# Patient Record
Sex: Female | Born: 1965 | Race: Black or African American | Hispanic: No | Marital: Married | Smoking: Former smoker
Health system: Southern US, Community
[De-identification: ages and names within clinical notes are randomized; demographics above are authoritative.]

## PROBLEM LIST (undated history)

## (undated) DIAGNOSIS — Z9221 Personal history of antineoplastic chemotherapy: Secondary | ICD-10-CM

## (undated) DIAGNOSIS — E119 Type 2 diabetes mellitus without complications: Secondary | ICD-10-CM

## (undated) DIAGNOSIS — Z923 Personal history of irradiation: Secondary | ICD-10-CM

## (undated) DIAGNOSIS — I10 Essential (primary) hypertension: Secondary | ICD-10-CM

## (undated) DIAGNOSIS — C801 Malignant (primary) neoplasm, unspecified: Secondary | ICD-10-CM

## (undated) DIAGNOSIS — I499 Cardiac arrhythmia, unspecified: Secondary | ICD-10-CM

## (undated) DIAGNOSIS — M1990 Unspecified osteoarthritis, unspecified site: Secondary | ICD-10-CM

## (undated) DIAGNOSIS — E669 Obesity, unspecified: Secondary | ICD-10-CM

## (undated) DIAGNOSIS — K5720 Diverticulitis of large intestine with perforation and abscess without bleeding: Secondary | ICD-10-CM

## (undated) HISTORY — DX: Essential (primary) hypertension: I10

## (undated) HISTORY — DX: Malignant (primary) neoplasm, unspecified: C80.1

## (undated) HISTORY — PX: BREAST CYST ASPIRATION: SHX578

## (undated) HISTORY — DX: Diverticulitis of large intestine with perforation and abscess without bleeding: K57.20

## (undated) HISTORY — DX: Obesity, unspecified: E66.9

## (undated) HISTORY — PX: ANKLE SURGERY: SHX546

---

## 2005-01-05 ENCOUNTER — Ambulatory Visit: Payer: Self-pay | Admitting: Obstetrics and Gynecology

## 2005-10-13 ENCOUNTER — Ambulatory Visit: Payer: Self-pay | Admitting: Obstetrics and Gynecology

## 2005-12-20 ENCOUNTER — Ambulatory Visit: Payer: Self-pay | Admitting: Obstetrics and Gynecology

## 2008-11-10 ENCOUNTER — Ambulatory Visit: Payer: Self-pay | Admitting: Internal Medicine

## 2010-12-13 ENCOUNTER — Ambulatory Visit: Payer: Self-pay | Admitting: Internal Medicine

## 2012-05-01 ENCOUNTER — Other Ambulatory Visit: Payer: Self-pay | Admitting: Radiology

## 2012-05-02 ENCOUNTER — Ambulatory Visit: Payer: Self-pay | Admitting: Internal Medicine

## 2012-11-07 ENCOUNTER — Emergency Department: Payer: Self-pay | Admitting: Emergency Medicine

## 2012-11-07 ENCOUNTER — Ambulatory Visit: Payer: Self-pay | Admitting: Family Medicine

## 2013-08-11 ENCOUNTER — Emergency Department: Payer: Self-pay | Admitting: Emergency Medicine

## 2014-11-20 LAB — HM PAP SMEAR: HM Pap smear: NEGATIVE

## 2015-11-10 ENCOUNTER — Encounter: Payer: Self-pay | Admitting: Family Medicine

## 2015-11-10 MED ORDER — TRIAMTERENE-HCTZ 37.5-25 MG PO TABS: 1.0000 | ORAL_TABLET | Freq: Every day | ORAL | Status: DC

## 2015-12-24 ENCOUNTER — Telehealth: Payer: Self-pay

## 2015-12-24 MED ORDER — TRIAMTERENE-HCTZ 37.5-25 MG PO TABS: 1.0000 | ORAL_TABLET | Freq: Every day | ORAL | Status: DC

## 2015-12-24 MED ORDER — AMLODIPINE BESYLATE 5 MG PO TABS: 5.0000 mg | ORAL_TABLET | Freq: Every day | ORAL | Status: DC

## 2015-12-24 NOTE — Telephone Encounter (Signed)
Patient called requesting refills on Amlodipine 5 mg qd and Triamterene-HCTZ 37.5-25 qd.  Patient has not been seen at [facility] since 02/26/2015.  I explained to patient that she would need appt to get med refills but she wanted me to send message first.  She uses [place]

## 2015-12-24 NOTE — Telephone Encounter (Signed)
Pt will need an appt for refills but I have given 30 day supply. Thanks! AK

## 2015-12-24 NOTE — Telephone Encounter (Signed)
Left message for patient that med were refilled for 1 month and she will need to make appt. A.Darneshia Demary

## 2016-01-05 ENCOUNTER — Ambulatory Visit (INDEPENDENT_AMBULATORY_CARE_PROVIDER_SITE_OTHER): Payer: Self-pay | Admitting: Family Medicine

## 2016-01-05 VITALS — BP 137/67 | HR 90 | Temp 98.7°F | Resp 16 | Ht 67.0 in | Wt 266.2 lb

## 2016-01-05 DIAGNOSIS — E785 Hyperlipidemia, unspecified: Secondary | ICD-10-CM

## 2016-01-05 DIAGNOSIS — I10 Essential (primary) hypertension: Secondary | ICD-10-CM

## 2016-01-05 DIAGNOSIS — J301 Allergic rhinitis due to pollen: Secondary | ICD-10-CM

## 2016-01-05 MED ORDER — FLUTICASONE PROPIONATE 50 MCG/ACT NA SUSP: 2.0000 | Freq: Every day | NASAL | Status: DC

## 2016-01-05 MED ORDER — AMLODIPINE BESYLATE 5 MG PO TABS: 5.0000 mg | ORAL_TABLET | Freq: Every day | ORAL | Status: DC

## 2016-01-05 MED ORDER — TRIAMTERENE-HCTZ 37.5-25 MG PO TABS: 1.0000 | ORAL_TABLET | Freq: Every day | ORAL | Status: DC

## 2016-01-05 NOTE — Patient Instructions (Signed)
Your goal blood pressure is 140/90 Work on low salt/sodium diet - goal <1.5gm (1,500mg) per day. Eat a diet high in fruits/vegetables and whole grains.  Look into mediterranean and DASH diet. Goal activity is 150min/wk of moderate intensity exercise.  This can be split into 30 minute chunks.  If you are not at this level, you can start with smaller 10-15 min increments and slowly build up activity. Look at www.heart.org for more resources  Please seek immediate medical attention at ER or Urgent Care if you develop: Chest pain, pressure or tightness. Shortness of breath accompanied by nausea or diaphoresis Visual changes Numbness or tingling on one side of the body Facial droop Altered mental status Or any concerning symptoms.   

## 2016-01-05 NOTE — Assessment & Plan Note (Signed)
Controlled. Renewed medications. Check CMET. Dash diet reviewed. Encouraged daily exercise. Recheck 6 mos.

## 2016-01-05 NOTE — Progress Notes (Signed)
Subjective:    Patient ID: Chelsea Hudson, female    DOB: 19-Mar-1966, 50 y.o.   MRN: [id number]  HPI: Chelsea Hudson is a 50 y.o. female presenting on 01/05/2016 for Hypertension   HPI  Pt presents for hypertension recheck. Needs refills on her medications. Doing well at home. No HA, SOB, or visual changes. Doing well with medications. Does not check BP at home.  Pt also reporting sinus drainage and increasing loss of taste over the past year. A family member had surgery on her sinuses to correct this- pt is wondering if she is a candidate. Pt does not take flonase on a regular basis.    No past medical history on file.  No current outpatient prescriptions on file prior to visit.   No current facility-administered medications on file prior to visit.    Review of Systems  Constitutional: Negative for fever and chills.  HENT: Negative.   Respiratory: Negative for cough, chest tightness and wheezing.   Cardiovascular: Negative for chest pain and leg swelling.  Gastrointestinal: Negative for nausea, vomiting, abdominal pain, diarrhea and constipation.  Endocrine: Negative.  Negative for cold intolerance, heat intolerance, polydipsia, polyphagia and polyuria.  Genitourinary: Negative for dysuria and difficulty urinating.  Musculoskeletal: Negative.   Neurological: Negative for dizziness, light-headedness and numbness.  Psychiatric/Behavioral: Negative.    Per HPI unless specifically indicated above     Objective:    BP 137/67 mmHg  Pulse 90  Temp(Src) 98.7 F (37.1 C) (Oral)  Resp 16  Ht 5\' 7"  (1.702 m)  Wt 266 lb 3.2 oz (120.748 kg)  BMI 41.68 kg/m2  SpO2 100%  Wt Readings from Last 3 Encounters:  01/05/16 266 lb 3.2 oz (120.748 kg)    Physical Exam  Constitutional: She is oriented to person, place, and time. She appears well-developed and well-nourished.  HENT:  Head: Normocephalic and atraumatic.  Neck: Neck supple.  Cardiovascular: Normal rate, regular rhythm  and normal heart sounds.  Exam reveals no gallop and no friction rub.   No murmur heard. Pulmonary/Chest: Effort normal and breath sounds normal. She has no wheezes. She exhibits no tenderness.  Abdominal: Soft. Normal appearance and bowel sounds are normal. She exhibits no distension and no mass. There is no tenderness. There is no rebound and no guarding.  Musculoskeletal: Normal range of motion. She exhibits no edema or tenderness.  Lymphadenopathy:    She has no cervical adenopathy.  Neurological: She is alert and oriented to person, place, and time.  Skin: Skin is warm and dry.   Results for orders placed or performed in visit on 01/05/16  HM PAP SMEAR  Result Value Ref Range   HM Pap smear negative       Assessment & Plan:   Problem List Items Addressed This Visit      Cardiovascular and Mediastinum   Hypertension - Primary    Controlled. Renewed medications. Check CMET. Dash diet reviewed. Encouraged daily exercise. Recheck 6 mos.       Relevant Medications   triamterene-hydrochlorothiazide (MAXZIDE-25) 37.5-25 MG tablet   amLODipine (NORVASC) 5 MG tablet   Other Relevant Orders   Comprehensive Metabolic Panel (CMET)    Other Visit Diagnoses    Mild hyperlipidemia        Relevant Medications    triamterene-hydrochlorothiazide (MAXZIDE-25) 37.5-25 MG tablet    amLODipine (NORVASC) 5 MG tablet    Other Relevant Orders    Lipid Profile    Allergic rhinitis due to pollen  Trial of flonase for sinus symptoms. Consider referral to ENT if symptoms not improving.     Relevant Medications    fluticasone (FLONASE) 50 MCG/ACT nasal spray       Meds ordered this encounter  Medications  . triamterene-hydrochlorothiazide (MAXZIDE-25) 37.5-25 MG tablet    Sig: Take 1 tablet by mouth daily.    Dispense:  90 tablet    Refill:  3    Patient must schedule office visit no refills. Last office visit 02/2015.    Order Specific Question:  Supervising Provider    Answer:   Arlis Porta [id number]  . amLODipine (NORVASC) 5 MG tablet    Sig: Take 1 tablet (5 mg total) by mouth daily.    Dispense:  90 tablet    Refill:  3    Order Specific Question:  Supervising Provider    Answer:  Arlis Porta [phone]  . fluticasone (FLONASE) 50 MCG/ACT nasal spray    Sig: Place 2 sprays into both nostrils daily.    Dispense:  16 g    Refill:  11    Order Specific Question:  Supervising Provider    Answer:  Arlis Porta [phone]      Follow up plan: Return in about 6 months (around 07/04/2016) for HTN.

## 2016-02-10 ENCOUNTER — Telehealth: Payer: Self-pay

## 2016-02-10 NOTE — Telephone Encounter (Signed)
Called to request weight loss info gave [employer] info. [facility]

## 2016-04-21 ENCOUNTER — Ambulatory Visit (INDEPENDENT_AMBULATORY_CARE_PROVIDER_SITE_OTHER): Payer: Self-pay | Admitting: Obstetrics and Gynecology

## 2016-04-21 ENCOUNTER — Encounter: Payer: Self-pay | Admitting: Obstetrics and Gynecology

## 2016-04-21 VITALS — BP 130/77 | HR 91 | Ht 66.0 in | Wt 265.0 lb

## 2016-04-21 DIAGNOSIS — E669 Obesity, unspecified: Secondary | ICD-10-CM

## 2016-04-21 MED ORDER — PHENTERMINE HCL 37.5 MG PO TABS: 37.5000 mg | ORAL_TABLET | Freq: Every day | ORAL | Status: DC

## 2016-04-21 MED ORDER — CYANOCOBALAMIN 1000 MCG/ML IJ SOLN: 1000.0000 ug | Freq: Once | INTRAMUSCULAR | Status: DC

## 2016-04-21 NOTE — Progress Notes (Signed)
Subjective:  Chelsea Hudson is a 50 y.o. G0P0 at Unknown being seen today for weight loss management- initial visit.  Patient reports General ROS: negative and reports previous weight loss attempts: exercising at the gym and lost 50# and has kept it off. Works as a dump Training and development officer.   The following portions of the patient's history were reviewed and updated as appropriate: allergies, current medications, past family history, past medical history, past social history, past surgical history and problem list.   Objective:   Filed Vitals:   04/21/16 1510  BP: 130/77  Pulse: 91  Height: 5\' 6"  (1.676 m)  Weight: 265 lb (120.203 kg)    General:  Alert, oriented and cooperative. Patient is in no acute distress.  :   :   :   :   :   :   PE: Well groomed female in no current distress,   Mental Status: Normal mood and affect. Normal behavior. Normal judgment and thought content.   Current BMI: Body mass index is 42.79 kg/(m^2).   Assessment and Plan:  Obesity  1. Obesity   Plan: low carb, High protein diet RX for adipex 37.5 mg daily and B12 1068mcg.ml monthly, to start now with first injection given at today's visit. Reviewed side-effects common to both medications and expected outcomes. Increase daily water intake to at least 8 bottle a day, every day.  Goal is to reduse weight by 10% by end of three months, and will re-evaluate then.  RTC in 4 weeks for Nurse visit to check weight & BP, and get next B12 injections.    Please refer to After Visit Summary for other counseling recommendations.    Brelyn Woehl N Morris, CNM   Payal Stanforth [employer], CNM      Consider the Low Glycemic Index Diet and 6 smaller meals daily .  This boosts your metabolism and regulates your sugars:   Use the protein bar by [employer]kins because they have lots of fiber in them  Find the low carb flatbreads, tortillas and pita breads for sandwiches:  Joseph's makes a pita bread and a flat bread  , available at [facility] and [employer]; [place] makes a low carb flatbread available at [employer] and HT that is 9 net carbs and 100 cal [place] makes a low carb whole wheat tortilla available at [employer] most grocery stores with 6 net carbs and 210 cal  [place] yogurt can still have a lot of carbs .  Dannon Light N fit has 80 cal and 8 carbs

## 2016-05-19 ENCOUNTER — Ambulatory Visit (INDEPENDENT_AMBULATORY_CARE_PROVIDER_SITE_OTHER): Payer: Self-pay | Admitting: Obstetrics and Gynecology

## 2016-05-19 VITALS — BP 119/84 | HR 92 | Wt 254.6 lb

## 2016-05-19 DIAGNOSIS — E669 Obesity, unspecified: Secondary | ICD-10-CM

## 2016-05-19 MED ORDER — CYANOCOBALAMIN 1000 MCG/ML IJ SOLN
1000.0000 ug | Freq: Once | INTRAMUSCULAR | Status: AC
  Administered 2016-05-19: 1000 ug via INTRAMUSCULAR

## 2016-05-19 NOTE — Progress Notes (Signed)
Pt is here for wt, bp check, b-12 inj Pt has done awesome, she is very pleased with her weight loss, denies any s/e  04/21/16 wt- 254.6

## 2016-06-16 ENCOUNTER — Ambulatory Visit (INDEPENDENT_AMBULATORY_CARE_PROVIDER_SITE_OTHER): Payer: Self-pay | Admitting: Obstetrics and Gynecology

## 2016-06-16 VITALS — BP 116/88 | HR 108 | Ht 66.0 in | Wt 247.6 lb

## 2016-06-16 DIAGNOSIS — R5383 Other fatigue: Secondary | ICD-10-CM

## 2016-06-16 DIAGNOSIS — E669 Obesity, unspecified: Secondary | ICD-10-CM

## 2016-06-16 MED ORDER — CYANOCOBALAMIN 1000 MCG/ML IJ SOLN
1000.0000 ug | Freq: Once | INTRAMUSCULAR | Status: AC
  Administered 2016-06-16: 1000 ug via INTRAMUSCULAR

## 2016-06-19 NOTE — Progress Notes (Signed)
Filed Weights   06/16/16 1640  Weight: 247 lb 9.6 oz (112.3 kg)   Pt presents for weight management with Adipex, pt is doing well and only notes dry mouth as a side effect. Pt day with a weight loss of 8lbs. Encouraged pt to keep up the good work, make healthy food choices, and add exercise. Pt to RTC in 1 month.

## 2016-07-21 ENCOUNTER — Ambulatory Visit (INDEPENDENT_AMBULATORY_CARE_PROVIDER_SITE_OTHER): Payer: Self-pay | Admitting: Obstetrics and Gynecology

## 2016-07-21 VITALS — BP 131/83 | HR 93 | Ht 66.0 in | Wt 243.0 lb

## 2016-07-21 DIAGNOSIS — E669 Obesity, unspecified: Secondary | ICD-10-CM

## 2016-07-21 DIAGNOSIS — R5383 Other fatigue: Secondary | ICD-10-CM

## 2016-07-21 MED ORDER — CYANOCOBALAMIN 1000 MCG/ML IJ SOLN
1000.0000 ug | Freq: Once | INTRAMUSCULAR | Status: AC
  Administered 2016-07-21: 1000 ug via INTRAMUSCULAR

## 2016-07-21 NOTE — Patient Instructions (Signed)

## 2016-07-21 NOTE — Progress Notes (Signed)
Filed Weights   07/21/16 1647  Weight: 243 lb (110.2 kg)   Pt presents for weight management, pt today with 4LB weight loss. Pt denies complaints or side effects of medication. Pt states that she is hoping to begin exercising soon. Encouraged pt to do so, begin with brisk walk and increase. Pt to follow up in 4wks for next appointment.

## 2016-08-18 ENCOUNTER — Ambulatory Visit (INDEPENDENT_AMBULATORY_CARE_PROVIDER_SITE_OTHER): Payer: Self-pay | Admitting: Obstetrics and Gynecology

## 2016-08-18 VITALS — BP 119/79 | HR 98 | Ht 66.0 in | Wt 244.0 lb

## 2016-08-18 DIAGNOSIS — R5383 Other fatigue: Secondary | ICD-10-CM

## 2016-08-18 DIAGNOSIS — E669 Obesity, unspecified: Secondary | ICD-10-CM

## 2016-08-18 MED ORDER — CYANOCOBALAMIN 1000 MCG/ML IJ SOLN
1000.0000 ug | Freq: Once | INTRAMUSCULAR | Status: AC
  Administered 2016-08-18: 1000 ug via INTRAMUSCULAR

## 2016-08-18 MED ORDER — PHENTERMINE HCL 37.5 MG PO TABS: 37.5000 mg | ORAL_TABLET | Freq: Every day | ORAL | 2 refills | Status: DC

## 2016-08-18 NOTE — Progress Notes (Signed)
Patient ID: Chelsea Hudson, female   DOB: 10-24-1966, 50 y.o.   MRN: [id number]  Pt here to be evaluated for restarting phentermine. Has been off of medication x1 month and gained 1 lb. Was previously doing well losing weight on phentermine. MNS agreed to restart phentermine, rx given with 2 refills.

## 2016-09-15 ENCOUNTER — Ambulatory Visit: Payer: Self-pay

## 2016-09-18 ENCOUNTER — Ambulatory Visit (INDEPENDENT_AMBULATORY_CARE_PROVIDER_SITE_OTHER): Payer: Self-pay | Admitting: Obstetrics and Gynecology

## 2016-09-18 VITALS — BP 124/83 | HR 93 | Ht 66.0 in | Wt 239.8 lb

## 2016-09-18 DIAGNOSIS — E6609 Other obesity due to excess calories: Secondary | ICD-10-CM

## 2016-09-18 MED ORDER — CYANOCOBALAMIN 1000 MCG/ML IJ SOLN
1000.0000 ug | Freq: Once | INTRAMUSCULAR | Status: AC
  Administered 2016-09-18: 1000 ug via INTRAMUSCULAR

## 2016-09-18 NOTE — Progress Notes (Signed)
Pt presents for wt, bp, and b12. She is down 5#. NO s/e. F/u with MNS in 4 weeks.

## 2016-10-19 ENCOUNTER — Ambulatory Visit (INDEPENDENT_AMBULATORY_CARE_PROVIDER_SITE_OTHER): Payer: Self-pay | Admitting: Obstetrics and Gynecology

## 2016-10-19 VITALS — BP 113/87 | HR 98 | Ht 66.0 in | Wt 237.4 lb

## 2016-10-19 DIAGNOSIS — Z6838 Body mass index (BMI) 38.0-38.9, adult: Secondary | ICD-10-CM

## 2016-10-19 DIAGNOSIS — E6609 Other obesity due to excess calories: Secondary | ICD-10-CM

## 2016-10-19 MED ORDER — CYANOCOBALAMIN 1000 MCG/ML IJ SOLN
1000.0000 ug | Freq: Once | INTRAMUSCULAR | Status: AC
  Administered 2016-10-19: 1000 ug via INTRAMUSCULAR

## 2016-10-19 NOTE — Progress Notes (Signed)
Patient ID: Chelsea Hudson, female   DOB: 03-05-1966, 50 y.o.   MRN: [id number]  Pt presents for weight, B/P, B-12 injection. No side effects of medication-Phentermine, or B-12.  Weight loss of _2_ lbs. Encouraged eating healthy and exercise.

## 2016-11-20 DIAGNOSIS — C50919 Malignant neoplasm of unspecified site of unspecified female breast: Secondary | ICD-10-CM

## 2016-11-20 HISTORY — DX: Malignant neoplasm of unspecified site of unspecified female breast: C50.919

## 2016-11-23 ENCOUNTER — Encounter: Payer: Self-pay | Admitting: Obstetrics and Gynecology

## 2017-01-31 ENCOUNTER — Ambulatory Visit: Payer: Self-pay | Attending: Oncology

## 2017-01-31 VITALS — BP 121/80 | HR 91 | Temp 97.7°F

## 2017-01-31 DIAGNOSIS — N63 Unspecified lump in unspecified breast: Secondary | ICD-10-CM

## 2017-01-31 DIAGNOSIS — Z0000 Encounter for general adult medical examination without abnormal findings: Secondary | ICD-10-CM

## 2017-01-31 NOTE — Progress Notes (Addendum)
Subjective:     Patient ID: Chelsea Hudson, female   DOB: 1966-05-07, 51 y.o.   MRN: [id number]  HPI   Review of Systems     Objective:   Physical Exam  Pulmonary/Chest: Right breast exhibits inverted nipple and mass. Right breast exhibits no nipple discharge, no skin change and no tenderness. Left breast exhibits no inverted nipple, no mass, no nipple discharge, no skin change and no tenderness. Breasts are symmetrical.    Right nipple inversion, per patient normal  Genitourinary: No labial fusion. There is no rash, tenderness, lesion or injury on the right labia. There is no rash, tenderness, lesion or injury on the left labia. Uterus is not deviated, not enlarged, not fixed and not tender. Cervix exhibits friability. Cervix exhibits no motion tenderness and no discharge. Right adnexum displays no mass, no tenderness and no fullness. Left adnexum displays no mass, no tenderness and no fullness. No erythema, tenderness or bleeding in the vagina. No foreign body in the vagina. No signs of injury around the vagina. No vaginal discharge found.       Assessment:     51 year old patient presents for [facility] clinic visit.  Patient screened, and meets BCCCP eligibility.  Patient does not have insurance, Medicare or Medicaid.  Handout given on Affordable Care Act.Instructed patient on breast self-exam using teach back method.  Patient reports she has a right breast abnormality that she has noticed for past 3 days.  CBE reveals a linear firm 3.5 cm mass right upper breast with indentation.  Denies tenderness.  Pelvic exam normal.  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    Plan:     Jeanella Anton to schedule patient for bilateral diagnostic mammogram, and ultrasound .  Orders are in.  Specimen collected for pap.

## 2017-01-31 NOTE — Addendum Note (Signed)
Addended byAl Pimple F on: 01/31/2017 05:00 PM   Modules accepted: Orders

## 2017-02-02 LAB — PAP LB AND HPV HIGH-RISK
HPV, high-risk: POSITIVE — AB
PAP SMEAR COMMENT: 0

## 2017-02-09 ENCOUNTER — Ambulatory Visit
Admission: RE | Admit: 2017-02-09 | Discharge: 2017-02-09 | Disposition: A | Discharge status: Home / Self Care | Payer: Self-pay | Source: Ambulatory Visit | Attending: Oncology | Admitting: Oncology

## 2017-02-09 DIAGNOSIS — N63 Unspecified lump in unspecified breast: Secondary | ICD-10-CM

## 2017-02-12 ENCOUNTER — Other Ambulatory Visit: Payer: Self-pay | Admitting: *Deleted

## 2017-02-12 DIAGNOSIS — N63 Unspecified lump in unspecified breast: Secondary | ICD-10-CM

## 2017-02-13 ENCOUNTER — Other Ambulatory Visit: Payer: Self-pay | Admitting: *Deleted

## 2017-02-13 DIAGNOSIS — N63 Unspecified lump in unspecified breast: Secondary | ICD-10-CM

## 2017-02-28 ENCOUNTER — Ambulatory Visit
Admission: RE | Admit: 2017-02-28 | Discharge: 2017-02-28 | Disposition: A | Discharge status: Home / Self Care | Payer: Medicaid Other | Source: Ambulatory Visit | Attending: Oncology | Admitting: Oncology

## 2017-02-28 DIAGNOSIS — N63 Unspecified lump in unspecified breast: Secondary | ICD-10-CM

## 2017-02-28 DIAGNOSIS — C801 Malignant (primary) neoplasm, unspecified: Secondary | ICD-10-CM

## 2017-02-28 HISTORY — DX: Malignant (primary) neoplasm, unspecified: C80.1

## 2017-02-28 HISTORY — PX: BREAST BIOPSY: SHX20

## 2017-03-02 NOTE — Progress Notes (Signed)
Notified patient of postive breast biopsy results.  Scheduled her to see Dr. Bary Castilla 03/06/17 at 4:45.  To be scheduled for Oncology Consult.  Patient is coming in on 03/07/17 at 4:30 to fill out [facility] paperwork.  Phoned patient with appointment. Notified of negative /positive pap results , and need for repeat pap in one year.

## 2017-03-05 DIAGNOSIS — Z7189 Other specified counseling: Secondary | ICD-10-CM | POA: Insufficient documentation

## 2017-03-05 DIAGNOSIS — Z170 Estrogen receptor positive status [ER+]: Secondary | ICD-10-CM

## 2017-03-05 DIAGNOSIS — Z853 Personal history of malignant neoplasm of breast: Secondary | ICD-10-CM | POA: Insufficient documentation

## 2017-03-05 DIAGNOSIS — C50411 Malignant neoplasm of upper-outer quadrant of right female breast: Secondary | ICD-10-CM | POA: Insufficient documentation

## 2017-03-05 NOTE — Progress Notes (Signed)
  Oncology Nurse Navigator Documentation  Navigator Location: [facility] Onc (03/05/17 1600) Referral date to RadOnc/MedOnc: 03/06/17 (03/05/17 1600) )Navigator Encounter Type: Telephone (03/05/17 1600) Telephone: Lahoma Crocker Call;Appt Confirmation/Clarification;Education;Financial Assistance (03/05/17 1600)                     Treatment Phase: Pre-Tx/Tx Discussion (03/05/17 1600) Barriers/Navigation Needs: Coordination of Care (03/05/17 1600) Education: Accessing Care/ Finding Providers (03/05/17 1600) Interventions: Coordination of Care (03/05/17 1600)   Coordination of Care: Appts (03/05/17 1600)                  Time Spent with Patient: 60 (03/05/17 1600)   Phoned patient with Oncology Consult appointment options.  She is coming in on 03/06/17 at 0830 to see Dr. Grayland Ormond.  Breast Cancer Treatment HAndbook/folder with hospital services  given to [facility] RN to give to patient.  [place] paperwork prepared and given to [facility] for patient to sign.

## 2017-03-05 NOTE — Progress Notes (Signed)
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  INTERVAL HISTORY: Patient is a 51 year old female who noted an enlarging mass in her right breast. Subsequent mammogram, ultrasound, biopsy revealed the above stated breast cancer. She is anxious, but otherwise feels well. She has no neurologic complaints. She denies any recent fevers or illnesses. She has a good appetite and denies weight loss. She has no chest pain or shortness of breath. She denies any nausea, vomiting, constipation, or diarrhea. She has no urinary complaints. Patient otherwise feels well and offers no further specific complaints.  REVIEW OF SYSTEMS:   Review of Systems  Constitutional: Negative.  Negative for fever, malaise/fatigue and weight loss.  Respiratory: Negative.  Negative for cough.   Cardiovascular: Negative.  Negative for chest pain and leg swelling.  Gastrointestinal: Negative.  Negative for abdominal pain.  Genitourinary: Negative.   Musculoskeletal: Negative.   Skin: Negative.  Negative for rash.  Neurological: Negative.  Negative for weakness.  Psychiatric/Behavioral: The patient is nervous/anxious.     As per HPI. Otherwise, a complete review of systems is negative.  PAST MEDICAL HISTORY: Past Medical History:  Diagnosis Date  . Arthritis    SHOULDER  . Cancer ([place]) 02/28/2017   INVASIVE MAMMARY CARCINOMA WITH MUCINOUS FEATURES.  . Hypertension   . Irregular heart beat    PT STATES IT "SKIPS A BEAT"   . Obesity     PAST SURGICAL HISTORY: Past Surgical History:  Procedure  Laterality Date  . ANKLE SURGERY    . BREAST BIOPSY Right 02/28/2017   INVASIVE MAMMARY CARCINOMA WITH MUCINOUS FEATURES.  Marland Kitchen BREAST CYST ASPIRATION Right    NEG    FAMILY HISTORY: Family History  Problem Relation Age of Onset  . Diabetes Father   . Stroke Father   . Breast cancer Mother 76  . Brain cancer Maternal Aunt 60  . Colon cancer Neg Hx     ADVANCED DIRECTIVES (Y/N):  N  HEALTH MAINTENANCE: Social History  Substance Use Topics  . Smoking status: Current Every Day Smoker    Packs/day: 0.50    Years: 25.00    Types: Cigarettes  . Smokeless tobacco: Never Used  . Alcohol use Yes     Comment: occas     Colonoscopy:  PAP:  Bone density:  Lipid panel:  Allergies  Allergen Reactions  . Other Hives and Itching    [name]     Current Outpatient Prescriptions  Medication Sig Dispense Refill  . amLODipine (NORVASC) 5 MG tablet Take 1 tablet (5 mg total) by mouth daily. (Patient taking differently: Take 5 mg by mouth every morning. ) 90 tablet 3  . triamterene-hydrochlorothiazide (MAXZIDE-25) 37.5-25 MG tablet Take 1 tablet by mouth daily. 90 tablet 3  . acetaminophen (TYLENOL) 500 MG tablet Take 1,000 mg by mouth every 6 (six) hours as needed for mild pain.     No current facility-administered medications for this visit.     OBJECTIVE: Vitals:   03/06/17 0834  BP: Marland Kitchen)  147/95  Pulse: 80  Resp: 18  Temp: 98.6 F (37 C)     Body mass index is 40.43 kg/m.    ECOG FS:0 - Asymptomatic  General: Well-developed, well-nourished, no acute distress. Eyes: Pink conjunctiva, anicteric sclera. HEENT: Normocephalic, moist mucous membranes, clear oropharnyx. Breasts: Patient requested exam be deferred today. Lungs: Clear to auscultation bilaterally. Heart: Regular rate and rhythm. No rubs, murmurs, or gallops. Abdomen: Soft, nontender, nondistended. No organomegaly  noted, normoactive bowel sounds. Musculoskeletal: No edema, cyanosis, or clubbing. Neuro: Alert, answering all questions appropriately. Cranial nerves grossly intact. Skin: No rashes or petechiae noted. Psych: Normal affect. Lymphatics: No cervical, calvicular, axillary or inguinal LAD.   LAB RESULTS:  No results found for: NA, K, CL, CO2, GLUCOSE, BUN, CREATININE, CALCIUM, PROT, ALBUMIN, AST, ALT, ALKPHOS, BILITOT, GFRNONAA, GFRAA  No results found for: WBC, NEUTROABS, HGB, HCT, MCV, PLT   STUDIES: Nm Pet Image Initial (pi) Skull Base To Thigh  Result Date: 03/09/2017 CLINICAL DATA:  Initial treatment strategy for right breast cancer. EXAM: NUCLEAR MEDICINE PET SKULL BASE TO THIGH TECHNIQUE: 12.4 mCi F-18 FDG was injected intravenously. Full-ring PET imaging was performed from the skull base to thigh after the radiotracer. CT data was obtained and used for attenuation correction and anatomic localization. FASTING BLOOD GLUCOSE:  Value: 118 mg/dl COMPARISON:  Multiple exams, including CT abdomen from 05/02/2012 FINDINGS: NECK No hypermetabolic lymph nodes in the neck. Mild chronic left maxillary sinusitis. Low-level fairly symmetric activity in the palate and tonsils is highly likely to be physiologic. CHEST Hypermetabolic right axillary adenopathy. Index right axillary node 2.3 cm in short axis on image 67/3, maximum SUV 24.0. A separate right axillary/subpectoral lymph node adjacent to the axillary neurovascular structures measures 1.2 cm in short axis on image 51/ 3 and has a maximum SUV of 6.0, compatible with malignant involvement. There are other small subpectoral lymph nodes on the right which only have low-grade activity but merit surveillance. In the upper portion of the right breast there some faint bandlike opacity adjacent to a small clip, maximum SUV in this vicinity 3.7, likely a postoperative region. No obvious worrisome lung nodules on the CT data. Upper normal size paratracheal and  AP window lymph nodes do not demonstrate metabolic activity above the blood pool. ABDOMEN/PELVIS No abnormal hypermetabolic activity within the liver, pancreas, adrenal glands, or spleen. No hypermetabolic lymph nodes in the abdomen or pelvis. A 9 cm segment of the sigmoid colon demonstrates mildly accentuated metabolic activity, particularly proximally where the maximum SUV is 8.9. There is evidence of scattered diverticula and possibly some subtle inflammation along its proximal region, for example images 200-207 of series 3. This may reflect mild diverticulitis but sigmoid colon tumor is not specifically excluded given this constellation of findings. Small retroperitoneal and pelvic lymph nodes are not pathologically enlarged or hypermetabolic. SKELETON No focal hypermetabolic activity to suggest skeletal metastasis. IMPRESSION: 1. Hypermetabolic right axillary/subpectoral adenopathy. Low-grade activity in the right upper breast likely at the postoperative site. Appearance compatible with metastatic spread to right axillary/subpectoral lymph nodes. 2. There is some sigmoid colon diverticulosis, with faint inflammatory findings adjacent to the sigmoid colon proximally, and with accentuated activity in the involved segment of the sigmoid colon. This is probably inflammatory activity. Strictly speaking, malignancy in the sigmoid colon could have a similar appearance. Following any therapy for potential mild diverticulitis, consider colonoscopy if not recently performed in order to further assess the sigmoid colon. 3.  Aortic Atherosclerosis (ICD10-I70.0). 4. Mild chronic left maxillary  sinusitis. Electronically Signed   By: Van Clines M.D.   On: 03/09/2017 10:03   Mm Clip Placement Right  Result Date: 02/28/2017 CLINICAL DATA:  Status post ultrasound-guided biopsy of a right breast mass at 11 o'clock and of a right axillary lymph nodes EXAM: DIAGNOSTIC RIGHT MAMMOGRAM POST ULTRASOUND BIOPSY COMPARISON:   Previous exam(s). FINDINGS: Mammographic images were obtained following ultrasound guided biopsy of a suspicious right breast mass at 11 o'clock and of a right axillary lymph node. Post biopsy mammogram demonstrates the wing shaped biopsy marker to be within the right breast mass at 11 o'clock and the [facility] shape 4 butterfly marker to be in the expected location of the enlarged right axillary lymph node. IMPRESSION: Appropriate marker position as above. Final Assessment: Post Procedure Mammograms for Marker Placement Electronically Signed   By: Pamelia Hoit M.D.   On: 02/28/2017 09:22   [place] Rt Breast Bx W Loc Dev 1st Lesion Img Bx Spec US Guide  Result Date: 02/28/2017 CLINICAL DATA:  51 year old female with suspicious right breast mass at 11 o'clock, 8 cm from the nipple EXAM: ULTRASOUND GUIDED RIGHT BREAST CORE NEEDLE BIOPSY COMPARISON:  Previous exam(s). FINDINGS: I met with the patient and we discussed the procedure of ultrasound-guided biopsy, including benefits and alternatives. We discussed the high likelihood of a successful procedure. We discussed the risks of the procedure, including infection, bleeding, tissue injury, clip migration, and inadequate sampling. Informed written consent was given. The usual time-out protocol was performed immediately prior to the procedure. Lesion quadrant: Upper outer quadrant Using sterile technique and 1% Lidocaine as local anesthetic, under direct ultrasound visualization, a 12 gauge spring-loaded device was used to perform biopsy of a suspicious right breast mass at 11 o'clock, 8 cm from the nipple using a lateral to medial approach. At the conclusion of the procedure a wing shaped tissue marker clip was deployed into the biopsy cavity. Follow up 2 view mammogram was performed and dictated separately. IMPRESSION: Ultrasound guided biopsy of a suspicious right breast mass at 11 o'clock, 8 cm from the nipple. No apparent complications. Electronically Signed   By: Pamelia Hoit M.D.   On: 02/28/2017 09:19   [place] Rt Breast Bx W Loc Dev Ea Add Lesion Img Bx Spec US Guide  Result Date: 02/28/2017 CLINICAL DATA:  51 year old female with suspicious right axillary lymph node EXAM: ULTRASOUND GUIDED CORE NEEDLE BIOPSY OF A RIGHT AXILLARY NODE COMPARISON:  Previous exam(s). FINDINGS: I met with the patient and we discussed the procedure of ultrasound-guided biopsy, including benefits and alternatives. We discussed the high likelihood of a successful procedure. We discussed the risks of the procedure, including infection, bleeding, tissue injury, clip migration, and inadequate sampling. Informed written consent was given. The usual time-out protocol was performed immediately prior to the procedure. Using sterile technique and 1% Lidocaine as local anesthetic, under direct ultrasound visualization, a 14 gauge spring-loaded device was used to perform biopsy of a suspicious right axillary lymph node using a lateral to medial approach. At the conclusion of the procedure a HydroMARK shape 4 butterfly tissue marker clip was deployed into the biopsy cavity. Follow up 2 view mammogram was performed and dictated separately. IMPRESSION: Ultrasound guided biopsy of a suspicious right axillary lymph node. No apparent complications. Electronically Signed   By: Pamelia Hoit M.D.   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  PLAN:  1. 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 Her regimen will be determined later once her HER-2 results are finalized. PET scan completed after patient's appointment revealed no evidence of metastatic disease. Patient will require MUGA scan prior to initiating  treatment. After the completion of her neoadjuvant chemotherapy, she will require definitive surgery and possibly adjuvant XRT. Finally, given the ER/PR status of her tumor she will benefit from either tamoxifen or an aromatase inhibitor for total 5 years. Return to clinic on Mar 21, 2017 to initiate cycle 1 of neoadjuvant chemotherapy. 2. Genetic testing: Given patient's age of under 89, will order genetic testing at patient's next clinic visit.  Patient expressed understanding and was in agreement with this plan. She also understands that She can call clinic at any time with any questions, concerns, or complaints.   Cancer Staging Malignant neoplasm of upper-outer quadrant of right breast in female, estrogen receptor positive ([place]) Staging form: Breast, AJCC 8th Edition - Clinical stage from 03/05/2017: Stage IIA (cT2, cN2a, cM0, G2, ER: Positive, PR: Positive, HER2: Equivocal) - Signed by Lloyd Huger, MD on 03/05/2017   Lloyd Huger, MD   03/11/2017 10:50 PM

## 2017-03-05 NOTE — Progress Notes (Signed)
  Oncology Nurse Navigator Documentation  Navigator Location: [facility] (03/02/17 1625)   )Navigator Encounter Type: Introductory phone call (03/02/17 1625)   Abnormal Finding Date: 02/09/17 (03/02/17 1625) Confirmed Diagnosis Date: 02/28/17 (03/02/17 1625)               Patient Visit Type: Initial (03/02/17 1625) Treatment Phase: Pre-Tx/Tx Discussion (03/02/17 1625) Barriers/Navigation Needs: Education;Coordination of Care (03/02/17 1625) Education: Accessing Care/ Finding Providers;Understanding Cancer/ Treatment Options;Coping with Diagnosis/ Prognosis;Newly Diagnosed Cancer Education (03/02/17 1625) Interventions: Coordination of Care (03/02/17 1625)   Coordination of Care: Appts (03/02/17 1625)        Acuity: Level 2 (03/02/17 1625)   Acuity Level 2: Initial guidance, education and coordination as needed;Educational needs;Assistance expediting appointments (03/02/17 1625)  Patient screened through [facility].  Notified of positive pathology results.  Explained need for surgical, and oncology consultations.  Appointment schedule with Dr. Bary Castilla on 03/06/17 at 4:45.  Waiting to hear from scheduler on oncology consult.  Reviewed BCCM coverage with patient.  She is to come in to fill out paperwork  Wednesday afternoon 03/07/17.

## 2017-03-06 ENCOUNTER — Inpatient Hospital Stay: Payer: Medicaid Other | Attending: Oncology | Admitting: Oncology

## 2017-03-06 ENCOUNTER — Ambulatory Visit (INDEPENDENT_AMBULATORY_CARE_PROVIDER_SITE_OTHER): Payer: Medicaid Other | Admitting: General Surgery

## 2017-03-06 ENCOUNTER — Encounter: Payer: Self-pay | Admitting: General Surgery

## 2017-03-06 VITALS — BP 144/86 | HR 86 | Resp 14 | Ht 66.0 in | Wt 250.0 lb

## 2017-03-06 DIAGNOSIS — F17210 Nicotine dependence, cigarettes, uncomplicated: Secondary | ICD-10-CM | POA: Diagnosis not present

## 2017-03-06 DIAGNOSIS — Z170 Estrogen receptor positive status [ER+]: Secondary | ICD-10-CM | POA: Diagnosis not present

## 2017-03-06 DIAGNOSIS — K5730 Diverticulosis of large intestine without perforation or abscess without bleeding: Secondary | ICD-10-CM

## 2017-03-06 DIAGNOSIS — J320 Chronic maxillary sinusitis: Secondary | ICD-10-CM | POA: Insufficient documentation

## 2017-03-06 DIAGNOSIS — C50411 Malignant neoplasm of upper-outer quadrant of right female breast: Secondary | ICD-10-CM | POA: Insufficient documentation

## 2017-03-06 DIAGNOSIS — C773 Secondary and unspecified malignant neoplasm of axilla and upper limb lymph nodes: Secondary | ICD-10-CM | POA: Insufficient documentation

## 2017-03-06 DIAGNOSIS — M1990 Unspecified osteoarthritis, unspecified site: Secondary | ICD-10-CM | POA: Insufficient documentation

## 2017-03-06 DIAGNOSIS — I499 Cardiac arrhythmia, unspecified: Secondary | ICD-10-CM | POA: Insufficient documentation

## 2017-03-06 DIAGNOSIS — Z006 Encounter for examination for normal comparison and control in clinical research program: Secondary | ICD-10-CM | POA: Insufficient documentation

## 2017-03-06 DIAGNOSIS — Z79899 Other long term (current) drug therapy: Secondary | ICD-10-CM | POA: Diagnosis not present

## 2017-03-06 DIAGNOSIS — Z7189 Other specified counseling: Secondary | ICD-10-CM

## 2017-03-06 DIAGNOSIS — I10 Essential (primary) hypertension: Secondary | ICD-10-CM | POA: Diagnosis not present

## 2017-03-06 DIAGNOSIS — I700 Atherosclerosis of aorta: Secondary | ICD-10-CM | POA: Diagnosis not present

## 2017-03-06 NOTE — Patient Instructions (Addendum)

## 2017-03-06 NOTE — Progress Notes (Signed)
New eval for breast cancer. Complains of mild gas pains this morning.

## 2017-03-06 NOTE — Progress Notes (Signed)
Patient ID: Chelsea Hudson, female   DOB: [birth year], 51 y.o.   MRN: [id number]  Chief Complaint  Patient presents with  . Breast Problem    HPI Chelsea Hudson is a 51 y.o. female.  who presents for a breast evaluation. The most recent mammogram and biopsy was done on 02-28-17 . She states she first noticed a knot in the right breast about the middle of March. The patient reports that she is had lumpy breasts for many years, and only recently should she become aware of a notable change in the right breast. She states Chelsea Pimple RN with [facility] called her with the results on Friday showing cancer. She saw Dr Chelsea Hudson this morning. She states she will need chemotherapy that it is a stage 2 cancer. She states the area itches. Patient does not perform regular self breast checks and does not get regular mammograms done.   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 She is her with her aunt, Chelsea Hudson and sister Chelsea Hudson.  HPI  Past Medical History:  Diagnosis Date  . Cancer ([place]) 02/28/2017   INVASIVE MAMMARY CARCINOMA WITH MUCINOUS FEATURES.  . Hypertension   . Obesity     Past Surgical History:  Procedure Laterality Date  . ANKLE SURGERY    . BREAST BIOPSY Right 02/28/2017   INVASIVE MAMMARY CARCINOMA WITH MUCINOUS FEATURES.  Marland Kitchen BREAST CYST ASPIRATION Right    NEG    Family History  Problem Relation Age of Onset  . Diabetes Father   . Stroke Father   . Breast cancer Mother 31  . Brain cancer Maternal Aunt 60  . Colon cancer Neg Hx     Social History Social History  Substance Use Topics  . Smoking status: Current Every Day Smoker    Packs/day: 0.50    Years: 25.00    Types: Cigarettes  . Smokeless tobacco: Never Used  . Alcohol use Yes     Comment: occas    No Known Allergies  Current Outpatient Prescriptions  Medication Sig Dispense Refill  . amLODipine (NORVASC) 5 MG tablet Take 1 tablet (5 mg total) by mouth daily. 90 tablet 3  .  triamterene-hydrochlorothiazide (MAXZIDE-25) 37.5-25 MG tablet Take 1 tablet by mouth daily. 90 tablet 3   No current facility-administered medications for this visit.     Review of Systems Review of Systems  Constitutional: Negative.   Respiratory: Negative.   Cardiovascular: Negative.     Blood pressure (!) 144/86, pulse 86, resp. rate 14, height 5' 6"  (1.676 m), weight 250 lb (113.4 kg), last menstrual period 01/31/2014.  Physical Exam Physical Exam  Constitutional: She is oriented to person, place, and time. She appears well-developed and well-nourished.  HENT:  Mouth/Throat: Oropharynx is clear and moist.  Eyes: Conjunctivae are normal. No scleral icterus.  Neck: Neck supple.  Cardiovascular: Normal rate and regular rhythm.   Murmur heard.  Systolic murmur is present with a grade of 2/6  2+ bilateral lower leg edema  Pulmonary/Chest: Effort normal and breath sounds normal. Right breast exhibits no inverted nipple, no mass, no nipple discharge, no skin change and no tenderness. Left breast exhibits no inverted nipple, no mass, no nipple discharge, no skin change and no tenderness.    bilateral nipple crease, chronic per patient. Mass 10 to 12 o'clock right breast with tethering of the skin.  Lymphadenopathy:    She has no cervical adenopathy.    She has no axillary adenopathy.  Neurological: She is  alert and oriented to person, place, and time.  Skin: Skin is warm and dry.  Psychiatric: Her behavior is normal.    Data Reviewed Pathology from 02/28/2017 reviewed. ER positive, PR positive. Phone report from the [facility] HER-2 positive.  Axillary nodes positive.  Mammography and ultrasound March 23 through April 11 reviewed. 19 day delay between abnormal imaging studies and biopsy.  Assessment    Advanced right breast cancer with potential benefit for neoadjuvant chemotherapy.  Indeterminate candidate for breast conservation based on extent of pretreatment  disease and skin tethering superiorly.    Plan    Indications for neoadjuvant chemotherapy were reviewed. No survival benefit his yet known, but it may, possibly, make the patient candidate for breast conservation.  Approximate 45 minutes was spent reviewing treatment options and indications for the proposed procedures.     The risks associated with central venous access including arterial, pulmonary and venous injury were reviewed. The possible need for additional treatment if pulmonary injury occurs (chest tube placement) was discussed.  The patient is scheduled for surgery at [facility] on 03/15/17. She will pre admit by phone. The patient is aware of date and instructions.  Documented by Lesly Rubenstein LPN I have completed the exam and reviewed the above documentation for accuracy and completeness.  I agree with the above.  Haematologist has been used and any errors in dictation or transcription are unintentional.  Hervey Ard, M.D., F.A.C.S.   Robert Bellow 03/07/2017, 6:38 AM

## 2017-03-09 ENCOUNTER — Encounter
Admission: RE | Admit: 2017-03-09 | Discharge: 2017-03-09 | Disposition: A | Discharge status: Home / Self Care | Payer: Medicaid Other | Source: Ambulatory Visit | Attending: General Surgery | Admitting: General Surgery

## 2017-03-09 ENCOUNTER — Ambulatory Visit
Admission: RE | Admit: 2017-03-09 | Discharge: 2017-03-09 | Disposition: A | Discharge status: Home / Self Care | Payer: Medicaid Other | Source: Ambulatory Visit | Attending: Oncology | Admitting: Oncology

## 2017-03-09 DIAGNOSIS — C50411 Malignant neoplasm of upper-outer quadrant of right female breast: Secondary | ICD-10-CM | POA: Diagnosis present

## 2017-03-09 DIAGNOSIS — I700 Atherosclerosis of aorta: Secondary | ICD-10-CM | POA: Insufficient documentation

## 2017-03-09 DIAGNOSIS — K529 Noninfective gastroenteritis and colitis, unspecified: Secondary | ICD-10-CM | POA: Insufficient documentation

## 2017-03-09 DIAGNOSIS — K5730 Diverticulosis of large intestine without perforation or abscess without bleeding: Secondary | ICD-10-CM | POA: Insufficient documentation

## 2017-03-09 DIAGNOSIS — J320 Chronic maxillary sinusitis: Secondary | ICD-10-CM | POA: Diagnosis not present

## 2017-03-09 DIAGNOSIS — Z7189 Other specified counseling: Secondary | ICD-10-CM

## 2017-03-09 DIAGNOSIS — R590 Localized enlarged lymph nodes: Secondary | ICD-10-CM | POA: Diagnosis present

## 2017-03-09 HISTORY — DX: Unspecified osteoarthritis, unspecified site: M19.90

## 2017-03-09 HISTORY — DX: Cardiac arrhythmia, unspecified: I49.9

## 2017-03-09 LAB — GLUCOSE, CAPILLARY: Glucose-Capillary: 118 mg/dL — ABNORMAL HIGH (ref 65–99)

## 2017-03-09 MED ORDER — FLUDEOXYGLUCOSE F - 18 (FDG) INJECTION
12.5000 | Freq: Once | INTRAVENOUS | Status: AC | PRN
  Administered 2017-03-09: 12.4 via INTRAVENOUS

## 2017-03-09 NOTE — Patient Instructions (Signed)
  Your procedure is scheduled on: 03-15-17 (Thursday) Report to Same Day Surgery 2nd floor medical mall [facility] Entrance-take elevator on left to 2nd floor.Check in with surgery information desk.) To find out your arrival time please call [phone] between 1PM - 3PM on 03-14-17 (Wednesday)  Remember: Instructions that are not followed completely may result in serious medical risk, up to and including death, or upon the discretion of your surgeon and anesthesiologist your surgery may need to be rescheduled.    _x___ 1. Do not eat food or drink liquids after midnight. No gum chewing or hard candies.     __x__ 2. No Alcohol for 24 hours before or after surgery.   __x__3. No Smoking for 24 prior to surgery.   ____  4. Bring all medications with you on the day of surgery if instructed.    __x__ 5. Notify your doctor if there is any change in your medical condition     (cold, fever, infections).     Do not wear jewelry, make-up, hairpins, clips or nail polish.  Do not wear lotions, powders, or perfumes. You may wear deodorant.  Do not shave 48 hours prior to surgery. Men may shave face and neck.  Do not bring valuables to the hospital.    [facility] is not responsible for any belongings or valuables.               Contacts, dentures or bridgework may not be worn into surgery.  Leave your suitcase in the car. After surgery it may be brought to your room.  For patients admitted to the hospital, discharge time is determined by your treatment team.   Patients discharged the day of surgery will not be allowed to drive home.  You will need someone to drive you home and stay with you the night of your procedure.    Please read over the following fact sheets that you were given:     _x___ Take anti-hypertensive (unless it includes a diuretic), cardiac, seizure, asthma,     anti-reflux and psychiatric medicines. These include:  1. AMLODIPINE  (NORVASC)  2.  3.  4.  5.  6.  ____Fleets enema or Magnesium Citrate as directed.   _x___ Use CHG Soap or sage wipes as directed on instruction sheet   ____ Use inhalers on the day of surgery and bring to hospital day of surgery  ____ Stop Metformin and Janumet 2 days prior to surgery.    ____ Take 1/2 of usual insulin dose the night before surgery and none on the morning surgery.   ____ Follow recommendations from Cardiologist, Pulmonologist or PCP regarding stopping Aspirin, Coumadin, Pllavix ,Eliquis, Effient, or Pradaxa, and Pletal.  X____Stop Anti-inflammatories such as Advil, Aleve, Ibuprofen, Motrin, Naproxen, Naprosyn, Goodies powders or aspirin products NOW-OK to take Tylenol    ____ Stop supplements until after surgery.     ____ Bring C-Pap to the hospital.

## 2017-03-12 ENCOUNTER — Ambulatory Visit
Admission: RE | Admit: 2017-03-12 | Discharge: 2017-03-12 | Disposition: A | Discharge status: Home / Self Care | Payer: Medicaid Other | Source: Ambulatory Visit | Attending: Oncology | Admitting: Oncology

## 2017-03-12 ENCOUNTER — Encounter
Admission: RE | Admit: 2017-03-12 | Discharge: 2017-03-12 | Disposition: A | Discharge status: Home / Self Care | Payer: Medicaid Other | Source: Ambulatory Visit | Attending: General Surgery | Admitting: General Surgery

## 2017-03-12 DIAGNOSIS — F17210 Nicotine dependence, cigarettes, uncomplicated: Secondary | ICD-10-CM | POA: Diagnosis not present

## 2017-03-12 DIAGNOSIS — Z833 Family history of diabetes mellitus: Secondary | ICD-10-CM | POA: Insufficient documentation

## 2017-03-12 DIAGNOSIS — Z01810 Encounter for preprocedural cardiovascular examination: Secondary | ICD-10-CM | POA: Diagnosis present

## 2017-03-12 DIAGNOSIS — Z6841 Body Mass Index (BMI) 40.0 and over, adult: Secondary | ICD-10-CM | POA: Insufficient documentation

## 2017-03-12 DIAGNOSIS — C50411 Malignant neoplasm of upper-outer quadrant of right female breast: Secondary | ICD-10-CM | POA: Insufficient documentation

## 2017-03-12 DIAGNOSIS — Z170 Estrogen receptor positive status [ER+]: Secondary | ICD-10-CM | POA: Insufficient documentation

## 2017-03-12 DIAGNOSIS — Z79899 Other long term (current) drug therapy: Secondary | ICD-10-CM | POA: Diagnosis not present

## 2017-03-12 DIAGNOSIS — Z808 Family history of malignant neoplasm of other organs or systems: Secondary | ICD-10-CM | POA: Diagnosis not present

## 2017-03-12 DIAGNOSIS — Z823 Family history of stroke: Secondary | ICD-10-CM | POA: Diagnosis not present

## 2017-03-12 DIAGNOSIS — Z7189 Other specified counseling: Secondary | ICD-10-CM | POA: Diagnosis present

## 2017-03-12 DIAGNOSIS — Z01812 Encounter for preprocedural laboratory examination: Secondary | ICD-10-CM | POA: Insufficient documentation

## 2017-03-12 DIAGNOSIS — Z803 Family history of malignant neoplasm of breast: Secondary | ICD-10-CM | POA: Diagnosis not present

## 2017-03-12 DIAGNOSIS — E669 Obesity, unspecified: Secondary | ICD-10-CM | POA: Insufficient documentation

## 2017-03-12 DIAGNOSIS — I10 Essential (primary) hypertension: Secondary | ICD-10-CM | POA: Insufficient documentation

## 2017-03-12 LAB — BASIC METABOLIC PANEL
Anion gap: 6 (ref 5–15)
BUN: 18 mg/dL (ref 6–20)
CO2: 29 mmol/L (ref 22–32)
CREATININE: 0.85 mg/dL (ref 0.44–1.00)
Calcium: 9.4 mg/dL (ref 8.9–10.3)
Chloride: 106 mmol/L (ref 101–111)
GFR calc non Af Amer: >60 mL/min (ref >60)
Glucose, Bld: 77 mg/dL (ref 65–99)
Potassium: 3.8 mmol/L (ref 3.5–5.1)
Sodium: 141 mmol/L (ref 135–145)

## 2017-03-12 LAB — SURGICAL PATHOLOGY

## 2017-03-12 MED ORDER — TECHNETIUM TC 99M-LABELED RED BLOOD CELLS IV KIT
21.3800 | PACK | Freq: Once | INTRAVENOUS | Status: AC | PRN
  Administered 2017-03-12: 21.38 via INTRAVENOUS

## 2017-03-12 NOTE — Patient Instructions (Signed)
Doxorubicin injection What is this medicine? DOXORUBICIN (dox oh ROO bi sin) is a chemotherapy drug. It is used to treat many kinds of cancer like leukemia, lymphoma, neuroblastoma, sarcoma, and Wilms' tumor. It is also used to treat bladder cancer, breast cancer, lung cancer, ovarian cancer, stomach cancer, and thyroid cancer. This medicine may be used for other purposes; ask your health care provider or pharmacist if you have questions. COMMON BRAND NAME(S): Adriamycin, Adriamycin PFS, Adriamycin RDF, Rubex What should I tell my health care provider before I take this medicine? They need to know if you have any of these conditions: -heart disease -history of low blood counts caused by a medicine -liver disease -recent or ongoing radiation therapy -an unusual or allergic reaction to doxorubicin, other chemotherapy agents, other medicines, foods, dyes, or preservatives -pregnant or trying to get pregnant -breast-feeding How should I use this medicine? This drug is given as an infusion into a vein. It is administered in a hospital or clinic by a specially trained health care professional. If you have pain, swelling, burning or any unusual feeling around the site of your injection, tell your health care professional right away. Talk to your pediatrician regarding the use of this medicine in children. Special care may be needed. Overdosage: If you think you have taken too much of this medicine contact a poison control center or emergency room at once. NOTE: This medicine is only for you. Do not share this medicine with others. What if I miss a dose? It is important not to miss your dose. Call your doctor or health care professional if you are unable to keep an appointment. What may interact with this medicine? This medicine may interact with the following medications: -6-mercaptopurine -paclitaxel -phenytoin -St. John's Wort -trastuzumab -verapamil This list may not describe all possible  interactions. Give your health care provider a list of all the medicines, herbs, non-prescription drugs, or dietary supplements you use. Also tell them if you smoke, drink alcohol, or use illegal drugs. Some items may interact with your medicine. What should I watch for while using this medicine? This drug may make you feel generally unwell. This is not uncommon, as chemotherapy can affect healthy cells as well as cancer cells. Report any side effects. Continue your course of treatment even though you feel ill unless your doctor tells you to stop. There is a maximum amount of this medicine you should receive throughout your life. The amount depends on the medical condition being treated and your overall health. Your doctor will watch how much of this medicine you receive in your lifetime. Tell your doctor if you have taken this medicine before. You may need blood work done while you are taking this medicine. Your urine may turn red for a few days after your dose. This is not blood. If your urine is dark or brown, call your doctor. In some cases, you may be given additional medicines to help with side effects. Follow all directions for their use. Call your doctor or health care professional for advice if you get a fever, chills or sore throat, or other symptoms of a cold or flu. Do not treat yourself. This drug decreases your body's ability to fight infections. Try to avoid being around people who are sick. This medicine may increase your risk to bruise or bleed. Call your doctor or health care professional if you notice any unusual bleeding. Talk to your doctor about your risk of cancer. You may be more at risk for certain   types of cancers if you take this medicine. Do not become pregnant while taking this medicine or for 6 months after stopping it. Women should inform their doctor if they wish to become pregnant or think they might be pregnant. Men should not father a child while taking this medicine and  for 6 months after stopping it. There is a potential for serious side effects to an unborn child. Talk to your health care professional or pharmacist for more information. Do not breast-feed an infant while taking this medicine. This medicine has caused ovarian failure in some women and reduced sperm counts in some men This medicine may interfere with the ability to have a child. Talk with your doctor or health care professional if you are concerned about your fertility. What side effects may I notice from receiving this medicine? Side effects that you should report to your doctor or health care professional as soon as possible: -allergic reactions like skin rash, itching or hives, swelling of the face, lips, or tongue -breathing problems -chest pain -fast or irregular heartbeat -low blood counts - this medicine may decrease the number of white blood cells, red blood cells and platelets. You may be at increased risk for infections and bleeding. -pain, redness, or irritation at site where injected -signs of infection - fever or chills, cough, sore throat, pain or difficulty passing urine -signs of decreased platelets or bleeding - bruising, pinpoint red spots on the skin, black, tarry stools, blood in the urine -swelling of the ankles, feet, hands -tiredness -weakness Side effects that usually do not require medical attention (report to your doctor or health care professional if they continue or are bothersome): -diarrhea -hair loss -mouth sores -nail discoloration or damage -nausea -red colored urine -vomiting This list may not describe all possible side effects. Call your doctor for medical advice about side effects. You may report side effects to FDA at 1-800-FDA-1088. Where should I keep my medicine? This drug is given in a hospital or clinic and will not be stored at home. NOTE: This sheet is a summary. It may not cover all possible information. If you have questions about this  medicine, talk to your doctor, pharmacist, or health care provider.  2018 Elsevier/Gold Standard (2016-01-03 11:28:51) Cyclophosphamide injection What is this medicine? CYCLOPHOSPHAMIDE (sye kloe FOSS fa mide) is a chemotherapy drug. It slows the growth of cancer cells. This medicine is used to treat many types of cancer like lymphoma, myeloma, leukemia, breast cancer, and ovarian cancer, to name a few. This medicine may be used for other purposes; ask your health care provider or pharmacist if you have questions. COMMON BRAND NAME(S): Cytoxan, Neosar What should I tell my health care provider before I take this medicine? They need to know if you have any of these conditions: -blood disorders -history of other chemotherapy -infection -kidney disease -liver disease -recent or ongoing radiation therapy -tumors in the bone marrow -an unusual or allergic reaction to cyclophosphamide, other chemotherapy, other medicines, foods, dyes, or preservatives -pregnant or trying to get pregnant -breast-feeding How should I use this medicine? This drug is usually given as an injection into a vein or muscle or by infusion into a vein. It is administered in a hospital or clinic by a specially trained health care professional. Talk to your pediatrician regarding the use of this medicine in children. Special care may be needed. Overdosage: If you think you have taken too much of this medicine contact a poison control center or emergency room at   once. NOTE: This medicine is only for you. Do not share this medicine with others. What if I miss a dose? It is important not to miss your dose. Call your doctor or health care professional if you are unable to keep an appointment. What may interact with this medicine? This medicine may interact with the following medications: -amiodarone -amphotericin B -azathioprine -certain antiviral medicines for HIV or AIDS such as protease inhibitors (e.g., indinavir,  ritonavir) and zidovudine -certain blood pressure medications such as benazepril, captopril, enalapril, fosinopril, lisinopril, moexipril, monopril, perindopril, quinapril, ramipril, trandolapril -certain cancer medications such as anthracyclines (e.g., daunorubicin, doxorubicin), busulfan, cytarabine, paclitaxel, pentostatin, tamoxifen, trastuzumab -certain diuretics such as chlorothiazide, chlorthalidone, hydrochlorothiazide, indapamide, metolazone -certain medicines that treat or prevent blood clots like warfarin -certain muscle relaxants such as succinylcholine -cyclosporine -etanercept -indomethacin -medicines to increase blood counts like filgrastim, pegfilgrastim, sargramostim -medicines used as general anesthesia -metronidazole -natalizumab This list may not describe all possible interactions. Give your health care provider a list of all the medicines, herbs, non-prescription drugs, or dietary supplements you use. Also tell them if you smoke, drink alcohol, or use illegal drugs. Some items may interact with your medicine. What should I watch for while using this medicine? Visit your doctor for checks on your progress. This drug may make you feel generally unwell. This is not uncommon, as chemotherapy can affect healthy cells as well as cancer cells. Report any side effects. Continue your course of treatment even though you feel ill unless your doctor tells you to stop. Drink water or other fluids as directed. Urinate often, even at night. In some cases, you may be given additional medicines to help with side effects. Follow all directions for their use. Call your doctor or health care professional for advice if you get a fever, chills or sore throat, or other symptoms of a cold or flu. Do not treat yourself. This drug decreases your body's ability to fight infections. Try to avoid being around people who are sick. This medicine may increase your risk to bruise or bleed. Call your doctor or  health care professional if you notice any unusual bleeding. Be careful brushing and flossing your teeth or using a toothpick because you may get an infection or bleed more easily. If you have any dental work done, tell your dentist you are receiving this medicine. You may get drowsy or dizzy. Do not drive, use machinery, or do anything that needs mental alertness until you know how this medicine affects you. Do not become pregnant while taking this medicine or for 1 year after stopping it. Women should inform their doctor if they wish to become pregnant or think they might be pregnant. Men should not father a child while taking this medicine and for 4 months after stopping it. There is a potential for serious side effects to an unborn child. Talk to your health care professional or pharmacist for more information. Do not breast-feed an infant while taking this medicine. This medicine may interfere with the ability to have a child. This medicine has caused ovarian failure in some women. This medicine has caused reduced sperm counts in some men. You should talk with your doctor or health care professional if you are concerned about your fertility. If you are going to have surgery, tell your doctor or health care professional that you have taken this medicine. What side effects may I notice from receiving this medicine? Side effects that you should report to your doctor or health care professional as   soon as possible: -allergic reactions like skin rash, itching or hives, swelling of the face, lips, or tongue -low blood counts - this medicine may decrease the number of white blood cells, red blood cells and platelets. You may be at increased risk for infections and bleeding. -signs of infection - fever or chills, cough, sore throat, pain or difficulty passing urine -signs of decreased platelets or bleeding - bruising, pinpoint red spots on the skin, black, tarry stools, blood in the urine -signs of  decreased red blood cells - unusually weak or tired, fainting spells, lightheadedness -breathing problems -dark urine -dizziness -palpitations -swelling of the ankles, feet, hands -trouble passing urine or change in the amount of urine -weight gain -yellowing of the eyes or skin Side effects that usually do not require medical attention (report to your doctor or health care professional if they continue or are bothersome): -changes in nail or skin color -hair loss -missed menstrual periods -mouth sores -nausea, vomiting This list may not describe all possible side effects. Call your doctor for medical advice about side effects. You may report side effects to FDA at 1-800-FDA-1088. Where should I keep my medicine? This drug is given in a hospital or clinic and will not be stored at home. NOTE: This sheet is a summary. It may not cover all possible information. If you have questions about this medicine, talk to your doctor, pharmacist, or health care provider.  2018 Elsevier/Gold Standard (2012-09-20 16:22:58) Paclitaxel injection What is this medicine? PACLITAXEL (PAK li TAX el) is a chemotherapy drug. It targets fast dividing cells, like cancer cells, and causes these cells to die. This medicine is used to treat ovarian cancer, breast cancer, and other cancers. This medicine may be used for other purposes; ask your health care provider or pharmacist if you have questions. COMMON BRAND NAME(S): Onxol, Taxol What should I tell my health care provider before I take this medicine? They need to know if you have any of these conditions: -blood disorders -irregular heartbeat -infection (especially a virus infection such as chickenpox, cold sores, or herpes) -liver disease -previous or ongoing radiation therapy -an unusual or allergic reaction to paclitaxel, alcohol, polyoxyethylated castor oil, other chemotherapy agents, other medicines, foods, dyes, or preservatives -pregnant or trying to  get pregnant -breast-feeding How should I use this medicine? This drug is given as an infusion into a vein. It is administered in a hospital or clinic by a specially trained health care professional. Talk to your pediatrician regarding the use of this medicine in children. Special care may be needed. Overdosage: If you think you have taken too much of this medicine contact a poison control center or emergency room at once. NOTE: This medicine is only for you. Do not share this medicine with others. What if I miss a dose? It is important not to miss your dose. Call your doctor or health care professional if you are unable to keep an appointment. What may interact with this medicine? Do not take this medicine with any of the following medications: -disulfiram -metronidazole This medicine may also interact with the following medications: -cyclosporine -diazepam -ketoconazole -medicines to increase blood counts like filgrastim, pegfilgrastim, sargramostim -other chemotherapy drugs like cisplatin, doxorubicin, epirubicin, etoposide, teniposide, vincristine -quinidine -testosterone -vaccines -verapamil Talk to your doctor or health care professional before taking any of these medicines: -acetaminophen -aspirin -ibuprofen -ketoprofen -naproxen This list may not describe all possible interactions. Give your health care provider a list of all the medicines, herbs, non-prescription drugs, or   dietary supplements you use. Also tell them if you smoke, drink alcohol, or use illegal drugs. Some items may interact with your medicine. What should I watch for while using this medicine? Your condition will be monitored carefully while you are receiving this medicine. You will need important blood work done while you are taking this medicine. This medicine can cause serious allergic reactions. To reduce your risk you will need to take other medicine(s) before treatment with this medicine. If you  experience allergic reactions like skin rash, itching or hives, swelling of the face, lips, or tongue, tell your doctor or health care professional right away. In some cases, you may be given additional medicines to help with side effects. Follow all directions for their use. This drug may make you feel generally unwell. This is not uncommon, as chemotherapy can affect healthy cells as well as cancer cells. Report any side effects. Continue your course of treatment even though you feel ill unless your doctor tells you to stop. Call your doctor or health care professional for advice if you get a fever, chills or sore throat, or other symptoms of a cold or flu. Do not treat yourself. This drug decreases your body's ability to fight infections. Try to avoid being around people who are sick. This medicine may increase your risk to bruise or bleed. Call your doctor or health care professional if you notice any unusual bleeding. Be careful brushing and flossing your teeth or using a toothpick because you may get an infection or bleed more easily. If you have any dental work done, tell your dentist you are receiving this medicine. Avoid taking products that contain aspirin, acetaminophen, ibuprofen, naproxen, or ketoprofen unless instructed by your doctor. These medicines may hide a fever. Do not become pregnant while taking this medicine. Women should inform their doctor if they wish to become pregnant or think they might be pregnant. There is a potential for serious side effects to an unborn child. Talk to your health care professional or pharmacist for more information. Do not breast-feed an infant while taking this medicine. Men are advised not to father a child while receiving this medicine. This product may contain alcohol. Ask your pharmacist or healthcare provider if this medicine contains alcohol. Be sure to tell all healthcare providers you are taking this medicine. Certain medicines, like metronidazole  and disulfiram, can cause an unpleasant reaction when taken with alcohol. The reaction includes flushing, headache, nausea, vomiting, sweating, and increased thirst. The reaction can last from 30 minutes to several hours. What side effects may I notice from receiving this medicine? Side effects that you should report to your doctor or health care professional as soon as possible: -allergic reactions like skin rash, itching or hives, swelling of the face, lips, or tongue -low blood counts - This drug may decrease the number of white blood cells, red blood cells and platelets. You may be at increased risk for infections and bleeding. -signs of infection - fever or chills, cough, sore throat, pain or difficulty passing urine -signs of decreased platelets or bleeding - bruising, pinpoint red spots on the skin, black, tarry stools, nosebleeds -signs of decreased red blood cells - unusually weak or tired, fainting spells, lightheadedness -breathing problems -chest pain -high or low blood pressure -mouth sores -nausea and vomiting -pain, swelling, redness or irritation at the injection site -pain, tingling, numbness in the hands or feet -slow or irregular heartbeat -swelling of the ankle, feet, hands Side effects that usually do not   require medical attention (report to your doctor or health care professional if they continue or are bothersome): -bone pain -complete hair loss including hair on your head, underarms, pubic hair, eyebrows, and eyelashes -changes in the color of fingernails -diarrhea -loosening of the fingernails -loss of appetite -muscle or joint pain -red flush to skin -sweating This list may not describe all possible side effects. Call your doctor for medical advice about side effects. You may report side effects to FDA at 1-800-FDA-1088. Where should I keep my medicine? This drug is given in a hospital or clinic and will not be stored at home. NOTE: This sheet is a summary. It  may not cover all possible information. If you have questions about this medicine, talk to your doctor, pharmacist, or health care provider.  2018 Elsevier/Gold Standard (2015-09-07 19:58:00)  

## 2017-03-13 ENCOUNTER — Inpatient Hospital Stay: Payer: Medicaid Other

## 2017-03-13 ENCOUNTER — Encounter: Payer: Self-pay | Admitting: *Deleted

## 2017-03-13 ENCOUNTER — Encounter: Payer: Self-pay | Admitting: Oncology

## 2017-03-14 ENCOUNTER — Other Ambulatory Visit: Payer: Self-pay | Admitting: Oncology

## 2017-03-14 ENCOUNTER — Encounter: Payer: Self-pay | Admitting: Oncology

## 2017-03-14 ENCOUNTER — Other Ambulatory Visit: Payer: Self-pay | Admitting: *Deleted

## 2017-03-14 DIAGNOSIS — Z006 Encounter for examination for normal comparison and control in clinical research program: Secondary | ICD-10-CM

## 2017-03-14 DIAGNOSIS — C50411 Malignant neoplasm of upper-outer quadrant of right female breast: Secondary | ICD-10-CM

## 2017-03-14 DIAGNOSIS — Z170 Estrogen receptor positive status [ER+]: Principal | ICD-10-CM

## 2017-03-14 MED ORDER — FAMOTIDINE 20 MG PO TABS
20.0000 mg | ORAL_TABLET | Freq: Once | ORAL | Status: AC
  Administered 2017-03-15: 20 mg via ORAL

## 2017-03-14 MED ORDER — CEFAZOLIN SODIUM-DEXTROSE 2-4 GM/100ML-% IV SOLN
2.0000 g | INTRAVENOUS | Status: AC
  Administered 2017-03-15: 2 g via INTRAVENOUS

## 2017-03-15 ENCOUNTER — Encounter
Admission: RE | Disposition: A | Discharge status: Home / Self Care | Payer: Self-pay | Source: Ambulatory Visit | Attending: General Surgery

## 2017-03-15 ENCOUNTER — Ambulatory Visit: Payer: Medicaid Other

## 2017-03-15 ENCOUNTER — Ambulatory Visit: Payer: Medicaid Other | Admitting: Anesthesiology

## 2017-03-15 ENCOUNTER — Ambulatory Visit
Admission: RE | Admit: 2017-03-15 | Discharge: 2017-03-15 | Disposition: A | Discharge status: Home / Self Care | Payer: Medicaid Other | Source: Ambulatory Visit | Attending: General Surgery | Admitting: General Surgery

## 2017-03-15 ENCOUNTER — Encounter: Payer: Self-pay | Admitting: *Deleted

## 2017-03-15 DIAGNOSIS — C50411 Malignant neoplasm of upper-outer quadrant of right female breast: Secondary | ICD-10-CM | POA: Diagnosis not present

## 2017-03-15 DIAGNOSIS — Z6841 Body Mass Index (BMI) 40.0 and over, adult: Secondary | ICD-10-CM | POA: Diagnosis not present

## 2017-03-15 DIAGNOSIS — F17210 Nicotine dependence, cigarettes, uncomplicated: Secondary | ICD-10-CM | POA: Insufficient documentation

## 2017-03-15 DIAGNOSIS — Z170 Estrogen receptor positive status [ER+]: Secondary | ICD-10-CM

## 2017-03-15 DIAGNOSIS — Z79899 Other long term (current) drug therapy: Secondary | ICD-10-CM | POA: Diagnosis not present

## 2017-03-15 DIAGNOSIS — E669 Obesity, unspecified: Secondary | ICD-10-CM | POA: Insufficient documentation

## 2017-03-15 DIAGNOSIS — I10 Essential (primary) hypertension: Secondary | ICD-10-CM | POA: Insufficient documentation

## 2017-03-15 DIAGNOSIS — Z95828 Presence of other vascular implants and grafts: Secondary | ICD-10-CM

## 2017-03-15 HISTORY — PX: PORTACATH PLACEMENT: SHX2246

## 2017-03-15 LAB — POCT PREGNANCY, URINE: PREG TEST UR: NEGATIVE

## 2017-03-15 SURGERY — INSERTION, TUNNELED CENTRAL VENOUS DEVICE, WITH PORT
Anesthesia: General | Laterality: Left | Wound class: Clean

## 2017-03-15 MED ORDER — LACTATED RINGERS IV SOLN
INTRAVENOUS | Status: DC
  Administered 2017-03-15: 07:00:00 via INTRAVENOUS

## 2017-03-15 MED ORDER — SODIUM CHLORIDE 0.9 % IJ SOLN
INTRAMUSCULAR | Status: DC | PRN
  Administered 2017-03-15: 15 mL

## 2017-03-15 MED ORDER — FENTANYL CITRATE (PF) 100 MCG/2ML IJ SOLN: 25.0000 ug | INTRAMUSCULAR | Status: DC | PRN

## 2017-03-15 MED ORDER — PROPOFOL 500 MG/50ML IV EMUL
INTRAVENOUS | Status: AC
  Filled 2017-03-15: qty 50

## 2017-03-15 MED ORDER — CEFAZOLIN SODIUM-DEXTROSE 2-4 GM/100ML-% IV SOLN
INTRAVENOUS | Status: AC
  Filled 2017-03-15: qty 100

## 2017-03-15 MED ORDER — GLYCOPYRROLATE 0.2 MG/ML IJ SOLN
INTRAMUSCULAR | Status: AC
  Filled 2017-03-15: qty 1

## 2017-03-15 MED ORDER — MEPERIDINE HCL 50 MG/ML IJ SOLN: 6.2500 mg | INTRAMUSCULAR | Status: DC | PRN

## 2017-03-15 MED ORDER — LIDOCAINE HCL (PF) 1 % IJ SOLN
INTRAMUSCULAR | Status: DC | PRN
  Administered 2017-03-15: 10 mL

## 2017-03-15 MED ORDER — SODIUM CHLORIDE 0.9 % IJ SOLN
INTRAMUSCULAR | Status: AC
  Filled 2017-03-15: qty 50

## 2017-03-15 MED ORDER — FAMOTIDINE 20 MG PO TABS
ORAL_TABLET | ORAL | Status: AC
  Filled 2017-03-15: qty 1

## 2017-03-15 MED ORDER — FENTANYL CITRATE (PF) 100 MCG/2ML IJ SOLN
INTRAMUSCULAR | Status: DC | PRN
  Administered 2017-03-15: 25 ug via INTRAVENOUS
  Administered 2017-03-15: 50 ug via INTRAVENOUS
  Administered 2017-03-15: 25 ug via INTRAVENOUS

## 2017-03-15 MED ORDER — PROPOFOL 500 MG/50ML IV EMUL
INTRAVENOUS | Status: DC | PRN
  Administered 2017-03-15: 100 ug/kg/min via INTRAVENOUS

## 2017-03-15 MED ORDER — ACETAMINOPHEN 10 MG/ML IV SOLN
INTRAVENOUS | Status: AC
  Filled 2017-03-15: qty 100

## 2017-03-15 MED ORDER — MIDAZOLAM HCL 2 MG/2ML IJ SOLN
INTRAMUSCULAR | Status: DC | PRN
  Administered 2017-03-15: 2 mg via INTRAVENOUS

## 2017-03-15 MED ORDER — ACETAMINOPHEN 10 MG/ML IV SOLN
INTRAVENOUS | Status: DC | PRN
  Administered 2017-03-15: 1000 mg via INTRAVENOUS

## 2017-03-15 MED ORDER — MIDAZOLAM HCL 2 MG/2ML IJ SOLN
INTRAMUSCULAR | Status: AC
  Filled 2017-03-15: qty 2

## 2017-03-15 MED ORDER — EPHEDRINE SULFATE 50 MG/ML IJ SOLN
INTRAMUSCULAR | Status: AC
  Filled 2017-03-15: qty 1

## 2017-03-15 MED ORDER — PHENYLEPHRINE HCL 10 MG/ML IJ SOLN
INTRAMUSCULAR | Status: AC
  Filled 2017-03-15: qty 1

## 2017-03-15 MED ORDER — LIDOCAINE HCL (PF) 2 % IJ SOLN
INTRAMUSCULAR | Status: AC
  Filled 2017-03-15: qty 2

## 2017-03-15 MED ORDER — PROMETHAZINE HCL 25 MG/ML IJ SOLN: 6.2500 mg | INTRAMUSCULAR | Status: DC | PRN

## 2017-03-15 MED ORDER — HYDROCODONE-ACETAMINOPHEN 5-325 MG PO TABS: 1.0000 | ORAL_TABLET | ORAL | 0 refills | Status: DC | PRN

## 2017-03-15 MED ORDER — LIDOCAINE HCL (PF) 1 % IJ SOLN
INTRAMUSCULAR | Status: AC
  Filled 2017-03-15: qty 30

## 2017-03-15 MED ORDER — FENTANYL CITRATE (PF) 100 MCG/2ML IJ SOLN
INTRAMUSCULAR | Status: AC
  Filled 2017-03-15: qty 2

## 2017-03-15 SURGICAL SUPPLY — 31 items
BLADE SURG 15 STRL SS SAFETY (BLADE) ×3 IMPLANT
CHLORAPREP W/TINT 26ML (MISCELLANEOUS) ×3 IMPLANT
CLOSURE WOUND 1/2 X4 (GAUZE/BANDAGES/DRESSINGS) ×1
COVER LIGHT HANDLE STERIS (MISCELLANEOUS) ×6 IMPLANT
COVER MAYO STAND STRL (DRAPES) ×3 IMPLANT
COVER PROBE FLX POLY STRL (MISCELLANEOUS) ×6 IMPLANT
DECANTER SPIKE VIAL GLASS SM (MISCELLANEOUS) ×3 IMPLANT
DRAPE C-ARM XRAY 36X54 (DRAPES) IMPLANT
DRAPE LAPAROTOMY TRNSV 106X77 (MISCELLANEOUS) ×3 IMPLANT
DRSG TEGADERM 2-3/8X2-3/4 SM (GAUZE/BANDAGES/DRESSINGS) ×3 IMPLANT
DRSG TEGADERM 4X4.75 (GAUZE/BANDAGES/DRESSINGS) ×3 IMPLANT
DRSG TELFA 4X3 1S NADH ST (GAUZE/BANDAGES/DRESSINGS) ×3 IMPLANT
ELECT REM PT RETURN 9FT ADLT (ELECTROSURGICAL) ×3
ELECTRODE REM PT RTRN 9FT ADLT (ELECTROSURGICAL) ×1 IMPLANT
GLOVE BIO SURGEON STRL SZ7.5 (GLOVE) ×6 IMPLANT
GLOVE INDICATOR 8.0 STRL GRN (GLOVE) ×6 IMPLANT
GOWN STRL REUS W/ TWL LRG LVL3 (GOWN DISPOSABLE) ×2 IMPLANT
GOWN STRL REUS W/TWL LRG LVL3 (GOWN DISPOSABLE) ×4
KIT PORT POWER 8FR ISP CVUE (Catheter) ×3 IMPLANT
KIT RM TURNOVER STRD PROC AR (KITS) ×3 IMPLANT
LABEL OR SOLS (LABEL) ×3 IMPLANT
NEEDLE HYPO 25X1 1.5 SAFETY (NEEDLE) ×3 IMPLANT
NS IRRIG 500ML POUR BTL (IV SOLUTION) ×3 IMPLANT
PACK PACE INSERTION (MISCELLANEOUS) ×3 IMPLANT
STRIP CLOSURE SKIN 1/2X4 (GAUZE/BANDAGES/DRESSINGS) ×2 IMPLANT
SUT PROLENE 3 0 SH DA (SUTURE) ×3 IMPLANT
SUT VIC AB 3-0 SH 27 (SUTURE) ×2
SUT VIC AB 3-0 SH 27X BRD (SUTURE) ×1 IMPLANT
SUT VIC AB 4-0 FS2 27 (SUTURE) ×3 IMPLANT
SWABSTK COMLB BENZOIN TINCTURE (MISCELLANEOUS) ×3 IMPLANT
SYR 10ML SLIP (SYRINGE) ×3 IMPLANT

## 2017-03-15 NOTE — Op Note (Signed)
Preoperative diagnosis: Advanced breast cancer.  Postoperative diagnosis: Same.  Operative procedure: Left subclavian PowerPort placement with ultrasound and fluoroscopic guidance.  Operating surgeon: Ollen Bowl, M.D.  Anesthesia: Monitored anesthesia care, 10 mL 1% plain Xylocaine.  Estimate blood loss: Minimal.  Clinical note: This 51 year old woman has advanced cancer and is a candidate for neoadjuvant chemotherapy. Central venous access was requested by her treating oncologist.  Operative note: The patient was placed in Trendelenburg position and the neck and chest was prepped with ChloraPrep and draped. Ultrasound was used to identify the subclavian vein. Local anesthetic was infiltrated and the vein was cannulated. The guidewire was passed initially was found to be in the internal jugular vein. This was repositioned under fluoroscopic guidance. The dilator was placed followed by the catheter. The catheter tip was positioned at the junction of the SVC and right atrium. The catheter was tunneled to a pocket on the left anterior chest. The patient has a generous layer of adipose tissue in the port was positioned to allow to be pressed against the underlying costochondral cartilage. It was anchored in place with 3-0 Prolene sutures 2. The catheter easily irrigated and aspirated at the end of the procedure. The adipose layer was closed with a running 3-0 Vicryl suture. The skin was closed with a running 4-0 Vicryl subcuticular suture. Benzoin, Steri-Strips, Telfa and Tegaderm dressing was applied.  The patient was taken to the recovery room in stable condition.  A erect portal chest x-ray obtained post procedure was reviewed and showed the catheter tip as described above and no evidence of pneumothorax.

## 2017-03-15 NOTE — H&P (Signed)
No change in clinical condition or exam.  

## 2017-03-15 NOTE — Anesthesia Preprocedure Evaluation (Signed)
Anesthesia Evaluation  Patient identified by MRN, date of birth, ID band Patient awake    Reviewed: Allergy & Precautions, NPO status , Patient's Chart, lab work & pertinent test results  History of Anesthesia Complications Negative for: history of anesthetic complications  Airway Mallampati: III  TM Distance: >3 FB Neck ROM: Full    Dental no notable dental hx.    Pulmonary neg sleep apnea, neg COPD, Current Smoker,    breath sounds clear to auscultation- rhonchi (-) wheezing      Cardiovascular hypertension, Pt. on medications (-) CAD and (-) Past MI  Rhythm:Regular Rate:Normal - Systolic murmurs and - Diastolic murmurs    Neuro/Psych negative neurological ROS  negative psych ROS   GI/Hepatic negative GI ROS, Neg liver ROS,   Endo/Other  negative endocrine ROSneg diabetes  Renal/GU negative Renal ROS     Musculoskeletal  (+) Arthritis ,   Abdominal (+) + obese,   Peds  Hematology negative hematology ROS (+)   Anesthesia Other Findings Past Medical History: No date: Arthritis     Comment: SHOULDER 02/28/2017: Cancer ([place])     Comment: INVASIVE MAMMARY CARCINOMA WITH MUCINOUS               FEATURES. No date: Hypertension No date: Irregular heart beat     Comment: PT STATES IT "SKIPS A BEAT"  No date: Obesity   Reproductive/Obstetrics                             Anesthesia Physical Anesthesia Plan  ASA: III  Anesthesia Plan: General   Post-op Pain Management:    Induction: Intravenous  Airway Management Planned: Natural Airway  Additional Equipment:   Intra-op Plan:   Post-operative Plan:   Informed Consent: I have reviewed the patients History and Physical, chart, labs and discussed the procedure including the risks, benefits and alternatives for the proposed anesthesia with the patient or authorized representative who has indicated his/her understanding and  acceptance.   Dental advisory given  Plan Discussed with: CRNA and Anesthesiologist  Anesthesia Plan Comments:         Anesthesia Quick Evaluation

## 2017-03-15 NOTE — Anesthesia Post-op Follow-up Note (Cosign Needed)
Anesthesia QCDR form completed.        

## 2017-03-15 NOTE — Discharge Instructions (Addendum)
AMBULATORY SURGERY  °DISCHARGE INSTRUCTIONS ° ° °1) The drugs that you were given will stay in your system until tomorrow so for the next 24 hours you should not: ° °A) Drive an automobile °B) Make any legal decisions °C) Drink any alcoholic beverage ° ° °2) You may resume regular meals tomorrow.  Today it is better to start with liquids and gradually work up to solid foods. ° °You may eat anything you prefer, but it is better to start with liquids, then soup and crackers, and gradually work up to solid foods. ° ° °3) Please notify your doctor immediately if you have any unusual bleeding, trouble breathing, redness and pain at the surgery site, drainage, fever, or pain not relieved by medication. ° ° °4) Additional Instructions: ° °

## 2017-03-15 NOTE — Anesthesia Procedure Notes (Signed)
Date/Time: 03/15/2017 7:24 AM Performed by: Doreen Salvage Pre-anesthesia Checklist: Patient identified, Emergency Drugs available, Suction available and Patient being monitored Patient Re-evaluated:Patient Re-evaluated prior to inductionOxygen Delivery Method: Simple face mask Intubation Type: IV induction Dental Injury: Teeth and Oropharynx as per pre-operative assessment

## 2017-03-15 NOTE — Anesthesia Postprocedure Evaluation (Signed)
Anesthesia Post Note  Patient: Chelsea Hudson  Procedure(s) Performed: Procedure(s) (LRB): INSERTION PORT-A-CATH (Left)  Patient location during evaluation: PACU Anesthesia Type: General Level of consciousness: awake and alert and oriented Pain management: pain level controlled Vital Signs Assessment: post-procedure vital signs reviewed and stable Respiratory status: spontaneous breathing, nonlabored ventilation and respiratory function stable Cardiovascular status: blood pressure returned to baseline and stable Postop Assessment: no signs of nausea or vomiting Anesthetic complications: no     Last Vitals:  Vitals:   03/15/17 0853 03/15/17 0913  BP: (!) 142/87 127/81  Pulse: 76   Resp: 18   Temp: 36.6 C     Last Pain:  Vitals:   03/15/17 0853  TempSrc: Temporal                 Jayna Mulnix

## 2017-03-15 NOTE — Transfer of Care (Signed)
Immediate Anesthesia Transfer of Care Note  Patient: Chelsea Hudson  Procedure(s) Performed: Procedure(s): INSERTION PORT-A-CATH (Left)  Patient Location: PACU  Anesthesia Type:General  Level of Consciousness: sedated  Airway & Oxygen Therapy: Patient Spontanous Breathing and Patient connected to face mask oxygen  Post-op Assessment: Report given to RN and Post -op Vital signs reviewed and stable  Post vital signs: Reviewed and stable  Last Vitals:  Vitals:   03/15/17 0611 03/15/17 0811  BP: (!) 162/105 112/74  Pulse: 89 92  Resp: 16 20  Temp: 36.7 C 29.5 C    Complications: No apparent anesthesia complications

## 2017-03-16 ENCOUNTER — Inpatient Hospital Stay: Payer: Medicaid Other

## 2017-03-16 DIAGNOSIS — Z170 Estrogen receptor positive status [ER+]: Principal | ICD-10-CM

## 2017-03-16 DIAGNOSIS — C50411 Malignant neoplasm of upper-outer quadrant of right female breast: Secondary | ICD-10-CM

## 2017-03-18 ENCOUNTER — Other Ambulatory Visit: Payer: Self-pay | Admitting: Oncology

## 2017-03-18 DIAGNOSIS — Z170 Estrogen receptor positive status [ER+]: Principal | ICD-10-CM

## 2017-03-18 DIAGNOSIS — C50411 Malignant neoplasm of upper-outer quadrant of right female breast: Secondary | ICD-10-CM

## 2017-03-18 MED ORDER — PROCHLORPERAZINE MALEATE 10 MG PO TABS: 10.0000 mg | ORAL_TABLET | Freq: Four times a day (QID) | ORAL | 3 refills | Status: DC | PRN

## 2017-03-18 MED ORDER — ONDANSETRON HCL 8 MG PO TABS: 8.0000 mg | ORAL_TABLET | Freq: Two times a day (BID) | ORAL | 3 refills | Status: DC | PRN

## 2017-03-18 MED ORDER — LIDOCAINE-PRILOCAINE 2.5-2.5 % EX CREA: TOPICAL_CREAM | CUTANEOUS | 3 refills | Status: DC

## 2017-03-18 NOTE — Progress Notes (Signed)
START ON PATHWAY REGIMEN - Breast   Dose-Dense AC q14 days:   A cycle is every 14 days:     Doxorubicin      Cyclophosphamide      Pegfilgrastim   **Always confirm dose/schedule in your pharmacy ordering system**    Paclitaxel 80 mg/m2 Weekly:   Administer weekly:     Paclitaxel   **Always confirm dose/schedule in your pharmacy ordering system**    Patient Characteristics: Preoperative or Nonsurgical Candidate (Clinical Staging), Neoadjuvant Therapy followed by Surgery, Invasive Disease, Chemotherapy, HER2 Negative/Unknown/Equivocal, ER Positive Therapeutic Status: Preoperative or Nonsurgical Candidate (Clinical Staging) AJCC M Category: cM0 AJCC Grade: G2 Breast Surgical Plan: Neoadjuvant Therapy followed by Surgery ER Status: Positive (+) AJCC 8 Stage Grouping: IIA HER2 Status: Negative (-) AJCC T Category: cT2 AJCC N Category: cN2a PR Status: Positive (+)  Intent of Therapy: Curative Intent, Not Discussed with Patient

## 2017-03-19 ENCOUNTER — Other Ambulatory Visit: Payer: Self-pay | Admitting: Oncology

## 2017-03-19 DIAGNOSIS — C50411 Malignant neoplasm of upper-outer quadrant of right female breast: Secondary | ICD-10-CM

## 2017-03-19 DIAGNOSIS — Z170 Estrogen receptor positive status [ER+]: Principal | ICD-10-CM

## 2017-03-19 NOTE — Progress Notes (Signed)
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  INTERVAL HISTORY: Patient returns to clinic today for further evaluation and consideration of cycle 1 of 4 of Adriamycin and Cytoxan. She is anxious, but otherwise feels well. She has no neurologic complaints. She denies any recent fevers or illnesses. She has a good appetite and denies weight loss. She has no chest pain or shortness of breath. She denies any nausea, vomiting, constipation, or diarrhea. She has no urinary complaints. Patient offers no further specific complaints.  REVIEW OF SYSTEMS:   Review of Systems  Constitutional: Negative.  Negative for fever, malaise/fatigue and weight loss.  Respiratory: Negative.  Negative for cough.   Cardiovascular: Negative.  Negative for chest pain and leg swelling.  Gastrointestinal: Negative.  Negative for abdominal pain.  Genitourinary: Negative.   Musculoskeletal: Negative.   Skin: Negative.  Negative for rash.  Neurological: Negative.  Negative for weakness.  Psychiatric/Behavioral: The patient is nervous/anxious.     As per HPI. Otherwise, a complete review of systems is negative.  PAST MEDICAL HISTORY: Past Medical History:  Diagnosis Date  . Arthritis    SHOULDER  . Cancer ([place]) 02/28/2017   INVASIVE MAMMARY CARCINOMA WITH MUCINOUS FEATURES.  . Hypertension   . Irregular heart beat    PT STATES IT "SKIPS A BEAT"   . Obesity     PAST SURGICAL HISTORY: Past Surgical History:  Procedure Laterality Date  . ANKLE SURGERY    . BREAST BIOPSY Right  02/28/2017   INVASIVE MAMMARY CARCINOMA WITH MUCINOUS FEATURES.  Marland Kitchen BREAST CYST ASPIRATION Right    NEG  . PORTACATH PLACEMENT Left 03/15/2017   Procedure: INSERTION PORT-A-CATH;  Surgeon: Robert Bellow, MD;  Location: [facility] ORS;  Service: General;  Laterality: Left;    FAMILY HISTORY: Family History  Problem Relation Age of Onset  . Diabetes Father   . Stroke Father   . Breast cancer Mother 17  . Brain cancer Maternal Aunt 60  . Colon cancer Neg Hx     ADVANCED DIRECTIVES (Y/N):  N  HEALTH MAINTENANCE: Social History  Substance Use Topics  . Smoking status: Current Every Day Smoker    Packs/day: 0.50    Years: 25.00    Types: Cigarettes  . Smokeless tobacco: Never Used  . Alcohol use Yes     Comment: occas     Colonoscopy:  PAP:  Bone density:  Lipid panel:  Allergies  Allergen Reactions  . Other Hives and Itching    [name]     Current Outpatient Prescriptions  Medication Sig Dispense Refill  . acetaminophen (TYLENOL) 500 MG tablet Take 1,000 mg by mouth every 6 (six) hours as needed for mild pain.    Marland Kitchen amLODipine (NORVASC) 5 MG tablet Take 1 tablet (5 mg total) by mouth daily. (Patient taking differently: Take 5 mg by mouth every morning. ) 90 tablet 3  . lidocaine-prilocaine (EMLA) cream Apply to affected area once 30 g 3  . ondansetron (ZOFRAN) 8 MG tablet  Take 1 tablet (8 mg total) by mouth 2 (two) times daily as needed. 60 tablet 3  . prochlorperazine (COMPAZINE) 10 MG tablet Take 1 tablet (10 mg total) by mouth every 6 (six) hours as needed (Nausea or vomiting). 60 tablet 3  . triamterene-hydrochlorothiazide (MAXZIDE-25) 37.5-25 MG tablet Take 1 tablet by mouth daily. 90 tablet 3   No current facility-administered medications for this visit.     OBJECTIVE: Vitals:   03/20/17 1128  BP: 123/85  Pulse: 92  Temp: 97.9 F (36.6 C)     Body mass index  is 37.73 kg/m.    ECOG FS:0 - Asymptomatic  General: Well-developed, well-nourished, no acute distress. Eyes: Pink conjunctiva, anicteric sclera. Breasts: Patient requested exam be deferred today. Lungs: Clear to auscultation bilaterally. Heart: Regular rate and rhythm. No rubs, murmurs, or gallops. Abdomen: Soft, nontender, nondistended. No organomegaly noted, normoactive bowel sounds. Musculoskeletal: No edema, cyanosis, or clubbing. Neuro: Alert, answering all questions appropriately. Cranial nerves grossly intact. Skin: No rashes or petechiae noted. Psych: Normal affect.   LAB RESULTS:  Lab Results  Component Value Date   NA 137 03/20/2017   K 3.9 03/20/2017   CL 105 03/20/2017   CO2 26 03/20/2017   GLUCOSE 119 (H) 03/20/2017   BUN 24 (H) 03/20/2017   CREATININE 0.84 03/20/2017   CALCIUM 9.4 03/20/2017   PROT 8.0 03/20/2017   ALBUMIN 3.7 03/20/2017   AST 22 03/20/2017   ALT 25 03/20/2017   ALKPHOS 105 03/20/2017   BILITOT 0.5 03/20/2017   GFRNONAA >60 03/20/2017   GFRAA >60 03/20/2017    Lab Results  Component Value Date   WBC 8.2 03/20/2017   NEUTROABS 5.3 03/20/2017   HGB 12.7 03/20/2017   HCT 38.3 03/20/2017   MCV 74.3 (L) 03/20/2017   PLT 323 03/20/2017     STUDIES: Nm Cardiac Muga Rest  Result Date: 03/12/2017 CLINICAL DATA:  RIGHT breast cancer, high risk chemotherapy, baseline EXAM: NUCLEAR MEDICINE CARDIAC BLOOD POOL IMAGING (MUGA) TECHNIQUE: Cardiac multi-gated acquisition was performed at rest following intravenous injection of Tc-73mlabeled red blood cells. RADIOPHARMACEUTICALS:  21.38 mCi Tc-928mertechnetate in-vitro labeled autologous red blood cells IV COMPARISON:  None. FINDINGS: Calculated LEFT ventricular ejection fraction is 59%, which is within the normal range. Patient was rhythmic during imaging. Cine analysis of the LEFT ventricle demonstrates normal LEFT ventricular wall motion. IMPRESSION: Normal LEFT ventricular ejection fraction of  59%. Normal LV wall motion. Electronically Signed   By: MaLavonia Dana.D.   On: 03/12/2017 16:16   Nm Pet Image Initial (pi) Skull Base To Thigh  Result Date: 03/09/2017 CLINICAL DATA:  Initial treatment strategy for right breast cancer. EXAM: NUCLEAR MEDICINE PET SKULL BASE TO THIGH TECHNIQUE: 12.4 mCi F-18 FDG was injected intravenously. Full-ring PET imaging was performed from the skull base to thigh after the radiotracer. CT data was obtained and used for attenuation correction and anatomic localization. FASTING BLOOD GLUCOSE:  Value: 118 mg/dl COMPARISON:  Multiple exams, including CT abdomen from 05/02/2012 FINDINGS: NECK No hypermetabolic lymph nodes in the neck. Mild chronic left maxillary sinusitis. Low-level fairly symmetric activity in the palate and tonsils is highly likely to be physiologic. CHEST Hypermetabolic right axillary adenopathy. Index right axillary node 2.3 cm in short axis on image 67/3, maximum SUV 24.0. A separate right axillary/subpectoral lymph node adjacent to the axillary neurovascular structures measures 1.2 cm in short axis on image 51/ 3 and has a maximum SUV of 6.0, compatible with malignant involvement. There are  other small subpectoral lymph nodes on the right which only have low-grade activity but merit surveillance. In the upper portion of the right breast there some faint bandlike opacity adjacent to a small clip, maximum SUV in this vicinity 3.7, likely a postoperative region. No obvious worrisome lung nodules on the CT data. Upper normal size paratracheal and AP window lymph nodes do not demonstrate metabolic activity above the blood pool. ABDOMEN/PELVIS No abnormal hypermetabolic activity within the liver, pancreas, adrenal glands, or spleen. No hypermetabolic lymph nodes in the abdomen or pelvis. A 9 cm segment of the sigmoid colon demonstrates mildly accentuated metabolic activity, particularly proximally where the maximum SUV is 8.9. There is evidence of scattered  diverticula and possibly some subtle inflammation along its proximal region, for example images 200-207 of series 3. This may reflect mild diverticulitis but sigmoid colon tumor is not specifically excluded given this constellation of findings. Small retroperitoneal and pelvic lymph nodes are not pathologically enlarged or hypermetabolic. SKELETON No focal hypermetabolic activity to suggest skeletal metastasis. IMPRESSION: 1. Hypermetabolic right axillary/subpectoral adenopathy. Low-grade activity in the right upper breast likely at the postoperative site. Appearance compatible with metastatic spread to right axillary/subpectoral lymph nodes. 2. There is some sigmoid colon diverticulosis, with faint inflammatory findings adjacent to the sigmoid colon proximally, and with accentuated activity in the involved segment of the sigmoid colon. This is probably inflammatory activity. Strictly speaking, malignancy in the sigmoid colon could have a similar appearance. Following any therapy for potential mild diverticulitis, consider colonoscopy if not recently performed in order to further assess the sigmoid colon. 3.  Aortic Atherosclerosis (ICD10-I70.0). 4. Mild chronic left maxillary sinusitis. Electronically Signed   By: Van Clines M.D.   On: 03/09/2017 10:03   Dg Chest Port 1 View  Result Date: 03/15/2017 CLINICAL DATA:  Port-A-Cath insertion.  Right breast carcinoma EXAM: PORTABLE CHEST 1 VIEW COMPARISON:  PET-CT March 09, 2017 FINDINGS: Port-A-Cath tip is in the superior vena cava approximately 3 cm proximal to the cavoatrial junction. No pneumothorax. No edema or consolidation. Heart size and pulmonary vascularity are normal. No adenopathy. No bone lesions. IMPRESSION: Port-A-Cath as described without pneumothorax. No edema or consolidation. No evident adenopathy. Electronically Signed   By: Lowella Grip III M.D.   On: 03/15/2017 08:23   Dg C-arm 1-60 Min-no Report  Result Date:  03/15/2017 Fluoroscopy was utilized by the requesting physician.  No radiographic interpretation.   Mm Clip Placement Right  Result Date: 02/28/2017 CLINICAL DATA:  Status post ultrasound-guided biopsy of a right breast mass at 11 o'clock and of a right axillary lymph nodes EXAM: DIAGNOSTIC RIGHT MAMMOGRAM POST ULTRASOUND BIOPSY COMPARISON:  Previous exam(s). FINDINGS: Mammographic images were obtained following ultrasound guided biopsy of a suspicious right breast mass at 11 o'clock and of a right axillary lymph node. Post biopsy mammogram demonstrates the wing shaped biopsy marker to be within the right breast mass at 11 o'clock and the [facility] shape 4 butterfly marker to be in the expected location of the enlarged right axillary lymph node. IMPRESSION: Appropriate marker position as above. Final Assessment: Post Procedure Mammograms for Marker Placement Electronically Signed   By: Pamelia Hoit M.D.   On: 02/28/2017 09:22   [place] Rt Breast Bx W Loc Dev 1st Lesion Img Bx Spec US Guide  Addendum Date: 03/12/2017   ADDENDUM REPORT: 03/02/2017 13:32 ADDENDUM: Pathology of the right breast biopsy revealed A. RIGHT BREAST, 11:00, 8 CMFN; BIOPSY: INVASIVE MAMMARY CARCINOMA WITH MUCINOUS FEATURES. Size of invasive carcinoma: 1.4  cm in this sample. Histologic grade of invasive carcinoma: Grade 2. Glandular/tubular differentiation score: 3. Nuclear pleomorphism score: 3. Mitotic rate score: 1. ER/PR/HER2: Immunohistochemistry will be performed on block A1, with reflex to [place] for HER2 2+. The results will be reported in an addendum. Note: The diagnosis was called to Eino Farber of [facility] on 03/01/17 at 153 PM. This was found to be concordant by Dr. Brigitte Pulse. Recommendations:  Surgical and oncology referrals. Results and recommendations were relayed to Al Pimple, RN, nurse navigator and BCCCP coordinator for [facility] by Jetta Lout, [facility]). Ms. West Carbo  stated this is one of her patients within the [facility] program. She will contact the patient with results and arrange referrals. She will notify the radiologist at [facility] if the patient has any problems following the biopsy. An appointment has been made for the patient to see Dr. Bary Castilla on 03/06/17 at 4:45 PM. Addendum by Jetta Lout, [place] on 03/02/17. Electronically Signed   By: Pamelia Hoit M.D.   On: 03/02/2017 13:32   Result Date: 03/12/2017 CLINICAL DATA:  51 year old female with suspicious right breast mass at 11 o'clock, 8 cm from the nipple EXAM: ULTRASOUND GUIDED RIGHT BREAST CORE NEEDLE BIOPSY COMPARISON:  Previous exam(s). FINDINGS: I met with the patient and we discussed the procedure of ultrasound-guided biopsy, including benefits and alternatives. We discussed the high likelihood of a successful procedure. We discussed the risks of the procedure, including infection, bleeding, tissue injury, clip migration, and inadequate sampling. Informed written consent was given. The usual time-out protocol was performed immediately prior to the procedure. Lesion quadrant: Upper outer quadrant Using sterile technique and 1% Lidocaine as local anesthetic, under direct ultrasound visualization, a 12 gauge spring-loaded device was used to perform biopsy of a suspicious right breast mass at 11 o'clock, 8 cm from the nipple using a lateral to medial approach. At the conclusion of the procedure a wing shaped tissue marker clip was deployed into the biopsy cavity. Follow up 2 view mammogram was performed and dictated separately. IMPRESSION: Ultrasound guided biopsy of a suspicious right breast mass at 11 o'clock, 8 cm from the nipple. No apparent complications. Electronically Signed: By: Pamelia Hoit M.D. On: 02/28/2017 09:19   [place] Rt Breast Bx W Loc Dev Ea Add Lesion Img Bx Spec US Guide  Addendum Date: 03/12/2017   ADDENDUM REPORT: 03/02/2017 13:31 ADDENDUM: Pathology of the right axillary lymph node  revealed B. LYMPH NODE, RIGHT AXILLARY; BIOPSY: METASTATIC MAMMARY CARCINOMA. Note: The diagnosis was called to Eino Farber of [facility] on 03/01/17 at 153 PM. This was found to be concordant by Dr. Brigitte Pulse. Recommendations:  Surgical and oncology referrals. Results and recommendations were relayed to Al Pimple, RN, nurse navigator and BCCCP coordinator for [facility] by Jetta Lout, [facility]iology). She stated this is one of her patients through the [facility] program. She will contact the patient with results and will make the referrals. She will contact the radiologist at the [facility] if the patient has any problems at the biopsy sites. An appointment has been made with Dr. Bary Castilla for 03/06/17 at 4:45 PM. Addendum by Jetta Lout, [place] on 03/02/17. Electronically Signed   By: Pamelia Hoit M.D.   On: 03/02/2017 13:31   Result Date: 03/12/2017 CLINICAL DATA:  51 year old female with suspicious right axillary lymph node EXAM: ULTRASOUND GUIDED CORE NEEDLE BIOPSY OF A RIGHT AXILLARY NODE COMPARISON:  Previous exam(s). FINDINGS:  I met with the patient and we discussed the procedure of ultrasound-guided biopsy, including benefits and alternatives. We discussed the high likelihood of a successful procedure. We discussed the risks of the procedure, including infection, bleeding, tissue injury, clip migration, and inadequate sampling. Informed written consent was given. The usual time-out protocol was performed immediately prior to the procedure. Using sterile technique and 1% Lidocaine as local anesthetic, under direct ultrasound visualization, a 14 gauge spring-loaded device was used to perform biopsy of a suspicious right axillary lymph node using a lateral to medial approach. At the conclusion of the procedure a HydroMARK shape 4 butterfly tissue marker clip was deployed into the biopsy cavity. Follow up 2 view mammogram was performed and dictated  separately. IMPRESSION: Ultrasound guided biopsy of a suspicious right axillary lymph node. No apparent complications. Electronically Signed: By: Pamelia Hoit M.D. 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  PLAN:   1. Clinical stage IIA ER/PR positive, HER-2 2+ invasive carcinoma of the upper outer quadrant of the right breast: Given the stage of the patient's tumor and biopsy-proven lymph nodes, it is recommended that she proceed with neoadjuvant chemotherapy with dose dense Adriamycin Cytoxan followed by weekly Taxol 12. Pretreatment MUGA revealed an EF of 59%. PET scan results reviewed independently and reported as above with no obvious metastatic disease, but FDG positive area and her sigmoid colon. After the completion of her neoadjuvant chemotherapy, she will require definitive surgery and possibly adjuvant XRT. Finally, given the ER/PR status of her tumor she will benefit from either tamoxifen or an aromatase inhibitor for total 5 years. Proceed with cycle 1 of 4 of Adriamycin and Cytoxan tomorrow. Return to clinic in 1 week for further evaluation and laboratory work and then in 2 weeks for consideration of cycle 2. 2. Genetic testing: Given patient's age of under 64, will order genetic testing at patient's next clinic visit. 3. Sigmoid colon: PET positivity noted, likely physiologic or possibly diverticulosis. Once patient completes her treatment for breast cancer Will refer for colonoscopy for further evaluation.  Patient expressed understanding and was in agreement with this plan. She also understands that She can call clinic at any time with any questions, concerns, or complaints.   Cancer Staging Malignant neoplasm of upper-outer quadrant of right breast in female, estrogen receptor positive ([place]) Staging form: Breast, AJCC 8th Edition - Clinical stage from 03/05/2017: Stage IIA (cT2,  cN2a, cM0, G2, ER: Positive, PR: Positive, HER2: Negative) - Signed by Lloyd Huger, MD on 03/19/2017   Lloyd Huger, MD   03/21/2017 2:37 PM

## 2017-03-20 ENCOUNTER — Inpatient Hospital Stay (HOSPITAL_BASED_OUTPATIENT_CLINIC_OR_DEPARTMENT_OTHER): Payer: Medicaid Other | Admitting: Oncology

## 2017-03-20 ENCOUNTER — Inpatient Hospital Stay: Payer: Medicaid Other | Attending: Oncology

## 2017-03-20 ENCOUNTER — Ambulatory Visit (HOSPITAL_COMMUNITY)
Admission: RE | Admit: 2017-03-20 | Discharge: 2017-03-20 | Disposition: A | Discharge status: Home / Self Care | Payer: Medicaid Other | Source: Ambulatory Visit | Attending: Oncology | Admitting: Oncology

## 2017-03-20 ENCOUNTER — Encounter: Payer: Self-pay | Admitting: Oncology

## 2017-03-20 ENCOUNTER — Other Ambulatory Visit: Payer: Self-pay

## 2017-03-20 VITALS — BP 123/85 | HR 92 | Temp 97.9°F | Ht 67.0 in | Wt 240.9 lb

## 2017-03-20 DIAGNOSIS — Z170 Estrogen receptor positive status [ER+]: Secondary | ICD-10-CM | POA: Insufficient documentation

## 2017-03-20 DIAGNOSIS — K5730 Diverticulosis of large intestine without perforation or abscess without bleeding: Secondary | ICD-10-CM

## 2017-03-20 DIAGNOSIS — Z803 Family history of malignant neoplasm of breast: Secondary | ICD-10-CM | POA: Diagnosis not present

## 2017-03-20 DIAGNOSIS — R933 Abnormal findings on diagnostic imaging of other parts of digestive tract: Secondary | ICD-10-CM | POA: Diagnosis not present

## 2017-03-20 DIAGNOSIS — C50411 Malignant neoplasm of upper-outer quadrant of right female breast: Secondary | ICD-10-CM

## 2017-03-20 DIAGNOSIS — J320 Chronic maxillary sinusitis: Secondary | ICD-10-CM | POA: Diagnosis not present

## 2017-03-20 DIAGNOSIS — F419 Anxiety disorder, unspecified: Secondary | ICD-10-CM | POA: Diagnosis not present

## 2017-03-20 DIAGNOSIS — R05 Cough: Secondary | ICD-10-CM | POA: Insufficient documentation

## 2017-03-20 DIAGNOSIS — M129 Arthropathy, unspecified: Secondary | ICD-10-CM | POA: Insufficient documentation

## 2017-03-20 DIAGNOSIS — Z800 Family history of malignant neoplasm of digestive organs: Secondary | ICD-10-CM | POA: Insufficient documentation

## 2017-03-20 DIAGNOSIS — Z006 Encounter for examination for normal comparison and control in clinical research program: Secondary | ICD-10-CM

## 2017-03-20 DIAGNOSIS — Z5111 Encounter for antineoplastic chemotherapy: Secondary | ICD-10-CM | POA: Insufficient documentation

## 2017-03-20 DIAGNOSIS — E669 Obesity, unspecified: Secondary | ICD-10-CM | POA: Diagnosis not present

## 2017-03-20 DIAGNOSIS — R590 Localized enlarged lymph nodes: Secondary | ICD-10-CM | POA: Diagnosis not present

## 2017-03-20 DIAGNOSIS — Z7689 Persons encountering health services in other specified circumstances: Secondary | ICD-10-CM

## 2017-03-20 DIAGNOSIS — I499 Cardiac arrhythmia, unspecified: Secondary | ICD-10-CM | POA: Insufficient documentation

## 2017-03-20 DIAGNOSIS — I700 Atherosclerosis of aorta: Secondary | ICD-10-CM | POA: Insufficient documentation

## 2017-03-20 DIAGNOSIS — F17210 Nicotine dependence, cigarettes, uncomplicated: Secondary | ICD-10-CM

## 2017-03-20 DIAGNOSIS — I10 Essential (primary) hypertension: Secondary | ICD-10-CM

## 2017-03-20 LAB — COMPREHENSIVE METABOLIC PANEL
ALK PHOS: 105 U/L (ref 38–126)
ALT: 25 U/L (ref 14–54)
AST: 22 U/L (ref 15–41)
Albumin: 3.7 g/dL (ref 3.5–5.0)
Anion gap: 6 (ref 5–15)
BILIRUBIN TOTAL: 0.5 mg/dL (ref 0.3–1.2)
BUN: 24 mg/dL — ABNORMAL HIGH (ref 6–20)
CALCIUM: 9.4 mg/dL (ref 8.9–10.3)
CO2: 26 mmol/L (ref 22–32)
Chloride: 105 mmol/L (ref 101–111)
Creatinine, Ser: 0.84 mg/dL (ref 0.44–1.00)
Glucose, Bld: 134 mg/dL — ABNORMAL HIGH (ref 65–99)
Potassium: 3.9 mmol/L (ref 3.5–5.1)
Sodium: 137 mmol/L (ref 135–145)
TOTAL PROTEIN: 8 g/dL (ref 6.5–8.1)

## 2017-03-20 LAB — CBC WITH DIFFERENTIAL/PLATELET
Basophils Absolute: 0 10*3/uL (ref 0–0.1)
Basophils Relative: 1 %
Eosinophils Absolute: 0.2 10*3/uL (ref 0–0.7)
Eosinophils Relative: 3 %
HCT: 38.3 % (ref 35.0–47.0)
HEMOGLOBIN: 12.7 g/dL (ref 12.0–16.0)
LYMPHS PCT: 26 %
Lymphs Abs: 2.1 10*3/uL (ref 1.0–3.6)
MCH: 24.5 pg — AB (ref 26.0–34.0)
MCHC: 33 g/dL (ref 32.0–36.0)
MCV: 74.3 fL — AB (ref 80.0–100.0)
MONOS PCT: 6 %
Monocytes Absolute: 0.5 10*3/uL (ref 0.2–0.9)
NEUTROS PCT: 64 %
Neutro Abs: 5.3 10*3/uL (ref 1.4–6.5)
Platelets: 323 10*3/uL (ref 150–440)
RBC: 5.16 MIL/uL (ref 3.80–5.20)
RDW: 18.1 % — ABNORMAL HIGH (ref 11.5–14.5)
WBC: 8.2 10*3/uL (ref 3.6–11.0)

## 2017-03-20 LAB — GLUCOSE, RANDOM: GLUCOSE: 119 mg/dL — AB (ref 65–99)

## 2017-03-20 MED ORDER — ONDANSETRON HCL 8 MG PO TABS: 8.0000 mg | ORAL_TABLET | Freq: Two times a day (BID) | ORAL | 3 refills | Status: DC | PRN

## 2017-03-20 MED ORDER — LIDOCAINE-PRILOCAINE 2.5-2.5 % EX CREA: TOPICAL_CREAM | CUTANEOUS | 3 refills | Status: DC

## 2017-03-20 MED ORDER — PROCHLORPERAZINE MALEATE 10 MG PO TABS: 10.0000 mg | ORAL_TABLET | Freq: Four times a day (QID) | ORAL | 3 refills | Status: DC | PRN

## 2017-03-20 NOTE — Progress Notes (Signed)
  Oncology Nurse Navigator Documentation  Navigator Location: [facility] (03/20/17 1100)   )Navigator Encounter Type: Clinic/MDC (03/20/17 1100) Telephone: Medication Assistance;Financial Assistance (03/20/17 1100)                       Barriers/Navigation Needs: Financial;Coordination of Care (03/20/17 1100)   Interventions: Medication Assistance (03/20/17 1100)   Coordination of Care: Other (03/20/17 1100)                  Time Spent with Patient: 30 (03/20/17 1100)  Met patient in clinic.  Waiting to hear from [facility].  Phoned Orvan July at [facility] to determine Medicaid status.  Will assist patient with Emla Cream,and Zofran through [employer].  Yolande Jolly working with patient to see if she qualifies for Prevent study.  She will receive her first chemotherapy this week.

## 2017-03-21 ENCOUNTER — Inpatient Hospital Stay: Payer: Medicaid Other

## 2017-03-21 ENCOUNTER — Other Ambulatory Visit: Payer: Self-pay

## 2017-03-21 ENCOUNTER — Ambulatory Visit: Payer: Self-pay | Admitting: Oncology

## 2017-03-21 ENCOUNTER — Encounter: Payer: Self-pay | Admitting: *Deleted

## 2017-03-21 LAB — THYROID PANEL WITH TSH
Free Thyroxine Index: 3.8 (ref 1.2–4.9)
T3 UPTAKE RATIO: 29 % (ref 24–39)
T4, Total: 13.1 ug/dL — ABNORMAL HIGH (ref 4.5–12.0)
TSH: <0.006 u[IU]/mL — ABNORMAL LOW (ref 0.450–4.500)

## 2017-03-22 ENCOUNTER — Inpatient Hospital Stay: Payer: Medicaid Other

## 2017-03-22 DIAGNOSIS — C50411 Malignant neoplasm of upper-outer quadrant of right female breast: Secondary | ICD-10-CM

## 2017-03-22 DIAGNOSIS — Z5111 Encounter for antineoplastic chemotherapy: Secondary | ICD-10-CM | POA: Diagnosis not present

## 2017-03-22 DIAGNOSIS — Z170 Estrogen receptor positive status [ER+]: Principal | ICD-10-CM

## 2017-03-22 MED ORDER — HEPARIN SOD (PORK) LOCK FLUSH 100 UNIT/ML IV SOLN
500.0000 [IU] | Freq: Once | INTRAVENOUS | Status: AC | PRN
  Administered 2017-03-22: 500 [IU]
  Filled 2017-03-22 (×2): qty 5

## 2017-03-22 MED ORDER — PEGFILGRASTIM 6 MG/0.6ML ~~LOC~~ PSKT
6.0000 mg | PREFILLED_SYRINGE | Freq: Once | SUBCUTANEOUS | Status: AC
  Administered 2017-03-22: 6 mg via SUBCUTANEOUS
  Filled 2017-03-22: qty 0.6

## 2017-03-22 MED ORDER — SODIUM CHLORIDE 0.9 % IV SOLN
Freq: Once | INTRAVENOUS | Status: AC
  Administered 2017-03-22: 11:00:00 via INTRAVENOUS
  Filled 2017-03-22: qty 1000

## 2017-03-22 MED ORDER — PALONOSETRON HCL INJECTION 0.25 MG/5ML
0.2500 mg | Freq: Once | INTRAVENOUS | Status: AC
  Administered 2017-03-22: 0.25 mg via INTRAVENOUS
  Filled 2017-03-22: qty 5

## 2017-03-22 MED ORDER — SODIUM CHLORIDE 0.9 % IV SOLN
Freq: Once | INTRAVENOUS | Status: AC
  Administered 2017-03-22: 11:00:00 via INTRAVENOUS
  Filled 2017-03-22: qty 5

## 2017-03-22 MED ORDER — SODIUM CHLORIDE 0.9 % IV SOLN
600.0000 mg/m2 | Freq: Once | INTRAVENOUS | Status: AC
  Administered 2017-03-22: 1380 mg via INTRAVENOUS
  Filled 2017-03-22: qty 19

## 2017-03-22 MED ORDER — DOXORUBICIN HCL CHEMO IV INJECTION 2 MG/ML
60.0000 mg/m2 | Freq: Once | INTRAVENOUS | Status: AC
  Administered 2017-03-22: 138 mg via INTRAVENOUS
  Filled 2017-03-22: qty 69

## 2017-03-28 ENCOUNTER — Inpatient Hospital Stay: Payer: Medicaid Other

## 2017-03-28 ENCOUNTER — Inpatient Hospital Stay: Payer: Medicaid Other | Admitting: Oncology

## 2017-03-28 NOTE — Progress Notes (Deleted)
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  INTERVAL HISTORY: Patient returns to clinic today for further evaluation and consideration of cycle 1 of 4 of Adriamycin and Cytoxan. She is anxious, but otherwise feels well. She has no neurologic complaints. She denies any recent fevers or illnesses. She has a good appetite and denies weight loss. She has no chest pain or shortness of breath. She denies any nausea, vomiting, constipation, or diarrhea. She has no urinary complaints. Patient offers no further specific complaints.  REVIEW OF SYSTEMS:   Review of Systems  Constitutional: Negative.  Negative for fever, malaise/fatigue and weight loss.  Respiratory: Negative.  Negative for cough.   Cardiovascular: Negative.  Negative for chest pain and leg swelling.  Gastrointestinal: Negative.  Negative for abdominal pain.  Genitourinary: Negative.   Musculoskeletal: Negative.   Skin: Negative.  Negative for rash.  Neurological: Negative.  Negative for weakness.  Psychiatric/Behavioral: The patient is nervous/anxious.     As per HPI. Otherwise, a complete review of systems is negative.  PAST MEDICAL HISTORY: Past Medical History:  Diagnosis Date  . Arthritis    SHOULDER  . Cancer ([place]) 02/28/2017   INVASIVE MAMMARY CARCINOMA WITH MUCINOUS FEATURES.  . Hypertension   . Irregular heart beat    PT STATES IT "SKIPS A BEAT"   . Obesity     PAST SURGICAL HISTORY: Past Surgical History:  Procedure Laterality Date  . ANKLE SURGERY    . BREAST BIOPSY Right  02/28/2017   INVASIVE MAMMARY CARCINOMA WITH MUCINOUS FEATURES.  Marland Kitchen BREAST CYST ASPIRATION Right    NEG  . PORTACATH PLACEMENT Left 03/15/2017   Procedure: INSERTION PORT-A-CATH;  Surgeon: Robert Bellow, MD;  Location: [facility] ORS;  Service: General;  Laterality: Left;    FAMILY HISTORY: Family History  Problem Relation Age of Onset  . Diabetes Father   . Stroke Father   . Breast cancer Mother 49  . Brain cancer Maternal Aunt 60  . Colon cancer Neg Hx     ADVANCED DIRECTIVES (Y/N):  N  HEALTH MAINTENANCE: Social History  Substance Use Topics  . Smoking status: Current Every Day Smoker    Packs/day: 0.50    Years: 25.00    Types: Cigarettes  . Smokeless tobacco: Never Used  . Alcohol use Yes     Comment: occas     Colonoscopy:  PAP:  Bone density:  Lipid panel:  Allergies  Allergen Reactions  . Other Hives and Itching    [name]     Current Outpatient Prescriptions  Medication Sig Dispense Refill  . acetaminophen (TYLENOL) 500 MG tablet Take 1,000 mg by mouth every 6 (six) hours as needed for mild pain.    Marland Kitchen amLODipine (NORVASC) 5 MG tablet Take 1 tablet (5 mg total) by mouth daily. (Patient taking differently: Take 5 mg by mouth every morning. ) 90 tablet 3  . lidocaine-prilocaine (EMLA) cream Apply to affected area once 30 g 3  . ondansetron (ZOFRAN) 8 MG tablet  Take 1 tablet (8 mg total) by mouth 2 (two) times daily as needed. 60 tablet 3  . prochlorperazine (COMPAZINE) 10 MG tablet Take 1 tablet (10 mg total) by mouth every 6 (six) hours as needed (Nausea or vomiting). 60 tablet 3  . triamterene-hydrochlorothiazide (MAXZIDE-25) 37.5-25 MG tablet Take 1 tablet by mouth daily. 90 tablet 3   No current facility-administered medications for this visit.     OBJECTIVE: There were no vitals filed for this visit.   There is no height or weight on file to calculate  BMI.    ECOG FS:0 - Asymptomatic  General: Well-developed, well-nourished, no acute distress. Eyes: Pink conjunctiva, anicteric sclera. Breasts: Patient requested exam be deferred today. Lungs: Clear to auscultation bilaterally. Heart: Regular rate and rhythm. No rubs, murmurs, or gallops. Abdomen: Soft, nontender, nondistended. No organomegaly noted, normoactive bowel sounds. Musculoskeletal: No edema, cyanosis, or clubbing. Neuro: Alert, answering all questions appropriately. Cranial nerves grossly intact. Skin: No rashes or petechiae noted. Psych: Normal affect.   LAB RESULTS:  Lab Results  Component Value Date   NA 137 03/20/2017   K 3.9 03/20/2017   CL 105 03/20/2017   CO2 26 03/20/2017   GLUCOSE 119 (H) 03/20/2017   BUN 24 (H) 03/20/2017   CREATININE 0.84 03/20/2017   CALCIUM 9.4 03/20/2017   PROT 8.0 03/20/2017   ALBUMIN 3.7 03/20/2017   AST 22 03/20/2017   ALT 25 03/20/2017   ALKPHOS 105 03/20/2017   BILITOT 0.5 03/20/2017   GFRNONAA >60 03/20/2017   GFRAA >60 03/20/2017    Lab Results  Component Value Date   WBC 8.2 03/20/2017   NEUTROABS 5.3 03/20/2017   HGB 12.7 03/20/2017   HCT 38.3 03/20/2017   MCV 74.3 (L) 03/20/2017   PLT 323 03/20/2017     STUDIES: Nm Cardiac Muga Rest  Result Date: 03/12/2017 CLINICAL DATA:  RIGHT breast cancer, high risk chemotherapy, baseline EXAM: NUCLEAR MEDICINE CARDIAC BLOOD POOL IMAGING (MUGA) TECHNIQUE: Cardiac multi-gated acquisition was performed at rest following intravenous injection of Tc-39mlabeled red blood cells. RADIOPHARMACEUTICALS:  21.38 mCi Tc-91mertechnetate in-vitro labeled autologous red blood cells IV COMPARISON:  None. FINDINGS: Calculated LEFT ventricular ejection fraction is 59%, which is within the normal range. Patient was rhythmic during imaging. Cine analysis of the LEFT ventricle demonstrates normal LEFT ventricular wall motion. IMPRESSION: Normal LEFT ventricular ejection fraction of 59%. Normal  LV wall motion. Electronically Signed   By: MaLavonia Dana.D.   On: 03/12/2017 16:16   Nm Pet Image Initial (pi) Skull Base To Thigh  Result Date: 03/09/2017 CLINICAL DATA:  Initial treatment strategy for right breast cancer. EXAM: NUCLEAR MEDICINE PET SKULL BASE TO THIGH TECHNIQUE: 12.4 mCi F-18 FDG was injected intravenously. Full-ring PET imaging was performed from the skull base to thigh after the radiotracer. CT data was obtained and used for attenuation correction and anatomic localization. FASTING BLOOD GLUCOSE:  Value: 118 mg/dl COMPARISON:  Multiple exams, including CT abdomen from 05/02/2012 FINDINGS: NECK No hypermetabolic lymph nodes in the neck. Mild chronic left maxillary sinusitis. Low-level fairly symmetric activity in the palate and tonsils is highly likely to be physiologic. CHEST Hypermetabolic right axillary adenopathy. Index right axillary node 2.3 cm in short axis on image 67/3, maximum SUV 24.0. A separate right axillary/subpectoral lymph node adjacent to the axillary neurovascular structures measures 1.2 cm in short axis on image 51/ 3 and has a maximum SUV of 6.0, compatible with malignant involvement. There are other small subpectoral lymph nodes on  the right which only have low-grade activity but merit surveillance. In the upper portion of the right breast there some faint bandlike opacity adjacent to a small clip, maximum SUV in this vicinity 3.7, likely a postoperative region. No obvious worrisome lung nodules on the CT data. Upper normal size paratracheal and AP window lymph nodes do not demonstrate metabolic activity above the blood pool. ABDOMEN/PELVIS No abnormal hypermetabolic activity within the liver, pancreas, adrenal glands, or spleen. No hypermetabolic lymph nodes in the abdomen or pelvis. A 9 cm segment of the sigmoid colon demonstrates mildly accentuated metabolic activity, particularly proximally where the maximum SUV is 8.9. There is evidence of scattered diverticula and  possibly some subtle inflammation along its proximal region, for example images 200-207 of series 3. This may reflect mild diverticulitis but sigmoid colon tumor is not specifically excluded given this constellation of findings. Small retroperitoneal and pelvic lymph nodes are not pathologically enlarged or hypermetabolic. SKELETON No focal hypermetabolic activity to suggest skeletal metastasis. IMPRESSION: 1. Hypermetabolic right axillary/subpectoral adenopathy. Low-grade activity in the right upper breast likely at the postoperative site. Appearance compatible with metastatic spread to right axillary/subpectoral lymph nodes. 2. There is some sigmoid colon diverticulosis, with faint inflammatory findings adjacent to the sigmoid colon proximally, and with accentuated activity in the involved segment of the sigmoid colon. This is probably inflammatory activity. Strictly speaking, malignancy in the sigmoid colon could have a similar appearance. Following any therapy for potential mild diverticulitis, consider colonoscopy if not recently performed in order to further assess the sigmoid colon. 3.  Aortic Atherosclerosis (ICD10-I70.0). 4. Mild chronic left maxillary sinusitis. Electronically Signed   By: Van Clines M.D.   On: 03/09/2017 10:03   Dg Chest Port 1 View  Result Date: 03/15/2017 CLINICAL DATA:  Port-A-Cath insertion.  Right breast carcinoma EXAM: PORTABLE CHEST 1 VIEW COMPARISON:  PET-CT March 09, 2017 FINDINGS: Port-A-Cath tip is in the superior vena cava approximately 3 cm proximal to the cavoatrial junction. No pneumothorax. No edema or consolidation. Heart size and pulmonary vascularity are normal. No adenopathy. No bone lesions. IMPRESSION: Port-A-Cath as described without pneumothorax. No edema or consolidation. No evident adenopathy. Electronically Signed   By: Lowella Grip III M.D.   On: 03/15/2017 08:23   Dg C-arm 1-60 Min-no Report  Result Date: 03/15/2017 Fluoroscopy was  utilized by the requesting physician.  No radiographic interpretation.   Mm Clip Placement Right  Result Date: 02/28/2017 CLINICAL DATA:  Status post ultrasound-guided biopsy of a right breast mass at 11 o'clock and of a right axillary lymph nodes EXAM: DIAGNOSTIC RIGHT MAMMOGRAM POST ULTRASOUND BIOPSY COMPARISON:  Previous exam(s). FINDINGS: Mammographic images were obtained following ultrasound guided biopsy of a suspicious right breast mass at 11 o'clock and of a right axillary lymph node. Post biopsy mammogram demonstrates the wing shaped biopsy marker to be within the right breast mass at 11 o'clock and the [facility] shape 4 butterfly marker to be in the expected location of the enlarged right axillary lymph node. IMPRESSION: Appropriate marker position as above. Final Assessment: Post Procedure Mammograms for Marker Placement Electronically Signed   By: Pamelia Hoit M.D.   On: 02/28/2017 09:22   [place] Rt Breast Bx W Loc Dev 1st Lesion Img Bx Spec US Guide  Addendum Date: 03/12/2017   ADDENDUM REPORT: 03/02/2017 13:32 ADDENDUM: Pathology of the right breast biopsy revealed A. RIGHT BREAST, 11:00, 8 CMFN; BIOPSY: INVASIVE MAMMARY CARCINOMA WITH MUCINOUS FEATURES. Size of invasive carcinoma: 1.4 cm in this sample. Histologic grade  of invasive carcinoma: Grade 2. Glandular/tubular differentiation score: 3. Nuclear pleomorphism score: 3. Mitotic rate score: 1. ER/PR/HER2: Immunohistochemistry will be performed on block A1, with reflex to [place] for HER2 2+. The results will be reported in an addendum. Note: The diagnosis was called to Eino Farber of [facility] on 03/01/17 at 153 PM. This was found to be concordant by Dr. Brigitte Pulse. Recommendations:  Surgical and oncology referrals. Results and recommendations were relayed to Al Pimple, RN, nurse navigator and BCCCP coordinator for [facility] by Jetta Lout, [facility]). Ms. West Carbo stated this is one of her  patients within the [facility] program. She will contact the patient with results and arrange referrals. She will notify the radiologist at [facility] if the patient has any problems following the biopsy. An appointment has been made for the patient to see Dr. Bary Castilla on 03/06/17 at 4:45 PM. Addendum by Jetta Lout, [address] on 03/02/17. Electronically Signed   By: Pamelia Hoit M.D.   On: 03/02/2017 13:32   Result Date: 03/12/2017 CLINICAL DATA:  51 year old female with suspicious right breast mass at 11 o'clock, 8 cm from the nipple EXAM: ULTRASOUND GUIDED RIGHT BREAST CORE NEEDLE BIOPSY COMPARISON:  Previous exam(s). FINDINGS: I met with the patient and we discussed the procedure of ultrasound-guided biopsy, including benefits and alternatives. We discussed the high likelihood of a successful procedure. We discussed the risks of the procedure, including infection, bleeding, tissue injury, clip migration, and inadequate sampling. Informed written consent was given. The usual time-out protocol was performed immediately prior to the procedure. Lesion quadrant: Upper outer quadrant Using sterile technique and 1% Lidocaine as local anesthetic, under direct ultrasound visualization, a 12 gauge spring-loaded device was used to perform biopsy of a suspicious right breast mass at 11 o'clock, 8 cm from the nipple using a lateral to medial approach. At the conclusion of the procedure a wing shaped tissue marker clip was deployed into the biopsy cavity. Follow up 2 view mammogram was performed and dictated separately. IMPRESSION: Ultrasound guided biopsy of a suspicious right breast mass at 11 o'clock, 8 cm from the nipple. No apparent complications. Electronically Signed: By: Pamelia Hoit M.D. On: 02/28/2017 09:19   [place] Rt Breast Bx W Loc Dev Ea Add Lesion Img Bx Spec US Guide  Addendum Date: 03/12/2017   ADDENDUM REPORT: 03/02/2017 13:31 ADDENDUM: Pathology of the right axillary lymph node revealed B. LYMPH NODE, RIGHT  AXILLARY; BIOPSY: METASTATIC MAMMARY CARCINOMA. Note: The diagnosis was called to Eino Farber of [facility] on 03/01/17 at 153 PM. This was found to be concordant by Dr. Brigitte Pulse. Recommendations:  Surgical and oncology referrals. Results and recommendations were relayed to Al Pimple, RN, nurse navigator and BCCCP coordinator for [facility] by Jetta Lout, [facility]). She stated this is one of her patients through the [facility] program. She will contact the patient with results and will make the referrals. She will contact the radiologist at the [facility] if the patient has any problems at the biopsy sites. An appointment has been made with Dr. Bary Castilla for 03/06/17 at 4:45 PM. Addendum by Jetta Lout, [place] on 03/02/17. Electronically Signed   By: Pamelia Hoit M.D.   On: 03/02/2017 13:31   Result Date: 03/12/2017 CLINICAL DATA:  50 year old female with suspicious right axillary lymph node EXAM: ULTRASOUND GUIDED CORE NEEDLE BIOPSY OF A RIGHT AXILLARY NODE COMPARISON:  Previous exam(s). FINDINGS: I met with the patient and  we discussed the procedure of ultrasound-guided biopsy, including benefits and alternatives. We discussed the high likelihood of a successful procedure. We discussed the risks of the procedure, including infection, bleeding, tissue injury, clip migration, and inadequate sampling. Informed written consent was given. The usual time-out protocol was performed immediately prior to the procedure. Using sterile technique and 1% Lidocaine as local anesthetic, under direct ultrasound visualization, a 14 gauge spring-loaded device was used to perform biopsy of a suspicious right axillary lymph node using a lateral to medial approach. At the conclusion of the procedure a HydroMARK shape 4 butterfly tissue marker clip was deployed into the biopsy cavity. Follow up 2 view mammogram was performed and dictated separately. IMPRESSION: Ultrasound  guided biopsy of a suspicious right axillary lymph node. No apparent complications. Electronically Signed: By: Pamelia Hoit M.D. 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  PLAN:   1. Clinical stage IIA ER/PR positive, HER-2 2+ invasive carcinoma of the upper outer quadrant of the right breast: Given the stage of the patient's tumor and biopsy-proven lymph nodes, it is recommended that she proceed with neoadjuvant chemotherapy with dose dense Adriamycin Cytoxan followed by weekly Taxol 12. Pretreatment MUGA revealed an EF of 59%. PET scan results reviewed independently and reported as above with no obvious metastatic disease, but FDG positive area and her sigmoid colon. After the completion of her neoadjuvant chemotherapy, she will require definitive surgery and possibly adjuvant XRT. Finally, given the ER/PR status of her tumor she will benefit from either tamoxifen or an aromatase inhibitor for total 5 years. Proceed with cycle 1 of 4 of Adriamycin and Cytoxan tomorrow. Return to clinic in 1 week for further evaluation and laboratory work and then in 2 weeks for consideration of cycle 2. 2. Genetic testing: Given patient's age of under 53, will order genetic testing at patient's next clinic visit. 3. Sigmoid colon: PET positivity noted, likely physiologic or possibly diverticulosis. Once patient completes her treatment for breast cancer Will refer for colonoscopy for further evaluation.  Patient expressed understanding and was in agreement with this plan. She also understands that She can call clinic at any time with any questions, concerns, or complaints.   Cancer Staging Malignant neoplasm of upper-outer quadrant of right breast in female, estrogen receptor positive ([place]) Staging form: Breast, AJCC 8th Edition - Clinical stage from 03/05/2017: Stage IIA (cT2, cN2a, cM0, G2, ER: Positive, PR:  Positive, HER2: Negative) - Signed by Lloyd Huger, MD on 03/19/2017   Lloyd Huger, MD   03/28/2017 8:32 AM

## 2017-03-29 ENCOUNTER — Telehealth: Payer: Self-pay | Admitting: *Deleted

## 2017-03-29 MED ORDER — AMOXICILLIN-POT CLAVULANATE 875-125 MG PO TABS: 1.0000 | ORAL_TABLET | Freq: Two times a day (BID) | ORAL | 0 refills | Status: DC

## 2017-03-29 NOTE — Telephone Encounter (Signed)
Per Dr Grayland Ormond, Augmentin 875 mg BID times 5 days. Patient informed and asked it be sent to [id number] on [address]

## 2017-03-29 NOTE — Telephone Encounter (Signed)
Called to report that she is having blood and pus from her gums. She went to see her dentist who would onmly do a "mouth rinse" , would not prescribe abx, or pain med because she is on chemotherapy and told her to call us. She is having pain from this as well and si requesting we do something for her. Her last chemo was on 5/3/1/. She got AC. Please advise

## 2017-04-03 NOTE — Progress Notes (Signed)
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  INTERVAL HISTORY: Patient returns to clinic today for further evaluation and consideration of cycle 2 of 4 of Adriamycin and Cytoxan. She tolerated her first treatment well without significant side effects. She continues to be anxious, but otherwise feels well. She has no neurologic complaints. She denies any recent fevers or illnesses. She has a good appetite and denies weight loss. She has no chest pain or shortness of breath. She denies any nausea, vomiting, constipation, or diarrhea. She has no urinary complaints. Patient offers no specific complaints.  REVIEW OF SYSTEMS:   Review of Systems  Constitutional: Negative.  Negative for fever, malaise/fatigue and weight loss.  Respiratory: Negative.  Negative for cough.   Cardiovascular: Negative.  Negative for chest pain and leg swelling.  Gastrointestinal: Negative.  Negative for abdominal pain.  Genitourinary: Negative.   Musculoskeletal: Negative.   Skin: Negative.  Negative for rash.  Neurological: Negative.  Negative for weakness.  Psychiatric/Behavioral: The patient is nervous/anxious.     As per HPI. Otherwise, a complete review of systems is negative.  PAST MEDICAL HISTORY: Past Medical History:  Diagnosis Date  . Arthritis    SHOULDER  . Cancer ([place]) 02/28/2017   INVASIVE MAMMARY CARCINOMA WITH MUCINOUS FEATURES.  . Hypertension   . Irregular heart beat    PT STATES IT "SKIPS A BEAT"   . Obesity     PAST SURGICAL HISTORY: Past  Surgical History:  Procedure Laterality Date  . ANKLE SURGERY    . BREAST BIOPSY Right 02/28/2017   INVASIVE MAMMARY CARCINOMA WITH MUCINOUS FEATURES.  Marland Kitchen BREAST CYST ASPIRATION Right    NEG  . PORTACATH PLACEMENT Left 03/15/2017   Procedure: INSERTION PORT-A-CATH;  Surgeon: Robert Bellow, MD;  Location: [facility] ORS;  Service: General;  Laterality: Left;    FAMILY HISTORY: Family History  Problem Relation Age of Onset  . Diabetes Father   . Stroke Father   . Breast cancer Mother 60  . Brain cancer Maternal Aunt 60  . Colon cancer Neg Hx     ADVANCED DIRECTIVES (Y/N):  N  HEALTH MAINTENANCE: Social History  Substance Use Topics  . Smoking status: Current Every Day Smoker    Packs/day: 0.25    Years: 25.00    Types: Cigarettes  . Smokeless tobacco: Never Used  . Alcohol use Yes     Comment: occas     Colonoscopy:  PAP:  Bone density:  Lipid panel:  Allergies  Allergen Reactions  . Other Hives and Itching    [name]     Current Outpatient Prescriptions  Medication Sig Dispense Refill  . lidocaine-prilocaine (EMLA) cream Apply to affected area once 30 g 3  . ondansetron (ZOFRAN) 8 MG tablet Take 1 tablet (8 mg total) by mouth 2 (two) times daily as needed. 60 tablet 3  . prochlorperazine (COMPAZINE) 10 MG tablet Take 1 tablet (10 mg total) by mouth every 6 (six) hours as needed (Nausea or vomiting). [place]  tablet 3  . triamterene-hydrochlorothiazide (MAXZIDE-25) 37.5-25 MG tablet Take 1 tablet by mouth daily. 90 tablet 3   No current facility-administered medications for this visit.     OBJECTIVE: Vitals:   04/04/17 1055  BP: 132/78  Pulse: 76  Resp: 20  Temp: 98.1 F (36.7 C)     Body mass index is 39.47 kg/m.    ECOG FS:0 - Asymptomatic  General: Well-developed, well-nourished, no acute distress. Eyes: Pink conjunctiva, anicteric sclera. Breasts: Patient  requested exam be deferred today. Lungs: Clear to auscultation bilaterally. Heart: Regular rate and rhythm. No rubs, murmurs, or gallops. Abdomen: Soft, nontender, nondistended. No organomegaly noted, normoactive bowel sounds. Musculoskeletal: No edema, cyanosis, or clubbing. Neuro: Alert, answering all questions appropriately. Cranial nerves grossly intact. Skin: No rashes or petechiae noted. Psych: Normal affect.   LAB RESULTS:  Lab Results  Component Value Date   NA 137 04/04/2017   K 3.4 (L) 04/04/2017   CL 103 04/04/2017   CO2 26 04/04/2017   GLUCOSE 131 (H) 04/04/2017   BUN 15 04/04/2017   CREATININE 0.91 04/04/2017   CALCIUM 9.3 04/04/2017   PROT 7.4 04/04/2017   ALBUMIN 3.3 (L) 04/04/2017   AST 18 04/04/2017   ALT 16 04/04/2017   ALKPHOS 86 04/04/2017   BILITOT 0.2 (L) 04/04/2017   GFRNONAA >60 04/04/2017   GFRAA >60 04/04/2017    Lab Results  Component Value Date   WBC 7.5 04/04/2017   NEUTROABS 5.7 04/04/2017   HGB 10.7 (L) 04/04/2017   HCT 31.8 (L) 04/04/2017   MCV 74.0 (L) 04/04/2017   PLT 354 04/04/2017     STUDIES: Nm Cardiac Muga Rest  Result Date: 03/12/2017 CLINICAL DATA:  RIGHT breast cancer, high risk chemotherapy, baseline EXAM: NUCLEAR MEDICINE CARDIAC BLOOD POOL IMAGING (MUGA) TECHNIQUE: Cardiac multi-gated acquisition was performed at rest following intravenous injection of Tc-59mlabeled red blood cells. RADIOPHARMACEUTICALS:  21.38 mCi Tc-99mertechnetate in-vitro labeled autologous red blood cells IV COMPARISON:  None. FINDINGS: Calculated LEFT ventricular ejection fraction is 59%, which is within the normal range. Patient was rhythmic during imaging. Cine analysis of the LEFT ventricle demonstrates normal LEFT ventricular wall motion. IMPRESSION: Normal LEFT ventricular ejection fraction of 59%. Normal LV wall motion. Electronically Signed   By: MaLavonia Dana.D.   On: 03/12/2017 16:16   Nm Pet Image Initial (pi) Skull Base To Thigh  Result  Date: 03/09/2017 CLINICAL DATA:  Initial treatment strategy for right breast cancer. EXAM: NUCLEAR MEDICINE PET SKULL BASE TO THIGH TECHNIQUE: 12.4 mCi F-18 FDG was injected intravenously. Full-ring PET imaging was performed from the skull base to thigh after the radiotracer. CT data was obtained and used for attenuation correction and anatomic localization. FASTING BLOOD GLUCOSE:  Value: 118 mg/dl COMPARISON:  Multiple exams, including CT abdomen from 05/02/2012 FINDINGS: NECK No hypermetabolic lymph nodes in the neck. Mild chronic left maxillary sinusitis. Low-level fairly symmetric activity in the palate and tonsils is highly likely to be physiologic. CHEST Hypermetabolic right axillary adenopathy. Index right axillary node 2.3 cm in short axis on image 67/3, maximum SUV 24.0. A separate right axillary/subpectoral lymph node adjacent to the axillary neurovascular structures measures 1.2 cm in short axis on image 51/ 3 and has a maximum SUV of 6.0, compatible with malignant involvement. There are other small subpectoral lymph nodes on the right which only have low-grade activity but merit surveillance. In the upper portion of the right breast there some faint bandlike opacity adjacent to a small clip, maximum  SUV in this vicinity 3.7, likely a postoperative region. No obvious worrisome lung nodules on the CT data. Upper normal size paratracheal and AP window lymph nodes do not demonstrate metabolic activity above the blood pool. ABDOMEN/PELVIS No abnormal hypermetabolic activity within the liver, pancreas, adrenal glands, or spleen. No hypermetabolic lymph nodes in the abdomen or pelvis. A 9 cm segment of the sigmoid colon demonstrates mildly accentuated metabolic activity, particularly proximally where the maximum SUV is 8.9. There is evidence of scattered diverticula and possibly some subtle inflammation along its proximal region, for example images 200-207 of series 3. This may reflect mild diverticulitis but  sigmoid colon tumor is not specifically excluded given this constellation of findings. Small retroperitoneal and pelvic lymph nodes are not pathologically enlarged or hypermetabolic. SKELETON No focal hypermetabolic activity to suggest skeletal metastasis. IMPRESSION: 1. Hypermetabolic right axillary/subpectoral adenopathy. Low-grade activity in the right upper breast likely at the postoperative site. Appearance compatible with metastatic spread to right axillary/subpectoral lymph nodes. 2. There is some sigmoid colon diverticulosis, with faint inflammatory findings adjacent to the sigmoid colon proximally, and with accentuated activity in the involved segment of the sigmoid colon. This is probably inflammatory activity. Strictly speaking, malignancy in the sigmoid colon could have a similar appearance. Following any therapy for potential mild diverticulitis, consider colonoscopy if not recently performed in order to further assess the sigmoid colon. 3.  Aortic Atherosclerosis (ICD10-I70.0). 4. Mild chronic left maxillary sinusitis. Electronically Signed   By: Van Clines M.D.   On: 03/09/2017 10:03   Dg Chest Port 1 View  Result Date: 03/15/2017 CLINICAL DATA:  Port-A-Cath insertion.  Right breast carcinoma EXAM: PORTABLE CHEST 1 VIEW COMPARISON:  PET-CT March 09, 2017 FINDINGS: Port-A-Cath tip is in the superior vena cava approximately 3 cm proximal to the cavoatrial junction. No pneumothorax. No edema or consolidation. Heart size and pulmonary vascularity are normal. No adenopathy. No bone lesions. IMPRESSION: Port-A-Cath as described without pneumothorax. No edema or consolidation. No evident adenopathy. Electronically Signed   By: Lowella Grip III M.D.   On: 03/15/2017 08:23   Dg C-arm 1-60 Min-no Report  Result Date: 03/15/2017 Fluoroscopy was utilized by the requesting physician.  No radiographic interpretation.    ASSESSMENT: Clinical stage IIA ER/PR positive, HER-2 negative invasive  carcinoma of the upper outer quadrant of the right breast.  PLAN:   1. 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 Pretreatment MUGA revealed an EF of 59%. PET scan results reviewed independently and reported as above with no obvious metastatic disease, but FDG positive area and her sigmoid colon. After the completion of her neoadjuvant chemotherapy, she will require definitive surgery and possibly adjuvant XRT. Finally, given the ER/PR status of her tumor she will benefit from either tamoxifen or an aromatase inhibitor for total 5 years. Proceed with cycle 2 of 4 of Adriamycin and Cytoxan today. Return to clinic in 2 weeks for consideration of cycle 2. 2. Genetic testing: Given patient's age of under 63, will order genetic testing at patient's next clinic visit. 3. Sigmoid colon: PET positivity noted, likely physiologic or possibly diverticulosis. Once patient completes her treatment for breast cancer, will refer for colonoscopy for further evaluation.  Patient expressed understanding and was in agreement with this plan. She also understands  that She can call clinic at any time with any questions, concerns, or complaints.   Cancer Staging Malignant neoplasm of upper-outer quadrant of right breast in female, estrogen receptor positive ([place]) Staging form: Breast, AJCC 8th Edition - Clinical stage from 03/05/2017: Stage IIA (cT2, cN2a, cM0, G2, ER: Positive, PR: Positive, HER2: Negative) - Signed by Lloyd Huger, MD on 03/19/2017   Lloyd Huger, MD   04/07/2017 6:58 AM

## 2017-04-04 ENCOUNTER — Ambulatory Visit (INDEPENDENT_AMBULATORY_CARE_PROVIDER_SITE_OTHER): Payer: Medicaid Other | Admitting: Nurse Practitioner

## 2017-04-04 ENCOUNTER — Inpatient Hospital Stay (HOSPITAL_BASED_OUTPATIENT_CLINIC_OR_DEPARTMENT_OTHER): Payer: Medicaid Other | Admitting: Oncology

## 2017-04-04 ENCOUNTER — Other Ambulatory Visit: Payer: Self-pay | Admitting: Nurse Practitioner

## 2017-04-04 ENCOUNTER — Inpatient Hospital Stay: Payer: Medicaid Other

## 2017-04-04 ENCOUNTER — Encounter: Payer: Self-pay | Admitting: Nurse Practitioner

## 2017-04-04 ENCOUNTER — Telehealth: Payer: Self-pay | Admitting: *Deleted

## 2017-04-04 ENCOUNTER — Other Ambulatory Visit: Payer: Self-pay | Admitting: *Deleted

## 2017-04-04 VITALS — BP 132/78 | HR 76 | Temp 98.1°F | Resp 20 | Wt 239.0 lb

## 2017-04-04 VITALS — BP 116/73 | HR 78 | Temp 98.5°F | Ht 65.25 in | Wt 238.6 lb

## 2017-04-04 DIAGNOSIS — C50411 Malignant neoplasm of upper-outer quadrant of right female breast: Secondary | ICD-10-CM

## 2017-04-04 DIAGNOSIS — Z170 Estrogen receptor positive status [ER+]: Principal | ICD-10-CM

## 2017-04-04 DIAGNOSIS — R5383 Other fatigue: Secondary | ICD-10-CM | POA: Diagnosis not present

## 2017-04-04 DIAGNOSIS — R590 Localized enlarged lymph nodes: Secondary | ICD-10-CM

## 2017-04-04 DIAGNOSIS — Z803 Family history of malignant neoplasm of breast: Secondary | ICD-10-CM

## 2017-04-04 DIAGNOSIS — Z5111 Encounter for antineoplastic chemotherapy: Secondary | ICD-10-CM | POA: Diagnosis not present

## 2017-04-04 DIAGNOSIS — M129 Arthropathy, unspecified: Secondary | ICD-10-CM

## 2017-04-04 DIAGNOSIS — I10 Essential (primary) hypertension: Secondary | ICD-10-CM

## 2017-04-04 DIAGNOSIS — Z800 Family history of malignant neoplasm of digestive organs: Secondary | ICD-10-CM

## 2017-04-04 DIAGNOSIS — Z7689 Persons encountering health services in other specified circumstances: Secondary | ICD-10-CM | POA: Diagnosis not present

## 2017-04-04 DIAGNOSIS — F419 Anxiety disorder, unspecified: Secondary | ICD-10-CM

## 2017-04-04 DIAGNOSIS — I700 Atherosclerosis of aorta: Secondary | ICD-10-CM

## 2017-04-04 DIAGNOSIS — R634 Abnormal weight loss: Secondary | ICD-10-CM | POA: Diagnosis not present

## 2017-04-04 DIAGNOSIS — J320 Chronic maxillary sinusitis: Secondary | ICD-10-CM

## 2017-04-04 DIAGNOSIS — E669 Obesity, unspecified: Secondary | ICD-10-CM

## 2017-04-04 DIAGNOSIS — F17210 Nicotine dependence, cigarettes, uncomplicated: Secondary | ICD-10-CM

## 2017-04-04 DIAGNOSIS — K5730 Diverticulosis of large intestine without perforation or abscess without bleeding: Secondary | ICD-10-CM

## 2017-04-04 DIAGNOSIS — I499 Cardiac arrhythmia, unspecified: Secondary | ICD-10-CM

## 2017-04-04 LAB — LIPID PANEL
Cholesterol: 138 mg/dL (ref 0–200)
HDL: 17 mg/dL — ABNORMAL LOW (ref >40)
LDL Cholesterol: 98 mg/dL (ref 0–99)
Total CHOL/HDL Ratio: 8.1 RATIO
Triglycerides: 117 mg/dL (ref <150)
VLDL: 23 mg/dL (ref 0–40)

## 2017-04-04 LAB — CBC WITH DIFFERENTIAL/PLATELET
BASOS ABS: 0 10*3/uL (ref 0–0.1)
BASOS PCT: 0 %
EOS ABS: 0.1 10*3/uL (ref 0–0.7)
EOS PCT: 1 %
HCT: 31.8 % — ABNORMAL LOW (ref 35.0–47.0)
Hemoglobin: 10.7 g/dL — ABNORMAL LOW (ref 12.0–16.0)
LYMPHS PCT: 16 %
Lymphs Abs: 1.2 10*3/uL (ref 1.0–3.6)
MCH: 24.8 pg — ABNORMAL LOW (ref 26.0–34.0)
MCHC: 33.5 g/dL (ref 32.0–36.0)
MCV: 74 fL — AB (ref 80.0–100.0)
MONO ABS: 0.5 10*3/uL (ref 0.2–0.9)
Monocytes Relative: 7 %
Neutro Abs: 5.7 10*3/uL (ref 1.4–6.5)
Neutrophils Relative %: 76 %
PLATELETS: 354 10*3/uL (ref 150–440)
RBC: 4.3 MIL/uL (ref 3.80–5.20)
RDW: 17.4 % — AB (ref 11.5–14.5)
WBC: 7.5 10*3/uL (ref 3.6–11.0)

## 2017-04-04 LAB — COMPREHENSIVE METABOLIC PANEL
ALT: 16 U/L (ref 14–54)
AST: 18 U/L (ref 15–41)
Albumin: 3.3 g/dL — ABNORMAL LOW (ref 3.5–5.0)
Alkaline Phosphatase: 86 U/L (ref 38–126)
Anion gap: 8 (ref 5–15)
BUN: 15 mg/dL (ref 6–20)
CHLORIDE: 103 mmol/L (ref 101–111)
CO2: 26 mmol/L (ref 22–32)
Calcium: 9.3 mg/dL (ref 8.9–10.3)
Creatinine, Ser: 0.91 mg/dL (ref 0.44–1.00)
GFR calc Af Amer: >60 mL/min (ref >60)
Glucose, Bld: 131 mg/dL — ABNORMAL HIGH (ref 65–99)
POTASSIUM: 3.4 mmol/L — AB (ref 3.5–5.1)
Sodium: 137 mmol/L (ref 135–145)
Total Bilirubin: 0.2 mg/dL — ABNORMAL LOW (ref 0.3–1.2)
Total Protein: 7.4 g/dL (ref 6.5–8.1)

## 2017-04-04 MED ORDER — DOXORUBICIN HCL CHEMO IV INJECTION 2 MG/ML
60.0000 mg/m2 | Freq: Once | INTRAVENOUS | Status: AC
  Administered 2017-04-04: 138 mg via INTRAVENOUS
  Filled 2017-04-04: qty 69

## 2017-04-04 MED ORDER — SODIUM CHLORIDE 0.9% FLUSH
10.0000 mL | Freq: Once | INTRAVENOUS | Status: AC
  Administered 2017-04-04: 10 mL via INTRAVENOUS
  Filled 2017-04-04: qty 10

## 2017-04-04 MED ORDER — PEGFILGRASTIM 6 MG/0.6ML ~~LOC~~ PSKT
6.0000 mg | PREFILLED_SYRINGE | Freq: Once | SUBCUTANEOUS | Status: AC
  Administered 2017-04-04: 6 mg via SUBCUTANEOUS
  Filled 2017-04-04: qty 0.6

## 2017-04-04 MED ORDER — SODIUM CHLORIDE 0.9 % IV SOLN
Freq: Once | INTRAVENOUS | Status: AC
  Administered 2017-04-04: 11:00:00 via INTRAVENOUS
  Filled 2017-04-04: qty 1000

## 2017-04-04 MED ORDER — PALONOSETRON HCL INJECTION 0.25 MG/5ML
0.2500 mg | Freq: Once | INTRAVENOUS | Status: AC
  Administered 2017-04-04: 0.25 mg via INTRAVENOUS
  Filled 2017-04-04: qty 5

## 2017-04-04 MED ORDER — TRIAMTERENE-HCTZ 37.5-25 MG PO TABS: 1.0000 | ORAL_TABLET | Freq: Every day | ORAL | 3 refills | Status: DC

## 2017-04-04 MED ORDER — SODIUM CHLORIDE 0.9 % IV SOLN
600.0000 mg/m2 | Freq: Once | INTRAVENOUS | Status: AC
  Administered 2017-04-04: 1380 mg via INTRAVENOUS
  Filled 2017-04-04: qty 50

## 2017-04-04 MED ORDER — HEPARIN SOD (PORK) LOCK FLUSH 100 UNIT/ML IV SOLN
500.0000 [IU] | Freq: Once | INTRAVENOUS | Status: AC
  Administered 2017-04-04: 500 [IU] via INTRAVENOUS
  Filled 2017-04-04: qty 5

## 2017-04-04 MED ORDER — SODIUM CHLORIDE 0.9 % IV SOLN
Freq: Once | INTRAVENOUS | Status: AC
  Administered 2017-04-04: 12:00:00 via INTRAVENOUS
  Filled 2017-04-04: qty 5

## 2017-04-04 NOTE — Progress Notes (Signed)
Patient here today for ongoing follow up and treatment consideration regarding breast cancer. Patient denies any concerns today.

## 2017-04-04 NOTE — Telephone Encounter (Signed)
Asking that we draw a Lipid panel on her when she comes in today and has labs for [place]

## 2017-04-04 NOTE — Progress Notes (Signed)
I have reviewed this encounter including the documentation in this note and/or discussed this patient with the provider, Cassell Smiles, AGPCNP-BC. I am certifying that I agree with the content of this note as supervising physician.  Nobie Putnam, [facility] 04/04/2017, 4:44 PM

## 2017-04-04 NOTE — Progress Notes (Signed)
Subjective:    Patient ID: Chelsea Hudson, female    DOB: [birth year], 51 y.o.   MRN: [id number]  Chelsea Hudson is a 51 y.o. female presenting on 04/04/2017 for Follow-up (hypertension med )   HPI Hypertension Pt states she has been feeling as good as possible.  She has not been taking all of her medications.  She ran out of amlodipine 5 mg once daily about 2 months ago.  She has only been taking triamterene-hydrochlorothiazide 37.5-25 mg at night.  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  Does get up "all night" to urinate and wishes she could take it during the day, but she can't.  Pt denies headache, lightheadedness, dizziness, changes in vision, chest tightness/pressure, palpitations, leg swelling, sudden loss of speech or loss of consiousness.  Talking to pastor - He recommends that it would be good to not eat greasy foods, so pt has been cutting back on fat.   She already eats a reduced salt diet and doesn't cook with salt.  Current Chemotherapy Treatment for Breast Ca - Fatigue and weight loss Doing well with Ca treatment so far and is in good spirits.  She has significant fatigue - r/t chemo.  For the first week after chemo, she is nauseated and drained/tired.  Still able to work, but falls asleep as soon as she gets home and rests.  Sometimes forgets her BP pills on those nights and sleeps all night.   She has lost 22 lbs since 03/15/2017 (3 weeks).  Pt doesn't notice that she has lost weight, but her friend that is accompanying her today noticed Sunday that her dress was too big.  She states she is able to eat despite her nausea.  Social History  Substance Use Topics  . Smoking status: Current Every Day Smoker    Packs/day: 0.25    Years: 25.00    Types: Cigarettes  . Smokeless tobacco: Never Used  . Alcohol use Yes     Comment: occas   Depression screen [facility] 2/9 04/04/2017 01/05/2016  Decreased Interest 0 0  Down, Depressed, Hopeless 0 0    PHQ - 2 Score 0 0  Altered sleeping 0 -  Tired, decreased energy 3 -  Change in appetite 0 -  Feeling bad or failure about yourself  0 -  Trouble concentrating 0 -  Moving slowly or fidgety/restless 0 -  Suicidal thoughts 0 -  PHQ-9 Score 3 -     Review of Systems Per HPI unless specifically indicated above     Objective:    BP 116/73   Pulse 78   Temp 98.5 F (36.9 C) (Oral)   Ht 5' 5.25" (1.657 m)   Wt 238 lb 9.6 oz (108.2 kg)   LMP 01/31/2014 (Approximate) Comment: LMP was 3 years ago.  BMI 39.40 kg/m   Wt Readings from Last 3 Encounters:  04/04/17 238 lb 9.6 oz (108.2 kg)  03/20/17 240 lb 14.4 oz (109.3 kg)  03/15/17 250 lb (113.4 kg)    Physical Exam  Constitutional: She is oriented to person, place, and time. She appears well-developed and well-nourished. No distress.  HENT:  Head: Normocephalic and atraumatic.  Mouth/Throat: Oropharynx is clear and moist.  Eyes: Conjunctivae are normal. Pupils are equal, round, and reactive to light. Right eye exhibits no discharge. Left eye exhibits no discharge. No scleral icterus.  Neck: Normal range of motion. Neck supple. No JVD present. No tracheal  deviation present.  Cardiovascular: Normal rate, regular rhythm, normal heart sounds and intact distal pulses.   No murmur heard. Trace sock-line edema BLE  Pulmonary/Chest: Effort normal and breath sounds normal. No respiratory distress.  Lymphadenopathy:    She has no cervical adenopathy.  Neurological: She is alert and oriented to person, place, and time.  Skin: Skin is warm and dry.  Psychiatric: She has a normal mood and affect. Her behavior is normal. Judgment and thought content normal.     Results for orders placed or performed in visit on 03/20/17  CBC with Differential  Result Value Ref Range   WBC 8.2 3.6 - 11.0 K/uL   RBC 5.16 3.80 - 5.20 MIL/uL   Hemoglobin 12.7 12.0 - 16.0 g/dL   HCT 38.3 35.0 - 47.0 %   MCV 74.3 (L) 80.0 - 100.0 fL   MCH 24.5 (L) 26.0  - 34.0 pg   MCHC 33.0 32.0 - 36.0 g/dL   RDW 18.1 (H) 11.5 - 14.5 %   Platelets 323 150 - 440 K/uL   Neutrophils Relative % 64 %   Neutro Abs 5.3 1.4 - 6.5 K/uL   Lymphocytes Relative 26 %   Lymphs Abs 2.1 1.0 - 3.6 K/uL   Monocytes Relative 6 %   Monocytes Absolute 0.5 0.2 - 0.9 K/uL   Eosinophils Relative 3 %   Eosinophils Absolute 0.2 0 - 0.7 K/uL   Basophils Relative 1 %   Basophils Absolute 0.0 0 - 0.1 K/uL  Comprehensive metabolic panel  Result Value Ref Range   Sodium 137 135 - 145 mmol/L   Potassium 3.9 3.5 - 5.1 mmol/L   Chloride 105 101 - 111 mmol/L   CO2 26 22 - 32 mmol/L   Glucose, Bld 134 (H) 65 - 99 mg/dL   BUN 24 (H) 6 - 20 mg/dL   Creatinine, Ser 0.84 0.44 - 1.00 mg/dL   Calcium 9.4 8.9 - 10.3 mg/dL   Total Protein 8.0 6.5 - 8.1 g/dL   Albumin 3.7 3.5 - 5.0 g/dL   AST 22 15 - 41 U/L   ALT 25 14 - 54 U/L   Alkaline Phosphatase 105 38 - 126 U/L   Total Bilirubin 0.5 0.3 - 1.2 mg/dL   GFR calc non Af Amer >60 >60 mL/min   GFR calc Af Amer >60 >60 mL/min   Anion gap 6 5 - 15  Glucose, random  Result Value Ref Range   Glucose, Bld 119 (H) 65 - 99 mg/dL  Thyroid Panel With TSH  Result Value Ref Range   TSH <0.006 (L) 0.450 - 4.500 uIU/mL   T4, Total 13.1 (H) 4.5 - 12.0 ug/dL   T3 Uptake Ratio 29 24 - 39 %   Free Thyroxine Index 3.8 1.2 - 4.9      Assessment & Plan:   Problem List Items Addressed This Visit      Cardiovascular and Mediastinum   Essential hypertension - Primary Stable and controlled.  Pt stopped taking amlodipine 2 months ago, but BP normal today after 20 lb weight loss.  Plan: 1. Continue triamterene-hydrochlorothiazide 37.5-25 mg once daily as early in day as possible to avoid nocturia. 2. Continue low salt diet.  Cutting out fats by recommendation of her pastor.  Consider adding more protein and vegetables r/t weight loss to increase caloric intake. 3. BP goals < 130/90 and >100/60 reviewed with pt.  If continued weight loss, may need  to reduce dose of medication. Pt verbalizes understanding.  4. Follow up 3 months or sooner if BP low. 5. Collect Lipid panel at next cancer center lab draw evaluate comorbid condition.  [place] for non-fasting.      Relevant Medications   triamterene-hydrochlorothiazide (MAXZIDE-25) 37.5-25 MG tablet Lipid panel    Other Visit Diagnoses    Fatigue due to treatment Significant fatigue after chemotherapy.  Improves within 1 week after chemotherapy.  Pt still able to work.  Plan: 1. Encouraged exercise (walking) at least 10 minutes daily to aid in fatigue.  Exercise 30 minutes most days of week if tolerated for heart health. 2. Take triamterene-hydrochlorothiazide 37.5-25 mg tablet as early in day as possible to aid in sleep.   Weight loss observed on examination     20 lb weight loss in 3 weeks.  Pt currently on chemotherapy for breast ca.  Nausea after chemo for first week, but does not prevent eating per patient report.  Plan: 1. Encouraged increasing protein intake to avoid protein malnutrition. 2. Reviewed good sources of protein and pt verbalized understanding.      Meds ordered this encounter  Medications  . triamterene-hydrochlorothiazide (MAXZIDE-25) 37.5-25 MG tablet    Sig: Take 1 tablet by mouth daily.    Dispense:  90 tablet    Refill:  3    Patient must schedule office visit no refills. Last office visit 02/2015.    Order Specific Question:   Supervising Provider    Answer:   Olin Hauser [[id number]]      Follow up plan: Return in about 3 months (around 07/05/2017) for blood pressure.   Cassell Smiles, DNP, AGPCNP-BC Adult Gerontology Primary Care Nurse Practitioner [facility] 04/04/2017, 8:28 AM

## 2017-04-04 NOTE — Patient Instructions (Addendum)
Chelsea Hudson, Thank you for coming in to clinic today.  1. Continue your triamterene-hydrochlorothiazide 37.5-25 mg once per day for your blood pressure.  Try to take this as early in the day as possible so you can sleep better.  - Continue eating a low fat (less greasy food) and low salt diet. - If you lose more weight, your blood pressure may drop more.  Keep your BP higher than 100/60.  Your treatment goal for taking medications is less than 130/90.  Continue taking your medications and let me know if your BP changes either higher or lower than these goals.  2. For your cancer treatment: - Continue with what Dr. Grayland Ormond instructs you to do. - Eat more protein.  This will help make sure your body doesn't use your muscles for energy.  You are losing weight, but we don't want you to lose muscle. - Walk and exercise at least 10 minutes per day. Do what you can.  This will help your overall energy levels.   Please schedule a follow-up appointment with Cassell Smiles, AGNP to Return in about 3 months (around 07/05/2017) for blood pressure.  If you have any other questions or concerns, please feel free to call the clinic or send a message through [place]. You may also schedule an earlier appointment if necessary.  Cassell Smiles, DNP, AGNP-BC Adult Gerontology Nurse Practitioner [facility], [facility]     DASH Eating Plan DASH stands for "Dietary Approaches to Stop Hypertension." The DASH eating plan is a healthy eating plan that has been shown to reduce high blood pressure (hypertension). It may also reduce your risk for type 2 diabetes, heart disease, and stroke. The DASH eating plan may also help with weight loss. What are tips for following this plan? General guidelines   Avoid eating more than 2,300 mg (milligrams) of salt (sodium) a day. If you have hypertension, you may need to reduce your sodium intake to 1,500 mg a day.  Limit alcohol intake to no more than 1 drink a  day for nonpregnant women and 2 drinks a day for men. One drink equals 12 oz of beer, 5 oz of wine, or 1 oz of hard liquor.  Work with your health care provider to maintain a healthy body weight or to lose weight. Ask what an ideal weight is for you.  Get at least 30 minutes of exercise that causes your heart to beat faster (aerobic exercise) most days of the week. Activities may include walking, swimming, or biking.  Work with your health care provider or diet and nutrition specialist (dietitian) to adjust your eating plan to your individual calorie needs. Reading food labels   Check food labels for the amount of sodium per serving. Choose foods with less than 5 percent of the Daily Value of sodium. Generally, foods with less than 300 mg of sodium per serving fit into this eating plan.  To find whole grains, look for the word "whole" as the first word in the ingredient list. Shopping   Buy products labeled as "low-sodium" or "no salt added."  Buy fresh foods. Avoid canned foods and premade or frozen meals. Cooking   Avoid adding salt when cooking. Use salt-free seasonings or herbs instead of table salt or sea salt. Check with your health care provider or pharmacist before using salt substitutes.  Do not fry foods. Cook foods using healthy methods such as baking, boiling, grilling, and broiling instead.  Cook with heart-healthy oils, such as olive,  canola, soybean, or sunflower oil. Meal planning    Eat a balanced diet that includes:  5 or more servings of fruits and vegetables each day. At each meal, try to fill half of your plate with fruits and vegetables.  Up to 6-8 servings of whole grains each day.  Less than 6 oz of lean meat, poultry, or fish each day. A 3-oz serving of meat is about the same size as a deck of cards. One egg equals 1 oz.  2 servings of low-fat dairy each day.  A serving of nuts, seeds, or beans 5 times each week.  Heart-healthy fats. Healthy fats  called Omega-3 fatty acids are found in foods such as flaxseeds and coldwater fish, like sardines, salmon, and mackerel.  Limit how much you eat of the following:  Canned or prepackaged foods.  Food that is high in trans fat, such as fried foods.  Food that is high in saturated fat, such as fatty meat.  Sweets, desserts, sugary drinks, and other foods with added sugar.  Full-fat dairy products.  Do not salt foods before eating.  Try to eat at least 2 vegetarian meals each week.  Eat more home-cooked food and less restaurant, buffet, and fast food.  When eating at a restaurant, ask that your food be prepared with less salt or no salt, if possible. What foods are recommended? The items listed may not be a complete list. Talk with your dietitian about what dietary choices are best for you. Grains  Whole-grain or whole-wheat bread. Whole-grain or whole-wheat pasta. Brown rice. Modena Morrow. Bulgur. Whole-grain and low-sodium cereals. Pita bread. Low-fat, low-sodium crackers. Whole-wheat flour tortillas. Vegetables  Fresh or frozen vegetables (raw, steamed, roasted, or grilled). Low-sodium or reduced-sodium tomato and vegetable juice. Low-sodium or reduced-sodium tomato sauce and tomato paste. Low-sodium or reduced-sodium canned vegetables. Fruits  All fresh, dried, or frozen fruit. Canned fruit in natural juice (without added sugar). Meat and other protein foods  Skinless chicken or [place]. Ground chicken or [place]. Pork with fat trimmed off. Fish and seafood. Egg whites. Dried beans, peas, or lentils. Unsalted nuts, nut butters, and seeds. Unsalted canned beans. Lean cuts of beef with fat trimmed off. Low-sodium, lean deli meat. Dairy  Low-fat (1%) or fat-free (skim) milk. Fat-free, low-fat, or reduced-fat cheeses. Nonfat, low-sodium ricotta or cottage cheese. Low-fat or nonfat yogurt. Low-fat, low-sodium cheese. Fats and oils  Soft margarine without trans fats. Vegetable oil.  Low-fat, reduced-fat, or light mayonnaise and salad dressings (reduced-sodium). Canola, safflower, olive, soybean, and sunflower oils. Avocado. Seasoning and other foods  Herbs. Spices. Seasoning mixes without salt. Unsalted popcorn and pretzels. Fat-free sweets. What foods are not recommended? The items listed may not be a complete list. Talk with your dietitian about what dietary choices are best for you. Grains  Baked goods made with fat, such as croissants, muffins, or some breads. Dry pasta or rice meal packs. Vegetables  Creamed or fried vegetables. Vegetables in a cheese sauce. Regular canned vegetables (not low-sodium or reduced-sodium). Regular canned tomato sauce and paste (not low-sodium or reduced-sodium). Regular tomato and vegetable juice (not low-sodium or reduced-sodium). Angie Fava. Olives. Fruits  Canned fruit in a light or heavy syrup. Fried fruit. Fruit in cream or butter sauce. Meat and other protein foods  Fatty cuts of meat. Ribs. Fried meat. Berniece Salines. Sausage. Bologna and other processed lunch meats. Salami. Fatback. Hotdogs. Bratwurst. Salted nuts and seeds. Canned beans with added salt. Canned or smoked fish. Whole eggs or egg yolks. Chicken  or [place] with skin. Dairy  Whole or 2% milk, cream, and half-and-half. Whole or full-fat cream cheese. Whole-fat or sweetened yogurt. Full-fat cheese. Nondairy creamers. Whipped toppings. Processed cheese and cheese spreads. Fats and oils  Butter. Stick margarine. Lard. Shortening. Ghee. Bacon fat. Tropical oils, such as coconut, palm kernel, or palm oil. Seasoning and other foods  Salted popcorn and pretzels. Onion salt, garlic salt, seasoned salt, table salt, and sea salt. Worcestershire sauce. Tartar sauce. Barbecue sauce. Teriyaki sauce. Soy sauce, including reduced-sodium. Steak sauce. Canned and packaged gravies. Fish sauce. Oyster sauce. Cocktail sauce. Horseradish that you find on the shelf. Ketchup. Mustard. Meat flavorings and  tenderizers. Bouillon cubes. Hot sauce and Tabasco sauce. Premade or packaged marinades. Premade or packaged taco seasonings. Relishes. Regular salad dressings. Where to find more information:  National Heart, Lung, and [place]: https://wilson-eaton.com/  American Heart Association: www.heart.org Summary  The DASH eating plan is a healthy eating plan that has been shown to reduce high blood pressure (hypertension). It may also reduce your risk for type 2 diabetes, heart disease, and stroke.  With the DASH eating plan, you should limit salt (sodium) intake to 2,300 mg a day. If you have hypertension, you may need to reduce your sodium intake to 1,500 mg a day.  When on the DASH eating plan, aim to eat more fresh fruits and vegetables, whole grains, lean proteins, low-fat dairy, and heart-healthy fats.  Work with your health care provider or diet and nutrition specialist (dietitian) to adjust your eating plan to your individual calorie needs. This information is not intended to replace advice given to you by your health care provider. Make sure you discuss any questions you have with your health care provider. Document Released: 10/26/2011 Document Revised: 10/30/2016 Document Reviewed: 10/30/2016 Elsevier Interactive Patient Education  2017 [employer].

## 2017-04-04 NOTE — Telephone Encounter (Signed)
It is in the computer

## 2017-04-04 NOTE — Telephone Encounter (Signed)
Will need an order from another provider.

## 2017-04-17 NOTE — Progress Notes (Signed)
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  INTERVAL HISTORY: Patient returns to clinic today for further evaluation and consideration of cycle 3 of 4 of Adriamycin and Cytoxan. She currently has a cough and increased congestion without fever.  She started Levaquin yesterday. She otherwise feels well. She has no neurologic complaints. She has a good appetite and denies weight loss. She has no chest pain or shortness of breath. She denies any nausea, vomiting, constipation, or diarrhea. She has no urinary complaints. Patient offers no further specific complaints.  REVIEW OF SYSTEMS:   Review of Systems  Constitutional: Negative.  Negative for fever, malaise/fatigue and weight loss.  HENT: Positive for congestion.   Respiratory: Positive for cough. Negative for shortness of breath.   Cardiovascular: Negative.  Negative for chest pain and leg swelling.  Gastrointestinal: Negative.  Negative for abdominal pain.  Genitourinary: Negative.   Musculoskeletal: Negative.   Skin: Negative.  Negative for rash.  Neurological: Negative.  Negative for weakness.  Psychiatric/Behavioral: The patient is nervous/anxious.     As per HPI. Otherwise, a complete review of systems is negative.  PAST MEDICAL HISTORY: Past Medical History:  Diagnosis Date  . Arthritis    SHOULDER  . Cancer ([place]) 02/28/2017   INVASIVE MAMMARY CARCINOMA WITH MUCINOUS FEATURES.  . Hypertension   . Irregular heart beat    PT STATES IT "SKIPS A BEAT"   . Obesity     PAST SURGICAL  HISTORY: Past Surgical History:  Procedure Laterality Date  . ANKLE SURGERY    . BREAST BIOPSY Right 02/28/2017   INVASIVE MAMMARY CARCINOMA WITH MUCINOUS FEATURES.  Marland Kitchen BREAST CYST ASPIRATION Right    NEG  . PORTACATH PLACEMENT Left 03/15/2017   Procedure: INSERTION PORT-A-CATH;  Surgeon: Robert Bellow, MD;  Location: [facility] ORS;  Service: General;  Laterality: Left;    FAMILY HISTORY: Family History  Problem Relation Age of Onset  . Diabetes Father   . Stroke Father   . Breast cancer Mother 37  . Brain cancer Maternal Aunt 60  . Colon cancer Neg Hx     ADVANCED DIRECTIVES (Y/N):  N  HEALTH MAINTENANCE: Social History  Substance Use Topics  . Smoking status: Current Every Day Smoker    Packs/day: 0.25    Years: 25.00    Types: Cigarettes  . Smokeless tobacco: Never Used  . Alcohol use Yes     Comment: occas     Colonoscopy:  PAP:  Bone density:  Lipid panel:  Allergies  Allergen Reactions  . Other Hives and Itching    [name]     Current Outpatient Prescriptions  Medication Sig Dispense Refill  . levofloxacin (LEVAQUIN) 500 MG tablet Take 500 mg by mouth daily.    Marland Kitchen lidocaine-prilocaine (EMLA) cream Apply to affected area once 30 g 3  . ondansetron (ZOFRAN) 8 MG tablet Take 1 tablet (8 mg total) by mouth 2 (two) times daily as needed. 60 tablet 3  . prochlorperazine (COMPAZINE) 10 MG tablet  Take 1 tablet (10 mg total) by mouth every 6 (six) hours as needed (Nausea or vomiting). 60 tablet 3  . triamterene-hydrochlorothiazide (MAXZIDE-25) 37.5-25 MG tablet Take 1 tablet by mouth daily. 90 tablet 3   No current facility-administered medications for this visit.     OBJECTIVE: Vitals:   04/18/17 1107  BP: 119/88  Pulse: 87  Resp: 20  Temp: 98.1 F (36.7 C)     Body mass index is 39.73 kg/m.    ECOG FS:0 - Asymptomatic  General: Well-developed,  well-nourished, no acute distress. Eyes: Pink conjunctiva, anicteric sclera. Breasts: Patient requested exam be deferred today. Lungs: Clear to auscultation bilaterally. Heart: Regular rate and rhythm. No rubs, murmurs, or gallops. Abdomen: Soft, nontender, nondistended. No organomegaly noted, normoactive bowel sounds. Musculoskeletal: No edema, cyanosis, or clubbing. Neuro: Alert, answering all questions appropriately. Cranial nerves grossly intact. Skin: No rashes or petechiae noted. Psych: Normal affect.   LAB RESULTS:  Lab Results  Component Value Date   NA 137 04/18/2017   K 3.2 (L) 04/18/2017   CL 104 04/18/2017   CO2 27 04/18/2017   GLUCOSE 133 (H) 04/18/2017   BUN 11 04/18/2017   CREATININE 0.91 04/18/2017   CALCIUM 8.9 04/18/2017   PROT 6.9 04/18/2017   ALBUMIN 2.9 (L) 04/18/2017   AST 18 04/18/2017   ALT 16 04/18/2017   ALKPHOS 89 04/18/2017   BILITOT 0.3 04/18/2017   GFRNONAA >60 04/18/2017   GFRAA >60 04/18/2017    Lab Results  Component Value Date   WBC 7.3 04/18/2017   NEUTROABS 6.0 04/18/2017   HGB 9.2 (L) 04/18/2017   HCT 27.8 (L) 04/18/2017   MCV 74.3 (L) 04/18/2017   PLT 261 04/18/2017     STUDIES: No results found.  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  PLAN:   1. 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 Pretreatment MUGA revealed an EF of 59%. PET scan results reviewed independently with no obvious metastatic disease, but FDG positive area and her sigmoid colon. After the completion of her neoadjuvant chemotherapy, she will require definitive surgery and possibly adjuvant XRT.  Finally, given the ER/PR status of her tumor she will benefit from either tamoxifen or an aromatase inhibitor for total 5 years. Proceed with cycle 3 of 4 of Adriamycin and Cytoxan today. Return to clinic in 2 weeks for consideration of cycle 4. 2. Genetic testing: Given patient's age of under 54, will order genetic testing. 3. Sigmoid colon: PET positivity noted, likely physiologic or possibly diverticulosis. Once patient completes her treatment for breast cancer, will refer for colonoscopy for further evaluation. 4. Cough/congestion: Continue Levaquin as prescribed.  Patient expressed understanding and was in agreement with this plan. She also understands that She can call clinic at any time with any questions, concerns, or complaints.   Cancer Staging Malignant neoplasm of upper-outer quadrant of right breast in female, estrogen receptor positive ([place]) Staging form: Breast, AJCC 8th Edition - Clinical stage from 03/05/2017: Stage IIA (cT2, cN2a, cM0, G2, ER: Positive, PR: Positive, HER2: Negative) - Signed by Lloyd Huger, MD on 03/19/2017   Lloyd Huger, MD   04/21/2017 9:47 PM

## 2017-04-18 ENCOUNTER — Inpatient Hospital Stay (HOSPITAL_BASED_OUTPATIENT_CLINIC_OR_DEPARTMENT_OTHER): Payer: Medicaid Other | Admitting: Oncology

## 2017-04-18 ENCOUNTER — Inpatient Hospital Stay: Payer: Medicaid Other

## 2017-04-18 VITALS — BP 119/88 | HR 87 | Temp 98.1°F | Resp 20 | Wt 240.6 lb

## 2017-04-18 DIAGNOSIS — C50411 Malignant neoplasm of upper-outer quadrant of right female breast: Secondary | ICD-10-CM

## 2017-04-18 DIAGNOSIS — Z7689 Persons encountering health services in other specified circumstances: Secondary | ICD-10-CM | POA: Diagnosis not present

## 2017-04-18 DIAGNOSIS — Z170 Estrogen receptor positive status [ER+]: Principal | ICD-10-CM

## 2017-04-18 DIAGNOSIS — F419 Anxiety disorder, unspecified: Secondary | ICD-10-CM

## 2017-04-18 DIAGNOSIS — K5730 Diverticulosis of large intestine without perforation or abscess without bleeding: Secondary | ICD-10-CM

## 2017-04-18 DIAGNOSIS — I10 Essential (primary) hypertension: Secondary | ICD-10-CM

## 2017-04-18 DIAGNOSIS — E669 Obesity, unspecified: Secondary | ICD-10-CM

## 2017-04-18 DIAGNOSIS — R933 Abnormal findings on diagnostic imaging of other parts of digestive tract: Secondary | ICD-10-CM

## 2017-04-18 DIAGNOSIS — I700 Atherosclerosis of aorta: Secondary | ICD-10-CM

## 2017-04-18 DIAGNOSIS — R590 Localized enlarged lymph nodes: Secondary | ICD-10-CM

## 2017-04-18 DIAGNOSIS — F17210 Nicotine dependence, cigarettes, uncomplicated: Secondary | ICD-10-CM

## 2017-04-18 DIAGNOSIS — M129 Arthropathy, unspecified: Secondary | ICD-10-CM

## 2017-04-18 DIAGNOSIS — Z803 Family history of malignant neoplasm of breast: Secondary | ICD-10-CM

## 2017-04-18 DIAGNOSIS — J320 Chronic maxillary sinusitis: Secondary | ICD-10-CM

## 2017-04-18 DIAGNOSIS — Z5111 Encounter for antineoplastic chemotherapy: Secondary | ICD-10-CM | POA: Diagnosis not present

## 2017-04-18 DIAGNOSIS — I499 Cardiac arrhythmia, unspecified: Secondary | ICD-10-CM

## 2017-04-18 DIAGNOSIS — R05 Cough: Secondary | ICD-10-CM

## 2017-04-18 DIAGNOSIS — Z800 Family history of malignant neoplasm of digestive organs: Secondary | ICD-10-CM

## 2017-04-18 LAB — CBC WITH DIFFERENTIAL/PLATELET
BASOS ABS: 0 10*3/uL (ref 0–0.1)
BASOS PCT: 0 %
Eosinophils Absolute: 0.1 10*3/uL (ref 0–0.7)
Eosinophils Relative: 1 %
HEMATOCRIT: 27.8 % — AB (ref 35.0–47.0)
HEMOGLOBIN: 9.2 g/dL — AB (ref 12.0–16.0)
LYMPHS PCT: 11 %
Lymphs Abs: 0.8 10*3/uL — ABNORMAL LOW (ref 1.0–3.6)
MCH: 24.6 pg — ABNORMAL LOW (ref 26.0–34.0)
MCHC: 33.2 g/dL (ref 32.0–36.0)
MCV: 74.3 fL — AB (ref 80.0–100.0)
MONOS PCT: 7 %
Monocytes Absolute: 0.5 10*3/uL (ref 0.2–0.9)
NEUTROS ABS: 6 10*3/uL (ref 1.4–6.5)
NEUTROS PCT: 81 %
Platelets: 261 10*3/uL (ref 150–440)
RBC: 3.75 MIL/uL — ABNORMAL LOW (ref 3.80–5.20)
RDW: 17.3 % — ABNORMAL HIGH (ref 11.5–14.5)
WBC: 7.3 10*3/uL (ref 3.6–11.0)

## 2017-04-18 LAB — COMPREHENSIVE METABOLIC PANEL
ALBUMIN: 2.9 g/dL — AB (ref 3.5–5.0)
ALK PHOS: 89 U/L (ref 38–126)
ALT: 16 U/L (ref 14–54)
AST: 18 U/L (ref 15–41)
Anion gap: 6 (ref 5–15)
BILIRUBIN TOTAL: 0.3 mg/dL (ref 0.3–1.2)
BUN: 11 mg/dL (ref 6–20)
CALCIUM: 8.9 mg/dL (ref 8.9–10.3)
CO2: 27 mmol/L (ref 22–32)
CREATININE: 0.91 mg/dL (ref 0.44–1.00)
Chloride: 104 mmol/L (ref 101–111)
GFR calc Af Amer: >60 mL/min (ref >60)
GFR calc non Af Amer: >60 mL/min (ref >60)
GLUCOSE: 133 mg/dL — AB (ref 65–99)
Potassium: 3.2 mmol/L — ABNORMAL LOW (ref 3.5–5.1)
Sodium: 137 mmol/L (ref 135–145)
TOTAL PROTEIN: 6.9 g/dL (ref 6.5–8.1)

## 2017-04-18 MED ORDER — HEPARIN SOD (PORK) LOCK FLUSH 100 UNIT/ML IV SOLN
500.0000 [IU] | Freq: Once | INTRAVENOUS | Status: AC | PRN
  Administered 2017-04-18: 500 [IU]
  Filled 2017-04-18 (×2): qty 5

## 2017-04-18 MED ORDER — PEGFILGRASTIM 6 MG/0.6ML ~~LOC~~ PSKT
6.0000 mg | PREFILLED_SYRINGE | Freq: Once | SUBCUTANEOUS | Status: AC
  Administered 2017-04-18: 6 mg via SUBCUTANEOUS
  Filled 2017-04-18: qty 0.6

## 2017-04-18 MED ORDER — DOXORUBICIN HCL CHEMO IV INJECTION 2 MG/ML
60.0000 mg/m2 | Freq: Once | INTRAVENOUS | Status: AC
  Administered 2017-04-18: 138 mg via INTRAVENOUS
  Filled 2017-04-18: qty 69

## 2017-04-18 MED ORDER — PALONOSETRON HCL INJECTION 0.25 MG/5ML
0.2500 mg | Freq: Once | INTRAVENOUS | Status: AC
  Administered 2017-04-18: 0.25 mg via INTRAVENOUS
  Filled 2017-04-18: qty 5

## 2017-04-18 MED ORDER — SODIUM CHLORIDE 0.9 % IV SOLN
600.0000 mg/m2 | Freq: Once | INTRAVENOUS | Status: AC
  Administered 2017-04-18: 1380 mg via INTRAVENOUS
  Filled 2017-04-18: qty 50

## 2017-04-18 MED ORDER — SODIUM CHLORIDE 0.9 % IV SOLN
Freq: Once | INTRAVENOUS | Status: AC
  Administered 2017-04-18: 12:00:00 via INTRAVENOUS
  Filled 2017-04-18: qty 5

## 2017-04-18 MED ORDER — SODIUM CHLORIDE 0.9 % IV SOLN
Freq: Once | INTRAVENOUS | Status: AC
  Administered 2017-04-18: 12:00:00 via INTRAVENOUS
  Filled 2017-04-18: qty 1000

## 2017-04-18 NOTE — Progress Notes (Signed)
Patient reports cough x 2 weeks, on Levaquin.

## 2017-04-20 IMAGING — US US BREAST*R* LIMITED INC AXILLA
1 series · 13 of 25 positions shown · non-contrast
Comparison: 12/13/2010

CLINICAL DATA: 50-year-old patient presents for evaluation of a
palpable right breast mass with overlying skin retraction. Her
mother was diagnosed with breast cancer at age 55.

EXAM:
2D DIGITAL DIAGNOSTIC BILATERAL MAMMOGRAM WITH CAD AND ADJUNCT TOMO
ULTRASOUND RIGHT BREAST

[Series 1: us breast*right* limited inc axilla · 0.07mm/px · 13 of 29 slices shown]
[im 1/29]
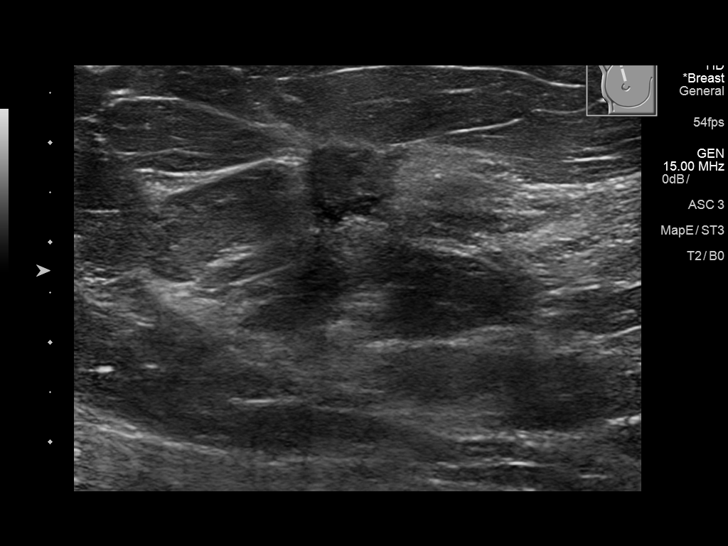
[im 3/29]
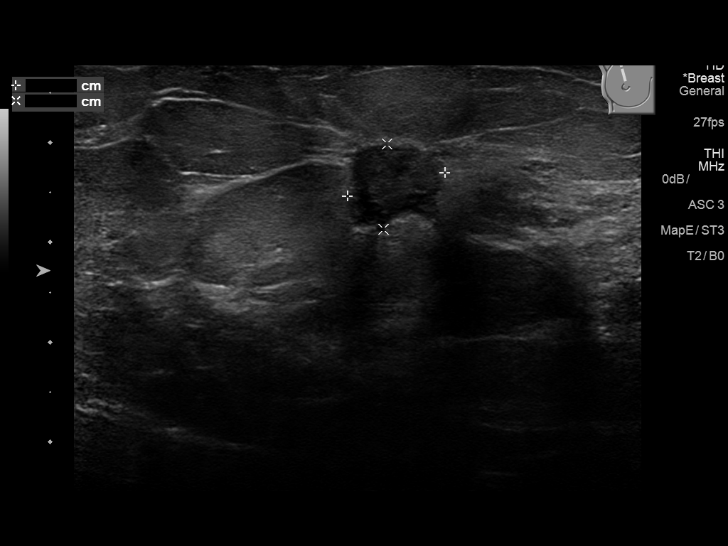
[im 5/29]
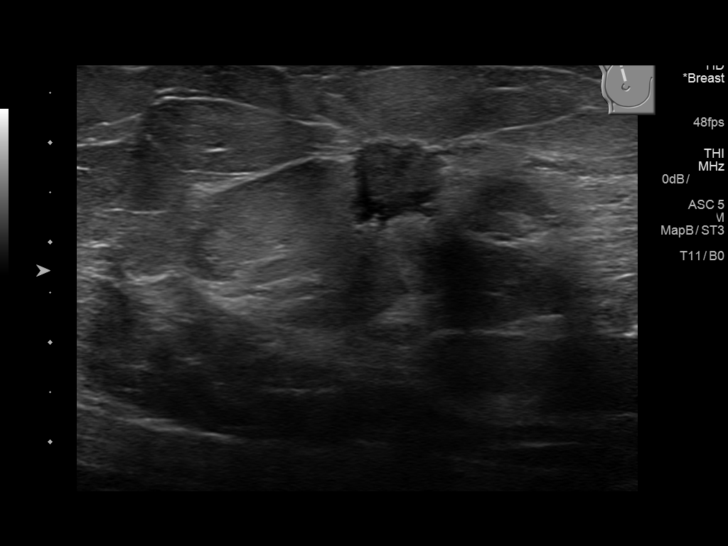
[im 8/29]
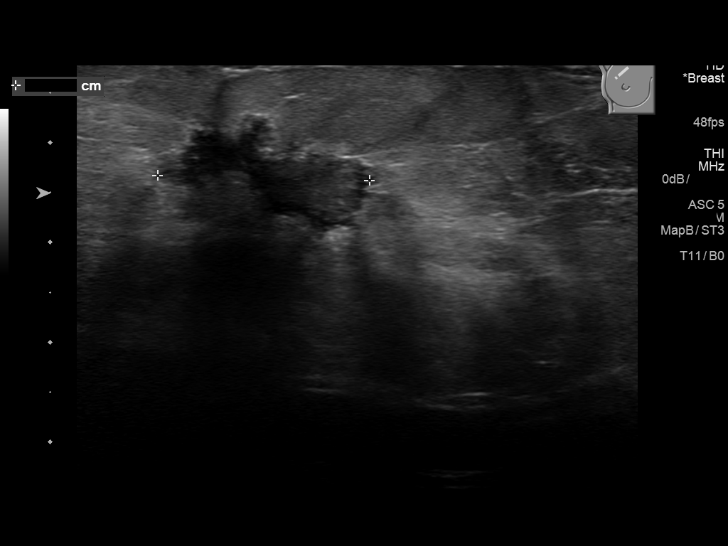
[im 10/29]
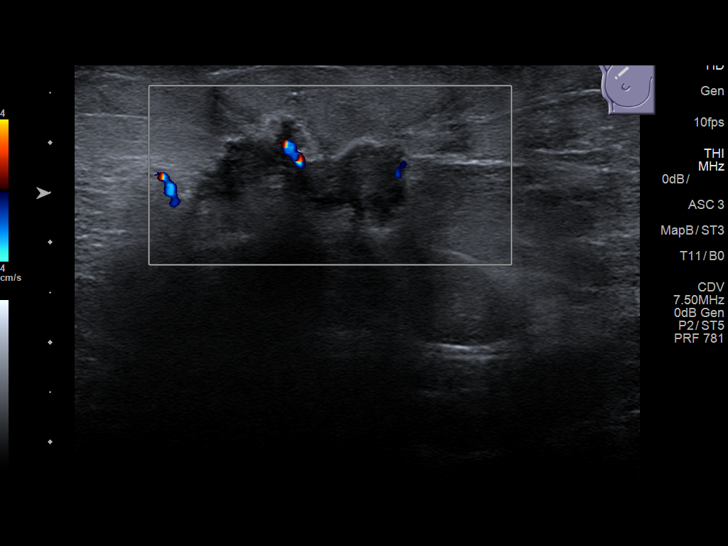
[im 12/29]
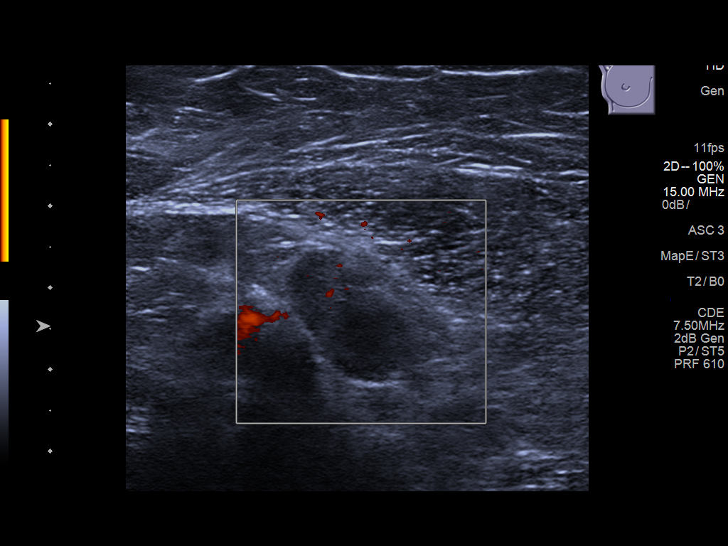
[im 15/29]
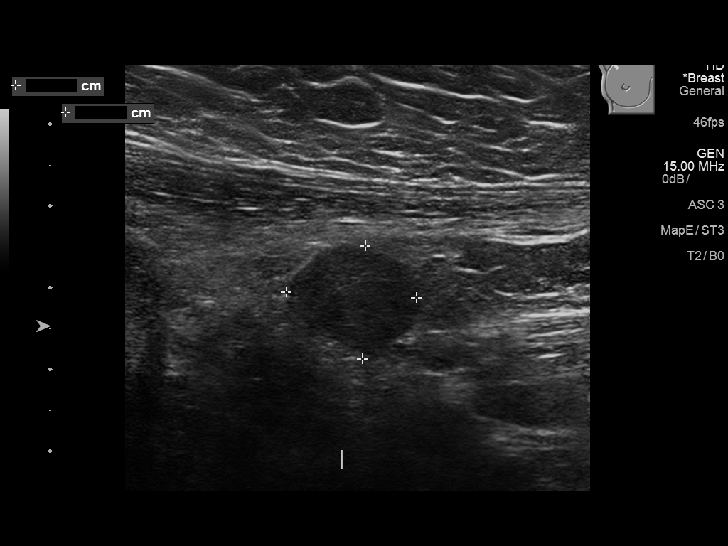
[im 17/29]
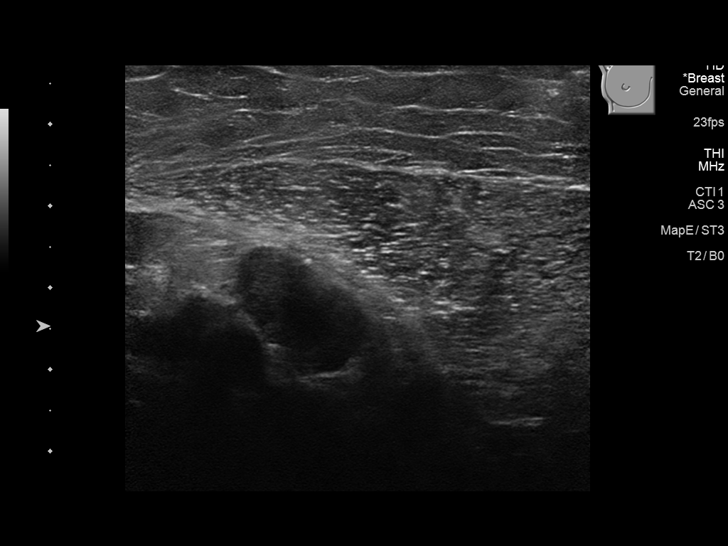
[im 19/29]
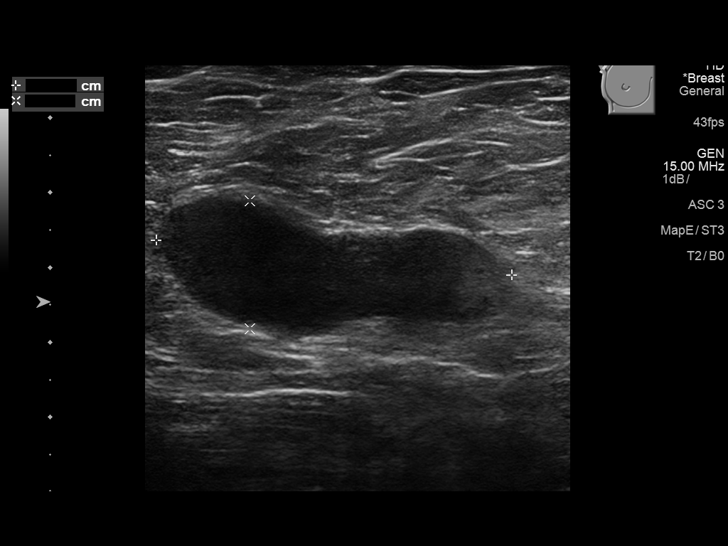
[im 22/29]
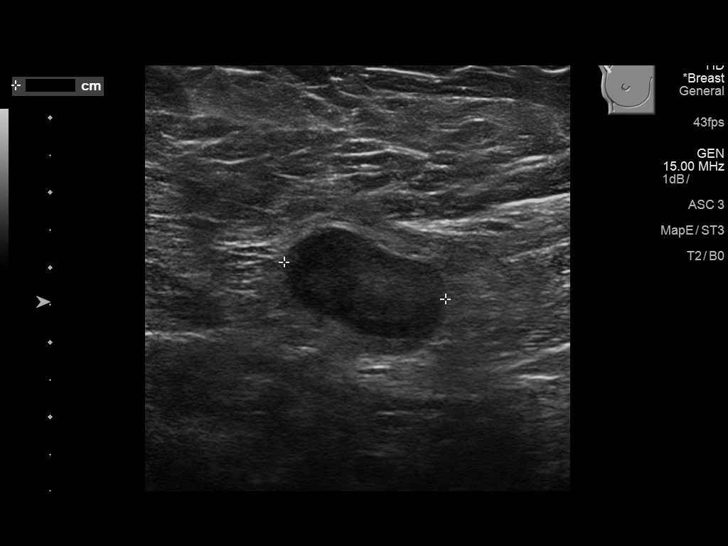
[im 24/29]
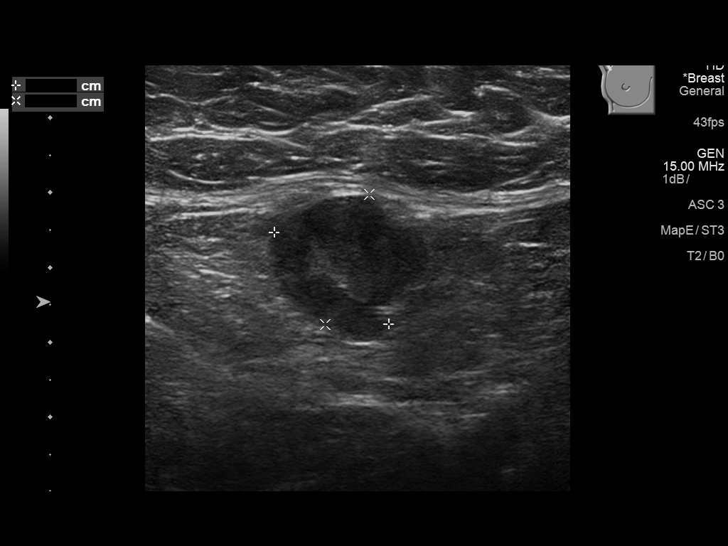
[im 26/29]
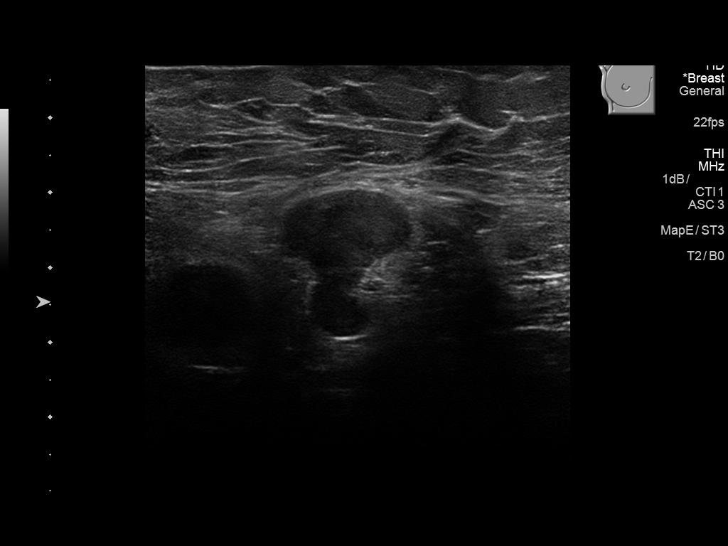
[im 29/29]
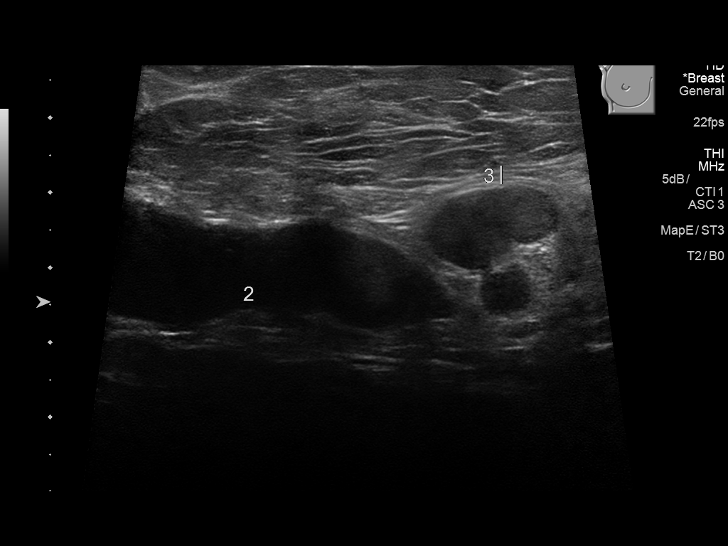

[13 of 25 positions shown; findings below may reference images not displayed]

ACR Breast Density Category b: There are scattered areas of
fibroglandular density.
FINDINGS: Metallic skin marker was placed over the palpable area of concern in
the upper-outer quadrant of the right breast. Underlying the
metallic skin marker is a spiculated mass with a few associated
microcalcifications. Right axillary adenopathy is visible in the MLO
projection of the mammogram.

No mass, distortion, or suspicious microcalcification is identified
in the left breast.

Mammographic images were processed with CAD.

On physical exam, there is a palpable lump in the 11 o'clock
position of the right breast 8 cm from the nipple with slight
overlying skin retraction. No skin thickening is appreciated.

Targeted ultrasound is performed, showing a markedly irregular
hypoechoic mass measuring 2.1 x 1.0 x 0.9 cm, with a feeding vessel.
The mass is at 11 o'clock position 8 cm from the nipple. The
overlying skin thickness is normal.

Ultrasound of the right axilla demonstrates at least 3 bulky,
enlarged and hypoechoic right axillary lymph nodes. Two of the
enlarged lymph nodes are deep to the pectoralis muscles, suggesting
level II lymphadenopathy. One of these lymph nodes measures 1.6 x
1.4 x 1.9 cm and the other measures 2.0 x 1.7 x 1.8 cm. The largest
axillary lymph node is 4.8 x 1.7 x 2.2 cm, and appears to be a level
1 axillary lymph node.
IMPRESSION: Suspicious palpable upper-outer quadrant right breast mass.

Level 1 and level II right axillary bulky adenopathy.

RECOMMENDATION:
Ultrasound-guided biopsy of the right breast mass and 1 of the right
axillary lymph nodes is recommended.

I have discussed the findings and recommendations with the patient.
Results were also provided in writing at the conclusion of the
visit. If applicable, a reminder letter will be sent to the patient
regarding the next appointment.

BI-RADS CATEGORY  5: Highly suggestive of malignancy.

## 2017-04-26 ENCOUNTER — Telehealth: Payer: Self-pay | Admitting: *Deleted

## 2017-04-26 DIAGNOSIS — K1379 Other lesions of oral mucosa: Secondary | ICD-10-CM

## 2017-04-26 MED ORDER — MAGIC MOUTHWASH W/LIDOCAINE: 5.0000 mL | Freq: Four times a day (QID) | ORAL | 3 refills | Status: DC | PRN

## 2017-04-26 NOTE — Telephone Encounter (Signed)
Called to report that she has mouth sores and is having difficulty eating and drinking. Please advise

## 2017-04-26 NOTE — Telephone Encounter (Signed)
MMW per VO Dr Grayland Ormond. Patient informed and Rx faxed to [facility] per patient request

## 2017-04-30 ENCOUNTER — Inpatient Hospital Stay: Payer: Medicaid Other | Attending: Oncology

## 2017-04-30 DIAGNOSIS — I499 Cardiac arrhythmia, unspecified: Secondary | ICD-10-CM | POA: Insufficient documentation

## 2017-04-30 DIAGNOSIS — E669 Obesity, unspecified: Secondary | ICD-10-CM | POA: Insufficient documentation

## 2017-04-30 DIAGNOSIS — C50411 Malignant neoplasm of upper-outer quadrant of right female breast: Secondary | ICD-10-CM | POA: Insufficient documentation

## 2017-04-30 DIAGNOSIS — Z803 Family history of malignant neoplasm of breast: Secondary | ICD-10-CM | POA: Insufficient documentation

## 2017-04-30 DIAGNOSIS — Z7689 Persons encountering health services in other specified circumstances: Secondary | ICD-10-CM | POA: Insufficient documentation

## 2017-04-30 DIAGNOSIS — M129 Arthropathy, unspecified: Secondary | ICD-10-CM | POA: Insufficient documentation

## 2017-04-30 DIAGNOSIS — I10 Essential (primary) hypertension: Secondary | ICD-10-CM | POA: Insufficient documentation

## 2017-04-30 DIAGNOSIS — F17210 Nicotine dependence, cigarettes, uncomplicated: Secondary | ICD-10-CM | POA: Insufficient documentation

## 2017-04-30 DIAGNOSIS — Z5111 Encounter for antineoplastic chemotherapy: Secondary | ICD-10-CM | POA: Insufficient documentation

## 2017-04-30 DIAGNOSIS — Z79899 Other long term (current) drug therapy: Secondary | ICD-10-CM | POA: Insufficient documentation

## 2017-04-30 DIAGNOSIS — Z170 Estrogen receptor positive status [ER+]: Secondary | ICD-10-CM | POA: Insufficient documentation

## 2017-04-30 DIAGNOSIS — D649 Anemia, unspecified: Secondary | ICD-10-CM | POA: Insufficient documentation

## 2017-04-30 NOTE — Progress Notes (Signed)
Nutrition Assessment   Reason for Assessment:   Patient with mouth sores, decreased intake and weight loss  ASSESSMENT:  51 year old female with right breast cancer. Patient currently receiving neoadjuvant chemotherapy.  Past medical history of HTN.  Noted PET positivity noted in sigmoid colon area per MD note likely physiologic or possibly diverticulosis.   Patient comes to clinic with mother.  Patient reports for the past week she has had sores in her mouth and mouth pain.  Reports that she was prescribed magic mouthwash and it has improved.  Reports that she ate eggs and bacon yesterday am, snacked on cashews during the day and ate ribs and broccoli for dinner.  Reports that for the past week has eaten about 50% less of normal intake due to mouth pain.   Reports some constipation, still has bowel movement every day but has to sit forever before anything comes out.  Also reports pain at times in left lower quadrant often accompanied with gas or burping and then it improves. Reported nausea for about a week following chemotherapy treatment, did take nausea medication with some improvement  Nutrition Focused Physical Exam: deferred  Medications: magic mouthwash, zofran, compazine  Labs: K 3.2, Na 133 (5/30)  Anthropometrics:   Height: 65.25 inches Weight: 240 lb 9.6 oz UBW: 240 lb. Noted in April 250 lb BMI: 39  4% weight loss in 1 month    NUTRITION DIAGNOSIS: Inadequate oral intake related to cancer related treatments (mouth sores) as evidenced by decreased intake and 4% weight loss   MALNUTRITION DIAGNOSIS: none at this time   INTERVENTION:   Discussed strategies to improve nutrition with sore mouth.  Provided fact sheet on Sore Mouth.   Discussed oral nutrition supplements as options and provided sample of carnation instant breakfast.  Encouraged patient to discuss left lower quadrant pain with MD. Has appointment later this week.   Discussed strategies to improve  constipation. Encouraged patient to take nausea medications to improve symptoms. Contact information provided to patient and encouraged to contact me with questions or concerns    MONITORING, EVALUATION, GOAL: Patient will consume adequate calories and protein to maintain lean body mass   NEXT VISIT: patient to contact me as needed  Gwendlyn Hanback B. Zenia Resides, [place], [place] Registered Dietitian [phone] (pager)

## 2017-05-01 NOTE — Progress Notes (Signed)
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  INTERVAL HISTORY: Patient returns to clinic today for further evaluation and consideration of cycle 4 of 4 of Adriamycin and Cytoxan. She currently feels well and is asymptomatic. Her cough and congestion have resolved. She has no neurologic complaints. She has a good appetite and denies weight loss. She has no chest pain or shortness of breath. She denies any nausea, vomiting, constipation, or diarrhea. She has no urinary complaints. Patient offers no specific complaints today.  REVIEW OF SYSTEMS:   Review of Systems  Constitutional: Negative.  Negative for fever, malaise/fatigue and weight loss.  HENT: Negative.  Negative for congestion.   Respiratory: Negative.  Negative for cough and shortness of breath.   Cardiovascular: Negative.  Negative for chest pain and leg swelling.  Gastrointestinal: Negative.  Negative for abdominal pain.  Genitourinary: Negative.   Musculoskeletal: Negative.   Skin: Negative.  Negative for rash.  Neurological: Negative.  Negative for weakness.  Psychiatric/Behavioral: Negative.  The patient is not nervous/anxious.     As per HPI. Otherwise, a complete review of systems is negative.  PAST MEDICAL HISTORY: Past Medical History:  Diagnosis Date  . Arthritis    SHOULDER  . Cancer ([facility]) 02/28/2017   INVASIVE MAMMARY CARCINOMA WITH MUCINOUS FEATURES.  . Hypertension   . Irregular heart beat    PT STATES IT "SKIPS A BEAT"   . Obesity     PAST SURGICAL HISTORY: Past  Surgical History:  Procedure Laterality Date  . ANKLE SURGERY    . BREAST BIOPSY Right 02/28/2017   INVASIVE MAMMARY CARCINOMA WITH MUCINOUS FEATURES.  Marland Kitchen BREAST CYST ASPIRATION Right    NEG  . PORTACATH PLACEMENT Left 03/15/2017   Procedure: INSERTION PORT-A-CATH;  Surgeon: Chelsea Bellow, MD;  Location: [facility] ORS;  Service: General;  Laterality: Left;    FAMILY HISTORY: Family History  Problem Relation Age of Onset  . Diabetes Father   . Stroke Father   . Breast cancer Mother 60  . Brain cancer Maternal Aunt 60  . Colon cancer Neg Hx     ADVANCED DIRECTIVES (Y/N):  N  HEALTH MAINTENANCE: Social History  Substance Use Topics  . Smoking status: Current Every Day Smoker    Packs/day: 0.25    Years: 25.00    Types: Cigarettes  . Smokeless tobacco: Never Used  . Alcohol use Yes     Comment: occas     Colonoscopy:  PAP:  Bone density:  Lipid panel:  Allergies  Allergen Reactions  . Other Hives and Itching    [name]     Current Outpatient Prescriptions  Medication Sig Dispense Refill  . lidocaine-prilocaine (EMLA) cream Apply to affected area once 30 g 3  . magic mouthwash w/lidocaine SOLN Take 5 mLs by mouth 4 (four) times daily as needed for mouth pain. 480 mL 3  . ondansetron (ZOFRAN) 8 MG tablet Take 1 tablet (8 mg total) by mouth 2 (two) times daily as needed. 60 tablet  3  . prochlorperazine (COMPAZINE) 10 MG tablet Take 1 tablet (10 mg total) by mouth every 6 (six) hours as needed (Nausea or vomiting). 60 tablet 3  . triamterene-hydrochlorothiazide (MAXZIDE-25) 37.5-25 MG tablet Take 1 tablet by mouth daily. 90 tablet 3  . levofloxacin (LEVAQUIN) 500 MG tablet Take 500 mg by mouth daily.     No current facility-administered medications for this visit.     OBJECTIVE: Vitals:   05/02/17 1056  BP: 135/86  Pulse: (!) 46  Resp: 20  Temp: (!) 96.7 F (35.9  C)     Body mass index is 39.01 kg/m.    ECOG FS:0 - Asymptomatic  General: Well-developed, well-nourished, no acute distress. Eyes: Pink conjunctiva, anicteric sclera. Breasts: Patient requested exam be deferred today. Lungs: Clear to auscultation bilaterally. Heart: Regular rate and rhythm. No rubs, murmurs, or gallops. Abdomen: Soft, nontender, nondistended. No organomegaly noted, normoactive bowel sounds. Musculoskeletal: No edema, cyanosis, or clubbing. Neuro: Alert, answering all questions appropriately. Cranial nerves grossly intact. Skin: No rashes or petechiae noted. Psych: Normal affect. : Thank you well-known will start  LAB RESULTS:  Lab Results  Component Value Date   NA 138 05/02/2017   K 3.1 (L) 05/02/2017   CL 104 05/02/2017   CO2 27 05/02/2017   GLUCOSE 195 (H) 05/02/2017   BUN 13 05/02/2017   CREATININE 0.83 05/02/2017   CALCIUM 8.9 05/02/2017   PROT 7.0 05/02/2017   ALBUMIN 3.4 (L) 05/02/2017   AST 21 05/02/2017   ALT 13 (L) 05/02/2017   ALKPHOS 84 05/02/2017   BILITOT 0.2 (L) 05/02/2017   GFRNONAA >60 05/02/2017   GFRAA >60 05/02/2017    Lab Results  Component Value Date   WBC 5.1 05/02/2017   NEUTROABS 3.9 05/02/2017   HGB 8.6 (L) 05/02/2017   HCT 25.8 (L) 05/02/2017   MCV 74.8 (L) 05/02/2017   PLT 308 05/02/2017     STUDIES: No results found.  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  PLAN:   1. 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 Pretreatment MUGA revealed an EF of 59%. PET scan results reviewed independently with no obvious metastatic disease, but FDG positive area and  her sigmoid colon. After the completion of her neoadjuvant chemotherapy, she will require definitive surgery and possibly adjuvant XRT. Finally, given the ER/PR status of her tumor she will benefit from either tamoxifen or an aromatase inhibitor for total 5 years. Proceed with cycle 4 of 4 of Adriamycin and Cytoxan today. Return to clinic in 2 weeks for consideration of cycle 1 of 12 of weekly Taxol. 2. Genetic testing: Given patient's age of under 26, will order genetic testing. 3. Sigmoid colon: PET positivity noted, likely physiologic or possibly diverticulosis. Once patient completes her treatment for breast cancer, will refer for colonoscopy for further evaluation. 4. Cough/congestion: Resolved.  Patient expressed understanding and was in agreement with this plan. She also understands that She can call clinic at any time with any questions, concerns, or complaints.   Cancer Staging Malignant neoplasm of upper-outer quadrant of right breast in female, estrogen receptor positive ([place]) Staging form: Breast, AJCC 8th Edition - Clinical stage from 03/05/2017: Stage IIA (cT2, cN2a,  cM0, G2, ER: Positive, PR: Positive, HER2: Negative) - Signed by Lloyd Huger, MD on 03/19/2017   Lloyd Huger, MD   05/04/2017 2:40 PM

## 2017-05-02 ENCOUNTER — Inpatient Hospital Stay: Payer: Medicaid Other

## 2017-05-02 ENCOUNTER — Inpatient Hospital Stay (HOSPITAL_BASED_OUTPATIENT_CLINIC_OR_DEPARTMENT_OTHER): Payer: Medicaid Other | Admitting: Oncology

## 2017-05-02 VITALS — BP 135/86 | HR 46 | Temp 96.7°F | Resp 20 | Wt 236.2 lb

## 2017-05-02 DIAGNOSIS — Z803 Family history of malignant neoplasm of breast: Secondary | ICD-10-CM | POA: Diagnosis not present

## 2017-05-02 DIAGNOSIS — M129 Arthropathy, unspecified: Secondary | ICD-10-CM | POA: Diagnosis not present

## 2017-05-02 DIAGNOSIS — I499 Cardiac arrhythmia, unspecified: Secondary | ICD-10-CM | POA: Diagnosis not present

## 2017-05-02 DIAGNOSIS — Z79899 Other long term (current) drug therapy: Secondary | ICD-10-CM | POA: Diagnosis not present

## 2017-05-02 DIAGNOSIS — E669 Obesity, unspecified: Secondary | ICD-10-CM

## 2017-05-02 DIAGNOSIS — C50411 Malignant neoplasm of upper-outer quadrant of right female breast: Secondary | ICD-10-CM

## 2017-05-02 DIAGNOSIS — F17210 Nicotine dependence, cigarettes, uncomplicated: Secondary | ICD-10-CM | POA: Diagnosis not present

## 2017-05-02 DIAGNOSIS — Z170 Estrogen receptor positive status [ER+]: Principal | ICD-10-CM

## 2017-05-02 DIAGNOSIS — I10 Essential (primary) hypertension: Secondary | ICD-10-CM

## 2017-05-02 DIAGNOSIS — Z5111 Encounter for antineoplastic chemotherapy: Secondary | ICD-10-CM | POA: Diagnosis present

## 2017-05-02 DIAGNOSIS — Z7689 Persons encountering health services in other specified circumstances: Secondary | ICD-10-CM | POA: Diagnosis not present

## 2017-05-02 DIAGNOSIS — D649 Anemia, unspecified: Secondary | ICD-10-CM | POA: Diagnosis not present

## 2017-05-02 LAB — CBC WITH DIFFERENTIAL/PLATELET
BASOS ABS: 0 10*3/uL (ref 0–0.1)
BASOS PCT: 0 %
EOS ABS: 0 10*3/uL (ref 0–0.7)
Eosinophils Relative: 0 %
HEMATOCRIT: 25.8 % — AB (ref 35.0–47.0)
HEMOGLOBIN: 8.6 g/dL — AB (ref 12.0–16.0)
Lymphocytes Relative: 12 %
Lymphs Abs: 0.6 10*3/uL — ABNORMAL LOW (ref 1.0–3.6)
MCH: 24.9 pg — ABNORMAL LOW (ref 26.0–34.0)
MCHC: 33.4 g/dL (ref 32.0–36.0)
MCV: 74.8 fL — ABNORMAL LOW (ref 80.0–100.0)
Monocytes Absolute: 0.5 10*3/uL (ref 0.2–0.9)
Monocytes Relative: 10 %
NEUTROS ABS: 3.9 10*3/uL (ref 1.4–6.5)
Neutrophils Relative %: 78 %
Platelets: 308 10*3/uL (ref 150–440)
RBC: 3.45 MIL/uL — ABNORMAL LOW (ref 3.80–5.20)
RDW: 17.8 % — ABNORMAL HIGH (ref 11.5–14.5)
WBC: 5.1 10*3/uL (ref 3.6–11.0)

## 2017-05-02 LAB — COMPREHENSIVE METABOLIC PANEL
ALBUMIN: 3.4 g/dL — AB (ref 3.5–5.0)
ALK PHOS: 84 U/L (ref 38–126)
ALT: 13 U/L — ABNORMAL LOW (ref 14–54)
ANION GAP: 7 (ref 5–15)
AST: 21 U/L (ref 15–41)
BILIRUBIN TOTAL: 0.2 mg/dL — AB (ref 0.3–1.2)
BUN: 13 mg/dL (ref 6–20)
CALCIUM: 8.9 mg/dL (ref 8.9–10.3)
CO2: 27 mmol/L (ref 22–32)
Chloride: 104 mmol/L (ref 101–111)
Creatinine, Ser: 0.83 mg/dL (ref 0.44–1.00)
GFR calc Af Amer: >60 mL/min (ref >60)
GFR calc non Af Amer: >60 mL/min (ref >60)
GLUCOSE: 195 mg/dL — AB (ref 65–99)
POTASSIUM: 3.1 mmol/L — AB (ref 3.5–5.1)
SODIUM: 138 mmol/L (ref 135–145)
TOTAL PROTEIN: 7 g/dL (ref 6.5–8.1)

## 2017-05-02 MED ORDER — HEPARIN SOD (PORK) LOCK FLUSH 100 UNIT/ML IV SOLN
500.0000 [IU] | Freq: Once | INTRAVENOUS | Status: AC | PRN
  Administered 2017-05-02: 500 [IU]
  Filled 2017-05-02: qty 5

## 2017-05-02 MED ORDER — DOXORUBICIN HCL CHEMO IV INJECTION 2 MG/ML
60.0000 mg/m2 | Freq: Once | INTRAVENOUS | Status: AC
  Administered 2017-05-02: 138 mg via INTRAVENOUS
  Filled 2017-05-02: qty 69

## 2017-05-02 MED ORDER — PEGFILGRASTIM 6 MG/0.6ML ~~LOC~~ PSKT
6.0000 mg | PREFILLED_SYRINGE | Freq: Once | SUBCUTANEOUS | Status: AC
  Administered 2017-05-02: 6 mg via SUBCUTANEOUS
  Filled 2017-05-02: qty 0.6

## 2017-05-02 MED ORDER — SODIUM CHLORIDE 0.9 % IV SOLN
Freq: Once | INTRAVENOUS | Status: AC
  Administered 2017-05-02: 12:00:00 via INTRAVENOUS
  Filled 2017-05-02: qty 5

## 2017-05-02 MED ORDER — SODIUM CHLORIDE 0.9% FLUSH
10.0000 mL | INTRAVENOUS | Status: DC | PRN
  Administered 2017-05-02: 10 mL
  Filled 2017-05-02: qty 10

## 2017-05-02 MED ORDER — SODIUM CHLORIDE 0.9 % IV SOLN
600.0000 mg/m2 | Freq: Once | INTRAVENOUS | Status: AC
  Administered 2017-05-02: 1380 mg via INTRAVENOUS
  Filled 2017-05-02: qty 50

## 2017-05-02 MED ORDER — SODIUM CHLORIDE 0.9 % IV SOLN
Freq: Once | INTRAVENOUS | Status: AC
  Administered 2017-05-02: 11:00:00 via INTRAVENOUS
  Filled 2017-05-02: qty 1000

## 2017-05-02 MED ORDER — PALONOSETRON HCL INJECTION 0.25 MG/5ML
0.2500 mg | Freq: Once | INTRAVENOUS | Status: AC
  Administered 2017-05-02: 0.25 mg via INTRAVENOUS
  Filled 2017-05-02: qty 5

## 2017-05-02 NOTE — Progress Notes (Signed)
Patient denies any concerns today.  

## 2017-05-15 NOTE — Progress Notes (Signed)
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  INTERVAL HISTORY: Patient returns to clinic today for further evaluation and consideration of cycle 1 of 12 of weekly Taxol. She currently feels well and is asymptomatic. She has no neurologic complaints. She denies any recent fevers or illnesses. She has a good appetite and denies weight loss. She has no chest pain or shortness of breath. She denies any nausea, vomiting, constipation, or diarrhea. She has no urinary complaints. Patient offers no specific complaints today.  REVIEW OF SYSTEMS:   Review of Systems  Constitutional: Negative.  Negative for fever, malaise/fatigue and weight loss.  HENT: Negative.  Negative for congestion.   Respiratory: Negative.  Negative for cough and shortness of breath.   Cardiovascular: Negative.  Negative for chest pain and leg swelling.  Gastrointestinal: Negative.  Negative for abdominal pain.  Genitourinary: Negative.   Musculoskeletal: Negative.   Skin: Negative.  Negative for rash.  Neurological: Negative.  Negative for weakness.  Psychiatric/Behavioral: Negative.  The patient is not nervous/anxious.     As per HPI. Otherwise, a complete review of systems is negative.  PAST MEDICAL HISTORY: Past Medical History:  Diagnosis Date  . Arthritis    SHOULDER  . Cancer ([facility]) 02/28/2017   INVASIVE MAMMARY CARCINOMA WITH MUCINOUS FEATURES.  . Hypertension   . Irregular heart beat    PT STATES IT "SKIPS A BEAT"   . Obesity     PAST SURGICAL HISTORY: Past Surgical  History:  Procedure Laterality Date  . ANKLE SURGERY    . BREAST BIOPSY Right 02/28/2017   INVASIVE MAMMARY CARCINOMA WITH MUCINOUS FEATURES.  Marland Kitchen BREAST CYST ASPIRATION Right    NEG  . PORTACATH PLACEMENT Left 03/15/2017   Procedure: INSERTION PORT-A-CATH;  Surgeon: Earline Mayotte, MD;  Location: [facility] ORS;  Service: General;  Laterality: Left;    FAMILY HISTORY: Family History  Problem Relation Age of Onset  . Diabetes Father   . Stroke Father   . Breast cancer Mother 20  . Brain cancer Maternal Aunt 60  . Colon cancer Neg Hx     ADVANCED DIRECTIVES (Y/N):  N  HEALTH MAINTENANCE: Social History  Substance Use Topics  . Smoking status: Current Every Day Smoker    Packs/day: 0.25    Years: 25.00    Types: Cigarettes  . Smokeless tobacco: Never Used  . Alcohol use Yes     Comment: occas     Colonoscopy:  PAP:  Bone density:  Lipid panel:  Allergies  Allergen Reactions  . Other Hives and Itching    [name]     Current Outpatient Prescriptions  Medication Sig Dispense Refill  . levofloxacin (LEVAQUIN) 500 MG tablet Take 500 mg by mouth daily.    Marland Kitchen lidocaine-prilocaine (EMLA) cream Apply to affected area once 30 g 3  . magic mouthwash w/lidocaine SOLN Take 5 mLs by mouth 4 (four) times daily as needed for mouth pain. 480 mL 3  . ondansetron (ZOFRAN) 8 MG tablet Take  1 tablet (8 mg total) by mouth 2 (two) times daily as needed. 60 tablet 3  . prochlorperazine (COMPAZINE) 10 MG tablet Take 1 tablet (10 mg total) by mouth every 6 (six) hours as needed (Nausea or vomiting). 60 tablet 3  . triamterene-hydrochlorothiazide (MAXZIDE-25) 37.5-25 MG tablet Take 1 tablet by mouth daily. 90 tablet 3   No current facility-administered medications for this visit.     OBJECTIVE: Vitals:   05/16/17 0945  BP: 125/84  Pulse: 83  Resp: 20  Temp: 97.8 F (36.6 C)     Body  mass index is 40.19 kg/m.    ECOG FS:0 - Asymptomatic  General: Well-developed, well-nourished, no acute distress. Eyes: Pink conjunctiva, anicteric sclera. Breasts: Patient requested exam be deferred today. Lungs: Clear to auscultation bilaterally. Heart: Regular rate and rhythm. No rubs, murmurs, or gallops. Abdomen: Soft, nontender, nondistended. No organomegaly noted, normoactive bowel sounds. Musculoskeletal: No edema, cyanosis, or clubbing. Neuro: Alert, answering all questions appropriately. Cranial nerves grossly intact. Skin: No rashes or petechiae noted. Psych: Normal affect.  LAB RESULTS:  Lab Results  Component Value Date   NA 137 05/16/2017   K 3.2 (L) 05/16/2017   CL 105 05/16/2017   CO2 25 05/16/2017   GLUCOSE 152 (H) 05/16/2017   BUN 11 05/16/2017   CREATININE 0.89 05/16/2017   CALCIUM 8.5 (L) 05/16/2017   PROT 6.1 (L) 05/16/2017   ALBUMIN 2.9 (L) 05/16/2017   AST 15 05/16/2017   ALT 10 (L) 05/16/2017   ALKPHOS 81 05/16/2017   BILITOT 0.2 (L) 05/16/2017   GFRNONAA >60 05/16/2017   GFRAA >60 05/16/2017    Lab Results  Component Value Date   WBC 6.8 05/16/2017   NEUTROABS 5.6 05/16/2017   HGB 6.5 (L) 05/16/2017   HCT 19.6 (L) 05/16/2017   MCV 77.8 (L) 05/16/2017   PLT 261 05/16/2017     STUDIES: No results found.  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  PLAN:   1. 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 Pretreatment MUGA revealed an EF of 59%. PET scan results reviewed independently with no obvious metastatic disease, but FDG positive area and her sigmoid colon. After the completion of  her neoadjuvant chemotherapy, she will require definitive surgery and possibly adjuvant XRT. Finally, given the ER/PR status of her tumor she will benefit from either tamoxifen or an aromatase inhibitor for total 5 years. Patient completed 4 cycles of Adriamycin and Cytoxan. Proceed with cycle 1 of 12 of weekly Taxol. Return to clinic in 1 week for consideration of cycle 2. 2. Genetic testing: Given patient's age of under 86, will order genetic testing. 3. Sigmoid colon: PET positivity noted, likely physiologic or possibly diverticulosis. Once patient completes her treatment for breast cancer, will refer for colonoscopy for further evaluation. 4. Anemia: Monitor closely. Proceed with treatment as above and patient will return to clinic on Friday for 2 units of packed red blood cells.  Patient expressed understanding and was in agreement with this plan. She also understands that She can call clinic at any time with any questions, concerns, or complaints.   Cancer Staging Malignant neoplasm of upper-outer quadrant of right breast in female, estrogen receptor  positive ([place]) Staging form: Breast, AJCC 8th Edition - Clinical stage from 03/05/2017: Stage IIA (cT2, cN2a, cM0, G2, ER: Positive, PR: Positive, HER2: Negative) - Signed by Lloyd Huger, MD on 03/19/2017   Lloyd Huger, MD   05/19/2017 9:22 AM

## 2017-05-16 ENCOUNTER — Inpatient Hospital Stay: Payer: Medicaid Other

## 2017-05-16 ENCOUNTER — Inpatient Hospital Stay (HOSPITAL_BASED_OUTPATIENT_CLINIC_OR_DEPARTMENT_OTHER): Payer: Medicaid Other | Admitting: Oncology

## 2017-05-16 VITALS — BP 114/82 | HR 87 | Resp 18

## 2017-05-16 VITALS — BP 125/84 | HR 83 | Temp 97.8°F | Resp 20 | Wt 243.4 lb

## 2017-05-16 DIAGNOSIS — I10 Essential (primary) hypertension: Secondary | ICD-10-CM | POA: Diagnosis not present

## 2017-05-16 DIAGNOSIS — I499 Cardiac arrhythmia, unspecified: Secondary | ICD-10-CM

## 2017-05-16 DIAGNOSIS — C50411 Malignant neoplasm of upper-outer quadrant of right female breast: Secondary | ICD-10-CM | POA: Diagnosis not present

## 2017-05-16 DIAGNOSIS — Z170 Estrogen receptor positive status [ER+]: Principal | ICD-10-CM

## 2017-05-16 DIAGNOSIS — Z7689 Persons encountering health services in other specified circumstances: Secondary | ICD-10-CM | POA: Diagnosis not present

## 2017-05-16 DIAGNOSIS — Z803 Family history of malignant neoplasm of breast: Secondary | ICD-10-CM

## 2017-05-16 DIAGNOSIS — D649 Anemia, unspecified: Secondary | ICD-10-CM

## 2017-05-16 DIAGNOSIS — F17210 Nicotine dependence, cigarettes, uncomplicated: Secondary | ICD-10-CM

## 2017-05-16 DIAGNOSIS — Z79899 Other long term (current) drug therapy: Secondary | ICD-10-CM

## 2017-05-16 DIAGNOSIS — E669 Obesity, unspecified: Secondary | ICD-10-CM

## 2017-05-16 DIAGNOSIS — Z5111 Encounter for antineoplastic chemotherapy: Secondary | ICD-10-CM | POA: Diagnosis not present

## 2017-05-16 DIAGNOSIS — M129 Arthropathy, unspecified: Secondary | ICD-10-CM

## 2017-05-16 LAB — CBC WITH DIFFERENTIAL/PLATELET
BASOS ABS: 0 10*3/uL (ref 0–0.1)
BASOS PCT: 0 %
EOS ABS: 0 10*3/uL (ref 0–0.7)
EOS PCT: 0 %
HCT: 19.6 % — ABNORMAL LOW (ref 35.0–47.0)
Hemoglobin: 6.5 g/dL — ABNORMAL LOW (ref 12.0–16.0)
LYMPHS PCT: 8 %
Lymphs Abs: 0.5 10*3/uL — ABNORMAL LOW (ref 1.0–3.6)
MCH: 25.9 pg — ABNORMAL LOW (ref 26.0–34.0)
MCHC: 33.3 g/dL (ref 32.0–36.0)
MCV: 77.8 fL — AB (ref 80.0–100.0)
MONO ABS: 0.6 10*3/uL (ref 0.2–0.9)
Monocytes Relative: 9 %
Neutro Abs: 5.6 10*3/uL (ref 1.4–6.5)
Neutrophils Relative %: 83 %
PLATELETS: 261 10*3/uL (ref 150–440)
RBC: 2.52 MIL/uL — AB (ref 3.80–5.20)
RDW: 20 % — AB (ref 11.5–14.5)
WBC: 6.8 10*3/uL (ref 3.6–11.0)

## 2017-05-16 LAB — PREPARE RBC (CROSSMATCH)

## 2017-05-16 LAB — COMPREHENSIVE METABOLIC PANEL
ALBUMIN: 2.9 g/dL — AB (ref 3.5–5.0)
ALT: 10 U/L — AB (ref 14–54)
AST: 15 U/L (ref 15–41)
Alkaline Phosphatase: 81 U/L (ref 38–126)
Anion gap: 7 (ref 5–15)
BUN: 11 mg/dL (ref 6–20)
CHLORIDE: 105 mmol/L (ref 101–111)
CO2: 25 mmol/L (ref 22–32)
CREATININE: 0.89 mg/dL (ref 0.44–1.00)
Calcium: 8.5 mg/dL — ABNORMAL LOW (ref 8.9–10.3)
GFR calc Af Amer: >60 mL/min (ref >60)
GFR calc non Af Amer: >60 mL/min (ref >60)
GLUCOSE: 152 mg/dL — AB (ref 65–99)
POTASSIUM: 3.2 mmol/L — AB (ref 3.5–5.1)
SODIUM: 137 mmol/L (ref 135–145)
Total Bilirubin: 0.2 mg/dL — ABNORMAL LOW (ref 0.3–1.2)
Total Protein: 6.1 g/dL — ABNORMAL LOW (ref 6.5–8.1)

## 2017-05-16 LAB — ABO/RH: ABO/RH(D): O POS

## 2017-05-16 MED ORDER — SODIUM CHLORIDE 0.9 % IV SOLN
Freq: Once | INTRAVENOUS | Status: AC
  Administered 2017-05-16: 11:00:00 via INTRAVENOUS
  Filled 2017-05-16: qty 1000

## 2017-05-16 MED ORDER — DIPHENHYDRAMINE HCL 50 MG/ML IJ SOLN
25.0000 mg | Freq: Once | INTRAMUSCULAR | Status: AC
  Administered 2017-05-16: 25 mg via INTRAVENOUS
  Filled 2017-05-16: qty 1

## 2017-05-16 MED ORDER — SODIUM CHLORIDE 0.9 % IV SOLN
80.0000 mg/m2 | Freq: Once | INTRAVENOUS | Status: AC
  Administered 2017-05-16: 186 mg via INTRAVENOUS
  Filled 2017-05-16: qty 31

## 2017-05-16 MED ORDER — SODIUM CHLORIDE 0.9 % IV SOLN: 10.0000 mg | Freq: Once | INTRAVENOUS | Status: DC

## 2017-05-16 MED ORDER — DEXAMETHASONE SODIUM PHOSPHATE 10 MG/ML IJ SOLN
10.0000 mg | Freq: Once | INTRAMUSCULAR | Status: AC
  Administered 2017-05-16: 10 mg via INTRAVENOUS
  Filled 2017-05-16: qty 1

## 2017-05-16 MED ORDER — SODIUM CHLORIDE 0.9% FLUSH
10.0000 mL | INTRAVENOUS | Status: DC | PRN
  Administered 2017-05-16: 10 mL via INTRAVENOUS
  Filled 2017-05-16: qty 10

## 2017-05-16 MED ORDER — HEPARIN SOD (PORK) LOCK FLUSH 100 UNIT/ML IV SOLN
500.0000 [IU] | Freq: Once | INTRAVENOUS | Status: AC
  Administered 2017-05-16: 500 [IU] via INTRAVENOUS
  Filled 2017-05-16: qty 5

## 2017-05-16 MED ORDER — FAMOTIDINE IN NACL 20-0.9 MG/50ML-% IV SOLN
20.0000 mg | Freq: Once | INTRAVENOUS | Status: AC
  Administered 2017-05-16: 20 mg via INTRAVENOUS
  Filled 2017-05-16: qty 50

## 2017-05-16 NOTE — Progress Notes (Signed)
Hgb: 6.5. MD, Dr. Grayland Ormond, already aware. Per MD order: proceed with scheduled treatment today. Patient returning to clinic on Friday, 6/29, for blood transfusion.

## 2017-05-16 NOTE — Progress Notes (Signed)
Patient denies any concerns today.  

## 2017-05-18 ENCOUNTER — Inpatient Hospital Stay: Payer: Medicaid Other

## 2017-05-18 DIAGNOSIS — C50411 Malignant neoplasm of upper-outer quadrant of right female breast: Secondary | ICD-10-CM

## 2017-05-18 DIAGNOSIS — Z170 Estrogen receptor positive status [ER+]: Principal | ICD-10-CM

## 2017-05-18 DIAGNOSIS — Z5111 Encounter for antineoplastic chemotherapy: Secondary | ICD-10-CM | POA: Diagnosis not present

## 2017-05-18 MED ORDER — HEPARIN SOD (PORK) LOCK FLUSH 100 UNIT/ML IV SOLN
500.0000 [IU] | Freq: Every day | INTRAVENOUS | Status: AC | PRN
  Administered 2017-05-18: 500 [IU]

## 2017-05-18 MED ORDER — SODIUM CHLORIDE 0.9% FLUSH
10.0000 mL | INTRAVENOUS | Status: AC | PRN
  Administered 2017-05-18: 10 mL
  Filled 2017-05-18: qty 10

## 2017-05-18 MED ORDER — ACETAMINOPHEN 325 MG PO TABS
650.0000 mg | ORAL_TABLET | Freq: Once | ORAL | Status: AC
  Administered 2017-05-18: 650 mg via ORAL

## 2017-05-18 MED ORDER — SODIUM CHLORIDE 0.9 % IV SOLN
250.0000 mL | Freq: Once | INTRAVENOUS | Status: AC
  Administered 2017-05-18: 250 mL via INTRAVENOUS
  Filled 2017-05-18: qty 250

## 2017-05-18 MED ORDER — DIPHENHYDRAMINE HCL 50 MG/ML IJ SOLN
25.0000 mg | Freq: Once | INTRAMUSCULAR | Status: AC
  Administered 2017-05-18: 25 mg via INTRAVENOUS

## 2017-05-19 LAB — BPAM RBC
BLOOD PRODUCT EXPIRATION DATE: 201807112359
BLOOD PRODUCT EXPIRATION DATE: 201807112359
ISSUE DATE / TIME: 201806290951
ISSUE DATE / TIME: 201806291145
UNIT TYPE AND RH: 5100
UNIT TYPE AND RH: 5100

## 2017-05-19 LAB — TYPE AND SCREEN
ABO/RH(D): O POS
ANTIBODY SCREEN: NEGATIVE
UNIT DIVISION: 0
UNIT DIVISION: 0

## 2017-05-21 NOTE — Progress Notes (Signed)
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  INTERVAL HISTORY: Patient returns to clinic today for further evaluation and consideration of cycle 2 of 12 of weekly Taxol. She feels significantly improved after receiving 2 units of packed red blood cells last week. She tolerated Taxol well without significant side effects. She is complaining of swollen and painful joints in her hands that has been present for several weeks but she has failed to mention it. She has no neurologic complaints. She denies any recent fevers or illnesses. She has a good appetite and denies weight loss. She has no chest pain or shortness of breath. She denies any nausea, vomiting, constipation, or diarrhea. She has no urinary complaints. Patient offers no further specific complaints today.  REVIEW OF SYSTEMS:   Review of Systems  Constitutional: Negative.  Negative for fever, malaise/fatigue and weight loss.  HENT: Negative.  Negative for congestion.   Respiratory: Negative.  Negative for cough and shortness of breath.   Cardiovascular: Negative.  Negative for chest pain and leg swelling.  Gastrointestinal: Negative.  Negative for abdominal pain.  Genitourinary: Negative.   Musculoskeletal: Positive for joint pain.  Skin: Negative.  Negative for rash.  Neurological: Negative.  Negative for sensory change and weakness.  Psychiatric/Behavioral: Negative.  The patient is not nervous/anxious.     As per HPI. Otherwise, a complete review of systems is negative.  PAST MEDICAL  HISTORY: Past Medical History:  Diagnosis Date  . Arthritis    SHOULDER  . Cancer ([place]) 02/28/2017   INVASIVE MAMMARY CARCINOMA WITH MUCINOUS FEATURES.  . Hypertension   . Irregular heart beat    PT STATES IT "SKIPS A BEAT"   . Obesity     PAST SURGICAL HISTORY: Past Surgical History:  Procedure Laterality Date  . ANKLE SURGERY    . BREAST BIOPSY Right 02/28/2017   INVASIVE MAMMARY CARCINOMA WITH MUCINOUS FEATURES.  Marland Kitchen BREAST CYST ASPIRATION Right    NEG  . PORTACATH PLACEMENT Left 03/15/2017   Procedure: INSERTION PORT-A-CATH;  Surgeon: Robert Bellow, MD;  Location: [facility] ORS;  Service: General;  Laterality: Left;    FAMILY HISTORY: Family History  Problem Relation Age of Onset  . Diabetes Father   . Stroke Father   . Breast cancer Mother 88  . Brain cancer Maternal Aunt 60  . Colon cancer Neg Hx     ADVANCED DIRECTIVES (Y/N):  N  HEALTH MAINTENANCE: Social History  Substance Use Topics  . Smoking status: Current Every Day Smoker    Packs/day: 0.25    Years: 25.00    Types: Cigarettes  . Smokeless tobacco: Never Used  . Alcohol use Yes     Comment: occas     Colonoscopy:  PAP:  Bone density:  Lipid panel:  Allergies  Allergen Reactions  . Other Hives and Itching    [name]     Current Outpatient Prescriptions  Medication Sig Dispense Refill  . levofloxacin (LEVAQUIN) 500 MG tablet Take 500 mg by mouth  daily.    . lidocaine-prilocaine (EMLA) cream Apply to affected area once 30 g 3  . magic mouthwash w/lidocaine SOLN Take 5 mLs by mouth 4 (four) times daily as needed for mouth pain. 480 mL 3  . ondansetron (ZOFRAN) 8 MG tablet Take 1 tablet (8 mg total) by mouth 2 (two) times daily as needed. 60 tablet 3  . prochlorperazine (COMPAZINE) 10 MG tablet Take 1 tablet (10 mg total) by mouth every 6 (six) hours as needed (Nausea or vomiting). 60 tablet 3    . triamterene-hydrochlorothiazide (MAXZIDE-25) 37.5-25 MG tablet Take 1 tablet by mouth daily. 90 tablet 3  . predniSONE (STERAPRED UNI-PAK 21 TAB) 5 MG (21) TBPK tablet Taper as directed 21 tablet 0   No current facility-administered medications for this visit.    Facility-Administered Medications Ordered in Other Visits  Medication Dose Route Frequency Provider Last Rate Last Dose  . 0.9 %  sodium chloride infusion   Intravenous Once Lloyd Huger, MD      . dexamethasone (DECADRON) injection 10 mg  10 mg Intravenous Once Lloyd Huger, MD      . diphenhydrAMINE (BENADRYL) injection 25 mg  25 mg Intravenous Once Lloyd Huger, MD      . famotidine (PEPCID) IVPB 20 mg premix  20 mg Intravenous Once Lloyd Huger, MD      . heparin lock flush 100 unit/mL  500 Units Intracatheter Once PRN Lloyd Huger, MD      . PACLitaxel (TAXOL) 186 mg in sodium chloride 0.9 % 250 mL chemo infusion (</= 71m/m2)  80 mg/m2 (Treatment Plan Recorded) Intravenous Once FLloyd Huger MD        OBJECTIVE: Vitals:   05/24/17 1010  BP: 132/88  Pulse: 92  Resp: 20  Temp: (!) 96.5 F (35.8 C)     Body mass index is 39.75 kg/m.    ECOG FS:0 - Asymptomatic  General: Well-developed, well-nourished, no acute distress. Eyes: Pink conjunctiva, anicteric sclera. Breasts: Patient requested exam be deferred today. Lungs: Clear to auscultation bilaterally. Heart: Regular rate and rhythm. No rubs, murmurs, or gallops. Abdomen: Soft, nontender, nondistended. No organomegaly noted, normoactive bowel sounds. Musculoskeletal: No edema, cyanosis, or clubbing. Neuro: Alert, answering all questions appropriately. Cranial nerves grossly intact. Skin: No rashes or petechiae noted. Psych: Normal affect.  LAB RESULTS:  Lab Results  Component Value Date   NA 136 05/24/2017   K 3.6 05/24/2017   CL 103 05/24/2017   CO2 26 05/24/2017   GLUCOSE 120 (H) 05/24/2017   BUN 11 05/24/2017    CREATININE 0.80 05/24/2017   CALCIUM 9.0 05/24/2017   PROT 6.9 05/24/2017   ALBUMIN 3.3 (L) 05/24/2017   AST 19 05/24/2017   ALT 19 05/24/2017   ALKPHOS 82 05/24/2017   BILITOT 0.4 05/24/2017   GFRNONAA >60 05/24/2017   GFRAA >60 05/24/2017    Lab Results  Component Value Date   WBC 6.7 05/24/2017   NEUTROABS 5.3 05/24/2017   HGB 10.1 (L) 05/24/2017   HCT 29.4 (L) 05/24/2017   MCV 80.8 05/24/2017   PLT 354 05/24/2017     STUDIES: No results found.  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  PLAN:   1. 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 Pretreatment MUGA revealed an EF of 59%. PET scan results reviewed independently with no obvious metastatic disease, but FDG positive area and her sigmoid colon. After the completion of her neoadjuvant chemotherapy, she will require definitive surgery and possibly adjuvant XRT. Finally, given the ER/PR status of her tumor she will benefit from either tamoxifen or an aromatase inhibitor for total 5 years. Patient completed 4 cycles of Adriamycin and Cytoxan. Proceed with cycle 2 of 12 of weekly Taxol. Return to clinic in 1 week for consideration of cycle 3. 2. Genetic testing: Given patient's age of under 71, will order genetic testing. 3. Sigmoid colon: PET positivity noted, likely physiologic or possibly diverticulosis. Once patient completes her treatment for breast cancer, will refer for colonoscopy for further evaluation. 4. Anemia: Significantly improved after receiving 2 units packed red blood cells last week. 5. Joint pain: Patient was given a prescription for Medrol Dosepak.  Monitor.   Patient expressed understanding and was in agreement with this plan. She also understands that She can call clinic at any time with any questions, concerns, or complaints.   Cancer Staging Malignant neoplasm of upper-outer quadrant of right breast in female, estrogen receptor positive ([employer]) Staging form: Breast, AJCC 8th Edition - Clinical stage from 03/05/2017: Stage IIA (cT2, cN2a, cM0, G2, ER: Positive, PR: Positive, HER2: Negative) - Signed by Lloyd Huger, MD on 03/19/2017   Lloyd Huger, MD   05/24/2017 11:24 AM

## 2017-05-24 ENCOUNTER — Inpatient Hospital Stay: Payer: Medicaid Other | Attending: Oncology

## 2017-05-24 ENCOUNTER — Inpatient Hospital Stay (HOSPITAL_BASED_OUTPATIENT_CLINIC_OR_DEPARTMENT_OTHER): Payer: Medicaid Other | Admitting: Oncology

## 2017-05-24 ENCOUNTER — Inpatient Hospital Stay: Payer: Medicaid Other

## 2017-05-24 VITALS — BP 132/88 | HR 92 | Temp 96.5°F | Resp 20 | Wt 240.7 lb

## 2017-05-24 DIAGNOSIS — R933 Abnormal findings on diagnostic imaging of other parts of digestive tract: Secondary | ICD-10-CM | POA: Insufficient documentation

## 2017-05-24 DIAGNOSIS — Z170 Estrogen receptor positive status [ER+]: Principal | ICD-10-CM

## 2017-05-24 DIAGNOSIS — Z79899 Other long term (current) drug therapy: Secondary | ICD-10-CM | POA: Diagnosis not present

## 2017-05-24 DIAGNOSIS — E669 Obesity, unspecified: Secondary | ICD-10-CM | POA: Diagnosis not present

## 2017-05-24 DIAGNOSIS — D649 Anemia, unspecified: Secondary | ICD-10-CM | POA: Insufficient documentation

## 2017-05-24 DIAGNOSIS — Z800 Family history of malignant neoplasm of digestive organs: Secondary | ICD-10-CM | POA: Diagnosis not present

## 2017-05-24 DIAGNOSIS — I10 Essential (primary) hypertension: Secondary | ICD-10-CM | POA: Diagnosis not present

## 2017-05-24 DIAGNOSIS — Z5111 Encounter for antineoplastic chemotherapy: Secondary | ICD-10-CM | POA: Diagnosis present

## 2017-05-24 DIAGNOSIS — C50411 Malignant neoplasm of upper-outer quadrant of right female breast: Secondary | ICD-10-CM | POA: Insufficient documentation

## 2017-05-24 DIAGNOSIS — F17210 Nicotine dependence, cigarettes, uncomplicated: Secondary | ICD-10-CM

## 2017-05-24 DIAGNOSIS — Z803 Family history of malignant neoplasm of breast: Secondary | ICD-10-CM | POA: Insufficient documentation

## 2017-05-24 DIAGNOSIS — G629 Polyneuropathy, unspecified: Secondary | ICD-10-CM | POA: Insufficient documentation

## 2017-05-24 DIAGNOSIS — R109 Unspecified abdominal pain: Secondary | ICD-10-CM | POA: Diagnosis not present

## 2017-05-24 DIAGNOSIS — M2550 Pain in unspecified joint: Secondary | ICD-10-CM

## 2017-05-24 LAB — COMPREHENSIVE METABOLIC PANEL
ALT: 19 U/L (ref 14–54)
AST: 19 U/L (ref 15–41)
Albumin: 3.3 g/dL — ABNORMAL LOW (ref 3.5–5.0)
Alkaline Phosphatase: 82 U/L (ref 38–126)
Anion gap: 7 (ref 5–15)
BUN: 11 mg/dL (ref 6–20)
CHLORIDE: 103 mmol/L (ref 101–111)
CO2: 26 mmol/L (ref 22–32)
CREATININE: 0.8 mg/dL (ref 0.44–1.00)
Calcium: 9 mg/dL (ref 8.9–10.3)
Glucose, Bld: 120 mg/dL — ABNORMAL HIGH (ref 65–99)
POTASSIUM: 3.6 mmol/L (ref 3.5–5.1)
Sodium: 136 mmol/L (ref 135–145)
Total Bilirubin: 0.4 mg/dL (ref 0.3–1.2)
Total Protein: 6.9 g/dL (ref 6.5–8.1)

## 2017-05-24 LAB — CBC WITH DIFFERENTIAL/PLATELET
BASOS ABS: 0 10*3/uL (ref 0–0.1)
BASOS PCT: 1 %
Eosinophils Absolute: 0 10*3/uL (ref 0–0.7)
Eosinophils Relative: 0 %
HEMATOCRIT: 29.4 % — AB (ref 35.0–47.0)
HEMOGLOBIN: 10.1 g/dL — AB (ref 12.0–16.0)
Lymphocytes Relative: 10 %
Lymphs Abs: 0.7 10*3/uL — ABNORMAL LOW (ref 1.0–3.6)
MCH: 27.9 pg (ref 26.0–34.0)
MCHC: 34.5 g/dL (ref 32.0–36.0)
MCV: 80.8 fL (ref 80.0–100.0)
MONOS PCT: 10 %
Monocytes Absolute: 0.6 10*3/uL (ref 0.2–0.9)
NEUTROS ABS: 5.3 10*3/uL (ref 1.4–6.5)
NEUTROS PCT: 79 %
Platelets: 354 10*3/uL (ref 150–440)
RBC: 3.64 MIL/uL — ABNORMAL LOW (ref 3.80–5.20)
RDW: 22.9 % — ABNORMAL HIGH (ref 11.5–14.5)
WBC: 6.7 10*3/uL (ref 3.6–11.0)

## 2017-05-24 MED ORDER — DIPHENHYDRAMINE HCL 50 MG/ML IJ SOLN
25.0000 mg | Freq: Once | INTRAMUSCULAR | Status: AC
  Administered 2017-05-24: 25 mg via INTRAVENOUS

## 2017-05-24 MED ORDER — FAMOTIDINE IN NACL 20-0.9 MG/50ML-% IV SOLN
20.0000 mg | Freq: Once | INTRAVENOUS | Status: AC
  Administered 2017-05-24: 20 mg via INTRAVENOUS

## 2017-05-24 MED ORDER — PREDNISONE 5 MG (21) PO TBPK: ORAL_TABLET | ORAL | 0 refills | Status: DC

## 2017-05-24 MED ORDER — PACLITAXEL CHEMO INJECTION 300 MG/50ML
80.0000 mg/m2 | Freq: Once | INTRAVENOUS | Status: AC
  Administered 2017-05-24: 186 mg via INTRAVENOUS
  Filled 2017-05-24: qty 31

## 2017-05-24 MED ORDER — DEXAMETHASONE SODIUM PHOSPHATE 10 MG/ML IJ SOLN
10.0000 mg | Freq: Once | INTRAMUSCULAR | Status: AC
  Administered 2017-05-24: 10 mg via INTRAVENOUS

## 2017-05-24 MED ORDER — SODIUM CHLORIDE 0.9 % IV SOLN
Freq: Once | INTRAVENOUS | Status: AC
  Administered 2017-05-24: 11:00:00 via INTRAVENOUS
  Filled 2017-05-24: qty 1000

## 2017-05-24 MED ORDER — HEPARIN SOD (PORK) LOCK FLUSH 100 UNIT/ML IV SOLN
500.0000 [IU] | Freq: Once | INTRAVENOUS | Status: AC | PRN
  Administered 2017-05-24: 500 [IU]

## 2017-05-24 NOTE — Progress Notes (Signed)
Patient reports continued abdominal pain in right side, rates pain 7/10 this morning.

## 2017-05-29 NOTE — Progress Notes (Signed)
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  INTERVAL HISTORY: Patient returns to clinic today for further evaluation and consideration of cycle 3 of 12 of weekly Taxol. She does not complain of joint pain today, but admits to mild peripheral neuropathy. She has no other neurologic complaints. She denies any recent fevers or illnesses. She has a good appetite and denies weight loss. She has no chest pain or shortness of breath. She denies any nausea, vomiting, constipation, or diarrhea. She has no urinary complaints. Patient offers no further specific complaints today.  REVIEW OF SYSTEMS:   Review of Systems  Constitutional: Negative.  Negative for fever, malaise/fatigue and weight loss.  HENT: Negative.  Negative for congestion.   Respiratory: Negative.  Negative for cough and shortness of breath.   Cardiovascular: Negative.  Negative for chest pain and leg swelling.  Gastrointestinal: Negative.  Negative for abdominal pain.  Genitourinary: Negative.   Musculoskeletal: Positive for joint pain.  Skin: Negative.  Negative for rash.  Neurological: Positive for sensory change. Negative for weakness.  Psychiatric/Behavioral: Negative.  The patient is not nervous/anxious.     As per HPI. Otherwise, a complete review of systems is negative.  PAST MEDICAL HISTORY: Past Medical History:  Diagnosis Date  . Arthritis    SHOULDER  . Cancer ([place]) 02/28/2017   INVASIVE MAMMARY CARCINOMA WITH MUCINOUS FEATURES.  . Hypertension   . Irregular heart beat    PT STATES IT "SKIPS A BEAT"   . Obesity     PAST SURGICAL HISTORY: Past Surgical History:  Procedure Laterality Date  . ANKLE SURGERY    . BREAST BIOPSY Right 02/28/2017   INVASIVE MAMMARY CARCINOMA WITH MUCINOUS FEATURES.  Marland Kitchen BREAST CYST ASPIRATION Right    NEG  . PORTACATH PLACEMENT Left 03/15/2017   Procedure: INSERTION PORT-A-CATH;  Surgeon: Robert Bellow, MD;  Location: [facility] ORS;  Service: General;  Laterality: Left;    FAMILY HISTORY: Family History  Problem Relation Age of Onset  . Diabetes Father   . Stroke Father   . Breast cancer Mother 14  . Brain cancer Maternal Aunt 60  . Colon cancer Neg Hx     ADVANCED DIRECTIVES (Y/N):  N  HEALTH MAINTENANCE: Social History  Substance Use Topics  . Smoking status: Current Every Day Smoker    Packs/day: 0.25    Years: 25.00    Types: Cigarettes  . Smokeless tobacco: Never Used  . Alcohol use Yes     Comment: occas     Colonoscopy:  PAP:  Bone density:  Lipid panel:  Allergies  Allergen Reactions  . Other Hives and Itching    [name]     Current Outpatient Prescriptions  Medication Sig Dispense Refill  . lidocaine-prilocaine (EMLA) cream Apply to affected area once 30 g 3  . ondansetron (ZOFRAN) 8 MG tablet Take 1 tablet (8 mg total) by mouth 2 (two) times daily as needed. 60 tablet 3  . predniSONE (STERAPRED UNI-PAK 21 TAB) 5 MG (21)  TBPK tablet Taper as directed 21 tablet 0  . prochlorperazine (COMPAZINE) 10 MG tablet Take 1 tablet (10 mg total) by mouth every 6 (six) hours as needed (Nausea or vomiting). 60 tablet 3  . triamterene-hydrochlorothiazide (MAXZIDE-25) 37.5-25 MG tablet Take 1 tablet by mouth daily. 90 tablet 3  . levofloxacin (LEVAQUIN) 500 MG tablet Take 500 mg by mouth daily.    . magic mouthwash w/lidocaine SOLN Take 5 mLs by mouth 4 (four) times daily as needed for mouth pain. (Patient not  taking: Reported on 05/30/2017) 480 mL 3   No current facility-administered medications for this visit.     OBJECTIVE: Vitals:   05/30/17 1009  BP: 115/74  Pulse: (!) 102  Resp: 18  Temp: 99.1 F (37.3 C)     Body mass index is 40.31 kg/m.    ECOG FS:0 - Asymptomatic  General: Well-developed, well-nourished, no acute distress. Eyes: Pink conjunctiva, anicteric sclera. Breasts: Patient requested exam be deferred today. Lungs: Clear to auscultation bilaterally. Heart: Regular rate and rhythm. No rubs, murmurs, or gallops. Abdomen: Soft, nontender, nondistended. No organomegaly noted, normoactive bowel sounds. Musculoskeletal: No edema, cyanosis, or clubbing. Neuro: Alert, answering all questions appropriately. Cranial nerves grossly intact. Skin: No rashes or petechiae noted. Psych: Normal affect.  LAB RESULTS:  Lab Results  Component Value Date   NA 136 05/30/2017   K 3.9 05/30/2017   CL 102 05/30/2017   CO2 27 05/30/2017   GLUCOSE 198 (H) 05/30/2017   BUN 14 05/30/2017   CREATININE 0.72 05/30/2017   CALCIUM 9.1 05/30/2017   PROT 6.5 05/30/2017   ALBUMIN 3.2 (L) 05/30/2017   AST 21 05/30/2017   ALT 19 05/30/2017   ALKPHOS 84 05/30/2017   BILITOT 0.4 05/30/2017   GFRNONAA >60 05/30/2017   GFRAA >60 05/30/2017    Lab Results  Component Value Date   WBC 5.5 05/30/2017   NEUTROABS 4.6 05/30/2017   HGB 9.6 (L) 05/30/2017   HCT 27.9 (L) 05/30/2017   MCV 82.3 05/30/2017   PLT 383 05/30/2017     STUDIES: No results found.  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  PLAN:   1. 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 Pretreatment MUGA revealed an EF of 59%. PET scan results reviewed independently with no obvious metastatic disease, but FDG positive area and her sigmoid colon. After the completion of her neoadjuvant chemotherapy, she will require definitive surgery and possibly adjuvant XRT. Finally, given the ER/PR status of her tumor she will benefit from either tamoxifen or an aromatase inhibitor for total 5 years. Patient completed 4 cycles of Adriamycin and Cytoxan. Proceed with cycle 3 of 12 of weekly Taxol. Will dose reduce Taxol 10% given her peripheral neuropathy. Return to clinic in 1 week for consideration of cycle 4 and then in 2 weeks for further evaluation and consideration of cycle 5. 2. Genetic testing: Results are pending at time of dictation. 3. Sigmoid colon: PET positivity noted, likely physiologic or possibly diverticulosis. Once patient completes her treatment for breast cancer, will refer for colonoscopy for further evaluation. 4. Anemia: Significantly improved after receiving 2 units packed red blood cells previously. 5. Joint pain: Improved. Patient does  not complain of pain today. She was recently given a prescription for Medrol Dosepak. Monitor.  6. Peripheral neuropathy: Dose reduce Taxol as above.  Patient expressed understanding and was in agreement with this plan. She also understands that She can call clinic at any time with any questions, concerns, or complaints.   Cancer Staging Malignant neoplasm of upper-outer quadrant of right breast in female, estrogen receptor positive ([place]) Staging form: Breast, AJCC 8th Edition - Clinical stage from 03/05/2017: Stage IIA (cT2, cN2a, cM0, G2, ER: Positive, PR: Positive, HER2: Negative) - Signed by Lloyd Huger, MD on 03/19/2017   Lloyd Huger, MD   06/01/2017 1:12 PM

## 2017-05-30 ENCOUNTER — Ambulatory Visit: Payer: Medicaid Other

## 2017-05-30 ENCOUNTER — Inpatient Hospital Stay: Payer: Medicaid Other

## 2017-05-30 ENCOUNTER — Inpatient Hospital Stay (HOSPITAL_BASED_OUTPATIENT_CLINIC_OR_DEPARTMENT_OTHER): Payer: Medicaid Other | Admitting: Oncology

## 2017-05-30 VITALS — BP 115/74 | HR 102 | Temp 99.1°F | Resp 18 | Wt 244.1 lb

## 2017-05-30 DIAGNOSIS — M2550 Pain in unspecified joint: Secondary | ICD-10-CM | POA: Diagnosis not present

## 2017-05-30 DIAGNOSIS — G629 Polyneuropathy, unspecified: Secondary | ICD-10-CM | POA: Diagnosis not present

## 2017-05-30 DIAGNOSIS — C50411 Malignant neoplasm of upper-outer quadrant of right female breast: Secondary | ICD-10-CM

## 2017-05-30 DIAGNOSIS — Z170 Estrogen receptor positive status [ER+]: Secondary | ICD-10-CM

## 2017-05-30 DIAGNOSIS — R933 Abnormal findings on diagnostic imaging of other parts of digestive tract: Secondary | ICD-10-CM | POA: Diagnosis not present

## 2017-05-30 DIAGNOSIS — F17210 Nicotine dependence, cigarettes, uncomplicated: Secondary | ICD-10-CM

## 2017-05-30 DIAGNOSIS — Z800 Family history of malignant neoplasm of digestive organs: Secondary | ICD-10-CM

## 2017-05-30 DIAGNOSIS — Z79899 Other long term (current) drug therapy: Secondary | ICD-10-CM | POA: Diagnosis not present

## 2017-05-30 DIAGNOSIS — I10 Essential (primary) hypertension: Secondary | ICD-10-CM

## 2017-05-30 DIAGNOSIS — E669 Obesity, unspecified: Secondary | ICD-10-CM

## 2017-05-30 DIAGNOSIS — Z803 Family history of malignant neoplasm of breast: Secondary | ICD-10-CM | POA: Diagnosis not present

## 2017-05-30 DIAGNOSIS — D649 Anemia, unspecified: Secondary | ICD-10-CM

## 2017-05-30 DIAGNOSIS — Z5111 Encounter for antineoplastic chemotherapy: Secondary | ICD-10-CM | POA: Diagnosis not present

## 2017-05-30 LAB — CBC WITH DIFFERENTIAL/PLATELET
BASOS PCT: 1 %
Basophils Absolute: 0 10*3/uL (ref 0–0.1)
EOS ABS: 0.1 10*3/uL (ref 0–0.7)
EOS PCT: 1 %
HCT: 27.9 % — ABNORMAL LOW (ref 35.0–47.0)
HEMOGLOBIN: 9.6 g/dL — AB (ref 12.0–16.0)
Lymphocytes Relative: 9 %
Lymphs Abs: 0.5 10*3/uL — ABNORMAL LOW (ref 1.0–3.6)
MCH: 28.4 pg (ref 26.0–34.0)
MCHC: 34.5 g/dL (ref 32.0–36.0)
MCV: 82.3 fL (ref 80.0–100.0)
Monocytes Absolute: 0.4 10*3/uL (ref 0.2–0.9)
Monocytes Relative: 7 %
NEUTROS PCT: 82 %
Neutro Abs: 4.6 10*3/uL (ref 1.4–6.5)
PLATELETS: 383 10*3/uL (ref 150–440)
RBC: 3.39 MIL/uL — AB (ref 3.80–5.20)
RDW: 23.2 % — ABNORMAL HIGH (ref 11.5–14.5)
WBC: 5.5 10*3/uL (ref 3.6–11.0)

## 2017-05-30 LAB — COMPREHENSIVE METABOLIC PANEL
ALBUMIN: 3.2 g/dL — AB (ref 3.5–5.0)
ALK PHOS: 84 U/L (ref 38–126)
ALT: 19 U/L (ref 14–54)
ANION GAP: 7 (ref 5–15)
AST: 21 U/L (ref 15–41)
BUN: 14 mg/dL (ref 6–20)
CALCIUM: 9.1 mg/dL (ref 8.9–10.3)
CHLORIDE: 102 mmol/L (ref 101–111)
CO2: 27 mmol/L (ref 22–32)
Creatinine, Ser: 0.72 mg/dL (ref 0.44–1.00)
GFR calc non Af Amer: >60 mL/min (ref >60)
GLUCOSE: 198 mg/dL — AB (ref 65–99)
Potassium: 3.9 mmol/L (ref 3.5–5.1)
SODIUM: 136 mmol/L (ref 135–145)
Total Bilirubin: 0.4 mg/dL (ref 0.3–1.2)
Total Protein: 6.5 g/dL (ref 6.5–8.1)

## 2017-05-30 MED ORDER — FAMOTIDINE IN NACL 20-0.9 MG/50ML-% IV SOLN
20.0000 mg | Freq: Once | INTRAVENOUS | Status: AC
  Administered 2017-05-30: 20 mg via INTRAVENOUS

## 2017-05-30 MED ORDER — DEXAMETHASONE SODIUM PHOSPHATE 10 MG/ML IJ SOLN
10.0000 mg | Freq: Once | INTRAMUSCULAR | Status: AC
  Administered 2017-05-30: 10 mg via INTRAVENOUS

## 2017-05-30 MED ORDER — SODIUM CHLORIDE 0.9% FLUSH
10.0000 mL | INTRAVENOUS | Status: DC | PRN
  Filled 2017-05-30: qty 10

## 2017-05-30 MED ORDER — HEPARIN SOD (PORK) LOCK FLUSH 100 UNIT/ML IV SOLN
500.0000 [IU] | Freq: Once | INTRAVENOUS | Status: AC | PRN
  Administered 2017-05-30: 500 [IU]

## 2017-05-30 MED ORDER — SODIUM CHLORIDE 0.9 % IV SOLN
72.0000 mg/m2 | Freq: Once | INTRAVENOUS | Status: AC
  Administered 2017-05-30: 168 mg via INTRAVENOUS
  Filled 2017-05-30: qty 28

## 2017-05-30 MED ORDER — DIPHENHYDRAMINE HCL 50 MG/ML IJ SOLN
25.0000 mg | Freq: Once | INTRAMUSCULAR | Status: AC
Start: 2017-05-30 — End: 2017-05-30
  Administered 2017-05-30: 25 mg via INTRAVENOUS

## 2017-05-30 MED ORDER — SODIUM CHLORIDE 0.9 % IV SOLN
Freq: Once | INTRAVENOUS | Status: AC
  Administered 2017-05-30: 11:00:00 via INTRAVENOUS
  Filled 2017-05-30: qty 1000

## 2017-05-30 NOTE — Progress Notes (Signed)
Patient is here for follow up. She does mention some numbness and tingling in her hands and feet.

## 2017-06-04 NOTE — Progress Notes (Unsigned)
PSN sent patient information about how to apply for Social Security Disability.  PSN included this worker's contact information in case questions arose.

## 2017-06-06 ENCOUNTER — Inpatient Hospital Stay: Payer: Medicaid Other

## 2017-06-06 ENCOUNTER — Ambulatory Visit (INDEPENDENT_AMBULATORY_CARE_PROVIDER_SITE_OTHER): Payer: Medicaid Other | Admitting: Nurse Practitioner

## 2017-06-06 ENCOUNTER — Encounter: Payer: Self-pay | Admitting: Nurse Practitioner

## 2017-06-06 VITALS — BP 112/73 | HR 87 | Temp 98.5°F | Ht 67.0 in | Wt 241.0 lb

## 2017-06-06 VITALS — BP 123/83 | HR 83 | Temp 97.3°F | Resp 20

## 2017-06-06 DIAGNOSIS — R109 Unspecified abdominal pain: Secondary | ICD-10-CM

## 2017-06-06 DIAGNOSIS — B9689 Other specified bacterial agents as the cause of diseases classified elsewhere: Secondary | ICD-10-CM

## 2017-06-06 DIAGNOSIS — N760 Acute vaginitis: Secondary | ICD-10-CM | POA: Diagnosis not present

## 2017-06-06 DIAGNOSIS — Z5111 Encounter for antineoplastic chemotherapy: Secondary | ICD-10-CM | POA: Diagnosis not present

## 2017-06-06 DIAGNOSIS — C50411 Malignant neoplasm of upper-outer quadrant of right female breast: Secondary | ICD-10-CM

## 2017-06-06 DIAGNOSIS — Z170 Estrogen receptor positive status [ER+]: Principal | ICD-10-CM

## 2017-06-06 LAB — POCT WET PREP (WET MOUNT): Trichomonas Wet Prep HPF POC: ABSENT

## 2017-06-06 LAB — CBC WITH DIFFERENTIAL/PLATELET
BASOS ABS: 0 10*3/uL (ref 0–0.1)
BASOS PCT: 0 %
EOS ABS: 0.1 10*3/uL (ref 0–0.7)
Eosinophils Relative: 2 %
HCT: 28.1 % — ABNORMAL LOW (ref 35.0–47.0)
HEMOGLOBIN: 9.6 g/dL — AB (ref 12.0–16.0)
Lymphocytes Relative: 9 %
Lymphs Abs: 0.5 10*3/uL — ABNORMAL LOW (ref 1.0–3.6)
MCH: 28.4 pg (ref 26.0–34.0)
MCHC: 34.1 g/dL (ref 32.0–36.0)
MCV: 83.2 fL (ref 80.0–100.0)
MONOS PCT: 7 %
Monocytes Absolute: 0.4 10*3/uL (ref 0.2–0.9)
NEUTROS PCT: 82 %
Neutro Abs: 4.4 10*3/uL (ref 1.4–6.5)
Platelets: 367 10*3/uL (ref 150–440)
RBC: 3.38 MIL/uL — ABNORMAL LOW (ref 3.80–5.20)
RDW: 23.3 % — AB (ref 11.5–14.5)
WBC: 5.4 10*3/uL (ref 3.6–11.0)

## 2017-06-06 LAB — COMPREHENSIVE METABOLIC PANEL
ALBUMIN: 3.3 g/dL — AB (ref 3.5–5.0)
ALK PHOS: 76 U/L (ref 38–126)
ALT: 19 U/L (ref 14–54)
ANION GAP: 6 (ref 5–15)
AST: 21 U/L (ref 15–41)
BUN: 12 mg/dL (ref 6–20)
CO2: 26 mmol/L (ref 22–32)
Calcium: 8.9 mg/dL (ref 8.9–10.3)
Chloride: 103 mmol/L (ref 101–111)
Creatinine, Ser: 0.78 mg/dL (ref 0.44–1.00)
GFR calc Af Amer: >60 mL/min (ref >60)
GFR calc non Af Amer: >60 mL/min (ref >60)
GLUCOSE: 147 mg/dL — AB (ref 65–99)
POTASSIUM: 3.3 mmol/L — AB (ref 3.5–5.1)
SODIUM: 135 mmol/L (ref 135–145)
Total Bilirubin: 0.5 mg/dL (ref 0.3–1.2)
Total Protein: 7.1 g/dL (ref 6.5–8.1)

## 2017-06-06 MED ORDER — BACLOFEN 10 MG PO TABS: 10.0000 mg | ORAL_TABLET | Freq: Three times a day (TID) | ORAL | 0 refills | Status: DC

## 2017-06-06 MED ORDER — METRONIDAZOLE 0.75 % VA GEL: 1.0000 | Freq: Every day | VAGINAL | 0 refills | Status: AC

## 2017-06-06 MED ORDER — DEXAMETHASONE SODIUM PHOSPHATE 10 MG/ML IJ SOLN
10.0000 mg | Freq: Once | INTRAMUSCULAR | Status: AC
  Administered 2017-06-06: 10 mg via INTRAVENOUS
  Filled 2017-06-06: qty 1

## 2017-06-06 MED ORDER — HEPARIN SOD (PORK) LOCK FLUSH 100 UNIT/ML IV SOLN
500.0000 [IU] | Freq: Once | INTRAVENOUS | Status: AC | PRN
  Administered 2017-06-06: 500 [IU]
  Filled 2017-06-06: qty 5

## 2017-06-06 MED ORDER — FAMOTIDINE IN NACL 20-0.9 MG/50ML-% IV SOLN
20.0000 mg | Freq: Once | INTRAVENOUS | Status: AC
  Administered 2017-06-06: 20 mg via INTRAVENOUS
  Filled 2017-06-06: qty 50

## 2017-06-06 MED ORDER — SODIUM CHLORIDE 0.9 % IV SOLN
Freq: Once | INTRAVENOUS | Status: AC
  Administered 2017-06-06: 11:00:00 via INTRAVENOUS
  Filled 2017-06-06: qty 1000

## 2017-06-06 MED ORDER — SODIUM CHLORIDE 0.9% FLUSH
10.0000 mL | INTRAVENOUS | Status: DC | PRN
  Administered 2017-06-06: 10 mL
  Filled 2017-06-06: qty 10

## 2017-06-06 MED ORDER — PACLITAXEL CHEMO INJECTION 300 MG/50ML
72.0000 mg/m2 | Freq: Once | INTRAVENOUS | Status: AC
  Administered 2017-06-06: 168 mg via INTRAVENOUS
  Filled 2017-06-06: qty 28

## 2017-06-06 MED ORDER — DIPHENHYDRAMINE HCL 50 MG/ML IJ SOLN
25.0000 mg | Freq: Once | INTRAMUSCULAR | Status: AC
  Administered 2017-06-06: 25 mg via INTRAVENOUS
  Filled 2017-06-06: qty 1

## 2017-06-06 NOTE — Progress Notes (Signed)
I have reviewed this encounter including the documentation in this note and/or discussed this patient with the provider, Cassell Smiles, AGPCNP-BC. I am certifying that I agree with the content of this note as supervising physician.  Nobie Putnam, [facility] 06/06/2017, 1:28 PM

## 2017-06-06 NOTE — Progress Notes (Signed)
Subjective:    Patient ID: Chelsea Hudson, female    DOB: 1965/12/13, 51 y.o.   MRN: [id number]  Chelsea Hudson is a 51 y.o. female presenting on 06/06/2017 for Abdominal Pain (intrmittent sharp pain in the admomen that associated w/ gas symptoms  )   HPI   Abd pain of LLQ LLQ abdomen pain x 2 weeks.  Sat/Sun was up all night in sharp pain. Pt describes her pain as sharp 10/10 pain, followed by bubbles, then pain subsides.  Sometimes doesn't feel these bubbles.  Pt notes that this pain is worse after eating, but denies any other GI tract symptoms including diarrhea, constipation, nausea, vomiting. She admits to having twice daily BM, soft formed stool.  She denies any heavy lifting or other injury.   Pt is postmenopausal female who is not currently sexually active.  She notes no uterine/vaginal bleeding and has not undergone hysterectomy or oophrectomy.   Pertinent history: pt is on tamoxifen for breast cancer treatment and has been experiencing paresthesias associated w/ peripheral neuropathy.  Followed by Dr. Grayland Ormond.  Social History  Substance Use Topics  . Smoking status: Current Every Day Smoker    Packs/day: 0.25    Years: 25.00    Types: Cigarettes  . Smokeless tobacco: Never Used  . Alcohol use Yes     Comment: occas    Review of Systems Per HPI unless specifically indicated above     Objective:    BP 112/73 (BP Location: Left Arm, Patient Position: Sitting, Cuff Size: Large)   Pulse 87   Temp 98.5 F (36.9 C) (Oral)   Ht 5\' 7"  (1.702 m)   Wt 241 lb (109.3 kg)   LMP 01/31/2014 (Approximate) Comment: LMP was 3 years ago.  BMI 37.75 kg/m   Wt Readings from Last 3 Encounters:  06/06/17 241 lb (109.3 kg)  05/30/17 244 lb 1.6 oz (110.7 kg)  05/24/17 240 lb 11.2 oz (109.2 kg)    Physical Exam General - obese, well-appearing, NAD HEENT - Normocephalic, atraumatic, nearly complete hair loss 2/2 chemotherapy. Heart - RRR, no murmurs heard Lungs - Clear  throughout all lobes, no wheezing, crackles, or rhonchi. Normal work of breathing. Abdomen - soft, NTND except in LLQ where pt is tender to light palpation. Isotonic abdominal wall muscle engagement does not reproduce the pain, but pain is greater when palpated during muscle contraction.   No masses, no hepatosplenomegaly, active bowel sounds. GU - Normal external female genitalia without lesions or fusion. Vaginal canal without lesions. Normal appearing cervix without lesions or friability.  Thin white discharge on exam. Bimanual exam without adnexal masses, enlarged uterus, or cervical motion tenderness. Extremeties - non-tender, no edema, cap refill < 2 seconds, peripheral pulses intact +2 bilaterally Skin - warm, dry, no rashes Neuro - awake, alert, oriented x3, intact muscle strength 5/5 bilaterally, intact distal sensation to light touch, normal coordination, normal gait Psych - Normal mood and affect, normal behavior     Results for orders placed or performed in visit on 06/06/17  POCT Wet Prep [facility])  Result Value Ref Range   Source Wet Prep POC vagina    WBC, Wet Prep HPF POC     Bacteria Wet Prep HPF POC Many (A) Few   BACTERIA WET PREP MORPHOLOGY POC     Clue Cells Wet Prep HPF POC Too numerous to count  (A) None   Clue Cells Wet Prep Whiff POC     Yeast Wet Prep HPF  POC None    KOH Wet Prep POC     Trichomonas Wet Prep HPF POC Absent Absent      Assessment & Plan:   Problem List Items Addressed This Visit    None    Visit Diagnoses    Abdominal wall pain    -  Primary Abdominal wall pain likely muscle strain w/ worsening pain w/ muscle engagement/palpation.  Some correlation to meals and sensation of "bubbles" may indicate gas.  Possible neuropathic pain correlated to tamoxifen w/ paresthesias.  Pelvic etiology unlikely after pelvic exam, but may require additional testing w/ pelvic US if no resolution.  Plan: 1. Take baclofen 10 mg up to tid.  Can take 1/2 tablet  if makes pt drowsy. 2. Consider gabapentin if no relief w/ muscle relaxer. 3. Follow up in 1-2 weeks if symptoms persist or worsen.   Relevant Medications   baclofen (LIORESAL) 10 MG tablet   BV (bacterial vaginosis)     Incidental finding on pelvic exam.  Pt not sexually active.  Wet Prep confirms clue cells w/ WBC in vaginal secretion.  Plan: 1. Take Metrogel 1 applicatorful at hs for 5 days. 2. Follow up as needed.   Relevant Medications   metroNIDAZOLE (METROGEL VAGINAL) 0.75 % vaginal gel      Meds ordered this encounter  Medications  . metroNIDAZOLE (METROGEL VAGINAL) 0.75 % vaginal gel    Sig: Place 1 Applicatorful vaginally at bedtime.    Dispense:  50 g    Refill:  0    Order Specific Question:   Supervising Provider    Answer:   Olin Hauser [[id number]]  . baclofen (LIORESAL) 10 MG tablet    Sig: Take 1 tablet (10 mg total) by mouth 3 (three) times daily.    Dispense:  30 each    Refill:  0    Order Specific Question:   Supervising Provider    Answer:   Olin Hauser [[id number]]      Follow up plan: Return if symptoms worsen or fail to improve.   Cassell Smiles, DNP, AGPCNP-BC Adult Gerontology Primary Care Nurse Practitioner [facility] 06/06/2017, 1:03 PM

## 2017-06-06 NOTE — Patient Instructions (Signed)
Chelsea Hudson, Thank you for coming in to clinic today.  1. You likely are having some abdominal muscle wall pain. - Take baclofen 10 mg tablet up to three times daily.  It can make you sleepy, so only take 1/2 tablet during day if needed. - It could also be a nerve pain.  If your baclofen does not help, let me know and we can try a different medication for nerve pain.  Simply call the clinic.   2. You also have bacterial vaginosis today.  - Apply metrogel applicatorful each night for 5 nights.  Please schedule a follow-up appointment with Cassell Smiles, AGNP to Return if symptoms worsen or fail to improve.  If you have any other questions or concerns, please feel free to call the clinic or send a message through [facility]. You may also schedule an earlier appointment if necessary.  Cassell Smiles, DNP, AGNP-BC Adult Gerontology Nurse Practitioner [facility]    Bacterial Vaginosis Bacterial vaginosis is a vaginal infection that occurs when the normal balance of bacteria in the vagina is disrupted. It results from an overgrowth of certain bacteria. This is the most common vaginal infection among women ages 67-44. Because bacterial vaginosis increases your risk for STIs (sexually transmitted infections), getting treated can help reduce your risk for chlamydia, gonorrhea, herpes, and HIV (human immunodeficiency virus). Treatment is also important for preventing complications in pregnant women, because this condition can cause an early (premature) delivery. What are the causes? This condition is caused by an increase in harmful bacteria that are normally present in small amounts in the vagina. However, the reason that the condition develops is not fully understood. What increases the risk? The following factors may make you more likely to develop this condition:  Having a new sexual partner or multiple sexual partners.  Having unprotected sex.  Douching.  Having an  intrauterine device (IUD).  Smoking.  Drug and alcohol abuse.  Taking certain antibiotic medicines.  Being pregnant.  You cannot get bacterial vaginosis from toilet seats, bedding, swimming pools, or contact with objects around you. What are the signs or symptoms? Symptoms of this condition include:  Grey or white vaginal discharge. The discharge can also be watery or foamy.  A fish-like odor with discharge, especially after sexual intercourse or during menstruation.  Itching in and around the vagina.  Burning or pain with urination.  Some women with bacterial vaginosis have no signs or symptoms. How is this diagnosed? This condition is diagnosed based on:  Your medical history.  A physical exam of the vagina.  Testing a sample of vaginal fluid under a microscope to look for a large amount of bad bacteria or abnormal cells. Your health care provider may use a cotton swab or a small wooden spatula to collect the sample.  How is this treated? This condition is treated with antibiotics. These may be given as a pill, a vaginal cream, or a medicine that is put into the vagina (suppository). If the condition comes back after treatment, a second round of antibiotics may be needed. Follow these instructions at home: Medicines  Take over-the-counter and prescription medicines only as told by your health care provider.  Take or use your antibiotic as told by your health care provider. Do not stop taking or using the antibiotic even if you start to feel better. General instructions  If you have a female sexual partner, tell her that you have a vaginal infection. She should see her health care provider  and be treated if she has symptoms. If you have a female sexual partner, he does not need treatment.  During treatment: ? Avoid sexual activity until you finish treatment. ? Do not douche. ? Avoid alcohol as directed by your health care provider. ? Avoid breastfeeding as directed by  your health care provider.  Drink enough water and fluids to keep your urine clear or pale yellow.  Keep the area around your vagina and rectum clean. ? Wash the area daily with warm water. ? Wipe yourself from front to back after using the toilet.  Keep all follow-up visits as told by your health care provider. This is important. How is this prevented?  Do not douche.  Wash the outside of your vagina with warm water only.  Use protection when having sex. This includes latex condoms and dental dams.  Limit how many sexual partners you have. To help prevent bacterial vaginosis, it is best to have sex with just one partner (monogamous).  Make sure you and your sexual partner are tested for STIs.  Wear cotton or cotton-lined underwear.  Avoid wearing tight pants and pantyhose, especially during summer.  Limit the amount of alcohol that you drink.  Do not use any products that contain nicotine or tobacco, such as cigarettes and e-cigarettes. If you need help quitting, ask your health care provider.  Do not use illegal drugs. Where to find more information:  Centers for Disease Control and Prevention: AppraiserFraud.fi  American Sexual Health Association (ASHA): www.ashastd.org  U.S. Department of Health and Financial controller, Office on Women's Health: DustingSprays.pl or SecuritiesCard.it Contact a health care provider if:  Your symptoms do not improve, even after treatment.  You have more discharge or pain when urinating.  You have a fever.  You have pain in your abdomen.  You have pain during sex.  You have vaginal bleeding between periods. Summary  Bacterial vaginosis is a vaginal infection that occurs when the normal balance of bacteria in the vagina is disrupted.  Because bacterial vaginosis increases your risk for STIs (sexually transmitted infections), getting treated can help reduce your risk for chlamydia, gonorrhea,  herpes, and HIV (human immunodeficiency virus). Treatment is also important for preventing complications in pregnant women, because the condition can cause an early (premature) delivery.  This condition is treated with antibiotic medicines. These may be given as a pill, a vaginal cream, or a medicine that is put into the vagina (suppository). This information is not intended to replace advice given to you by your health care provider. Make sure you discuss any questions you have with your health care provider. Document Released: 11/06/2005 Document Revised: 07/22/2016 Document Reviewed: 07/22/2016 Elsevier Interactive Patient Education  2017 [employer].

## 2017-06-12 NOTE — Progress Notes (Signed)
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  INTERVAL HISTORY: Patient returns to clinic today for further evaluation and consideration of cycle 5 of 12 of weekly Taxol. 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 She also admits to left upper and lower abdominal pain that started about 1 month ago. It has recently started to bother her more, especially when having bowel movements. She denies blood in stools. She has no other neurologic complaints. She denies any recent fevers or illnesses. She has a good appetite and denies weight loss. She has no chest pain or shortness of breath. She denies any nausea, vomiting, constipation, or diarrhea. She has no urinary complaints. Patient offers no further specific complaints today.  REVIEW OF SYSTEMS:   Review of Systems  Constitutional: Negative.  Negative for fever, malaise/fatigue and weight loss.  HENT: Negative.  Negative for congestion.   Respiratory: Negative.  Negative for cough and shortness of breath.   Cardiovascular: Negative.  Negative for chest pain and leg swelling.  Gastrointestinal: Positive for abdominal pain.       Left upper and lower quadrants.   Genitourinary: Negative.   Skin: Negative.  Negative for rash.  Neurological: Positive for sensory change. Negative for weakness.  Psychiatric/Behavioral: Negative.  The patient is not nervous/anxious.     As per HPI. Otherwise, a complete review of systems is negative.  PAST MEDICAL  HISTORY: Past Medical History:  Diagnosis Date  . Arthritis    SHOULDER  . Cancer ([place]) 02/28/2017   INVASIVE MAMMARY CARCINOMA WITH MUCINOUS FEATURES.  . Hypertension   . Irregular heart beat    PT STATES IT "SKIPS A BEAT"   . Obesity     PAST SURGICAL HISTORY: Past Surgical History:  Procedure Laterality Date  . ANKLE SURGERY    . BREAST BIOPSY Right 02/28/2017   INVASIVE MAMMARY CARCINOMA WITH MUCINOUS FEATURES.  Marland Kitchen BREAST CYST ASPIRATION Right    NEG  . PORTACATH PLACEMENT Left 03/15/2017   Procedure: INSERTION PORT-A-CATH;  Surgeon: Robert Bellow, MD;  Location: [facility] ORS;  Service: General;  Laterality: Left;    FAMILY HISTORY: Family History  Problem Relation Age of Onset  . Diabetes Father   . Stroke Father   . Breast cancer Mother 74  . Brain cancer Maternal Aunt 60  . Colon cancer Neg Hx     ADVANCED DIRECTIVES (Y/N):  N  HEALTH MAINTENANCE: Social History  Substance Use Topics  . Smoking status: Current Every Day Smoker    Packs/day: 0.25    Years: 25.00    Types: Cigarettes  . Smokeless tobacco: Never Used  . Alcohol use Yes     Comment: occas     Colonoscopy:  PAP:  Bone density:  Lipid panel:  Allergies  Allergen Reactions  . Other Hives and Itching    [name]     Current Outpatient Prescriptions  Medication Sig Dispense Refill  . baclofen (LIORESAL) 10  MG tablet Take 1 tablet (10 mg total) by mouth 3 (three) times daily. 30 each 0  . lidocaine-prilocaine (EMLA) cream Apply to affected area once 30 g 3  . ondansetron (ZOFRAN) 8 MG tablet Take 1 tablet (8 mg total) by mouth 2 (two) times daily as needed. 60 tablet 3  . prochlorperazine (COMPAZINE) 10 MG tablet Take 1 tablet (10 mg total) by mouth every 6 (six) hours as needed (Nausea or vomiting). 60 tablet 3  . triamterene-hydrochlorothiazide (MAXZIDE-25) 37.5-25 MG tablet Take 1 tablet  by mouth daily. 90 tablet 3  . magic mouthwash w/lidocaine SOLN Take 5 mLs by mouth 4 (four) times daily as needed for mouth pain. (Patient not taking: Reported on 06/06/2017) 480 mL 3   No current facility-administered medications for this visit.    Facility-Administered Medications Ordered in Other Visits  Medication Dose Route Frequency Provider Last Rate Last Dose  . heparin lock flush 100 unit/mL  500 Units Intracatheter Once PRN Lloyd Huger, MD      . PACLitaxel (TAXOL) 168 mg in sodium chloride 0.9 % 250 mL chemo infusion (</= 26m/m2)  72 mg/m2 (Treatment Plan Recorded) Intravenous Once FLloyd Huger MD   Stopped at 06/13/17 1227    OBJECTIVE: Vitals:   06/13/17 1007  BP: 135/82  Pulse: 90  Resp: 18  Temp: (!) 97.2 F (36.2 C)     Body mass index is 38.51 kg/m.    ECOG FS:0 - Asymptomatic  General: Well-developed, well-nourished, no acute distress. Eyes: Pink conjunctiva, anicteric sclera. Breasts: Patient requested exam be deferred today. Lungs: Clear to auscultation bilaterally. Heart: Regular rate and rhythm. No rubs, murmurs, or gallops. Abdomen: Soft, tender, non-distended left upper and lower quadrant 6/10 pain with BM. No organomegaly noted, normoactive bowel sounds. Musculoskeletal: No edema, cyanosis, or clubbing. Neuro: Alert, answering all questions appropriately. Cranial nerves grossly intact. Skin: No rashes or petechiae noted. Psych: Normal affect.  LAB RESULTS:  Lab Results  Component Value Date   NA 134 (L) 06/13/2017   K 3.8 06/13/2017   CL 105 06/13/2017   CO2 24 06/13/2017   GLUCOSE 128 (H) 06/13/2017   BUN 14 06/13/2017   CREATININE 0.75 06/13/2017   CALCIUM 9.0 06/13/2017   PROT 6.8 06/13/2017   ALBUMIN 3.3 (L) 06/13/2017   AST 16 06/13/2017   ALT 20 06/13/2017   ALKPHOS 76 06/13/2017   BILITOT 0.4 06/13/2017   GFRNONAA >60 06/13/2017   GFRAA >60 06/13/2017    Lab Results  Component Value Date   WBC 6.1 06/13/2017    NEUTROABS 5.1 06/13/2017   HGB 9.4 (L) 06/13/2017   HCT 27.7 (L) 06/13/2017   MCV 84.7 06/13/2017   PLT 376 06/13/2017     STUDIES: No results found.  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  PLAN:   1. 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 Pretreatment MUGA revealed an EF of 59%. PET scan results reviewed independently with no obvious metastatic disease, but FDG positive area and her sigmoid colon. After the completion of her neoadjuvant chemotherapy, she will require definitive surgery and possibly adjuvant XRT. Finally, given the ER/PR status of her tumor she will benefit from  either tamoxifen or an aromatase inhibitor for total 5 years. Patient completed 4 cycles of Adriamycin and Cytoxan. Proceed with cycle 5 of 12 of weekly Taxol. Will dose reduce Taxol 10% given her peripheral neuropathy. Return to clinic in 1 week for consideration of cycle 6 and then in 2 weeks for further evaluation and consideration of cycle 7. 2. Genetic testing:  3. Left upper and lower abdominal pain: Previous PET scan did not possible diverticulitis. Will CT scan of abdomen/pelvis since she is symptomatic.  4. Anemia: Holding stable. Hemoglobin 9.4 today. 5. Joint pain: Improved. Patient does not complain of pain today. She was recently given a prescription for Medrol Dosepak. Monitor.  6. Peripheral neuropathy: Dose reduce Taxol as above. Fingers only today.   Patient expressed understanding and was in agreement with this plan. She also understands that She can call clinic at any time with any questions, concerns, or complaints.   Cancer  Staging Malignant neoplasm of upper-outer quadrant of right breast in female, estrogen receptor positive ([place]) Staging form: Breast, AJCC 8th Edition - Clinical stage from 03/05/2017: Stage IIA (cT2, cN2a, cM0, G2, ER: Positive, PR: Positive, HER2: Negative) - Signed by Lloyd Huger, MD on 03/19/2017   Chelsea Hawking, Chelsea Hudson   06/13/2017 11:47 AM

## 2017-06-13 ENCOUNTER — Inpatient Hospital Stay: Payer: Medicaid Other

## 2017-06-13 ENCOUNTER — Inpatient Hospital Stay (HOSPITAL_BASED_OUTPATIENT_CLINIC_OR_DEPARTMENT_OTHER): Payer: Medicaid Other | Admitting: Oncology

## 2017-06-13 VITALS — BP 135/82 | HR 90 | Temp 97.2°F | Resp 18 | Wt 245.9 lb

## 2017-06-13 DIAGNOSIS — R933 Abnormal findings on diagnostic imaging of other parts of digestive tract: Secondary | ICD-10-CM

## 2017-06-13 DIAGNOSIS — Z803 Family history of malignant neoplasm of breast: Secondary | ICD-10-CM

## 2017-06-13 DIAGNOSIS — Z5111 Encounter for antineoplastic chemotherapy: Secondary | ICD-10-CM | POA: Diagnosis not present

## 2017-06-13 DIAGNOSIS — E669 Obesity, unspecified: Secondary | ICD-10-CM | POA: Diagnosis not present

## 2017-06-13 DIAGNOSIS — Z800 Family history of malignant neoplasm of digestive organs: Secondary | ICD-10-CM

## 2017-06-13 DIAGNOSIS — C50411 Malignant neoplasm of upper-outer quadrant of right female breast: Secondary | ICD-10-CM

## 2017-06-13 DIAGNOSIS — I10 Essential (primary) hypertension: Secondary | ICD-10-CM | POA: Diagnosis not present

## 2017-06-13 DIAGNOSIS — M2550 Pain in unspecified joint: Secondary | ICD-10-CM

## 2017-06-13 DIAGNOSIS — Z170 Estrogen receptor positive status [ER+]: Principal | ICD-10-CM

## 2017-06-13 DIAGNOSIS — G629 Polyneuropathy, unspecified: Secondary | ICD-10-CM | POA: Diagnosis not present

## 2017-06-13 DIAGNOSIS — F17210 Nicotine dependence, cigarettes, uncomplicated: Secondary | ICD-10-CM

## 2017-06-13 DIAGNOSIS — D649 Anemia, unspecified: Secondary | ICD-10-CM

## 2017-06-13 DIAGNOSIS — R109 Unspecified abdominal pain: Secondary | ICD-10-CM

## 2017-06-13 DIAGNOSIS — Z79899 Other long term (current) drug therapy: Secondary | ICD-10-CM | POA: Diagnosis not present

## 2017-06-13 LAB — CBC WITH DIFFERENTIAL/PLATELET
BASOS PCT: 0 %
Basophils Absolute: 0 10*3/uL (ref 0–0.1)
Eosinophils Absolute: 0.1 10*3/uL (ref 0–0.7)
Eosinophils Relative: 1 %
HEMATOCRIT: 27.7 % — AB (ref 35.0–47.0)
HEMOGLOBIN: 9.4 g/dL — AB (ref 12.0–16.0)
Lymphocytes Relative: 10 %
Lymphs Abs: 0.6 10*3/uL — ABNORMAL LOW (ref 1.0–3.6)
MCH: 28.8 pg (ref 26.0–34.0)
MCHC: 34 g/dL (ref 32.0–36.0)
MCV: 84.7 fL (ref 80.0–100.0)
Monocytes Absolute: 0.4 10*3/uL (ref 0.2–0.9)
Monocytes Relative: 7 %
NEUTROS ABS: 5.1 10*3/uL (ref 1.4–6.5)
NEUTROS PCT: 82 %
Platelets: 376 10*3/uL (ref 150–440)
RBC: 3.27 MIL/uL — AB (ref 3.80–5.20)
RDW: 23.8 % — ABNORMAL HIGH (ref 11.5–14.5)
WBC: 6.1 10*3/uL (ref 3.6–11.0)

## 2017-06-13 LAB — COMPREHENSIVE METABOLIC PANEL
ALBUMIN: 3.3 g/dL — AB (ref 3.5–5.0)
ALK PHOS: 76 U/L (ref 38–126)
ALT: 20 U/L (ref 14–54)
AST: 16 U/L (ref 15–41)
Anion gap: 5 (ref 5–15)
BILIRUBIN TOTAL: 0.4 mg/dL (ref 0.3–1.2)
BUN: 14 mg/dL (ref 6–20)
CALCIUM: 9 mg/dL (ref 8.9–10.3)
CO2: 24 mmol/L (ref 22–32)
Chloride: 105 mmol/L (ref 101–111)
Creatinine, Ser: 0.75 mg/dL (ref 0.44–1.00)
GFR calc Af Amer: >60 mL/min (ref >60)
GFR calc non Af Amer: >60 mL/min (ref >60)
GLUCOSE: 128 mg/dL — AB (ref 65–99)
Potassium: 3.8 mmol/L (ref 3.5–5.1)
Sodium: 134 mmol/L — ABNORMAL LOW (ref 135–145)
TOTAL PROTEIN: 6.8 g/dL (ref 6.5–8.1)

## 2017-06-13 MED ORDER — DEXAMETHASONE SODIUM PHOSPHATE 10 MG/ML IJ SOLN
10.0000 mg | Freq: Once | INTRAMUSCULAR | Status: AC
  Administered 2017-06-13: 10 mg via INTRAVENOUS
  Filled 2017-06-13: qty 1

## 2017-06-13 MED ORDER — HEPARIN SOD (PORK) LOCK FLUSH 100 UNIT/ML IV SOLN
500.0000 [IU] | Freq: Once | INTRAVENOUS | Status: AC | PRN
  Administered 2017-06-13: 500 [IU]
  Filled 2017-06-13: qty 5

## 2017-06-13 MED ORDER — FAMOTIDINE IN NACL 20-0.9 MG/50ML-% IV SOLN
20.0000 mg | Freq: Once | INTRAVENOUS | Status: AC
  Administered 2017-06-13: 20 mg via INTRAVENOUS
  Filled 2017-06-13: qty 50

## 2017-06-13 MED ORDER — DIPHENHYDRAMINE HCL 50 MG/ML IJ SOLN
25.0000 mg | Freq: Once | INTRAMUSCULAR | Status: AC
  Administered 2017-06-13: 25 mg via INTRAVENOUS
  Filled 2017-06-13: qty 1

## 2017-06-13 MED ORDER — SODIUM CHLORIDE 0.9 % IV SOLN
72.0000 mg/m2 | Freq: Once | INTRAVENOUS | Status: AC
  Administered 2017-06-13: 168 mg via INTRAVENOUS
  Filled 2017-06-13: qty 28

## 2017-06-13 MED ORDER — SODIUM CHLORIDE 0.9 % IV SOLN
Freq: Once | INTRAVENOUS | Status: AC
  Administered 2017-06-13: 11:00:00 via INTRAVENOUS
  Filled 2017-06-13: qty 1000

## 2017-06-19 ENCOUNTER — Ambulatory Visit: Admission: RE | Admit: 2017-06-19 | Payer: Medicaid Other | Source: Ambulatory Visit

## 2017-06-19 ENCOUNTER — Ambulatory Visit
Admission: RE | Admit: 2017-06-19 | Discharge: 2017-06-19 | Disposition: A | Discharge status: Home / Self Care | Payer: Medicaid Other | Source: Ambulatory Visit | Attending: Oncology | Admitting: Oncology

## 2017-06-19 DIAGNOSIS — K5730 Diverticulosis of large intestine without perforation or abscess without bleeding: Secondary | ICD-10-CM | POA: Diagnosis not present

## 2017-06-19 DIAGNOSIS — Z170 Estrogen receptor positive status [ER+]: Secondary | ICD-10-CM | POA: Diagnosis present

## 2017-06-19 DIAGNOSIS — C50411 Malignant neoplasm of upper-outer quadrant of right female breast: Secondary | ICD-10-CM | POA: Diagnosis present

## 2017-06-19 MED ORDER — IOPAMIDOL (ISOVUE-370) INJECTION 76%
100.0000 mL | Freq: Once | INTRAVENOUS | Status: AC | PRN
  Administered 2017-06-19: 100 mL via INTRAVENOUS

## 2017-06-20 ENCOUNTER — Telehealth: Payer: Self-pay | Admitting: Nurse Practitioner

## 2017-06-20 ENCOUNTER — Inpatient Hospital Stay: Payer: Medicaid Other | Attending: Oncology

## 2017-06-20 ENCOUNTER — Inpatient Hospital Stay (HOSPITAL_BASED_OUTPATIENT_CLINIC_OR_DEPARTMENT_OTHER): Payer: Medicaid Other | Admitting: Oncology

## 2017-06-20 ENCOUNTER — Inpatient Hospital Stay: Payer: Medicaid Other

## 2017-06-20 VITALS — BP 117/83 | HR 84 | Temp 97.1°F | Resp 20 | Wt 245.7 lb

## 2017-06-20 DIAGNOSIS — M2550 Pain in unspecified joint: Secondary | ICD-10-CM | POA: Insufficient documentation

## 2017-06-20 DIAGNOSIS — F17210 Nicotine dependence, cigarettes, uncomplicated: Secondary | ICD-10-CM

## 2017-06-20 DIAGNOSIS — D649 Anemia, unspecified: Secondary | ICD-10-CM | POA: Diagnosis not present

## 2017-06-20 DIAGNOSIS — E669 Obesity, unspecified: Secondary | ICD-10-CM | POA: Diagnosis not present

## 2017-06-20 DIAGNOSIS — R1012 Left upper quadrant pain: Secondary | ICD-10-CM | POA: Insufficient documentation

## 2017-06-20 DIAGNOSIS — Z170 Estrogen receptor positive status [ER+]: Secondary | ICD-10-CM

## 2017-06-20 DIAGNOSIS — Z809 Family history of malignant neoplasm, unspecified: Secondary | ICD-10-CM | POA: Diagnosis not present

## 2017-06-20 DIAGNOSIS — C50411 Malignant neoplasm of upper-outer quadrant of right female breast: Secondary | ICD-10-CM | POA: Insufficient documentation

## 2017-06-20 DIAGNOSIS — I499 Cardiac arrhythmia, unspecified: Secondary | ICD-10-CM

## 2017-06-20 DIAGNOSIS — I10 Essential (primary) hypertension: Secondary | ICD-10-CM

## 2017-06-20 DIAGNOSIS — Z803 Family history of malignant neoplasm of breast: Secondary | ICD-10-CM | POA: Insufficient documentation

## 2017-06-20 DIAGNOSIS — M5136 Other intervertebral disc degeneration, lumbar region: Secondary | ICD-10-CM | POA: Diagnosis not present

## 2017-06-20 DIAGNOSIS — R1906 Epigastric swelling, mass or lump: Secondary | ICD-10-CM

## 2017-06-20 DIAGNOSIS — Z5111 Encounter for antineoplastic chemotherapy: Secondary | ICD-10-CM | POA: Diagnosis not present

## 2017-06-20 DIAGNOSIS — K5730 Diverticulosis of large intestine without perforation or abscess without bleeding: Secondary | ICD-10-CM

## 2017-06-20 DIAGNOSIS — G629 Polyneuropathy, unspecified: Secondary | ICD-10-CM

## 2017-06-20 DIAGNOSIS — N83201 Unspecified ovarian cyst, right side: Secondary | ICD-10-CM | POA: Insufficient documentation

## 2017-06-20 DIAGNOSIS — K651 Peritoneal abscess: Secondary | ICD-10-CM | POA: Diagnosis not present

## 2017-06-20 LAB — CBC WITH DIFFERENTIAL/PLATELET
Basophils Absolute: 0 10*3/uL (ref 0–0.1)
Basophils Relative: 0 %
EOS ABS: 0.1 10*3/uL (ref 0–0.7)
EOS PCT: 1 %
HCT: 27.9 % — ABNORMAL LOW (ref 35.0–47.0)
HEMOGLOBIN: 9.6 g/dL — AB (ref 12.0–16.0)
LYMPHS ABS: 0.6 10*3/uL — AB (ref 1.0–3.6)
Lymphocytes Relative: 10 %
MCH: 29.6 pg (ref 26.0–34.0)
MCHC: 34.3 g/dL (ref 32.0–36.0)
MCV: 86.2 fL (ref 80.0–100.0)
MONOS PCT: 7 %
Monocytes Absolute: 0.4 10*3/uL (ref 0.2–0.9)
NEUTROS PCT: 82 %
Neutro Abs: 4.8 10*3/uL (ref 1.4–6.5)
Platelets: 405 10*3/uL (ref 150–440)
RBC: 3.24 MIL/uL — ABNORMAL LOW (ref 3.80–5.20)
RDW: 23.5 % — ABNORMAL HIGH (ref 11.5–14.5)
WBC: 5.9 10*3/uL (ref 3.6–11.0)

## 2017-06-20 LAB — COMPREHENSIVE METABOLIC PANEL
ALBUMIN: 3.4 g/dL — AB (ref 3.5–5.0)
ALT: 18 U/L (ref 14–54)
ANION GAP: 6 (ref 5–15)
AST: 18 U/L (ref 15–41)
Alkaline Phosphatase: 74 U/L (ref 38–126)
BUN: 12 mg/dL (ref 6–20)
CHLORIDE: 104 mmol/L (ref 101–111)
CO2: 26 mmol/L (ref 22–32)
Calcium: 9.1 mg/dL (ref 8.9–10.3)
Creatinine, Ser: 0.75 mg/dL (ref 0.44–1.00)
GFR calc Af Amer: >60 mL/min (ref >60)
GFR calc non Af Amer: >60 mL/min (ref >60)
Glucose, Bld: 142 mg/dL — ABNORMAL HIGH (ref 65–99)
POTASSIUM: 3.7 mmol/L (ref 3.5–5.1)
SODIUM: 136 mmol/L (ref 135–145)
TOTAL PROTEIN: 6.9 g/dL (ref 6.5–8.1)
Total Bilirubin: 0.4 mg/dL (ref 0.3–1.2)

## 2017-06-20 MED ORDER — SODIUM CHLORIDE 0.9 % IV SOLN
72.0000 mg/m2 | Freq: Once | INTRAVENOUS | Status: AC
  Administered 2017-06-20: 168 mg via INTRAVENOUS
  Filled 2017-06-20: qty 28

## 2017-06-20 MED ORDER — DEXAMETHASONE SODIUM PHOSPHATE 10 MG/ML IJ SOLN
10.0000 mg | Freq: Once | INTRAMUSCULAR | Status: AC
  Administered 2017-06-20: 10 mg via INTRAVENOUS
  Filled 2017-06-20: qty 1

## 2017-06-20 MED ORDER — DIPHENHYDRAMINE HCL 50 MG/ML IJ SOLN
25.0000 mg | Freq: Once | INTRAMUSCULAR | Status: AC
  Administered 2017-06-20: 25 mg via INTRAVENOUS
  Filled 2017-06-20: qty 1

## 2017-06-20 MED ORDER — FAMOTIDINE IN NACL 20-0.9 MG/50ML-% IV SOLN
20.0000 mg | Freq: Once | INTRAVENOUS | Status: AC
  Administered 2017-06-20: 20 mg via INTRAVENOUS
  Filled 2017-06-20: qty 50

## 2017-06-20 MED ORDER — TRIAMTERENE-HCTZ 37.5-25 MG PO TABS: 1.0000 | ORAL_TABLET | Freq: Every day | ORAL | 1 refills | Status: DC

## 2017-06-20 MED ORDER — SODIUM CHLORIDE 0.9% FLUSH
10.0000 mL | INTRAVENOUS | Status: DC | PRN
  Administered 2017-06-20: 10 mL
  Filled 2017-06-20: qty 10

## 2017-06-20 MED ORDER — HEPARIN SOD (PORK) LOCK FLUSH 100 UNIT/ML IV SOLN
500.0000 [IU] | Freq: Once | INTRAVENOUS | Status: AC | PRN
  Administered 2017-06-20: 500 [IU]
  Filled 2017-06-20 (×2): qty 5

## 2017-06-20 MED ORDER — SODIUM CHLORIDE 0.9 % IV SOLN
Freq: Once | INTRAVENOUS | Status: AC
  Administered 2017-06-20: 11:00:00 via INTRAVENOUS
  Filled 2017-06-20: qty 1000

## 2017-06-20 NOTE — Progress Notes (Signed)
Patient had CT scan, continues to have abdominal pain.

## 2017-06-20 NOTE — Progress Notes (Signed)
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  INTERVAL HISTORY: Patient returns to clinic today for further evaluation and consideration of cycle 6 of 12 of weekly Taxol. She does not complain of joint pain today. Her neuropathy is better this week. She admits to continued left upper and lower abdominal pain that started about 1 month ago. She does not have to take any pain medication for this. She had a CT of the abdomen last week and is here for results. She has no other neurologic complaints. She denies any recent fevers or illnesses. She has a good appetite and denies weight loss. She has no chest pain or shortness of breath. She denies any nausea, vomiting, constipation, or diarrhea. She has no urinary complaints. Patient offers no further specific complaints today.  REVIEW OF SYSTEMS:   Review of Systems  Constitutional: Negative.  Negative for fever, malaise/fatigue and weight loss.  HENT: Negative.  Negative for congestion.   Respiratory: Negative.  Negative for cough and shortness of breath.   Cardiovascular: Negative.  Negative for chest pain and leg swelling.  Gastrointestinal: Positive for abdominal pain.       Left upper and lower quadrants.   Genitourinary: Negative.   Skin: Negative.  Negative for rash.  Neurological: Positive for sensory change. Negative for weakness.  Psychiatric/Behavioral: Negative.  The patient is not nervous/anxious.     As per HPI. Otherwise, a complete review of systems is negative.  PAST MEDICAL HISTORY: Past  Medical History:  Diagnosis Date  . Arthritis    SHOULDER  . Cancer ([place]) 02/28/2017   INVASIVE MAMMARY CARCINOMA WITH MUCINOUS FEATURES.  . Hypertension   . Irregular heart beat    PT STATES IT "SKIPS A BEAT"   . Obesity     PAST SURGICAL HISTORY: Past Surgical History:  Procedure Laterality Date  . ANKLE SURGERY    . BREAST BIOPSY Right 02/28/2017   INVASIVE MAMMARY CARCINOMA WITH MUCINOUS FEATURES.  Marland Kitchen BREAST CYST ASPIRATION Right    NEG  . PORTACATH PLACEMENT Left 03/15/2017   Procedure: INSERTION PORT-A-CATH;  Surgeon: Robert Bellow, MD;  Location: [facility] ORS;  Service: General;  Laterality: Left;    FAMILY HISTORY: Family History  Problem Relation Age of Onset  . Diabetes Father   . Stroke Father   . Breast cancer Mother 64  . Brain cancer Maternal Aunt 60  . Colon cancer Neg Hx     ADVANCED DIRECTIVES (Y/N):  N  HEALTH MAINTENANCE: Social History  Substance Use Topics  . Smoking status: Current Every Day Smoker    Packs/day: 0.25    Years: 25.00    Types: Cigarettes  . Smokeless tobacco: Never Used  . Alcohol use Yes     Comment: occas     Colonoscopy:  PAP:  Bone density:  Lipid panel:  Allergies  Allergen Reactions  . Other Hives and Itching    [name]     Current Outpatient Prescriptions  Medication Sig Dispense Refill  .  baclofen (LIORESAL) 10 MG tablet Take 1 tablet (10 mg total) by mouth 3 (three) times daily. 30 each 0  . lidocaine-prilocaine (EMLA) cream Apply to affected area once 30 g 3  . ondansetron (ZOFRAN) 8 MG tablet Take 1 tablet (8 mg total) by mouth 2 (two) times daily as needed. 60 tablet 3  . prochlorperazine (COMPAZINE) 10 MG tablet Take 1 tablet (10 mg total) by mouth every 6 (six) hours as needed (Nausea or vomiting). 60 tablet 3  . magic mouthwash w/lidocaine SOLN Take 5 mLs by mouth 4 (four) times daily as needed for  mouth pain. (Patient not taking: Reported on 06/06/2017) 480 mL 3  . triamterene-hydrochlorothiazide (MAXZIDE-25) 37.5-25 MG tablet Take 1 tablet by mouth daily. 90 tablet 1   No current facility-administered medications for this visit.     OBJECTIVE: Vitals:   06/20/17 1031  BP: 117/83  Pulse: 84  Resp: 20  Temp: (!) 97.1 F (36.2 C)     Body mass index is 38.48 kg/m.    ECOG FS:0 - Asymptomatic  General: Well-developed, well-nourished, no acute distress. Eyes: Pink conjunctiva, anicteric sclera. Breasts: Patient requested exam be deferred today. Lungs: Clear to auscultation bilaterally. Heart: Regular rate and rhythm. No rubs, murmurs, or gallops. Abdomen: Soft, tender, non-distended left upper and lower quadrant 6/10 pain with BM. No organomegaly noted, normoactive bowel sounds. Musculoskeletal: No edema, cyanosis, or clubbing. Neuro: Alert, answering all questions appropriately. Cranial nerves grossly intact. Skin: No rashes or petechiae noted. Psych: Normal affect.  LAB RESULTS:  Lab Results  Component Value Date   NA 136 06/20/2017   K 3.7 06/20/2017   CL 104 06/20/2017   CO2 26 06/20/2017   GLUCOSE 142 (H) 06/20/2017   BUN 12 06/20/2017   CREATININE 0.75 06/20/2017   CALCIUM 9.1 06/20/2017   PROT 6.9 06/20/2017   ALBUMIN 3.4 (L) 06/20/2017   AST 18 06/20/2017   ALT 18 06/20/2017   ALKPHOS 74 06/20/2017   BILITOT 0.4 06/20/2017   GFRNONAA >60 06/20/2017   GFRAA >60 06/20/2017    Lab Results  Component Value Date   WBC 5.9 06/20/2017   NEUTROABS 4.8 06/20/2017   HGB 9.6 (L) 06/20/2017   HCT 27.9 (L) 06/20/2017   MCV 86.2 06/20/2017   PLT 405 06/20/2017     STUDIES: Ct Abdomen Pelvis W Contrast  Result Date: 06/19/2017 CLINICAL DATA:  Recent diagnosis of right upper outer quadrant breast cancer for which the patient is undergoing neoadjuvant chemotherapy. Patient presents with approximate 1 month history of left upper quadrant and left lower quadrant  abdominal pain. EXAM: CT ABDOMEN AND PELVIS WITH CONTRAST TECHNIQUE: Multidetector CT imaging of the abdomen and pelvis was performed using the standard protocol following bolus administration of intravenous contrast. CONTRAST:  100 mL Isovue 370 IV. COMPARISON:  PET-CT 03/09/2017.  CT abdomen and pelvis 05/02/2012. FINDINGS: Lower chest: Heart size normal.  Visualized lung bases clear. Hepatobiliary: Liver normal in size and appearance. Gallbladder normal in appearance without calcified gallstones. No biliary ductal dilation. Pancreas: Normal in appearance without evidence of mass, ductal dilation, or inflammation. Spleen: Normal in size and appearance. Adrenals/Urinary Tract: Normal appearing adrenal glands. Kidneys normal in size and appearance without focal parenchymal abnormality. No evidence of urinary tract calculi or obstruction. Normal-appearing urinary bladder. Stomach/Bowel: Stomach normal in appearance for the degree of distention. Normal-appearing small bowel. Descending and sigmoid colon diverticulosis with scattered diverticula in the transverse and ascending colon. Circumferential enhancing mass involving the proximal sigmoid colon extending  over an approximate 6 cm length. Posterior to the mass is a thick walled fluid collection measuring approximately 2.4 x 2.4 x 3.1 cm. Mild edema/inflammation is present in the surrounding fat. Normal-appearing appendix in the right upper pelvis. Vascular/Lymphatic: Mild bilateral common iliac artery atherosclerosis. No visible aortic atherosclerosis. Normal-appearing portal venous and systemic venous systems. While normal in size, a lymph node in the left external iliac chain which measures approximately 1.0 x 0.8 cm is larger than the contralateral external iliac nodes; however, the node did not demonstrate hypermetabolic activity on the prior PET-CT. Scattered nodes are present in the fat adjacent to the sigmoid colon. No pathologic lymphadenopathy elsewhere in  the abdomen or pelvis. Reproductive: Normal appearing uterus. Approximate 2.5 cm right ovarian cyst. No significant adnexal masses. Other: No ascites. Musculoskeletal: Degenerative disc disease at L3-4 and L4-5. Facet degenerative changes at L4-5 and L5-S1. Lower thoracic spondylosis. No acute osseous abnormality. No evidence of osseous metastatic disease. IMPRESSION: 1. Circumferential enhancing mass involving the proximal sigmoid colon extending over an approximate 6 cm length. The patient has sigmoid diverticulosis, and there is an approximate 3 cm thick walled abscess adjacent to the colonic mass. However, I believe this is more likely to represent a sigmoid colon malignancy with prior microperforation and development of a walled off abscess (rather than the sequelae of diverticulitis). 2. No evidence of metastatic disease in the abdomen or pelvis. 3. No evidence of osseous metastatic disease. Electronically Signed   By: Evangeline Dakin M.D.   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  PLAN:   1. 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 Pretreatment MUGA revealed an EF of 59%. PET scan results reviewed independently with no obvious metastatic disease, but FDG positive area and her sigmoid colon. After the completion of her neoadjuvant chemotherapy, she will require definitive surgery and possibly adjuvant XRT. Finally, given the ER/PR status of her tumor she will benefit from either tamoxifen or an aromatase inhibitor for total 5 years. Patient completed 4 cycles of Adriamycin and Cytoxan. Proceed with cycle 6 of 12 of  weekly Taxol. Continue dose reduced Taxol 10% given her peripheral neuropathy. Return to clinic in 1 week for consideration of cycle 7 and then in 2 weeks for further evaluation and consideration of cycle 8. 2. Left upper and lower abdominal pain: Previous PET in April 2018 showed possible diverticulitis. Recent CT scan revealed circumferential enhancing mass involving the proximal sigmoid colon extending over an approximate 6 cm length. The patient has sigmoid diverticulosis, and there is an approximate 3 cm thick walled abscess adjacent to the colonic mass. This is likely representative of a sigmoid colon malignancy with prior microperforation and development of a walled off abscess. Her scan has been added to case conference for tomorrow.  4. Anemia: Holding stable. Hemoglobin 9.6 today. 5. Joint pain: Improved. Patient does not complain of pain today. 6. Peripheral neuropathy: Dose reduce Taxol as above. Fingers only.   Patient expressed understanding and was in agreement with this plan. She also understands that She can call clinic at any time with any questions, concerns, or complaints.   Cancer Staging Malignant neoplasm of upper-outer  quadrant of right breast in female, estrogen receptor positive ([place]) Staging form: Breast, AJCC 8th Edition - Clinical stage from 03/05/2017: Stage IIA (cT2, cN2a, cM0, G2, ER: Positive, PR: Positive, HER2: Negative) - Signed by Lloyd Huger, MD on 03/19/2017   Jacquelin Hawking, NP   06/21/2017 8:13 AM

## 2017-06-20 NOTE — Telephone Encounter (Signed)
Pt needs a refill on BP medication sent to [facility].  Her call back number is [phone]

## 2017-06-21 ENCOUNTER — Other Ambulatory Visit: Payer: Self-pay

## 2017-06-21 ENCOUNTER — Telehealth: Payer: Self-pay

## 2017-06-21 DIAGNOSIS — K6389 Other specified diseases of intestine: Secondary | ICD-10-CM | POA: Insufficient documentation

## 2017-06-21 DIAGNOSIS — K5720 Diverticulitis of large intestine with perforation and abscess without bleeding: Secondary | ICD-10-CM

## 2017-06-21 HISTORY — DX: Diverticulitis of large intestine with perforation and abscess without bleeding: K57.20

## 2017-06-21 MED ORDER — PEG 3350-KCL-NA BICARB-NACL 420 G PO SOLR
4000.0000 mL | Freq: Once | ORAL | 0 refills | Status: AC
Start: 2017-06-21 — End: 2017-06-21

## 2017-06-21 NOTE — Telephone Encounter (Signed)
  Oncology Nurse Navigator Documentation Case presented today at tumor board. Recommendations are to hold chemotherapy and proceed with colon work up in regards to CT scan performed. Called and notified Chelsea Hudson that this mass in her colon could represent a separate colon cancer and that it was recommended to hold her chemotherapy for now and work this up. Specifically we will arrange for a colonoscopy for luminal evaluation and biopsy. Educated regarding colonoscopy and prep. [facility] will provide further education regarding. I have contacted Ginger at [facility] and she will assist with expediting colonoscopy. Navigator Location: [facility] (06/21/17 1400)   )Navigator Encounter Type: Telephone;Diagnostic Results (06/21/17 1400) Telephone: Outgoing Call;Diagnostic Results (06/21/17 1400)                                                  Time Spent with Patient: 60 (06/21/17 1400)

## 2017-06-25 ENCOUNTER — Ambulatory Visit: Payer: Medicaid Other | Admitting: Anesthesiology

## 2017-06-25 ENCOUNTER — Encounter
Admission: RE | Disposition: A | Discharge status: Home / Self Care | Payer: Self-pay | Source: Ambulatory Visit | Attending: Gastroenterology

## 2017-06-25 ENCOUNTER — Other Ambulatory Visit: Payer: Self-pay | Admitting: Nurse Practitioner

## 2017-06-25 ENCOUNTER — Ambulatory Visit
Admission: RE | Admit: 2017-06-25 | Discharge: 2017-06-25 | Disposition: A | Discharge status: Home / Self Care | Payer: Medicaid Other | Source: Ambulatory Visit | Attending: Gastroenterology | Admitting: Gastroenterology

## 2017-06-25 DIAGNOSIS — E669 Obesity, unspecified: Secondary | ICD-10-CM | POA: Insufficient documentation

## 2017-06-25 DIAGNOSIS — K5732 Diverticulitis of large intestine without perforation or abscess without bleeding: Secondary | ICD-10-CM

## 2017-06-25 DIAGNOSIS — K56600 Partial intestinal obstruction, unspecified as to cause: Secondary | ICD-10-CM | POA: Insufficient documentation

## 2017-06-25 DIAGNOSIS — K5730 Diverticulosis of large intestine without perforation or abscess without bleeding: Secondary | ICD-10-CM | POA: Insufficient documentation

## 2017-06-25 DIAGNOSIS — K529 Noninfective gastroenteritis and colitis, unspecified: Secondary | ICD-10-CM | POA: Insufficient documentation

## 2017-06-25 DIAGNOSIS — K6389 Other specified diseases of intestine: Secondary | ICD-10-CM | POA: Insufficient documentation

## 2017-06-25 DIAGNOSIS — F17210 Nicotine dependence, cigarettes, uncomplicated: Secondary | ICD-10-CM | POA: Insufficient documentation

## 2017-06-25 DIAGNOSIS — Z6839 Body mass index (BMI) 39.0-39.9, adult: Secondary | ICD-10-CM | POA: Insufficient documentation

## 2017-06-25 DIAGNOSIS — I10 Essential (primary) hypertension: Secondary | ICD-10-CM | POA: Insufficient documentation

## 2017-06-25 DIAGNOSIS — Z853 Personal history of malignant neoplasm of breast: Secondary | ICD-10-CM | POA: Diagnosis not present

## 2017-06-25 DIAGNOSIS — Z79899 Other long term (current) drug therapy: Secondary | ICD-10-CM | POA: Insufficient documentation

## 2017-06-25 DIAGNOSIS — R933 Abnormal findings on diagnostic imaging of other parts of digestive tract: Secondary | ICD-10-CM | POA: Insufficient documentation

## 2017-06-25 HISTORY — PX: COLONOSCOPY WITH PROPOFOL: SHX5780

## 2017-06-25 SURGERY — COLONOSCOPY WITH PROPOFOL
Anesthesia: General

## 2017-06-25 MED ORDER — LIDOCAINE HCL (PF) 1 % IJ SOLN
INTRAMUSCULAR | Status: AC
  Administered 2017-06-25: 0.3 mL via INTRADERMAL
  Filled 2017-06-25: qty 2

## 2017-06-25 MED ORDER — LIDOCAINE HCL (PF) 1 % IJ SOLN
2.0000 mL | Freq: Once | INTRAMUSCULAR | Status: AC
  Administered 2017-06-25: 0.3 mL via INTRADERMAL

## 2017-06-25 MED ORDER — MIDAZOLAM HCL 2 MG/2ML IJ SOLN
INTRAMUSCULAR | Status: DC | PRN
  Administered 2017-06-25: 2 mg via INTRAVENOUS

## 2017-06-25 MED ORDER — LIDOCAINE HCL (PF) 2 % IJ SOLN
INTRAMUSCULAR | Status: AC
  Filled 2017-06-25: qty 2

## 2017-06-25 MED ORDER — PROPOFOL 500 MG/50ML IV EMUL
INTRAVENOUS | Status: DC | PRN
  Administered 2017-06-25: 25 ug/kg/min via INTRAVENOUS

## 2017-06-25 MED ORDER — CIPROFLOXACIN HCL 500 MG PO TABS: 500.0000 mg | ORAL_TABLET | Freq: Two times a day (BID) | ORAL | 0 refills | Status: DC

## 2017-06-25 MED ORDER — SODIUM CHLORIDE 0.9 % IV SOLN
INTRAVENOUS | Status: DC
  Administered 2017-06-25: 1000 mL via INTRAVENOUS
  Administered 2017-06-25: 08:00:00 via INTRAVENOUS

## 2017-06-25 MED ORDER — PROPOFOL 500 MG/50ML IV EMUL
INTRAVENOUS | Status: AC
  Filled 2017-06-25: qty 50

## 2017-06-25 MED ORDER — METRONIDAZOLE 500 MG PO TABS: 500.0000 mg | ORAL_TABLET | Freq: Three times a day (TID) | ORAL | 0 refills | Status: AC

## 2017-06-25 MED ORDER — MIDAZOLAM HCL 2 MG/2ML IJ SOLN
INTRAMUSCULAR | Status: AC
  Filled 2017-06-25: qty 2

## 2017-06-25 MED ORDER — PROPOFOL 10 MG/ML IV BOLUS
INTRAVENOUS | Status: DC | PRN
  Administered 2017-06-25: 30 mg via INTRAVENOUS

## 2017-06-25 MED ORDER — LIDOCAINE HCL (CARDIAC) 20 MG/ML IV SOLN
INTRAVENOUS | Status: DC | PRN
  Administered 2017-06-25: 100 mg via INTRAVENOUS

## 2017-06-25 NOTE — Progress Notes (Addendum)
Pt presenting w/ abdominal pain.  Has had workup as follows: 1. On colonoscopy: - no findings of mass - mucosal edema and vascular congestion w/ diverticulosis in sigmoid colon 2. On CT scan: - Pericolonic abscess (possible diverticulitis w/ perforation and abscess)  Plan:  - 10 day course of ciprofloxacin 500 mg bid and metronidazole 500 mg tid.   - Repeat CT abdomen 3 weeks. - If persists, recommend repeat followup w/ Dr. Bailey Mech for consideration of repeat sigmoidoscopy.  Appreciate the multidisciplinary input so far from Faythe Casa, NP, Dr. Grayland Ormond and Dr. Bailey Mech.  Feel free to reach out to me as PCP as needed.  Pt and her husband were both notified about our plan of care via telephone.

## 2017-06-25 NOTE — H&P (Signed)
Jonathon Bellows MD [address], [place], [place] [address] Phone: [phone] Fax : [phone]  Primary Care Physician:  Mikey College, NP Primary Gastroenterologist:  Dr. Jonathon Bellows   Pre-Procedure History & Physical: HPI:  Chelsea Hudson is a 51 y.o. female is here for an colonoscopy.   Past Medical History:  Diagnosis Date  . Arthritis    SHOULDER  . Cancer ([place]) 02/28/2017   INVASIVE MAMMARY CARCINOMA WITH MUCINOUS FEATURES.  . Colonic mass 06/21/2017  . Hypertension   . Irregular heart beat    PT STATES IT "SKIPS A BEAT"   . Obesity     Past Surgical History:  Procedure Laterality Date  . ANKLE SURGERY    . BREAST BIOPSY Right 02/28/2017   INVASIVE MAMMARY CARCINOMA WITH MUCINOUS FEATURES.  Marland Kitchen BREAST CYST ASPIRATION Right    NEG  . PORTACATH PLACEMENT Left 03/15/2017   Procedure: INSERTION PORT-A-CATH;  Surgeon: Robert Bellow, MD;  Location: [facility] ORS;  Service: General;  Laterality: Left;    Prior to Admission medications   Medication Sig Start Date End Date Taking? Authorizing Provider  baclofen (LIORESAL) 10 MG tablet Take 1 tablet (10 mg total) by mouth 3 (three) times daily. 06/06/17   Mikey College, NP  lidocaine-prilocaine (EMLA) cream Apply to affected area once 03/20/17   Lloyd Huger, MD  magic mouthwash w/lidocaine SOLN Take 5 mLs by mouth 4 (four) times daily as needed for mouth pain. Patient not taking: Reported on 06/06/2017 04/26/17   Lloyd Huger, MD  ondansetron (ZOFRAN) 8 MG tablet Take 1 tablet (8 mg total) by mouth 2 (two) times daily as needed. 03/20/17   Lloyd Huger, MD  prochlorperazine (COMPAZINE) 10 MG tablet Take 1 tablet (10 mg total) by mouth every 6 (six) hours as needed (Nausea or vomiting). 03/20/17   Lloyd Huger, MD  triamterene-hydrochlorothiazide (MAXZIDE-25) 37.5-25 MG tablet Take 1 tablet by mouth daily. 06/20/17   Mikey College, NP    Allergies as of 06/21/2017 - Review Complete  06/19/2017  Allergen Reaction Noted  . Other Hives and Itching 03/08/2017    Family History  Problem Relation Age of Onset  . Diabetes Father   . Stroke Father   . Breast cancer Mother 45  . Brain cancer Maternal Aunt 60  . Colon cancer Neg Hx     Social History   Social History  . Marital status: Married    Spouse name: N/A  . Number of children: N/A  . Years of education: N/A   Occupational History  . Not on file.   Social History Main Topics  . Smoking status: Current Every Day Smoker    Packs/day: 0.25    Years: 25.00    Types: Cigarettes  . Smokeless tobacco: Never Used  . Alcohol use Yes     Comment: occas  . Drug use: No  . Sexual activity: Yes   Other Topics Concern  . Not on file   Social History Narrative  . No narrative on file    Review of Systems: See HPI, otherwise negative ROS  Physical Exam: BP (!) 142/81   Pulse 100   Temp 98.2 F (36.8 C) (Tympanic)   Resp 20   Ht 5\' 6"  (1.676 m)   Wt 245 lb (111.1 kg)   LMP 01/31/2014 (Approximate) Comment: LMP was 3 years ago.  SpO2 100%   BMI 39.54 kg/m  General:   Alert,  pleasant and cooperative in  NAD Head:  Normocephalic and atraumatic. Neck:  Supple; no masses or thyromegaly. Lungs:  Clear throughout to auscultation.    Heart:  Regular rate and rhythm. Abdomen:  Soft, nontender and nondistended. Normal bowel sounds, without guarding, and without rebound.   Neurologic:  Alert and  oriented x4;  grossly normal neurologically.  Impression/Plan: Chelsea Hudson is here for an colonoscopy to be performed for abnormality seen on CT scan.   Risks, benefits, limitations, and alternatives regarding  colonoscopy have been reviewed with the patient.  Questions have been answered.  All parties agreeable.   Jonathon Bellows, MD  06/25/2017, 8:08 AM

## 2017-06-25 NOTE — Transfer of Care (Signed)
Immediate Anesthesia Transfer of Care Note  Patient: Chelsea Hudson  Procedure(s) Performed: Procedure(s): COLONOSCOPY WITH PROPOFOL (N/A)  Patient Location: Endoscopy Unit  Anesthesia Type:General  Level of Consciousness: awake, oriented and patient cooperative  Airway & Oxygen Therapy: Patient Spontanous Breathing and Patient connected to nasal cannula oxygen  Post-op Assessment: Report given to RN, Post -op Vital signs reviewed and stable and Patient moving all extremities X 4  Post vital signs: Reviewed and stable  Last Vitals:  Vitals:   06/25/17 0756  BP: (!) 142/81  Pulse: 100  Resp: 20  Temp: 36.8 C    Last Pain:  Vitals:   06/25/17 0756  TempSrc: Tympanic  PainSc: 8          Complications: No apparent anesthesia complications

## 2017-06-25 NOTE — Anesthesia Post-op Follow-up Note (Cosign Needed)
Anesthesia QCDR form completed.        

## 2017-06-25 NOTE — Anesthesia Postprocedure Evaluation (Signed)
Anesthesia Post Note  Patient: Chelsea Hudson  Procedure(s) Performed: Procedure(s) (LRB): COLONOSCOPY WITH PROPOFOL (N/A)  Patient location during evaluation: Endoscopy Anesthesia Type: General Level of consciousness: awake and alert Pain management: pain level controlled Vital Signs Assessment: post-procedure vital signs reviewed and stable Respiratory status: spontaneous breathing, nonlabored ventilation, respiratory function stable and patient connected to nasal cannula oxygen Cardiovascular status: blood pressure returned to baseline and stable Postop Assessment: no signs of nausea or vomiting Anesthetic complications: no     Last Vitals:  Vitals:   06/25/17 0908 06/25/17 0918  BP: 129/82 130/83  Pulse: 94 85  Resp: 18 20  Temp:      Last Pain:  Vitals:   06/25/17 0848  TempSrc: Tympanic  PainSc:                  Precious Haws [place]

## 2017-06-25 NOTE — Op Note (Signed)
[facility] Gastroenterology Patient Name: Chelsea Hudson Procedure Date: 06/25/2017 7:28 AM MRN: [id number] Account #: [id number] Date of Birth: [birth year] Admit Type: Outpatient Age: 51 Room: [facility] ENDO ROOM 4 Gender: Female Note Status: Finalized Procedure:            Colonoscopy Indications:          Abnormal CT of the GI tract Providers:            Jonathon Bellows MD, MD Medicines:            Monitored Anesthesia Care Complications:        No immediate complications. Procedure:            Pre-Anesthesia Assessment:                       - Prior to the procedure, a History and Physical was                        performed, and patient medications, allergies and                        sensitivities were reviewed. The patient's tolerance of                        previous anesthesia was reviewed.                       - The risks and benefits of the procedure and the                        sedation options and risks were discussed with the                        patient. All questions were answered and informed                        consent was obtained.                       - ASA Grade Assessment: III - A patient with severe                        systemic disease.                       After obtaining informed consent, the colonoscope was                        passed under direct vision. Throughout the procedure,                        the patient's blood pressure, pulse, and oxygen                        saturations were monitored continuously. The                        Colonoscope was introduced through the anus and                        advanced to the the cecum, identified by the  appendiceal orifice, IC valve and transillumination.                        The colonoscopy was performed with ease. The patient                        tolerated the procedure well. The quality of the bowel                        preparation was  adequate. Findings:      The perianal and digital rectal examinations were normal.      Multiple small-mouthed diverticula were found in the sigmoid colon.      Localized mild inflammation characterized by congestion (edema) and       erythema was found in the sigmoid colon. Biopsies were taken with a cold       forceps for histology.      A benign-appearing, intrinsic mild stenosis measuring 4 cm (in length) x       1.5 cm (inner diameter) was found in the sigmoid colon and was traversed       easily, I repeatedly went back and forth in this area and the mucosa       appeared normal but edematous.      No suspicious areas noted. This area also had diverticulosis . Impression:           - Diverticulosis in the sigmoid colon.                       - Localized mild inflammation was found in the sigmoid                        colon secondary to colitis. Biopsied.                       - Stricture in the sigmoid colon. Recommendation:       - Discharge patient to home (with escort).                       - Advance diet as tolerated.                       - Continue present medications.                       - 1. I did not see any mass in the area corresponding                        to the CT scan findings.                       2. I did notice the lumen was mildly narrowed with                        mucosal edema which may suggest she actually had                        diverticulitis with subsequent peri colonic abscess                        that still persists on the CT scan .  No luminal mass                        noted.                       3. Suggest to consider treatment of the abscess with                        antibiotics for a few weeks and then repeat CT scan to                        check for resolution of the abscess and sigmoid                        abnormality. If persists then would repeat                        sigmoidoscopy today . I used very mininal sedation due                         to presence of abscess and minimal air insuffulation to                        reduce risk of perforation.                       4. NO NSAID's Procedure Code(s):    --- Professional ---                       [phone], Colonoscopy, flexible; with biopsy, single or                        multiple Diagnosis Code(s):    --- Professional ---                       K52.9, Noninfective gastroenteritis and colitis,                        unspecified                       K56.69, Other intestinal obstruction                       K57.30, Diverticulosis of large intestine without                        perforation or abscess without bleeding                       R93.3, Abnormal findings on diagnostic imaging of other                        parts of digestive tract CPT copyright 2016 [employer]. All rights reserved. The codes documented in this report are preliminary and upon coder review may  be revised to meet current compliance requirements. Jonathon Bellows, MD Jonathon Bellows MD, MD 06/25/2017 8:49:30 AM This report has been signed electronically. Number of Addenda: 0 Note Initiated On: 06/25/2017 7:28 AM Scope Withdrawal Time: 0 hours 9 minutes 56 seconds  Total Procedure Duration: 0 hours 16  minutes 16 seconds       [facility]

## 2017-06-25 NOTE — Anesthesia Preprocedure Evaluation (Signed)
Anesthesia Evaluation  Patient identified by MRN, date of birth, ID band Patient awake    Reviewed: Allergy & Precautions, H&P , NPO status , Patient's Chart, lab work & pertinent test results  History of Anesthesia Complications Negative for: history of anesthetic complications  Airway Mallampati: III  TM Distance: >3 FB Neck ROM: limited    Dental  (+) Poor Dentition, Chipped, Missing   Pulmonary neg shortness of breath, Current Smoker,           Cardiovascular Exercise Tolerance: Good hypertension, (-) angina(-) Past MI and (-) DOE      Neuro/Psych negative neurological ROS  negative psych ROS   GI/Hepatic negative GI ROS, Neg liver ROS, neg GERD  ,  Endo/Other  negative endocrine ROS  Renal/GU negative Renal ROS  negative genitourinary   Musculoskeletal  (+) Arthritis ,   Abdominal   Peds  Hematology negative hematology ROS (+)   Anesthesia Other Findings Signs and symptoms suggestive of sleep apnea   Past Medical History: No date: Arthritis     Comment:  SHOULDER 02/28/2017: Cancer ([place])     Comment:  INVASIVE MAMMARY CARCINOMA WITH MUCINOUS FEATURES. 06/21/2017: Colonic mass No date: Hypertension No date: Irregular heart beat     Comment:  PT STATES IT "SKIPS A BEAT"  No date: Obesity  Past Surgical History: No date: ANKLE SURGERY 02/28/2017: BREAST BIOPSY; Right     Comment:  INVASIVE MAMMARY CARCINOMA WITH MUCINOUS FEATURES. No date: BREAST CYST ASPIRATION; Right     Comment:  NEG 03/15/2017: PORTACATH PLACEMENT; Left     Comment:  Procedure: INSERTION PORT-A-CATH;  Surgeon: Robert Bellow, MD;  Location: [facility] ORS;  Service: General;                Laterality: Left;     Reproductive/Obstetrics negative OB ROS                             Anesthesia Physical Anesthesia Plan  ASA: III  Anesthesia Plan: General   Post-op Pain Management:     Induction: Intravenous  PONV Risk Score and Plan:   Airway Management Planned: Natural Airway and Nasal Cannula  Additional Equipment:   Intra-op Plan:   Post-operative Plan:   Informed Consent: I have reviewed the patients History and Physical, chart, labs and discussed the procedure including the risks, benefits and alternatives for the proposed anesthesia with the patient or authorized representative who has indicated his/her understanding and acceptance.   Dental Advisory Given  Plan Discussed with: Anesthesiologist, CRNA and Surgeon  Anesthesia Plan Comments: (Patient consented for risks of anesthesia including but not limited to:  - adverse reactions to medications - risk of intubation if required - damage to teeth, lips or other oral mucosa - sore throat or hoarseness - Damage to heart, brain, lungs or loss of life  Patient voiced understanding.)        Anesthesia Quick Evaluation

## 2017-06-26 ENCOUNTER — Other Ambulatory Visit: Payer: Self-pay | Admitting: Nurse Practitioner

## 2017-06-26 ENCOUNTER — Encounter: Payer: Self-pay | Admitting: Gastroenterology

## 2017-06-26 DIAGNOSIS — K5732 Diverticulitis of large intestine without perforation or abscess without bleeding: Secondary | ICD-10-CM

## 2017-06-26 LAB — SURGICAL PATHOLOGY

## 2017-06-26 NOTE — Addendum Note (Signed)
Addended by: Cleaster Corin on: 06/26/2017 02:21 PM   Modules accepted: Orders

## 2017-06-27 ENCOUNTER — Inpatient Hospital Stay: Payer: Medicaid Other | Admitting: *Deleted

## 2017-06-27 ENCOUNTER — Inpatient Hospital Stay: Payer: Medicaid Other

## 2017-06-27 ENCOUNTER — Inpatient Hospital Stay (HOSPITAL_BASED_OUTPATIENT_CLINIC_OR_DEPARTMENT_OTHER): Payer: Medicaid Other | Admitting: Oncology

## 2017-06-27 ENCOUNTER — Encounter: Payer: Self-pay | Admitting: Oncology

## 2017-06-27 VITALS — BP 132/84 | HR 84 | Temp 97.2°F | Wt 243.9 lb

## 2017-06-27 DIAGNOSIS — C50411 Malignant neoplasm of upper-outer quadrant of right female breast: Secondary | ICD-10-CM

## 2017-06-27 DIAGNOSIS — Z803 Family history of malignant neoplasm of breast: Secondary | ICD-10-CM

## 2017-06-27 DIAGNOSIS — K5730 Diverticulosis of large intestine without perforation or abscess without bleeding: Secondary | ICD-10-CM | POA: Diagnosis not present

## 2017-06-27 DIAGNOSIS — Z170 Estrogen receptor positive status [ER+]: Principal | ICD-10-CM

## 2017-06-27 DIAGNOSIS — M2550 Pain in unspecified joint: Secondary | ICD-10-CM | POA: Diagnosis not present

## 2017-06-27 DIAGNOSIS — I499 Cardiac arrhythmia, unspecified: Secondary | ICD-10-CM

## 2017-06-27 DIAGNOSIS — F17210 Nicotine dependence, cigarettes, uncomplicated: Secondary | ICD-10-CM

## 2017-06-27 DIAGNOSIS — R1012 Left upper quadrant pain: Secondary | ICD-10-CM | POA: Diagnosis not present

## 2017-06-27 DIAGNOSIS — G629 Polyneuropathy, unspecified: Secondary | ICD-10-CM

## 2017-06-27 DIAGNOSIS — R1906 Epigastric swelling, mass or lump: Secondary | ICD-10-CM

## 2017-06-27 DIAGNOSIS — N83201 Unspecified ovarian cyst, right side: Secondary | ICD-10-CM | POA: Diagnosis not present

## 2017-06-27 DIAGNOSIS — Z809 Family history of malignant neoplasm, unspecified: Secondary | ICD-10-CM

## 2017-06-27 DIAGNOSIS — K651 Peritoneal abscess: Secondary | ICD-10-CM | POA: Diagnosis not present

## 2017-06-27 DIAGNOSIS — Z5111 Encounter for antineoplastic chemotherapy: Secondary | ICD-10-CM | POA: Diagnosis not present

## 2017-06-27 DIAGNOSIS — I10 Essential (primary) hypertension: Secondary | ICD-10-CM | POA: Diagnosis not present

## 2017-06-27 DIAGNOSIS — M5136 Other intervertebral disc degeneration, lumbar region: Secondary | ICD-10-CM

## 2017-06-27 DIAGNOSIS — E669 Obesity, unspecified: Secondary | ICD-10-CM

## 2017-06-27 DIAGNOSIS — D649 Anemia, unspecified: Secondary | ICD-10-CM

## 2017-06-27 LAB — CBC WITH DIFFERENTIAL/PLATELET
BASOS ABS: 0 10*3/uL (ref 0–0.1)
Basophils Relative: 0 %
Eosinophils Absolute: 0.1 10*3/uL (ref 0–0.7)
Eosinophils Relative: 2 %
HEMATOCRIT: 27.9 % — AB (ref 35.0–47.0)
HEMOGLOBIN: 9.6 g/dL — AB (ref 12.0–16.0)
LYMPHS PCT: 10 %
Lymphs Abs: 0.5 10*3/uL — ABNORMAL LOW (ref 1.0–3.6)
MCH: 29.9 pg (ref 26.0–34.0)
MCHC: 34.3 g/dL (ref 32.0–36.0)
MCV: 87 fL (ref 80.0–100.0)
MONO ABS: 0.4 10*3/uL (ref 0.2–0.9)
Monocytes Relative: 7 %
NEUTROS ABS: 3.9 10*3/uL (ref 1.4–6.5)
NEUTROS PCT: 81 %
Platelets: 392 10*3/uL (ref 150–440)
RBC: 3.2 MIL/uL — AB (ref 3.80–5.20)
RDW: 21.9 % — ABNORMAL HIGH (ref 11.5–14.5)
WBC: 4.9 10*3/uL (ref 3.6–11.0)

## 2017-06-27 LAB — COMPREHENSIVE METABOLIC PANEL
ALBUMIN: 3.4 g/dL — AB (ref 3.5–5.0)
ALK PHOS: 74 U/L (ref 38–126)
ALT: 21 U/L (ref 14–54)
AST: 20 U/L (ref 15–41)
Anion gap: 8 (ref 5–15)
BILIRUBIN TOTAL: 0.3 mg/dL (ref 0.3–1.2)
BUN: 13 mg/dL (ref 6–20)
CO2: 24 mmol/L (ref 22–32)
Calcium: 9 mg/dL (ref 8.9–10.3)
Chloride: 101 mmol/L (ref 101–111)
Creatinine, Ser: 0.93 mg/dL (ref 0.44–1.00)
GFR calc Af Amer: >60 mL/min (ref >60)
Glucose, Bld: 129 mg/dL — ABNORMAL HIGH (ref 65–99)
Potassium: 3.6 mmol/L (ref 3.5–5.1)
Sodium: 133 mmol/L — ABNORMAL LOW (ref 135–145)
TOTAL PROTEIN: 7.1 g/dL (ref 6.5–8.1)

## 2017-06-27 MED ORDER — SODIUM CHLORIDE 0.9 % IV SOLN
72.0000 mg/m2 | Freq: Once | INTRAVENOUS | Status: AC
  Administered 2017-06-27: 168 mg via INTRAVENOUS
  Filled 2017-06-27: qty 28

## 2017-06-27 MED ORDER — DIPHENHYDRAMINE HCL 50 MG/ML IJ SOLN
25.0000 mg | Freq: Once | INTRAMUSCULAR | Status: AC
  Administered 2017-06-27: 25 mg via INTRAVENOUS
  Filled 2017-06-27: qty 1

## 2017-06-27 MED ORDER — FAMOTIDINE IN NACL 20-0.9 MG/50ML-% IV SOLN
20.0000 mg | Freq: Once | INTRAVENOUS | Status: AC
  Administered 2017-06-27: 20 mg via INTRAVENOUS
  Filled 2017-06-27: qty 50

## 2017-06-27 MED ORDER — SODIUM CHLORIDE 0.9 % IV SOLN
Freq: Once | INTRAVENOUS | Status: AC
  Administered 2017-06-27: 11:00:00 via INTRAVENOUS
  Filled 2017-06-27: qty 1000

## 2017-06-27 MED ORDER — DEXAMETHASONE SODIUM PHOSPHATE 10 MG/ML IJ SOLN
10.0000 mg | Freq: Once | INTRAMUSCULAR | Status: AC
  Administered 2017-06-27: 10 mg via INTRAVENOUS
  Filled 2017-06-27: qty 1

## 2017-06-27 MED ORDER — HEPARIN SOD (PORK) LOCK FLUSH 100 UNIT/ML IV SOLN
500.0000 [IU] | Freq: Once | INTRAVENOUS | Status: AC | PRN
  Administered 2017-06-27: 500 [IU]
  Filled 2017-06-27: qty 5

## 2017-06-27 NOTE — Progress Notes (Signed)
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  INTERVAL HISTORY: Patient returns to clinic today for further evaluation and consideration of cycle 7 of 12 of weekly Taxol. She had a colonoscopy last week did not reveal any malignancy. Patient noted to have an abscess and has subsequently been placed on antibiotics. She currently feels well and is asymptomatic. She has no neurologic complaints. She denies any recent fevers or illnesses. She has a good appetite and denies weight loss. She has no chest pain or shortness of breath. She denies any nausea, vomiting, constipation, or diarrhea. She has no urinary complaints. Patient offers no further specific complaints today.  REVIEW OF SYSTEMS:   Review of Systems  Constitutional: Negative.  Negative for fever, malaise/fatigue and weight loss.  HENT: Negative.  Negative for congestion.   Respiratory: Negative.  Negative for cough and shortness of breath.   Cardiovascular: Negative.  Negative for chest pain and leg swelling.  Gastrointestinal: Negative.  Negative for abdominal pain, nausea and vomiting.  Genitourinary: Negative.   Skin: Negative.  Negative for rash.  Neurological: Positive for sensory change. Negative for weakness.  Psychiatric/Behavioral: Negative.  The patient is not nervous/anxious.     As per HPI. Otherwise, a complete review of systems is negative.  PAST MEDICAL HISTORY: Past Medical History:  Diagnosis Date  . Arthritis    SHOULDER  . Cancer ([place]) 02/28/2017   INVASIVE MAMMARY  CARCINOMA WITH MUCINOUS FEATURES.  . Colonic mass 06/21/2017  . Hypertension   . Irregular heart beat    PT STATES IT "SKIPS A BEAT"   . Obesity     PAST SURGICAL HISTORY: Past Surgical History:  Procedure Laterality Date  . ANKLE SURGERY    . BREAST BIOPSY Right 02/28/2017   INVASIVE MAMMARY CARCINOMA WITH MUCINOUS FEATURES.  Marland Kitchen BREAST CYST ASPIRATION Right    NEG  . COLONOSCOPY WITH PROPOFOL N/A 06/25/2017   Procedure: COLONOSCOPY WITH PROPOFOL;  Surgeon: Jonathon Bellows, MD;  Location: [facility] ENDOSCOPY;  Service: Gastroenterology;  Laterality: N/A;  . PORTACATH PLACEMENT Left 03/15/2017   Procedure: INSERTION PORT-A-CATH;  Surgeon: Robert Bellow, MD;  Location: [facility] ORS;  Service: General;  Laterality: Left;    FAMILY HISTORY: Family History  Problem Relation Age of Onset  . Diabetes Father   . Stroke Father   . Breast cancer Mother 84  . Brain cancer Maternal Aunt 60  . Colon cancer Neg Hx     ADVANCED DIRECTIVES (Y/N):  N  HEALTH MAINTENANCE: Social History  Substance Use Topics  . Smoking status: Current Every Day Smoker    Packs/day: 0.25    Years: 25.00    Types: Cigarettes  . Smokeless tobacco: Never Used  . Alcohol use Yes     Comment: occas     Colonoscopy:  PAP:  Bone density:  Lipid panel:  Allergies  Allergen Reactions  . Other Hives and Itching    [name]     Current Outpatient Prescriptions  Medication Sig Dispense Refill  .  baclofen (LIORESAL) 10 MG tablet Take 1 tablet (10 mg total) by mouth 3 (three) times daily. 30 each 0  . ciprofloxacin (CIPRO) 500 MG tablet Take 1 tablet (500 mg total) by mouth 2 (two) times daily. 20 tablet 0  . lidocaine-prilocaine (EMLA) cream Apply to affected area once 30 g 3  . magic mouthwash w/lidocaine SOLN Take 5 mLs by mouth 4 (four) times daily as needed for mouth pain. 480 mL 3  . metroNIDAZOLE (FLAGYL) 500 MG  tablet Take 1 tablet (500 mg total) by mouth 3 (three) times daily. 30 tablet 0  . ondansetron (ZOFRAN) 8 MG tablet Take 1 tablet (8 mg total) by mouth 2 (two) times daily as needed. 60 tablet 3  . prochlorperazine (COMPAZINE) 10 MG tablet Take 1 tablet (10 mg total) by mouth every 6 (six) hours as needed (Nausea or vomiting). 60 tablet 3  . triamterene-hydrochlorothiazide (MAXZIDE-25) 37.5-25 MG tablet Take 1 tablet by mouth daily. 90 tablet 1   No current facility-administered medications for this visit.     OBJECTIVE: Vitals:   06/27/17 1009  BP: 132/84  Pulse: 84  Temp: (!) 97.2 F (36.2 C)     Body mass index is 39.37 kg/m.    ECOG FS:0 - Asymptomatic  General: Well-developed, well-nourished, no acute distress. Eyes: Pink conjunctiva, anicteric sclera. Breasts: Patient requested exam be deferred today. Lungs: Clear to auscultation bilaterally. Heart: Regular rate and rhythm. No rubs, murmurs, or gallops. Abdomen: Soft, tender, non-distended left upper and lower quadrant 6/10 pain with BM. No organomegaly noted, normoactive bowel sounds. Musculoskeletal: No edema, cyanosis, or clubbing. Neuro: Alert, answering all questions appropriately. Cranial nerves grossly intact. Skin: No rashes or petechiae noted. Psych: Normal affect.  LAB RESULTS:  Lab Results  Component Value Date   NA 133 (L) 06/27/2017   K 3.6 06/27/2017   CL 101 06/27/2017   CO2 24 06/27/2017   GLUCOSE 129 (H) 06/27/2017   BUN 13 06/27/2017   CREATININE 0.93 06/27/2017   CALCIUM 9.0 06/27/2017   PROT 7.1 06/27/2017   ALBUMIN 3.4 (L) 06/27/2017   AST 20 06/27/2017   ALT 21 06/27/2017   ALKPHOS 74 06/27/2017   BILITOT 0.3 06/27/2017   GFRNONAA >60 06/27/2017   GFRAA >60 06/27/2017    Lab Results  Component Value Date   WBC 4.9 06/27/2017   NEUTROABS 3.9 06/27/2017   HGB 9.6 (L) 06/27/2017   HCT 27.9 (L) 06/27/2017   MCV 87.0 06/27/2017   PLT 392 06/27/2017     STUDIES: Ct Abdomen Pelvis W  Contrast  Result Date: 06/19/2017 CLINICAL DATA:  Recent diagnosis of right upper outer quadrant breast cancer for which the patient is undergoing neoadjuvant chemotherapy. Patient presents with approximate 1 month history of left upper quadrant and left lower quadrant abdominal pain. EXAM: CT ABDOMEN AND PELVIS WITH CONTRAST TECHNIQUE: Multidetector CT imaging of the abdomen and pelvis was performed using the standard protocol following bolus administration of intravenous contrast. CONTRAST:  100 mL Isovue 370 IV. COMPARISON:  PET-CT 03/09/2017.  CT abdomen and pelvis 05/02/2012. FINDINGS: Lower chest: Heart size normal.  Visualized lung bases clear. Hepatobiliary: Liver normal in size and appearance. Gallbladder normal in appearance without calcified gallstones. No biliary ductal dilation. Pancreas: Normal in appearance without evidence of mass, ductal dilation, or inflammation. Spleen: Normal in size and appearance. Adrenals/Urinary Tract: Normal appearing adrenal glands. Kidneys normal in size and appearance without focal parenchymal abnormality. No evidence of urinary tract calculi or obstruction. Normal-appearing urinary bladder.  Stomach/Bowel: Stomach normal in appearance for the degree of distention. Normal-appearing small bowel. Descending and sigmoid colon diverticulosis with scattered diverticula in the transverse and ascending colon. Circumferential enhancing mass involving the proximal sigmoid colon extending over an approximate 6 cm length. Posterior to the mass is a thick walled fluid collection measuring approximately 2.4 x 2.4 x 3.1 cm. Mild edema/inflammation is present in the surrounding fat. Normal-appearing appendix in the right upper pelvis. Vascular/Lymphatic: Mild bilateral common iliac artery atherosclerosis. No visible aortic atherosclerosis. Normal-appearing portal venous and systemic venous systems. While normal in size, a lymph node in the left external iliac chain which measures  approximately 1.0 x 0.8 cm is larger than the contralateral external iliac nodes; however, the node did not demonstrate hypermetabolic activity on the prior PET-CT. Scattered nodes are present in the fat adjacent to the sigmoid colon. No pathologic lymphadenopathy elsewhere in the abdomen or pelvis. Reproductive: Normal appearing uterus. Approximate 2.5 cm right ovarian cyst. No significant adnexal masses. Other: No ascites. Musculoskeletal: Degenerative disc disease at L3-4 and L4-5. Facet degenerative changes at L4-5 and L5-S1. Lower thoracic spondylosis. No acute osseous abnormality. No evidence of osseous metastatic disease. IMPRESSION: 1. Circumferential enhancing mass involving the proximal sigmoid colon extending over an approximate 6 cm length. The patient has sigmoid diverticulosis, and there is an approximate 3 cm thick walled abscess adjacent to the colonic mass. However, I believe this is more likely to represent a sigmoid colon malignancy with prior microperforation and development of a walled off abscess (rather than the sequelae of diverticulitis). 2. No evidence of metastatic disease in the abdomen or pelvis. 3. No evidence of osseous metastatic disease. Electronically Signed   By: Evangeline Dakin M.D.   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  PLAN:   1. 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 Pretreatment MUGA revealed an EF of 59%. PET scan results reviewed independently with no obvious metastatic disease, but FDG positive area and her sigmoid colon. After the completion of her neoadjuvant chemotherapy,  she will require definitive surgery and possibly adjuvant XRT. Finally, given the ER/PR status of her tumor she will benefit from either tamoxifen or an aromatase inhibitor for total 5 years. Patient completed 4 cycles of Adriamycin and Cytoxan. Proceed with cycle 7 of 12 of weekly Taxol which has been 10% given her peripheral neuropathy. Return to clinic in 1 week for consideration of cycle 8 and then in 2 weeks for further evaluation and consideration of cycle 9. 2. Genetic testing: Negative. 3. Left upper and lower abdominal pain: Patient had colonoscopy last week did not reveal any malignancy, but did reveal an abscess. Patient has been instructed to continue her antibiotics as prescribed.   4. Anemia: Stable, monitor. 5. Joint pain: Improved. Patient does not complain of pain today.  6. Peripheral neuropathy: Dose reduce Taxol as above. Fingers only today.   Patient expressed understanding and was in agreement with this plan. She also understands that She can call clinic at any time with any questions, concerns, or complaints.   Cancer Staging Malignant neoplasm of upper-outer quadrant of right breast in female, estrogen receptor positive ([place]) Staging form:  Breast, AJCC 8th Edition - Clinical stage from 03/05/2017: Stage IIA (cT2, cN2a, cM0, G2, ER: Positive, PR: Positive, HER2: Negative) - Signed by Lloyd Huger, MD on 03/19/2017   Lloyd Huger, MD   06/30/2017 7:36 AM

## 2017-07-02 NOTE — Progress Notes (Signed)
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  INTERVAL HISTORY: Patient returns to clinic today for further evaluation and consideration of cycle 8 of 12 of weekly Taxol. She has continued to have peripheral neuropathy which she describes as worsening and now the palm of her right hand of the bottom of her left foot. She does feel it has progressed over her treatments. She also complains of worsening ability to fall asleep. She practices good sleep hygiene but continues to suffer from insomnia. 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 She has not tried medications at home and feel this has worsened through chemotherapy.  She had a colonoscopy last week did not reveal any malignancy. Patient noted to have an abscess and has subsequently been placed on antibiotics. She currently feels well and is asymptomatic. She has no neurologic complaints. She denies any recent fevers or illnesses. She has a good appetite and denies weight loss. She has no chest pain or shortness of breath. She denies any nausea, vomiting, constipation, or diarrhea. She has no urinary complaints. Patient offers no further specific complaints today.  REVIEW OF SYSTEMS:   Review of Systems  Constitutional: Negative.  Negative for fever, malaise/fatigue and weight loss.  HENT: Negative.  Negative for congestion.   Respiratory: Negative.  Negative for cough and shortness of breath.   Cardiovascular: Negative.  Negative for chest pain and leg swelling.  Gastrointestinal:  Negative.  Negative for abdominal pain, nausea and vomiting.  Genitourinary: Negative.   Skin: Negative.  Negative for rash.  Neurological: Positive for sensory change. Negative for weakness.  Psychiatric/Behavioral: Negative.  The patient is not nervous/anxious.     As per HPI. Otherwise, a complete review of systems is negative.  PAST MEDICAL HISTORY: Past Medical History:  Diagnosis Date  . Arthritis    SHOULDER  . Cancer ([place]) 02/28/2017   INVASIVE MAMMARY CARCINOMA WITH MUCINOUS FEATURES.  . Colonic mass 06/21/2017  . Hypertension   . Irregular heart beat    PT STATES IT "SKIPS A BEAT"   . Obesity     PAST SURGICAL HISTORY: Past Surgical History:  Procedure Laterality Date  . ANKLE SURGERY    . BREAST BIOPSY Right 02/28/2017   INVASIVE MAMMARY CARCINOMA WITH MUCINOUS FEATURES.  Marland Kitchen BREAST CYST ASPIRATION Right    NEG  . COLONOSCOPY WITH PROPOFOL N/A 06/25/2017   Procedure: COLONOSCOPY WITH PROPOFOL;  Surgeon: Jonathon Bellows, MD;  Location: [facility] ENDOSCOPY;  Service: Gastroenterology;  Laterality: N/A;  . PORTACATH PLACEMENT Left 03/15/2017   Procedure: INSERTION PORT-A-CATH;  Surgeon: Robert Bellow, MD;  Location: [facility] ORS;  Service: General;  Laterality: Left;    FAMILY HISTORY: Family History  Problem Relation Age of Onset  . Diabetes Father   . Stroke Father   . Breast cancer Mother 61  . Brain cancer Maternal Aunt 60  . Colon cancer Neg Hx     ADVANCED DIRECTIVES (Y/N):  N  HEALTH MAINTENANCE: Social History  Substance Use Topics  . Smoking status: Current Every Day Smoker    Packs/day: 0.25  Years: 25.00    Types: Cigarettes  . Smokeless tobacco: Never Used  . Alcohol use Yes     Comment: occas     Colonoscopy:  PAP:  Bone density:  Lipid panel:  Allergies  Allergen Reactions  . Other Hives and Itching    [name]     Current Outpatient  Prescriptions  Medication Sig Dispense Refill  . ciprofloxacin (CIPRO) 500 MG tablet Take 1 tablet (500 mg total) by mouth 2 (two) times daily. 20 tablet 0  . lidocaine-prilocaine (EMLA) cream Apply to affected area once 30 g 3  . metroNIDAZOLE (FLAGYL) 500 MG tablet Take 1 tablet (500 mg total) by mouth 3 (three) times daily. 30 tablet 0  . ondansetron (ZOFRAN) 8 MG tablet Take 1 tablet (8 mg total) by mouth 2 (two) times daily as needed. 60 tablet 3  . prochlorperazine (COMPAZINE) 10 MG tablet Take 1 tablet (10 mg total) by mouth every 6 (six) hours as needed (Nausea or vomiting). 60 tablet 3  . triamterene-hydrochlorothiazide (MAXZIDE-25) 37.5-25 MG tablet Take 1 tablet by mouth daily. 90 tablet 1  . baclofen (LIORESAL) 10 MG tablet Take 1 tablet (10 mg total) by mouth 3 (three) times daily. (Patient not taking: Reported on 07/04/2017) 30 each 0  . diphenhydrAMINE (BENADRYL) 25 MG tablet Take 1 tablet (25 mg total) by mouth at bedtime as needed for sleep. 30 tablet 0  . magic mouthwash w/lidocaine SOLN Take 5 mLs by mouth 4 (four) times daily as needed for mouth pain. (Patient not taking: Reported on 07/04/2017) 480 mL 3   No current facility-administered medications for this visit.     OBJECTIVE: Vitals:   07/04/17 0934  BP: 117/85  Pulse: 81  Resp: 18  Temp: 97.8 F (36.6 C)     Body mass index is 39.53 kg/m.    ECOG FS:0 - Asymptomatic  General: Well-developed, well-nourished, no acute distress. Eyes: Pink conjunctiva, anicteric sclera. Breasts: Patient requested exam be deferred today. Lungs: Clear to auscultation bilaterally. Heart: Regular rate and rhythm. No rubs, murmurs, or gallops. Abdomen: Soft, tender, non-distended left upper and lower quadrant 6/10 pain with BM. No organomegaly noted, normoactive bowel sounds. Musculoskeletal: No edema, cyanosis, or clubbing. Neuro: Alert, answering all questions appropriately. Cranial nerves grossly intact. Skin: No rashes or  petechiae noted. Psych: Normal affect.  LAB RESULTS:  Lab Results  Component Value Date   NA 136 07/04/2017   K 3.2 (L) 07/04/2017   CL 103 07/04/2017   CO2 24 07/04/2017   GLUCOSE 121 (H) 07/04/2017   BUN 12 07/04/2017   CREATININE 0.74 07/04/2017   CALCIUM 9.2 07/04/2017   PROT 7.1 07/04/2017   ALBUMIN 3.5 07/04/2017   AST 26 07/04/2017   ALT 27 07/04/2017   ALKPHOS 74 07/04/2017   BILITOT 0.4 07/04/2017   GFRNONAA >60 07/04/2017   GFRAA >60 07/04/2017    Lab Results  Component Value Date   WBC 5.3 07/04/2017   NEUTROABS 4.3 07/04/2017   HGB 9.8 (L) 07/04/2017   HCT 28.7 (L) 07/04/2017   MCV 87.8 07/04/2017   PLT 400 07/04/2017     STUDIES: Ct Abdomen Pelvis W Contrast  Result Date: 06/19/2017 CLINICAL DATA:  Recent diagnosis of right upper outer quadrant breast cancer for which the patient is undergoing neoadjuvant chemotherapy. Patient presents with approximate 1 month history of left upper quadrant and left lower  quadrant abdominal pain. EXAM: CT ABDOMEN AND PELVIS WITH CONTRAST TECHNIQUE: Multidetector CT imaging of the abdomen and pelvis was performed using the standard protocol following bolus administration of intravenous contrast. CONTRAST:  100 mL Isovue 370 IV. COMPARISON:  PET-CT 03/09/2017.  CT abdomen and pelvis 05/02/2012. FINDINGS: Lower chest: Heart size normal.  Visualized lung bases clear. Hepatobiliary: Liver normal in size and appearance. Gallbladder normal in appearance without calcified gallstones. No biliary ductal dilation. Pancreas: Normal in appearance without evidence of mass, ductal dilation, or inflammation. Spleen: Normal in size and appearance. Adrenals/Urinary Tract: Normal appearing adrenal glands. Kidneys normal in size and appearance without focal parenchymal abnormality. No evidence of urinary tract calculi or obstruction. Normal-appearing urinary bladder. Stomach/Bowel: Stomach normal in appearance for the degree of distention.  Normal-appearing small bowel. Descending and sigmoid colon diverticulosis with scattered diverticula in the transverse and ascending colon. Circumferential enhancing mass involving the proximal sigmoid colon extending over an approximate 6 cm length. Posterior to the mass is a thick walled fluid collection measuring approximately 2.4 x 2.4 x 3.1 cm. Mild edema/inflammation is present in the surrounding fat. Normal-appearing appendix in the right upper pelvis. Vascular/Lymphatic: Mild bilateral common iliac artery atherosclerosis. No visible aortic atherosclerosis. Normal-appearing portal venous and systemic venous systems. While normal in size, a lymph node in the left external iliac chain which measures approximately 1.0 x 0.8 cm is larger than the contralateral external iliac nodes; however, the node did not demonstrate hypermetabolic activity on the prior PET-CT. Scattered nodes are present in the fat adjacent to the sigmoid colon. No pathologic lymphadenopathy elsewhere in the abdomen or pelvis. Reproductive: Normal appearing uterus. Approximate 2.5 cm right ovarian cyst. No significant adnexal masses. Other: No ascites. Musculoskeletal: Degenerative disc disease at L3-4 and L4-5. Facet degenerative changes at L4-5 and L5-S1. Lower thoracic spondylosis. No acute osseous abnormality. No evidence of osseous metastatic disease. IMPRESSION: 1. Circumferential enhancing mass involving the proximal sigmoid colon extending over an approximate 6 cm length. The patient has sigmoid diverticulosis, and there is an approximate 3 cm thick walled abscess adjacent to the colonic mass. However, I believe this is more likely to represent a sigmoid colon malignancy with prior microperforation and development of a walled off abscess (rather than the sequelae of diverticulitis). 2. No evidence of metastatic disease in the abdomen or pelvis. 3. No evidence of osseous metastatic disease. Electronically Signed   By: Evangeline Dakin  M.D.   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  PLAN:   1. 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 Pretreatment MUGA revealed an EF of 59%. PET scan results reviewed independently with no obvious metastatic disease, but FDG positive area and her sigmoid colon. After the completion of her neoadjuvant chemotherapy, she will require definitive surgery and possibly adjuvant XRT. Finally, given the ER/PR status of her tumor she will benefit from either tamoxifen or an aromatase inhibitor for total 5 years. Patient completed 4 cycles of Adriamycin and Cytoxan. Will hold taxol this week due to peripheral neuropathy. Return to clinic in 1 week for labs and reconsideration of cycle 8 of 12 of weekly taxol. 2. Genetic testing: pending 3. Left  upper and lower abdominal pain: Patient had colonoscopy last week did not reveal any malignancy, but did reveal an abscess. Patient has been instructed to continue her antibiotics as prescribed.   4. Anemia: Stable, monitor. 5. Joint pain: Improved. Patient does not complain of pain today.  6. Peripheral neuropathy: Dose reduce Taxol previously. Now covering more of her hand and foot. 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 Holding treatment today and may consider possible need for continued dose reduction of Taxol.  7. Insomnia- will start benadryl 66m prn at bedtime. Due to her career, I am hesitant to give anything too sedating or with a long half life. Encouraged her to try the benadryl on a night when she does not have to be up and alert the next morning and  can dedicate a full 8 hours to sleep.   Patient expressed understanding and was in agreement with this plan. She also understands that She can call clinic at any time with any questions, concerns, or complaints.   Cancer Staging Malignant neoplasm of upper-outer quadrant of right breast in female, [place] receptor positive [place] Staging form: Breast, AJCC 8th Edition - Clinical stage from 03/05/2017: Stage IIA (cT2, cN2a, cM0, G2, ER: Positive, PR: Positive, HER2: Negative) - Signed by FLloyd Huger MD on 03/19/2017   LBeckey Rutter NP  Patient was seen and evaluated independently and I agree with the assessment and plan as dictated above.  TLloyd Huger MD 07/04/17 1:48 PM

## 2017-07-04 ENCOUNTER — Ambulatory Visit (INDEPENDENT_AMBULATORY_CARE_PROVIDER_SITE_OTHER): Payer: Medicaid Other | Admitting: Nurse Practitioner

## 2017-07-04 ENCOUNTER — Inpatient Hospital Stay: Payer: Medicaid Other

## 2017-07-04 ENCOUNTER — Inpatient Hospital Stay (HOSPITAL_BASED_OUTPATIENT_CLINIC_OR_DEPARTMENT_OTHER): Payer: Medicaid Other | Admitting: Oncology

## 2017-07-04 ENCOUNTER — Encounter: Payer: Self-pay | Admitting: Nurse Practitioner

## 2017-07-04 VITALS — BP 124/74 | HR 64 | Temp 98.5°F | Ht 66.0 in | Wt 245.0 lb

## 2017-07-04 VITALS — BP 117/85 | HR 81 | Temp 97.8°F | Resp 18 | Wt 244.9 lb

## 2017-07-04 DIAGNOSIS — Z5111 Encounter for antineoplastic chemotherapy: Secondary | ICD-10-CM | POA: Diagnosis not present

## 2017-07-04 DIAGNOSIS — I10 Essential (primary) hypertension: Secondary | ICD-10-CM | POA: Diagnosis not present

## 2017-07-04 DIAGNOSIS — R1906 Epigastric swelling, mass or lump: Secondary | ICD-10-CM

## 2017-07-04 DIAGNOSIS — N83201 Unspecified ovarian cyst, right side: Secondary | ICD-10-CM | POA: Diagnosis not present

## 2017-07-04 DIAGNOSIS — K5730 Diverticulosis of large intestine without perforation or abscess without bleeding: Secondary | ICD-10-CM | POA: Diagnosis not present

## 2017-07-04 DIAGNOSIS — C50411 Malignant neoplasm of upper-outer quadrant of right female breast: Secondary | ICD-10-CM | POA: Diagnosis not present

## 2017-07-04 DIAGNOSIS — D649 Anemia, unspecified: Secondary | ICD-10-CM

## 2017-07-04 DIAGNOSIS — T451X5A Adverse effect of antineoplastic and immunosuppressive drugs, initial encounter: Secondary | ICD-10-CM | POA: Insufficient documentation

## 2017-07-04 DIAGNOSIS — K529 Noninfective gastroenteritis and colitis, unspecified: Secondary | ICD-10-CM | POA: Diagnosis not present

## 2017-07-04 DIAGNOSIS — Z803 Family history of malignant neoplasm of breast: Secondary | ICD-10-CM

## 2017-07-04 DIAGNOSIS — G620 Drug-induced polyneuropathy: Secondary | ICD-10-CM | POA: Insufficient documentation

## 2017-07-04 DIAGNOSIS — Z809 Family history of malignant neoplasm, unspecified: Secondary | ICD-10-CM

## 2017-07-04 DIAGNOSIS — G629 Polyneuropathy, unspecified: Secondary | ICD-10-CM

## 2017-07-04 DIAGNOSIS — Z170 Estrogen receptor positive status [ER+]: Principal | ICD-10-CM

## 2017-07-04 DIAGNOSIS — F17210 Nicotine dependence, cigarettes, uncomplicated: Secondary | ICD-10-CM | POA: Diagnosis not present

## 2017-07-04 DIAGNOSIS — I499 Cardiac arrhythmia, unspecified: Secondary | ICD-10-CM

## 2017-07-04 DIAGNOSIS — K651 Peritoneal abscess: Secondary | ICD-10-CM

## 2017-07-04 DIAGNOSIS — R1012 Left upper quadrant pain: Secondary | ICD-10-CM

## 2017-07-04 DIAGNOSIS — M2550 Pain in unspecified joint: Secondary | ICD-10-CM

## 2017-07-04 DIAGNOSIS — M5136 Other intervertebral disc degeneration, lumbar region: Secondary | ICD-10-CM

## 2017-07-04 DIAGNOSIS — E669 Obesity, unspecified: Secondary | ICD-10-CM

## 2017-07-04 LAB — COMPREHENSIVE METABOLIC PANEL
ALT: 27 U/L (ref 14–54)
AST: 26 U/L (ref 15–41)
Albumin: 3.5 g/dL (ref 3.5–5.0)
Alkaline Phosphatase: 74 U/L (ref 38–126)
Anion gap: 9 (ref 5–15)
BUN: 12 mg/dL (ref 6–20)
CO2: 24 mmol/L (ref 22–32)
Calcium: 9.2 mg/dL (ref 8.9–10.3)
Chloride: 103 mmol/L (ref 101–111)
Creatinine, Ser: 0.74 mg/dL (ref 0.44–1.00)
GFR calc Af Amer: >60 mL/min (ref >60)
GFR calc non Af Amer: >60 mL/min (ref >60)
Glucose, Bld: 121 mg/dL — ABNORMAL HIGH (ref 65–99)
Potassium: 3.2 mmol/L — ABNORMAL LOW (ref 3.5–5.1)
Sodium: 136 mmol/L (ref 135–145)
Total Bilirubin: 0.4 mg/dL (ref 0.3–1.2)
Total Protein: 7.1 g/dL (ref 6.5–8.1)

## 2017-07-04 LAB — CBC WITH DIFFERENTIAL/PLATELET
BASOS ABS: 0 10*3/uL (ref 0–0.1)
BASOS PCT: 0 %
EOS ABS: 0.1 10*3/uL (ref 0–0.7)
EOS PCT: 1 %
HEMATOCRIT: 28.7 % — AB (ref 35.0–47.0)
Hemoglobin: 9.8 g/dL — ABNORMAL LOW (ref 12.0–16.0)
Lymphocytes Relative: 11 %
Lymphs Abs: 0.6 10*3/uL — ABNORMAL LOW (ref 1.0–3.6)
MCH: 29.9 pg (ref 26.0–34.0)
MCHC: 34.1 g/dL (ref 32.0–36.0)
MCV: 87.8 fL (ref 80.0–100.0)
MONO ABS: 0.4 10*3/uL (ref 0.2–0.9)
MONOS PCT: 8 %
NEUTROS ABS: 4.3 10*3/uL (ref 1.4–6.5)
Neutrophils Relative %: 80 %
PLATELETS: 400 10*3/uL (ref 150–440)
RBC: 3.27 MIL/uL — ABNORMAL LOW (ref 3.80–5.20)
RDW: 22.1 % — AB (ref 11.5–14.5)
WBC: 5.3 10*3/uL (ref 3.6–11.0)

## 2017-07-04 MED ORDER — DIPHENHYDRAMINE HCL 25 MG PO TABS: 25.0000 mg | ORAL_TABLET | Freq: Every evening | ORAL | 0 refills | Status: DC | PRN

## 2017-07-04 MED ORDER — LIDOCAINE-PRILOCAINE 2.5-2.5 % EX CREA: TOPICAL_CREAM | CUTANEOUS | 3 refills | Status: DC

## 2017-07-04 NOTE — Assessment & Plan Note (Signed)
Stable and controlled today.  Pt taking Maxide-25 once daily and tolerating well.  Plan: 1. Continue medication w/o changes. 2. Continue heart healthy diet. 3. Increase physical activity to 30 minutes most days of the week. 4. Follow up 6 months.

## 2017-07-04 NOTE — Patient Instructions (Addendum)
Dyana, Thank you for coming in to clinic today.  1. Your BP is normal.  Continue your triamterene and hydrochlorothiazide pill without changes. - Continue your heart healthy diet.   2. Ask your oncology team: - about treating your neuropathy symptoms - ask them about your inability to take a deep breath all the time.   Please schedule a follow-up appointment with Cassell Smiles, AGNP. Return in about 6 months (around 01/04/2018) for blood pressure or sooner if you need [place] sooner.    If you have any other questions or concerns, please feel free to call the clinic or send a message through [facility]. You may also schedule an earlier appointment if necessary.  You will receive a survey after today's visit either digitally by e-mail or paper by [employer]. Your experiences and feedback matter to [place].  Please respond so we know how we are doing as we provide care for you.   Cassell Smiles, DNP, AGNP-BC Adult Gerontology Nurse Practitioner [facility]    Peripheral Neuropathy Peripheral neuropathy is a type of nerve damage. It affects nerves that carry signals between the spinal cord and other parts of the body. These are called peripheral nerves. With peripheral neuropathy, one nerve or a group of nerves may be damaged. What are the causes? Many things can damage peripheral nerves. For some people with peripheral neuropathy, the cause is unknown. Some causes include:  Diabetes. This is the most common cause of peripheral neuropathy.  Injury to a nerve.  Pressure or stress on a nerve that lasts a long time.  Too little vitamin B. Alcoholism can lead to this.  Infections.  Autoimmune diseases, such as multiple sclerosis and systemic lupus erythematosus.  Inherited nerve diseases.  Some medicines, such as cancer drugs.  Toxic substances, such as lead and mercury.  Too little blood flowing to the legs.  Kidney disease.  Thyroid disease.  What are the  signs or symptoms? Different people have different symptoms. The symptoms you have will depend on which of your nerves is damaged. Common symptoms include:  Loss of feeling (numbness) in the feet and hands.  Tingling in the feet and hands.  Pain that burns.  Very sensitive skin.  Weakness.  Not being able to move a part of the body (paralysis).  Muscle twitching.  Clumsiness or poor coordination.  Loss of balance.  Not being able to control your bladder.  Feeling dizzy.  Sexual problems.  How is this diagnosed? Peripheral neuropathy is a symptom, not a disease. Finding the cause of peripheral neuropathy can be hard. To figure that out, your health care provider will take a medical history and do a physical exam. A neurological exam will also be done. This involves checking things affected by your brain, spinal cord, and nerves (nervous system). For example, your health care provider will check your reflexes, how you move, and what you can feel. Other types of tests may also be ordered, such as:  Blood tests.  A test of the fluid in your spinal cord.  Imaging tests, such as CT scans or an MRI.  Electromyography (EMG). This test checks the nerves that control muscles.  Nerve conduction velocity tests. These tests check how fast messages pass through your nerves.  Nerve biopsy. A small piece of nerve is removed. It is then checked under a microscope.  How is this treated?  Medicine is often used to treat peripheral neuropathy. Medicines may include: ? Pain-relieving medicines. Prescription or over-the-counter  medicine may be suggested. ? Antiseizure medicine. This may be used for pain. ? Antidepressants. These also may help ease pain from neuropathy. ? Lidocaine. This is a numbing medicine. You might wear a patch or be given a shot. ? Mexiletine. This medicine is typically used to help control irregular heart rhythms.  Surgery. Surgery may be needed to relieve  pressure on a nerve or to destroy a nerve that is causing pain.  Physical therapy to help movement.  Assistive devices to help movement. Follow these instructions at home:  Only take over-the-counter or prescription medicines as directed by your health care provider. Follow the instructions carefully for any given medicines. Do not take any other medicines without first getting approval from your health care provider.  If you have diabetes, work closely with your health care provider to keep your blood sugar under control.  If you have numbness in your feet: ? Check every day for signs of injury or infection. Watch for redness, warmth, and swelling. ? Wear padded socks and comfortable shoes. These help protect your feet.  Do not do things that put pressure on your damaged nerve.  Do not smoke. Smoking keeps blood from getting to damaged nerves.  Avoid or limit alcohol. Too much alcohol can cause a lack of B vitamins. These vitamins are needed for healthy nerves.  Develop a good support system. Coping with peripheral neuropathy can be stressful. Talk to a mental health specialist or join a support group if you are struggling.  Follow up with your health care provider as directed. Contact a health care provider if:  You have new signs or symptoms of peripheral neuropathy.  You are struggling emotionally from dealing with peripheral neuropathy.  You have a fever. Get help right away if:  You have an injury or infection that is not healing.  You feel very dizzy or begin vomiting.  You have chest pain.  You have trouble breathing. This information is not intended to replace advice given to you by your health care provider. Make sure you discuss any questions you have with your health care provider. Document Released: 10/27/2002 Document Revised: 04/13/2016 Document Reviewed: 07/14/2013 Elsevier Interactive Patient Education  2017 [employer].

## 2017-07-04 NOTE — Progress Notes (Signed)
Patient is here for follow up, she mentions she is having trouble sleeping.

## 2017-07-04 NOTE — Assessment & Plan Note (Signed)
Abdominal pain improving on antibiotics w/ normal abdominal exam today.  Plan: 1. Continue antibiotics until completed all pills.  Encouraged tid dosing for metronidazole. 2. Continue w/ repeat CT scan in about 1-2 weeks. 3. Follow up as needed.

## 2017-07-04 NOTE — Progress Notes (Signed)
I have reviewed this encounter including the documentation in this note and/or discussed this patient with the provider, Cassell Smiles, AGPCNP-BC. I am certifying that I agree with the content of this note as supervising physician.  Nobie Putnam, [facility] 07/04/2017, 1:26 PM

## 2017-07-04 NOTE — Progress Notes (Signed)
Subjective:    Patient ID: Chelsea Hudson, female    DOB: 13-Sep-1966, 51 y.o.   MRN: [id number]  Chelsea Hudson is a 51 y.o. female presenting on 07/04/2017 for Hypertension and Peripheral Neuropathy (numbness, tingling in hands and feet. )   HPI  Hypertension - She is not checking BP at home or outside of clinic.     Is generally 115-125/70-80 when checked at the cancer center and other office visits. - Current medications: Maxide-25 once daily, tolerating well without side effects - Pt denies headache, lightheadedness, dizziness, changes in vision, chest tightness/pressure, palpitations, leg swelling, sudden loss of speech or loss of consciousness. - She  reports no regular exercise routine. - Her diet is low in salt, moderate in fat, and moderate in carbohydrates, high in vegetables and avoids fried foods.  Diverticulitis/Abscess Had pain in abdomen LLQ when taking flagyl tid.  Is now only taking cipro and flagyl twice day and pain has resolved.  Peripheral Neuropathy Tingling and pain in hands and feet worst in right hand and left foot.  Constant pain Had started a crochet project but was unable to do it b/c of hands.  Feet have not affected her balance, but is painful to walk.  Social History  Substance Use Topics  . Smoking status: Current Every Day Smoker    Packs/day: 0.25    Years: 25.00    Types: Cigarettes  . Smokeless tobacco: Never Used  . Alcohol use Yes     Comment: occas    Review of Systems Per HPI unless specifically indicated above     Objective:    BP 124/74 (BP Location: Right Arm, Patient Position: Sitting, Cuff Size: Large)   Pulse 64   Temp 98.5 F (36.9 C) (Oral)   Ht 5\' 6"  (1.676 m)   Wt 245 lb (111.1 kg)   LMP 01/31/2014 (Approximate) Comment: LMP was 3 years ago.  BMI 39.54 kg/m   Wt Readings from Last 3 Encounters:  07/04/17 245 lb (111.1 kg)  06/27/17 243 lb 14.4 oz (110.6 kg)  06/25/17 245 lb (111.1 kg)    Physical Exam  General  - obese, well-appearing, NAD HEENT - Normocephalic, atraumatic, alopecia Heart - RRR, no murmurs heard Lungs - Clear throughout all lobes, no wheezing, crackles, or rhonchi. Normal work of breathing. Abdomen - soft, NTND, no masses, no hepatosplenomegaly, active bowel sounds Extremeties - non-tender, no edema, cap refill < 2 seconds, peripheral pulses intact +2 bilaterally, monofilament sensation intact in all extremities. Skin - warm, dry, no rashes Neuro - awake, alert, oriented x3, intact distal sensation to light touch, normal coordination, normal gait Psych - Normal mood and affect, normal behavior    Results for orders placed or performed in visit on 06/27/17  CBC with Differential  Result Value Ref Range   WBC 4.9 3.6 - 11.0 K/uL   RBC 3.20 (L) 3.80 - 5.20 MIL/uL   Hemoglobin 9.6 (L) 12.0 - 16.0 g/dL   HCT 27.9 (L) 35.0 - 47.0 %   MCV 87.0 80.0 - 100.0 fL   MCH 29.9 26.0 - 34.0 pg   MCHC 34.3 32.0 - 36.0 g/dL   RDW 21.9 (H) 11.5 - 14.5 %   Platelets 392 150 - 440 K/uL   Neutrophils Relative % 81 %   Neutro Abs 3.9 1.4 - 6.5 K/uL   Lymphocytes Relative 10 %   Lymphs Abs 0.5 (L) 1.0 - 3.6 K/uL   Monocytes Relative 7 %   Monocytes  Absolute 0.4 0.2 - 0.9 K/uL   Eosinophils Relative 2 %   Eosinophils Absolute 0.1 0 - 0.7 K/uL   Basophils Relative 0 %   Basophils Absolute 0.0 0 - 0.1 K/uL  Comprehensive metabolic panel  Result Value Ref Range   Sodium 133 (L) 135 - 145 mmol/L   Potassium 3.6 3.5 - 5.1 mmol/L   Chloride 101 101 - 111 mmol/L   CO2 24 22 - 32 mmol/L   Glucose, Bld 129 (H) 65 - 99 mg/dL   BUN 13 6 - 20 mg/dL   Creatinine, Ser 0.93 0.44 - 1.00 mg/dL   Calcium 9.0 8.9 - 10.3 mg/dL   Total Protein 7.1 6.5 - 8.1 g/dL   Albumin 3.4 (L) 3.5 - 5.0 g/dL   AST 20 15 - 41 U/L   ALT 21 14 - 54 U/L   Alkaline Phosphatase 74 38 - 126 U/L   Total Bilirubin 0.3 0.3 - 1.2 mg/dL   GFR calc non Af Amer >60 >60 mL/min   GFR calc Af Amer >60 >60 mL/min   Anion gap 8 5 -  15      Assessment & Plan:   Problem List Items Addressed This Visit      Cardiovascular and Mediastinum   Essential hypertension - Primary    Stable and controlled today.  Pt taking Maxide-25 once daily and tolerating well.  Plan: 1. Continue medication w/o changes. 2. Continue heart healthy diet. 3. Increase physical activity to 30 minutes most days of the week. 4. Follow up 6 months.        Digestive   Colitis    Abdominal pain improving on antibiotics w/ normal abdominal exam today.  Plan: 1. Continue antibiotics until completed all pills.  Encouraged tid dosing for metronidazole. 2. Continue w/ repeat CT scan in about 1-2 weeks. 3. Follow up as needed.        Nervous and Auditory   Peripheral neuropathy due to chemotherapy ([place])    Pt on chemotherapy w/ known peripheral neuropathy.  Dose of chemo titrated down based on presence of symptom in past.  Now interfering w/ pt's enjoyable activities outside of work.  Plan: 1. Instructed pt to discuss w/ oncology team today.  Will also send message to her team. 2. Consider gabapentin for symptom control or topical pain relievers if not wishing to mask symptoms of chemo. 3. Follow up as needed.         No orders of the defined types were placed in this encounter.     Follow up plan: Return in about 6 months (around 01/04/2018) for blood pressure or sooner if you need [place] sooner.  Cassell Smiles, DNP, AGPCNP-BC Adult Gerontology Primary Care Nurse Practitioner [facility] 07/04/2017, 8:15 AM

## 2017-07-04 NOTE — Assessment & Plan Note (Addendum)
Pt on chemotherapy w/ known peripheral neuropathy.  Dose of chemo titrated down based on presence of symptom in past.  Now interfering w/ pt's enjoyable activities outside of work.  Plan: 1. Instructed pt to discuss w/ oncology team today.  Will also send message to her team. 2. Consider gabapentin for symptom control or topical pain relievers if not wishing to mask symptoms of chemo. 3. Follow up as needed.

## 2017-07-10 NOTE — Progress Notes (Signed)
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  INTERVAL HISTORY: Patient returns to clinic today for further evaluation and consideration of cycle 8 of 12 of weekly Taxol. She has suffered from peripheral neuropathy that had interfered with her daily activities. Cycle 8 was held last week due to symptoms and she saw her PCP that afternoon who recommended gabapentin. Patient did not pick up medication to start. Today she feels her neuropathy has improved compared to last week but continues to feel it interferes with daily activities and describes nerve type pain in her left great toe. 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   Previously she had a colonoscopy that did not reveal malignancy. She was noted to have an abscess though and was placed on cipro and flagyl. She had pain when taking flagyl TID as prescribed and self dose reduced to BID which improved symptoms. She saw PCP for this on 07/04/17 who encouraged TID dosing. Patient did not pick up medication refill but denies abdominal pain. PCP continues to manage abscess.   She has no neurologic complaints. She denies any recent fevers or illnesses. She has a good appetite and denies weight loss. She has no chest pain or shortness of breath. She denies any nausea, vomiting, constipation, or diarrhea. She has no urinary complaints. Patient offers no further specific complaints today.  REVIEW OF SYSTEMS:   Review of Systems  Constitutional: Negative.  Negative for fever,  malaise/fatigue and weight loss.  HENT: Negative.  Negative for congestion.   Respiratory: Negative.  Negative for cough and shortness of breath.   Cardiovascular: Negative.  Negative for chest pain and leg swelling.  Gastrointestinal: Negative.  Negative for abdominal pain, nausea and vomiting.  Genitourinary: Negative.   Skin: Negative.  Negative for rash.  Neurological: Positive for sensory change. Negative for weakness.  Psychiatric/Behavioral: Negative.  The patient is not nervous/anxious.     As per HPI. Otherwise, a complete review of systems is negative.  PAST MEDICAL HISTORY: Past Medical History:  Diagnosis Date  . Arthritis    SHOULDER  . Cancer ([place]) 02/28/2017   INVASIVE MAMMARY CARCINOMA WITH MUCINOUS FEATURES.  . Colonic mass 06/21/2017  . Hypertension   . Irregular heart beat    PT STATES IT "SKIPS A BEAT"   . Obesity     PAST SURGICAL HISTORY: Past Surgical History:  Procedure Laterality Date  . ANKLE SURGERY    . BREAST BIOPSY Right 02/28/2017   INVASIVE MAMMARY CARCINOMA WITH MUCINOUS FEATURES.  Marland Kitchen BREAST CYST ASPIRATION Right    NEG  . COLONOSCOPY WITH PROPOFOL N/A 06/25/2017   Procedure: COLONOSCOPY WITH PROPOFOL;  Surgeon: Jonathon Bellows, MD;  Location: [facility] ENDOSCOPY;  Service: Gastroenterology;  Laterality: N/A;  . PORTACATH PLACEMENT Left 03/15/2017   Procedure: INSERTION PORT-A-CATH;  Surgeon: Robert Bellow, MD;  Location: [facility] ORS;  Service: General;  Laterality: Left;    FAMILY HISTORY: Family History  Problem Relation Age of Onset  . Diabetes Father   . Stroke Father   . Breast cancer Mother 19  . Brain cancer  Maternal Aunt 60  . Colon cancer Neg Hx     ADVANCED DIRECTIVES (Y/N):  N  HEALTH MAINTENANCE: Social History  Substance Use Topics  . Smoking status: Current Every Day Smoker    Packs/day: 0.25    Years: 25.00    Types: Cigarettes  . Smokeless tobacco: Never Used  . Alcohol use Yes     Comment: occas     Colonoscopy:  06/25/17  PAP:  Bone density:  Lipid panel:  Allergies  Allergen Reactions  . Other Hives and Itching    [name]     Current Outpatient Prescriptions  Medication Sig Dispense Refill  . baclofen (LIORESAL) 10 MG tablet Take 1 tablet (10 mg total) by mouth 3 (three) times daily. 30 each 0  . ciprofloxacin (CIPRO) 500 MG tablet Take 1 tablet (500 mg total) by mouth 2 (two) times daily. 20 tablet 0  . diphenhydrAMINE (BENADRYL) 25 MG tablet Take 1 tablet (25 mg total) by mouth at bedtime as needed for sleep. 30 tablet 0  . lidocaine-prilocaine (EMLA) cream Apply to affected area once 30 g 3  . magic mouthwash w/lidocaine SOLN Take 5 mLs by mouth 4 (four) times daily as needed for mouth pain. 480 mL 3  . ondansetron (ZOFRAN) 8 MG tablet Take 1 tablet (8 mg total) by mouth 2 (two) times daily as needed. 60 tablet 3  . prochlorperazine (COMPAZINE) 10 MG tablet Take 1 tablet (10 mg total) by mouth every 6 (six) hours as needed (Nausea or vomiting). 60 tablet 3  . triamterene-hydrochlorothiazide (MAXZIDE-25) 37.5-25 MG tablet Take 1 tablet by mouth daily. 90 tablet 1   No current facility-administered medications for this visit.    Facility-Administered Medications Ordered in Other Visits  Medication Dose Route Frequency Provider Last Rate Last Dose  . heparin lock flush 100 unit/mL  500 Units Intravenous Once Lloyd Huger, MD        OBJECTIVE: Vitals:   07/11/17 0949  BP: 118/78  Pulse: 84  Resp: 18  Temp: (!) 97.2 F (36.2 C)     Body mass index is 39.12 kg/m.    ECOG FS:0 - Asymptomatic  General: Well-developed, well-nourished, no acute distress. Eyes: Pink conjunctiva, anicteric sclera. Breasts: Patient requested exam be deferred today. Lungs: Clear to auscultation bilaterally. Heart: Regular rate and rhythm. No rubs, murmurs, or gallops. Abdomen: Soft, tender,  non-distended abdomen. No organomegaly noted, normoactive bowel sounds. Musculoskeletal: No edema, cyanosis, or clubbing. Neuro: Alert, answering all questions appropriately. Cranial nerves grossly intact. Skin: No rashes or petechiae noted. Psych: Normal affect.  LAB RESULTS:  Lab Results  Component Value Date   NA 135 07/11/2017   K 3.3 (L) 07/11/2017   CL 102 07/11/2017   CO2 24 07/11/2017   GLUCOSE 149 (H) 07/11/2017   BUN 14 07/11/2017   CREATININE 0.80 07/11/2017   CALCIUM 9.3 07/11/2017   PROT 7.2 07/11/2017   ALBUMIN 3.5 07/11/2017   AST 19 07/11/2017   ALT 21 07/11/2017   ALKPHOS 79 07/11/2017   BILITOT 0.4 07/11/2017   GFRNONAA >60 07/11/2017   GFRAA >60 07/11/2017    Lab Results  Component Value Date   WBC 6.5 07/11/2017   NEUTROABS 5.2 07/11/2017   HGB 10.2 (L) 07/11/2017   HCT 30.3 (L) 07/11/2017   MCV 87.2 07/11/2017   PLT 378 07/11/2017     STUDIES: Ct Abdomen  Pelvis W Contrast  Result Date: 06/19/2017 CLINICAL DATA:  Recent diagnosis of right upper outer quadrant breast cancer for which the patient is undergoing neoadjuvant chemotherapy. Patient presents with approximate 1 month history of left upper quadrant and left lower quadrant abdominal pain. EXAM: CT ABDOMEN AND PELVIS WITH CONTRAST TECHNIQUE: Multidetector CT imaging of the abdomen and pelvis was performed using the standard protocol following bolus administration of intravenous contrast. CONTRAST:  100 mL Isovue 370 IV. COMPARISON:  PET-CT 03/09/2017.  CT abdomen and pelvis 05/02/2012. FINDINGS: Lower chest: Heart size normal.  Visualized lung bases clear. Hepatobiliary: Liver normal in size and appearance. Gallbladder normal in appearance without calcified gallstones. No biliary ductal dilation. Pancreas: Normal in appearance without evidence of mass, ductal dilation, or inflammation. Spleen: Normal in size and appearance. Adrenals/Urinary Tract: Normal appearing adrenal glands. Kidneys normal in  size and appearance without focal parenchymal abnormality. No evidence of urinary tract calculi or obstruction. Normal-appearing urinary bladder. Stomach/Bowel: Stomach normal in appearance for the degree of distention. Normal-appearing small bowel. Descending and sigmoid colon diverticulosis with scattered diverticula in the transverse and ascending colon. Circumferential enhancing mass involving the proximal sigmoid colon extending over an approximate 6 cm length. Posterior to the mass is a thick walled fluid collection measuring approximately 2.4 x 2.4 x 3.1 cm. Mild edema/inflammation is present in the surrounding fat. Normal-appearing appendix in the right upper pelvis. Vascular/Lymphatic: Mild bilateral common iliac artery atherosclerosis. No visible aortic atherosclerosis. Normal-appearing portal venous and systemic venous systems. While normal in size, a lymph node in the left external iliac chain which measures approximately 1.0 x 0.8 cm is larger than the contralateral external iliac nodes; however, the node did not demonstrate hypermetabolic activity on the prior PET-CT. Scattered nodes are present in the fat adjacent to the sigmoid colon. No pathologic lymphadenopathy elsewhere in the abdomen or pelvis. Reproductive: Normal appearing uterus. Approximate 2.5 cm right ovarian cyst. No significant adnexal masses. Other: No ascites. Musculoskeletal: Degenerative disc disease at L3-4 and L4-5. Facet degenerative changes at L4-5 and L5-S1. Lower thoracic spondylosis. No acute osseous abnormality. No evidence of osseous metastatic disease. IMPRESSION: 1. Circumferential enhancing mass involving the proximal sigmoid colon extending over an approximate 6 cm length. The patient has sigmoid diverticulosis, and there is an approximate 3 cm thick walled abscess adjacent to the colonic mass. However, I believe this is more likely to represent a sigmoid colon malignancy with prior microperforation and development of a  walled off abscess (rather than the sequelae of diverticulitis). 2. No evidence of metastatic disease in the abdomen or pelvis. 3. No evidence of osseous metastatic disease. Electronically Signed   By: Evangeline Dakin M.D.   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  PLAN:   1. 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 Pretreatment MUGA revealed an EF of 59%. PET scan results reviewed independently with no obvious metastatic disease, but FDG positive area and her sigmoid colon. After the completion of her neoadjuvant chemotherapy, she will require definitive surgery and possibly adjuvant XRT. Finally, given the ER/PR status of her tumor she will benefit from either tamoxifen or an aromatase inhibitor for total 5 years.  Patient completed 4 cycles of Adriamycin and Cytoxan. Due to severity of peripheral neuropathy and patient's career, will discontinue Taxol at this time. Will refer back to surgery, Dr. Bary Castilla. Discussed post surgical XRT. Patient resides in [place] but works in [place]. Discussed possibly referring to XRT in [facility] or early AM scheduling. Return to clinic in 1 month for further evaluation and additional treatment planning. 2. Genetic testing: Negative.  3. Left upper and lower abdominal pain: Patient encouraged to follow up with PCP 4. Anemia: Stable, monitor. 5. Peripheral neuropathy: Discontinue Taxol as above. Fingers and left great toe today. Will clear her for 6 treatments of acupuncture for peripheral neuropathy. Ok to start gabapentin from PCP.   Patient expressed understanding and was  in agreement with this plan. She also understands that She can call clinic at any time with any questions, concerns, or complaints.   Cancer Staging Malignant neoplasm of upper-outer quadrant of right breast in female, estrogen receptor positive ([place]) Staging form: Breast, AJCC 8th Edition - Clinical stage from 03/05/2017: Stage IIA (cT2, cN2a, cM0, G2, ER: Positive, PR: Positive, HER2: Negative) - Signed by Lloyd Huger, MD on 03/19/2017  Beverely Risen. Zenia Resides, NP 07/11/17 10:22 AM  Patient was seen and evaluated independently and I agree with the assessment and plan as dictated above.  Lloyd Huger, MD 07/13/17 9:40 AM

## 2017-07-11 ENCOUNTER — Inpatient Hospital Stay: Payer: Medicaid Other

## 2017-07-11 ENCOUNTER — Inpatient Hospital Stay (HOSPITAL_BASED_OUTPATIENT_CLINIC_OR_DEPARTMENT_OTHER): Payer: Medicaid Other | Admitting: Oncology

## 2017-07-11 VITALS — BP 118/78 | HR 84 | Temp 97.2°F | Resp 18 | Wt 242.4 lb

## 2017-07-11 DIAGNOSIS — F17210 Nicotine dependence, cigarettes, uncomplicated: Secondary | ICD-10-CM | POA: Diagnosis not present

## 2017-07-11 DIAGNOSIS — N83201 Unspecified ovarian cyst, right side: Secondary | ICD-10-CM

## 2017-07-11 DIAGNOSIS — R1012 Left upper quadrant pain: Secondary | ICD-10-CM

## 2017-07-11 DIAGNOSIS — C50411 Malignant neoplasm of upper-outer quadrant of right female breast: Secondary | ICD-10-CM

## 2017-07-11 DIAGNOSIS — Z170 Estrogen receptor positive status [ER+]: Secondary | ICD-10-CM | POA: Diagnosis not present

## 2017-07-11 DIAGNOSIS — Z5111 Encounter for antineoplastic chemotherapy: Secondary | ICD-10-CM | POA: Diagnosis not present

## 2017-07-11 DIAGNOSIS — K651 Peritoneal abscess: Secondary | ICD-10-CM

## 2017-07-11 DIAGNOSIS — K5730 Diverticulosis of large intestine without perforation or abscess without bleeding: Secondary | ICD-10-CM

## 2017-07-11 DIAGNOSIS — D649 Anemia, unspecified: Secondary | ICD-10-CM

## 2017-07-11 DIAGNOSIS — Z803 Family history of malignant neoplasm of breast: Secondary | ICD-10-CM

## 2017-07-11 DIAGNOSIS — R1906 Epigastric swelling, mass or lump: Secondary | ICD-10-CM | POA: Diagnosis not present

## 2017-07-11 DIAGNOSIS — Z809 Family history of malignant neoplasm, unspecified: Secondary | ICD-10-CM

## 2017-07-11 DIAGNOSIS — G629 Polyneuropathy, unspecified: Secondary | ICD-10-CM

## 2017-07-11 DIAGNOSIS — M2550 Pain in unspecified joint: Secondary | ICD-10-CM | POA: Diagnosis not present

## 2017-07-11 DIAGNOSIS — I10 Essential (primary) hypertension: Secondary | ICD-10-CM

## 2017-07-11 DIAGNOSIS — I499 Cardiac arrhythmia, unspecified: Secondary | ICD-10-CM

## 2017-07-11 DIAGNOSIS — E669 Obesity, unspecified: Secondary | ICD-10-CM

## 2017-07-11 DIAGNOSIS — M5136 Other intervertebral disc degeneration, lumbar region: Secondary | ICD-10-CM

## 2017-07-11 LAB — CBC WITH DIFFERENTIAL/PLATELET
BASOS ABS: 0 10*3/uL (ref 0–0.1)
Basophils Relative: 0 %
EOS ABS: 0.1 10*3/uL (ref 0–0.7)
EOS PCT: 1 %
HCT: 30.3 % — ABNORMAL LOW (ref 35.0–47.0)
Hemoglobin: 10.2 g/dL — ABNORMAL LOW (ref 12.0–16.0)
LYMPHS PCT: 10 %
Lymphs Abs: 0.6 10*3/uL — ABNORMAL LOW (ref 1.0–3.6)
MCH: 29.4 pg (ref 26.0–34.0)
MCHC: 33.7 g/dL (ref 32.0–36.0)
MCV: 87.2 fL (ref 80.0–100.0)
MONO ABS: 0.5 10*3/uL (ref 0.2–0.9)
Monocytes Relative: 7 %
Neutro Abs: 5.2 10*3/uL (ref 1.4–6.5)
Neutrophils Relative %: 82 %
PLATELETS: 378 10*3/uL (ref 150–440)
RBC: 3.48 MIL/uL — AB (ref 3.80–5.20)
RDW: 20.5 % — AB (ref 11.5–14.5)
WBC: 6.5 10*3/uL (ref 3.6–11.0)

## 2017-07-11 LAB — COMPREHENSIVE METABOLIC PANEL
ALT: 21 U/L (ref 14–54)
AST: 19 U/L (ref 15–41)
Albumin: 3.5 g/dL (ref 3.5–5.0)
Alkaline Phosphatase: 79 U/L (ref 38–126)
Anion gap: 9 (ref 5–15)
BUN: 14 mg/dL (ref 6–20)
CHLORIDE: 102 mmol/L (ref 101–111)
CO2: 24 mmol/L (ref 22–32)
Calcium: 9.3 mg/dL (ref 8.9–10.3)
Creatinine, Ser: 0.8 mg/dL (ref 0.44–1.00)
Glucose, Bld: 149 mg/dL — ABNORMAL HIGH (ref 65–99)
POTASSIUM: 3.3 mmol/L — AB (ref 3.5–5.1)
SODIUM: 135 mmol/L (ref 135–145)
Total Bilirubin: 0.4 mg/dL (ref 0.3–1.2)
Total Protein: 7.2 g/dL (ref 6.5–8.1)

## 2017-07-11 MED ORDER — HEPARIN SOD (PORK) LOCK FLUSH 100 UNIT/ML IV SOLN
500.0000 [IU] | Freq: Once | INTRAVENOUS | Status: AC
  Administered 2017-07-11: 500 [IU] via INTRAVENOUS
  Filled 2017-07-11: qty 5

## 2017-07-11 MED ORDER — SODIUM CHLORIDE 0.9% FLUSH
10.0000 mL | Freq: Once | INTRAVENOUS | Status: AC
Start: 2017-07-11 — End: 2017-07-11
  Administered 2017-07-11: 10 mL via INTRAVENOUS
  Filled 2017-07-11: qty 10

## 2017-07-11 NOTE — Progress Notes (Signed)
Patient reports she still has numbness and tingling in her hands and left big toe. Patient denies other concerns today.

## 2017-07-11 NOTE — Progress Notes (Signed)
  Oncology Nurse Navigator Documentation  Navigator Location: [facility] (07/11/17 1000)   )Navigator Encounter Type: Clinic/MDC (07/11/17 1000)                       Treatment Phase: Active Tx (07/11/17 1000) Barriers/Navigation Needs: Coordination of Care (07/11/17 1000)   Interventions: Coordination of Care (07/11/17 1000)   Coordination of Care: Appts (Acupuncture) (07/11/17 1000)                  Time Spent with Patient: 45 (07/11/17 1000)   Patient experiencing CIPN.  Offered 6 sessions of Acupuncture as a Complementary therapy to be paid through [facility].   Patient is to contact Dr. Max Fickle to schedule appointments.  Given pre-and post acupuncture assessment.

## 2017-07-13 ENCOUNTER — Other Ambulatory Visit: Payer: Self-pay | Admitting: Nurse Practitioner

## 2017-07-13 DIAGNOSIS — G620 Drug-induced polyneuropathy: Secondary | ICD-10-CM

## 2017-07-13 DIAGNOSIS — T451X5A Adverse effect of antineoplastic and immunosuppressive drugs, initial encounter: Principal | ICD-10-CM

## 2017-07-13 MED ORDER — GABAPENTIN 100 MG PO CAPS: 100.0000 mg | ORAL_CAPSULE | Freq: Three times a day (TID) | ORAL | 3 refills | Status: DC

## 2017-07-13 NOTE — Progress Notes (Signed)
Confirmed ok to prescribe gabapentin for neuropathy management from oncology.  Chemo stopped for now. Pt informed.  Rx to be sent to [place]  Pt continues to have resolution of abdominal pain.

## 2017-07-25 ENCOUNTER — Inpatient Hospital Stay: Payer: Self-pay

## 2017-07-25 ENCOUNTER — Ambulatory Visit (INDEPENDENT_AMBULATORY_CARE_PROVIDER_SITE_OTHER): Payer: Medicaid Other | Admitting: General Surgery

## 2017-07-25 ENCOUNTER — Encounter: Payer: Self-pay | Admitting: General Surgery

## 2017-07-25 VITALS — BP 130/74 | HR 91 | Resp 16 | Ht 66.0 in | Wt 245.0 lb

## 2017-07-25 DIAGNOSIS — Z170 Estrogen receptor positive status [ER+]: Secondary | ICD-10-CM | POA: Diagnosis not present

## 2017-07-25 DIAGNOSIS — C50411 Malignant neoplasm of upper-outer quadrant of right female breast: Secondary | ICD-10-CM | POA: Diagnosis not present

## 2017-07-25 NOTE — Patient Instructions (Addendum)
The patient is aware to call back for any questions or concerns. Breast surgery with lymph node biopsy

## 2017-07-25 NOTE — Progress Notes (Signed)
Patient ID: Chelsea Hudson, female   DOB: Jun 06, 1966, 51 y.o.   MRN: [id number]  Chief Complaint  Patient presents with  . Follow-up    HPI Chelsea Hudson is a 51 y.o. female.  Here today for follow up right breast cancer and discuss surgery. She completed neoadjuvant chemotherapy about 3 weeks ago. August 2018 episode of abdominal pain with suspected possible colonic malignancy. Colonoscopy completed by Dr. Vicente Males..  She is on gabapentin for the neuropathy in her hands and toes. She is a DD bra size. She drives a dump truck.  HPI  Past Medical History:  Diagnosis Date  . Arthritis    SHOULDER  . Cancer ([place]) 02/28/2017   INVASIVE MAMMARY CARCINOMA WITH MUCINOUS FEATURES.  . Colonic mass 06/21/2017  . Hypertension   . Irregular heart beat    PT STATES IT "SKIPS A BEAT"   . Obesity     Past Surgical History:  Procedure Laterality Date  . ANKLE SURGERY    . BREAST BIOPSY Right 02/28/2017   INVASIVE MAMMARY CARCINOMA WITH MUCINOUS FEATURES.  Marland Kitchen BREAST CYST ASPIRATION Right    NEG  . COLONOSCOPY WITH PROPOFOL N/A 06/25/2017   Procedure: COLONOSCOPY WITH PROPOFOL;  Surgeon: Jonathon Bellows, MD;  Location: [facility] ENDOSCOPY;  Service: Gastroenterology;  Laterality: N/A;  . PORTACATH PLACEMENT Left 03/15/2017   Procedure: INSERTION PORT-A-CATH;  Surgeon: Robert Bellow, MD;  Location: [facility] ORS;  Service: General;  Laterality: Left;    Family History  Problem Relation Age of Onset  . Diabetes Father   . Stroke Father   . Breast cancer Mother 14  . Brain cancer Maternal Aunt 60  . Colon cancer Neg Hx     Social History Social History  Substance Use Topics  . Smoking status: Current Every Day Smoker    Packs/day: 0.25    Years: 25.00    Types: Cigarettes  . Smokeless tobacco: Never Used  . Alcohol use Yes     Comment: occas    Allergies  Allergen Reactions  . Other Hives and Itching    Patient states that she's allergic to an antibiotic but not sure which one. It was  given to her for infection     Current Outpatient Prescriptions  Medication Sig Dispense Refill  . baclofen (LIORESAL) 10 MG tablet Take 1 tablet (10 mg total) by mouth 3 (three) times daily. (Patient not taking: Reported on 07/25/2017) 30 each 0  . diphenhydrAMINE (BENADRYL) 25 MG tablet Take 1 tablet (25 mg total) by mouth at bedtime as needed for sleep. 30 tablet 0  . gabapentin (NEURONTIN) 100 MG capsule Take 1 capsule (100 mg total) by mouth 3 (three) times daily. OR 3 tablets once daily at bedtime. (Patient taking differently: Take 300 mg by mouth at bedtime. OR 3 tablets once daily at bedtime.) 90 capsule 3  . lidocaine-prilocaine (EMLA) cream Apply to affected area once (Patient taking differently: Apply 1 application topically as needed (befor Dr appointments). Apply to affected area once) 30 g 3  . magic mouthwash w/lidocaine SOLN Take 5 mLs by mouth 4 (four) times daily as needed for mouth pain. (Patient not taking: Reported on 07/25/2017) 480 mL 3  . ondansetron (ZOFRAN) 8 MG tablet Take 1 tablet (8 mg total) by mouth 2 (two) times daily as needed. (Patient not taking: Reported on 07/25/2017) 60 tablet 3  . prochlorperazine (COMPAZINE) 10 MG tablet Take 1 tablet (10 mg total) by mouth every 6 (six) hours as needed (Nausea  or vomiting). (Patient not taking: Reported on 07/25/2017) 60 tablet 3  . triamterene-hydrochlorothiazide (MAXZIDE-25) 37.5-25 MG tablet Take 1 tablet by mouth daily. (Patient taking differently: Take 1 tablet by mouth at bedtime. ) 90 tablet 1   No current facility-administered medications for this visit.     Review of Systems Review of Systems  Constitutional: Negative.   Respiratory: Negative.   Cardiovascular: Negative.     Blood pressure 130/74, pulse 91, resp. rate 16, height '5\' 6"'$  (1.676 m), weight 245 lb (111.1 kg), last menstrual period 01/31/2014.  Physical Exam Physical Exam  Constitutional: She is oriented to person, place, and time. She appears  well-developed and well-nourished.  HENT:  Mouth/Throat: Oropharynx is clear and moist.  Eyes: Conjunctivae are normal. No scleral icterus.  Neck: Neck supple.  Cardiovascular: Normal rate, regular rhythm and normal heart sounds.   Mild lower leg edema  Pulmonary/Chest: Effort normal and breath sounds normal. Right breast exhibits no inverted nipple, no mass, no nipple discharge, no skin change and no tenderness. Left breast exhibits no inverted nipple, no mass, no nipple discharge, no skin change and no tenderness.    thickening 11-12 o'clock right breast with mild dimpling   Lymphadenopathy:    She has no cervical adenopathy.    She has no axillary adenopathy.  Neurological: She is alert and oriented to person, place, and time.  Skin: Skin is warm and dry.  Psychiatric: Her behavior is normal.    Data Reviewed Colonoscopy dated 06/25/2017 completed by Dr. Vicente Males showed evidence of a diverticular stricture but no evidence of malignancy.  Biopsy results:  DIAGNOSIS:  A. COLON, RED AREA IN SIGMOID; COLD BIOPSY:  - MELANOSIS COLI.  - FOCAL MILD ACTIVE COLITIS.  - NEGATIVE FOR DYSPLASIA AND MALIGNANCY.   Comment:  The mucosal discoloration is likely due to melanosis coli. Focal active  colitis is identified and may be attributed to adjacent to diverticular  disease or mild ischemia.    02/28/2017 breast/axillary node biopsy results:  DIAGNOSIS:  A. RIGHT BREAST, 11:00, 8 CMFN; BIOPSY:  - INVASIVE MAMMARY CARCINOMA WITH MUCINOUS FEATURES.   Size of invasive carcinoma: 1.4 cm in this sample  Histologic grade of invasive carcinoma: Grade 2    Glandular/tubular differentiation score: 3    Nuclear pleomorphism score: 3    Mitotic rate score: 1   ER/PR/HER2: Immunohistochemistry will be performed on block A1, with  reflex to [place] for HER2 2+. The results will be reported in an addendum.   B. LYMPH NODE, RIGHT AXILLARY; BIOPSY:  - METASTATIC MAMMARY CARCINOMA.     Ultrasound examination of the right breast in the 12:00 position 8 cm from the nipple below the area of focal tethering of the skin showed a residual mass measuring 0.87 x 1.0 x 1.5 cm. This is 0.96 cm below the skin. This corresponds with the previously placed biopsy clip by the radiology service. BI-RADS-6.  Examination of the axilla shows multiple enlarged nodes measuring up to 1.69 cm in diameter. 2 dominant nodes appreciated. Previously placed height mark clip not clearly evident.  Assessment    Excellent clinical response to neoadjuvant chemotherapy.    Plan    Options for management included mastectomy versus breast conservation. The patient is strongly and shifted in breast conservation. Indication for sentinel node biopsy reviewed. Possibility of axillary dissection if residual disease is present discussed.    HPI, Physical Exam, Assessment and Plan have been scribed under the direction and in the presence of Forest Gleason.  Bary Castilla, MD. Karie Fetch, RN  I have completed the exam and reviewed the above documentation for accuracy and completeness.  I agree with the above.  Haematologist has been used and any errors in dictation or transcription are unintentional.  Hervey Ard, M.D., F.A.C.S.  Robert Bellow 07/25/2017, 2:58 PM  Patient's surgery has been scheduled for 07-30-17 at [facility].   Dominga Ferry, CMA

## 2017-07-27 ENCOUNTER — Telehealth: Payer: Self-pay | Admitting: *Deleted

## 2017-07-27 ENCOUNTER — Encounter
Admission: RE | Admit: 2017-07-27 | Discharge: 2017-07-27 | Disposition: A | Discharge status: Home / Self Care | Payer: Medicaid Other | Source: Ambulatory Visit | Attending: General Surgery | Admitting: General Surgery

## 2017-07-27 HISTORY — DX: Personal history of antineoplastic chemotherapy: Z92.21

## 2017-07-27 NOTE — Patient Instructions (Signed)
Your procedure is scheduled on: 07-30-17 Report to [facility] (2ND DESK ON RIGHT) @ 10:00 AM PER PT   Remember: Instructions that are not followed completely may result in serious medical risk, up to and including death, or upon the discretion of your surgeon and anesthesiologist your surgery may need to be rescheduled.    _x___ 1. Do not eat food after midnight the night before your procedure. You may drink clear liquids up to 2 hours before you are scheduled to arrive at the hospital for your procedure.  CLEAR LIQUIDS NEED TO BE IN BY AM ON Monday . Do not drink clear liquids within 2 hours of your scheduled arrival to the hospital.  Clear liquids include  --Water or Apple juice without pulp  --Clear carbohydrate beverage such as ClearFast or Gatorade  --Black Coffee or Clear Tea (No milk, no creamers, do not add anything to the coffee or Tea Type 1 and type 2 diabetics should only drink water.  No gum chewing or hard candies.     __x__ 2. No Alcohol for 24 hours before or after surgery.   __x__3. No Smoking for 24 prior to surgery.   ____  4. Bring all medications with you on the day of surgery if instructed.    __x__ 5. Notify your doctor if there is any change in your medical condition     (cold, fever, infections).     Do not wear jewelry, make-up, hairpins, clips or nail polish.  Do not wear lotions, powders, or perfumes. You may wear deodorant.  Do not shave 48 hours prior to surgery. Men may shave face and neck.  Do not bring valuables to the hospital.    [facility] is not responsible for any belongings or valuables.               Contacts, dentures or bridgework may not be worn into surgery.  Leave your suitcase in the car. After surgery it may be brought to your room.  For patients admitted to the hospital, discharge time is determined by your                       treatment team.   Patients discharged the day of surgery will not be allowed to drive  home.  You will need someone to drive you home and stay with you the night of your procedure.    Please read over the following fact sheets that you were given:   [facility] Preparing for Surgery and or MRSA Information   ____ Take anti-hypertensive listed below, cardiac, seizure, asthma,     anti-reflux and psychiatric medicines. These include:  1. NONE  2.  3.  4.  5.  6.  ____Fleets enema or Magnesium Citrate as directed.   ____ Use CHG Soap or sage wipes as directed on instruction sheet   ____ Use inhalers on the day of surgery and bring to hospital day of surgery  ____ Stop Metformin and Janumet 2 days prior to surgery.    ____ Take 1/2 of usual insulin dose the night before surgery and none on the morning     surgery.   ____ Follow recommendations from Cardiologist, Pulmonologist or PCP regarding stopping Aspirin, Coumadin, Plavix ,Eliquis, Effient, or Pradaxa, and Pletal.  ____Stop Anti-inflammatories such as Advil, Aleve, Ibuprofen, Motrin, Naproxen, Naprosyn, Goodies powders or aspirin products -OK to take Tylenol    ____ Stop supplements until after surgery.  ____ Bring C-Pap to the hospital.

## 2017-07-27 NOTE — Telephone Encounter (Signed)
Called potassium chloride, 20 mEq tablets, #20 with the inscription 1 tablet twice a day. [facility]. Notified patient as instructed.patient agrees

## 2017-07-29 MED ORDER — CEFAZOLIN SODIUM-DEXTROSE 2-4 GM/100ML-% IV SOLN
2.0000 g | INTRAVENOUS | Status: AC
  Administered 2017-07-30: 2 g via INTRAVENOUS

## 2017-07-30 ENCOUNTER — Encounter: Payer: Self-pay | Admitting: *Deleted

## 2017-07-30 ENCOUNTER — Ambulatory Visit: Payer: Medicaid Other

## 2017-07-30 ENCOUNTER — Ambulatory Visit: Payer: Medicaid Other | Admitting: Certified Registered"

## 2017-07-30 ENCOUNTER — Ambulatory Visit
Admission: RE | Admit: 2017-07-30 | Discharge: 2017-07-30 | Disposition: A | Discharge status: Home / Self Care | Payer: Medicaid Other | Source: Ambulatory Visit | Attending: General Surgery | Admitting: General Surgery

## 2017-07-30 ENCOUNTER — Encounter
Admission: RE | Disposition: A | Discharge status: Home / Self Care | Payer: Self-pay | Source: Ambulatory Visit | Attending: General Surgery

## 2017-07-30 DIAGNOSIS — Z6839 Body mass index (BMI) 39.0-39.9, adult: Secondary | ICD-10-CM | POA: Diagnosis not present

## 2017-07-30 DIAGNOSIS — Z9221 Personal history of antineoplastic chemotherapy: Secondary | ICD-10-CM | POA: Diagnosis not present

## 2017-07-30 DIAGNOSIS — Z823 Family history of stroke: Secondary | ICD-10-CM | POA: Diagnosis not present

## 2017-07-30 DIAGNOSIS — C50411 Malignant neoplasm of upper-outer quadrant of right female breast: Secondary | ICD-10-CM | POA: Diagnosis not present

## 2017-07-30 DIAGNOSIS — C779 Secondary and unspecified malignant neoplasm of lymph node, unspecified: Secondary | ICD-10-CM | POA: Diagnosis not present

## 2017-07-30 DIAGNOSIS — Z803 Family history of malignant neoplasm of breast: Secondary | ICD-10-CM | POA: Diagnosis not present

## 2017-07-30 DIAGNOSIS — Z79899 Other long term (current) drug therapy: Secondary | ICD-10-CM | POA: Insufficient documentation

## 2017-07-30 DIAGNOSIS — I10 Essential (primary) hypertension: Secondary | ICD-10-CM | POA: Insufficient documentation

## 2017-07-30 DIAGNOSIS — N6310 Unspecified lump in the right breast, unspecified quadrant: Secondary | ICD-10-CM

## 2017-07-30 DIAGNOSIS — Z808 Family history of malignant neoplasm of other organs or systems: Secondary | ICD-10-CM | POA: Insufficient documentation

## 2017-07-30 DIAGNOSIS — C50811 Malignant neoplasm of overlapping sites of right female breast: Secondary | ICD-10-CM | POA: Diagnosis not present

## 2017-07-30 DIAGNOSIS — E669 Obesity, unspecified: Secondary | ICD-10-CM | POA: Insufficient documentation

## 2017-07-30 DIAGNOSIS — Z170 Estrogen receptor positive status [ER+]: Secondary | ICD-10-CM

## 2017-07-30 DIAGNOSIS — F17210 Nicotine dependence, cigarettes, uncomplicated: Secondary | ICD-10-CM | POA: Diagnosis not present

## 2017-07-30 DIAGNOSIS — Z833 Family history of diabetes mellitus: Secondary | ICD-10-CM | POA: Insufficient documentation

## 2017-07-30 HISTORY — PX: MASTECTOMY, PARTIAL: SHX709

## 2017-07-30 HISTORY — PX: SENTINEL NODE BIOPSY: SHX6608

## 2017-07-30 LAB — POCT I-STAT 4, (NA,K, GLUC, HGB,HCT)
Glucose, Bld: 106 mg/dL — ABNORMAL HIGH (ref 65–99)
HCT: 35 % — ABNORMAL LOW (ref 36.0–46.0)
HEMOGLOBIN: 11.9 g/dL — AB (ref 12.0–15.0)
POTASSIUM: 3.9 mmol/L (ref 3.5–5.1)
Sodium: 140 mmol/L (ref 135–145)

## 2017-07-30 LAB — POCT PREGNANCY, URINE: Preg Test, Ur: NEGATIVE

## 2017-07-30 SURGERY — MASTECTOMY PARTIAL
Anesthesia: General | Laterality: Right | Wound class: Clean

## 2017-07-30 MED ORDER — FENTANYL CITRATE (PF) 100 MCG/2ML IJ SOLN
INTRAMUSCULAR | Status: AC
  Administered 2017-07-30: 25 ug via INTRAVENOUS
  Filled 2017-07-30: qty 2

## 2017-07-30 MED ORDER — MIDAZOLAM HCL 2 MG/2ML IJ SOLN
INTRAMUSCULAR | Status: DC | PRN
  Administered 2017-07-30: 2 mg via INTRAVENOUS

## 2017-07-30 MED ORDER — FENTANYL CITRATE (PF) 100 MCG/2ML IJ SOLN
INTRAMUSCULAR | Status: DC | PRN
  Administered 2017-07-30: 100 ug via INTRAVENOUS
  Administered 2017-07-30 (×4): 50 ug via INTRAVENOUS

## 2017-07-30 MED ORDER — FAMOTIDINE 20 MG PO TABS
20.0000 mg | ORAL_TABLET | Freq: Once | ORAL | Status: AC
  Administered 2017-07-30: 20 mg via ORAL

## 2017-07-30 MED ORDER — BUPIVACAINE-EPINEPHRINE (PF) 0.5% -1:200000 IJ SOLN
INTRAMUSCULAR | Status: AC
  Filled 2017-07-30: qty 30

## 2017-07-30 MED ORDER — METHYLENE BLUE 0.5 % INJ SOLN
INTRAVENOUS | Status: DC | PRN
  Administered 2017-07-30: 5 mL via SUBMUCOSAL

## 2017-07-30 MED ORDER — HYDROCODONE-ACETAMINOPHEN 5-325 MG PO TABS
ORAL_TABLET | ORAL | Status: AC
  Filled 2017-07-30: qty 1

## 2017-07-30 MED ORDER — ONDANSETRON HCL 4 MG/2ML IJ SOLN: 4.0000 mg | Freq: Once | INTRAMUSCULAR | Status: DC | PRN

## 2017-07-30 MED ORDER — DEXAMETHASONE SODIUM PHOSPHATE 10 MG/ML IJ SOLN
INTRAMUSCULAR | Status: DC | PRN
  Administered 2017-07-30: 5 mg via INTRAVENOUS

## 2017-07-30 MED ORDER — BUPIVACAINE-EPINEPHRINE (PF) 0.5% -1:200000 IJ SOLN
INTRAMUSCULAR | Status: DC | PRN
  Administered 2017-07-30: 30 mL via PERINEURAL

## 2017-07-30 MED ORDER — LIDOCAINE HCL (PF) 2 % IJ SOLN
INTRAMUSCULAR | Status: AC
  Filled 2017-07-30: qty 2

## 2017-07-30 MED ORDER — PROPOFOL 10 MG/ML IV BOLUS
INTRAVENOUS | Status: AC
  Filled 2017-07-30: qty 20

## 2017-07-30 MED ORDER — METHYLENE BLUE 0.5 % INJ SOLN
INTRAVENOUS | Status: AC
  Filled 2017-07-30: qty 10

## 2017-07-30 MED ORDER — SUCCINYLCHOLINE CHLORIDE 20 MG/ML IJ SOLN
INTRAMUSCULAR | Status: DC | PRN
  Administered 2017-07-30: 100 mg via INTRAVENOUS

## 2017-07-30 MED ORDER — FAMOTIDINE 20 MG PO TABS
ORAL_TABLET | ORAL | Status: AC
  Administered 2017-07-30: 20 mg via ORAL
  Filled 2017-07-30: qty 1

## 2017-07-30 MED ORDER — ONDANSETRON HCL 4 MG/2ML IJ SOLN
INTRAMUSCULAR | Status: AC
  Filled 2017-07-30: qty 2

## 2017-07-30 MED ORDER — FENTANYL CITRATE (PF) 100 MCG/2ML IJ SOLN
INTRAMUSCULAR | Status: AC
  Filled 2017-07-30: qty 2

## 2017-07-30 MED ORDER — PROPOFOL 10 MG/ML IV BOLUS
INTRAVENOUS | Status: DC | PRN
  Administered 2017-07-30: 200 mg via INTRAVENOUS

## 2017-07-30 MED ORDER — KETOROLAC TROMETHAMINE 30 MG/ML IJ SOLN
INTRAMUSCULAR | Status: DC | PRN
Start: 2017-07-30 — End: 2017-07-30
  Administered 2017-07-30: 30 mg via INTRAVENOUS

## 2017-07-30 MED ORDER — HYDROCODONE-ACETAMINOPHEN 5-325 MG PO TABS: 1.0000 | ORAL_TABLET | ORAL | 0 refills | Status: DC | PRN

## 2017-07-30 MED ORDER — KETAMINE HCL 50 MG/ML IJ SOLN
INTRAMUSCULAR | Status: AC
  Filled 2017-07-30: qty 10

## 2017-07-30 MED ORDER — TECHNETIUM TC 99M SULFUR COLLOID FILTERED
1.0000 | Freq: Once | INTRAVENOUS | Status: AC | PRN
  Administered 2017-07-30: 0.848 via INTRADERMAL

## 2017-07-30 MED ORDER — ACETAMINOPHEN 10 MG/ML IV SOLN
INTRAVENOUS | Status: AC
  Filled 2017-07-30: qty 100

## 2017-07-30 MED ORDER — LACTATED RINGERS IV SOLN
INTRAVENOUS | Status: DC
  Administered 2017-07-30: 11:00:00 via INTRAVENOUS

## 2017-07-30 MED ORDER — ACETAMINOPHEN 10 MG/ML IV SOLN
INTRAVENOUS | Status: DC | PRN
  Administered 2017-07-30: 1000 mg via INTRAVENOUS

## 2017-07-30 MED ORDER — KETAMINE HCL 50 MG/ML IJ SOLN
INTRAMUSCULAR | Status: DC | PRN
  Administered 2017-07-30: 50 mg via INTRAVENOUS

## 2017-07-30 MED ORDER — ONDANSETRON HCL 4 MG/2ML IJ SOLN
INTRAMUSCULAR | Status: DC | PRN
  Administered 2017-07-30: 4 mg via INTRAVENOUS

## 2017-07-30 MED ORDER — GLYCOPYRROLATE 0.2 MG/ML IJ SOLN
INTRAMUSCULAR | Status: DC | PRN
  Administered 2017-07-30: 0.2 mg via INTRAVENOUS

## 2017-07-30 MED ORDER — HYDROCODONE-ACETAMINOPHEN 5-325 MG PO TABS
1.0000 | ORAL_TABLET | ORAL | Status: DC | PRN
  Administered 2017-07-30: 1 via ORAL

## 2017-07-30 MED ORDER — FENTANYL CITRATE (PF) 100 MCG/2ML IJ SOLN
25.0000 ug | INTRAMUSCULAR | Status: DC | PRN
  Administered 2017-07-30 (×3): 25 ug via INTRAVENOUS

## 2017-07-30 MED ORDER — GLYCOPYRROLATE 0.2 MG/ML IJ SOLN
INTRAMUSCULAR | Status: AC
  Filled 2017-07-30: qty 1

## 2017-07-30 MED ORDER — LIDOCAINE HCL (CARDIAC) 20 MG/ML IV SOLN
INTRAVENOUS | Status: DC | PRN
  Administered 2017-07-30: 50 mg via INTRAVENOUS

## 2017-07-30 MED ORDER — MIDAZOLAM HCL 2 MG/2ML IJ SOLN
INTRAMUSCULAR | Status: AC
  Filled 2017-07-30: qty 2

## 2017-07-30 MED ORDER — CEFAZOLIN SODIUM-DEXTROSE 2-4 GM/100ML-% IV SOLN
INTRAVENOUS | Status: AC
  Filled 2017-07-30: qty 100

## 2017-07-30 SURGICAL SUPPLY — 54 items
BANDAGE ELASTIC 6 LF NS (GAUZE/BANDAGES/DRESSINGS) IMPLANT
BLADE SURG 15 STRL SS SAFETY (BLADE) ×4 IMPLANT
BNDG GAUZE 4.5X4.1 6PLY STRL (MISCELLANEOUS) IMPLANT
BULB RESERV EVAC DRAIN JP 100C (MISCELLANEOUS) ×2 IMPLANT
CANISTER SUCT 1200ML W/VALVE (MISCELLANEOUS) ×2 IMPLANT
CHLORAPREP W/TINT 26ML (MISCELLANEOUS) ×2 IMPLANT
CNTNR SPEC 2.5X3XGRAD LEK (MISCELLANEOUS) ×1
CONT SPEC 4OZ STER OR WHT (MISCELLANEOUS) ×1
CONTAINER SPEC 2.5X3XGRAD LEK (MISCELLANEOUS) ×1 IMPLANT
COVER PROBE FLX POLY STRL (MISCELLANEOUS) ×2 IMPLANT
DEVICE DUBIN SPECIMEN MAMMOGRA (MISCELLANEOUS) ×2 IMPLANT
DRAIN CHANNEL JP 15F RND 16 (MISCELLANEOUS) ×2 IMPLANT
DRAPE LAPAROTOMY 100X77 ABD (DRAPES) IMPLANT
DRAPE LAPAROTOMY TRNSV 106X77 (MISCELLANEOUS) ×2 IMPLANT
DRSG TEGADERM 4X4.75 (GAUZE/BANDAGES/DRESSINGS) ×2 IMPLANT
DRSG TELFA 3X8 NADH (GAUZE/BANDAGES/DRESSINGS) ×6 IMPLANT
DRSG TELFA 4X3 1S NADH ST (GAUZE/BANDAGES/DRESSINGS) ×4 IMPLANT
ELECT CAUTERY BLADE TIP 2.5 (TIP) ×2
ELECT EZSTD 165MM 6.5IN (MISCELLANEOUS) ×2
ELECT REM PT RETURN 9FT ADLT (ELECTROSURGICAL) ×2
ELECTRODE CAUTERY BLDE TIP 2.5 (TIP) ×1 IMPLANT
ELECTRODE EZSTD 165MM 6.5IN (MISCELLANEOUS) ×1 IMPLANT
ELECTRODE REM PT RTRN 9FT ADLT (ELECTROSURGICAL) ×1 IMPLANT
GAUZE FLUFF 18X24 1PLY STRL (GAUZE/BANDAGES/DRESSINGS) ×4 IMPLANT
GAUZE SPONGE 4X4 12PLY STRL (GAUZE/BANDAGES/DRESSINGS) IMPLANT
GLOVE BIO SURGEON STRL SZ7.5 (GLOVE) ×4 IMPLANT
GLOVE INDICATOR 8.0 STRL GRN (GLOVE) ×4 IMPLANT
GOWN STRL REUS W/ TWL LRG LVL3 (GOWN DISPOSABLE) ×2 IMPLANT
GOWN STRL REUS W/TWL LRG LVL3 (GOWN DISPOSABLE) ×2
KIT RM TURNOVER STRD PROC AR (KITS) ×2 IMPLANT
LABEL OR SOLS (LABEL) ×2 IMPLANT
MARGIN MAP 10MM (MISCELLANEOUS) ×2 IMPLANT
NDL SAFETY 18GX1.5 (NEEDLE) ×2 IMPLANT
NDL SAFETY 22GX1.5 (NEEDLE) ×2 IMPLANT
NEEDLE HYPO 25X1 1.5 SAFETY (NEEDLE) ×4 IMPLANT
PACK BASIN MINOR ARMC (MISCELLANEOUS) ×2 IMPLANT
SHEARS FOC LG CVD HARMONIC 17C (MISCELLANEOUS) ×2 IMPLANT
SHEARS HARMONIC 9CM CVD (BLADE) IMPLANT
SLEVE PROBE SENORX GAMMA FIND (MISCELLANEOUS) ×2 IMPLANT
STRIP CLOSURE SKIN 1/2X4 (GAUZE/BANDAGES/DRESSINGS) ×2 IMPLANT
SUT ETHILON 3-0 FS-10 30 BLK (SUTURE) ×2
SUT SILK 2 0 (SUTURE) ×1
SUT SILK 2-0 18XBRD TIE 12 (SUTURE) ×1 IMPLANT
SUT VIC AB 2-0 CT1 27 (SUTURE) ×3
SUT VIC AB 2-0 CT1 TAPERPNT 27 (SUTURE) ×3 IMPLANT
SUT VIC AB 4-0 FS2 27 (SUTURE) ×2 IMPLANT
SUT VICRYL+ 3-0 144IN (SUTURE) ×2 IMPLANT
SUTURE EHLN 3-0 FS-10 30 BLK (SUTURE) ×1 IMPLANT
SWABSTK COMLB BENZOIN TINCTURE (MISCELLANEOUS) ×2 IMPLANT
SYR BULB IRRIG 60ML STRL (SYRINGE) ×2 IMPLANT
SYR CONTROL 10ML (SYRINGE) ×2 IMPLANT
SYRINGE 10CC LL (SYRINGE) ×2 IMPLANT
TAPE TRANSPORE STRL 2 31045 (GAUZE/BANDAGES/DRESSINGS) ×2 IMPLANT
WATER STERILE IRR 1000ML POUR (IV SOLUTION) ×2 IMPLANT

## 2017-07-30 NOTE — Op Note (Signed)
Preoperative diagnosis: Right breast cancer status post neoadjuvant chemotherapy, desire for breast conservation.  Postoperative diagnosis: Same, residual axillary and breast disease.  Operation: Right axillary biopsy, axillary dissection; wide excision right breast, reexcision inferior margin, mastoplasty, ultrasound guidance.  Operating surgeon: Ollen Bowl, M.D.  Anesthesia: Gen. Endotracheal, Marcaine 0.5% with 1-200,000 epinephrine, 30 mL.  Estimated blood loss: Less than 30 mL.  Drains: 15 French Blake drain in axilla.  Clinical note: This 51 year old woman presented with a large mass in the central upper breast with tethering of the overlying skin. Biopsy showed invasive cancer with positive axillary disease. She has undergone neoadjuvant chemotherapy with significant clinical improvement but a suggestion of a residual foci in the breast as well as enlarged axillary lymph nodes. She underwent injection with technetium prior to surgery with plans for a sentinel node biopsy and frozen section. Plans for axillary dissection if these glands were positive for residual disease.  Operative note: The patient underwent general endotracheal anesthesia without difficulty. The perirenal looked tissue was cleansed with alcohol and 5 mL of 0.5% methylene blue was instilled in the subareolar plexus. Scanning through the axilla showed no counts suggesting obstruction of the lymphatics. Attention was turned towards the axilla and ultrasound used to identify the previously noted enlarged glands. A transverse incision was made in this area after installation of local anesthesia for postoperative comfort. This skin was incised sharply and the remaining dissection completed with electrocautery. 3 large 1-1.5 cm, firm nodes were removed and touch preps were positive for macro metastatic disease. While this report was pending attention had been turned to the breast. Ultrasound was used to identify the site of  previous clip placement. With persistent tethering of the skin it was elected to resect a 3 x 9 cm piece of skin at the 12:00 position about 8 cm above the nipple with the idea this would include all areas of residual disease. The skin was incised sharply after injection of local anesthesia.The inferior edge was suspicious for residual disease and this was indeed confirmed by the pathologist to felt that mass did abut the inferior margin.  While the breast specimen was being reviewed  Axillary dissection was completed.Harmonic scalpel and electrocautery used for hemostasis as well as 2-0 silk ties. The axillary ampulla but previously been open for the node biopsy and this was extended up to the level of the axillary vein. The dissection was carried out below the axillary vein. This was a level I and level II node dissection. The long thoracic nerve of Bell and thoracodorsal nerve artery vein bundle were identified and protected. Both nerves were functional at the end of the procedure. The axillary tissue was swept inferior and laterally and removed and sent in formalin for routine histology. Good hemostasis was noted. A 15-French Blake drain was brought out through a stab wound incision and anchored in place with a 3-0 nylon suture. The axillary tissue was then approximated with layers of 2-0 Vicryl figure-of-eight sutures. The skin was closed with a running 4-0 Vicryl subcuticular suture.  Attention was turned back to the breast. A 1.5 cm block of tissue encompassing a new inferior border was excised through the original incision. This was orientated and sent in formalin for routine histology.  The volume defect involved the upper aspect of the breast. To close this the upper breast tissue was mobilized off the underlying pectoralis fascia and then brought as a pants over vest coverage to the lower tissue with interrupted 2-0 Vicryl figure-of-eight sutures. A  second layer of sutures are then used to finish  tissue approximation. The skin was closed with a running 3-0 Vicryl subcuticular suture. Benzoin, Steri-Strips followed by Telfa, fluffed gauze and surgical bra was applied. Tegaderm was placed over the drain site.  The patient tolerated the procedure well was taken to the recovery room in stable condition.  The procedure was extended due to the need for biopsy and completion axillary dissection as well as reexcision of the inferior margin. Operative time approximately 2 hours and 30 minutes.

## 2017-07-30 NOTE — OR Nursing (Signed)
Dr. Bary Castilla in to see pt 510 pm

## 2017-07-30 NOTE — Discharge Instructions (Signed)
AMBULATORY SURGERY  °DISCHARGE INSTRUCTIONS ° ° °1) The drugs that you were given will stay in your system until tomorrow so for the next 24 hours you should not: ° °A) Drive an automobile °B) Make any legal decisions °C) Drink any alcoholic beverage ° ° °2) You may resume regular meals tomorrow.  Today it is better to start with liquids and gradually work up to solid foods. ° °You may eat anything you prefer, but it is better to start with liquids, then soup and crackers, and gradually work up to solid foods. ° ° °3) Please notify your doctor immediately if you have any unusual bleeding, trouble breathing, redness and pain at the surgery site, drainage, fever, or pain not relieved by medication. ° ° ° °4) Additional Instructions: ° ° ° ° ° ° ° °Please contact your physician with any problems or Same Day Surgery at [phone], Monday through Friday 6 am to 4 pm, or [place] [facility] [place] Main number at [phone]. °

## 2017-07-30 NOTE — Transfer of Care (Signed)
Immediate Anesthesia Transfer of Care Note  Patient: Chelsea Hudson  Procedure(s) Performed: Procedure(s): MASTECTOMY PARTIAL (Right) SENTINEL NODE BIOPSY (Right)  Patient Location: PACU  Anesthesia Type:General  Level of Consciousness: awake  Airway & Oxygen Therapy: Patient Spontanous Breathing and Patient connected to face mask oxygen  Post-op Assessment: Report given to RN and Post -op Vital signs reviewed and stable  Post vital signs: Reviewed  Last Vitals:  Vitals:   07/30/17 1117 07/30/17 1530  BP: 119/74 129/79  Pulse: 72 86  Resp: 16 15  Temp: 36.9 C 36.7 C  SpO2: 100% 97%    Last Pain:  Vitals:   07/30/17 1117  TempSrc: Oral         Complications: No apparent anesthesia complications

## 2017-07-30 NOTE — Anesthesia Post-op Follow-up Note (Signed)
Anesthesia QCDR form completed.        

## 2017-07-30 NOTE — H&P (Signed)
No change in clinical exam or history. For right wide excision and SLN biopsy, possible axillary dissection.

## 2017-07-30 NOTE — Anesthesia Procedure Notes (Signed)
Procedure Name: Intubation Performed by: Maliya Marich Pre-anesthesia Checklist: Patient identified, Patient being monitored, Timeout performed, Emergency Drugs available and Suction available Patient Re-evaluated:Patient Re-evaluated prior to induction Oxygen Delivery Method: Circle system utilized Preoxygenation: Pre-oxygenation with 100% oxygen Induction Type: IV induction and Rapid sequence Ventilation: Mask ventilation without difficulty Laryngoscope Size: Miller and 2 Grade View: Grade I Tube type: Oral Tube size: 7.0 mm Number of attempts: 1 Airway Equipment and Method: Stylet Placement Confirmation: ETT inserted through vocal cords under direct vision,  positive ETCO2 and breath sounds checked- equal and bilateral Secured at: 21 cm Tube secured with: Tape Dental Injury: Teeth and Oropharynx as per pre-operative assessment        

## 2017-07-30 NOTE — Anesthesia Preprocedure Evaluation (Signed)
Anesthesia Evaluation  Patient identified by MRN, date of birth, ID band Patient awake    Reviewed: Allergy & Precautions, NPO status , Patient's Chart, lab work & pertinent test results  History of Anesthesia Complications Negative for: history of anesthetic complications  Airway Mallampati: III       Dental  (+) Edentulous Upper   Pulmonary neg sleep apnea, neg COPD, Current Smoker,           Cardiovascular hypertension, Pt. on medications      Neuro/Psych    GI/Hepatic Neg liver ROS, neg GERD  ,  Endo/Other  neg diabetes  Renal/GU negative Renal ROS     Musculoskeletal   Abdominal   Peds  Hematology   Anesthesia Other Findings   Reproductive/Obstetrics                             Anesthesia Physical Anesthesia Plan  ASA: III  Anesthesia Plan: General   Post-op Pain Management:    Induction:   PONV Risk Score and Plan: 2 and Ondansetron and Dexamethasone  Airway Management Planned: Oral ETT  Additional Equipment:   Intra-op Plan:   Post-operative Plan:   Informed Consent: I have reviewed the patients History and Physical, chart, labs and discussed the procedure including the risks, benefits and alternatives for the proposed anesthesia with the patient or authorized representative who has indicated his/her understanding and acceptance.     Plan Discussed with:   Anesthesia Plan Comments:         Anesthesia Quick Evaluation

## 2017-07-31 ENCOUNTER — Encounter: Payer: Self-pay | Admitting: General Surgery

## 2017-08-01 NOTE — Anesthesia Postprocedure Evaluation (Signed)
Anesthesia Post Note  Patient: Chelsea Hudson  Procedure(s) Performed: Procedure(s) (LRB): MASTECTOMY PARTIAL (Right) SENTINEL NODE BIOPSY (Right)  Patient location during evaluation: PACU Anesthesia Type: General Level of consciousness: awake and alert and oriented Pain management: pain level controlled Vital Signs Assessment: post-procedure vital signs reviewed and stable Respiratory status: spontaneous breathing Cardiovascular status: blood pressure returned to baseline Anesthetic complications: no     Last Vitals:  Vitals:   07/30/17 1629 07/30/17 1711  BP: (!) 146/78 120/68  Pulse: 88 66  Resp: 16 16  Temp: (!) 35.9 C (!) 36.1 C  SpO2: 100% 99%    Last Pain:  Vitals:   07/31/17 0820  TempSrc:   PainSc: 0-No pain                 Bailey Faiella

## 2017-08-02 ENCOUNTER — Ambulatory Visit (INDEPENDENT_AMBULATORY_CARE_PROVIDER_SITE_OTHER): Payer: Medicaid Other

## 2017-08-02 DIAGNOSIS — C50411 Malignant neoplasm of upper-outer quadrant of right female breast: Secondary | ICD-10-CM

## 2017-08-02 DIAGNOSIS — Z170 Estrogen receptor positive status [ER+]: Secondary | ICD-10-CM

## 2017-08-02 LAB — SURGICAL PATHOLOGY

## 2017-08-02 NOTE — Progress Notes (Signed)
Patient ID: Chelsea Hudson, female   DOB: 1966-01-05, 51 y.o.   MRN: [id number] Patient here for a post op right partial mastectomy done on 07/30/17. She has her drain sheet with her. She reports that she has been using some of her prescription pain medication. Drain patient. Surgery site healing well. No signs of infection noted. Patient to follow up next week with Dr Bary Castilla.

## 2017-08-03 ENCOUNTER — Other Ambulatory Visit: Payer: Self-pay | Admitting: General Surgery

## 2017-08-07 ENCOUNTER — Other Ambulatory Visit: Payer: Self-pay | Admitting: Nurse Practitioner

## 2017-08-07 NOTE — Progress Notes (Signed)
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  INTERVAL HISTORY: Patient returns to clinic today for further evaluation and post surgical follow-up. Pathology revealed significant residual disease. Patient also noted to have 2 microscopic foci at her surgical margin. She continues to have a surgical drain in place. She currently feels well and is asymptomatic. Her peripheral neuropathy has essentially resolved. She denies any recent fevers or illnesses. She has a good appetite and denies weight loss. She has no chest pain or shortness of breath. She denies any nausea, vomiting, constipation, or diarrhea. She has no urinary complaints. Patient offers no further specific complaints today.  REVIEW OF SYSTEMS:   Review of Systems  Constitutional: Negative.  Negative for fever, malaise/fatigue and weight loss.  HENT: Negative.  Negative for congestion.   Respiratory: Negative.  Negative for cough and shortness of breath.   Cardiovascular: Negative.  Negative for chest pain and leg swelling.  Gastrointestinal: Negative.  Negative for abdominal pain, nausea and vomiting.  Genitourinary: Negative.   Skin: Negative.  Negative for rash.  Neurological: Negative.  Negative for sensory change and weakness.  Psychiatric/Behavioral: Negative.  The patient is not nervous/anxious.     As per HPI. Otherwise, a complete review of systems is negative.  PAST MEDICAL HISTORY: Past Medical History:  Diagnosis Date  . Arthritis    SHOULDER  . Cancer ([place])  02/28/2017   INVASIVE MAMMARY CARCINOMA WITH MUCINOUS FEATURES.  . Colonic diverticular abscess 06/21/2017   Colonoscopy 06/25/2017: No evidence of malignancy.  . Cough 07/27/2017   PT STATES SHE FEELS LIKE SHE IS GETTING A COLD-HAS DRY COUGH-DENIES FEVER-INSTRUCTED TO INFORM DR BYRNETTS OFFICE  . Hypertension   . Irregular heart beat    PT STATES IT "SKIPS A BEAT"   . Obesity   . Personal history of chemotherapy     PAST SURGICAL HISTORY: Past Surgical History:  Procedure Laterality Date  . ANKLE SURGERY    . BREAST BIOPSY Right 02/28/2017   INVASIVE MAMMARY CARCINOMA WITH MUCINOUS FEATURES.  Marland Kitchen BREAST CYST ASPIRATION Right    NEG  . COLONOSCOPY WITH PROPOFOL N/A 06/25/2017   Procedure: COLONOSCOPY WITH PROPOFOL;  Surgeon: Jonathon Bellows, MD;  Location: [facility] ENDOSCOPY;  Service: Gastroenterology;  Laterality: N/A;  . MASTECTOMY, PARTIAL Right 07/30/2017   Procedure: MASTECTOMY PARTIAL;  Surgeon: Robert Bellow, MD;  Location: [facility] ORS;  Service: General;  Laterality: Right;  . PORTACATH PLACEMENT Left 03/15/2017   Procedure: INSERTION PORT-A-CATH;  Surgeon: Robert Bellow, MD;  Location: [facility] ORS;  Service: General;  Laterality: Left;  . SENTINEL NODE BIOPSY Right 07/30/2017   Procedure: SENTINEL NODE BIOPSY;  Surgeon: Robert Bellow, MD;  Location: [facility] ORS;  Service: General;  Laterality: Right;    FAMILY HISTORY: Family History  Problem Relation Age of Onset  . Diabetes Father   . Stroke Father   . Breast cancer Mother 53  . Brain cancer Maternal Aunt 60  . Colon cancer Neg Hx     ADVANCED DIRECTIVES (Y/N):  N  HEALTH MAINTENANCE: Social History  Substance Use Topics  .  Smoking status: Current Every Day Smoker    Packs/day: 0.25    Years: 25.00    Types: Cigarettes  . Smokeless tobacco: Never Used  . Alcohol use Yes     Comment: occas     Colonoscopy:  PAP:  Bone density:  Lipid panel:  Allergies  Allergen Reactions  . Other Hives and Itching     [name]     Current Outpatient Prescriptions  Medication Sig Dispense Refill  . diphenhydrAMINE (BENADRYL) 25 MG tablet Take 1 tablet (25 mg total) by mouth at bedtime as needed for sleep. 30 tablet 0  . gabapentin (NEURONTIN) 100 MG capsule Take 1 capsule (100 mg total) by mouth 3 (three) times daily. OR 3 tablets once daily at bedtime. (Patient taking differently: Take 300 mg by mouth at bedtime. OR 3 tablets once daily at bedtime.) 90 capsule 3  . HYDROcodone-acetaminophen (NORCO) 5-325 MG tablet Take 1-2 tablets by mouth every 4 (four) hours as needed for moderate pain. Take no more than 10 tablets/ day. 30 tablet 0  . lidocaine-prilocaine (EMLA) cream Apply to affected area once (Patient taking differently: Apply 1 application topically as needed (befor Dr appointments). Apply to affected area once) 30 g 3  . triamterene-hydrochlorothiazide (MAXZIDE-25) 37.5-25 MG tablet Take 1 tablet by mouth daily. (Patient taking differently: Take 1 tablet by mouth at bedtime. ) 90 tablet 1  . ondansetron (ZOFRAN) 8 MG tablet Take 1 tablet (8 mg total) by mouth 2 (two) times daily as needed. 60 tablet 3   No current facility-administered medications for this visit.     OBJECTIVE: Vitals:   08/08/17 1052  BP: (!) 151/97  Pulse: 89  Resp: 20  Temp: (!) 96.9 F (36.1 C)     Body mass index is 40.53 kg/m.    ECOG FS:0 - Asymptomatic  General: Well-developed, well-nourished, no acute distress. Eyes: Pink conjunctiva, anicteric sclera. Breasts: Well healing surgical scar with drain in place. Heart: Regular rate and rhythm. No rubs, murmurs, or gallops. Abdomen: Soft, tender, non-distended. No organomegaly noted, normoactive bowel sounds. Musculoskeletal: No edema, cyanosis, or clubbing. Neuro: Alert, answering all questions appropriately. Cranial nerves grossly intact. Skin: No rashes or petechiae  noted. Psych: Normal affect.  LAB RESULTS:  Lab Results  Component Value Date   NA 136 08/08/2017   K 4.4 08/08/2017   CL 102 08/08/2017   CO2 25 08/08/2017   GLUCOSE 137 (H) 08/08/2017   BUN 10 08/08/2017   CREATININE 0.73 08/08/2017   CALCIUM 9.2 08/08/2017   PROT 7.1 08/08/2017   ALBUMIN 3.4 (L) 08/08/2017   AST 32 08/08/2017   ALT 44 08/08/2017   ALKPHOS 88 08/08/2017   BILITOT 0.4 08/08/2017   GFRNONAA >60 08/08/2017   GFRAA >60 08/08/2017    Lab Results  Component Value Date   WBC 6.9 08/08/2017   NEUTROABS 5.4 08/08/2017   HGB 11.6 (L) 08/08/2017   HCT 34.4 (L) 08/08/2017   MCV 85.3 08/08/2017   PLT 348 08/08/2017     STUDIES: Nm Sentinel Node Injection  Result Date: 07/30/2017 CLINICAL DATA:  Right breast cancer. EXAM: NUCLEAR MEDICINE BREAST LYMPHOSCINTIGRAPHY TECHNIQUE: Intradermal injection of radiopharmaceutical was performed at the 12 o'clock, 3 o'clock, 6 o'clock, and 9 o'clock positions around the right nipple. The patient was then sent to the operating room where the sentinel node(s) were identified and removed by the surgeon.  RADIOPHARMACEUTICALS:  Total of 1 mCi Millipore-filtered Technetium-67msulfur colloid, injected in four aliquots of 0.25 mCi each. IMPRESSION: Uncomplicated intradermal injection of a total of 1 mCi Technetium-956mulfur colloid for purposes of sentinel node identification. Electronically Signed   By: MaInez Catalina.D.   On: 07/30/2017 11:04   Mm Breast Surgical Specimen  Result Date: 07/30/2017 Specimen radiograph confirmed removal of the previously placed biopsy clip.   UsKoreareast Complete Uni Right Inc Axilla  Result Date: 07/25/2017 Ultrasound examination of the right breast in the 12:00 position 8 cm from the nipple below the area of focal tethering of the skin showed a residual mass measuring 0.87 x 1.0 x 1.5 cm. This is 0.96 cm below the skin. This corresponds with the previously placed biopsy clip by the radiology service.  BI-RADS-6. Examination of the axilla shows multiple enlarged nodes measuring up to 1.69 cm in diameter. 2 dominant nodes appreciated. Previously placed height mark clip not clearly evident.    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  PLAN:   1. 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 [place] [facility] Patient completed cycle 7 of 12 of Taxol on June 27, 2017. Treatment was then discontinued secondary to worsening peripheral neuropathy. Given the ER/PR status of her tumor she will benefit from either tamoxifen or an aromatase inhibitor for total 5 years at the conclusion of her XRT. Patient will follow-up the conclusion of her XRT for further evaluation. 2. Genetic testing: Negative. 3. Left upper and lower abdominal pain: Patient had colonoscopy last week did not reveal any malignancy, but did reveal an abscess. Patient has been instructed to continue her antibiotics as prescribed.   4. Anemia: Stable, monitor. 5. Joint pain: Improved. Patient does not complain of pain today.  6. Peripheral neuropathy: Resolved.  Patient expressed understanding and was in agreement with this plan. She also understands that She can call clinic at any time with any questions, concerns, or complaints.   Cancer Staging Malignant neoplasm of upper-outer quadrant of right breast in female, estrogen receptor positive ([place] form: Breast, AJCC 8th Edition - Clinical stage from 03/05/2017: Stage IIA (cT2, cN2a, cM0, G2, ER: Positive,  PR: Positive, HER2: Negative) - Signed by FiLloyd HugerMD on 08/10/2017 - Pathologic stage from 08/10/2017: No Stage Recommended (ypT1c, pN3a, cM0, G2, ER: Positive, PR: Positive, HER2: Negative) - Signed by FiLloyd HugerMD on 08/10/2017   TiLloyd HugerMD   08/10/2017 11:02 PM

## 2017-08-08 ENCOUNTER — Encounter: Payer: Self-pay | Admitting: General Surgery

## 2017-08-08 ENCOUNTER — Inpatient Hospital Stay: Payer: Medicaid Other | Attending: Oncology

## 2017-08-08 ENCOUNTER — Ambulatory Visit (INDEPENDENT_AMBULATORY_CARE_PROVIDER_SITE_OTHER): Payer: Medicaid Other | Admitting: General Surgery

## 2017-08-08 ENCOUNTER — Inpatient Hospital Stay (HOSPITAL_BASED_OUTPATIENT_CLINIC_OR_DEPARTMENT_OTHER): Payer: Medicaid Other | Admitting: Oncology

## 2017-08-08 VITALS — BP 151/97 | HR 89 | Temp 96.9°F | Resp 20 | Wt 251.1 lb

## 2017-08-08 VITALS — BP 144/84 | HR 96 | Resp 16 | Ht 66.0 in | Wt 252.0 lb

## 2017-08-08 DIAGNOSIS — C50411 Malignant neoplasm of upper-outer quadrant of right female breast: Secondary | ICD-10-CM

## 2017-08-08 DIAGNOSIS — F17210 Nicotine dependence, cigarettes, uncomplicated: Secondary | ICD-10-CM | POA: Insufficient documentation

## 2017-08-08 DIAGNOSIS — Z808 Family history of malignant neoplasm of other organs or systems: Secondary | ICD-10-CM

## 2017-08-08 DIAGNOSIS — Z923 Personal history of irradiation: Secondary | ICD-10-CM | POA: Diagnosis not present

## 2017-08-08 DIAGNOSIS — Z9011 Acquired absence of right breast and nipple: Secondary | ICD-10-CM | POA: Diagnosis not present

## 2017-08-08 DIAGNOSIS — Z170 Estrogen receptor positive status [ER+]: Secondary | ICD-10-CM | POA: Insufficient documentation

## 2017-08-08 DIAGNOSIS — I10 Essential (primary) hypertension: Secondary | ICD-10-CM | POA: Insufficient documentation

## 2017-08-08 DIAGNOSIS — Z803 Family history of malignant neoplasm of breast: Secondary | ICD-10-CM | POA: Diagnosis not present

## 2017-08-08 DIAGNOSIS — M129 Arthropathy, unspecified: Secondary | ICD-10-CM | POA: Insufficient documentation

## 2017-08-08 DIAGNOSIS — K651 Peritoneal abscess: Secondary | ICD-10-CM | POA: Diagnosis not present

## 2017-08-08 DIAGNOSIS — Z79899 Other long term (current) drug therapy: Secondary | ICD-10-CM

## 2017-08-08 DIAGNOSIS — E669 Obesity, unspecified: Secondary | ICD-10-CM | POA: Diagnosis not present

## 2017-08-08 DIAGNOSIS — G629 Polyneuropathy, unspecified: Secondary | ICD-10-CM | POA: Insufficient documentation

## 2017-08-08 DIAGNOSIS — D649 Anemia, unspecified: Secondary | ICD-10-CM | POA: Diagnosis not present

## 2017-08-08 DIAGNOSIS — Z800 Family history of malignant neoplasm of digestive organs: Secondary | ICD-10-CM

## 2017-08-08 LAB — CBC WITH DIFFERENTIAL/PLATELET
Basophils Absolute: 0 10*3/uL (ref 0–0.1)
Basophils Relative: 1 %
EOS ABS: 0.2 10*3/uL (ref 0–0.7)
Eosinophils Relative: 3 %
HCT: 34.4 % — ABNORMAL LOW (ref 35.0–47.0)
Hemoglobin: 11.6 g/dL — ABNORMAL LOW (ref 12.0–16.0)
LYMPHS ABS: 0.8 10*3/uL — AB (ref 1.0–3.6)
Lymphocytes Relative: 12 %
MCH: 28.8 pg (ref 26.0–34.0)
MCHC: 33.8 g/dL (ref 32.0–36.0)
MCV: 85.3 fL (ref 80.0–100.0)
MONOS PCT: 6 %
Monocytes Absolute: 0.4 10*3/uL (ref 0.2–0.9)
NEUTROS PCT: 78 %
Neutro Abs: 5.4 10*3/uL (ref 1.4–6.5)
PLATELETS: 348 10*3/uL (ref 150–440)
RBC: 4.03 MIL/uL (ref 3.80–5.20)
RDW: 17.3 % — AB (ref 11.5–14.5)
WBC: 6.9 10*3/uL (ref 3.6–11.0)

## 2017-08-08 LAB — COMPREHENSIVE METABOLIC PANEL
ALT: 44 U/L (ref 14–54)
AST: 32 U/L (ref 15–41)
Albumin: 3.4 g/dL — ABNORMAL LOW (ref 3.5–5.0)
Alkaline Phosphatase: 88 U/L (ref 38–126)
Anion gap: 9 (ref 5–15)
BUN: 10 mg/dL (ref 6–20)
CHLORIDE: 102 mmol/L (ref 101–111)
CO2: 25 mmol/L (ref 22–32)
Calcium: 9.2 mg/dL (ref 8.9–10.3)
Creatinine, Ser: 0.73 mg/dL (ref 0.44–1.00)
Glucose, Bld: 137 mg/dL — ABNORMAL HIGH (ref 65–99)
POTASSIUM: 4.4 mmol/L (ref 3.5–5.1)
SODIUM: 136 mmol/L (ref 135–145)
Total Bilirubin: 0.4 mg/dL (ref 0.3–1.2)
Total Protein: 7.1 g/dL (ref 6.5–8.1)

## 2017-08-08 NOTE — Progress Notes (Signed)
Patient ID: Chelsea Hudson, female   DOB: [birth year], 51 y.o.   MRN: [id number]  Chief Complaint  Patient presents with  . Routine Post Op    HPI Chelsea Hudson is a 51 y.o. female.  Here today for postoperative visit, right partial mastectomy on 07-30-17. She states it is a little sore. She saw Dr Grayland Ormond this morning. Drain sheet present. She plans radiation to be done at [facility] so she is closer to her work. She is here with her mother, Chelsea Hudson.  HPI  Past Medical History:  Diagnosis Date  . Arthritis    SHOULDER  . Cancer ([place]) 02/28/2017   INVASIVE MAMMARY CARCINOMA WITH MUCINOUS FEATURES.  . Colonic diverticular abscess 06/21/2017   Colonoscopy 06/25/2017: No evidence of malignancy.  . Cough 07/27/2017   PT STATES SHE FEELS LIKE SHE IS GETTING A COLD-HAS DRY COUGH-DENIES FEVER-INSTRUCTED TO INFORM DR BYRNETTS OFFICE  . Hypertension   . Irregular heart beat    PT STATES IT "SKIPS A BEAT"   . Obesity   . Personal history of chemotherapy     Past Surgical History:  Procedure Laterality Date  . ANKLE SURGERY    . BREAST BIOPSY Right 02/28/2017   INVASIVE MAMMARY CARCINOMA WITH MUCINOUS FEATURES.  Marland Kitchen BREAST CYST ASPIRATION Right    NEG  . COLONOSCOPY WITH PROPOFOL N/A 06/25/2017   Procedure: COLONOSCOPY WITH PROPOFOL;  Surgeon: Jonathon Bellows, MD;  Location: [facility] ENDOSCOPY;  Service: Gastroenterology;  Laterality: N/A;  . MASTECTOMY, PARTIAL Right 07/30/2017   Procedure: MASTECTOMY PARTIAL;  Surgeon: Robert Bellow, MD;  Location: [facility] ORS;  Service: General;  Laterality: Right;  . PORTACATH PLACEMENT Left 03/15/2017   Procedure: INSERTION PORT-A-CATH;  Surgeon: Robert Bellow, MD;  Location: [facility] ORS;  Service: General;  Laterality: Left;  . SENTINEL NODE BIOPSY Right 07/30/2017   Procedure: SENTINEL NODE BIOPSY;  Surgeon: Robert Bellow, MD;  Location: [facility] ORS;  Service: General;  Laterality: Right;    Family History  Problem Relation Age of Onset  .  Diabetes Father   . Stroke Father   . Breast cancer Mother 82  . Brain cancer Maternal Aunt 60  . Colon cancer Neg Hx     Social History Social History  Substance Use Topics  . Smoking status: Current Every Day Smoker    Packs/day: 0.25    Years: 25.00    Types: Cigarettes  . Smokeless tobacco: Never Used  . Alcohol use Yes     Comment: occas    Allergies  Allergen Reactions  . Other Hives and Itching    [name]     Current Outpatient Prescriptions  Medication Sig Dispense Refill  . diphenhydrAMINE (BENADRYL) 25 MG tablet Take 1 tablet (25 mg total) by mouth at bedtime as needed for sleep. 30 tablet 0  . gabapentin (NEURONTIN) 100 MG capsule Take 1 capsule (100 mg total) by mouth 3 (three) times daily. OR 3 tablets once daily at bedtime. (Patient taking differently: Take 300 mg by mouth at bedtime. OR 3 tablets once daily at bedtime.) 90 capsule 3  . HYDROcodone-acetaminophen (NORCO) 5-325 MG tablet Take 1-2 tablets by mouth every 4 (four) hours as needed for moderate pain. Take no more than 10 tablets/ day. 30 tablet 0  . lidocaine-prilocaine (EMLA) cream Apply to affected area once (Patient taking differently: Apply 1 application topically as needed (befor  Dr appointments). Apply to affected area once) 30 g 3  . ondansetron (ZOFRAN) 8 MG tablet Take 1 tablet (8 mg total) by mouth 2 (two) times daily as needed. 60 tablet 3  . triamterene-hydrochlorothiazide (MAXZIDE-25) 37.5-25 MG tablet Take 1 tablet by mouth daily. (Patient taking differently: Take 1 tablet by mouth at bedtime. ) 90 tablet 1   No current facility-administered medications for this visit.     Review of Systems Review of Systems  Constitutional: Negative.   Respiratory: Negative.   Cardiovascular: Negative.     Blood pressure (!) 144/84, pulse 96, resp. rate 16, height 5\' 6"  (1.676 m), weight 252 lb (114.3  kg), last menstrual period 01/31/2014.  Physical Exam Physical Exam  Constitutional: She is oriented to person, place, and time. She appears well-developed and well-nourished.  Cardiovascular: Normal rate and regular rhythm.   Pulmonary/Chest: Effort normal and breath sounds normal.    Incisions clean with steri strips, drain intact.  Neurological: She is alert and oriented to person, place, and time.  Skin: Skin is warm and dry.  Psychiatric: Her behavior is normal.    Data Reviewed Pathology showed 2 microscopic foci on the inferior lateral aspect of the reexcision site. 8 of 12 nodes positive. (post neoadjuvant chemotherapy)  Assessment    Doing well status post wide excision.    Plan    Long discussion with the patient and her mother regarding the risks associated with microscopic positive margins. In light of 8/12 nodes with metastatic disease, I think the patient's greatest morbidity and mortality will come from distant rather than in breast recurrence. This was confirmed in consultation with Dr. Grayland Ormond by phone after the patient's visit. This allowed also been reviewed with the local radiation oncologist by phone.  The option to have the area reexcised for complete negative margins was reviewed and offered to the patient if she felt this was in her best interest.  In phone follow-up on Friday, 08/10/2017 she reports that she does desire to proceed to reexcision.  The patient will be contacted on Monday, September 24 to look for dates for scheduling her reexcision.     Continue to record drainage amounts. Follow up in one week.    HPI, Physical Exam, Assessment and Plan have been scribed under the direction and in the presence of Robert Bellow, MD. Karie Fetch, RN   Robert Bellow 08/10/2017, 8:01 PM

## 2017-08-08 NOTE — Progress Notes (Signed)
Patient denies any concerns today.  

## 2017-08-08 NOTE — Patient Instructions (Signed)
Continue to record drainage amounts

## 2017-08-13 ENCOUNTER — Telehealth: Payer: Self-pay | Admitting: *Deleted

## 2017-08-13 NOTE — Telephone Encounter (Signed)
Patient's surgery has been scheduled for 08-17-17 at [facility].   Surgery instructions to be reviewed at time of office visit with Dr. Bary Castilla tomorrow.

## 2017-08-13 NOTE — Telephone Encounter (Signed)
-----   Message from Robert Bellow, MD sent at 08/10/2017  8:05 PM EDT ----- Please schedule the patient for reexcision of the right breast cancer. Check with Gwenette Greet for the appropriate code. No sentinel node.

## 2017-08-14 ENCOUNTER — Telehealth: Payer: Self-pay

## 2017-08-14 ENCOUNTER — Ambulatory Visit (INDEPENDENT_AMBULATORY_CARE_PROVIDER_SITE_OTHER): Payer: Medicaid Other | Admitting: General Surgery

## 2017-08-14 ENCOUNTER — Encounter: Payer: Self-pay | Admitting: General Surgery

## 2017-08-14 VITALS — BP 130/72 | HR 70 | Resp 14 | Ht 66.0 in | Wt 248.0 lb

## 2017-08-14 DIAGNOSIS — C50411 Malignant neoplasm of upper-outer quadrant of right female breast: Secondary | ICD-10-CM

## 2017-08-14 DIAGNOSIS — Z170 Estrogen receptor positive status [ER+]: Secondary | ICD-10-CM

## 2017-08-14 NOTE — Telephone Encounter (Signed)
Message left for patient to call about appointment, she was to be seen today for a pre op for surgery scheduled for 08/17/17.

## 2017-08-14 NOTE — Progress Notes (Signed)
Patient ID: Chelsea Hudson, female   DOB: 09/04/1966, 51 y.o.   MRN: [id number]  Chief Complaint  Patient presents with  . Routine Post Op    HPI Chelsea Hudson is a 51 y.o. female here today for her right breast wide excision done on 07/30/2017. Patient states she has been draining about 60 ml of fluid daily.    Marland KitchenHPI  Past Medical History:  Diagnosis Date  . Arthritis    SHOULDER  . Cancer ([place]) 02/28/2017   INVASIVE MAMMARY CARCINOMA WITH MUCINOUS FEATURES.  . Colonic diverticular abscess 06/21/2017   Colonoscopy 06/25/2017: No evidence of malignancy.  . Hypertension   . Irregular heart beat    PT STATES IT "SKIPS A BEAT"   . Obesity   . Personal history of chemotherapy     Past Surgical History:  Procedure Laterality Date  . ANKLE SURGERY    . BREAST BIOPSY Right 02/28/2017   INVASIVE MAMMARY CARCINOMA WITH MUCINOUS FEATURES.  Marland Kitchen BREAST CYST ASPIRATION Right    NEG  . COLONOSCOPY WITH PROPOFOL N/A 06/25/2017   Procedure: COLONOSCOPY WITH PROPOFOL;  Surgeon: Jonathon Bellows, MD;  Location: [facility] ENDOSCOPY;  Service: Gastroenterology;  Laterality: N/A;  . MASTECTOMY, PARTIAL Right 07/30/2017   Procedure: MASTECTOMY PARTIAL;  Surgeon: Robert Bellow, MD;  Location: [facility] ORS;  Service: General;  Laterality: Right;  . PORTACATH PLACEMENT Left 03/15/2017   Procedure: INSERTION PORT-A-CATH;  Surgeon: Robert Bellow, MD;  Location: [facility] ORS;  Service: General;  Laterality: Left;  . SENTINEL NODE BIOPSY Right 07/30/2017   Procedure: SENTINEL NODE BIOPSY;  Surgeon: Robert Bellow, MD;  Location: [facility] ORS;  Service: General;  Laterality: Right;    Family History  Problem Relation Age of Onset  . Diabetes Father   . Stroke Father   . Breast cancer Mother 67  . Brain cancer Maternal Aunt 60  . Colon cancer Neg Hx     Social History Social History  Substance Use Topics  . Smoking status: Current Every Day Smoker    Packs/day: 0.25    Years: 25.00    Types: Cigarettes   . Smokeless tobacco: Never Used  . Alcohol use Yes     Comment: occas    Allergies  Allergen Reactions  . Other Hives and Itching    [name]     Current Outpatient Prescriptions  Medication Sig Dispense Refill  . diphenhydrAMINE (BENADRYL) 25 MG tablet Take 1 tablet (25 mg total) by mouth at bedtime as needed for sleep. 30 tablet 0  . gabapentin (NEURONTIN) 100 MG capsule Take 1 capsule (100 mg total) by mouth 3 (three) times daily. OR 3 tablets once daily at bedtime. (Patient taking differently: Take 300 mg by mouth at bedtime. OR 3 tablets once daily at bedtime.) 90 capsule 3  . HYDROcodone-acetaminophen (NORCO) 5-325 MG tablet Take 1-2 tablets by mouth every 4 (four) hours as needed for moderate pain. Take no more than 10 tablets/ day. 30 tablet 0  . lidocaine-prilocaine (EMLA) cream Apply to affected area once (Patient taking differently: Apply 1 application topically as needed (befor Dr appointments). Apply to affected area once) 30 g 3  . ondansetron (ZOFRAN) 8 MG tablet Take 1 tablet (8 mg total) by mouth 2 (two) times daily as needed. 60 tablet 3  . triamterene-hydrochlorothiazide (MAXZIDE-25) 37.5-25 MG tablet Take 1 tablet by mouth daily. (Patient taking  differently: Take 1 tablet by mouth at bedtime. ) 90 tablet 1   No current facility-administered medications for this visit.     Review of Systems Review of Systems  Constitutional: Negative.   Cardiovascular: Negative.   Gastrointestinal: Negative.     Blood pressure 130/72, pulse 70, resp. rate 14, height 5\' 6"  (1.676 m), weight 248 lb (112.5 kg), last menstrual period 01/31/2014.  Physical Exam Physical Exam  Constitutional: She is oriented to person, place, and time. She appears well-developed and well-nourished.  Cardiovascular: Normal rate, regular rhythm and normal heart sounds.   Pulmonary/Chest: Effort normal  and breath sounds normal.    Neurological: She is alert and oriented to person, place, and time.  Skin: Skin is warm and dry.    Data Reviewed 07/30/2017 wide excision: Original wide excision site: Size of specimen: Cranial-caudal 4.1 cm, medial-lateral 7.2 cm,  skin-deep 5.3 cm  Inferior margin highly suspicious for gross involvement.  Skin: 6 x 2.7 cm, unremarkable ellipse of tan-brown skin   New deep margin:  Size of specimen: Cranial-caudal 2.2 cm, medial-lateral 8.6 cm,  skin-deep 7.2 cm    D. RIGHT BREAST, NEW INFERIOR MARGIN; EXCISION:  - INVASIVE MAMMARY CARCINOMA.  - THE LATERAL MARGIN IS INVOLVED IN TWO SMALL AREAS.  Regional Lymph nodes:    Total # lymph nodes examined: 14    # Sentinel lymph nodes examined: 2    # Lymph nodes with macrometastasis (>2.0 mm): 10     Assessment    Microscopic residual disease in the lateral aspect of the new inferior margin of the wide excision site.  10 of 14 nodes positive with minimal treatment effect noted on microscopic exam.    Plan    The patient's case was presented at the [facility] Breast tumor conference last evening. No recommendation for reexcision based on the extent of nodal disease evident in the axilla and the high likelihood that recurrence would be systemic rather than in breast.  Risks of recurrent disease with negative margins quoted at 6%, with particles of margins and radiation therapy at 12%.  Potential for metastatic disease highly more probable and in breast recurrence reviewed.  The patient desires to proceed to an attempt to obtain clear margins.   Patient is scheduled for surgery on 08/17/2017. The patient is aware to call back for any questions or concerns.  HPI, Physical Exam, Assessment and Plan have been scribed under the direction and in the presence of Hervey Ard, MD.  Gaspar Cola, CMA  I have completed the exam and reviewed the above documentation for accuracy and completeness.   I agree with the above.  Haematologist has been used and any errors in dictation or transcription are unintentional.  Hervey Ard, M.D., F.A.C.S.  Surgery instructions have been reviewed with the patient. Documented by Lesly Rubenstein LPN  Robert Bellow 08/14/2017, 10:18 PM

## 2017-08-14 NOTE — Telephone Encounter (Signed)
Patient just woke up. She will come in at 2:15 pm today.

## 2017-08-14 NOTE — Patient Instructions (Addendum)
Patient is scheduled for surgery on 08/17/2017. The patient is aware to call back for any questions or concerns. Surgery instructions have been reviewed with the patient.

## 2017-08-16 ENCOUNTER — Encounter
Admission: RE | Admit: 2017-08-16 | Discharge: 2017-08-16 | Disposition: A | Discharge status: Home / Self Care | Payer: Medicaid Other | Source: Ambulatory Visit | Attending: General Surgery | Admitting: General Surgery

## 2017-08-16 MED ORDER — CEFAZOLIN SODIUM-DEXTROSE 2-4 GM/100ML-% IV SOLN
2.0000 g | INTRAVENOUS | Status: AC
  Administered 2017-08-17: 2 g via INTRAVENOUS

## 2017-08-16 NOTE — Patient Instructions (Signed)
  Your procedure is scheduled on: 08/17/17 Report to Day Surgery. To find out your arrival time please call [phone] between 1PM - 3PM on 08/16/17.  Remember: Instructions that are not followed completely may result in serious medical risk, up to and including death, or upon the discretion of your surgeon and anesthesiologist your surgery may need to be rescheduled.    __x__ 1. Do not eat food after midnight the night before your procedure. No gum chewing or hard candies. You may drink clear liquids up to 2 hours before you are scheduled to arrive for your surgery- DO not drink clear liquids within 2 hours of the start of your surgery.  Clear Liquids include: water, apple juice without pulp, clear carbohydrate drink such as Clearfast of Gartorade, Black Coffee or Tea (Do not add anything to coffee or tea).    ____ 2. No Alcohol for 24 hours before or after surgery.   _x___ 3. Do Not Smoke For 24 Hours Prior to Your Surgery.   ____ 4. Bring all medications with you on the day of surgery if instructed.    _x___ 5. Notify your doctor if there is any change in your medical condition     (cold, fever, infections).       Do not wear jewelry, make-up, hairpins, clips or nail polish.  Do not wear lotions, powders, or perfumes. You may wear deodorant.  Do not shave 48 hours prior to surgery. Men may shave face and neck.  Do not bring valuables to the hospital.    [facility] is not responsible for any belongings or valuables.               Contacts, dentures or bridgework may not be worn into surgery.  Leave your suitcase in the car. After surgery it may be brought to your room.  For patients admitted to the hospital, discharge time is determined by your                treatment team.   Patients discharged the day of surgery will not be allowed to drive home.   ____ Take these medicines the morning of surgery with A SIP OF WATER:    1. none  2.   3.    4.  5.  6.  ____ Fleet Enema (as directed)   ____ Use CHG Soap as directed  ____ Use inhalers on the day of surgery  ____ Stop metformin 2 days prior to surgery    ____ Take 1/2 of usual insulin dose the night before surgery and none on the morning of surgery.   ____ Stop Coumadin/Plavix/aspirin on   ____ Stop Anti-inflammatories on   ____ Stop supplements until after surgery.    ____ Bring C-Pap to the hospital.

## 2017-08-17 ENCOUNTER — Encounter: Payer: Self-pay | Admitting: *Deleted

## 2017-08-17 ENCOUNTER — Ambulatory Visit
Admission: RE | Admit: 2017-08-17 | Discharge: 2017-08-17 | Disposition: A | Discharge status: Home / Self Care | Payer: Medicaid Other | Source: Ambulatory Visit | Attending: General Surgery | Admitting: General Surgery

## 2017-08-17 ENCOUNTER — Encounter
Admission: RE | Disposition: A | Discharge status: Home / Self Care | Payer: Self-pay | Source: Ambulatory Visit | Attending: General Surgery

## 2017-08-17 ENCOUNTER — Ambulatory Visit: Payer: Medicaid Other | Admitting: Registered Nurse

## 2017-08-17 DIAGNOSIS — E669 Obesity, unspecified: Secondary | ICD-10-CM | POA: Diagnosis not present

## 2017-08-17 DIAGNOSIS — E119 Type 2 diabetes mellitus without complications: Secondary | ICD-10-CM | POA: Insufficient documentation

## 2017-08-17 DIAGNOSIS — C50411 Malignant neoplasm of upper-outer quadrant of right female breast: Secondary | ICD-10-CM

## 2017-08-17 DIAGNOSIS — Z8719 Personal history of other diseases of the digestive system: Secondary | ICD-10-CM | POA: Diagnosis not present

## 2017-08-17 DIAGNOSIS — K219 Gastro-esophageal reflux disease without esophagitis: Secondary | ICD-10-CM | POA: Diagnosis not present

## 2017-08-17 DIAGNOSIS — Z9221 Personal history of antineoplastic chemotherapy: Secondary | ICD-10-CM | POA: Diagnosis not present

## 2017-08-17 DIAGNOSIS — Z79899 Other long term (current) drug therapy: Secondary | ICD-10-CM | POA: Diagnosis not present

## 2017-08-17 DIAGNOSIS — I499 Cardiac arrhythmia, unspecified: Secondary | ICD-10-CM | POA: Insufficient documentation

## 2017-08-17 DIAGNOSIS — F17210 Nicotine dependence, cigarettes, uncomplicated: Secondary | ICD-10-CM | POA: Diagnosis not present

## 2017-08-17 DIAGNOSIS — Z170 Estrogen receptor positive status [ER+]: Secondary | ICD-10-CM

## 2017-08-17 DIAGNOSIS — I10 Essential (primary) hypertension: Secondary | ICD-10-CM | POA: Diagnosis not present

## 2017-08-17 DIAGNOSIS — M19019 Primary osteoarthritis, unspecified shoulder: Secondary | ICD-10-CM | POA: Insufficient documentation

## 2017-08-17 DIAGNOSIS — C50911 Malignant neoplasm of unspecified site of right female breast: Secondary | ICD-10-CM | POA: Diagnosis not present

## 2017-08-17 DIAGNOSIS — Z6841 Body Mass Index (BMI) 40.0 and over, adult: Secondary | ICD-10-CM | POA: Diagnosis not present

## 2017-08-17 HISTORY — PX: RE-EXCISION OF BREAST LUMPECTOMY: SHX6048

## 2017-08-17 SURGERY — EXCISION, LESION, BREAST
Anesthesia: General | Laterality: Right | Wound class: Clean

## 2017-08-17 MED ORDER — FENTANYL CITRATE (PF) 100 MCG/2ML IJ SOLN
25.0000 ug | INTRAMUSCULAR | Status: DC | PRN
  Administered 2017-08-17 (×2): 25 ug via INTRAVENOUS

## 2017-08-17 MED ORDER — BUPIVACAINE-EPINEPHRINE (PF) 0.5% -1:200000 IJ SOLN
INTRAMUSCULAR | Status: DC | PRN
  Administered 2017-08-17: 30 mL via PERINEURAL

## 2017-08-17 MED ORDER — FENTANYL CITRATE (PF) 100 MCG/2ML IJ SOLN
INTRAMUSCULAR | Status: AC
  Administered 2017-08-17: 25 ug via INTRAVENOUS
  Filled 2017-08-17: qty 2

## 2017-08-17 MED ORDER — SUCCINYLCHOLINE CHLORIDE 20 MG/ML IJ SOLN
INTRAMUSCULAR | Status: DC | PRN
  Administered 2017-08-17: 100 mg via INTRAVENOUS

## 2017-08-17 MED ORDER — FAMOTIDINE 20 MG PO TABS
ORAL_TABLET | ORAL | Status: AC
  Administered 2017-08-17: 20 mg via ORAL
  Filled 2017-08-17: qty 1

## 2017-08-17 MED ORDER — FAMOTIDINE 20 MG PO TABS
20.0000 mg | ORAL_TABLET | Freq: Once | ORAL | Status: AC
  Administered 2017-08-17: 20 mg via ORAL

## 2017-08-17 MED ORDER — GLYCOPYRROLATE 0.2 MG/ML IJ SOLN
INTRAMUSCULAR | Status: AC
  Filled 2017-08-17: qty 1

## 2017-08-17 MED ORDER — KETOROLAC TROMETHAMINE 30 MG/ML IJ SOLN
INTRAMUSCULAR | Status: AC
  Filled 2017-08-17: qty 1

## 2017-08-17 MED ORDER — ONDANSETRON HCL 4 MG/2ML IJ SOLN: 4.0000 mg | Freq: Once | INTRAMUSCULAR | Status: DC | PRN

## 2017-08-17 MED ORDER — CEFAZOLIN SODIUM-DEXTROSE 2-4 GM/100ML-% IV SOLN
INTRAVENOUS | Status: AC
  Filled 2017-08-17: qty 100

## 2017-08-17 MED ORDER — ACETAMINOPHEN 10 MG/ML IV SOLN
INTRAVENOUS | Status: AC
  Filled 2017-08-17: qty 100

## 2017-08-17 MED ORDER — DEXAMETHASONE SODIUM PHOSPHATE 10 MG/ML IJ SOLN
INTRAMUSCULAR | Status: DC | PRN
  Administered 2017-08-17: 10 mg via INTRAVENOUS

## 2017-08-17 MED ORDER — PROPOFOL 10 MG/ML IV BOLUS
INTRAVENOUS | Status: DC | PRN
  Administered 2017-08-17: 150 mg via INTRAVENOUS

## 2017-08-17 MED ORDER — DEXAMETHASONE SODIUM PHOSPHATE 10 MG/ML IJ SOLN
INTRAMUSCULAR | Status: AC
  Filled 2017-08-17: qty 1

## 2017-08-17 MED ORDER — MIDAZOLAM HCL 2 MG/2ML IJ SOLN
INTRAMUSCULAR | Status: DC | PRN
  Administered 2017-08-17: 2 mg via INTRAVENOUS

## 2017-08-17 MED ORDER — LACTATED RINGERS IV BOLUS (SEPSIS)
250.0000 mL | Freq: Once | INTRAVENOUS | Status: AC
  Administered 2017-08-17: 250 mL via INTRAVENOUS

## 2017-08-17 MED ORDER — SODIUM CHLORIDE FLUSH 0.9 % IV SOLN
INTRAVENOUS | Status: AC
  Filled 2017-08-17: qty 10

## 2017-08-17 MED ORDER — ONDANSETRON HCL 4 MG/2ML IJ SOLN
INTRAMUSCULAR | Status: DC | PRN
Start: 2017-08-17 — End: 2017-08-17
  Administered 2017-08-17: 4 mg via INTRAVENOUS

## 2017-08-17 MED ORDER — FENTANYL CITRATE (PF) 100 MCG/2ML IJ SOLN
INTRAMUSCULAR | Status: AC
  Filled 2017-08-17: qty 2

## 2017-08-17 MED ORDER — LIDOCAINE HCL (CARDIAC) 20 MG/ML IV SOLN
INTRAVENOUS | Status: DC | PRN
  Administered 2017-08-17: 100 mg via INTRAVENOUS

## 2017-08-17 MED ORDER — ACETAMINOPHEN 10 MG/ML IV SOLN
INTRAVENOUS | Status: DC | PRN
  Administered 2017-08-17: 1000 mg via INTRAVENOUS

## 2017-08-17 MED ORDER — PHENYLEPHRINE HCL 10 MG/ML IJ SOLN
INTRAMUSCULAR | Status: DC | PRN
  Administered 2017-08-17 (×2): 200 ug via INTRAVENOUS

## 2017-08-17 MED ORDER — HEPARIN SOD (PORK) LOCK FLUSH 100 UNIT/ML IV SOLN
500.0000 [IU] | Freq: Once | INTRAVENOUS | Status: AC
  Administered 2017-08-17: 500 [IU] via INTRAVENOUS

## 2017-08-17 MED ORDER — HEPARIN SOD (PORK) LOCK FLUSH 100 UNIT/ML IV SOLN
INTRAVENOUS | Status: AC
  Administered 2017-08-17: 500 [IU] via INTRAVENOUS
  Filled 2017-08-17: qty 5

## 2017-08-17 MED ORDER — ONDANSETRON HCL 4 MG/2ML IJ SOLN
INTRAMUSCULAR | Status: AC
  Filled 2017-08-17: qty 2

## 2017-08-17 MED ORDER — LACTATED RINGERS IV SOLN
INTRAVENOUS | Status: DC
  Administered 2017-08-17: 10:00:00 via INTRAVENOUS

## 2017-08-17 MED ORDER — MIDAZOLAM HCL 2 MG/2ML IJ SOLN
INTRAMUSCULAR | Status: AC
  Filled 2017-08-17: qty 2

## 2017-08-17 MED ORDER — BUPIVACAINE-EPINEPHRINE (PF) 0.5% -1:200000 IJ SOLN
INTRAMUSCULAR | Status: AC
  Filled 2017-08-17: qty 30

## 2017-08-17 MED ORDER — LIDOCAINE HCL (PF) 2 % IJ SOLN
INTRAMUSCULAR | Status: AC
  Filled 2017-08-17: qty 6

## 2017-08-17 MED ORDER — FENTANYL CITRATE (PF) 100 MCG/2ML IJ SOLN
INTRAMUSCULAR | Status: DC | PRN
  Administered 2017-08-17: 100 ug via INTRAVENOUS

## 2017-08-17 MED ORDER — PROPOFOL 10 MG/ML IV BOLUS
INTRAVENOUS | Status: AC
  Filled 2017-08-17: qty 20

## 2017-08-17 SURGICAL SUPPLY — 42 items
BLADE SURG 15 STRL SS SAFETY (BLADE) ×6 IMPLANT
BNDG GAUZE 4.5X4.1 6PLY STRL (MISCELLANEOUS) IMPLANT
BRA SURGICAL 3XLRG (MISCELLANEOUS) ×3 IMPLANT
CANISTER SUCT 1200ML W/VALVE (MISCELLANEOUS) ×3 IMPLANT
CHLORAPREP W/TINT 26ML (MISCELLANEOUS) ×3 IMPLANT
CLOSURE WOUND 1/2 X4 (GAUZE/BANDAGES/DRESSINGS) ×1
CNTNR SPEC 2.5X3XGRAD LEK (MISCELLANEOUS) ×1
CONT SPEC 4OZ STER OR WHT (MISCELLANEOUS) ×2
CONTAINER SPEC 2.5X3XGRAD LEK (MISCELLANEOUS) ×1 IMPLANT
COVER PROBE FLX POLY STRL (MISCELLANEOUS) IMPLANT
DEVICE DUBIN SPECIMEN MAMMOGRA (MISCELLANEOUS) IMPLANT
DRAPE LAPAROTOMY 100X77 ABD (DRAPES) ×3 IMPLANT
DRSG OPSITE POSTOP 4X8 (GAUZE/BANDAGES/DRESSINGS) ×3 IMPLANT
DRSG TEGADERM 4X4.75 (GAUZE/BANDAGES/DRESSINGS) ×3 IMPLANT
DRSG TELFA 3X8 NADH (GAUZE/BANDAGES/DRESSINGS) ×3 IMPLANT
DRSG TELFA 4X3 1S NADH ST (GAUZE/BANDAGES/DRESSINGS) ×3 IMPLANT
ELECT CAUTERY BLADE TIP 2.5 (TIP) ×3
ELECT REM PT RETURN 9FT ADLT (ELECTROSURGICAL) ×3
ELECTRODE CAUTERY BLDE TIP 2.5 (TIP) ×1 IMPLANT
ELECTRODE REM PT RTRN 9FT ADLT (ELECTROSURGICAL) ×1 IMPLANT
GAUZE FLUFF 18X24 1PLY STRL (GAUZE/BANDAGES/DRESSINGS) ×3 IMPLANT
GLOVE BIO SURGEON STRL SZ7.5 (GLOVE) ×6 IMPLANT
GLOVE INDICATOR 8.0 STRL GRN (GLOVE) ×6 IMPLANT
GOWN STRL REUS W/ TWL LRG LVL3 (GOWN DISPOSABLE) ×2 IMPLANT
GOWN STRL REUS W/TWL LRG LVL3 (GOWN DISPOSABLE) ×4
KIT RM TURNOVER STRD PROC AR (KITS) ×3 IMPLANT
LABEL OR SOLS (LABEL) ×3 IMPLANT
MARGIN MAP 10MM (MISCELLANEOUS) ×3 IMPLANT
NDL SAFETY 22GX1.5 (NEEDLE) ×3 IMPLANT
NEEDLE HYPO 25X1 1.5 SAFETY (NEEDLE) ×3 IMPLANT
PACK BASIN MINOR ARMC (MISCELLANEOUS) ×3 IMPLANT
SHEARS HARMONIC 9CM CVD (BLADE) IMPLANT
STRIP CLOSURE SKIN 1/2X4 (GAUZE/BANDAGES/DRESSINGS) ×2 IMPLANT
SUT ETHILON 3-0 FS-10 30 BLK (SUTURE) ×3
SUT VIC AB 2-0 CT1 27 (SUTURE) ×6
SUT VIC AB 2-0 CT1 TAPERPNT 27 (SUTURE) ×3 IMPLANT
SUT VIC AB 4-0 FS2 27 (SUTURE) ×3 IMPLANT
SUTURE EHLN 3-0 FS-10 30 BLK (SUTURE) ×1 IMPLANT
SWABSTK COMLB BENZOIN TINCTURE (MISCELLANEOUS) ×3 IMPLANT
SYR CONTROL 10ML (SYRINGE) ×3 IMPLANT
TAPE TRANSPORE STRL 2 31045 (GAUZE/BANDAGES/DRESSINGS) IMPLANT
WATER STERILE IRR 1000ML POUR (IV SOLUTION) ×3 IMPLANT

## 2017-08-17 NOTE — Anesthesia Postprocedure Evaluation (Signed)
Anesthesia Post Note  Patient: Chelsea Hudson  Procedure(s) Performed: Procedure(s) (LRB): RE-EXCISION OF BREAST LUMPECTOMY (Right)  Patient location during evaluation: PACU Anesthesia Type: General Level of consciousness: awake and alert and oriented Pain management: pain level controlled Vital Signs Assessment: post-procedure vital signs reviewed and stable Respiratory status: spontaneous breathing Cardiovascular status: blood pressure returned to baseline Anesthetic complications: no     Last Vitals:  Vitals:   08/17/17 1231 08/17/17 1357  BP: 102/67 109/77  Pulse: 71 (!) 104  Resp: 14 14  Temp: (!) 36 C   SpO2: 100% 100%    Last Pain:  Vitals:   08/17/17 1231  TempSrc: Temporal  PainSc: 1                  Akash Winski

## 2017-08-17 NOTE — Anesthesia Preprocedure Evaluation (Addendum)
Anesthesia Evaluation  Patient identified by MRN, date of birth, ID band Patient awake    Reviewed: Allergy & Precautions, NPO status , Patient's Chart, lab work & pertinent test results  History of Anesthesia Complications Negative for: history of anesthetic complications  Airway Mallampati: III  TM Distance: <3 FB     Dental  (+) Edentulous Upper   Pulmonary neg sleep apnea, neg COPD, Current Smoker,    Pulmonary exam normal        Cardiovascular hypertension, Pt. on medications Normal cardiovascular exam+ dysrhythmias      Neuro/Psych  Neuromuscular disease negative psych ROS   GI/Hepatic Neg liver ROS, neg GERD  ,Colonic diverticular disease   Endo/Other  negative endocrine ROSneg diabetes  Renal/GU negative Renal ROS     Musculoskeletal  (+) Arthritis , Osteoarthritis,    Abdominal Normal abdominal exam  (+)   Peds negative pediatric ROS (+)  Hematology negative hematology ROS (+)   Anesthesia Other Findings   Reproductive/Obstetrics                            Anesthesia Physical  Anesthesia Plan  ASA: III  Anesthesia Plan: General   Post-op Pain Management:    Induction:   PONV Risk Score and Plan: 2 and Ondansetron and Dexamethasone  Airway Management Planned: Oral ETT  Additional Equipment:   Intra-op Plan:   Post-operative Plan: Extubation in OR  Informed Consent: I have reviewed the patients History and Physical, chart, labs and discussed the procedure including the risks, benefits and alternatives for the proposed anesthesia with the patient or authorized representative who has indicated his/her understanding and acceptance.     Plan Discussed with: CRNA and Surgeon  Anesthesia Plan Comments:        Anesthesia Quick Evaluation

## 2017-08-17 NOTE — Anesthesia Procedure Notes (Signed)
Procedure Name: Intubation Date/Time: 08/17/2017 10:12 AM Performed by: Doreen Salvage Pre-anesthesia Checklist: Patient identified, Patient being monitored, Timeout performed, Emergency Drugs available and Suction available Patient Re-evaluated:Patient Re-evaluated prior to induction Oxygen Delivery Method: Circle system utilized Preoxygenation: Pre-oxygenation with 100% oxygen Induction Type: IV induction Ventilation: Mask ventilation without difficulty Laryngoscope Size: Mac and 3 Grade View: Grade I Tube type: Oral Tube size: 7.0 mm Number of attempts: 1 Airway Equipment and Method: Stylet Placement Confirmation: ETT inserted through vocal cords under direct vision,  positive ETCO2 and breath sounds checked- equal and bilateral Secured at: 21 cm Tube secured with: Tape Dental Injury: Teeth and Oropharynx as per pre-operative assessment

## 2017-08-17 NOTE — Anesthesia Post-op Follow-up Note (Signed)
Anesthesia QCDR form completed.        

## 2017-08-17 NOTE — Discharge Instructions (Signed)
AMBULATORY SURGERY  DISCHARGE INSTRUCTIONS   1) The drugs that you were given will stay in your system until tomorrow so for the next 24 hours you should not:  A) Drive an automobile B) Make any legal decisions C) Drink any alcoholic beverage   2) You may resume regular meals tomorrow.  Today it is better to start with liquids and gradually work up to solid foods.  You may eat anything you prefer, but it is better to start with liquids, then soup and crackers, and gradually work up to solid foods.   3) Please notify your doctor immediately if you have any unusual bleeding, trouble breathing, redness and pain at the surgery site, drainage, fever, or pain not relieved by medication.    4) Additional Instructions:Drink lots of fluids today.  Take any pain medicines as instructed.  Stool softeners if you are taking narcotics.  Do not use your right arm for any repetitive movements. Do not lift over 10 pounds with right arm.  Do not carry a pocket book on right side also.  Keep surgical bra on around the clock for the next few days.     Please contact your physician with any problems or Same Day Surgery at [phone], Monday through Friday 6 am to 4 pm, or [place] at [facility] number at [phone].

## 2017-08-17 NOTE — H&P (Signed)
No change in clinical history or exam.  

## 2017-08-17 NOTE — Transfer of Care (Signed)
Immediate Anesthesia Transfer of Care Note  Patient: Chelsea Hudson  Procedure(s) Performed: Procedure(s): RE-EXCISION OF BREAST LUMPECTOMY (Right)  Patient Location: PACU  Anesthesia Type:General  Level of Consciousness: sedated  Airway & Oxygen Therapy: Patient Spontanous Breathing and Patient connected to face mask oxygen  Post-op Assessment: Report given to RN and Post -op Vital signs reviewed and stable  Post vital signs: Reviewed and stable  Last Vitals:  Vitals:   08/17/17 0913 08/17/17 1112  BP: (!) 131/93 (!) 87/56  Pulse: 89 80  Resp: 18 19  Temp: 36.7 C (!) 35.9 C  SpO2: 444% 58%    Complications: No apparent anesthesia complications

## 2017-08-17 NOTE — Op Note (Signed)
Preoperative diagnosis: Right breast cancer with positive margin.  Postoperative diagnosis: Same.  Operative procedure: Reexcision of the inferior and lateral margins of the wide excision site.  Operating surgeon: Lacie Scotts, M.D.  Anesthesia: Gen. endotracheal, Marcaine 0.5% with 1 200,000 units of epinephrine, 30 cc.  Estimated blood loss: 30 cc.  Clinical note: This 51 year old woman underwent excision of a right breast cancer after neoadjuvant chemotherapy 18 days ago. She was found to have a positive inferior lateral margin. Her case at been presented at the [facility] tumor board. She had 10 of 14 nodes positive and the anticipated benefit from margin resection was thought to be very small. The patient was apprised of this report but that the standard of care was to excise to clear margins and she desired to proceed in this direction.  Her mother had concerns about why any tumor deposits were left behind at the time of the original procedure. It was explained to her that this was microscopic and not able to be detected by touch or visual inspection.  Operative note: The patient still has her Keenan Bachelor drain and placed in the axilla status post axillary dissection and she received Kefzol intravenously prior to the procedure.  The breast chest and neck was prepped with ChloraPrep and draped. Marcaine was infiltrated for postoperative analgesia. The original incision was opened almost to the medial border and then extended 2 cm laterally. Hemostasis was electrocautery. The seroma cavity was identified. Tissue inferior to the seroma cavity in the plane of original resection and the lateral border were excised. Approximately 1-1/2-2 cm of tissue was excised en bloc as a "L" shaped specimen, orientated and hand delivered to the pathology department after the procedure was completed. Hemostasis was electrocautery. The breast following was reconstructed with multiple layers of  2-0 Vicryl figure-of-eight sutures. The skin was closed with a running 4-0 Vicryl subcuticular suture. Benzoin, Steri-Strips applied. Honeycomb dressing placed. Telfa and Tegaderm placed over the [place] drain site. Fluff gauze and surgical bra applied.  The patient tolerated the procedure well and was taken recovery room in stable condition.

## 2017-08-20 ENCOUNTER — Encounter: Payer: Self-pay | Admitting: General Surgery

## 2017-08-20 LAB — SURGICAL PATHOLOGY

## 2017-08-21 ENCOUNTER — Other Ambulatory Visit: Payer: Self-pay | Admitting: Nurse Practitioner

## 2017-08-22 ENCOUNTER — Ambulatory Visit (INDEPENDENT_AMBULATORY_CARE_PROVIDER_SITE_OTHER): Payer: Medicaid Other | Admitting: General Surgery

## 2017-08-22 ENCOUNTER — Encounter: Payer: Self-pay | Admitting: General Surgery

## 2017-08-22 VITALS — BP 132/84 | HR 82 | Resp 12 | Ht 66.0 in | Wt 248.0 lb

## 2017-08-22 DIAGNOSIS — C50411 Malignant neoplasm of upper-outer quadrant of right female breast: Secondary | ICD-10-CM

## 2017-08-22 DIAGNOSIS — Z170 Estrogen receptor positive status [ER+]: Secondary | ICD-10-CM

## 2017-08-22 NOTE — Patient Instructions (Addendum)
The patient is aware to call back for any questions or concerns. Total or Modified Radical Mastectomy A total mastectomy and a modified radical mastectomy are types of surgery for breast cancer. If you are having a total mastectomy (simple mastectomy), your entire breast will be removed. If you are having a modified radical mastectomy, your breast and nipple will be removed along with the lymph nodes under your arm. You may also have some of the lining over the muscle tissues under your breast removed. Let your health care provider know about:  Any allergies you have.  All medicines you are taking, including vitamins, herbs, eye drops, creams, and over-the-counter medicines.  Previous problems you or members of your family have had with the use of anesthetics.  Any blood disorders you have.  Any surgeries you have had.  Any medical conditions you have. What are the risks? Generally, this is a safe procedure. However, problems may occur, including:  Pain.  Infection.  Bleeding.  Scar tissue.  Chest numbness on the side of the surgery.  Fluid buildup under the skin flaps where your breast was removed (seroma).  Sensation of throbbing or tingling.  Stress or sadness from losing your breast.  If you have the lymph nodes under your arm removed, you may have arm swelling, weakness, or numbness on the same side of your body as your surgery. What happens before the procedure?  Ask your health care provider about: ? Changing or stopping your regular medicines. This is especially important if you are taking diabetes medicines or blood thinners. ? Taking medicines such as aspirin and ibuprofen. These medicines can thin your blood. Do not take these medicines before your procedure if your health care provider instructs you not to.  Follow your health care provider's instructions about eating or drinking restrictions.  You may be checked for extra fluid around your lymph nodes  (lymphedema).  Plan to have someone take you home after the procedure. What happens during the procedure?  An IV tube will be inserted into one of your veins.  You will be given a medicine that makes you fall asleep (general anesthetic).  Your breast will be cleaned with a germ-killing solution (antiseptic).  A wide incision will be made around your nipple. The skin and nipple inside the incision will be removed along with all breast tissue.  If you are having a modified radical mastectomy: ? The lining over your chest muscles will be removed. ? The incision may be extended to reach the lymph nodes under your arm, or a second incision may be made. ? The lymph nodes will be removed.  You may have a drainage tube inserted into your incision to collect fluid that builds up after surgery. This tube is connected to a suction bulb.  Your incision or incisions will be closed with stitches (sutures).  A bandage (dressing) will be placed over your breast and under your arm. The procedure may vary among health care providers and hospitals. What happens after the procedure?  You will be moved to a recovery area.  Your blood pressure, heart rate, breathing rate, and blood oxygen level will be monitored often until the medicines you were given have worn off.  You will be given pain medicine as needed.  After a while, you will be taken to a hospital room.  You will be encouraged to get up and walk as soon as you can.  Your IV tube can be removed when you are able to eat  and drink.  Your drain may be removed before you go home from the hospital, or you may be sent home with your drain and suction bulb. This information is not intended to replace advice given to you by your health care provider. Make sure you discuss any questions you have with your health care provider. Document Released: 08/01/2001 Document Revised: 07/13/2016 Document Reviewed: 07/22/2014 Elsevier Interactive Patient  Education  [employer].

## 2017-08-22 NOTE — Progress Notes (Signed)
Patient ID: Chelsea Hudson, female   DOB: Oct 06, 1966, 51 y.o.   MRN: [id number]  Chief Complaint  Patient presents with  . Routine Post Op    HPI Chelsea Hudson is a 51 y.o. female.  Here today for postoperative visit, right partial mastectomy on 07-30-17, she states she is doing well. She is still having some pain to the right breast upper breast area. She states drainage has been 10 ml or less twice a day(forgot drain sheet).  Denies any gastrointestinal issues, bowels are moving regular.  The patient reported drainage volume is down to about 10 mL twice a day. She is here with her mother, Chelsea Hudson.  HPI  Past Medical History:  Diagnosis Date  . Arthritis    SHOULDER  . Cancer ([place]) 02/28/2017   INVASIVE MAMMARY CARCINOMA WITH MUCINOUS FEATURES.  . Colonic diverticular abscess 06/21/2017   Colonoscopy 06/25/2017: No evidence of malignancy.  . Hypertension   . Irregular heart beat    PT STATES IT "SKIPS A BEAT"   . Obesity   . Personal history of chemotherapy     Past Surgical History:  Procedure Laterality Date  . ANKLE SURGERY    . BREAST BIOPSY Right 02/28/2017   INVASIVE MAMMARY CARCINOMA WITH MUCINOUS FEATURES.  Marland Kitchen BREAST CYST ASPIRATION Right    NEG  . COLONOSCOPY WITH PROPOFOL N/A 06/25/2017   Procedure: COLONOSCOPY WITH PROPOFOL;  Surgeon: Jonathon Bellows, MD;  Location: [facility] ENDOSCOPY;  Service: Gastroenterology;  Laterality: N/A;  . MASTECTOMY, PARTIAL Right 07/30/2017   Procedure: MASTECTOMY PARTIAL;  Surgeon: Robert Bellow, MD;  Location: [facility] ORS;  Service: General;  Laterality: Right;  . PORTACATH PLACEMENT Left 03/15/2017   Procedure: INSERTION PORT-A-CATH;  Surgeon: Robert Bellow, MD;  Location: [facility] ORS;  Service: General;  Laterality: Left;  . RE-EXCISION OF BREAST LUMPECTOMY Right 08/17/2017    INVASIVE CARCINOMA EXTENDS TO NEW LATERAL MARGIN. /RE-EXCISION OF BREAST LUMPECTOMY;: Glenwood Revoir, Forest Gleason, MD;  [facility] ORS; General;  Laterality: Right;  .  SENTINEL NODE BIOPSY Right 07/30/2017   Procedure: SENTINEL NODE BIOPSY;  Surgeon: Robert Bellow, MD;  Location: [facility] ORS;  Service: General;  Laterality: Right;    Family History  Problem Relation Age of Onset  . Diabetes Father   . Stroke Father   . Breast cancer Mother 50  . Brain cancer Maternal Aunt 60  . Colon cancer Neg Hx     Social History Social History  Substance Use Topics  . Smoking status: Current Every Day Smoker    Packs/day: 0.25    Years: 25.00    Types: Cigarettes  . Smokeless tobacco: Never Used  . Alcohol use Yes     Comment: occas    Allergies  Allergen Reactions  . Other Hives and Itching    [name]     Current Outpatient Prescriptions  Medication Sig Dispense Refill  . BANOPHEN 25 MG capsule TAKE 1 TABLET (25 MG TOTAL) BY MOUTH AT BEDTIME AS NEEDED FOR SLEEP. 30 capsule 0  . gabapentin (NEURONTIN) 100 MG capsule Take 1 capsule (100 mg total) by mouth 3 (three) times daily. OR 3 tablets once daily at bedtime. (Patient taking differently: Take 300 mg by mouth at bedtime. OR 3 tablets once daily at bedtime.) 90 capsule 3  . HYDROcodone-acetaminophen (NORCO) 5-325 MG tablet Take 1-2 tablets by mouth every 4 (four) hours as  needed for moderate pain. Take no more than 10 tablets/ day. 30 tablet 0  . lidocaine-prilocaine (EMLA) cream Apply to affected area once (Patient taking differently: Apply 1 application topically as needed (befor Dr appointments). Apply to affected area once) 30 g 3  . ondansetron (ZOFRAN) 8 MG tablet Take 1 tablet (8 mg total) by mouth 2 (two) times daily as needed. 60 tablet 3  . triamterene-hydrochlorothiazide (MAXZIDE-25) 37.5-25 MG tablet Take 1 tablet by mouth daily. (Patient taking differently: Take 1 tablet by mouth at bedtime. ) 90 tablet 1   No current facility-administered medications for this visit.     Review of  Systems Review of Systems  Constitutional: Negative.   Respiratory: Negative.   Cardiovascular: Negative.     Blood pressure 132/84, pulse 82, resp. rate 12, height 5\' 6"  (1.676 m), weight 248 lb (112.5 kg), last menstrual period 01/31/2014.  Physical Exam Physical Exam  Constitutional: She is oriented to person, place, and time. She appears well-developed and well-nourished.  HENT:  Mouth/Throat: Oropharynx is clear and moist.  Eyes: Conjunctivae are normal. No scleral icterus.  Neck: Neck supple.  Cardiovascular: Normal rate, regular rhythm and normal heart sounds.   Pulmonary/Chest: Effort normal and breath sounds normal.  Right breast < left breast, 1 cup size. Incision clean and drain removed  Lymphadenopathy:    She has no cervical adenopathy.  Neurological: She is alert and oriented to person, place, and time.  Skin: Skin is warm and dry.  Psychiatric: Her behavior is normal.  Axillary drain site clean. Drain removed without incident.  Data Reviewed 08/17/2017 reexcision including an additional 3 cm lateral margin: DIAGNOSIS:  A. BREAST, RIGHT; RE-EXCISION:  - RESIDUAL INVASIVE MAMMARY CARCINOMA ADJACENT TO BIOPSY CAVITY.  - RESIDUAL DUCTAL CARCINOMA IN SITU.  - EXTENSIVE ANGIOLYMPHATIC INVASION WITH MICROPAPILLARY MORPHOLOGY.  - INVASIVE CARCINOMA EXTENDS TO NEW LATERAL MARGIN.   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    Plan    Options for management include reexcision to attempt to clear the lateral margin versus mastectomy. Pros and cons of each reviewed.     Pt wishes to proceed with right mastectomy.  The patient brought up contralateral mastectomy as recommended by her cousin. This was strongly discouraged in light of the extensive disease now identified and the possibility of additional chemotherapy after surgery. A complication in dealing with a noninvolved breast which would delay ongoing treatment could be  catastrophic.  Patient briefly touched upon reconstruction. This also should be delayed pending resolution of her present disease process. She will likely require prosthesis based reconstruction and this surgical field we'll bolus assuredly be radiated based on the extensive disease in the axilla and the breast.  HPI, Physical Exam, Assessment and Plan have been scribed under the direction and in the presence of Robert Bellow, MD. Karie Fetch, RN  I have completed the exam and reviewed the above documentation for accuracy and completeness.  I agree with the above.  Haematologist has been used and any errors in dictation or transcription are unintentional.  Hervey Ard, M.D., F.A.C.S.  Robert Bellow 08/23/2017, 7:31 AM  Patient's surgery has been scheduled for 09-04-17 at [facility].   Dominga Ferry, CMA

## 2017-08-23 ENCOUNTER — Encounter: Payer: Self-pay | Admitting: Nurse Practitioner

## 2017-08-23 ENCOUNTER — Ambulatory Visit (INDEPENDENT_AMBULATORY_CARE_PROVIDER_SITE_OTHER): Payer: Self-pay | Admitting: Nurse Practitioner

## 2017-08-23 VITALS — BP 135/84 | HR 90 | Temp 98.5°F | Ht 66.0 in | Wt 248.0 lb

## 2017-08-23 DIAGNOSIS — C50411 Malignant neoplasm of upper-outer quadrant of right female breast: Secondary | ICD-10-CM

## 2017-08-23 DIAGNOSIS — Z170 Estrogen receptor positive status [ER+]: Secondary | ICD-10-CM

## 2017-08-23 DIAGNOSIS — G620 Drug-induced polyneuropathy: Secondary | ICD-10-CM

## 2017-08-23 DIAGNOSIS — T451X5A Adverse effect of antineoplastic and immunosuppressive drugs, initial encounter: Secondary | ICD-10-CM

## 2017-08-23 DIAGNOSIS — M7989 Other specified soft tissue disorders: Secondary | ICD-10-CM

## 2017-08-23 MED ORDER — TRUFORM ARM SLEEVE L 20-30MMHG MISC: 1.0000 | Freq: Every day | 0 refills | Status: DC

## 2017-08-23 NOTE — Assessment & Plan Note (Addendum)
Pt w/ active treatment in process.  Has completed initial chemotherapy and is now undergoing surgical treatment w/ hx of two breast resection surgeries.  Both pathology reports showed positive margins. Pt now needs total right mastectomy.  Pt desires discussion about double mastectomy.  Plan: 1. Discussed in detail that surgical breast exploration is not an option.  Either will be bilateral mastectomy or right only mastectomy.  Mammogram did not show any breast cancer.  PET scan has not revealed any breast cancer of left breast. Discussed that imaging is truly the only option for exploration as she suggests outside of biopsy/resection which are not indicated.  Utility of MRI for left breast may be present, but likely not significantly informative beyond existing imaging studies.  Also could elect to have double mastectomy without knowledge of presence of breast CA depending on surgeon's determination of risk versus benefit to perform bilateral mastectomy in a single operation. - Pt verbalizes understanding, but still desires second opinion. 2. Referral placed to [facility] Dr. Donne Hazel. 3. Follow up w/ PCP prn, oncology and surgery as scheduled.

## 2017-08-23 NOTE — Progress Notes (Signed)
Subjective:    Patient ID: Chelsea Hudson, female    DOB: 1966/03/18, 51 y.o.   MRN: [id number]  Chelsea Hudson is a 51 y.o. female presenting on 08/23/2017 for Breast Cancer (pt requesting a second opinion )   HPI Breast Ca Pt notes wants second opinion about mastectomy surgery. Pt may have poor understanding about what Dr. Bary Hudson, General surgeon, can do to "find out if there is any breast cancer in left breast" as patient has not yet committed to a decision for elective bilateral mastectomy.  Based on discussion, pt wants a surgical exploration of left breast w/ option to remove it when she reports for right mastectomy for Stage III-IV breast CA.  Dr. Bary Hudson has discussed w/ patient and written in office notes that he is not recommending any surgery for left breast at this time r/t avoiding complications since right breast disease is extensive.  Pt has had breast sparing surgery x 2 by Dr. Bary Hudson w/ plan for right mastectomy in 2 weeks. Pt notes good pain control except later in evening w/ more intense pain.  Not currently able to work.  Just removed JP drain.   - pt has right arm swelling w/ pain & tingling of upper arm from shoulder/axilla to elbow.  Pain is worst w/ pressure and palpation.  Neuropathy Fingers and toes still have some tingling, but is not worsening since stopping chemo.  Is still taking gabapentin w/ some relief.  Social History  Substance Use Topics  . Smoking status: Current Every Day Smoker    Packs/day: 0.25    Years: 25.00    Types: Cigarettes  . Smokeless tobacco: Never Used  . Alcohol use Yes     Comment: occas    Review of Systems Per HPI unless specifically indicated above     Objective:    BP 135/84 (BP Location: Left Arm, Patient Position: Sitting, Cuff Size: Large)   Pulse 90   Temp 98.5 F (36.9 C) (Oral)   Ht 5\' 6"  (1.676 m)   Wt 248 lb (112.5 kg)   LMP 01/31/2014 (Approximate) Comment: LMP was 3 years ago.  BMI 40.03 kg/m   Wt  Readings from Last 3 Encounters:  08/23/17 248 lb (112.5 kg)  08/22/17 248 lb (112.5 kg)  08/17/17 248 lb (112.5 kg)    Physical Exam  General - obese, well-appearing, NAD HEENT - Normocephalic, atraumatic Neck - supple, non-tender, no LAD Heart - RRR, no murmurs heard Lungs - Clear throughout all lobes, no wheezing, crackles, or rhonchi. Normal work of breathing. Extremeties - R arm tender to palpation w/ +1 to +2 non-pitting edema.  Bilaterally cap refill < 2 seconds, peripheral pulses intact +2  Skin - warm, dry Neuro - awake, alert, oriented x3, normal gait Psych - Normal mood and affect, normal behavior   Results for orders placed or performed during the hospital encounter of 08/17/17  Surgical pathology  Result Value Ref Range   SURGICAL PATHOLOGY      Surgical Pathology CASE: [id number] PATIENT: Chelsea Hudson Surgical Pathology Report     SPECIMEN SUBMITTED: A. Breast, right; re-excision  CLINICAL HISTORY: ypT1c ypN3a right breast cancer with positive lateral margin on primary excision  PRE-OPERATIVE DIAGNOSIS: Right breast cancer  POST-OPERATIVE DIAGNOSIS: Same as pre-op     DIAGNOSIS: A. BREAST, RIGHT; RE-EXCISION: - RESIDUAL INVASIVE MAMMARY CARCINOMA ADJACENT TO BIOPSY CAVITY. - RESIDUAL DUCTAL CARCINOMA IN SITU. - EXTENSIVE ANGIOLYMPHATIC INVASION WITH MICROPAPILLARY MORPHOLOGY. - INVASIVE CARCINOMA EXTENDS TO NEW  LATERAL MARGIN.  Comment: Residual carcinoma is present adjacent to the biopsy cavity along the lateral / inferior aspect. The largest linear focus measures 15 mm and multifocally involves the lateral margin. Given these findings the AJCC staining of ypT2 may be more appropriate. These findings were discussed with Dr. Bary Hudson on 08/20/2017.  GROSS DESCRIPTION:  A. Labeled: ree xcision right breast  Time in fixative:   11:10 AM  Cold Ischemic Time: 25 minutes  Total formalin fixation time 9.5 hours  Type of specimen:  excision  Location of specimen: right  Size of specimen: 10.5 (medial-lateral) by 5.7 (anterior-posterior) by 3.7 (medial-lateral) centimeter  Skin: not present  Direction of compression: not applicable  Needle localization: no  Orientation of specimen: medial, lateral, skin and deep metallic markers (oriented by the surgeon) Superior = green (surface predominantly previous biopsy cavity, true superior green overlying orange) Inferior = blue Medial = yellow Lateral = orange Posterior = black Anterior/Superficial = red  Plane of sectioning: lateral-medial  Biopsy site: biopsy site/cavity predominantly on superior aspect and biopsy cavity also present in the more superficial aspect is focally shaggy in the anterior aspect  Presence/absence of discrete mass: absent  Description of mass(es): biopsy cavity, no di screte masses  Description of remainder of tissue: yellow lobulated fibrofatty with focal fat necrosis  Other remarkable features: none noted  Tissue submitted for special investigation: not applicable  Block summary: 1-5-perpendicular lateral 6-perpendicular true superior 7-perpendicular anterior 8-perpendicular medial 9-perpendicular deep 10-perpendicular inferior   Final Diagnosis performed by Chelsea Burow, MD.  Electronically signed 08/20/2017 11:38:11AM    The electronic signature indicates that the named Attending Pathologist has evaluated the specimen  Technical component performed at [facility], [address] [place], [place] [address] Lab: [phone] Dir: Chelsea Penna. Evette Doffing, MD  Professional component performed at [facility], [facility], [place], [place], [place] [address] Lab: [phone] Dir: Chelsea Nims. Reuel Derby, MD        Assessment & Plan:   Problem List Items Addressed This Visit      Nervous and Auditory   Peripheral neuropathy due to chemotherapy (HCC)    Stable w/o worsening.  Has not had complete  resolution, but notes mild improvement.  Pt has completed chemotherapy w/ known peripheral neuropathy.    Plan: 1. Continue gabapentin for symptom control. 2. Follow up as needed.        Other   Malignant neoplasm of upper-outer quadrant of right breast in female, estrogen receptor positive ([place]) - Primary    Pt w/ active treatment in process.  Has completed initial chemotherapy and is now undergoing surgical treatment w/ hx of two breast resection surgeries.  Both pathology reports showed positive margins. Pt now needs total right mastectomy.  Pt desires discussion about double mastectomy.  Plan: 1. Discussed in detail that surgical breast exploration is not an option.  Either will be bilateral mastectomy or right only mastectomy.  Mammogram did not show any breast cancer.  PET scan has not revealed any breast cancer of left breast. Discussed that imaging is truly the only option for exploration as she suggests outside of biopsy/resection which are not indicated.  Utility of MRI for left breast may be present, but likely not significantly informative beyond existing imaging studies.  Also could elect to have double mastectomy without knowledge of presence of breast CA depending on surgeon's determination of risk versus benefit to perform bilateral mastectomy in a single operation. - Pt verbalizes understanding, but still desires second opinion. 2.  Referral placed to [facility] Dr. Donne Hazel. 3. Follow up w/ PCP prn, oncology and surgery as scheduled.       Relevant Medications   Elastic Bandages & Supports (TRUFORM ARM SLEEVE L 20-30MMHG) MISC   Other Relevant Orders   Ambulatory referral to General Surgery    Other Visit Diagnoses    Swelling of right upper extremity       Likely lymphedema 2/2 lymph node dissection and r breast surgery since onset after surgery.   Plan: 1. Encouraged pt to elevate arm, massage arm from fingers upward to shoulder as tolerated. 2. Wear elastic arm  compression sleeve.  Directions provided for [facility]. 3. Consider referral in future to vein and vascular, but encouraged pt to defer until complete right mastectomy has been completed and has allowed enough time for surgical recovery (8-12 weeks minimum). 4. Follow up as needed.     Relevant Medications   Elastic Bandages & Supports (TRUFORM ARM SLEEVE L 20-30MMHG) MISC      Meds ordered this encounter  Medications  . Elastic Bandages & Supports (TRUFORM ARM SLEEVE L 20-30MMHG) MISC    Sig: 1 Device by Does not apply route daily.    Dispense:  1 each    Refill:  0    Order Specific Question:   Supervising Provider    Answer:   Olin Hauser [[id number]]   Follow up plan: Return if symptoms worsen or fail to improve.  Cassell Smiles, DNP, AGPCNP-BC Adult Gerontology Primary Care Nurse Practitioner [facility] 08/23/2017, 1:45 PM

## 2017-08-23 NOTE — Assessment & Plan Note (Signed)
Stable w/o worsening.  Has not had complete resolution, but notes mild improvement.  Pt has completed chemotherapy w/ known peripheral neuropathy.    Plan: 1. Continue gabapentin for symptom control. 2. Follow up as needed.

## 2017-08-23 NOTE — Patient Instructions (Addendum)
Chelsea Hudson, Thank you for coming in to clinic today.  1. For your swelling in your right arm: - Get an arm compression sleeve - We can also send you to vein and vascular specialists as well.  Call if the sleeve is not helping.  [facility] can measure you for this. - [place], [place]   2. I am sending a referral to [facility] for you to see Dr. Donne Hazel for your second opinion.  You will get a phone call from them.  If you have not heard from them by end of day Monday, Call this clinic on Tuesday.  Please schedule a follow-up appointment with Cassell Smiles, AGNP. Return if symptoms worsen or fail to improve.  If you have any other questions or concerns, please feel free to call the clinic or send a message through [facility]. You may also schedule an earlier appointment if necessary.  You will receive a survey after today's visit either digitally by e-mail or paper by [employer]. Your experiences and feedback matter to [place].  Please respond so we know how we are doing as we provide care for you.   Cassell Smiles, DNP, AGNP-BC Adult Gerontology Nurse Practitioner [facility]

## 2017-08-28 ENCOUNTER — Telehealth: Payer: Self-pay | Admitting: General Surgery

## 2017-08-28 ENCOUNTER — Inpatient Hospital Stay: Admission: RE | Admit: 2017-08-28 | Payer: Medicaid Other | Source: Ambulatory Visit

## 2017-08-28 NOTE — Telephone Encounter (Signed)
HEATHER CALLED CARY-LYN TO LET HER KNOW WHEN SHE CALLED PATIENT TO DO PRE-OP THE PATIENT STATED SHE WASN'T GOING TO HAVE THE SURGERY.SHE WAS GOING FOR A SECOND OPINION AT [facility].I CALLED PATIENT AND SPOKE WITH HER.SHE CONFIRMED THE ABOVE AND DID NOT WANT TO RESCHEDULE.

## 2017-08-28 NOTE — Telephone Encounter (Signed)
OR notified of surgery cancellation.

## 2017-08-29 ENCOUNTER — Telehealth: Payer: Self-pay

## 2017-08-29 NOTE — Telephone Encounter (Signed)
Upon reviewing patients chart she is due for port a cath flush.  T/C to patient and instructed her that port a cath needs to be flushed every 6 to 8 weeks as long as she continues to have a port.  Informed schedulers to schedule patient a port flush ASAP and 6 to 8 weeks thereafter.

## 2017-09-04 ENCOUNTER — Ambulatory Visit: Admission: RE | Admit: 2017-09-04 | Payer: Medicaid Other | Source: Ambulatory Visit | Admitting: General Surgery

## 2017-09-04 ENCOUNTER — Encounter: Admission: RE | Payer: Self-pay | Source: Ambulatory Visit

## 2017-09-04 SURGERY — SIMPLE MASTECTOMY
Anesthesia: Choice | Laterality: Right

## 2017-09-06 ENCOUNTER — Other Ambulatory Visit: Payer: Self-pay | Admitting: Oncology

## 2017-09-11 ENCOUNTER — Other Ambulatory Visit: Payer: Self-pay | Admitting: General Surgery

## 2017-09-11 DIAGNOSIS — C50212 Malignant neoplasm of upper-inner quadrant of left female breast: Secondary | ICD-10-CM

## 2017-09-11 DIAGNOSIS — Z171 Estrogen receptor negative status [ER-]: Principal | ICD-10-CM

## 2017-09-11 NOTE — Progress Notes (Signed)
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  INTERVAL HISTORY: Patient returns to clinic today for further evaluation and post surgical follow-up. Patient had a reexcision on August 17, 2017 that again revealed residual invasive mammary carcinoma extending to the margin. She had a second opinion at [facility] that agreed that a total mastectomy was necessary at this point. Patient has this scheduled for September 28, 2017. She currently feels well and is asymptomatic. She denies any peripheral neuropathy or other neurologic complaints. She denies any recent fevers or illnesses. She has a good appetite and denies weight loss. She has no chest pain or shortness of breath. She denies any nausea, vomiting, constipation, or diarrhea. She has no urinary complaints. Patient offers no further specific complaints today.  REVIEW OF SYSTEMS:   Review of Systems  Constitutional: Negative.  Negative for fever, malaise/fatigue and weight loss.  HENT: Negative.  Negative for congestion.   Respiratory: Negative.  Negative for cough and shortness of breath.   Cardiovascular: Negative.  Negative for chest pain and leg swelling.  Gastrointestinal: Negative.  Negative for abdominal pain, nausea and vomiting.  Genitourinary: Negative.   Skin: Negative.  Negative for rash.  Neurological: Negative.  Negative for sensory change and weakness.  Psychiatric/Behavioral: Negative.  The patient is not nervous/anxious.     As per HPI. Otherwise, a complete review of systems is  negative.  PAST MEDICAL HISTORY: Past Medical History:  Diagnosis Date  . Arthritis    SHOULDER  . Cancer ([place]) 02/28/2017   INVASIVE MAMMARY CARCINOMA WITH MUCINOUS FEATURES.  . Colonic diverticular abscess 06/21/2017   Colonoscopy 06/25/2017: No evidence of malignancy.  . Hypertension   . Irregular heart beat    PT STATES IT "SKIPS A BEAT"   . Obesity   . Personal history of chemotherapy     PAST SURGICAL HISTORY: Past Surgical History:  Procedure Laterality Date  . ANKLE SURGERY    . BREAST BIOPSY Right 02/28/2017   INVASIVE MAMMARY CARCINOMA WITH MUCINOUS FEATURES.  Marland Kitchen BREAST CYST ASPIRATION Right    NEG  . COLONOSCOPY WITH PROPOFOL N/A 06/25/2017   Procedure: COLONOSCOPY WITH PROPOFOL;  Surgeon: Jonathon Bellows, MD;  Location: [facility] ENDOSCOPY;  Service: Gastroenterology;  Laterality: N/A;  . MASTECTOMY, PARTIAL Right 07/30/2017   Procedure: MASTECTOMY PARTIAL;  Surgeon: Robert Bellow, MD;  Location: [facility] ORS;  Service: General;  Laterality: Right;  . PORTACATH PLACEMENT Left 03/15/2017   Procedure: INSERTION PORT-A-CATH;  Surgeon: Robert Bellow, MD;  Location: [facility] ORS;  Service: General;  Laterality: Left;  . RE-EXCISION OF BREAST LUMPECTOMY Right 08/17/2017    INVASIVE CARCINOMA EXTENDS TO NEW LATERAL MARGIN. /RE-EXCISION OF BREAST LUMPECTOMY;: Byrnett, Forest Gleason, MD;  [facility] ORS; General;  Laterality: Right;  . SENTINEL NODE BIOPSY Right 07/30/2017   Procedure: SENTINEL NODE BIOPSY;  Surgeon: Robert Bellow, MD;  Location: [facility] ORS;  Service: General;  Laterality: Right;    FAMILY HISTORY: Family History  Problem Relation Age of Onset  . Diabetes Father   . Stroke Father   . Breast cancer Mother 42  .  Brain cancer Maternal Aunt 60  . Colon cancer Neg Hx     ADVANCED DIRECTIVES (Y/N):  N  HEALTH MAINTENANCE: Social History  Substance Use Topics  . Smoking status: Current Every Day Smoker    Packs/day: 0.25    Years: 25.00    Types: Cigarettes  .  Smokeless tobacco: Never Used  . Alcohol use Yes     Comment: occas     Colonoscopy:  PAP:  Bone density:  Lipid panel:  Allergies  Allergen Reactions  . Other Hives and Itching    [name]     Current Outpatient Prescriptions  Medication Sig Dispense Refill  . BANOPHEN 25 MG capsule TAKE 1 TABLET (25 MG TOTAL) BY MOUTH AT BEDTIME AS NEEDED FOR SLEEP. 30 capsule 0  . Elastic Bandages & Supports (TRUFORM ARM SLEEVE L 20-30MMHG) MISC 1 Device by Does not apply route daily. 1 each 0  . gabapentin (NEURONTIN) 100 MG capsule Take 1 capsule (100 mg total) by mouth 3 (three) times daily. OR 3 tablets once daily at bedtime. (Patient taking differently: Take 300 mg by mouth at bedtime. OR 3 tablets once daily at bedtime.) 90 capsule 3  . HYDROcodone-acetaminophen (NORCO) 5-325 MG tablet Take 1-2 tablets by mouth every 4 (four) hours as needed for moderate pain. Take no more than 10 tablets/ day. 30 tablet 0  . lidocaine-prilocaine (EMLA) cream Apply to affected area once (Patient taking differently: Apply 1 application topically as needed (befor Dr appointments). Apply to affected area once) 30 g 3  . ondansetron (ZOFRAN) 8 MG tablet Take 1 tablet (8 mg total) by mouth 2 (two) times daily as needed. 60 tablet 3  . triamterene-hydrochlorothiazide (MAXZIDE-25) 37.5-25 MG tablet Take 1 tablet by mouth daily. (Patient taking differently: Take 1 tablet by mouth at bedtime. ) 90 tablet 1   No current facility-administered medications for this visit.     OBJECTIVE: Vitals:   09/12/17 1134  BP: (!) 128/94  Pulse: 76  Resp: 18  Temp: (!) 97.1 F (36.2 C)     Body mass index is 40.3 kg/m.    ECOG FS:0 - Asymptomatic  General: Well-developed, well-nourished, no acute distress. Eyes: Pink conjunctiva, anicteric sclera. Breasts: Well healing surgical scar. Heart: Regular rate and rhythm. No rubs,  murmurs, or gallops. Abdomen: Soft, tender, non-distended. No organomegaly noted, normoactive bowel sounds. Musculoskeletal: No edema, cyanosis, or clubbing. Neuro: Alert, answering all questions appropriately. Cranial nerves grossly intact. Skin: No rashes or petechiae noted. Psych: Normal affect.  LAB RESULTS:  Lab Results  Component Value Date   NA 137 09/12/2017   K 3.5 09/12/2017   CL 102 09/12/2017   CO2 27 09/12/2017   GLUCOSE 141 (H) 09/12/2017   BUN 12 09/12/2017   CREATININE 0.75 09/12/2017   CALCIUM 9.2 09/12/2017   PROT 7.1 09/12/2017   ALBUMIN 3.4 (L) 09/12/2017   AST 21 09/12/2017   ALT 23 09/12/2017   ALKPHOS 95 09/12/2017   BILITOT 0.4 09/12/2017   GFRNONAA >60 09/12/2017   GFRAA >60 09/12/2017    Lab Results  Component Value Date   WBC 5.4 09/12/2017   NEUTROABS 4.1 09/12/2017   HGB 11.5 (L) 09/12/2017   HCT 35.5 09/12/2017   MCV 82.2 09/12/2017   PLT 328 09/12/2017     STUDIES: No results found.  ASSESSMENT: Pathologic stage T1c N3a M0,  ER/PR positive, HER-2  negative invasive carcinoma of the upper outer quadrant of the right breast.  PLAN:   1. 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 Treatment was then discontinued secondary to worsening peripheral neuropathy. Patient's initial surgery was on July 30, 2017. Patient had a reexcision on August 17, 2017 that again revealed residual invasive mammary carcinoma extending to the margin. She had a second opinion at [facility] that agreed that a total mastectomy was necessary at this point. Patient has this scheduled for September 28, 2017. Now that patient has to proceed with total mastectomy, adjuvant XRT may not be necessary but will await final pathology given her significant nodal disease. Given the ER/PR status of her tumor she will benefit from either  tamoxifen or an aromatase inhibitor for total 5 years at the conclusion of her XRT.  Follow-up week of November 26 for further evaluation and additional treatment planning.  2. Genetic testing: Negative. 3. Left upper and lower abdominal pain: Resolved. Patient had colonoscopy that did not reveal any malignancy, but did reveal an abscess.    4. Anemia: Stable, monitor. 5. Joint pain: Improved. Patient does not complain of pain today.  6. Peripheral neuropathy: Resolved.  Patient expressed understanding and was in agreement with this plan. She also understands that She can call clinic at any time with any questions, concerns, or complaints.   Cancer Staging Malignant neoplasm of upper-outer quadrant of right breast in female, estrogen receptor positive ([place]) Staging form: Breast, AJCC 8th Edition - Clinical stage from 03/05/2017: Stage IIA (cT2, cN2a, cM0, G2, ER: Positive, PR: Positive, HER2: Negative) - Signed by Lloyd Huger, MD on 08/10/2017 - Pathologic stage from 08/10/2017: No Stage Recommended (ypT1c, pN3a, cM0, G2, ER: Positive, PR: Positive, HER2: Negative) - Signed by Lloyd Huger, MD on 08/10/2017   Lloyd Huger, MD   09/12/2017 12:06 PM

## 2017-09-12 ENCOUNTER — Inpatient Hospital Stay (HOSPITAL_BASED_OUTPATIENT_CLINIC_OR_DEPARTMENT_OTHER): Payer: Medicaid Other | Admitting: Oncology

## 2017-09-12 ENCOUNTER — Inpatient Hospital Stay: Payer: Medicaid Other | Attending: Oncology

## 2017-09-12 VITALS — BP 128/94 | HR 76 | Temp 97.1°F | Resp 18 | Wt 249.7 lb

## 2017-09-12 DIAGNOSIS — Z9221 Personal history of antineoplastic chemotherapy: Secondary | ICD-10-CM | POA: Diagnosis not present

## 2017-09-12 DIAGNOSIS — D649 Anemia, unspecified: Secondary | ICD-10-CM | POA: Diagnosis not present

## 2017-09-12 DIAGNOSIS — E669 Obesity, unspecified: Secondary | ICD-10-CM | POA: Insufficient documentation

## 2017-09-12 DIAGNOSIS — G629 Polyneuropathy, unspecified: Secondary | ICD-10-CM

## 2017-09-12 DIAGNOSIS — Z79899 Other long term (current) drug therapy: Secondary | ICD-10-CM | POA: Insufficient documentation

## 2017-09-12 DIAGNOSIS — I499 Cardiac arrhythmia, unspecified: Secondary | ICD-10-CM

## 2017-09-12 DIAGNOSIS — C50411 Malignant neoplasm of upper-outer quadrant of right female breast: Secondary | ICD-10-CM

## 2017-09-12 DIAGNOSIS — F17210 Nicotine dependence, cigarettes, uncomplicated: Secondary | ICD-10-CM

## 2017-09-12 DIAGNOSIS — Z803 Family history of malignant neoplasm of breast: Secondary | ICD-10-CM | POA: Insufficient documentation

## 2017-09-12 DIAGNOSIS — Z808 Family history of malignant neoplasm of other organs or systems: Secondary | ICD-10-CM | POA: Diagnosis not present

## 2017-09-12 DIAGNOSIS — Z170 Estrogen receptor positive status [ER+]: Secondary | ICD-10-CM | POA: Diagnosis not present

## 2017-09-12 DIAGNOSIS — I10 Essential (primary) hypertension: Secondary | ICD-10-CM | POA: Insufficient documentation

## 2017-09-12 DIAGNOSIS — Z95828 Presence of other vascular implants and grafts: Secondary | ICD-10-CM

## 2017-09-12 LAB — COMPREHENSIVE METABOLIC PANEL
ALBUMIN: 3.4 g/dL — AB (ref 3.5–5.0)
ALK PHOS: 95 U/L (ref 38–126)
ALT: 23 U/L (ref 14–54)
AST: 21 U/L (ref 15–41)
Anion gap: 8 (ref 5–15)
BILIRUBIN TOTAL: 0.4 mg/dL (ref 0.3–1.2)
BUN: 12 mg/dL (ref 6–20)
CO2: 27 mmol/L (ref 22–32)
Calcium: 9.2 mg/dL (ref 8.9–10.3)
Chloride: 102 mmol/L (ref 101–111)
Creatinine, Ser: 0.75 mg/dL (ref 0.44–1.00)
GFR calc Af Amer: >60 mL/min (ref >60)
GFR calc non Af Amer: >60 mL/min (ref >60)
GLUCOSE: 141 mg/dL — AB (ref 65–99)
POTASSIUM: 3.5 mmol/L (ref 3.5–5.1)
Sodium: 137 mmol/L (ref 135–145)
TOTAL PROTEIN: 7.1 g/dL (ref 6.5–8.1)

## 2017-09-12 LAB — CBC WITH DIFFERENTIAL/PLATELET
BASOS ABS: 0 10*3/uL (ref 0–0.1)
BASOS PCT: 0 %
Eosinophils Absolute: 0.2 10*3/uL (ref 0–0.7)
Eosinophils Relative: 3 %
HEMATOCRIT: 35.5 % (ref 35.0–47.0)
HEMOGLOBIN: 11.5 g/dL — AB (ref 12.0–16.0)
Lymphocytes Relative: 15 %
Lymphs Abs: 0.8 10*3/uL — ABNORMAL LOW (ref 1.0–3.6)
MCH: 26.7 pg (ref 26.0–34.0)
MCHC: 32.5 g/dL (ref 32.0–36.0)
MCV: 82.2 fL (ref 80.0–100.0)
Monocytes Absolute: 0.3 10*3/uL (ref 0.2–0.9)
Monocytes Relative: 6 %
NEUTROS ABS: 4.1 10*3/uL (ref 1.4–6.5)
NEUTROS PCT: 76 %
Platelets: 328 10*3/uL (ref 150–440)
RBC: 4.32 MIL/uL (ref 3.80–5.20)
RDW: 16.8 % — AB (ref 11.5–14.5)
WBC: 5.4 10*3/uL (ref 3.6–11.0)

## 2017-09-12 MED ORDER — HEPARIN SOD (PORK) LOCK FLUSH 100 UNIT/ML IV SOLN
500.0000 [IU] | INTRAVENOUS | Status: AC | PRN
  Administered 2017-09-12: 500 [IU]

## 2017-09-12 MED ORDER — SODIUM CHLORIDE 0.9% FLUSH
10.0000 mL | INTRAVENOUS | Status: AC | PRN
  Administered 2017-09-12: 10 mL
  Filled 2017-09-12: qty 10

## 2017-09-21 ENCOUNTER — Encounter
Admission: RE | Admit: 2017-09-21 | Discharge: 2017-09-21 | Disposition: A | Discharge status: Home / Self Care | Payer: Medicaid Other | Source: Ambulatory Visit | Attending: General Surgery | Admitting: General Surgery

## 2017-09-21 ENCOUNTER — Other Ambulatory Visit: Payer: Self-pay | Admitting: Nurse Practitioner

## 2017-09-21 MED ORDER — DIPHENHYDRAMINE HCL 25 MG PO TABS: 25.0000 mg | ORAL_TABLET | Freq: Every evening | ORAL | 1 refills | Status: DC | PRN

## 2017-09-21 NOTE — Patient Instructions (Signed)
  Your procedure is scheduled on: 09-28-17 Report to Same Day Surgery 2nd floor medical mall [facility] Entrance-take elevator on left to 2nd floor.  Check in with surgery information desk.) To find out your arrival time please call [phone] between 1PM - 3PM on 09-27-17  Remember: Instructions that are not followed completely may result in serious medical risk, up to and including death, or upon the discretion of your surgeon and anesthesiologist your surgery may need to be rescheduled.    _x___ 1. Do not eat food after midnight the night before your procedure. You may drink clear liquids up to 2 hours before you are scheduled to arrive at the hospital for your procedure.  Do not drink clear liquids within 2 hours of your scheduled arrival to the hospital.  Clear liquids include  --Water or Apple juice without pulp  --Clear carbohydrate beverage such as ClearFast or Gatorade  --Black Coffee or Clear Tea (No milk, no creamers, do not add anything to the coffee or Tea   No gum chewing or hard candies.     __x__ 2. No Alcohol for 24 hours before or after surgery.   __x__3. No Smoking for 24 prior to surgery.   ____  4. Bring all medications with you on the day of surgery if instructed.    __x__ 5. Notify your doctor if there is any change in your medical condition     (cold, fever, infections).     Do not wear jewelry, make-up, hairpins, clips or nail polish.  Do not wear lotions, powders, or perfumes. You may wear deodorant.  Do not shave 48 hours prior to surgery. Men may shave face and neck.  Do not bring valuables to the hospital.    [facility] is not responsible for any belongings or valuables.               Contacts, dentures or bridgework may not be worn into surgery.  Leave your suitcase in the car. After surgery it may be brought to your room.  For patients admitted to the hospital, discharge time is determined by your treatment team.   Patients discharged the day of  surgery will not be allowed to drive home.  You will need someone to drive you home and stay with you the night of your procedure.    Please read over the following fact sheets that you were given:    ____ Take anti-hypertensive listed below, cardiac, seizure, asthma, anti-reflux and psychiatric medicines. These include:  1. NONE  2.  3.  4.  5.  6.  ____Fleets enema or Magnesium Citrate as directed.   ____ Use CHG Soap or sage wipes as directed on instruction sheet   ____ Use inhalers on the day of surgery and bring to hospital day of surgery  ____ Stop Metformin and Janumet 2 days prior to surgery.    ____ Take 1/2 of usual insulin dose the night before surgery and none on the morning surgery.   ____ Follow recommendations from Cardiologist, Pulmonologist or PCP regarding stopping Aspirin, Coumadin, Plavix ,Eliquis, Effient, or Pradaxa, and Pletal.  ____Stop Anti-inflammatories such as Advil, Aleve, Ibuprofen, Motrin, Naproxen, Naprosyn, Goodies powders or aspirin products. OK to take Tylenol   ____ Stop supplements until after surgery.   ____ Bring C-Pap to the hospital.

## 2017-09-24 ENCOUNTER — Encounter: Payer: Self-pay | Admitting: *Deleted

## 2017-09-24 NOTE — Progress Notes (Signed)
Patient called the answering service on Friday after hours. She stated that she had some questions in regards to her upcoming surgery on 09-28-17.  Called patient and left a message for her to call the office today.

## 2017-09-24 NOTE — Progress Notes (Signed)
Patient called the office back and states when she went to get a second opinion she was told that they would need to keep her overnight the day of her surgery.   The patient wants to know why we would be sending her home. She was reassured that a mastectomy is done typically as an outpatient procedure and that unless she has complications from surgery she will be going home the same day. She verbalizes understanding.   Patient was also reminded to call the day before surgery between 1 and 3 pm to get her arrival time.   She was instructed to call the office should she have further questions.

## 2017-09-27 MED ORDER — CEFAZOLIN SODIUM-DEXTROSE 2-4 GM/100ML-% IV SOLN
2.0000 g | INTRAVENOUS | Status: AC
  Administered 2017-09-28: 2 g via INTRAVENOUS

## 2017-09-28 ENCOUNTER — Encounter: Payer: Self-pay | Admitting: *Deleted

## 2017-09-28 ENCOUNTER — Encounter
Admission: RE | Disposition: A | Discharge status: Home / Self Care | Payer: Self-pay | Source: Ambulatory Visit | Attending: General Surgery

## 2017-09-28 ENCOUNTER — Ambulatory Visit
Admission: RE | Admit: 2017-09-28 | Discharge: 2017-09-28 | Disposition: A | Discharge status: Home / Self Care | Payer: Medicaid Other | Source: Ambulatory Visit | Attending: General Surgery | Admitting: General Surgery

## 2017-09-28 ENCOUNTER — Ambulatory Visit: Payer: Medicaid Other | Admitting: Registered Nurse

## 2017-09-28 DIAGNOSIS — C50911 Malignant neoplasm of unspecified site of right female breast: Secondary | ICD-10-CM | POA: Insufficient documentation

## 2017-09-28 DIAGNOSIS — C50212 Malignant neoplasm of upper-inner quadrant of left female breast: Secondary | ICD-10-CM

## 2017-09-28 DIAGNOSIS — Z6836 Body mass index (BMI) 36.0-36.9, adult: Secondary | ICD-10-CM | POA: Insufficient documentation

## 2017-09-28 DIAGNOSIS — E669 Obesity, unspecified: Secondary | ICD-10-CM | POA: Insufficient documentation

## 2017-09-28 DIAGNOSIS — Z8719 Personal history of other diseases of the digestive system: Secondary | ICD-10-CM | POA: Diagnosis not present

## 2017-09-28 DIAGNOSIS — F17210 Nicotine dependence, cigarettes, uncomplicated: Secondary | ICD-10-CM | POA: Diagnosis not present

## 2017-09-28 DIAGNOSIS — Z171 Estrogen receptor negative status [ER-]: Secondary | ICD-10-CM

## 2017-09-28 DIAGNOSIS — M19019 Primary osteoarthritis, unspecified shoulder: Secondary | ICD-10-CM | POA: Diagnosis not present

## 2017-09-28 DIAGNOSIS — I10 Essential (primary) hypertension: Secondary | ICD-10-CM | POA: Diagnosis not present

## 2017-09-28 DIAGNOSIS — Z9221 Personal history of antineoplastic chemotherapy: Secondary | ICD-10-CM | POA: Diagnosis not present

## 2017-09-28 DIAGNOSIS — Z79899 Other long term (current) drug therapy: Secondary | ICD-10-CM | POA: Diagnosis not present

## 2017-09-28 DIAGNOSIS — Z881 Allergy status to other antibiotic agents status: Secondary | ICD-10-CM | POA: Insufficient documentation

## 2017-09-28 DIAGNOSIS — I499 Cardiac arrhythmia, unspecified: Secondary | ICD-10-CM | POA: Insufficient documentation

## 2017-09-28 DIAGNOSIS — C50211 Malignant neoplasm of upper-inner quadrant of right female breast: Secondary | ICD-10-CM | POA: Diagnosis not present

## 2017-09-28 HISTORY — PX: SIMPLE MASTECTOMY WITH AXILLARY SENTINEL NODE BIOPSY: SHX6098

## 2017-09-28 SURGERY — SIMPLE MASTECTOMY
Anesthesia: General | Site: Breast | Laterality: Right | Wound class: Clean

## 2017-09-28 MED ORDER — KETOROLAC TROMETHAMINE 30 MG/ML IJ SOLN
INTRAMUSCULAR | Status: AC
  Filled 2017-09-28: qty 1

## 2017-09-28 MED ORDER — OXYCODONE HCL 5 MG PO TABS
5.0000 mg | ORAL_TABLET | Freq: Once | ORAL | Status: AC | PRN
  Administered 2017-09-28: 5 mg via ORAL

## 2017-09-28 MED ORDER — SEVOFLURANE IN SOLN
RESPIRATORY_TRACT | Status: AC
  Filled 2017-09-28: qty 250

## 2017-09-28 MED ORDER — PROMETHAZINE HCL 25 MG/ML IJ SOLN: 6.2500 mg | INTRAMUSCULAR | Status: DC | PRN

## 2017-09-28 MED ORDER — HEPARIN SOD (PORK) LOCK FLUSH 100 UNIT/ML IV SOLN
500.0000 [IU] | Freq: Once | INTRAVENOUS | Status: AC
  Administered 2017-09-28: 500 [IU] via INTRAVENOUS

## 2017-09-28 MED ORDER — ONDANSETRON HCL 4 MG/2ML IJ SOLN
INTRAMUSCULAR | Status: AC
  Filled 2017-09-28: qty 2

## 2017-09-28 MED ORDER — MEPERIDINE HCL 50 MG/ML IJ SOLN
6.2500 mg | INTRAMUSCULAR | Status: DC | PRN
Start: 2017-09-28 — End: 2017-09-28

## 2017-09-28 MED ORDER — ONDANSETRON HCL 4 MG/2ML IJ SOLN
INTRAMUSCULAR | Status: DC | PRN
  Administered 2017-09-28: 4 mg via INTRAVENOUS

## 2017-09-28 MED ORDER — PROPOFOL 10 MG/ML IV BOLUS
INTRAVENOUS | Status: AC
  Filled 2017-09-28: qty 20

## 2017-09-28 MED ORDER — MIDAZOLAM HCL 2 MG/2ML IJ SOLN
INTRAMUSCULAR | Status: AC
  Filled 2017-09-28: qty 2

## 2017-09-28 MED ORDER — LIDOCAINE HCL (CARDIAC) 20 MG/ML IV SOLN
INTRAVENOUS | Status: DC | PRN
  Administered 2017-09-28: 100 mg via INTRAVENOUS

## 2017-09-28 MED ORDER — PROPOFOL 10 MG/ML IV BOLUS
INTRAVENOUS | Status: DC | PRN
  Administered 2017-09-28: 50 mg via INTRAVENOUS
  Administered 2017-09-28: 150 mg via INTRAVENOUS

## 2017-09-28 MED ORDER — FENTANYL CITRATE (PF) 100 MCG/2ML IJ SOLN
INTRAMUSCULAR | Status: DC | PRN
  Administered 2017-09-28 (×4): 50 ug via INTRAVENOUS

## 2017-09-28 MED ORDER — FENTANYL CITRATE (PF) 100 MCG/2ML IJ SOLN
INTRAMUSCULAR | Status: AC
  Filled 2017-09-28: qty 2

## 2017-09-28 MED ORDER — FAMOTIDINE 20 MG PO TABS
20.0000 mg | ORAL_TABLET | Freq: Once | ORAL | Status: AC
  Administered 2017-09-28: 20 mg via ORAL

## 2017-09-28 MED ORDER — DEXAMETHASONE SODIUM PHOSPHATE 10 MG/ML IJ SOLN
INTRAMUSCULAR | Status: DC | PRN
  Administered 2017-09-28: 10 mg via INTRAVENOUS

## 2017-09-28 MED ORDER — MIDAZOLAM HCL 2 MG/2ML IJ SOLN
INTRAMUSCULAR | Status: DC | PRN
  Administered 2017-09-28: 2 mg via INTRAVENOUS

## 2017-09-28 MED ORDER — ACETAMINOPHEN 10 MG/ML IV SOLN
INTRAVENOUS | Status: DC | PRN
  Administered 2017-09-28: 1000 mg via INTRAVENOUS

## 2017-09-28 MED ORDER — KETOROLAC TROMETHAMINE 30 MG/ML IJ SOLN
INTRAMUSCULAR | Status: DC | PRN
  Administered 2017-09-28: 30 mg via INTRAVENOUS

## 2017-09-28 MED ORDER — LIDOCAINE HCL (PF) 2 % IJ SOLN
INTRAMUSCULAR | Status: AC
  Filled 2017-09-28: qty 10

## 2017-09-28 MED ORDER — FENTANYL CITRATE (PF) 100 MCG/2ML IJ SOLN
25.0000 ug | INTRAMUSCULAR | Status: DC | PRN
  Administered 2017-09-28 (×3): 50 ug via INTRAVENOUS

## 2017-09-28 MED ORDER — HEPARIN SOD (PORK) LOCK FLUSH 100 UNIT/ML IV SOLN
INTRAVENOUS | Status: AC
  Administered 2017-09-28: 500 [IU] via INTRAVENOUS
  Filled 2017-09-28: qty 5

## 2017-09-28 MED ORDER — FENTANYL CITRATE (PF) 100 MCG/2ML IJ SOLN
INTRAMUSCULAR | Status: AC
  Administered 2017-09-28: 50 ug via INTRAVENOUS
  Filled 2017-09-28: qty 2

## 2017-09-28 MED ORDER — LACTATED RINGERS IV SOLN
INTRAVENOUS | Status: DC
  Administered 2017-09-28: 12:00:00 via INTRAVENOUS

## 2017-09-28 MED ORDER — GLYCOPYRROLATE 0.2 MG/ML IJ SOLN
INTRAMUSCULAR | Status: DC | PRN
  Administered 2017-09-28: 0.2 mg via INTRAVENOUS

## 2017-09-28 MED ORDER — FENTANYL CITRATE (PF) 250 MCG/5ML IJ SOLN
INTRAMUSCULAR | Status: AC
  Filled 2017-09-28: qty 5

## 2017-09-28 MED ORDER — HYDROCODONE-ACETAMINOPHEN 5-325 MG PO TABS: 1.0000 | ORAL_TABLET | ORAL | 0 refills | Status: DC | PRN

## 2017-09-28 MED ORDER — FENTANYL CITRATE (PF) 100 MCG/2ML IJ SOLN: INTRAMUSCULAR | Status: DC | PRN

## 2017-09-28 MED ORDER — KETOROLAC TROMETHAMINE 30 MG/ML IJ SOLN
INTRAMUSCULAR | Status: AC
  Filled 2017-09-28: qty 2

## 2017-09-28 MED ORDER — OXYCODONE HCL 5 MG/5ML PO SOLN: 5.0000 mg | Freq: Once | ORAL | Status: AC | PRN

## 2017-09-28 MED ORDER — OXYCODONE HCL 5 MG PO TABS
ORAL_TABLET | ORAL | Status: AC
  Filled 2017-09-28: qty 1

## 2017-09-28 MED ORDER — SUGAMMADEX SODIUM 200 MG/2ML IV SOLN
INTRAVENOUS | Status: AC
  Filled 2017-09-28: qty 2

## 2017-09-28 MED ORDER — DEXAMETHASONE SODIUM PHOSPHATE 10 MG/ML IJ SOLN
INTRAMUSCULAR | Status: AC
  Filled 2017-09-28: qty 1

## 2017-09-28 MED ORDER — ACETAMINOPHEN 10 MG/ML IV SOLN
INTRAVENOUS | Status: AC
  Filled 2017-09-28: qty 100

## 2017-09-28 SURGICAL SUPPLY — 48 items
APPLIER CLIP 11 MED OPEN (CLIP)
APPLIER CLIP 13 LRG OPEN (CLIP)
BANDAGE ELASTIC 6 LF NS (GAUZE/BANDAGES/DRESSINGS) IMPLANT
BLADE PHOTON ILLUMINATED (MISCELLANEOUS) ×3 IMPLANT
BLADE SURG 15 STRL SS SAFETY (BLADE) ×3 IMPLANT
BNDG GAUZE 4.5X4.1 6PLY STRL (MISCELLANEOUS) IMPLANT
BULB RESERV EVAC DRAIN JP 100C (MISCELLANEOUS) ×3 IMPLANT
CANISTER SUCT 1200ML W/VALVE (MISCELLANEOUS) ×3 IMPLANT
CHLORAPREP W/TINT 26ML (MISCELLANEOUS) ×3 IMPLANT
CLIP APPLIE 11 MED OPEN (CLIP) IMPLANT
CLIP APPLIE 13 LRG OPEN (CLIP) IMPLANT
CLOSURE WOUND 1/2 X4 (GAUZE/BANDAGES/DRESSINGS) ×2
CNTNR SPEC 2.5X3XGRAD LEK (MISCELLANEOUS) ×3
CONT SPEC 4OZ STER OR WHT (MISCELLANEOUS) ×6
CONTAINER SPEC 2.5X3XGRAD LEK (MISCELLANEOUS) ×3 IMPLANT
DEVICE DUBIN SPECIMEN MAMMOGRA (MISCELLANEOUS) IMPLANT
DRAIN CHANNEL JP 15F RND 16 (MISCELLANEOUS) ×3 IMPLANT
DRAPE LAPAROTOMY TRNSV 106X77 (MISCELLANEOUS) ×3 IMPLANT
DRSG TELFA 3X8 NADH (GAUZE/BANDAGES/DRESSINGS) ×3 IMPLANT
ELECT CAUTERY BLADE TIP 2.5 (TIP) ×3
ELECT REM PT RETURN 9FT ADLT (ELECTROSURGICAL) ×3
ELECTRODE CAUTERY BLDE TIP 2.5 (TIP) ×1 IMPLANT
ELECTRODE REM PT RTRN 9FT ADLT (ELECTROSURGICAL) ×1 IMPLANT
GAUZE FLUFF 18X24 1PLY STRL (GAUZE/BANDAGES/DRESSINGS) ×3 IMPLANT
GAUZE SPONGE 4X4 12PLY STRL (GAUZE/BANDAGES/DRESSINGS) ×3 IMPLANT
GLOVE BIO SURGEON STRL SZ7.5 (GLOVE) ×3 IMPLANT
GLOVE INDICATOR 8.0 STRL GRN (GLOVE) ×3 IMPLANT
GOWN STRL REUS W/ TWL LRG LVL3 (GOWN DISPOSABLE) ×2 IMPLANT
GOWN STRL REUS W/TWL LRG LVL3 (GOWN DISPOSABLE) ×4
KIT RM TURNOVER STRD PROC AR (KITS) ×3 IMPLANT
LABEL OR SOLS (LABEL) ×3 IMPLANT
PACK BASIN MINOR ARMC (MISCELLANEOUS) ×3 IMPLANT
PIN SAFETY STRL (MISCELLANEOUS) ×3 IMPLANT
SHEARS FOC LG CVD HARMONIC 17C (MISCELLANEOUS) IMPLANT
SLEVE PROBE SENORX GAMMA FIND (MISCELLANEOUS) ×3 IMPLANT
SPONGE LAP 18X18 5 PK (GAUZE/BANDAGES/DRESSINGS) ×3 IMPLANT
STRIP CLOSURE SKIN 1/2X4 (GAUZE/BANDAGES/DRESSINGS) ×4 IMPLANT
SUT ETHILON 3-0 FS-10 30 BLK (SUTURE) ×3
SUT SILK 0 (SUTURE) ×2
SUT SILK 0 30XBRD TIE 6 (SUTURE) ×1 IMPLANT
SUT VIC AB 2-0 CT1 27 (SUTURE) ×6
SUT VIC AB 2-0 CT1 TAPERPNT 27 (SUTURE) ×3 IMPLANT
SUT VIC AB 3-0 SH 27 (SUTURE) ×2
SUT VIC AB 3-0 SH 27X BRD (SUTURE) ×1 IMPLANT
SUT VICRYL+ 3-0 144IN (SUTURE) ×3 IMPLANT
SUTURE EHLN 3-0 FS-10 30 BLK (SUTURE) ×1 IMPLANT
SWABSTK COMLB BENZOIN TINCTURE (MISCELLANEOUS) ×3 IMPLANT
TAPE TRANSPORE STRL 2 31045 (GAUZE/BANDAGES/DRESSINGS) IMPLANT

## 2017-09-28 NOTE — Op Note (Signed)
Preoperative diagnosis: Right breast cancer with persistent positive margin.  Postoperative diagnosis: Same.  Operative procedure: Right mastectomy.  Operating Surgeon: Hervey Ard, MD.  Anesthesia: General by LMA.  Estimated blood loss: 50 cc.  Clinical note: This 51 year old woman is undergoing neoadjuvant chemotherapy and then planned partial mastectomy.  She was found to have a positive margin and was returned to the OR at which time a significant additional volume of tissue was removed and the margin was still found to be positive on permanent section.  She has been recommended to and now is amenable to mastectomy.  She previously undergone a complete axillary dissection.  Operative note: The patient received Kefzol prior to the procedure.  SCD stockings for DVT prevention.  After the induction of general anesthesia the breast chest and axilla was cleansed with ChloraPrep and draped.  An elliptical incision was outlined.  Skin was incised sharply and the remaining dissection completed with the photon blade.  Margins of the mastectomy were the right clavicle superiorly, sternum medially, rectus fascia inferiorly and serratus muscle lateral.  The breast was excised with the underlying pectoralis muscle fascia.  Hemostasis was with electrocautery and 3-0 Vicryl ties and a 3-0 Vicryl suture ligature for 1 of the intercostal vessels.  The breast was sent fresh to pathology per pathology.  The wound was irrigated and good hemostasis noted.  A single Blake drain was brought out through the lower medial flap and anchored into position with a 3-0 nylon suture.  The flaps were approximated with a running 2-0 Vicryl deep dermal suture in 2 segments.  Benzoin and Steri-Strips were applied.  Fluff gauze, Kerlix and an Ace wrap was then applied.  The Blake drain was placed to self suction.  The patient was taken to recovery room in stable condition.

## 2017-09-28 NOTE — H&P (Signed)
Chelsea Hudson [id number] June 25, 1966     HPI: Patient with positive margins x 2 for attempted breast conservation. Now amenable to proceeding to mastectomy.     Medications Prior to Admission  Medication Sig Dispense Refill Last Dose  . diphenhydrAMINE (BENADRYL) 25 MG tablet Take 1 tablet (25 mg total) by mouth at bedtime as needed for sleep. 30 tablet 1 09/27/2017 at pm  . gabapentin (NEURONTIN) 100 MG capsule Take 1 capsule (100 mg total) by mouth 3 (three) times daily. OR 3 tablets once daily at bedtime. (Patient taking differently: Take 300 mg by mouth at bedtime. ) 90 capsule 3 09/27/2017 at pm  . lidocaine-prilocaine (EMLA) cream Apply to affected area once (Patient taking differently: Apply 1 application topically as needed (befor Dr appointments). Apply to affected area once) 30 g 3 "it was last month"  . triamterene-hydrochlorothiazide (MAXZIDE-25) 37.5-25 MG tablet Take 1 tablet by mouth daily. (Patient taking differently: Take 1 tablet by mouth at bedtime. ) 90 tablet 1 09/27/2017 at pm  . Elastic Bandages & Supports (TRUFORM ARM SLEEVE L 20-30MMHG) MISC 1 Device by Does not apply route daily. (Patient not taking: Reported on 09/27/2017) 1 each 0 Not Taking at Unknown time  . HYDROcodone-acetaminophen (NORCO) 5-325 MG tablet Take 1-2 tablets by mouth every 4 (four) hours as needed for moderate pain. Take no more than 10 tablets/ day. (Patient not taking: Reported on 09/17/2017) 30 tablet 0 Completed Course at Unknown time   Allergies  Allergen Reactions  . Other Hives and Itching    [name]    Past Medical History:  Diagnosis Date  . Arthritis    SHOULDER  . Cancer ([place]) 02/28/2017   INVASIVE MAMMARY CARCINOMA WITH MUCINOUS FEATURES.  . Colonic diverticular abscess 06/21/2017   Colonoscopy 06/25/2017: No evidence of malignancy.  . Hypertension   . Irregular heart beat    PT  STATES IT "SKIPS A BEAT"   . Obesity   . Personal history of chemotherapy    Past Surgical History:  Procedure Laterality Date  . ANKLE SURGERY    . BREAST BIOPSY Right 02/28/2017   INVASIVE MAMMARY CARCINOMA WITH MUCINOUS FEATURES.  Marland Kitchen BREAST CYST ASPIRATION Right    NEG   Social History   Socioeconomic History  . Marital status: Married    Spouse name: Not on file  . Number of children: Not on file  . Years of education: Not on file  . Highest education level: Not on file  Social Needs  . Financial resource strain: Not on file  . Food insecurity - worry: Not on file  . Food insecurity - inability: Not on file  . Transportation needs - medical: Not on file  . Transportation needs - non-medical: Not on file  Occupational History  . Not on file  Tobacco Use  . Smoking status: Current Every Day Smoker    Packs/day: 0.25    Years: 25.00    Pack years: 6.25    Types: Cigarettes  . Smokeless tobacco: Never Used  Substance and Sexual Activity  . Alcohol use: Yes    Comment: occas  . Drug use: No  . Sexual activity: Yes  Other Topics Concern  . Not on file  Social History Narrative  . Not on file   Social History   Social History Narrative  . Not on file     ROS: Negative.  PE: HEENT: Negative. Lungs: Clear. Cardio: RR.  Assessment/Plan:  Proceed with planned right mastectomy.  Robert Bellow 09/28/2017

## 2017-09-28 NOTE — Anesthesia Preprocedure Evaluation (Signed)
Anesthesia Evaluation  Patient identified by MRN, date of birth, ID band Patient awake    Reviewed: Allergy & Precautions, NPO status , Patient's Chart, lab work & pertinent test results  History of Anesthesia Complications Negative for: history of anesthetic complications  Airway Mallampati: II  TM Distance: >3 FB Neck ROM: Full    Dental no notable dental hx.    Pulmonary neg sleep apnea, neg COPD, Current Smoker,    breath sounds clear to auscultation- rhonchi (-) wheezing      Cardiovascular hypertension, Pt. on medications (-) CAD, (-) Past MI and (-) Cardiac Stents  Rhythm:Regular Rate:Normal - Systolic murmurs and - Diastolic murmurs    Neuro/Psych negative neurological ROS  negative psych ROS   GI/Hepatic negative GI ROS, Neg liver ROS,   Endo/Other  negative endocrine ROSneg diabetes  Renal/GU negative Renal ROS     Musculoskeletal  (+) Arthritis ,   Abdominal (+) + obese,   Peds  Hematology negative hematology ROS (+)   Anesthesia Other Findings Past Medical History: No date: Arthritis     Comment:  SHOULDER 02/28/2017: Cancer ([place])     Comment:  INVASIVE MAMMARY CARCINOMA WITH MUCINOUS FEATURES. 06/21/2017: Colonic diverticular abscess     Comment:  Colonoscopy 06/25/2017: No evidence of malignancy. No date: Hypertension No date: Irregular heart beat     Comment:  PT STATES IT "SKIPS A BEAT"  No date: Obesity No date: Personal history of chemotherapy   Reproductive/Obstetrics                             Anesthesia Physical Anesthesia Plan  ASA: III  Anesthesia Plan: General   Post-op Pain Management:    Induction: Intravenous  PONV Risk Score and Plan: 1 and Dexamethasone and Ondansetron  Airway Management Planned: LMA  Additional Equipment:   Intra-op Plan:   Post-operative Plan:   Informed Consent: I have reviewed the patients History and Physical,  chart, labs and discussed the procedure including the risks, benefits and alternatives for the proposed anesthesia with the patient or authorized representative who has indicated his/her understanding and acceptance.   Dental advisory given  Plan Discussed with: CRNA and Anesthesiologist  Anesthesia Plan Comments:         Anesthesia Quick Evaluation

## 2017-09-28 NOTE — OR Nursing (Signed)
IV attempts x 2 left arm, port accessed by Phoebe Sharps, RN, Dr's Byrnett and [place] aware of same.

## 2017-09-28 NOTE — Transfer of Care (Signed)
Immediate Anesthesia Transfer of Care Note  Patient: Chelsea Hudson  Procedure(s) Performed: SIMPLE MASTECTOMY (Right Breast)  Patient Location: PACU  Anesthesia Type:General  Level of Consciousness: sedated  Airway & Oxygen Therapy: Patient Spontanous Breathing and Patient connected to face mask oxygen  Post-op Assessment: Report given to RN and Post -op Vital signs reviewed and stable  Post vital signs: Reviewed and stable  Last Vitals:  Vitals:   09/28/17 1132 09/28/17 1425  BP: 139/79 114/86  Pulse: 83 84  Resp: 18 16  Temp: (!) 36.1 C 36.6 C  SpO2: 100% 100%    Last Pain:  Vitals:   09/28/17 1132  TempSrc: Tympanic         Complications: No apparent anesthesia complications

## 2017-09-28 NOTE — Anesthesia Procedure Notes (Signed)
Procedure Name: LMA Insertion Date/Time: 09/28/2017 12:43 PM Performed by: Doreen Salvage, CRNA Pre-anesthesia Checklist: Patient identified, Patient being monitored, Timeout performed, Emergency Drugs available and Suction available Patient Re-evaluated:Patient Re-evaluated prior to induction Oxygen Delivery Method: Circle system utilized Preoxygenation: Pre-oxygenation with 100% oxygen Induction Type: IV induction Ventilation: Mask ventilation without difficulty LMA: LMA inserted LMA Size: 4.5 Tube type: Oral Number of attempts: 1 Placement Confirmation: positive ETCO2 and breath sounds checked- equal and bilateral Tube secured with: Tape Dental Injury: Teeth and Oropharynx as per pre-operative assessment

## 2017-09-28 NOTE — Anesthesia Postprocedure Evaluation (Signed)
Anesthesia Post Note  Patient: Chelsea Hudson  Procedure(s) Performed: SIMPLE MASTECTOMY (Right Breast)  Patient location during evaluation: PACU Anesthesia Type: General Level of consciousness: awake and alert and oriented Pain management: pain level controlled Vital Signs Assessment: post-procedure vital signs reviewed and stable Respiratory status: spontaneous breathing, nonlabored ventilation and respiratory function stable Cardiovascular status: blood pressure returned to baseline and stable Postop Assessment: no signs of nausea or vomiting Anesthetic complications: no     Last Vitals:  Vitals:   09/28/17 1539 09/28/17 1551  BP:  122/63  Pulse: 69 70  Resp: 13 18  Temp: 36.8 C (!) 36.1 C  SpO2: 98% 98%    Last Pain:  Vitals:   09/28/17 1551  TempSrc: Temporal  PainSc: 7                  Breonna Gafford

## 2017-09-28 NOTE — Anesthesia Post-op Follow-up Note (Signed)
Anesthesia QCDR form completed.        

## 2017-09-28 NOTE — Discharge Instructions (Signed)

## 2017-09-29 ENCOUNTER — Encounter: Payer: Self-pay | Admitting: General Surgery

## 2017-10-01 ENCOUNTER — Ambulatory Visit (INDEPENDENT_AMBULATORY_CARE_PROVIDER_SITE_OTHER): Payer: Medicaid Other | Admitting: *Deleted

## 2017-10-01 DIAGNOSIS — Z170 Estrogen receptor positive status [ER+]: Secondary | ICD-10-CM

## 2017-10-01 DIAGNOSIS — C50411 Malignant neoplasm of upper-outer quadrant of right female breast: Secondary | ICD-10-CM

## 2017-10-01 LAB — SURGICAL PATHOLOGY

## 2017-10-01 NOTE — Progress Notes (Signed)
Patient came in today for a wound check right mastectomy .  The wound is clean, with no signs of infection noted. Follow up on Friday . Return for doctor visit on Tuesday.

## 2017-10-01 NOTE — Patient Instructions (Signed)
Follow up on Friday . Return for doctor visit on Tuesday.

## 2017-10-02 ENCOUNTER — Telehealth: Payer: Self-pay

## 2017-10-02 NOTE — Telephone Encounter (Signed)
-----   Message from Robert Bellow, MD sent at 10/01/2017  8:28 PM EST ----- Please notify the patient that the final pathology shows we got it all. Thank you

## 2017-10-02 NOTE — Telephone Encounter (Signed)
Notified patient as instructed, patient pleased. Discussed follow-up appointments, patient agrees  

## 2017-10-03 ENCOUNTER — Ambulatory Visit: Payer: Medicaid Other | Admitting: General Surgery

## 2017-10-05 ENCOUNTER — Ambulatory Visit (INDEPENDENT_AMBULATORY_CARE_PROVIDER_SITE_OTHER): Payer: Medicaid Other | Admitting: *Deleted

## 2017-10-05 DIAGNOSIS — Z170 Estrogen receptor positive status [ER+]: Secondary | ICD-10-CM

## 2017-10-05 DIAGNOSIS — C50411 Malignant neoplasm of upper-outer quadrant of right female breast: Secondary | ICD-10-CM

## 2017-10-05 NOTE — Patient Instructions (Signed)
Return as scheduled 

## 2017-10-05 NOTE — Progress Notes (Signed)
Patient ID: Chelsea Hudson, female   DOB: [birth year], 51 y.o.   MRN: [id number]  Chief Complaint  Patient presents with  . Wound Check    HPI Chelsea Hudson is a 51 y.o. female here today for a recheck from her right mastectomy done on 09/28/2017. Marland KitchenHPI  Past Medical History:  Diagnosis Date  . Arthritis    SHOULDER  . Cancer ([place]) 02/28/2017   INVASIVE MAMMARY CARCINOMA WITH MUCINOUS FEATURES.  . Colonic diverticular abscess 06/21/2017   Colonoscopy 06/25/2017: No evidence of malignancy.  . Hypertension   . Irregular heart beat    PT STATES IT "SKIPS A BEAT"   . Obesity   . Personal history of chemotherapy     Past Surgical History:  Procedure Laterality Date  . ANKLE SURGERY    . BREAST BIOPSY Right 02/28/2017   INVASIVE MAMMARY CARCINOMA WITH MUCINOUS FEATURES.  Marland Kitchen BREAST CYST ASPIRATION Right    NEG  . COLONOSCOPY WITH PROPOFOL N/A 06/25/2017   Performed by Jonathon Bellows, MD at [place]  . INSERTION PORT-A-CATH Left 03/15/2017   Performed by Robert Bellow, MD at [facility] ORS  . MASTECTOMY PARTIAL Right 07/30/2017   Performed by Robert Bellow, MD at [facility] ORS  . RE-EXCISION OF BREAST LUMPECTOMY Right 08/17/2017   Performed by Robert Bellow, MD at [facility] ORS  . SENTINEL NODE BIOPSY Right 07/30/2017   Performed by Robert Bellow, MD at [facility] ORS  . SIMPLE MASTECTOMY Right 09/28/2017   Performed by Robert Bellow, MD at [facility] ORS    Family History  Problem Relation Age of Onset  . Diabetes Father   . Stroke Father   . Breast cancer Mother 15  . Brain cancer Maternal Aunt 60  . Colon cancer Neg Hx     Social History Social History   Tobacco Use  . Smoking status: Current Every Day Smoker    Packs/day: 0.25    Years: 25.00    Pack years: 6.25    Types: Cigarettes  . Smokeless tobacco: Never Used  Substance Use Topics  . Alcohol use: Yes    Comment: occas  . Drug use: No    Allergies  Allergen Reactions  . Other Hives and Itching   [name]     Current Outpatient Medications  Medication Sig Dispense Refill  . diphenhydrAMINE (BENADRYL) 25 MG tablet Take 1 tablet (25 mg total) by mouth at bedtime as needed for sleep. 30 tablet 1  . Elastic Bandages & Supports (TRUFORM ARM SLEEVE L 20-30MMHG) MISC 1 Device by Does not apply route daily. (Patient not taking: Reported on 09/27/2017) 1 each 0  . gabapentin (NEURONTIN) 100 MG capsule Take 1 capsule (100 mg total) by mouth 3 (three) times daily. OR 3 tablets once daily at bedtime. (Patient taking differently: Take 300 mg by mouth at bedtime. ) 90 capsule 3  . HYDROcodone-acetaminophen (NORCO) 5-325 MG tablet Take 1-2 tablets every 4 (four) hours as needed by mouth. Take no more than 10 tablets daily. 30 tablet 0  . lidocaine-prilocaine (EMLA) cream Apply to affected area once (Patient taking differently: Apply 1 application topically as needed (befor Dr appointments). Apply to affected area once) 30 g 3  . triamterene-hydrochlorothiazide (MAXZIDE-25) 37.5-25 MG tablet Take 1 tablet by mouth daily. (Patient taking differently: Take 1 tablet by mouth at bedtime. ) 90 tablet 1  No current facility-administered medications for this visit.     Review of Systems Review of Systems  Constitutional: Negative.   Respiratory: Negative.   Cardiovascular: Negative.     Last menstrual period 01/31/2014.  Physical Exam Physical Exam  Data Reviewed None  Assessment    Patient came in today for a wound check.  The wound is clean, with no signs of infection noted. Rewrapped patient.   Plan    Return as schedueld       Gaspar Cola 10/05/2017, 10:19 AM

## 2017-10-08 ENCOUNTER — Other Ambulatory Visit: Payer: Self-pay

## 2017-10-08 ENCOUNTER — Telehealth: Payer: Self-pay | Admitting: Nurse Practitioner

## 2017-10-08 DIAGNOSIS — I10 Essential (primary) hypertension: Secondary | ICD-10-CM

## 2017-10-08 MED ORDER — TRIAMTERENE-HCTZ 37.5-25 MG PO TABS: 1.0000 | ORAL_TABLET | Freq: Every day | ORAL | 1 refills | Status: DC

## 2017-10-08 NOTE — Telephone Encounter (Signed)
Pt needs a refill on triamterene hydrochlorothiazide sent to [facility].  Her call back number is [phone]

## 2017-10-09 ENCOUNTER — Encounter: Payer: Self-pay | Admitting: General Surgery

## 2017-10-09 ENCOUNTER — Ambulatory Visit (INDEPENDENT_AMBULATORY_CARE_PROVIDER_SITE_OTHER): Payer: Medicaid Other | Admitting: General Surgery

## 2017-10-09 VITALS — BP 142/70 | HR 76 | Resp 14 | Ht 67.0 in | Wt 253.0 lb

## 2017-10-09 DIAGNOSIS — Z170 Estrogen receptor positive status [ER+]: Secondary | ICD-10-CM

## 2017-10-09 DIAGNOSIS — C50411 Malignant neoplasm of upper-outer quadrant of right female breast: Secondary | ICD-10-CM

## 2017-10-09 NOTE — Patient Instructions (Addendum)
Patient to return in one week. 

## 2017-10-09 NOTE — Progress Notes (Signed)
Patient ID: Chelsea Hudson, female   DOB: 03/26/1966, 51 y.o.   MRN: [id number]  Chief Complaint  Patient presents with  . Routine Post Op    HPI Chelsea Hudson is a 51 y.o. female.  Here for postoperative visit, right mastectomy on 09-28-17. Drain sheet present. Drainage volume still running about 40-60 cc/day.  Serosanguineous.  No worsening of mild upper extremity edema noted prior to surgery.  HPI  Past Medical History:  Diagnosis Date  . Arthritis    SHOULDER  . Cancer ([place]) 02/28/2017   INVASIVE MAMMARY CARCINOMA WITH MUCINOUS FEATURES.  . Colonic diverticular abscess 06/21/2017   Colonoscopy 06/25/2017: No evidence of malignancy.  . Hypertension   . Irregular heart beat    PT STATES IT "SKIPS A BEAT"   . Obesity   . Personal history of chemotherapy     Past Surgical History:  Procedure Laterality Date  . ANKLE SURGERY    . BREAST BIOPSY Right 02/28/2017   INVASIVE MAMMARY CARCINOMA WITH MUCINOUS FEATURES.  Marland Kitchen BREAST CYST ASPIRATION Right    NEG  . COLONOSCOPY WITH PROPOFOL N/A 06/25/2017   Procedure: COLONOSCOPY WITH PROPOFOL;  Surgeon: Jonathon Bellows, MD;  Location: [facility] ENDOSCOPY;  Service: Gastroenterology;  Laterality: N/A;  . MASTECTOMY, PARTIAL Right 07/30/2017   Procedure: MASTECTOMY PARTIAL;  Surgeon: Robert Bellow, MD;  Location: [facility] ORS;  Service: General;  Laterality: Right;  . PORTACATH PLACEMENT Left 03/15/2017   Procedure: INSERTION PORT-A-CATH;  Surgeon: Robert Bellow, MD;  Location: [facility] ORS;  Service: General;  Laterality: Left;  . RE-EXCISION OF BREAST LUMPECTOMY Right 08/17/2017    INVASIVE CARCINOMA EXTENDS TO NEW LATERAL MARGIN. /RE-EXCISION OF BREAST LUMPECTOMY;: Skylie Hiott, Forest Gleason, MD;  [facility] ORS; General;  Laterality: Right;  . SENTINEL NODE BIOPSY Right 07/30/2017   Procedure: SENTINEL NODE BIOPSY;  Surgeon: Robert Bellow, MD;  Location: [facility] ORS;  Service: General;  Laterality: Right;  . SIMPLE MASTECTOMY WITH AXILLARY SENTINEL NODE  BIOPSY Right 09/28/2017   Procedure: SIMPLE MASTECTOMY;  Surgeon: Robert Bellow, MD;  Location: [facility] ORS;  Service: General;  Laterality: Right;    Family History  Problem Relation Age of Onset  . Diabetes Father   . Stroke Father   . Breast cancer Mother 66  . Brain cancer Maternal Aunt 60  . Colon cancer Neg Hx     Social History Social History   Tobacco Use  . Smoking status: Current Every Day Smoker    Packs/day: 0.25    Years: 25.00    Pack years: 6.25    Types: Cigarettes  . Smokeless tobacco: Never Used  Substance Use Topics  . Alcohol use: Yes    Comment: occas  . Drug use: No    Allergies  Allergen Reactions  . Other Hives and Itching    [name]     Current Outpatient Medications  Medication Sig Dispense Refill  . diphenhydrAMINE (BENADRYL) 25 MG tablet Take 1 tablet (25 mg total) by mouth at bedtime as needed for sleep. 30 tablet 1  . Elastic Bandages & Supports (TRUFORM ARM SLEEVE L 20-30MMHG) MISC 1 Device by Does not apply route daily. (Patient not taking: Reported on 09/27/2017) 1 each 0  . gabapentin (NEURONTIN) 100 MG capsule Take 1 capsule (100 mg total) by mouth 3 (three) times daily. OR 3 tablets once daily at bedtime. (Patient taking differently: Take  300 mg by mouth at bedtime. ) 90 capsule 3  . HYDROcodone-acetaminophen (NORCO) 5-325 MG tablet Take 1-2 tablets every 4 (four) hours as needed by mouth. Take no more than 10 tablets daily. 30 tablet 0  . lidocaine-prilocaine (EMLA) cream Apply to affected area once (Patient taking differently: Apply 1 application topically as needed (befor Dr appointments). Apply to affected area once) 30 g 3  . triamterene-hydrochlorothiazide (MAXZIDE-25) 37.5-25 MG tablet Take 1 tablet daily by mouth. 90 tablet 1   No current facility-administered medications for this visit.     Review of Systems Review of  Systems  Constitutional: Negative.   Respiratory: Negative.   Cardiovascular: Negative.     Blood pressure (!) 142/70, pulse 76, resp. rate 14, height 5\' 7"  (1.702 m), weight 253 lb (114.8 kg), last menstrual period 01/31/2014.  Physical Exam Physical Exam  Constitutional: She is oriented to person, place, and time. She appears well-developed and well-nourished.  Pulmonary/Chest:    Neurological: She is alert and oriented to person, place, and time.  Skin: Skin is warm and dry.    Data Reviewed Total diameter of the malignancy assessed from her 3 surgeries, where the lateral margin was the positive margin at each event (until mastectomy) is greater than 5 cm which would make her a yT3N3a rather than yT1c,N3a as noted on the pathology report.   Assessment    Doing well post mastectomy.    Plan    I spoke with the patient's mother about what activities are allowed to maintain shoulder range of motion.  I have spoken with pathology about the likely diameter of this tumor.  She may well be a candidate for postmastectomy radiation of both the chest wall and the axilla.    Patient to return in one week. The patient is aware to call back for any questions or concerns.   HPI, Physical Exam, Assessment and Plan have been scribed under the direction and in the presence of Hervey Ard, MD.  Gaspar Cola, CMA  I have completed the exam and reviewed the above documentation for accuracy and completeness.  I agree with the above.  Haematologist has been used and any errors in dictation or transcription are unintentional.  Hervey Ard, M.D., F.A.C.S.  Robert Bellow 10/10/2017, 7:28 AM

## 2017-10-15 ENCOUNTER — Encounter: Payer: Self-pay | Admitting: General Surgery

## 2017-10-15 ENCOUNTER — Ambulatory Visit (INDEPENDENT_AMBULATORY_CARE_PROVIDER_SITE_OTHER): Payer: Medicaid Other | Admitting: General Surgery

## 2017-10-15 VITALS — BP 140/72 | HR 76 | Resp 14 | Ht 64.0 in | Wt 229.0 lb

## 2017-10-15 DIAGNOSIS — C50411 Malignant neoplasm of upper-outer quadrant of right female breast: Secondary | ICD-10-CM

## 2017-10-15 DIAGNOSIS — Z170 Estrogen receptor positive status [ER+]: Secondary | ICD-10-CM

## 2017-10-15 NOTE — Progress Notes (Signed)
Patient ID: Chelsea Hudson, female   DOB: September 16, 1966, 51 y.o.   MRN: [id number]  Chief Complaint  Patient presents with  . Routine Post Op    HPI Chelsea Hudson is a 51 y.o. female here today for her follow up right mastectomy done on 11/9/20187. Drain sheet present. She reports that she is doing well.   HPI  Past Medical History:  Diagnosis Date  . Arthritis    SHOULDER  . Cancer ([place]) 02/28/2017   INVASIVE MAMMARY CARCINOMA WITH MUCINOUS FEATURES.  . Colonic diverticular abscess 06/21/2017   Colonoscopy 06/25/2017: No evidence of malignancy.  . Hypertension   . Irregular heart beat    PT STATES IT "SKIPS A BEAT"   . Obesity   . Personal history of chemotherapy     Past Surgical History:  Procedure Laterality Date  . ANKLE SURGERY    . BREAST BIOPSY Right 02/28/2017   INVASIVE MAMMARY CARCINOMA WITH MUCINOUS FEATURES.  Marland Kitchen BREAST CYST ASPIRATION Right    NEG  . COLONOSCOPY WITH PROPOFOL N/A 06/25/2017   Procedure: COLONOSCOPY WITH PROPOFOL;  Surgeon: Jonathon Bellows, MD;  Location: [facility] ENDOSCOPY;  Service: Gastroenterology;  Laterality: N/A;  . MASTECTOMY, PARTIAL Right 07/30/2017   Procedure: MASTECTOMY PARTIAL;  Surgeon: Robert Bellow, MD;  Location: [facility] ORS;  Service: General;  Laterality: Right;  . PORTACATH PLACEMENT Left 03/15/2017   Procedure: INSERTION PORT-A-CATH;  Surgeon: Robert Bellow, MD;  Location: [facility] ORS;  Service: General;  Laterality: Left;  . RE-EXCISION OF BREAST LUMPECTOMY Right 08/17/2017    INVASIVE CARCINOMA EXTENDS TO NEW LATERAL MARGIN. /RE-EXCISION OF BREAST LUMPECTOMY;: Quinita Kostelecky, Forest Gleason, MD;  [facility] ORS; General;  Laterality: Right;  . SENTINEL NODE BIOPSY Right 07/30/2017   Procedure: SENTINEL NODE BIOPSY;  Surgeon: Robert Bellow, MD;  Location: [facility] ORS;  Service: General;  Laterality: Right;  . SIMPLE MASTECTOMY WITH AXILLARY SENTINEL NODE BIOPSY Right 09/28/2017   Procedure: SIMPLE MASTECTOMY;  Surgeon: Robert Bellow, MD;   Location: [facility] ORS;  Service: General;  Laterality: Right;    Family History  Problem Relation Age of Onset  . Diabetes Father   . Stroke Father   . Breast cancer Mother 66  . Brain cancer Maternal Aunt 60  . Colon cancer Neg Hx     Social History Social History   Tobacco Use  . Smoking status: Current Every Day Smoker    Packs/day: 0.25    Years: 25.00    Pack years: 6.25    Types: Cigarettes  . Smokeless tobacco: Never Used  Substance Use Topics  . Alcohol use: Yes    Comment: occas  . Drug use: No    Allergies  Allergen Reactions  . Other Hives and Itching    [name]     Current Outpatient Medications  Medication Sig Dispense Refill  . diphenhydrAMINE (BENADRYL) 25 MG tablet Take 1 tablet (25 mg total) by mouth at bedtime as needed for sleep. 30 tablet 1  . Elastic Bandages & Supports (TRUFORM ARM SLEEVE L 20-30MMHG) MISC 1 Device by Does not apply route daily. 1 each 0  . gabapentin (NEURONTIN) 100 MG capsule Take 1 capsule (100 mg total) by mouth 3 (three) times daily. OR 3 tablets once daily at bedtime. (Patient taking differently: Take 300 mg by mouth at bedtime. ) 90 capsule 3  . HYDROcodone-acetaminophen (NORCO) 5-325 MG tablet  Take 1-2 tablets every 4 (four) hours as needed by mouth. Take no more than 10 tablets daily. 30 tablet 0  . lidocaine-prilocaine (EMLA) cream Apply to affected area once (Patient taking differently: Apply 1 application topically as needed (befor Dr appointments). Apply to affected area once) 30 g 3  . triamterene-hydrochlorothiazide (MAXZIDE-25) 37.5-25 MG tablet Take 1 tablet daily by mouth. 90 tablet 1   No current facility-administered medications for this visit.     Review of Systems Review of Systems  Constitutional: Negative.   Respiratory: Negative.   Cardiovascular: Negative.     Blood pressure 140/72, pulse 76, resp. rate  14, height 5\' 4"  (1.626 m), weight 229 lb (103.9 kg), last menstrual period 01/31/2014.  Physical Exam Physical Exam  Pulmonary/Chest:      Data Reviewed Drain volumes still running about 60 mL per day.  Review of the 3 pathology specimen shows that the total diameter of the tumor is 5.1 cm. This would make her a pT3,N2a rather than a pT1c, N2a.  Case reviewed at [facility] breast conference this state.  Assessment    Doing well status post salvage mastectomy.    Plan  The patient may well be a candidate for chest wall and axillary radiation based on her tumor volume.    Patient to use Neosporin to drain site daily after showering. To return in one week.        Robert Bellow 10/15/2017, 9:14 PM

## 2017-10-15 NOTE — Patient Instructions (Addendum)
Use neosporin to drain area after showering daily.  Follow up in one week.

## 2017-10-16 NOTE — Progress Notes (Signed)
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  INTERVAL HISTORY: Patient returns to clinic today for further evaluation and post surgical follow-up.  After 3 surgeries resulting in a full mastectomy, was finally found to have negative margins.  She continues to have a drain in her surgical site and has close follow-up with surgery. She currently feels well and is asymptomatic. She denies any peripheral neuropathy or other neurologic complaints. She denies any recent fevers or illnesses. She has a good appetite and denies weight loss. She has no chest pain or shortness of breath. She denies any nausea, vomiting, constipation, or diarrhea. She has no urinary complaints. Patient offers no further specific complaints today.  REVIEW OF SYSTEMS:   Review of Systems  Constitutional: Negative.  Negative for fever, malaise/fatigue and weight loss.  HENT: Negative.  Negative for congestion.   Respiratory: Negative.  Negative for cough and shortness of breath.   Cardiovascular: Negative.  Negative for chest pain and leg swelling.  Gastrointestinal: Negative.  Negative for abdominal pain, nausea and vomiting.  Genitourinary: Negative.   Skin: Negative.  Negative for rash.  Neurological: Negative.  Negative for sensory change and weakness.  Psychiatric/Behavioral: Negative.  The patient is not nervous/anxious.     As per HPI. Otherwise, a complete review of systems is negative.  PAST MEDICAL HISTORY: Past Medical History:  Diagnosis Date  . Arthritis      SHOULDER  . Cancer ([place]) 02/28/2017   INVASIVE MAMMARY CARCINOMA WITH MUCINOUS FEATURES.  . Colonic diverticular abscess 06/21/2017   Colonoscopy 06/25/2017: No evidence of malignancy.  . Hypertension   . Irregular heart beat    PT STATES IT "SKIPS A BEAT"   . Obesity   . Personal history of chemotherapy     PAST SURGICAL HISTORY: Past Surgical History:  Procedure Laterality Date  . ANKLE SURGERY    . BREAST BIOPSY Right 02/28/2017   INVASIVE MAMMARY CARCINOMA WITH MUCINOUS FEATURES.  Marland Kitchen BREAST CYST ASPIRATION Right    NEG  . COLONOSCOPY WITH PROPOFOL N/A 06/25/2017   Procedure: COLONOSCOPY WITH PROPOFOL;  Surgeon: Jonathon Bellows, MD;  Location: [facility] ENDOSCOPY;  Service: Gastroenterology;  Laterality: N/A;  . MASTECTOMY, PARTIAL Right 07/30/2017   Procedure: MASTECTOMY PARTIAL;  Surgeon: Robert Bellow, MD;  Location: [facility] ORS;  Service: General;  Laterality: Right;  . PORTACATH PLACEMENT Left 03/15/2017   Procedure: INSERTION PORT-A-CATH;  Surgeon: Robert Bellow, MD;  Location: [facility] ORS;  Service: General;  Laterality: Left;  . RE-EXCISION OF BREAST LUMPECTOMY Right 08/17/2017    INVASIVE CARCINOMA EXTENDS TO NEW LATERAL MARGIN. /RE-EXCISION OF BREAST LUMPECTOMY;: Byrnett, Forest Gleason, MD;  [facility] ORS; General;  Laterality: Right;  . SENTINEL NODE BIOPSY Right 07/30/2017   Procedure: SENTINEL NODE BIOPSY;  Surgeon: Robert Bellow, MD;  Location: [facility] ORS;  Service: General;  Laterality: Right;  . SIMPLE MASTECTOMY WITH AXILLARY SENTINEL NODE BIOPSY Right 09/28/2017   Procedure: SIMPLE MASTECTOMY;  Surgeon: Robert Bellow, MD;  Location: [facility] ORS;  Service: General;  Laterality: Right;    FAMILY HISTORY: Family History  Problem Relation  Age of Onset  . Diabetes Father   . Stroke Father   . Breast cancer Mother 31  . Brain cancer Maternal Aunt 60  . Colon cancer Neg Hx     ADVANCED DIRECTIVES (Y/N):  N  HEALTH MAINTENANCE: Social History   Tobacco Use  . Smoking  status: Current Every Day Smoker    Packs/day: 0.25    Years: 25.00    Pack years: 6.25    Types: Cigarettes  . Smokeless tobacco: Never Used  Substance Use Topics  . Alcohol use: Yes    Comment: occas  . Drug use: No     Colonoscopy:  PAP:  Bone density:  Lipid panel:  Allergies  Allergen Reactions  . Other Hives and Itching    [name]     Current Outpatient Medications  Medication Sig Dispense Refill  . diphenhydrAMINE (BENADRYL) 25 MG tablet Take 1 tablet (25 mg total) by mouth at bedtime as needed for sleep. 30 tablet 1  . Elastic Bandages & Supports (TRUFORM ARM SLEEVE L 20-30MMHG) MISC 1 Device by Does not apply route daily. 1 each 0  . gabapentin (NEURONTIN) 100 MG capsule Take 1 capsule (100 mg total) by mouth 3 (three) times daily. OR 3 tablets once daily at bedtime. (Patient taking differently: Take 300 mg by mouth at bedtime. ) 90 capsule 3  . HYDROcodone-acetaminophen (NORCO) 5-325 MG tablet Take 1-2 tablets every 4 (four) hours as needed by mouth. Take no more than 10 tablets daily. 30 tablet 0  . lidocaine-prilocaine (EMLA) cream Apply to affected area once (Patient taking differently: Apply 1 application topically as needed (befor Dr appointments). Apply to affected area once) 30 g 3  . triamterene-hydrochlorothiazide (MAXZIDE-25) 37.5-25 MG tablet Take 1 tablet daily by mouth. 90 tablet 1   No current facility-administered medications for this visit.     OBJECTIVE: Vitals:   10/17/17 1225  BP: 138/89  Pulse: 80  Resp: 20  Temp: 97.6 F (36.4 C)     Body mass index is 38.79 kg/m.    ECOG FS:0 - Asymptomatic  General: Well-developed, well-nourished, no acute distress. Eyes: Pink conjunctiva, anicteric sclera. Breasts: Right mastectomy with drain in place. Heart: Regular rate and rhythm. No rubs, murmurs, or gallops. Abdomen: Soft, tender, non-distended.  No organomegaly noted, normoactive bowel sounds. Musculoskeletal: No edema, cyanosis, or clubbing. Neuro: Alert, answering all questions appropriately. Cranial nerves grossly intact. Skin: No rashes or petechiae noted. Psych: Normal affect.  LAB RESULTS:  Lab Results  Component Value Date   NA 137 09/12/2017   K 3.5 09/12/2017   CL 102 09/12/2017   CO2 27 09/12/2017   GLUCOSE 141 (H) 09/12/2017   BUN 12 09/12/2017   CREATININE 0.75 09/12/2017   CALCIUM 9.2 09/12/2017   PROT 7.1 09/12/2017   ALBUMIN 3.4 (L) 09/12/2017   AST 21 09/12/2017   ALT 23 09/12/2017   ALKPHOS 95 09/12/2017   BILITOT 0.4 09/12/2017   GFRNONAA >60 09/12/2017   GFRAA >60 09/12/2017    Lab Results  Component Value Date   WBC 5.4 09/12/2017   NEUTROABS 4.1 09/12/2017   HGB 11.5 (L) 09/12/2017   HCT 35.5 09/12/2017   MCV 82.2 09/12/2017   PLT 328 09/12/2017     STUDIES: No results found.  ASSESSMENT: Pathologic stage T1c N3a M0,  ER/PR positive, HER-2 negative invasive carcinoma of the upper outer quadrant  of the right breast.  PLAN:   1. 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 Treatment was then discontinued secondary to worsening peripheral neuropathy. Patient's initial surgery was on July 30, 2017.  She required multiple excisions to obtain clear margins resulting in a full mastectomy with her final surgery occurring on September 28, 2017.  Given the extent of residual disease, it was agreed upon that despite a full mastectomy, she still should undergo adjuvant XRT. Given the ER/PR status of her tumor she will benefit from either tamoxifen or an aromatase inhibitor for a minimum of 5 years at the conclusion of her XRT.  Return to clinic in 1 month for further evaluation as well as consultation with radiation oncology.   2. Genetic testing:  Negative. 3. Left upper and lower abdominal pain: Resolved. Patient had colonoscopy that did not reveal any malignancy, but did reveal an abscess.    4. Anemia: Stable, monitor. 5. Joint pain: Improved. Patient does not complain of pain today.  6. Peripheral neuropathy: Resolved.  Approximately 30 minutes was spent in discussion of which greater than 50% was consultation.  Patient expressed understanding and was in agreement with this plan. She also understands that She can call clinic at any time with any questions, concerns, or complaints.   Cancer Staging Malignant neoplasm of upper-outer quadrant of right breast in female, estrogen receptor positive ([place]) Staging form: Breast, AJCC 8th Edition - Clinical stage from 03/05/2017: Stage IIA (cT2, cN2a, cM0, G2, ER: Positive, PR: Positive, HER2: Negative) - Signed by Lloyd Huger, MD on 08/10/2017 - Pathologic stage from 08/10/2017: No Stage Recommended (ypT1c, pN3a, cM0, G2, ER: Positive, PR: Positive, HER2: Negative) - Signed by Lloyd Huger, MD on 08/10/2017   Lloyd Huger, MD   10/19/2017 3:55 PM

## 2017-10-17 ENCOUNTER — Inpatient Hospital Stay: Payer: Medicaid Other | Attending: Oncology | Admitting: Oncology

## 2017-10-17 ENCOUNTER — Other Ambulatory Visit: Payer: Self-pay

## 2017-10-17 ENCOUNTER — Encounter: Payer: Self-pay | Admitting: Oncology

## 2017-10-17 VITALS — BP 138/89 | HR 80 | Temp 97.6°F | Resp 20 | Wt 226.0 lb

## 2017-10-17 DIAGNOSIS — I499 Cardiac arrhythmia, unspecified: Secondary | ICD-10-CM | POA: Diagnosis not present

## 2017-10-17 DIAGNOSIS — Z9221 Personal history of antineoplastic chemotherapy: Secondary | ICD-10-CM | POA: Diagnosis not present

## 2017-10-17 DIAGNOSIS — Z79899 Other long term (current) drug therapy: Secondary | ICD-10-CM

## 2017-10-17 DIAGNOSIS — E669 Obesity, unspecified: Secondary | ICD-10-CM

## 2017-10-17 DIAGNOSIS — Z803 Family history of malignant neoplasm of breast: Secondary | ICD-10-CM | POA: Insufficient documentation

## 2017-10-17 DIAGNOSIS — Z808 Family history of malignant neoplasm of other organs or systems: Secondary | ICD-10-CM | POA: Insufficient documentation

## 2017-10-17 DIAGNOSIS — I10 Essential (primary) hypertension: Secondary | ICD-10-CM

## 2017-10-17 DIAGNOSIS — C50411 Malignant neoplasm of upper-outer quadrant of right female breast: Secondary | ICD-10-CM | POA: Diagnosis not present

## 2017-10-17 DIAGNOSIS — Z9013 Acquired absence of bilateral breasts and nipples: Secondary | ICD-10-CM | POA: Insufficient documentation

## 2017-10-17 DIAGNOSIS — M129 Arthropathy, unspecified: Secondary | ICD-10-CM | POA: Diagnosis not present

## 2017-10-17 DIAGNOSIS — F17210 Nicotine dependence, cigarettes, uncomplicated: Secondary | ICD-10-CM | POA: Insufficient documentation

## 2017-10-17 DIAGNOSIS — D649 Anemia, unspecified: Secondary | ICD-10-CM | POA: Diagnosis not present

## 2017-10-17 DIAGNOSIS — M2550 Pain in unspecified joint: Secondary | ICD-10-CM | POA: Diagnosis not present

## 2017-10-17 DIAGNOSIS — Z170 Estrogen receptor positive status [ER+]: Secondary | ICD-10-CM | POA: Insufficient documentation

## 2017-10-17 NOTE — Progress Notes (Signed)
Patient denies any concerns today.  

## 2017-10-23 ENCOUNTER — Ambulatory Visit (INDEPENDENT_AMBULATORY_CARE_PROVIDER_SITE_OTHER): Payer: Medicaid Other | Admitting: General Surgery

## 2017-10-23 VITALS — BP 130/70 | HR 78 | Resp 12 | Ht 66.0 in | Wt 254.0 lb

## 2017-10-23 DIAGNOSIS — Z170 Estrogen receptor positive status [ER+]: Secondary | ICD-10-CM

## 2017-10-23 DIAGNOSIS — C50411 Malignant neoplasm of upper-outer quadrant of right female breast: Secondary | ICD-10-CM

## 2017-10-23 NOTE — Progress Notes (Signed)
Patient ID: Chelsea Hudson, female   DOB: 01-Nov-1966, 51 y.o.   MRN: [id number]  Chief Complaint  Patient presents with  . Routine Post Op    HPI Chelsea Hudson is a 51 y.o. female here today for her post op right mastectomy done on 09/28/2017. Drain sheet present.  HPI  Past Medical History:  Diagnosis Date  . Arthritis    SHOULDER  . Cancer ([place]) 02/28/2017   INVASIVE MAMMARY CARCINOMA WITH MUCINOUS FEATURES.  . Colonic diverticular abscess 06/21/2017   Colonoscopy 06/25/2017: No evidence of malignancy.  . Hypertension   . Irregular heart beat    PT STATES IT "SKIPS A BEAT"   . Obesity   . Personal history of chemotherapy     Past Surgical History:  Procedure Laterality Date  . ANKLE SURGERY    . BREAST BIOPSY Right 02/28/2017   INVASIVE MAMMARY CARCINOMA WITH MUCINOUS FEATURES.  Marland Kitchen BREAST CYST ASPIRATION Right    NEG  . COLONOSCOPY WITH PROPOFOL N/A 06/25/2017   Procedure: COLONOSCOPY WITH PROPOFOL;  Surgeon: Jonathon Bellows, MD;  Location: [facility] ENDOSCOPY;  Service: Gastroenterology;  Laterality: N/A;  . MASTECTOMY, PARTIAL Right 07/30/2017   Procedure: MASTECTOMY PARTIAL;  Surgeon: Robert Bellow, MD;  Location: [facility] ORS;  Service: General;  Laterality: Right;  . PORTACATH PLACEMENT Left 03/15/2017   Procedure: INSERTION PORT-A-CATH;  Surgeon: Robert Bellow, MD;  Location: [facility] ORS;  Service: General;  Laterality: Left;  . RE-EXCISION OF BREAST LUMPECTOMY Right 08/17/2017    INVASIVE CARCINOMA EXTENDS TO NEW LATERAL MARGIN. /RE-EXCISION OF BREAST LUMPECTOMY;: Jaber Dunlow, Forest Gleason, MD;  [facility] ORS; General;  Laterality: Right;  . SENTINEL NODE BIOPSY Right 07/30/2017   Procedure: SENTINEL NODE BIOPSY;  Surgeon: Robert Bellow, MD;  Location: [facility] ORS;  Service: General;  Laterality: Right;  . SIMPLE MASTECTOMY WITH AXILLARY SENTINEL NODE BIOPSY Right 09/28/2017   Procedure: SIMPLE MASTECTOMY;  Surgeon: Robert Bellow, MD;  Location: [facility] ORS;  Service: General;   Laterality: Right;    Family History  Problem Relation Age of Onset  . Diabetes Father   . Stroke Father   . Breast cancer Mother 49  . Brain cancer Maternal Aunt 60  . Colon cancer Neg Hx     Social History Social History   Tobacco Use  . Smoking status: Current Every Day Smoker    Packs/day: 0.25    Years: 25.00    Pack years: 6.25    Types: Cigarettes  . Smokeless tobacco: Never Used  Substance Use Topics  . Alcohol use: Yes    Comment: occas  . Drug use: No    Allergies  Allergen Reactions  . Other Hives and Itching    [name]     Current Outpatient Medications  Medication Sig Dispense Refill  . diphenhydrAMINE (BENADRYL) 25 MG tablet Take 1 tablet (25 mg total) by mouth at bedtime as needed for sleep. 30 tablet 1  . Elastic Bandages & Supports (TRUFORM ARM SLEEVE L 20-30MMHG) MISC 1 Device by Does not apply route daily. 1 each 0  . gabapentin (NEURONTIN) 100 MG capsule Take 1 capsule (100 mg total) by mouth 3 (three) times daily. OR 3 tablets once daily at bedtime. (Patient taking differently: Take 300 mg by mouth at bedtime. ) 90 capsule 3  . HYDROcodone-acetaminophen (NORCO) 5-325 MG tablet Take 1-2 tablets every 4 (four) hours as  needed by mouth. Take no more than 10 tablets daily. 30 tablet 0  . lidocaine-prilocaine (EMLA) cream Apply to affected area once (Patient taking differently: Apply 1 application topically as needed (befor Dr appointments). Apply to affected area once) 30 g 3  . triamterene-hydrochlorothiazide (MAXZIDE-25) 37.5-25 MG tablet Take 1 tablet daily by mouth. 90 tablet 1   No current facility-administered medications for this visit.     Review of Systems Review of Systems  Constitutional: Negative.   Respiratory: Negative.   Cardiovascular: Negative.     Blood pressure 130/70, pulse 78, resp. rate 12, height 5\' 6"  (1.676 m), weight 254  lb (115.2 kg), last menstrual period 01/31/2014.  Physical Exam Physical Exam  Constitutional: She is oriented to person, place, and time. She appears well-developed and well-nourished.  Neurological: She is alert and oriented to person, place, and time.  Skin: Skin is warm and dry.  Drain was removed.   Data Reviewed Review of the patient's multiple breast resection chose T absolute tumor diameter at 5.1 cm. This would make her  pT3,N3a,M0. Plans are at present for chest wall radiation.  Assessment        Doing well status post mastectomy. Excellent shoulon. Plan    The patient was advised she may develop a small fluid collection in the call of symptomatic.    Patient to return in one week. The patient is aware to call back for any questions or concerns.   HPI, Physical Exam, Assessment and Plan have been scribed under the direction and in the presence of Hervey Ard, MD.  Gaspar Cola, CMA  I have completed the exam and reviewed the above documentation for accuracy and completeness.  I agree with the above.  Haematologist has been used and any errors in dictation or transcription are unintentional.  Hervey Ard, M.D., F.A.C.S.  Robert Bellow 10/23/2017, 9:09 PM

## 2017-10-31 ENCOUNTER — Encounter: Payer: Self-pay | Admitting: General Surgery

## 2017-10-31 ENCOUNTER — Ambulatory Visit (INDEPENDENT_AMBULATORY_CARE_PROVIDER_SITE_OTHER): Payer: Medicaid Other | Admitting: General Surgery

## 2017-10-31 VITALS — BP 132/74 | HR 72 | Resp 14 | Ht 66.0 in | Wt 255.0 lb

## 2017-10-31 DIAGNOSIS — C50411 Malignant neoplasm of upper-outer quadrant of right female breast: Secondary | ICD-10-CM

## 2017-10-31 DIAGNOSIS — Z170 Estrogen receptor positive status [ER+]: Secondary | ICD-10-CM

## 2017-10-31 NOTE — Progress Notes (Signed)
Patient ID: Chelsea Hudson, female   DOB: [birth year], 51 y.o.   MRN: [id number]  Chief Complaint  Patient presents with  . Routine Post Op    HPI Chelsea Hudson is a 51 y.o. female here today for her post op right mastectomy done on 09/28/2017. Patient states she is doing well.  HPI  Past Medical History:  Diagnosis Date  . Arthritis    SHOULDER  . Cancer ([place]) 02/28/2017   INVASIVE MAMMARY CARCINOMA WITH MUCINOUS FEATURES.  . Colonic diverticular abscess 06/21/2017   Colonoscopy 06/25/2017: No evidence of malignancy.  . Hypertension   . Irregular heart beat    PT STATES IT "SKIPS A BEAT"   . Obesity   . Personal history of chemotherapy     Past Surgical History:  Procedure Laterality Date  . ANKLE SURGERY    . BREAST BIOPSY Right 02/28/2017   INVASIVE MAMMARY CARCINOMA WITH MUCINOUS FEATURES.  Marland Kitchen BREAST CYST ASPIRATION Right    NEG  . COLONOSCOPY WITH PROPOFOL N/A 06/25/2017   Procedure: COLONOSCOPY WITH PROPOFOL;  Surgeon: Jonathon Bellows, MD;  Location: [facility] ENDOSCOPY;  Service: Gastroenterology;  Laterality: N/A;  . MASTECTOMY, PARTIAL Right 07/30/2017   Procedure: MASTECTOMY PARTIAL;  Surgeon: Robert Bellow, MD;  Location: [facility] ORS;  Service: General;  Laterality: Right;  . PORTACATH PLACEMENT Left 03/15/2017   Procedure: INSERTION PORT-A-CATH;  Surgeon: Robert Bellow, MD;  Location: [facility] ORS;  Service: General;  Laterality: Left;  . RE-EXCISION OF BREAST LUMPECTOMY Right 08/17/2017    INVASIVE CARCINOMA EXTENDS TO NEW LATERAL MARGIN. /RE-EXCISION OF BREAST LUMPECTOMY;: Kaliel Bolds, Forest Gleason, MD;  [facility] ORS; General;  Laterality: Right;  . SENTINEL NODE BIOPSY Right 07/30/2017   Procedure: SENTINEL NODE BIOPSY;  Surgeon: Robert Bellow, MD;  Location: [facility] ORS;  Service: General;  Laterality: Right;  . SIMPLE MASTECTOMY WITH AXILLARY SENTINEL NODE BIOPSY Right 09/28/2017   Procedure: SIMPLE MASTECTOMY;  Surgeon: Robert Bellow, MD;  Location: [facility] ORS;  Service:  General;  Laterality: Right;    Family History  Problem Relation Age of Onset  . Diabetes Father   . Stroke Father   . Breast cancer Mother 52  . Brain cancer Maternal Aunt 60  . Colon cancer Neg Hx     Social History Social History   Tobacco Use  . Smoking status: Current Every Day Smoker    Packs/day: 0.25    Years: 25.00    Pack years: 6.25    Types: Cigarettes  . Smokeless tobacco: Never Used  Substance Use Topics  . Alcohol use: Yes    Comment: occas  . Drug use: No    Allergies  Allergen Reactions  . Other Hives and Itching    [name]     Current Outpatient Medications  Medication Sig Dispense Refill  . diphenhydrAMINE (BENADRYL) 25 MG tablet Take 1 tablet (25 mg total) by mouth at bedtime as needed for sleep. 30 tablet 1  . Elastic Bandages & Supports (TRUFORM ARM SLEEVE L 20-30MMHG) MISC 1 Device by Does not apply route daily. 1 each 0  . gabapentin (NEURONTIN) 100 MG capsule Take 1 capsule (100 mg total) by mouth 3 (three) times daily. OR 3 tablets once daily at bedtime. (Patient taking differently: Take 300 mg by mouth at bedtime. ) 90 capsule 3  . HYDROcodone-acetaminophen (NORCO) 5-325 MG tablet Take 1-2 tablets every 4 (  four) hours as needed by mouth. Take no more than 10 tablets daily. 30 tablet 0  . lidocaine-prilocaine (EMLA) cream Apply to affected area once (Patient taking differently: Apply 1 application topically as needed (befor Dr appointments). Apply to affected area once) 30 g 3  . triamterene-hydrochlorothiazide (MAXZIDE-25) 37.5-25 MG tablet Take 1 tablet daily by mouth. 90 tablet 1   No current facility-administered medications for this visit.     Review of Systems Review of Systems  Constitutional: Negative.   Respiratory: Negative.   Cardiovascular: Negative.     Blood pressure 132/74, pulse 72, resp. rate 14, height 5\' 6"  (1.676 m),  weight 255 lb (115.7 kg), last menstrual period 01/31/2014.  Physical Exam Physical Exam  Constitutional: She is oriented to person, place, and time. She appears well-developed and well-nourished.  Pulmonary/Chest:    Neurological: She is alert and oriented to person, place, and time.  Skin: Skin is warm and dry.   Drained 40 ml of fluid after ChloraPrep skin cleansing. Well tolerated.    Assessment    Minimal fluid collection post drain removal last week.    Plan         Patient to return in one weeks. The patient is aware to call back for any questions or concerns.  HPI, Physical Exam, Assessment and Plan have been scribed under the direction and in the presence of Hervey Ard, MD.  Gaspar Cola, CMA  I have completed the exam and reviewed the above documentation for accuracy and completeness.  I agree with the above.  Haematologist has been used and any errors in dictation or transcription are unintentional.  Hervey Ard, M.D., F.A.C.S.  Robert Bellow 11/01/2017, 9:37 PM

## 2017-10-31 NOTE — Patient Instructions (Signed)
Patient to return in one weeks. The patient is aware to call back for any questions or concerns.

## 2017-11-06 ENCOUNTER — Encounter: Payer: Self-pay | Admitting: General Surgery

## 2017-11-06 ENCOUNTER — Ambulatory Visit (INDEPENDENT_AMBULATORY_CARE_PROVIDER_SITE_OTHER): Payer: Medicaid Other | Admitting: General Surgery

## 2017-11-06 VITALS — BP 118/82 | HR 85 | Resp 12 | Ht 66.0 in | Wt 256.0 lb

## 2017-11-06 DIAGNOSIS — C50411 Malignant neoplasm of upper-outer quadrant of right female breast: Secondary | ICD-10-CM

## 2017-11-06 DIAGNOSIS — Z170 Estrogen receptor positive status [ER+]: Secondary | ICD-10-CM

## 2017-11-06 NOTE — Progress Notes (Signed)
Patient ID: Chelsea Hudson, female   DOB: 08-01-1966, 51 y.o.   MRN: [id number]  Chief Complaint  Patient presents with  . Follow-up    HPI Chelsea Hudson is a 51 y.o. female here today for her follow up right mastectomy done on 09/28/2017. Patient states she is doing well.  HPI  Past Medical History:  Diagnosis Date  . Arthritis    SHOULDER  . Cancer ([place]) 02/28/2017   INVASIVE MAMMARY CARCINOMA WITH MUCINOUS FEATURES.  . Colonic diverticular abscess 06/21/2017   Colonoscopy 06/25/2017: No evidence of malignancy.  . Hypertension   . Irregular heart beat    PT STATES IT "SKIPS A BEAT"   . Obesity   . Personal history of chemotherapy     Past Surgical History:  Procedure Laterality Date  . ANKLE SURGERY    . BREAST BIOPSY Right 02/28/2017   INVASIVE MAMMARY CARCINOMA WITH MUCINOUS FEATURES.  Marland Kitchen BREAST CYST ASPIRATION Right    NEG  . COLONOSCOPY WITH PROPOFOL N/A 06/25/2017   Procedure: COLONOSCOPY WITH PROPOFOL;  Surgeon: Chelsea Bellows, MD;  Location[facility] ENDOSCOPY;  Service: Gastroenterology;  Laterality: N/A;  . MASTECTOMY, PARTIAL Right 07/30/2017   Procedure: MASTECTOMY PARTIAL;  Surgeon: Chelsea Bellow, MD;  Location: [facility] ORS;  Service: General;  Laterality: Right;  . PORTACATH PLACEMENT Left 03/15/2017   Procedure: INSERTION PORT-A-CATH;  Surgeon: Chelsea Bellow, MD;  Location: [facility] ORS;  Service: General;  Laterality: Left;  . RE-EXCISION OF BREAST LUMPECTOMY Right 08/17/2017    INVASIVE CARCINOMA EXTENDS TO NEW LATERAL MARGIN. /RE-EXCISION OF BREAST LUMPECTOMY;: Byrnett, Forest Gleason, MD;  [facility] ORS; General;  Laterality: Right;  . SENTINEL NODE BIOPSY Right 07/30/2017   Procedure: SENTINEL NODE BIOPSY;  Surgeon: Chelsea Bellow, MD;  Location: [facility] ORS;  Service: General;  Laterality: Right;  . SIMPLE MASTECTOMY WITH AXILLARY SENTINEL NODE BIOPSY Right 09/28/2017   Procedure: SIMPLE MASTECTOMY;  Surgeon: Chelsea Bellow, MD;  Location: [facility] ORS;  Service: General;   Laterality: Right;    Family History  Problem Relation Age of Onset  . Diabetes Father   . Stroke Father   . Breast cancer Mother 40  . Brain cancer Maternal Aunt 60  . Colon cancer Neg Hx     Social History Social History   Tobacco Use  . Smoking status: Current Every Day Smoker    Packs/day: 0.25    Years: 25.00    Pack years: 6.25    Types: Cigarettes  . Smokeless tobacco: Never Used  Substance Use Topics  . Alcohol use: Yes    Comment: occas  . Drug use: No    Allergies  Allergen Reactions  . Other Hives and Itching    [name]     Current Outpatient Medications  Medication Sig Dispense Refill  . diphenhydrAMINE (BENADRYL) 25 MG tablet Take 1 tablet (25 mg total) by mouth at bedtime as needed for sleep. 30 tablet 1  . Elastic Bandages & Supports (TRUFORM ARM SLEEVE L 20-30MMHG) MISC 1 Device by Does not apply route daily. 1 each 0  . gabapentin (NEURONTIN) 100 MG capsule Take 1 capsule (100 mg total) by mouth 3 (three) times daily. OR 3 tablets once daily at bedtime. (Patient taking differently: Take 300 mg by mouth at bedtime. ) 90 capsule 3  . HYDROcodone-acetaminophen (NORCO) 5-325 MG tablet Take 1-2 tablets every 4 (four) hours  as needed by mouth. Take no more than 10 tablets daily. 30 tablet 0  . lidocaine-prilocaine (EMLA) cream Apply to affected area once (Patient taking differently: Apply 1 application topically as needed (befor Dr appointments). Apply to affected area once) 30 g 3  . triamterene-hydrochlorothiazide (MAXZIDE-25) 37.5-25 MG tablet Take 1 tablet daily by mouth. 90 tablet 1   No current facility-administered medications for this visit.     Review of Systems Review of Systems  Constitutional: Negative.   Respiratory: Negative.   Cardiovascular: Negative.     Blood pressure 118/82, pulse 85, resp. rate 12, height 5\' 6"  (1.676 m), weight 256  lb (116.1 kg), last menstrual period 01/31/2014.  Physical Exam Physical Exam  Constitutional: She is oriented to person, place, and time. She appears well-developed and well-nourished.  Neurological: She is alert and oriented to person, place, and time.  Skin: Skin is warm and dry.  Drained 15 ml of fluid.   Data Reviewed  Review of the 3 pathology report shows the patient's absolute tumor diameter at 5.1 cm. This would make her a pT3,N3a,M0  Assessment    Doing well post mastectomy.  Excellent shoulder range of motion.    Plan    The patient will be meeting with medical oncology in early January. We'll look forward to their recommendations regarding chest wall radiation in light of the tumor diameter greater than 5 cm.     Patient to return in three weeks. Rx write  The patient is aware to call back for any questions or concerns.  HPI, Physical Exam, Assessment and Plan have been scribed under the direction and in the presence of Chelsea Ard, MD.  Chelsea Hudson, CMA  I have completed the exam and reviewed the above documentation for accuracy and completeness.  I agree with the above.  Haematologist has been used and any errors in dictation or transcription are unintentional.  Chelsea Hudson, M.D., F.A.C.S.  Chelsea Hudson 11/06/2017, 6:43 PM

## 2017-11-06 NOTE — Patient Instructions (Addendum)
Patient to return in three  weeks. The patient is aware to call back for any questions or concerns.

## 2017-11-09 ENCOUNTER — Other Ambulatory Visit: Payer: Self-pay

## 2017-11-09 ENCOUNTER — Encounter: Payer: Self-pay | Admitting: Nurse Practitioner

## 2017-11-09 ENCOUNTER — Ambulatory Visit (INDEPENDENT_AMBULATORY_CARE_PROVIDER_SITE_OTHER): Payer: Medicaid Other | Admitting: Nurse Practitioner

## 2017-11-09 VITALS — BP 128/81 | HR 90 | Temp 98.1°F | Ht 66.0 in | Wt 257.4 lb

## 2017-11-09 DIAGNOSIS — R300 Dysuria: Secondary | ICD-10-CM

## 2017-11-09 DIAGNOSIS — T148XXA Other injury of unspecified body region, initial encounter: Secondary | ICD-10-CM

## 2017-11-09 DIAGNOSIS — N3000 Acute cystitis without hematuria: Secondary | ICD-10-CM | POA: Diagnosis not present

## 2017-11-09 LAB — POCT URINALYSIS DIPSTICK
Bilirubin, UA: NEGATIVE
Blood, UA: NEGATIVE
Glucose, UA: NEGATIVE
Ketones, UA: NEGATIVE
Nitrite, UA: NEGATIVE
Odor: NEGATIVE
Protein, UA: NEGATIVE
Spec Grav, UA: 1.015 (ref 1.010–1.025)
Urobilinogen, UA: 0.2 E.U./dL
pH, UA: 6.5 (ref 5.0–8.0)

## 2017-11-09 MED ORDER — CEPHALEXIN 500 MG PO CAPS: 500.0000 mg | ORAL_CAPSULE | Freq: Two times a day (BID) | ORAL | 0 refills | Status: AC

## 2017-11-09 NOTE — Patient Instructions (Addendum)
Mairlyn, Thank you for coming in to clinic today.  You likely have a UTI - START Keflex 500mg  2 times daily for next 5 days.   - Perform good hygiene by changing undergarments twice daily or when soiled.  Keep vaginal area dry - avoid use of pads and pantyliners. - Wipe front to back when using the restroom and cleaning.   Please schedule a follow-up appointment with Cassell Smiles, AGNP. Return 5-7 days if symptoms worsen or fail to improve.  If you have any other questions or concerns, please feel free to call the clinic or send a message through [place]. You may also schedule an earlier appointment if necessary.  You will receive a survey after today's visit either digitally by e-mail or paper by [employer]. Your experiences and feedback matter to [place].  Please respond so we know how we are doing as we provide care for you.   Cassell Smiles, DNP, AGNP-BC Adult Gerontology Nurse Practitioner [place]

## 2017-11-09 NOTE — Progress Notes (Signed)
Subjective:    Patient ID: Chelsea Hudson, female    DOB: [birth year], 51 y.o.   MRN: [id number]  Chelsea Hudson is a 51 y.o. female presenting on 11/09/2017 for Urinary Tract Infection (urinary pain at the end of the urinary stream,x 2 days )   HPI Urinary Tract Infection: Patient complains of burning with urination, dysuria, incomplete bladder emptying, suprapubic pressure and urgency She has had symptoms for 2 days. Patient denies back pain, congestion, cough, fever, headache, rhinitis, sorethroat, stomach ache, vaginal discharge and vaginal itching. Patient does not have a history of recurrent UTI.  Patient does not have a history of pyelonephritis.    Mastectomy wound Pt reports draining of the medial aspect of her mastectomy scar.  She reports Dr. Bary Castilla has seen it and "put something brown on it" last visit.  She covers it with a bandaid, but still drains and soils her bra and clothing.  She put neosporin on it last night, but none previously.   Social History   Tobacco Use  . Smoking status: Current Every Day Smoker    Packs/day: 0.25    Years: 25.00    Pack years: 6.25    Types: Cigarettes  . Smokeless tobacco: Never Used  Substance Use Topics  . Alcohol use: Yes    Comment: occas  . Drug use: No    Review of Systems Per HPI unless specifically indicated above     Objective:    BP 128/81 (BP Location: Right Arm, Patient Position: Sitting, Cuff Size: Large)   Pulse 90   Temp 98.1 F (36.7 C) (Oral)   Ht 5\' 6"  (1.676 m)   Wt 257 lb 6.4 oz (116.8 kg)   LMP 01/31/2014 (Approximate) Comment: LMP was 3 years ago.  BMI 41.55 kg/m   Wt Readings from Last 3 Encounters:  11/09/17 257 lb 6.4 oz (116.8 kg)  11/06/17 256 lb (116.1 kg)  10/31/17 255 lb (115.7 kg)    Physical Exam  General - obese, well-appearing, NAD HEENT - Normocephalic, atraumatic Heart - RRR, no murmurs heard Chest Wall:  Right horizontal incision over right anterior chest at level of breast  with poor wound healing of medial 2 cm of incision. Wound drainage: minimal serous drainage today.  Eschar covers 70% of open wound bed.  Remaining 30 % is covered with granulation tissue.  No periwound erythema or edema. All other surgical incision well healed with scar formation. Lungs - Clear throughout all lobes, no wheezing, crackles, or rhonchi. Normal work of breathing. Abdomen - soft, suprapubic tenderness, no masses, no hepatosplenomegaly, active bowel sounds, no CVAT Extremeties - non-tender, no edema, cap refill < 2 seconds, peripheral pulses intact +2 bilaterally Skin - warm, dry Neuro - awake, alert, oriented x3, normal gait Psych - Normal mood and affect, normal behavior      Assessment & Plan:   Problem List Items Addressed This Visit    None    Visit Diagnoses    Acute cystitis without hematuria    -  Primary Acute cystitis without hematuria.  Pt symptomatic currently with burning at end of urination and increased suprapubic pressure x 2 days. Currently without systemic signs or symptoms of infection.  POCT dipstick shows leukocyturia - No current risk of concurrent STI, vaginitis.  Plan: 1. START Keflex 500mg  2 times daily for next 5 days.   2. Provided non-pharm measures for UTI prevention for good hygiene. 3. Drink plenty of fluids and improve hydration over next 1  week. 4. Provided precautions for severe symptoms requiring ED visit to include no urine in 24-48 hours. 5. Followup 5-7 days as needed for worsening or persistent symptoms.   Relevant Medications   cephALEXin (KEFLEX) 500 MG capsule   Dysuria     POCT dipstick today.  See AP acute cystitis above.   Relevant Orders   POCT Urinalysis Dipstick (Completed)   Skin wound from surgical incision     Right mastectomy surgical incision with delayed healing of most medial 2 cm of the incision. Wound is draining serous drainage today.  Eschar covers 70% of open wound bed.  Remaining 30 % is covered with granulation  tissue.  No periwound erythema or edema. - No signs of active infection.  Plan: 1. Avoid using neosporin.  Use only saline or standard showering and cleansing with mild soap and water. 2. Keep wound clean and dry.  Avoid using bandaids that will keep wound wet.  Apply simple gauze dressing with small piece of tape between the fold of skin.  Change twice daily or more frequently if needed. 3. If develops drainage that is cloudy, wound becomes red or swollen pt was instructed to call Dr. Bary Castilla.      Meds ordered this encounter  Medications  . cephALEXin (KEFLEX) 500 MG capsule    Sig: Take 1 capsule (500 mg total) by mouth 2 (two) times daily for 5 days.    Dispense:  10 capsule    Refill:  0    Order Specific Question:   Supervising Provider    Answer:   Olin Hauser [[id number]]    Follow up plan: Return 5-7 days if symptoms worsen or fail to improve.  Cassell Smiles, DNP, AGPCNP-BC Adult Gerontology Primary Care Nurse Practitioner [facility] 11/09/2017, 10:44 AM

## 2017-11-11 ENCOUNTER — Other Ambulatory Visit: Payer: Self-pay | Admitting: Nurse Practitioner

## 2017-11-11 DIAGNOSIS — T451X5A Adverse effect of antineoplastic and immunosuppressive drugs, initial encounter: Principal | ICD-10-CM

## 2017-11-11 DIAGNOSIS — G620 Drug-induced polyneuropathy: Secondary | ICD-10-CM

## 2017-11-14 ENCOUNTER — Telehealth: Payer: Self-pay | Admitting: Nurse Practitioner

## 2017-11-14 DIAGNOSIS — G620 Drug-induced polyneuropathy: Secondary | ICD-10-CM

## 2017-11-14 DIAGNOSIS — T451X5A Adverse effect of antineoplastic and immunosuppressive drugs, initial encounter: Principal | ICD-10-CM

## 2017-11-14 MED ORDER — GABAPENTIN 100 MG PO CAPS: ORAL_CAPSULE | ORAL | 3 refills | Status: DC

## 2017-11-14 NOTE — Telephone Encounter (Signed)
Pt needs a refill on neuropathy medication sent to [facility].  She asked for a call back when it was sent [phone]

## 2017-11-14 NOTE — Telephone Encounter (Signed)
Refill sent.  Pt called to inform of the refill.

## 2017-11-21 NOTE — Progress Notes (Signed)
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  INTERVAL HISTORY: Patient returns to clinic today for further evaluation.  Patient is now fully recovered from her surgeries and feels back to her baseline.  She had consultation with radiation oncology today for consideration of adjuvant XRT.  She denies any peripheral neuropathy or other neurologic complaints. She denies any recent fevers or illnesses. She has a good appetite and denies weight loss. She has no chest pain or shortness of breath. She denies any nausea, vomiting, constipation, or diarrhea. She has no urinary complaints. Patient offers no further specific complaints today.  REVIEW OF SYSTEMS:   Review of Systems  Constitutional: Negative.  Negative for fever, malaise/fatigue and weight loss.  HENT: Negative.  Negative for congestion.   Respiratory: Negative.  Negative for cough and shortness of breath.   Cardiovascular: Negative.  Negative for chest pain and leg swelling.  Gastrointestinal: Negative.  Negative for abdominal pain, nausea and vomiting.  Genitourinary: Negative.   Skin: Negative.  Negative for rash.  Neurological: Negative.  Negative for sensory change and weakness.  Psychiatric/Behavioral: Negative.  The patient is not nervous/anxious.      As per HPI. Otherwise, a complete review of systems is negative.  PAST MEDICAL HISTORY: Past Medical History:  Diagnosis Date  . Arthritis    SHOULDER  . Cancer (HCC) 02/28/2017   INVASIVE MAMMARY CARCINOMA WITH MUCINOUS  FEATURES.  . Colonic diverticular abscess 06/21/2017   Colonoscopy 06/25/2017: No evidence of malignancy.  . Hypertension   . Irregular heart beat    PT STATES IT "SKIPS A BEAT"   . Obesity   . Personal history of chemotherapy     PAST SURGICAL HISTORY: Past Surgical History:  Procedure Laterality Date  . ANKLE SURGERY    . BREAST BIOPSY Right 02/28/2017   INVASIVE MAMMARY CARCINOMA WITH MUCINOUS FEATURES.  . BREAST CYST ASPIRATION Right    NEG  . COLONOSCOPY WITH PROPOFOL N/A 06/25/2017   Procedure: COLONOSCOPY WITH PROPOFOL;  Surgeon: Anna, Kiran, MD;  Location: [facility] ENDOSCOPY;  Service: Gastroenterology;  Laterality: N/A;  . MASTECTOMY, PARTIAL Right 07/30/2017   Procedure: MASTECTOMY PARTIAL;  Surgeon: Byrnett, Jeffrey W, MD;  Location: [facility] ORS;  Service: General;  Laterality: Right;  . PORTACATH PLACEMENT Left 03/15/2017   Procedure: INSERTION PORT-A-CATH;  Surgeon: Jeffrey W Byrnett, MD;  Location: [facility] ORS;  Service: General;  Laterality: Left;  . RE-EXCISION OF BREAST LUMPECTOMY Right 08/17/2017    INVASIVE CARCINOMA EXTENDS TO NEW LATERAL MARGIN. /RE-EXCISION OF BREAST LUMPECTOMY;: Byrnett, Jeffrey W, MD;  [facility] ORS; General;  Laterality: Right;  . SENTINEL NODE BIOPSY Right 07/30/2017   Procedure: SENTINEL NODE BIOPSY;  Surgeon: Byrnett, Jeffrey W, MD;  Location: [facility] ORS;  Service: General;  Laterality: Right;  . SIMPLE MASTECTOMY WITH AXILLARY SENTINEL NODE BIOPSY Right 09/28/2017   Procedure: SIMPLE MASTECTOMY;  Surgeon: Byrnett, Jeffrey W, MD;  Location: [facility] ORS;  Service: General;  Laterality: Right;    FAMILY HISTORY: Family History  Problem Relation Age of Onset  . Diabetes Father   . Stroke Father   .   Breast cancer Mother 42  . Brain cancer Maternal Aunt 60  . Colon cancer Neg Hx     ADVANCED DIRECTIVES (Y/N):  N  HEALTH MAINTENANCE: Social History   Tobacco Use  . Smoking status: Current Every Day Smoker    Packs/day: 0.25    Years: 25.00    Pack years:  6.25    Types: Cigarettes  . Smokeless tobacco: Never Used  Substance Use Topics  . Alcohol use: Yes    Comment: occas  . Drug use: No     Colonoscopy:  PAP:  Bone density:  Lipid panel:  Allergies  Allergen Reactions  . Other Hives and Itching    [name]     Current Outpatient Medications  Medication Sig Dispense Refill  . diphenhydrAMINE (BENADRYL) 25 MG tablet Take 1 tablet (25 mg total) by mouth at bedtime as needed for sleep. 30 tablet 1  . gabapentin (NEURONTIN) 100 MG capsule TAKE 1 CAPSULE BY MOUTH THREE TIMES DAILY OR TAKE 3 CAPSULES AT BEDTIME 270 capsule 3  . lidocaine-prilocaine (EMLA) cream Apply to affected area once (Patient taking differently: Apply 1 application topically as needed (befor Dr appointments). Apply to affected area once) 30 g 3  . triamterene-hydrochlorothiazide (MAXZIDE-25) 37.5-25 MG tablet Take 1 tablet daily by mouth. 90 tablet 1  . Elastic Bandages & Supports (TRUFORM ARM SLEEVE L 20-30MMHG) MISC 1 Device by Does not apply route daily. (Patient not taking: Reported on 11/22/2017) 1 each 0   No current facility-administered medications for this visit.     OBJECTIVE: Vitals:   11/22/17 1141  BP: (!) 146/101  Pulse: 88  Resp: 16  Temp: 98 F (36.7 C)     There is no height or weight on file to calculate BMI.    ECOG FS:0 - Asymptomatic  General: Well-developed, well-nourished, no acute distress. Eyes: Pink conjunctiva, anicteric sclera. Breasts: Right mastectomy. Heart: Regular rate and rhythm. No rubs, murmurs, or gallops. Abdomen: Soft, tender, non-distended. No organomegaly noted, normoactive bowel sounds. Musculoskeletal: No edema, cyanosis, or clubbing. Neuro: Alert, answering all questions appropriately. Cranial nerves grossly intact. Skin: No rashes or petechiae noted. Psych: Normal affect.  LAB RESULTS:  Lab Results    Component Value Date   NA 137 09/12/2017   K 3.5 09/12/2017   CL 102 09/12/2017   CO2 27 09/12/2017   GLUCOSE 141 (H) 09/12/2017   BUN 12 09/12/2017   CREATININE 0.75 09/12/2017   CALCIUM 9.2 09/12/2017   PROT 7.1 09/12/2017   ALBUMIN 3.4 (L) 09/12/2017   AST 21 09/12/2017   ALT 23 09/12/2017   ALKPHOS 95 09/12/2017   BILITOT 0.4 09/12/2017   GFRNONAA >60 09/12/2017   GFRAA >60 09/12/2017    Lab Results  Component Value Date   WBC 5.4 09/12/2017   NEUTROABS 4.1 09/12/2017   HGB 11.5 (L) 09/12/2017   HCT 35.5 09/12/2017   MCV 82.2 09/12/2017   PLT 328 09/12/2017     STUDIES: No results found.  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  PLAN:   1. 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 Treatment was then discontinued secondary to worsening peripheral  neuropathy. Patient's initial surgery was on July 30, 2017.  She required multiple excisions to obtain clear margins resulting in a full mastectomy with her final surgery occurring on September 28, 2017.  Given the extent of residual disease, it was agreed upon that despite a full mastectomy, she still should undergo adjuvant XRT.  Patient had consultation with radiation oncology today.  Given the ER/PR status of her tumor she will benefit from either tamoxifen or an aromatase inhibitor for a minimum of 5 years at the conclusion of her XRT.  Return to clinic in 8 weeks for further evaluation and initiation of treatment. 2. Genetic testing: Negative. 3. Left upper and lower abdominal pain: Resolved. Patient had colonoscopy that did not reveal any malignancy, but did reveal an abscess.    4. Anemia: Stable, monitor. 5. Joint pain: Improved. Patient does not complain of pain today.   6. Peripheral neuropathy: Resolved.  Approximately 30 minutes was spent in discussion of which greater than 50% was consultation.  Patient expressed understanding and was in agreement with this plan. She also understands that She can call clinic at any time with any questions, concerns, or complaints.   Cancer Staging Malignant neoplasm of upper-outer quadrant of right breast in female, estrogen receptor positive (HCC) Staging form: Breast, AJCC 8th Edition - Clinical stage from 03/05/2017: Stage IIA (cT2, cN2a, cM0, G2, ER: Positive, PR: Positive, HER2: Negative) - Signed by ,  J, MD on 08/10/2017 - Pathologic stage from 08/10/2017: No Stage Recommended (ypT1c, pN3a, cM0, G2, ER: Positive, PR: Positive, HER2: Negative) - Signed by ,  J, MD on 08/10/2017    J , MD   11/23/2017 3:11 PM     

## 2017-11-22 ENCOUNTER — Other Ambulatory Visit: Payer: Self-pay

## 2017-11-22 ENCOUNTER — Encounter: Payer: Self-pay | Admitting: Oncology

## 2017-11-22 ENCOUNTER — Encounter: Payer: Self-pay | Admitting: Radiation Oncology

## 2017-11-22 ENCOUNTER — Ambulatory Visit
Admission: RE | Admit: 2017-11-22 | Discharge: 2017-11-22 | Disposition: A | Discharge status: Home / Self Care | Payer: Medicaid Other | Source: Ambulatory Visit | Attending: Radiation Oncology | Admitting: Radiation Oncology

## 2017-11-22 ENCOUNTER — Inpatient Hospital Stay: Payer: Medicaid Other | Attending: Oncology | Admitting: Oncology

## 2017-11-22 VITALS — BP 144/90 | HR 92 | Temp 98.0°F | Resp 18 | Wt 262.3 lb

## 2017-11-22 VITALS — BP 146/101 | HR 88 | Temp 98.0°F | Resp 16

## 2017-11-22 DIAGNOSIS — Z9221 Personal history of antineoplastic chemotherapy: Secondary | ICD-10-CM | POA: Insufficient documentation

## 2017-11-22 DIAGNOSIS — M129 Arthropathy, unspecified: Secondary | ICD-10-CM | POA: Diagnosis not present

## 2017-11-22 DIAGNOSIS — Z8619 Personal history of other infectious and parasitic diseases: Secondary | ICD-10-CM | POA: Insufficient documentation

## 2017-11-22 DIAGNOSIS — E669 Obesity, unspecified: Secondary | ICD-10-CM | POA: Diagnosis not present

## 2017-11-22 DIAGNOSIS — I499 Cardiac arrhythmia, unspecified: Secondary | ICD-10-CM | POA: Insufficient documentation

## 2017-11-22 DIAGNOSIS — D649 Anemia, unspecified: Secondary | ICD-10-CM | POA: Insufficient documentation

## 2017-11-22 DIAGNOSIS — C50411 Malignant neoplasm of upper-outer quadrant of right female breast: Secondary | ICD-10-CM

## 2017-11-22 DIAGNOSIS — Z170 Estrogen receptor positive status [ER+]: Secondary | ICD-10-CM | POA: Insufficient documentation

## 2017-11-22 DIAGNOSIS — Z79899 Other long term (current) drug therapy: Secondary | ICD-10-CM | POA: Insufficient documentation

## 2017-11-22 DIAGNOSIS — Z808 Family history of malignant neoplasm of other organs or systems: Secondary | ICD-10-CM | POA: Insufficient documentation

## 2017-11-22 DIAGNOSIS — Z9011 Acquired absence of right breast and nipple: Secondary | ICD-10-CM | POA: Insufficient documentation

## 2017-11-22 DIAGNOSIS — Z8719 Personal history of other diseases of the digestive system: Secondary | ICD-10-CM | POA: Insufficient documentation

## 2017-11-22 DIAGNOSIS — F17210 Nicotine dependence, cigarettes, uncomplicated: Secondary | ICD-10-CM | POA: Insufficient documentation

## 2017-11-22 DIAGNOSIS — I10 Essential (primary) hypertension: Secondary | ICD-10-CM | POA: Diagnosis not present

## 2017-11-22 DIAGNOSIS — Z803 Family history of malignant neoplasm of breast: Secondary | ICD-10-CM | POA: Diagnosis not present

## 2017-11-22 NOTE — Consult Note (Signed)
NEW PATIENT EVALUATION  Name: Chelsea Hudson  MRN: [id number]  Date:   11/22/2017     DOB: 1966/03/21   This 52 y.o. female patient presents to the clinic for initial evaluation of stage IIIc (T1 CN IIIa M0) ER/PR positive HER-2/neu negative invasive mammary carcinoma the upper outer quadrant of the right breast status post neoadjuvant chemotherapy followed by right modified radical mastectomy and axillary node dissection.  REFERRING PHYSICIAN: Mikey College, *  CHIEF COMPLAINT:  Chief Complaint  Patient presents with  . Breast Cancer    Pt is here for initial consultation of breast cancer.      DIAGNOSIS: The encounter diagnosis was Malignant neoplasm of upper-outer quadrant of right breast in female, estrogen receptor positive ([place]).   PREVIOUS INVESTIGATIONS:  Pathology reports reviewed Mammogram ultrasound and PET/CT scan reviewed Clinical notes reviewed  HPI: Patient is a 52 year old female who noted an enlarging mass in her right breast. She was found on both mammography and ultrasound to have a hypoechoic mass measuring 2.1 x 1 cm the 11:00 position 8 cm from nipple in the right breast. Ultrasound also mistreated bulky adenopathy in the right axilla as well as enlarged lymph nodes deep to the pectoralis major muscle. PET CT scan confirmed hypermetabolic activity in the right axilla and subpectoral region consistent with metastatic disease to her low nodes. Patient on a wide local excision showing a 1.8 cm invasive mammary carcinoma with mucinous features. There is lymph vascular invasion present. 8 of 12 lymph nodes were positive for metastatic disease. She also 2 of 2 sentinel lymph nodes positive for metastatic disease. Patient was started on chemotherapy receiving 7 of 12 cycles of Taxol discontinue second to worsening peripheral neuropathy. She then underwent a mastectomy after 3 surgeries trying to obtain clear margins. At the time of mastectomy there was residual  invasive mammary carcinoma with margins negative. Angiolymphatic invasion was present. There was residual 1.8 cm of residual tumor. Patient has done well postoperatively. She is planning on repeat breast reconstruction. She is now referred for consideration of postmastectomy radiation therapy. She states the medial part of her margin is still draining's slight material.  PLANNED TREATMENT REGIMEN: Right chest wall peripheral hepatic radiation  PAST MEDICAL HISTORY:  has a past medical history of Arthritis, Cancer ([place]) (02/28/2017), Colonic diverticular abscess (06/21/2017), Hypertension, Irregular heart beat, Obesity, and Personal history of chemotherapy.    PAST SURGICAL HISTORY:  Past Surgical History:  Procedure Laterality Date  . ANKLE SURGERY    . BREAST BIOPSY Right 02/28/2017   INVASIVE MAMMARY CARCINOMA WITH MUCINOUS FEATURES.  Marland Kitchen BREAST CYST ASPIRATION Right    NEG  . COLONOSCOPY WITH PROPOFOL N/A 06/25/2017   Procedure: COLONOSCOPY WITH PROPOFOL;  Surgeon: Jonathon Bellows, MD;  Location: [facility] ENDOSCOPY;  Service: Gastroenterology;  Laterality: N/A;  . MASTECTOMY, PARTIAL Right 07/30/2017   Procedure: MASTECTOMY PARTIAL;  Surgeon: Robert Bellow, MD;  Location: [facility] ORS;  Service: General;  Laterality: Right;  . PORTACATH PLACEMENT Left 03/15/2017   Procedure: INSERTION PORT-A-CATH;  Surgeon: Robert Bellow, MD;  Location: [facility] ORS;  Service: General;  Laterality: Left;  . RE-EXCISION OF BREAST LUMPECTOMY Right 08/17/2017    INVASIVE CARCINOMA EXTENDS TO NEW LATERAL MARGIN. /RE-EXCISION OF BREAST LUMPECTOMY;: Byrnett, Forest Gleason, MD;  [facility] ORS; General;  Laterality: Right;  . SENTINEL NODE BIOPSY Right 07/30/2017   Procedure: SENTINEL NODE BIOPSY;  Surgeon: Robert Bellow, MD;  Location: [facility] ORS;  Service: General;  Laterality: Right;  . SIMPLE  MASTECTOMY WITH AXILLARY SENTINEL NODE BIOPSY Right 09/28/2017   Procedure: SIMPLE MASTECTOMY;  Surgeon: Robert Bellow, MD;  Location:  [facility] ORS;  Service: General;  Laterality: Right;    FAMILY HISTORY: family history includes Brain cancer (age of onset: 34) in her maternal aunt; Breast cancer (age of onset: 55) in her mother; Diabetes in her father; Stroke in her father.  SOCIAL HISTORY:  reports that she has been smoking cigarettes.  She has a 6.25 pack-year smoking history. she has never used smokeless tobacco. She reports that she drinks alcohol. She reports that she does not use drugs.  ALLERGIES: Other  MEDICATIONS:  Current Outpatient Medications  Medication Sig Dispense Refill  . diphenhydrAMINE (BENADRYL) 25 MG tablet Take 1 tablet (25 mg total) by mouth at bedtime as needed for sleep. 30 tablet 1  . gabapentin (NEURONTIN) 100 MG capsule TAKE 1 CAPSULE BY MOUTH THREE TIMES DAILY OR TAKE 3 CAPSULES AT BEDTIME 270 capsule 3  . lidocaine-prilocaine (EMLA) cream Apply to affected area once (Patient taking differently: Apply 1 application topically as needed (befor Dr appointments). Apply to affected area once) 30 g 3  . triamterene-hydrochlorothiazide (MAXZIDE-25) 37.5-25 MG tablet Take 1 tablet daily by mouth. 90 tablet 1  . Elastic Bandages & Supports (TRUFORM ARM SLEEVE L 20-30MMHG) MISC 1 Device by Does not apply route daily. (Patient not taking: Reported on 11/22/2017) 1 each 0   No current facility-administered medications for this encounter.     ECOG PERFORMANCE STATUS:  0 - Asymptomatic  REVIEW OF SYSTEMS:  Patient denies any weight loss, fatigue, weakness, fever, chills or night sweats. Patient denies any loss of vision, blurred vision. Patient denies any ringing  of the ears or hearing loss. No irregular heartbeat. Patient denies heart murmur or history of fainting. Patient denies any chest pain or pain radiating to her upper extremities. Patient denies any shortness of breath, difficulty breathing at night, cough or hemoptysis. Patient denies any swelling in the lower legs. Patient denies any nausea vomiting,  vomiting of blood, or coffee ground material in the vomitus. Patient denies any stomach pain. Patient states has had normal bowel movements no significant constipation or diarrhea. Patient denies any dysuria, hematuria or significant nocturia. Patient denies any problems walking, swelling in the joints or loss of balance. Patient denies any skin changes, loss of hair or loss of weight. Patient denies any excessive worrying or anxiety or significant depression. Patient denies any problems with insomnia. Patient denies excessive thirst, polyuria, polydipsia. Patient denies any swollen glands, patient denies easy bruising or easy bleeding. Patient denies any recent infections, allergies or URI. Patient "s visual fields have not changed significantly in recent time.    PHYSICAL EXAM: BP (!) 144/90   Pulse 92   Temp 98 F (36.7 C)   Resp 18   Wt 262 lb 5.6 oz (119 kg)   LMP 01/31/2014 (Approximate) Comment: LMP was 3 years ago.  BMI 42.34 kg/m  No evidence of lymphedema in her right upper extremity. She is status post right modified radical mastectomy with incision healing well. No evidence of nodularity or mass in the mastectomy scars noted. Left breast is free of dominant mass or nodularity in 2 positions examined. No evidence of axillary or supraclavicular adenopathy is appreciated. Well-developed well-nourished patient in NAD. HEENT reveals PERLA, EOMI, discs not visualized.  Oral cavity is clear. No oral mucosal lesions are identified. Neck is clear without evidence of cervical or supraclavicular adenopathy. Lungs are clear to A&P.  Cardiac examination is essentially unremarkable with regular rate and rhythm without murmur rub or thrill. Abdomen is benign with no organomegaly or masses noted. Motor sensory and DTR levels are equal and symmetric in the upper and lower extremities. Cranial nerves II through XII are grossly intact. Proprioception is intact. No peripheral adenopathy or edema is identified.  No motor or sensory levels are noted. Crude visual fields are within normal range.   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 I would plan on delivering 5040 cGy in 28 fractions to her right mastectomy scar and peripheral lymphatics. Risks and benefits of treatment including possibility of lymphedema for which I've recommended continual exercise right upper extremity fatigue alteration of blood counts possible inclusion of superficial lung skin reaction all were discussed in detail with the patient. She seems to comprehend my treatment plan well. I like to allow a little more time for her scar to heal since there is some residual drainage and I have set her up in about 2 weeks for CT simulation.There will be extra effort by both professional staff as well as technical staff to coordinate and manage concurrent chemoradiation and ensuing side effects during her treatments. I have personally set up and ordered CT simulation. Patient also will be candidate for antiestrogen therapy after completion of radiation.  I would like to take this opportunity to thank you for allowing me to participate in the care of your patient.Noreene Filbert, MD

## 2017-11-22 NOTE — Progress Notes (Signed)
Patient here for follow up no changes since last appointment 

## 2017-11-23 ENCOUNTER — Telehealth: Payer: Self-pay | Admitting: Nurse Practitioner

## 2017-11-23 DIAGNOSIS — N3000 Acute cystitis without hematuria: Secondary | ICD-10-CM

## 2017-11-23 MED ORDER — NITROFURANTOIN MONOHYD MACRO 100 MG PO CAPS: 100.0000 mg | ORAL_CAPSULE | Freq: Two times a day (BID) | ORAL | 0 refills | Status: AC

## 2017-11-23 NOTE — Telephone Encounter (Signed)
Pt called requesting refill on  Medication  For her  UTI. Pt. Call back # [phone].

## 2017-11-23 NOTE — Telephone Encounter (Signed)
The pt still complaining of burning w/ urination and lower abdominal pain

## 2017-11-26 ENCOUNTER — Telehealth: Payer: Self-pay | Admitting: *Deleted

## 2017-11-26 NOTE — Telephone Encounter (Signed)
Patient asking to be referred to [facility] for radiation therapy. Please make referral and notify patient

## 2017-11-27 ENCOUNTER — Ambulatory Visit: Payer: Medicaid Other | Admitting: General Surgery

## 2017-11-27 ENCOUNTER — Other Ambulatory Visit: Payer: Self-pay | Admitting: *Deleted

## 2017-11-27 ENCOUNTER — Ambulatory Visit: Payer: Medicaid Other

## 2017-11-27 DIAGNOSIS — C50919 Malignant neoplasm of unspecified site of unspecified female breast: Secondary | ICD-10-CM

## 2017-11-27 NOTE — Telephone Encounter (Signed)
Per Dr Grayland Ormond, ok to refer to [facility]

## 2017-11-27 NOTE — Telephone Encounter (Signed)
Done

## 2017-12-04 ENCOUNTER — Encounter: Payer: Self-pay | Admitting: General Surgery

## 2017-12-04 ENCOUNTER — Ambulatory Visit (INDEPENDENT_AMBULATORY_CARE_PROVIDER_SITE_OTHER): Payer: Medicaid Other | Admitting: General Surgery

## 2017-12-04 VITALS — BP 122/82 | HR 96 | Resp 16 | Ht 66.0 in | Wt 259.0 lb

## 2017-12-04 DIAGNOSIS — Z170 Estrogen receptor positive status [ER+]: Secondary | ICD-10-CM

## 2017-12-04 DIAGNOSIS — C50411 Malignant neoplasm of upper-outer quadrant of right female breast: Secondary | ICD-10-CM

## 2017-12-04 NOTE — Patient Instructions (Addendum)
The patient is aware to call back for any questions or concerns. Return in three months

## 2017-12-04 NOTE — Progress Notes (Addendum)
Patient ID: Chelsea Hudson, female   DOB: Jun 21, 1966, 52 y.o.   MRN: [id number]  Chief Complaint  Patient presents with  . Follow-up    HPI Chelsea Hudson is a 52 y.o. female.  Here for follow up right mastectomy done 09-28-17. Occasional tightness in right chest lasting all day but not every day. She has quit smoking.  Patient has been seen by radiation oncology .  HPI  Past Medical History:  Diagnosis Date  . Arthritis    SHOULDER  . Cancer ([place]) 02/28/2017   INVASIVE MAMMARY CARCINOMA WITH MUCINOUS FEATURES.  . Colonic diverticular abscess 06/21/2017   Colonoscopy 06/25/2017: No evidence of malignancy.  . Hypertension   . Irregular heart beat    PT STATES IT "SKIPS A BEAT"   . Obesity   . Personal history of chemotherapy     Past Surgical History:  Procedure Laterality Date  . ANKLE SURGERY    . BREAST BIOPSY Right 02/28/2017   INVASIVE MAMMARY CARCINOMA WITH MUCINOUS FEATURES.  Marland Kitchen BREAST CYST ASPIRATION Right    NEG  . COLONOSCOPY WITH PROPOFOL N/A 06/25/2017   Procedure: COLONOSCOPY WITH PROPOFOL;  Surgeon: Jonathon Bellows, MD;  [facility] END[facility];  Service: Gastroenterology;  Laterality: N/A;  . MASTECTOMY, PARTIAL Right 07/30/2017   Procedure: MASTECTOMY PARTIAL;  Surgeon: Robert Bellow, MD;  Location: [facility] ORS;  Service: General;  Laterality: Right;  . PORTACATH PLACEMENT Left 03/15/2017   Procedure: INSERTION PORT-A-CATH;  Surgeon: Robert Bellow, MD;  Location: [facility] ORS;  Service: General;  Laterality: Left;  . RE-EXCISION OF BREAST LUMPECTOMY Right 08/17/2017    INVASIVE CARCINOMA EXTENDS TO NEW LATERAL MARGIN. /RE-EXCISION OF BREAST LUMPECTOMY;: Byrnett, Forest Gleason, MD;  [facility] ORS; General;  Laterality: Right;  . SENTINEL NODE BIOPSY Right 07/30/2017   Procedure: SENTINEL NODE BIOPSY;  Surgeon: Robert Bellow, MD;  Location: [facility] ORS;  Service: General;  Laterality: Right;  . SIMPLE MASTECTOMY WITH AXILLARY SENTINEL NODE BIOPSY Right 09/28/2017    Procedure: SIMPLE MASTECTOMY;  Surgeon: Robert Bellow, MD;  Location: [facility] ORS;  Service: General;  Laterality: Right;    Family History  Problem Relation Age of Onset  . Diabetes Father   . Stroke Father   . Breast cancer Mother 77  . Brain cancer Maternal Aunt 60  . Colon cancer Neg Hx     Social History Social History   Tobacco Use  . Smoking status: Former Smoker    Packs/day: 0.25    Years: 25.00    Pack years: 6.25    Types: Cigarettes    Last attempt to quit: 10/20/2017    Years since quitting: 0.1  . Smokeless tobacco: Never Used  Substance Use Topics  . Alcohol use: Yes    Comment: occas  . Drug use: No    Allergies  Allergen Reactions  . Other Hives and Itching    [name]     Current Outpatient Medications  Medication Sig Dispense Refill  . diphenhydrAMINE (BENADRYL) 25 MG tablet Take 1 tablet (25 mg total) by mouth at bedtime as needed for sleep. 30 tablet 1  . Elastic Bandages & Supports (TRUFORM ARM SLEEVE L 20-30MMHG) MISC 1 Device by Does not apply route daily. 1 each 0  . gabapentin (NEURONTIN) 100 MG capsule TAKE 1 CAPSULE BY MOUTH THREE TIMES DAILY OR TAKE 3 CAPSULES AT BEDTIME 270 capsule 3  .  lidocaine-prilocaine (EMLA) cream Apply to affected area once (Patient taking differently: Apply 1 application topically as needed (befor Dr appointments). Apply to affected area once) 30 g 3  . triamterene-hydrochlorothiazide (MAXZIDE-25) 37.5-25 MG tablet Take 1 tablet daily by mouth. 90 tablet 1   No current facility-administered medications for this visit.     Review of Systems Review of Systems  Constitutional: Negative.   Respiratory: Positive for cough.   Cardiovascular: Negative.     Blood pressure 122/82, pulse 96, resp. rate 16, height 5\' 6"  (1.676 m), weight 259 lb (117.5 kg), last menstrual period 01/31/2014, SpO2 98 %.  Physical  Exam Physical Exam  Constitutional: She is oriented to person, place, and time. She appears well-developed and well-nourished.  Cardiovascular: Normal rate, regular rhythm and normal heart sounds.  Pulmonary/Chest: Effort normal and breath sounds normal.    Well healed right mastectomy incision  Neurological: She is alert and oriented to person, place, and time.  Skin: Skin is warm and dry.  Psychiatric: Her behavior is normal.     Measurement of the upper extremities was obtained to the location 15 cm above as well as 10 and 20 cm below the olecranon process.  Right: 43, 32, 23.5 cm. Left: 43, 31, 24 cm.   Data Reviewed Radiation oncology note November 22, 2017  Assessment    Doing well post mastectomy.  Candidate for postmastectomy radiation based on tumor size (5.1 cm) and multiple lymph nodes (8/12).    Plan    The patient is interested in postmastectomy reconstruction.  She is encouraged to continue to remain free of nicotine.  The redundant skin may be of use at the time of breast reconstruction if this occurs and will not be resected at this time.    Patient to return in three months. The patient is aware to call back for any questions or concerns.   HPI, Physical Exam, Assessment and Plan have been scribed under the direction and in the presence of Robert Bellow, MD. Karie Fetch, RN  I have completed the exam and reviewed the above documentation for accuracy and completeness.  I agree with the above.  Haematologist has been used and any errors in dictation or transcription are unintentional.  Hervey Ard, M.D., F.A.C.S.  Forest Gleason Byrnett 12/05/2017, 8:57 PM

## 2017-12-22 ENCOUNTER — Emergency Department
Admission: EM | Admit: 2017-12-22 | Discharge: 2017-12-22 | Disposition: A | Discharge status: Home / Self Care | Payer: Medicaid Other | Attending: Emergency Medicine | Admitting: Emergency Medicine

## 2017-12-22 ENCOUNTER — Emergency Department: Payer: Medicaid Other

## 2017-12-22 DIAGNOSIS — R1013 Epigastric pain: Secondary | ICD-10-CM | POA: Insufficient documentation

## 2017-12-22 DIAGNOSIS — Z5321 Procedure and treatment not carried out due to patient leaving prior to being seen by health care provider: Secondary | ICD-10-CM | POA: Insufficient documentation

## 2017-12-22 LAB — BASIC METABOLIC PANEL
Anion gap: 9 (ref 5–15)
BUN: 16 mg/dL (ref 6–20)
CALCIUM: 8.8 mg/dL — AB (ref 8.9–10.3)
CO2: 25 mmol/L (ref 22–32)
Chloride: 102 mmol/L (ref 101–111)
Creatinine, Ser: 1.06 mg/dL — ABNORMAL HIGH (ref 0.44–1.00)
Glucose, Bld: 239 mg/dL — ABNORMAL HIGH (ref 65–99)
Potassium: 3.3 mmol/L — ABNORMAL LOW (ref 3.5–5.1)
Sodium: 136 mmol/L (ref 135–145)

## 2017-12-22 LAB — CBC
HCT: 35.4 % (ref 35.0–47.0)
Hemoglobin: 11.6 g/dL — ABNORMAL LOW (ref 12.0–16.0)
MCH: 26.2 pg (ref 26.0–34.0)
MCHC: 32.9 g/dL (ref 32.0–36.0)
MCV: 79.9 fL — ABNORMAL LOW (ref 80.0–100.0)
PLATELETS: 345 10*3/uL (ref 150–440)
RBC: 4.43 MIL/uL (ref 3.80–5.20)
RDW: 17.8 % — ABNORMAL HIGH (ref 11.5–14.5)
WBC: 8.1 10*3/uL (ref 3.6–11.0)

## 2017-12-22 LAB — BRAIN NATRIURETIC PEPTIDE: B Natriuretic Peptide: 22 pg/mL (ref 0.0–100.0)

## 2017-12-22 LAB — TROPONIN I

## 2017-12-22 NOTE — ED Triage Notes (Signed)
Patient c/o medial chest pain/epigastric pain described as constant discomfort beginning at 2100 today. Patient reports nausea and SOB when lying flat. Patient denies any other accompanying symptoms.

## 2017-12-24 ENCOUNTER — Telehealth: Payer: Self-pay | Admitting: Emergency Medicine

## 2017-12-24 NOTE — Telephone Encounter (Signed)
Called patient due to lwot to inquire about condition and follow up plans.  Patient says she feels this is her stomach.  I advised her to call her pcp so they can be aware of her symptoms and review her labwork.  She agrees.

## 2017-12-28 ENCOUNTER — Encounter: Payer: Self-pay | Admitting: General Surgery

## 2018-01-02 ENCOUNTER — Ambulatory Visit: Payer: Medicaid Other | Admitting: Nurse Practitioner

## 2018-01-02 ENCOUNTER — Other Ambulatory Visit: Payer: Self-pay

## 2018-01-02 ENCOUNTER — Encounter: Payer: Self-pay | Admitting: Nurse Practitioner

## 2018-01-02 VITALS — BP 126/73 | HR 88 | Temp 98.3°F | Ht 66.0 in | Wt 256.4 lb

## 2018-01-02 DIAGNOSIS — I10 Essential (primary) hypertension: Secondary | ICD-10-CM | POA: Diagnosis not present

## 2018-01-02 DIAGNOSIS — T451X5A Adverse effect of antineoplastic and immunosuppressive drugs, initial encounter: Secondary | ICD-10-CM

## 2018-01-02 DIAGNOSIS — R7309 Other abnormal glucose: Secondary | ICD-10-CM

## 2018-01-02 DIAGNOSIS — N3000 Acute cystitis without hematuria: Secondary | ICD-10-CM

## 2018-01-02 DIAGNOSIS — G620 Drug-induced polyneuropathy: Secondary | ICD-10-CM

## 2018-01-02 DIAGNOSIS — I700 Atherosclerosis of aorta: Secondary | ICD-10-CM

## 2018-01-02 LAB — POCT URINALYSIS DIPSTICK
Bilirubin, UA: NEGATIVE
Blood, UA: NEGATIVE
Glucose, UA: NEGATIVE
Ketones, UA: NEGATIVE
Leukocytes, UA: NEGATIVE
Nitrite, UA: NEGATIVE
Protein, UA: NEGATIVE
Spec Grav, UA: 1.02 (ref 1.010–1.025)
Urobilinogen, UA: 0.2 E.U./dL
pH, UA: 5 (ref 5.0–8.0)

## 2018-01-02 NOTE — Patient Instructions (Addendum)
Chelsea Hudson, Thank you for coming in to clinic today.  1. Continue BP med without changes. - Eat a heart healthy diet.  Work on substituting other things instead of your biscuit for breakfast.   2. Call about your radiation as well. [facility] in [place]: [phone]   3. We will call you if you still have a UTI.  Please schedule a follow-up appointment with Cassell Smiles, AGNP. Return in about 3 months (around 04/01/2018) for Hypertension, obesity.  If you have any other questions or concerns, please feel free to call the clinic or send a message through [place]. You may also schedule an earlier appointment if necessary.  You will receive a survey after today's visit either digitally by e-mail or paper by [employer]. Your experiences and feedback matter to [place].  Please respond so we know how we are doing as we provide care for you.   Cassell Smiles, DNP, AGNP-BC Adult Gerontology Nurse Practitioner [place]

## 2018-01-03 ENCOUNTER — Encounter: Payer: Self-pay | Admitting: Nurse Practitioner

## 2018-01-03 DIAGNOSIS — Z9221 Personal history of antineoplastic chemotherapy: Secondary | ICD-10-CM | POA: Insufficient documentation

## 2018-01-03 LAB — LIPID PANEL
Chol/HDL Ratio: 5.2 ratio — ABNORMAL HIGH (ref 0.0–4.4)
Cholesterol, Total: 191 mg/dL (ref 100–199)
HDL: 37 mg/dL — ABNORMAL LOW (ref >39)
LDL Calculated: 130 mg/dL — ABNORMAL HIGH (ref 0–99)
Triglycerides: 119 mg/dL (ref 0–149)
VLDL Cholesterol Cal: 24 mg/dL (ref 5–40)

## 2018-01-03 LAB — HEMOGLOBIN A1C
Est. average glucose Bld gHb Est-mCnc: 200 mg/dL
Hgb A1c MFr Bld: 8.6 % — ABNORMAL HIGH (ref 4.8–5.6)

## 2018-01-03 LAB — URINE CULTURE
MICRO NUMBER:: 90193467
Result:: NO GROWTH
SPECIMEN QUALITY:: ADEQUATE

## 2018-01-04 ENCOUNTER — Telehealth: Payer: Self-pay

## 2018-01-04 NOTE — Telephone Encounter (Signed)
-----   Message from Mikey College, NP sent at 01/03/2018 12:56 PM EST ----- Bary Castilla: please mail a copy of low glycemic handout to patient's home.  Also please schedule followup for 4 weeks.  Lipid panel: worsening.  LDL > 130, improved HDL from prior check.  Total Cholesterol elevated as well.  Triglycerides are consistent with prior values.  Possibly contributing to worsening is weight gain.  Consider adding statin therapy with documented aortic atherosclerosis and possible T2DM new diagnosis.  A1c: 8.6% - Consistent with diagnosis T2DM.   Pt declines additional medications.  Would recommend starting metformin, but pt refuses. - Encouraged low glycemic diet, increasing exercise to 30 minutes daily. - Check fasting am CBG daily.  Bring log to clinic in 4 weeks.

## 2018-01-13 NOTE — Progress Notes (Deleted)
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  INTERVAL HISTORY: Patient returns to clinic today for further evaluation.  Patient is now fully recovered from her surgeries and feels back to her baseline.  She had consultation with radiation oncology today for consideration of adjuvant XRT.  She denies any peripheral neuropathy or other neurologic complaints. She denies any recent fevers or illnesses. She has a good appetite and denies weight loss. She has no chest pain or shortness of breath. She denies any nausea, vomiting, constipation, or diarrhea. She has no urinary complaints. Patient offers no further specific complaints today.  REVIEW OF SYSTEMS:   Review of Systems  Constitutional: Negative.  Negative for fever, malaise/fatigue and weight loss.  HENT: Negative.  Negative for congestion.   Respiratory: Negative.  Negative for cough and shortness of breath.   Cardiovascular: Negative.  Negative for chest pain and leg swelling.  Gastrointestinal: Negative.  Negative for abdominal pain, nausea and vomiting.  Genitourinary: Negative.   Skin: Negative.  Negative for rash.  Neurological: Negative.  Negative for sensory change and weakness.  Psychiatric/Behavioral: Negative.  The patient is not nervous/anxious.      As per HPI. Otherwise, a complete review of systems is negative.  PAST MEDICAL HISTORY: Past Medical History:  Diagnosis Date  . Arthritis    SHOULDER  . Cancer ([place]) 02/28/2017   INVASIVE MAMMARY CARCINOMA WITH MUCINOUS  FEATURES.  . Colonic diverticular abscess 06/21/2017   Colonoscopy 06/25/2017: No evidence of malignancy.  . Hypertension   . Irregular heart beat    PT STATES IT "SKIPS A BEAT"   . Obesity   . Personal history of chemotherapy     PAST SURGICAL HISTORY: Past Surgical History:  Procedure Laterality Date  . ANKLE SURGERY    . BREAST BIOPSY Right 02/28/2017   INVASIVE MAMMARY CARCINOMA WITH MUCINOUS FEATURES.  Marland Kitchen BREAST CYST ASPIRATION Right    NEG  . COLONOSCOPY WITH PROPOFOL N/A 06/25/2017   Procedure: COLONOSCOPY WITH PROPOFOL;  Surgeon: Jonathon Bellows, MD;  Location: [facility] ENDOSCOPY;  Service: Gastroenterology;  Laterality: N/A;  . MASTECTOMY, PARTIAL Right 07/30/2017   Procedure: MASTECTOMY PARTIAL;  Surgeon: Robert Bellow, MD;  Location: [facility] ORS;  Service: General;  Laterality: Right;  . PORTACATH PLACEMENT Left 03/15/2017   Procedure: INSERTION PORT-A-CATH;  Surgeon: Robert Bellow, MD;  Location: [facility] ORS;  Service: General;  Laterality: Left;  . RE-EXCISION OF BREAST LUMPECTOMY Right 08/17/2017    INVASIVE CARCINOMA EXTENDS TO NEW LATERAL MARGIN. /RE-EXCISION OF BREAST LUMPECTOMY;: Byrnett, Forest Gleason, MD;  [facility] ORS; General;  Laterality: Right;  . SENTINEL NODE BIOPSY Right 07/30/2017   Procedure: SENTINEL NODE BIOPSY;  Surgeon: Robert Bellow, MD;  Location: [facility] ORS;  Service: General;  Laterality: Right;  . SIMPLE MASTECTOMY WITH AXILLARY SENTINEL NODE BIOPSY Right 09/28/2017   Procedure: SIMPLE MASTECTOMY;  Surgeon: Robert Bellow, MD;  Location: [facility] ORS;  Service: General;  Laterality: Right;    FAMILY HISTORY: Family History  Problem Relation Age of Onset  . Diabetes Father   . Stroke Father   .  Breast cancer Mother 56  . Brain cancer Maternal Aunt 60  . Colon cancer Neg Hx     ADVANCED DIRECTIVES (Y/N):  N  HEALTH MAINTENANCE: Social History   Tobacco Use  . Smoking status: Former Smoker    Packs/day: 0.25    Years: 25.00    Pack years: 6.25     Types: Cigarettes    Last attempt to quit: 10/20/2017    Years since quitting: 0.2  . Smokeless tobacco: Never Used  Substance Use Topics  . Alcohol use: Yes    Comment: occas  . Drug use: No     Colonoscopy:  PAP:  Bone density:  Lipid panel:  Allergies  Allergen Reactions  . Other Hives and Itching    [name]     Current Outpatient Medications  Medication Sig Dispense Refill  . diphenhydrAMINE (BENADRYL) 25 MG tablet Take 1 tablet (25 mg total) by mouth at bedtime as needed for sleep. 30 tablet 1  . Elastic Bandages & Supports (TRUFORM ARM SLEEVE L 20-30MMHG) MISC 1 Device by Does not apply route daily. (Patient not taking: Reported on 01/02/2018) 1 each 0  . gabapentin (NEURONTIN) 100 MG capsule TAKE 1 CAPSULE BY MOUTH THREE TIMES DAILY OR TAKE 3 CAPSULES AT BEDTIME 270 capsule 3  . lidocaine-prilocaine (EMLA) cream Apply to affected area once (Patient taking differently: Apply 1 application topically as needed (befor Dr appointments). Apply to affected area once) 30 g 3  . triamterene-hydrochlorothiazide (MAXZIDE-25) 37.5-25 MG tablet Take 1 tablet daily by mouth. 90 tablet 1   No current facility-administered medications for this visit.     OBJECTIVE: There were no vitals filed for this visit.   There is no height or weight on file to calculate BMI.    ECOG FS:0 - Asymptomatic  General: Well-developed, well-nourished, no acute distress. Eyes: Pink conjunctiva, anicteric sclera. Breasts: Right mastectomy. Heart: Regular rate and rhythm. No rubs, murmurs, or gallops. Abdomen: Soft, tender, non-distended. No organomegaly noted, normoactive bowel sounds. Musculoskeletal: No edema, cyanosis, or clubbing. Neuro: Alert, answering all questions appropriately. Cranial nerves grossly intact. Skin: No rashes or petechiae noted. Psych: Normal affect.  LAB RESULTS:  Lab Results    Component Value Date   NA 136 12/22/2017   K 3.3 (L) 12/22/2017   CL 102 12/22/2017   CO2 25 12/22/2017   GLUCOSE 239 (H) 12/22/2017   BUN 16 12/22/2017   CREATININE 1.06 (H) 12/22/2017   CALCIUM 8.8 (L) 12/22/2017   PROT 7.1 09/12/2017   ALBUMIN 3.4 (L) 09/12/2017   AST 21 09/12/2017   ALT 23 09/12/2017   ALKPHOS 95 09/12/2017   BILITOT 0.4 09/12/2017   GFRNONAA >60 12/22/2017   GFRAA >60 12/22/2017    Lab Results  Component Value Date   WBC 8.1 12/22/2017   NEUTROABS 4.1 09/12/2017   HGB 11.6 (L) 12/22/2017   HCT 35.4 12/22/2017   MCV 79.9 (L) 12/22/2017   PLT 345 12/22/2017     STUDIES: Dg Chest 2 View  Result Date: 12/22/2017 CLINICAL DATA:  Medial chest pain and epigastric pain. Nausea and shortness of breath. EXAM: CHEST  2 VIEW COMPARISON:  03/15/2017 FINDINGS: Power port type central venous catheter with tip over the low SVC region. No pneumothorax. Heart size and pulmonary vascularity are normal. Lungs are clear and expanded. No airspace disease or consolidation. No blunting of costophrenic angles. No pneumothorax.  Degenerative changes in the spine. IMPRESSION: No active cardiopulmonary disease. Electronically Signed   By: Lucienne Capers M.D.   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  PLAN:   1. 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 Treatment was then discontinued secondary to worsening peripheral neuropathy. Patient's initial surgery was on July 30, 2017.  She required multiple excisions to obtain clear margins resulting in a full mastectomy with her final surgery occurring on September 28, 2017.  Given the extent of residual disease, it was agreed upon that despite a full mastectomy, she  still should undergo adjuvant XRT.  Patient had consultation with radiation oncology today.  Given the ER/PR status of her tumor she will benefit from either tamoxifen or an aromatase inhibitor for a minimum of 5 years at the conclusion of her XRT.  Return to clinic in 8 weeks for further evaluation and initiation of treatment. 2. Genetic testing: Negative. 3. Left upper and lower abdominal pain: Resolved. Patient had colonoscopy that did not reveal any malignancy, but did reveal an abscess.    4. Anemia: Stable, monitor. 5. Joint pain: Improved. Patient does not complain of pain today.  6. Peripheral neuropathy: Resolved.  Approximately 30 minutes was spent in discussion of which greater than 50% was consultation.  Patient expressed understanding and was in agreement with this plan. She also understands that She can call clinic at any time with any questions, concerns, or complaints.   Cancer Staging Malignant neoplasm of upper-outer quadrant of right breast in female, estrogen receptor positive ([place]) Staging form: Breast, AJCC 8th Edition - Clinical stage from 03/05/2017: Stage IIA (cT2, cN2a, cM0, G2, ER: Positive, PR: Positive, HER2: Negative) - Signed by Lloyd Huger, MD on 08/10/2017 - Pathologic stage from 08/10/2017: No Stage Recommended (ypT1c, pN3a, cM0, G2, ER: Positive, PR: Positive, HER2: Negative) - Signed by Lloyd Huger, MD on 08/10/2017   Lloyd Huger, MD   01/13/2018 9:16 AM

## 2018-01-15 ENCOUNTER — Inpatient Hospital Stay: Payer: Medicaid Other | Admitting: Oncology

## 2018-01-17 ENCOUNTER — Inpatient Hospital Stay: Payer: Medicaid Other | Admitting: Oncology

## 2018-01-27 NOTE — Progress Notes (Signed)
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  INTERVAL HISTORY: Patient returns to clinic today for further evaluation.  She was given multiple appointments at [facility] to initiate radiation oncology, patient reportedly canceled her appointments.  She currently feels well and is asymptomatic. She denies any peripheral neuropathy or other neurologic complaints. She denies any recent fevers or illnesses. She has a good appetite and denies weight loss. She has no chest pain or shortness of breath. She denies any nausea, vomiting, constipation, or diarrhea. She has no urinary complaints. Patient offers no specific complaints today.  REVIEW OF SYSTEMS:   Review of Systems  Constitutional: Negative.  Negative for fever, malaise/fatigue and weight loss.  HENT: Negative.  Negative for congestion.   Respiratory: Negative.  Negative for cough and shortness of breath.   Cardiovascular: Negative.  Negative for chest pain and leg swelling.  Gastrointestinal: Negative.  Negative for abdominal pain, nausea and vomiting.  Genitourinary: Negative.   Skin: Negative.  Negative for rash.  Neurological: Negative.  Negative for sensory change and weakness.  Psychiatric/Behavioral: Negative.  The patient is not nervous/anxious.      As per HPI. Otherwise, a complete review of systems is negative.  PAST MEDICAL HISTORY: Past Medical History:  Diagnosis Date  . Arthritis    SHOULDER  . Cancer ([place]) 02/28/2017   INVASIVE MAMMARY CARCINOMA WITH MUCINOUS FEATURES.  .  Colonic diverticular abscess 06/21/2017   Colonoscopy 06/25/2017: No evidence of malignancy.  . Hypertension   . Irregular heart beat    PT STATES IT "SKIPS A BEAT"   . Obesity   . Personal history of chemotherapy     PAST SURGICAL HISTORY: Past Surgical History:  Procedure Laterality Date  . ANKLE SURGERY    . BREAST BIOPSY Right 02/28/2017   INVASIVE MAMMARY CARCINOMA WITH MUCINOUS FEATURES.  Marland Kitchen BREAST CYST ASPIRATION Right    NEG  . COLONOSCOPY WITH PROPOFOL N/A 06/25/2017   Procedure: COLONOSCOPY WITH PROPOFOL;  Surgeon: Jonathon Bellows, MD;  Location[facility] ENDOSCOPY;  Service: Gastroenterology;  Laterality: N/A;  . MASTECTOMY, PARTIAL Right 07/30/2017   Procedure: MASTECTOMY PARTIAL;  Surgeon: Robert Bellow, MD;  Location: [facility] ORS;  Service: General;  Laterality: Right;  . PORTACATH PLACEMENT Left 03/15/2017   Procedure: INSERTION PORT-A-CATH;  Surgeon: Robert Bellow, MD;  Location: [facility] ORS;  Service: General;  Laterality: Left;  . RE-EXCISION OF BREAST LUMPECTOMY Right 08/17/2017    INVASIVE CARCINOMA EXTENDS TO NEW LATERAL MARGIN. /RE-EXCISION OF BREAST LUMPECTOMY;: Byrnett, Forest Gleason, MD;  [facility] ORS; General;  Laterality: Right;  . SENTINEL NODE BIOPSY Right 07/30/2017   Procedure: SENTINEL NODE BIOPSY;  Surgeon: Robert Bellow, MD;  Location: [facility] ORS;  Service: General;  Laterality: Right;  . SIMPLE MASTECTOMY WITH AXILLARY SENTINEL NODE BIOPSY Right 09/28/2017   Procedure: SIMPLE MASTECTOMY;  Surgeon: Robert Bellow, MD;  Location: [facility] ORS;  Service: General;  Laterality: Right;    FAMILY HISTORY: Family History  Problem Relation Age of Onset  . Diabetes Father   . Stroke Father   . Breast cancer Mother 85  .  Brain cancer Maternal Aunt 60  . Colon cancer Neg Hx     ADVANCED DIRECTIVES (Y/N):  N  HEALTH MAINTENANCE: Social History   Tobacco Use  . Smoking status: Former Smoker    Packs/day: 0.25    Years: 25.00    Pack years: 6.25    Types:  Cigarettes    Last attempt to quit: 10/20/2017    Years since quitting: 0.2  . Smokeless tobacco: Never Used  Substance Use Topics  . Alcohol use: Yes    Comment: occas  . Drug use: No     Colonoscopy:  PAP:  Bone density:  Lipid panel:  Allergies  Allergen Reactions  . Other Hives and Itching    [name]     Current Outpatient Medications  Medication Sig Dispense Refill  . diphenhydrAMINE (BENADRYL) 25 MG tablet Take 1 tablet (25 mg total) by mouth at bedtime as needed for sleep. 30 tablet 1  . Elastic Bandages & Supports (TRUFORM ARM SLEEVE L 20-30MMHG) MISC 1 Device by Does not apply route daily. 1 each 0  . gabapentin (NEURONTIN) 100 MG capsule TAKE 1 CAPSULE BY MOUTH THREE TIMES DAILY OR TAKE 3 CAPSULES AT BEDTIME 270 capsule 3  . triamterene-hydrochlorothiazide (MAXZIDE-25) 37.5-25 MG tablet Take 1 tablet daily by mouth. 90 tablet 1  . letrozole (FEMARA) 2.5 MG tablet Take 1 tablet (2.5 mg total) by mouth daily. 30 tablet 6  . lidocaine-prilocaine (EMLA) cream Apply to affected area once (Patient not taking: Reported on 01/28/2018) 30 g 3   No current facility-administered medications for this visit.     OBJECTIVE: Vitals:   01/28/18 1004  BP: (!) 138/96  Temp: (!) 96.9 F (36.1 C)     Body mass index is 41.82 kg/m.    ECOG FS:0 - Asymptomatic  General: Well-developed, well-nourished, no acute distress. Eyes: Pink conjunctiva, anicteric sclera. Breasts: Right mastectomy. Heart: Regular rate and rhythm. No rubs, murmurs, or gallops. Abdomen: Soft, tender, non-distended. No organomegaly noted, normoactive bowel sounds. Musculoskeletal: No edema, cyanosis, or clubbing. Neuro: Alert, answering all questions appropriately. Cranial nerves grossly intact. Skin: No rashes or petechiae noted. Psych: Normal affect.  LAB RESULTS:  Lab Results  Component Value Date   NA  136 12/22/2017   K 3.3 (L) 12/22/2017   CL 102 12/22/2017   CO2 25 12/22/2017   GLUCOSE 239 (H) 12/22/2017   BUN 16 12/22/2017   CREATININE 1.06 (H) 12/22/2017   CALCIUM 8.8 (L) 12/22/2017   PROT 7.1 09/12/2017   ALBUMIN 3.4 (L) 09/12/2017   AST 21 09/12/2017   ALT 23 09/12/2017   ALKPHOS 95 09/12/2017   BILITOT 0.4 09/12/2017   GFRNONAA >60 12/22/2017   GFRAA >60 12/22/2017    Lab Results  Component Value Date   WBC 8.1 12/22/2017   NEUTROABS 4.1 09/12/2017   HGB 11.6 (L) 12/22/2017   HCT 35.4 12/22/2017   MCV 79.9 (L) 12/22/2017   PLT 345 12/22/2017     STUDIES: No results found.  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  PLAN:   1. 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 Treatment was then discontinued secondary to  worsening peripheral neuropathy. Patient's initial surgery was on July 30, 2017.  She required multiple excisions to obtain clear margins resulting in a full mastectomy with her final surgery occurring on September 28, 2017.  Given the extent of residual disease, it was agreed upon that despite a full mastectomy, she still should undergo adjuvant XRT.  By report, patient had multiple appointments at [facility] for radiation oncology but she canceled the appointments.  Will send a referral back to [facility] for 1 more attempt.  Patient was given a prescription for letrozole today to take daily for 5 years completing in March 2024. Will also get a baseline bone mineral density in the next 1-2 weeks.  Return to clinic in 3 months for further evaluation.   2. Genetic testing: Negative. 3. Left upper and lower abdominal pain: Resolved. Patient had colonoscopy that did not reveal any malignancy, but did reveal an abscess.    4.  Peripheral neuropathy: Resolved.  Approximately 20 minutes was spent in discussion of which greater than 50% was consultation.  Patient expressed understanding and was in agreement with this plan. She also understands that She can call clinic at any time with any questions, concerns, or complaints.   Cancer Staging Malignant neoplasm of upper-outer quadrant of right breast in female, estrogen receptor positive ([place]) Staging form: Breast, AJCC 8th Edition - Clinical stage from 03/05/2017: Stage IIA (cT2, cN2a, cM0, G2, ER: Positive, PR: Positive, HER2: Negative) - Signed by Lloyd Huger, MD on 08/10/2017 - Pathologic stage from 08/10/2017: No Stage Recommended (ypT1c, pN3a, cM0, G2, ER: Positive, PR: Positive, HER2: Negative) - Signed by Lloyd Huger, MD on 08/10/2017   Lloyd Huger, MD   01/28/2018 10:56 AM

## 2018-01-28 ENCOUNTER — Other Ambulatory Visit: Payer: Self-pay

## 2018-01-28 ENCOUNTER — Inpatient Hospital Stay: Payer: Medicaid Other | Attending: Oncology | Admitting: Oncology

## 2018-01-28 VITALS — BP 138/96 | Temp 96.9°F | Wt 259.1 lb

## 2018-01-28 DIAGNOSIS — Z170 Estrogen receptor positive status [ER+]: Secondary | ICD-10-CM | POA: Insufficient documentation

## 2018-01-28 DIAGNOSIS — C50411 Malignant neoplasm of upper-outer quadrant of right female breast: Secondary | ICD-10-CM | POA: Insufficient documentation

## 2018-01-28 MED ORDER — LETROZOLE 2.5 MG PO TABS: 2.5000 mg | ORAL_TABLET | Freq: Every day | ORAL | 6 refills | Status: DC

## 2018-01-28 NOTE — Progress Notes (Signed)
Here for follow up. Stated over all doing well  

## 2018-02-10 ENCOUNTER — Encounter: Payer: Self-pay | Admitting: Nurse Practitioner

## 2018-02-10 NOTE — Progress Notes (Signed)
Subjective:    Patient ID: Chelsea Hudson, female    DOB: [birth year], 52 y.o.   MRN: [id number]  Chelsea Hudson is a 52 y.o. female presenting on 01/02/2018 for Hypertension   HPI Hypertension  - She is checking BP at home or outside of clinic.  Readings 120s/70s - Current medications: triamterene-hydrochlorothiazide 37.5-25 mg once daily, tolerating well without side effects.   - She is not currently symptomatic. - Pt denies headache, lightheadedness, dizziness, changes in vision, chest tightness/pressure, palpitations, leg swelling, sudden loss of speech or loss of consciousness. - She  reports no regular exercise routine. - Her diet is high in salt, high in fat, and high in carbohydrates.  Tries to eat breakfast: biscuit - [employer] (3-4 times per week) Usually lunch: Leftovers from home - Chicken and potatoes or vegetables, steak and salad,  Occasionally dinner: same as lunch - eats dinner 2x per week.  Doesn't want to eat late at night and go right to bed. - Occasionally has afternoon/evening snack.  Pelvic pressure Pt reports increased pelvic pressure, dysuria, and foul smelling urine.  She feels these symptoms are the same as her last UTI and would like to have [place] check her urine today. - Denies back/flank pain, blood in urine, vaginal discharge, fever, chills, and sweats.  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  She requests refill today.  Social History   Tobacco Use  . Smoking status: Former Smoker    Packs/day: 0.25    Years: 25.00    Pack years: 6.25    Types: Cigarettes    Last attempt to quit: 10/20/2017    Years since quitting: 0.3  . Smokeless tobacco: Never Used  Substance Use Topics  . Alcohol use: Yes    Comment: occas  . Drug use: No    Review of Systems Per HPI unless specifically indicated above     Objective:    BP 126/73 (BP Location: Right Arm,  Patient Position: Sitting, Cuff Size: Large)   Pulse 88   Temp 98.3 F (36.8 C) (Oral)   Ht 5\' 6"  (1.676 m)   Wt 256 lb 6.4 oz (116.3 kg)   LMP 01/31/2014 (Approximate) Comment: LMP was 3 years ago.  BMI 41.38 kg/m   Wt Readings from Last 3 Encounters:  01/28/18 259 lb 1.6 oz (117.5 kg)  01/02/18 256 lb 6.4 oz (116.3 kg)  12/22/17 259 lb (117.5 kg)    Physical Exam  General - morbidly obese, well-appearing, NAD HEENT - Normocephalic, atraumatic Neck - supple, non-tender, no LAD, no thyromegaly, no carotid bruit Heart - RRR, no murmurs heard Lungs - Clear throughout all lobes, no wheezing, crackles, or rhonchi. Normal work of breathing. Abdomen - soft, NTND, no masses, no hepatosplenomegaly, active bowel sounds  Extremeties - non-tender, no edema, cap refill < 2 seconds, peripheral pulses intact +2 bilaterally Skin - warm, dry Neuro - awake, alert, oriented x3, normal gait Psych - Normal mood and affect, normal behavior    Results for orders placed or performed in visit on 01/02/18  Urine Culture  Result Value Ref Range   MICRO NUMBER: [id number]    SPECIMEN QUALITY: ADEQUATE    Sample Source NOT GIVEN    STATUS: FINAL    Result: No Growth   Lipid panel  Result Value Ref Range   Cholesterol, Total 191 100 - 199 mg/dL   Triglycerides 119 0 - 149 mg/dL  HDL 37 (L) >39 mg/dL   VLDL Cholesterol Cal 24 5 - 40 mg/dL   LDL Calculated 130 (H) 0 - 99 mg/dL   Chol/HDL Ratio 5.2 (H) 0.0 - 4.4 ratio  Hemoglobin A1c  Result Value Ref Range   Hgb A1c MFr Bld 8.6 (H) 4.8 - 5.6 %   Est. average glucose Bld gHb Est-mCnc 200 mg/dL  POCT Urinalysis Dipstick  Result Value Ref Range   Color, UA yellow    Clarity, UA clear    Glucose, UA neg    Bilirubin, UA neg    Ketones, UA neg    Spec Grav, UA 1.020 1.010 - 1.025   Blood, UA neg    pH, UA 5.0 5.0 - 8.0   Protein, UA neg    Urobilinogen, UA 0.2 0.2 or 1.0 E.U./dL   Nitrite, UA neg    Leukocytes, UA Negative Negative    Appearance clear    Odor        Assessment & Plan:   Problem List Items Addressed This Visit      Cardiovascular and Mediastinum   Essential hypertension Well-controlled hypertension.  BP goal < 130/80.  Pt is not working on lifestyle modifications.  Taking medications tolerating well without side effects. No currently identified complications.  Plan: 1. Continue taking medications without changes 2. Obtain labs CMP, Lipid  3. Encouraged heart healthy diet and increasing exercise to 30 minutes most days of the week. 4. Check BP 1-2 x per week at home, keep log, and bring to clinic at next appointment. 5. Follow up 3 months.      Other Visit Diagnoses    Aortic atherosclerosis ([place])    -  Primary Pt with identified aortic atherosclerosis on imaging scans.  Prior lipid panel > 3 months ago.    Plan: 1. Recheck fasting lipid panel. 2. Start statin for ASCVD prevention if LDL not at goal 3. Followup after labs.   Relevant Orders   Lipid panel (Completed)   Acute cystitis without hematuria     Acute cystitis without hematuria.  Pt symptomatic currently with increased suprapubic pressure. Currently without systemic signs or symptoms of infection.   - No current risk of concurrent STI.  Plan: 1. Await urine culture and UA before treatment is provided. 2. Provided non-pharm measures for UTI prevention for good hygiene. 3. Drink plenty of fluids and improve hydration over next 1 week. 4. Provided precautions for severe symptoms requiring ED visit to include no urine in 24-48 hours. 5. Followup 2-5 days as needed for worsening or persistent symptoms.     Relevant Orders   Urine Culture (Completed)   POCT Urinalysis Dipstick (Completed)   Elevated glucose     Monitor elevated glucose with A1c.  No recent A1c for DM screening.     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  Plan: 1. Continue gabapentin without changes. 2. Followup annually and continue followup with oncologist.       Follow up plan: Return in about 3 months (around 04/01/2018) for Hypertension, obesity.  Cassell Smiles, DNP, AGPCNP-BC Adult Gerontology Primary Care Nurse Practitioner [facility] 02/10/2018, 8:34 PM

## 2018-02-20 ENCOUNTER — Other Ambulatory Visit: Payer: Medicaid Other

## 2018-03-01 DIAGNOSIS — K5730 Diverticulosis of large intestine without perforation or abscess without bleeding: Secondary | ICD-10-CM

## 2018-03-12 ENCOUNTER — Ambulatory Visit: Payer: Medicaid Other | Admitting: General Surgery

## 2018-03-14 ENCOUNTER — Ambulatory Visit: Payer: Medicaid Other | Admitting: General Surgery

## 2018-04-04 ENCOUNTER — Other Ambulatory Visit: Payer: Self-pay

## 2018-04-04 ENCOUNTER — Encounter: Payer: Self-pay | Admitting: Nurse Practitioner

## 2018-04-04 ENCOUNTER — Ambulatory Visit: Payer: Medicaid Other | Admitting: Nurse Practitioner

## 2018-04-04 DIAGNOSIS — I10 Essential (primary) hypertension: Secondary | ICD-10-CM | POA: Diagnosis not present

## 2018-04-04 DIAGNOSIS — G620 Drug-induced polyneuropathy: Secondary | ICD-10-CM

## 2018-04-04 DIAGNOSIS — T451X5A Adverse effect of antineoplastic and immunosuppressive drugs, initial encounter: Principal | ICD-10-CM

## 2018-04-04 MED ORDER — TRIAMTERENE-HCTZ 37.5-25 MG PO TABS: 1.0000 | ORAL_TABLET | Freq: Every day | ORAL | 1 refills | Status: DC

## 2018-04-04 MED ORDER — GABAPENTIN 300 MG PO CAPS: ORAL_CAPSULE | ORAL | 3 refills | Status: DC

## 2018-04-04 MED ORDER — GABAPENTIN 400 MG PO CAPS: 400.0000 mg | ORAL_CAPSULE | Freq: Every day | ORAL | 3 refills | Status: DC

## 2018-04-04 NOTE — Progress Notes (Signed)
Subjective:    Patient ID: Chelsea Hudson, female    DOB: 1966-07-27, 52 y.o.   MRN: [id number]  Chelsea Hudson is a 52 y.o. female presenting on 04/04/2018 for Hypertension   HPI Hypertension - She is checking BP at home or outside of clinic.  Readings have been consistently stable. - Current medications: triamterene-HCTZ 37.5-25, tolerating well without side effects - She is not currently symptomatic. - Pt denies headache, lightheadedness, dizziness, changes in vision, chest tightness/pressure, palpitations, leg swelling, sudden loss of speech or loss of consciousness. - She  reports no regular exercise routine. - Her diet is moderate in salt, moderate in fat, and moderate in carbohydrates.   Chemotherapy induced peripheral neuropathy Patient is stable on gabapentin 300 mg daily, but is not continuing to have sufficient relief.  No significant pain, but patient continues to have tingling of fingers that impairs fine motor activities regularly.  Social History   Tobacco Use  . Smoking status: Former Smoker    Packs/day: 0.25    Years: 25.00    Pack years: 6.25    Types: Cigarettes    Last attempt to quit: 10/20/2017    Years since quitting: 0.4  . Smokeless tobacco: Never Used  Substance Use Topics  . Alcohol use: Yes    Comment: occas  . Drug use: No    Review of Systems Per HPI unless specifically indicated above     Objective:    BP 112/75 (BP Location: Right Arm, Patient Position: Sitting, Cuff Size: Large)   Pulse 98   Temp 98.3 F (36.8 C) (Oral)   Ht 5\' 6"  (1.676 m)   Wt 258 lb 9.6 oz (117.3 kg)   LMP 01/31/2014 (Approximate) Comment: LMP was 3 years ago.  BMI 41.74 kg/m   Wt Readings from Last 3 Encounters:  04/04/18 258 lb 9.6 oz (117.3 kg)  01/28/18 259 lb 1.6 oz (117.5 kg)  01/02/18 256 lb 6.4 oz (116.3 kg)    Physical Exam  Constitutional: She is oriented to person, place, and time. She appears well-developed and well-nourished. No distress.    HENT:  Head: Normocephalic and atraumatic.  Neck: Normal range of motion. Neck supple. Carotid bruit is not present.  Cardiovascular: Normal rate, regular rhythm, S1 normal, S2 normal, normal heart sounds and intact distal pulses.  Pulmonary/Chest: Effort normal and breath sounds normal. No respiratory distress.  Musculoskeletal: She exhibits no edema (pedal).  Neurological: She is alert and oriented to person, place, and time.  Skin: Skin is warm and dry. Capillary refill takes less than 2 seconds.  Psychiatric: She has a normal mood and affect. Her behavior is normal. Judgment and thought content normal.  Vitals reviewed.   Results for orders placed or performed in visit on 01/02/18  Urine Culture  Result Value Ref Range   MICRO NUMBER: [id number]    SPECIMEN QUALITY: ADEQUATE    Sample Source NOT GIVEN    STATUS: FINAL    Result: No Growth   Lipid panel  Result Value Ref Range   Cholesterol, Total 191 100 - 199 mg/dL   Triglycerides 119 0 - 149 mg/dL   HDL 37 (L) >39 mg/dL   VLDL Cholesterol Cal 24 5 - 40 mg/dL   LDL Calculated 130 (H) 0 - 99 mg/dL   Chol/HDL Ratio 5.2 (H) 0.0 - 4.4 ratio  Hemoglobin A1c  Result Value Ref Range   Hgb A1c MFr Bld 8.6 (H) 4.8 - 5.6 %   Est. average  glucose Bld gHb Est-mCnc 200 mg/dL  POCT Urinalysis Dipstick  Result Value Ref Range   Color, UA yellow    Clarity, UA clear    Glucose, UA neg    Bilirubin, UA neg    Ketones, UA neg    Spec Grav, UA 1.020 1.010 - 1.025   Blood, UA neg    pH, UA 5.0 5.0 - 8.0   Protein, UA neg    Urobilinogen, UA 0.2 0.2 or 1.0 E.U./dL   Nitrite, UA neg    Leukocytes, UA Negative Negative   Appearance clear    Odor        Assessment & Plan:   Problem List Items Addressed This Visit      Cardiovascular and Mediastinum   Essential hypertension Stable today on exam.  Medications tolerated without side effects.  Continue at current doses.  Refills provided.  Check labs today. Followup 6 months.     Relevant Medications   triamterene-hydrochlorothiazide (MAXZIDE-25) 37.5-25 MG tablet    Other Visit Diagnoses    Neuropathy due to chemotherapeutic drug ([place])     Stable, but suboptimally controlled.  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 - Increase gabapentin to 400 mg q hs for peripheral neuropathy. - follow-up 6 months and with oncology   Relevant Medications   gabapentin (NEURONTIN) 400 MG capsule      Meds ordered this encounter  Medications  . triamterene-hydrochlorothiazide (MAXZIDE-25) 37.5-25 MG tablet    Sig: Take 1 tablet by mouth daily.    Dispense:  90 tablet    Refill:  1  . DISCONTD: gabapentin (NEURONTIN) 300 MG capsule    Sig: TAKE 1 CAPSULE BY MOUTH THREE TIMES DAILY OR TAKE 3 CAPSULES AT BEDTIME    Dispense:  90 capsule    Refill:  3    Please consider 90 day supplies to promote better adherence    Order Specific Question:   Supervising Provider    Answer:   Olin Hauser [[id number]]  . gabapentin (NEURONTIN) 400 MG capsule    Sig: Take 1 capsule (400 mg total) by mouth at bedtime.    Dispense:  90 capsule    Refill:  3    PLEASE note dose.  Cancel 300 mg capsule prescription.  Dose increase/change is intended.    Order Specific Question:   Supervising Provider    Answer:   Olin Hauser [[id number]]    Follow up plan: Return in about 6 months (around 10/05/2018) for hypertension.  Cassell Smiles, DNP, AGPCNP-BC Adult Gerontology Primary Care Nurse Practitioner [facility] 04/04/2018, 11:31 AM

## 2018-04-04 NOTE — Patient Instructions (Addendum)
Chelsea Hudson,   Thank you for coming in to clinic today.  1. Continue oncology care.  2. No changes to blood pressure meds today.  3. INCREASE gabapentin to 400 mg at bedtime.  Please schedule a follow-up appointment with Cassell Smiles, AGNP. Return in about 6 months (around 10/05/2018) for hypertension.  If you have any other questions or concerns, please feel free to call the clinic or send a message through [place]. You may also schedule an earlier appointment if necessary.  You will receive a survey after today's visit either digitally by e-mail or paper by [employer]. Your experiences and feedback matter to [place].  Please respond so we know how we are doing as we provide care for you.   Cassell Smiles, DNP, AGNP-BC Adult Gerontology Nurse Practitioner [place]

## 2018-04-07 NOTE — Progress Notes (Signed)
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  INTERVAL HISTORY: Patient returns to clinic today for further evaluation.  She was given multiple appointments at [facility] to initiate radiation oncology, patient reportedly canceled her appointments.  She currently feels well and is asymptomatic. She denies any peripheral neuropathy or other neurologic complaints. She denies any recent fevers or illnesses. She has a good appetite and denies weight loss. She has no chest pain or shortness of breath. She denies any nausea, vomiting, constipation, or diarrhea. She has no urinary complaints. Patient offers no specific complaints today.  REVIEW OF SYSTEMS:   Review of Systems  Constitutional: Negative.  Negative for fever, malaise/fatigue and weight loss.  HENT: Negative.  Negative for congestion.   Respiratory: Negative.  Negative for cough and shortness of breath.   Cardiovascular: Negative.  Negative for chest pain and leg swelling.  Gastrointestinal: Negative.  Negative for abdominal pain, nausea and vomiting.  Genitourinary: Negative.   Skin: Negative.  Negative for rash.  Neurological: Negative.  Negative for sensory change and weakness.  Psychiatric/Behavioral: Negative.  The patient is not nervous/anxious.      As per HPI. Otherwise, a complete review of systems is negative.  PAST MEDICAL HISTORY: Past Medical History:  Diagnosis Date  . Arthritis    SHOULDER  . Cancer ([place]) 02/28/2017   INVASIVE MAMMARY CARCINOMA WITH MUCINOUS FEATURES.  .  Colonic diverticular abscess 06/21/2017   Colonoscopy 06/25/2017: No evidence of malignancy.  . Hypertension   . Irregular heart beat    PT STATES IT "SKIPS A BEAT"   . Obesity   . Personal history of chemotherapy     PAST SURGICAL HISTORY: Past Surgical History:  Procedure Laterality Date  . ANKLE SURGERY    . BREAST BIOPSY Right 02/28/2017   INVASIVE MAMMARY CARCINOMA WITH MUCINOUS FEATURES.  Marland Kitchen BREAST CYST ASPIRATION Right    NEG  . COLONOSCOPY WITH PROPOFOL N/A 06/25/2017   Procedure: COLONOSCOPY WITH PROPOFOL;  Surgeon: Jonathon Bellows, MD;  Location: [facility] ENDOSCOPY;  Service: Gastroenterology;  Laterality: N/A;  . MASTECTOMY, PARTIAL Right 07/30/2017   Procedure: MASTECTOMY PARTIAL;  Surgeon: Robert Bellow, MD;  Location: [facility] ORS;  Service: General;  Laterality: Right;  . PORTACATH PLACEMENT Left 03/15/2017   Procedure: INSERTION PORT-A-CATH;  Surgeon: Robert Bellow, MD;  Location: [facility] ORS;  Service: General;  Laterality: Left;  . RE-EXCISION OF BREAST LUMPECTOMY Right 08/17/2017    INVASIVE CARCINOMA EXTENDS TO NEW LATERAL MARGIN. /RE-EXCISION OF BREAST LUMPECTOMY;: Byrnett, Forest Gleason, MD;  [facility] ORS; General;  Laterality: Right;  . SENTINEL NODE BIOPSY Right 07/30/2017   Procedure: SENTINEL NODE BIOPSY;  Surgeon: Robert Bellow, MD;  Location: [facility] ORS;  Service: General;  Laterality: Right;  . SIMPLE MASTECTOMY WITH AXILLARY SENTINEL NODE BIOPSY Right 09/28/2017   Procedure: SIMPLE MASTECTOMY;  Surgeon: Robert Bellow, MD;  Location: [facility] ORS;  Service: General;  Laterality: Right;    FAMILY HISTORY: Family History  Problem Relation Age of Onset  . Diabetes Father   . Stroke Father   . Breast cancer Mother 34  .  Brain cancer Maternal Aunt 60  . Colon cancer Neg Hx     ADVANCED DIRECTIVES (Y/N):  N  HEALTH MAINTENANCE: Social History   Tobacco Use  . Smoking status: Former Smoker    Packs/day: 0.25    Years: 25.00    Pack years: 6.25    Types:  Cigarettes    Last attempt to quit: 10/20/2017    Years since quitting: 0.4  . Smokeless tobacco: Never Used  Substance Use Topics  . Alcohol use: Yes    Comment: occas  . Drug use: No     Colonoscopy:  PAP:  Bone density:  Lipid panel:  Allergies  Allergen Reactions  . Other Hives and Itching    [name]     Current Outpatient Medications  Medication Sig Dispense Refill  . diphenhydrAMINE (BENADRYL) 25 MG tablet Take 1 tablet (25 mg total) by mouth at bedtime as needed for sleep. 30 tablet 1  . gabapentin (NEURONTIN) 400 MG capsule Take 1 capsule (400 mg total) by mouth at bedtime. 90 capsule 3  . triamterene-hydrochlorothiazide (MAXZIDE-25) 37.5-25 MG tablet Take 1 tablet by mouth daily. 90 tablet 1  . Elastic Bandages & Supports (TRUFORM ARM SLEEVE L 20-30MMHG) MISC 1 Device by Does not apply route daily. (Patient not taking: Reported on 04/08/2018) 1 each 0  . letrozole (FEMARA) 2.5 MG tablet Take 1 tablet (2.5 mg total) by mouth daily. (Patient not taking: Reported on 04/04/2018) 30 tablet 6  . lidocaine-prilocaine (EMLA) cream Apply to affected area once (Patient not taking: Reported on 04/08/2018) 30 g 3  . oxyCODONE (OXY IR/ROXICODONE) 5 MG immediate release tablet   0   No current facility-administered medications for this visit.     OBJECTIVE: Vitals:   04/08/18 1435  BP: 135/90  Pulse: 84  Resp: 18  Temp: 98.2 F (36.8 C)     Body mass index is 42.22 kg/m.    ECOG FS:0 - Asymptomatic  General: Well-developed, well-nourished, no acute distress. Eyes: Pink conjunctiva, anicteric sclera. Breasts: Right mastectomy. Heart: Regular rate and rhythm. No rubs, murmurs, or gallops. Abdomen: Soft, tender, non-distended. No organomegaly noted, normoactive bowel sounds. Musculoskeletal: No edema, cyanosis, or clubbing. Neuro: Alert, answering all questions appropriately.  Cranial nerves grossly intact. Skin: No rashes or petechiae noted. Psych: Normal affect.  LAB RESULTS:  Lab Results  Component Value Date   NA 136 12/22/2017   K 3.3 (L) 12/22/2017   CL 102 12/22/2017   CO2 25 12/22/2017   GLUCOSE 239 (H) 12/22/2017   BUN 16 12/22/2017   CREATININE 1.06 (H) 12/22/2017   CALCIUM 8.8 (L) 12/22/2017   PROT 7.1 09/12/2017   ALBUMIN 3.4 (L) 09/12/2017   AST 21 09/12/2017   ALT 23 09/12/2017   ALKPHOS 95 09/12/2017   BILITOT 0.4 09/12/2017   GFRNONAA >60 12/22/2017   GFRAA >60 12/22/2017    Lab Results  Component Value Date   WBC 8.1 12/22/2017   NEUTROABS 4.1 09/12/2017   HGB 11.6 (L) 12/22/2017   HCT 35.4 12/22/2017   MCV 79.9 (L) 12/22/2017   PLT 345 12/22/2017     STUDIES: No results found.  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  PLAN:   1. Pathologic stage T3 N3a M0,  ER/PR positive, HER-2 negative invasive carcinoma of the  upper outer quadrant of the right breast: Patient completed cycle 7 of 12 of neoadjuvant Taxol on June 27, 2017. Treatment was discontinued secondary to worsening peripheral neuropathy. Patient's initial surgery was on July 30, 2017.  She required multiple excisions to obtain clear margins resulting in a full mastectomy with her final surgery occurring on September 28, 2017.  Most recently she underwent complete axillary node dissection on March 04, 2018.  She had multiple complications postoperatively with an infected seroma as well as sepsis.  She has yet to initiate XRT, but has simulation this week and plans to start treatment within 1 to 2 weeks.  Patient was previously given a prescription for letrozole, but is unclear of her compliance.  Return to clinic in mid July at the end of her XRT for further evaluation and additional discussion of her aromatase inhibitor treatment.    2. Genetic testing:  Negative. 3. Left upper and lower abdominal pain: Resolved. Patient had colonoscopy that did not reveal any malignancy, but did reveal an abscess.    4. Peripheral neuropathy: Resolved.  Approximately 30 minutes was spent in discussion of which greater than 50% was consultation.  Patient expressed understanding and was in agreement with this plan. She also understands that She can call clinic at any time with any questions, concerns, or complaints.   Cancer Staging Malignant neoplasm of upper-outer quadrant of right breast in female, estrogen receptor positive ([place]) Staging form: Breast, AJCC 8th Edition - Clinical stage from 03/05/2017: Stage IIA (cT2, cN2a, cM0, G2, ER: Positive, PR: Positive, HER2: Negative) - Signed by Lloyd Huger, MD on 08/10/2017 - Pathologic stage from 08/10/2017: No Stage Recommended (ypT3, pN3a, cM0, G2, ER+, PR+, HER2-) - Signed by Lloyd Huger, MD on 04/10/2018   Lloyd Huger, MD   04/10/2018 1:20 PM

## 2018-04-08 ENCOUNTER — Inpatient Hospital Stay: Payer: Medicaid Other | Attending: Oncology | Admitting: Oncology

## 2018-04-08 ENCOUNTER — Other Ambulatory Visit: Payer: Self-pay

## 2018-04-08 VITALS — BP 135/90 | HR 84 | Temp 98.2°F | Resp 18 | Wt 261.6 lb

## 2018-04-08 DIAGNOSIS — C50411 Malignant neoplasm of upper-outer quadrant of right female breast: Secondary | ICD-10-CM

## 2018-04-08 DIAGNOSIS — Z170 Estrogen receptor positive status [ER+]: Secondary | ICD-10-CM | POA: Insufficient documentation

## 2018-04-08 NOTE — Progress Notes (Signed)
Pt here for follow up. Per pt was having radiation at [facility] -did repeat CT scan -found cancer in R sided arm pit area lymph node area - 2 removed and left w drainx 2 then got infected after removal .-recently admitted to [facility] x 3 days -there may 3-6 th.  Here for follow up. Per pt denies pain ,and doing " much better "

## 2018-04-30 ENCOUNTER — Ambulatory Visit: Payer: Medicaid Other | Admitting: Oncology

## 2018-05-19 ENCOUNTER — Encounter: Payer: Self-pay | Admitting: Nurse Practitioner

## 2018-05-21 ENCOUNTER — Ambulatory Visit: Payer: Medicaid Other | Admitting: Nurse Practitioner

## 2018-05-24 ENCOUNTER — Encounter: Payer: Self-pay | Admitting: Nurse Practitioner

## 2018-05-24 ENCOUNTER — Ambulatory Visit: Payer: Medicaid Other | Admitting: Nurse Practitioner

## 2018-05-24 ENCOUNTER — Other Ambulatory Visit: Payer: Self-pay

## 2018-05-24 VITALS — BP 137/79 | HR 93 | Resp 16 | Ht 66.0 in | Wt 258.0 lb

## 2018-05-24 DIAGNOSIS — E1169 Type 2 diabetes mellitus with other specified complication: Secondary | ICD-10-CM

## 2018-05-24 DIAGNOSIS — E785 Hyperlipidemia, unspecified: Secondary | ICD-10-CM | POA: Diagnosis not present

## 2018-05-24 DIAGNOSIS — E1165 Type 2 diabetes mellitus with hyperglycemia: Secondary | ICD-10-CM

## 2018-05-24 LAB — POCT GLYCOSYLATED HEMOGLOBIN (HGB A1C): Hemoglobin A1C: 9 % — AB (ref 4.0–5.6)

## 2018-05-24 MED ORDER — METFORMIN HCL 500 MG PO TABS: 500.0000 mg | ORAL_TABLET | Freq: Two times a day (BID) | ORAL | 2 refills | Status: DC

## 2018-05-24 MED ORDER — ATORVASTATIN CALCIUM 20 MG PO TABS: 20.0000 mg | ORAL_TABLET | Freq: Every day | ORAL | 2 refills | Status: DC

## 2018-05-24 MED ORDER — BLOOD GLUCOSE METER KIT: PACK | 0 refills | Status: DC

## 2018-05-24 NOTE — Progress Notes (Signed)
Subjective:    Patient ID: Chelsea Hudson, female    DOB: 1966/02/02, 52 y.o.   MRN: [id number]  Chelsea Hudson is a 52 y.o. female presenting on 05/24/2018 for Diabetes   HPI Diabetes Pt presents today for follow up of Type 2 diabetes mellitus. She is not checking CBG at home - Current diabetic medications include: none - She is not currently symptomatic.  - Clinical course has been worsening.  Patient notes dietary indiscretions, little exercise 2/2 fatigue. - Weight trend: stable  Recent Labs    01/02/18 1323 05/24/18 0948  HGBA1C 8.6* 9.0*     Social History   Tobacco Use  . Smoking status: Former Smoker    Packs/day: 0.25    Years: 25.00    Pack years: 6.25    Types: Cigarettes    Last attempt to quit: 10/20/2017    Years since quitting: 0.5  . Smokeless tobacco: Never Used  Substance Use Topics  . Alcohol use: Yes    Comment: occas  . Drug use: No    Review of Systems Per HPI unless specifically indicated above     Objective:    BP 137/79   Pulse 93   Resp 16   Ht 5\' 6"  (1.676 m)   Wt 258 lb (117 kg)   LMP 01/31/2014 (Approximate) Comment: LMP was 3 years ago.  SpO2 100%   BMI 41.64 kg/m   Wt Readings from Last 3 Encounters:  05/24/18 258 lb (117 kg)  04/08/18 261 lb 9.6 oz (118.7 kg)  04/04/18 258 lb 9.6 oz (117.3 kg)    Physical Exam  Constitutional: She is oriented to person, place, and time. She appears well-developed and well-nourished. No distress (although, appears fatigued).  HENT:  Head: Normocephalic and atraumatic.  Cardiovascular: Normal rate, regular rhythm, S1 normal, S2 normal, normal heart sounds and intact distal pulses.  Pulmonary/Chest: Effort normal and breath sounds normal. No respiratory distress.  Neurological: She is alert and oriented to person, place, and time.  Skin: Skin is warm and dry.  Psychiatric: She has a normal mood and affect. Her behavior is normal.  Vitals reviewed.    Results for orders placed or  performed in visit on 05/24/18  POCT HgB A1C  Result Value Ref Range   Hemoglobin A1C 9.0 (A) 4.0 - 5.6 %   HbA1c POC (<> result, manual entry)  4.0 - 5.6 %   HbA1c, POC (prediabetic range)  5.7 - 6.4 %   HbA1c, POC (controlled diabetic range)  0.0 - 7.0 %      Assessment & Plan:   Problem List Items Addressed This Visit    None    Visit Diagnoses    DM type 2 without retinopathy ([place])    -  Primary   Relevant Orders   POCT HgB A1C (Completed)   Hyperlipidemia associated with type 2 diabetes mellitus ([place])        UncontrolledDM with A1c 9.0 Worsening control from prior and goal A1c < 7.0%. - Complications - Hyperlipidemia.  Plan:  1. Change therapy:  - START metformin 500 mg once daily with meals for 14 days, 500 mg bidwc and continue.   2. Encourage improved lifestyle: - low carb/low glycemic diet handout provided - Increase physical activity to 30 minutes most days of the week.  Explained that increased physical activity increases body's use of sugar for energy. 3. Check fasting am CBG.  Bring log to next visit for review 4. Start atorvastatin  20 mg once daily.   5. Follow-up 6 weeks (no a1c, bring log)    Meds ordered this encounter  Medications  . metFORMIN (GLUCOPHAGE) 500 MG tablet    Sig: Take 1 tablet (500 mg total) by mouth 2 (two) times daily with a meal.    Dispense:  60 tablet    Refill:  2    Order Specific Question:   Supervising Provider    Answer:   Olin Hauser [[id number]]  . atorvastatin (LIPITOR) 20 MG tablet    Sig: Take 1 tablet (20 mg total) by mouth daily.    Dispense:  30 tablet    Refill:  2    Order Specific Question:   Supervising Provider    Answer:   Olin Hauser [[id number]]    Follow up plan: Return in about 6 weeks (around 07/05/2018) for diabetes (no A1c).  Cassell Smiles, DNP, AGPCNP-BC Adult Gerontology Primary Care Nurse Practitioner [facility] 05/24/2018, 9:51 AM

## 2018-05-24 NOTE — Patient Instructions (Addendum)
Chelsea Hudson,   Thank you for coming in to clinic today.  1. Start metformin 500 mg bid.  Start w/ 500 mg one tablet once daily with breakfast or supper for 2 weeks, then increase to 500 mg twice daily with breakfast and dinner.  Discussed common side effects to include diarrhea, gas.  2. START atorvastatin 20 mg once daily with supper.  3. Eat a low glycemic diet (handout provided)  4. Increase your physical activity until you are increasing your heart rate for 30 minutes on most days of the week.  Please schedule a follow-up appointment with Cassell Smiles, AGNP. Return in about 6 weeks (around 07/05/2018) for diabetes (no A1c).   If you have any other questions or concerns, please feel free to call the clinic or send a message through [facility]. You may also schedule an earlier appointment if necessary.  You will receive a survey after today's visit either digitally by e-mail or paper by [employer]. Your experiences and feedback matter to [place].  Please respond so we know how we are doing as we provide care for you.   Cassell Smiles, DNP, AGNP-BC Adult Gerontology Nurse Practitioner [place]

## 2018-06-02 NOTE — Progress Notes (Deleted)
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  INTERVAL HISTORY: Patient returns to clinic today for further evaluation.  She was given multiple appointments at [facility] to initiate radiation oncology, patient reportedly canceled her appointments.  She currently feels well and is asymptomatic. She denies any peripheral neuropathy or other neurologic complaints. She denies any recent fevers or illnesses. She has a good appetite and denies weight loss. She has no chest pain or shortness of breath. She denies any nausea, vomiting, constipation, or diarrhea. She has no urinary complaints. Patient offers no specific complaints today.  REVIEW OF SYSTEMS:   Review of Systems  Constitutional: Negative.  Negative for fever, malaise/fatigue and weight loss.  HENT: Negative.  Negative for congestion.   Respiratory: Negative.  Negative for cough and shortness of breath.   Cardiovascular: Negative.  Negative for chest pain and leg swelling.  Gastrointestinal: Negative.  Negative for abdominal pain, nausea and vomiting.  Genitourinary: Negative.   Skin: Negative.  Negative for rash.  Neurological: Negative.  Negative for sensory change and weakness.  Psychiatric/Behavioral: Negative.  The patient is not nervous/anxious.      As per HPI. Otherwise, a complete review of systems is negative.  PAST MEDICAL HISTORY: Past Medical History:  Diagnosis Date  . Arthritis    SHOULDER  . Cancer ([place]) 02/28/2017   INVASIVE MAMMARY CARCINOMA WITH MUCINOUS FEATURES.  .  Colonic diverticular abscess 06/21/2017   Colonoscopy 06/25/2017: No evidence of malignancy.  . Hypertension   . Irregular heart beat    PT STATES IT "SKIPS A BEAT"   . Obesity   . Personal history of chemotherapy     PAST SURGICAL HISTORY: Past Surgical History:  Procedure Laterality Date  . ANKLE SURGERY    . BREAST BIOPSY Right 02/28/2017   INVASIVE MAMMARY CARCINOMA WITH MUCINOUS FEATURES.  Marland Kitchen BREAST CYST ASPIRATION Right    NEG  . COLONOSCOPY WITH PROPOFOL N/A 06/25/2017   Procedure: COLONOSCOPY WITH PROPOFOL;  Surgeon: Jonathon Bellows, MD;  Location: [facility] ENDOSCOPY;  Service: Gastroenterology;  Laterality: N/A;  . MASTECTOMY, PARTIAL Right 07/30/2017   Procedure: MASTECTOMY PARTIAL;  Surgeon: Robert Bellow, MD;  Location: [facility] ORS;  Service: General;  Laterality: Right;  . PORTACATH PLACEMENT Left 03/15/2017   Procedure: INSERTION PORT-A-CATH;  Surgeon: Robert Bellow, MD;  Location: [facility] ORS;  Service: General;  Laterality: Left;  . RE-EXCISION OF BREAST LUMPECTOMY Right 08/17/2017    INVASIVE CARCINOMA EXTENDS TO NEW LATERAL MARGIN. /RE-EXCISION OF BREAST LUMPECTOMY;: Byrnett, Forest Gleason, MD;  [facility] ORS; General;  Laterality: Right;  . SENTINEL NODE BIOPSY Right 07/30/2017   Procedure: SENTINEL NODE BIOPSY;  Surgeon: Robert Bellow, MD;  Location: [facility] ORS;  Service: General;  Laterality: Right;  . SIMPLE MASTECTOMY WITH AXILLARY SENTINEL NODE BIOPSY Right 09/28/2017   Procedure: SIMPLE MASTECTOMY;  Surgeon: Robert Bellow, MD;  Location: [facility] ORS;  Service: General;  Laterality: Right;    FAMILY HISTORY: Family History  Problem Relation Age of Onset  . Diabetes Father   . Stroke Father   . Breast cancer Mother 83  .  Brain cancer Maternal Aunt 60  . Colon cancer Neg Hx     ADVANCED DIRECTIVES (Y/N):  N  HEALTH MAINTENANCE: Social History   Tobacco Use  . Smoking status: Former Smoker    Packs/day: 0.25    Years: 25.00    Pack years: 6.25    Types:  Cigarettes    Last attempt to quit: 10/20/2017    Years since quitting: 0.6  . Smokeless tobacco: Never Used  Substance Use Topics  . Alcohol use: Yes    Comment: occas  . Drug use: No     Colonoscopy:  PAP:  Bone density:  Lipid panel:  Allergies  Allergen Reactions  . Other Hives and Itching    [name]     Current Outpatient Medications  Medication Sig Dispense Refill  . atorvastatin (LIPITOR) 20 MG tablet Take 1 tablet (20 mg total) by mouth daily. 30 tablet 2  . blood glucose meter kit and supplies Dispense based on patient and insurance preference. Use up to one time daily as directed. (FOR ICD-10 E10.9, E11.9). 1 each 0  . gabapentin (NEURONTIN) 400 MG capsule Take 1 capsule (400 mg total) by mouth at bedtime. 90 capsule 3  . letrozole (FEMARA) 2.5 MG tablet Take 1 tablet (2.5 mg total) by mouth daily. 30 tablet 6  . metFORMIN (GLUCOPHAGE) 500 MG tablet Take 1 tablet (500 mg total) by mouth 2 (two) times daily with a meal. 60 tablet 2  . triamterene-hydrochlorothiazide (MAXZIDE-25) 37.5-25 MG tablet Take 1 tablet by mouth daily. 90 tablet 1   No current facility-administered medications for this visit.     OBJECTIVE: There were no vitals filed for this visit.   There is no height or weight on file to calculate BMI.    ECOG FS:0 - Asymptomatic  General: Well-developed, well-nourished, no acute distress. Eyes: Pink conjunctiva, anicteric sclera. Breasts: Right mastectomy. Heart: Regular rate and rhythm. No rubs, murmurs, or gallops. Abdomen: Soft, tender, non-distended. No organomegaly noted, normoactive bowel sounds. Musculoskeletal: No edema, cyanosis, or clubbing. Neuro: Alert, answering all questions appropriately. Cranial nerves grossly intact. Skin: No rashes or petechiae noted. Psych: Normal affect.  LAB RESULTS:  Lab Results  Component Value Date   NA 136  12/22/2017   K 3.3 (L) 12/22/2017   CL 102 12/22/2017   CO2 25 12/22/2017   GLUCOSE 239 (H) 12/22/2017   BUN 16 12/22/2017   CREATININE 1.06 (H) 12/22/2017   CALCIUM 8.8 (L) 12/22/2017   PROT 7.1 09/12/2017   ALBUMIN 3.4 (L) 09/12/2017   AST 21 09/12/2017   ALT 23 09/12/2017   ALKPHOS 95 09/12/2017   BILITOT 0.4 09/12/2017   GFRNONAA >60 12/22/2017   GFRAA >60 12/22/2017    Lab Results  Component Value Date   WBC 8.1 12/22/2017   NEUTROABS 4.1 09/12/2017   HGB 11.6 (L) 12/22/2017   HCT 35.4 12/22/2017   MCV 79.9 (L) 12/22/2017   PLT 345 12/22/2017     STUDIES: No results found.  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  PLAN:   1. 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 Treatment was discontinued secondary to worsening  peripheral neuropathy. Patient's initial surgery was on July 30, 2017.  She required multiple excisions to obtain clear margins resulting in a full mastectomy with her final surgery occurring on September 28, 2017.  Most recently she underwent complete axillary node dissection on March 04, 2018.  She had multiple complications postoperatively with an infected seroma as well as sepsis.  She has yet to initiate XRT, but has simulation this week and plans to start treatment within 1 to 2 weeks.  Patient was previously given a prescription for letrozole, but is unclear of her compliance.  Return to clinic in mid July at the end of her XRT for further evaluation and additional discussion of her aromatase inhibitor treatment.    2. Genetic testing: Negative. 3. Left upper and lower abdominal pain: Resolved. Patient had colonoscopy that did not reveal any malignancy, but did reveal an abscess.    4. Peripheral  neuropathy: Resolved.  Approximately 30 minutes was spent in discussion of which greater than 50% was consultation.  Patient expressed understanding and was in agreement with this plan. She also understands that She can call clinic at any time with any questions, concerns, or complaints.   Cancer Staging Malignant neoplasm of upper-outer quadrant of right breast in female, estrogen receptor positive ([place]) Staging form: Breast, AJCC 8th Edition - Clinical stage from 03/05/2017: Stage IIA (cT2, cN2a, cM0, G2, ER: Positive, PR: Positive, HER2: Negative) - Signed by Lloyd Huger, MD on 08/10/2017 - Pathologic stage from 08/10/2017: No Stage Recommended (ypT3, pN3a, cM0, G2, ER+, PR+, HER2-) - Signed by Lloyd Huger, MD on 04/10/2018   Lloyd Huger, MD   06/02/2018 9:14 PM

## 2018-06-03 ENCOUNTER — Inpatient Hospital Stay: Payer: Medicaid Other | Admitting: Oncology

## 2018-06-05 ENCOUNTER — Encounter: Payer: Self-pay | Admitting: *Deleted

## 2018-06-10 NOTE — Progress Notes (Deleted)
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  INTERVAL HISTORY: Patient returns to clinic today for further evaluation.  She was given multiple appointments at [facility] to initiate radiation oncology, patient reportedly canceled her appointments.  She currently feels well and is asymptomatic. She denies any peripheral neuropathy or other neurologic complaints. She denies any recent fevers or illnesses. She has a good appetite and denies weight loss. She has no chest pain or shortness of breath. She denies any nausea, vomiting, constipation, or diarrhea. She has no urinary complaints. Patient offers no specific complaints today.  REVIEW OF SYSTEMS:   Review of Systems  Constitutional: Negative.  Negative for fever, malaise/fatigue and weight loss.  HENT: Negative.  Negative for congestion.   Respiratory: Negative.  Negative for cough and shortness of breath.   Cardiovascular: Negative.  Negative for chest pain and leg swelling.  Gastrointestinal: Negative.  Negative for abdominal pain, nausea and vomiting.  Genitourinary: Negative.   Skin: Negative.  Negative for rash.  Neurological: Negative.  Negative for sensory change and weakness.  Psychiatric/Behavioral: Negative.  The patient is not nervous/anxious.      As per HPI. Otherwise, a complete review of systems is negative.  PAST MEDICAL HISTORY: Past Medical History:  Diagnosis Date  . Arthritis    SHOULDER  . Cancer ([place]) 02/28/2017   INVASIVE MAMMARY CARCINOMA WITH MUCINOUS FEATURES.  .  Colonic diverticular abscess 06/21/2017   Colonoscopy 06/25/2017: No evidence of malignancy.  . Hypertension   . Irregular heart beat    PT STATES IT "SKIPS A BEAT"   . Obesity   . Personal history of chemotherapy     PAST SURGICAL HISTORY: Past Surgical History:  Procedure Laterality Date  . ANKLE SURGERY    . BREAST BIOPSY Right 02/28/2017   INVASIVE MAMMARY CARCINOMA WITH MUCINOUS FEATURES.  Marland Kitchen BREAST CYST ASPIRATION Right    NEG  . COLONOSCOPY WITH PROPOFOL N/A 06/25/2017   Procedure: COLONOSCOPY WITH PROPOFOL;  Surgeon: Jonathon Bellows, MD;  [facility] END[facility]COPY;  Service: Gastroenterology;  Laterality: N/A;  . MASTECTOMY, PARTIAL Right 07/30/2017   Procedure: MASTECTOMY PARTIAL;  Surgeon: Robert Bellow, MD;  Location: [facility] ORS;  Service: General;  Laterality: Right;  . PORTACATH PLACEMENT Left 03/15/2017   Procedure: INSERTION PORT-A-CATH;  Surgeon: Robert Bellow, MD;  Location: [facility] ORS;  Service: General;  Laterality: Left;  . RE-EXCISION OF BREAST LUMPECTOMY Right 08/17/2017    INVASIVE CARCINOMA EXTENDS TO NEW LATERAL MARGIN. /RE-EXCISION OF BREAST LUMPECTOMY;: Byrnett, Forest Gleason, MD;  [facility] ORS; General;  Laterality: Right;  . SENTINEL NODE BIOPSY Right 07/30/2017   Procedure: SENTINEL NODE BIOPSY;  Surgeon: Robert Bellow, MD;  Location: [facility] ORS;  Service: General;  Laterality: Right;  . SIMPLE MASTECTOMY WITH AXILLARY SENTINEL NODE BIOPSY Right 09/28/2017   Procedure: SIMPLE MASTECTOMY;  Surgeon: Robert Bellow, MD;  Location: [facility] ORS;  Service: General;  Laterality: Right;    FAMILY HISTORY: Family History  Problem Relation Age of Onset  . Diabetes Father   . Stroke Father   . Breast cancer Mother 57  .  Brain cancer Maternal Aunt 60  . Colon cancer Neg Hx     ADVANCED DIRECTIVES (Y/N):  N  HEALTH MAINTENANCE: Social History   Tobacco Use  . Smoking status: Former Smoker    Packs/day: 0.25    Years: 25.00    Pack years: 6.25    Types:  Cigarettes    Last attempt to quit: 10/20/2017    Years since quitting: 0.6  . Smokeless tobacco: Never Used  Substance Use Topics  . Alcohol use: Yes    Comment: occas  . Drug use: No     Colonoscopy:  PAP:  Bone density:  Lipid panel:  Allergies  Allergen Reactions  . Other Hives and Itching    [name]     Current Outpatient Medications  Medication Sig Dispense Refill  . atorvastatin (LIPITOR) 20 MG tablet Take 1 tablet (20 mg total) by mouth daily. 30 tablet 2  . blood glucose meter kit and supplies Dispense based on patient and insurance preference. Use up to one time daily as directed. (FOR ICD-10 E10.9, E11.9). 1 each 0  . gabapentin (NEURONTIN) 400 MG capsule Take 1 capsule (400 mg total) by mouth at bedtime. 90 capsule 3  . letrozole (FEMARA) 2.5 MG tablet Take 1 tablet (2.5 mg total) by mouth daily. 30 tablet 6  . metFORMIN (GLUCOPHAGE) 500 MG tablet Take 1 tablet (500 mg total) by mouth 2 (two) times daily with a meal. 60 tablet 2  . triamterene-hydrochlorothiazide (MAXZIDE-25) 37.5-25 MG tablet Take 1 tablet by mouth daily. 90 tablet 1   No current facility-administered medications for this visit.     OBJECTIVE: There were no vitals filed for this visit.   There is no height or weight on file to calculate BMI.    ECOG FS:0 - Asymptomatic  General: Well-developed, well-nourished, no acute distress. Eyes: Pink conjunctiva, anicteric sclera. Breasts: Right mastectomy. Heart: Regular rate and rhythm. No rubs, murmurs, or gallops. Abdomen: Soft, tender, non-distended. No organomegaly noted, normoactive bowel sounds. Musculoskeletal: No edema, cyanosis, or clubbing. Neuro: Alert, answering all questions appropriately. Cranial nerves grossly intact. Skin: No rashes or petechiae noted. Psych: Normal affect.  LAB RESULTS:  Lab Results  Component Value Date   NA 136  12/22/2017   K 3.3 (L) 12/22/2017   CL 102 12/22/2017   CO2 25 12/22/2017   GLUCOSE 239 (H) 12/22/2017   BUN 16 12/22/2017   CREATININE 1.06 (H) 12/22/2017   CALCIUM 8.8 (L) 12/22/2017   PROT 7.1 09/12/2017   ALBUMIN 3.4 (L) 09/12/2017   AST 21 09/12/2017   ALT 23 09/12/2017   ALKPHOS 95 09/12/2017   BILITOT 0.4 09/12/2017   GFRNONAA >60 12/22/2017   GFRAA >60 12/22/2017    Lab Results  Component Value Date   WBC 8.1 12/22/2017   NEUTROABS 4.1 09/12/2017   HGB 11.6 (L) 12/22/2017   HCT 35.4 12/22/2017   MCV 79.9 (L) 12/22/2017   PLT 345 12/22/2017     STUDIES: No results found.  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  PLAN:   1. 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 Treatment was discontinued secondary to worsening  peripheral neuropathy. Patient's initial surgery was on July 30, 2017.  She required multiple excisions to obtain clear margins resulting in a full mastectomy with her final surgery occurring on September 28, 2017.  Most recently she underwent complete axillary node dissection on March 04, 2018.  She had multiple complications postoperatively with an infected seroma as well as sepsis.  She has yet to initiate XRT, but has simulation this week and plans to start treatment within 1 to 2 weeks.  Patient was previously given a prescription for letrozole, but is unclear of her compliance.  Return to clinic in mid July at the end of her XRT for further evaluation and additional discussion of her aromatase inhibitor treatment.    2. Genetic testing: Negative. 3. Left upper and lower abdominal pain: Resolved. Patient had colonoscopy that did not reveal any malignancy, but did reveal an abscess.    4. Peripheral  neuropathy: Resolved.  Approximately 30 minutes was spent in discussion of which greater than 50% was consultation.  Patient expressed understanding and was in agreement with this plan. She also understands that She can call clinic at any time with any questions, concerns, or complaints.   Cancer Staging Malignant neoplasm of upper-outer quadrant of right breast in female, estrogen receptor positive ([place]) Staging form: Breast, AJCC 8th Edition - Clinical stage from 03/05/2017: Stage IIA (cT2, cN2a, cM0, G2, ER: Positive, PR: Positive, HER2: Negative) - Signed by Lloyd Huger, MD on 08/10/2017 - Pathologic stage from 08/10/2017: No Stage Recommended (ypT3, pN3a, cM0, G2, ER+, PR+, HER2-) - Signed by Lloyd Huger, MD on 04/10/2018   Lloyd Huger, MD   06/10/2018 11:57 PM

## 2018-06-14 ENCOUNTER — Inpatient Hospital Stay: Payer: Medicaid Other | Admitting: Oncology

## 2018-06-24 NOTE — Progress Notes (Signed)
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  INTERVAL HISTORY: Patient returns to clinic for further evaluation and follow-up after completing XRT.  She had some skin breakdown and discoloration on her breast from her XRT, but this is healing and nearly back to normal.  She otherwise feels well and is asymptomatic. She denies any peripheral neuropathy or other neurologic complaints. She denies any recent fevers or illnesses. She has a good appetite and denies weight loss. She has no chest pain or shortness of breath. She denies any nausea, vomiting, constipation, or diarrhea. She has no urinary complaints.  Patient offers no further specific complaints today.  REVIEW OF SYSTEMS:   Review of Systems  Constitutional: Negative.  Negative for fever, malaise/fatigue and weight loss.  HENT: Negative.  Negative for congestion.   Respiratory: Negative.  Negative for cough and shortness of breath.   Cardiovascular: Negative.  Negative for chest pain and leg swelling.  Gastrointestinal: Negative.  Negative for abdominal pain, nausea and vomiting.  Genitourinary: Negative.   Skin: Negative.  Negative for rash.  Neurological: Negative.  Negative for sensory change and weakness.  Psychiatric/Behavioral: Negative.  The patient is not nervous/anxious.      As per HPI. Otherwise, a complete review of systems is negative.  PAST MEDICAL HISTORY: Past Medical History:  Diagnosis Date  . Arthritis    SHOULDER  . Cancer ([place]) 02/28/2017   INVASIVE MAMMARY  CARCINOMA WITH MUCINOUS FEATURES.  . Colonic diverticular abscess 06/21/2017   Colonoscopy 06/25/2017: No evidence of malignancy.  . Hypertension   . Irregular heart beat    PT STATES IT "SKIPS A BEAT"   . Obesity   . Personal history of chemotherapy     PAST SURGICAL HISTORY: Past Surgical History:  Procedure Laterality Date  . ANKLE SURGERY    . BREAST BIOPSY Right 02/28/2017   INVASIVE MAMMARY CARCINOMA WITH MUCINOUS FEATURES.  Marland Kitchen BREAST CYST ASPIRATION Right    NEG  . COLONOSCOPY WITH PROPOFOL N/A 06/25/2017   Procedure: COLONOSCOPY WITH PROPOFOL;  Surgeon: Jonathon Bellows, MD;  [facility]: [facility] ENDOSCOPY;  Service: Gastroenterology;  Laterality: N/A;  . MASTECTOMY, PARTIAL Right 07/30/2017   Procedure: MASTECTOMY PARTIAL;  Surgeon: Robert Bellow, MD;  Location: [facility] ORS;  Service: General;  Laterality: Right;  . PORTACATH PLACEMENT Left 03/15/2017   Procedure: INSERTION PORT-A-CATH;  Surgeon: Robert Bellow, MD;  Location: [facility] ORS;  Service: General;  Laterality: Left;  . RE-EXCISION OF BREAST LUMPECTOMY Right 08/17/2017    INVASIVE CARCINOMA EXTENDS TO NEW LATERAL MARGIN. /RE-EXCISION OF BREAST LUMPECTOMY;: Byrnett, Forest Gleason, MD;  [facility] ORS; General;  Laterality: Right;  . SENTINEL NODE BIOPSY Right 07/30/2017   Procedure: SENTINEL NODE BIOPSY;  Surgeon: Robert Bellow, MD;  Location: [facility] ORS;  Service: General;  Laterality: Right;  . SIMPLE MASTECTOMY WITH AXILLARY SENTINEL NODE BIOPSY Right 09/28/2017   Procedure: SIMPLE MASTECTOMY;  Surgeon: Robert Bellow, MD;  Location: [facility] ORS;  Service: General;  Laterality: Right;    FAMILY HISTORY: Family History  Problem Relation Age of Onset  . Diabetes Father   .  Stroke Father   . Breast cancer Mother 50  . Brain cancer Maternal Aunt 60  . Colon cancer Neg Hx     ADVANCED DIRECTIVES (Y/N):  N  HEALTH MAINTENANCE: Social History   Tobacco Use  . Smoking status: Former Smoker    Packs/day: 0.25    Years: 25.00     Pack years: 6.25    Types: Cigarettes    Last attempt to quit: 10/20/2017    Years since quitting: 0.6  . Smokeless tobacco: Never Used  Substance Use Topics  . Alcohol use: Yes    Comment: occas  . Drug use: No     Colonoscopy:  PAP:  Bone density:  Lipid panel:  Allergies  Allergen Reactions  . Other Hives and Itching    [name]     Current Outpatient Medications  Medication Sig Dispense Refill  . atorvastatin (LIPITOR) 20 MG tablet Take 1 tablet (20 mg total) by mouth daily. 30 tablet 2  . blood glucose meter kit and supplies Dispense based on patient and insurance preference. Use up to one time daily as directed. (FOR ICD-10 E10.9, E11.9). 1 each 0  . gabapentin (NEURONTIN) 400 MG capsule Take 1 capsule (400 mg total) by mouth at bedtime. 90 capsule 3  . metFORMIN (GLUCOPHAGE) 500 MG tablet Take 1 tablet (500 mg total) by mouth 2 (two) times daily with a meal. 60 tablet 2  . Oxycodone HCl 10 MG TABS Take 10 mg by mouth every 4 (four) hours as needed. for pain  0  . silver sulfADIAZINE (SILVADENE) 1 % cream Apply to affected area 2x daily    . triamterene-hydrochlorothiazide (MAXZIDE-25) 37.5-25 MG tablet Take 1 tablet by mouth daily. 90 tablet 1  . letrozole (FEMARA) 2.5 MG tablet Take 1 tablet (2.5 mg total) by mouth daily. 30 tablet 3   No current facility-administered medications for this visit.     OBJECTIVE: Vitals:   06/28/18 1459  BP: 130/85  Pulse: 97  Resp: 18  Temp: 99 F (37.2 C)     Body mass index is 40.99 kg/m.    ECOG FS:0 - Asymptomatic   General: Well-developed, well-nourished, no acute distress. Eyes: Pink conjunctiva, anicteric sclera. HEENT: Normocephalic, moist mucous membranes. Breast: Right mastectomy with skin changes consistent with radiation damage. Lungs: Clear to auscultation bilaterally. Heart: Regular rate and rhythm. No rubs,  murmurs, or gallops. Abdomen: Soft, nontender, nondistended. No organomegaly noted, normoactive bowel sounds. Musculoskeletal: No edema, cyanosis, or clubbing. Neuro: Alert, answering all questions appropriately. Cranial nerves grossly intact. Skin: No rashes or petechiae noted. Psych: Normal affect.  LAB RESULTS:  Lab Results  Component Value Date   NA 136 12/22/2017   K 3.3 (L) 12/22/2017   CL 102 12/22/2017   CO2 25 12/22/2017   GLUCOSE 239 (H) 12/22/2017   BUN 16 12/22/2017   CREATININE 1.06 (H) 12/22/2017   CALCIUM 8.8 (L) 12/22/2017   PROT 7.1 09/12/2017   ALBUMIN 3.4 (L) 09/12/2017   AST 21 09/12/2017   ALT 23 09/12/2017   ALKPHOS 95 09/12/2017   BILITOT 0.4 09/12/2017   GFRNONAA >60 12/22/2017   GFRAA >60 12/22/2017    Lab Results  Component Value Date   WBC 8.1 12/22/2017   NEUTROABS 4.1 09/12/2017   HGB 11.6 (L) 12/22/2017   HCT 35.4 12/22/2017   MCV 79.9 (L) 12/22/2017   PLT 345 12/22/2017  STUDIES: No results found.  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  PLAN:   1. 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 Treatment was discontinued secondary to worsening peripheral neuropathy. Patient's initial surgery was on July 30, 2017.  She required multiple excisions to obtain clear margins resulting in a full mastectomy with her final surgery occurring on September 28, 2017.  Most recently she underwent complete axillary node dissection on March 04, 2018.  She had multiple complications postoperatively with an infected seroma as well as sepsis.  She has now finally completed adjuvant XRT at [facility].  Patient was given a prescription for letrozole which she will take for 5 years completing in August 2024.  We  will get baseline bone mineral density in the next 1 to 2 weeks.  Return to clinic in 3 months for routine evaluation.  2. Genetic testing: Negative. 3. Left upper and lower abdominal pain: Resolved. Patient had colonoscopy that did not reveal any malignancy, but did reveal an abscess.    4. Peripheral neuropathy: Resolved.  I spent a total of 30 minutes face-to-face with the patient of which greater than 50% of the visit was spent in counseling and coordination of care as detailed above.   Patient expressed understanding and was in agreement with this plan. She also understands that She can call clinic at any time with any questions, concerns, or complaints.   Cancer Staging Malignant neoplasm of upper-outer quadrant of right breast in female, estrogen receptor positive ([place]) Staging form: Breast, AJCC 8th Edition - Clinical stage from 03/05/2017: Stage IIA (cT2, cN2a, cM0, G2, ER: Positive, PR: Positive, HER2: Negative) - Signed by Lloyd Huger, MD on 08/10/2017 - Pathologic stage from 08/10/2017: No Stage Recommended (ypT3, pN3a, cM0, G2, ER+, PR+, HER2-) - Signed by Lloyd Huger, MD on 04/10/2018   Lloyd Huger, MD   07/01/2018 2:01 PM

## 2018-06-28 ENCOUNTER — Encounter: Payer: Self-pay | Admitting: Oncology

## 2018-06-28 ENCOUNTER — Other Ambulatory Visit: Payer: Self-pay

## 2018-06-28 ENCOUNTER — Inpatient Hospital Stay: Payer: Medicaid Other | Attending: Oncology | Admitting: Oncology

## 2018-06-28 VITALS — BP 130/85 | HR 97 | Temp 99.0°F | Resp 18 | Wt 254.0 lb

## 2018-06-28 DIAGNOSIS — Z923 Personal history of irradiation: Secondary | ICD-10-CM | POA: Insufficient documentation

## 2018-06-28 DIAGNOSIS — Z87891 Personal history of nicotine dependence: Secondary | ICD-10-CM | POA: Insufficient documentation

## 2018-06-28 DIAGNOSIS — C50411 Malignant neoplasm of upper-outer quadrant of right female breast: Secondary | ICD-10-CM | POA: Diagnosis not present

## 2018-06-28 DIAGNOSIS — Z170 Estrogen receptor positive status [ER+]: Secondary | ICD-10-CM | POA: Diagnosis not present

## 2018-06-28 MED ORDER — LETROZOLE 2.5 MG PO TABS: 2.5000 mg | ORAL_TABLET | Freq: Every day | ORAL | 3 refills | Status: DC

## 2018-07-05 ENCOUNTER — Other Ambulatory Visit: Payer: Self-pay

## 2018-07-05 ENCOUNTER — Encounter: Payer: Self-pay | Admitting: Nurse Practitioner

## 2018-07-05 ENCOUNTER — Ambulatory Visit: Payer: Medicaid Other | Admitting: Nurse Practitioner

## 2018-07-05 VITALS — BP 113/76 | HR 87 | Ht 66.0 in | Wt 254.4 lb

## 2018-07-05 DIAGNOSIS — E1165 Type 2 diabetes mellitus with hyperglycemia: Secondary | ICD-10-CM

## 2018-07-05 DIAGNOSIS — B353 Tinea pedis: Secondary | ICD-10-CM

## 2018-07-05 DIAGNOSIS — B351 Tinea unguium: Secondary | ICD-10-CM

## 2018-07-05 NOTE — Progress Notes (Signed)
Subjective:    Patient ID: Chelsea Hudson, female    DOB: 1966-01-27, 52 y.o.   MRN: [id number]  Chelsea Hudson is a 52 y.o. female presenting on 07/05/2018 for Diabetes   HPI Diabetes Pt presents today for follow up of Type 2 diabetes mellitus. She is checking fasting am CBG at home with a range of Today 136, highest 160s, usual is 140-150.  She does not have her log with her as requested. - Current diabetic medications include: metformin 500 mg once daily.  Did not increase dose as requested to twice daily. - She is symptomatic with paresthesias related to chemo.  Stable per pt..  - She denies polydipsia, polyphagia, polyuria, headaches, diaphoresis, shakiness, chills and changes in vision.   - Clinical course has been improving. - She  reports no regular exercise routine. - Her diet is moderate in salt, moderate in fat, and low in carbohydrates.  Eating lots of tuna and salad.  Water, Tea (sugar) once-twice per month. Also eating chicken, pork chop with BBQ sauce.   - Weight trend: decreasing steadily  PREVENTION: Eye exam current (within one year): no Foot exam current (within one year): yes  Lipid/ASCVD risk reduction - on statin: atorvastatin 20 mg once daily.  Kidney protection - on ace or arb: Patient defers screening and med to next visit.  Recent Labs    01/02/18 1323 05/24/18 0948  HGBA1C 8.6* 9.0*   Tinea Pedis Patient with "dry" feet and nail discoloration.  Due for foot exam today.  Social History   Tobacco Use  . Smoking status: Former Smoker    Packs/day: 0.25    Years: 25.00    Pack years: 6.25    Types: Cigarettes    Last attempt to quit: 10/20/2017    Years since quitting: 0.7  . Smokeless tobacco: Never Used  Substance Use Topics  . Alcohol use: Yes    Comment: occas  . Drug use: No    Review of Systems Per HPI unless specifically indicated above     Objective:    BP 113/76 (BP Location: Left Arm, Patient Position: Sitting, Cuff Size:  Large)   Pulse 87   Ht 5\' 6"  (1.676 m)   Wt 254 lb 6.4 oz (115.4 kg)   LMP 01/31/2014 (Approximate) Comment: LMP was 3 years ago.  BMI 41.06 kg/m   Wt Readings from Last 3 Encounters:  07/05/18 254 lb 6.4 oz (115.4 kg)  06/28/18 253 lb 15.5 oz (115.2 kg)  05/24/18 258 lb (117 kg)    Physical Exam  Constitutional: She is oriented to person, place, and time. She appears well-developed and well-nourished. No distress.  HENT:  Head: Normocephalic and atraumatic.  Cardiovascular: Normal rate, regular rhythm, S1 normal, S2 normal, normal heart sounds and intact distal pulses.  Pulmonary/Chest: Effort normal and breath sounds normal. No respiratory distress.  Neurological: She is alert and oriented to person, place, and time.  Skin: Skin is warm and dry. Capillary refill takes less than 2 seconds.  Psychiatric: She has a normal mood and affect. Her behavior is normal. Judgment and thought content normal.  Vitals reviewed.   Diabetic Foot Exam - Simple   Simple Foot Form Diabetic Foot exam was performed with the following findings:  Yes 07/05/2018  3:00 PM  Visual Inspection See comments:  Yes Sensation Testing Intact to touch and monofilament testing bilaterally:  Yes Pulse Check Posterior Tibialis and Dorsalis pulse intact bilaterally:  Yes Comments Skin with interdigital fungal  infection as well as fungal nail infection of great toes bilaterally. Skin dry without ulceration or other breakdown.      Results for orders placed or performed in visit on 05/24/18  POCT HgB A1C  Result Value Ref Range   Hemoglobin A1C 9.0 (A) 4.0 - 5.6 %   HbA1c POC (<> result, manual entry)  4.0 - 5.6 %   HbA1c, POC (prediabetic range)  5.7 - 6.4 %   HbA1c, POC (controlled diabetic range)  0.0 - 7.0 %      Assessment & Plan:   Problem List Items Addressed This Visit      Other   Controlled type 2 diabetes mellitus with hyperglycemia ([place]) - Primary    Improving control DM with reports of  fasting CBG reduction over time.  No record today.  Last A1c 9.0 and goal A1c < 7.0%. - Complications - hyperglycemia.  Plan:  1. Change therapy:  - Increase metformin to 500 mg bid with meals 2. Encourage improved lifestyle: - low carb/low glycemic diet reinforced prior education - Increase physical activity to 30 minutes most days of the week.  Explained that increased physical activity increases body's use of sugar for energy. 3. Check fasting am CBG and bring log to next visit for review 4. Continue Statin. - Patient declines to start ACEi.  Check urine microalbumin at next visit as patient is not ready to start medication today. 5. DM Foot exam done today with abnormal fungal findings.   and Advised to schedule DM ophtho exam, send record. 6. Follow-up 2 months for urine microalbumin and A1c        Other Visit Diagnoses    Tinea pedis of both feet       Fungal nail infection          # Tinea pedis Acute tinea pedis infection of bilateral lower extremities.  Patient without regular use of antifungal ointment.  Fungal nail appearance also with thickened nail plates of great toes.  Patient admits chemo changed her nail consistency, which is complicating course of illness. - START clotrimazole ointment OTC. Apply topically bid x 14 days. - START using antifungal powder in shoes as they cannot be laundered. - Encouraged regular, daily foot exam at home.  Use nourishing lotion daily. - Followup 2 months for recheck.  Follow up plan: Return in about 2 months (around 09/04/2018) for diabetes (urine microalb/A1c), hypertension.  Cassell Smiles, DNP, AGPCNP-BC Adult Gerontology Primary Care Nurse Practitioner [facility] 07/05/2018, 3:21 PM

## 2018-07-05 NOTE — Assessment & Plan Note (Signed)
Improving control DM with reports of fasting CBG reduction over time.  No record today.  Last A1c 9.0 and goal A1c < 7.0%. - Complications - hyperglycemia.  Plan:  1. Change therapy:  - Increase metformin to 500 mg bid with meals 2. Encourage improved lifestyle: - low carb/low glycemic diet reinforced prior education - Increase physical activity to 30 minutes most days of the week.  Explained that increased physical activity increases body's use of sugar for energy. 3. Check fasting am CBG and bring log to next visit for review 4. Continue Statin. - Patient declines to start ACEi.  Check urine microalbumin at next visit as patient is not ready to start medication today. 5. DM Foot exam done today with abnormal fungal findings.   and Advised to schedule DM ophtho exam, send record. 6. Follow-up 2 months for urine microalbumin and A1c

## 2018-07-05 NOTE — Patient Instructions (Signed)
Chelsea Hudson,   1. Thank you for coming in to clinic today. Your provider would like to you have your annual eye exam. Please contact your current eye doctor or here are some good options for you to contact.   [facility]   Address: [address] [place], [place] [address] Phone: [phone]  Website: [email or web address] [address] [place], [place] [address] Phone: [phone] [email or web address]  [facility]  Address: [place], [place], [place] [address] Phone: [phone]   [facility] [address] [place], [place] [address] Phone: [phone]  [facility] Address: [place], [place], [place] [address]  Phone: [phone]   2. INCREASE metformin to 500 mg one tablet twice daily with meals.    3. Continue all other medications without changes.    Please schedule a follow-up appointment with Cassell Smiles, AGNP. Return in about 2 months (around 09/04/2018) for diabetes, hypertension.  If you have any other questions or concerns, please feel free to call the clinic or send a message through [place]. You may also schedule an earlier appointment if necessary.  You will receive a survey after today's visit either digitally by e-mail or paper by [employer]. Your experiences and feedback matter to [place].  Please respond so we know how we are doing as we provide care for you.   Cassell Smiles, DNP, AGNP-BC Adult Gerontology Nurse Practitioner [place]

## 2018-07-10 ENCOUNTER — Ambulatory Visit: Admission: RE | Admit: 2018-07-10 | Payer: Medicaid Other | Source: Ambulatory Visit

## 2018-07-16 ENCOUNTER — Ambulatory Visit: Payer: Medicaid Other | Admitting: General Surgery

## 2018-07-17 ENCOUNTER — Ambulatory Visit
Admission: RE | Admit: 2018-07-17 | Discharge: 2018-07-17 | Disposition: A | Discharge status: Home / Self Care | Payer: Medicaid Other | Source: Ambulatory Visit | Attending: Oncology | Admitting: Oncology

## 2018-07-17 DIAGNOSIS — C50411 Malignant neoplasm of upper-outer quadrant of right female breast: Secondary | ICD-10-CM

## 2018-07-17 DIAGNOSIS — Z170 Estrogen receptor positive status [ER+]: Secondary | ICD-10-CM | POA: Insufficient documentation

## 2018-07-22 IMAGING — CT NM PET TUM IMG INITIAL (PI) SKULL BASE T - THIGH
1 of 9 series · 1 of 25 positions shown · non-contrast
Comparison: Multiple exams, including CT abdomen from 05/02/2012

CLINICAL DATA: Initial treatment strategy for right breast cancer.

EXAM:
NUCLEAR MEDICINE PET SKULL BASE TO THIGH
TECHNIQUE: 12.4 mCi F-18 FDG was injected intravenously. Full-ring PET imaging
was performed from the skull base to thigh after the radiotracer. CT
data was obtained and used for attenuation correction and anatomic
localization.
FASTING BLOOD GLUCOSE:  Value: 118 mg/dl

[Series 3: ct wb 5.0 b30f · axial · 5.0mm · 0.98mm/px · 1 of 290 slices shown]
[im 290/290  brain]
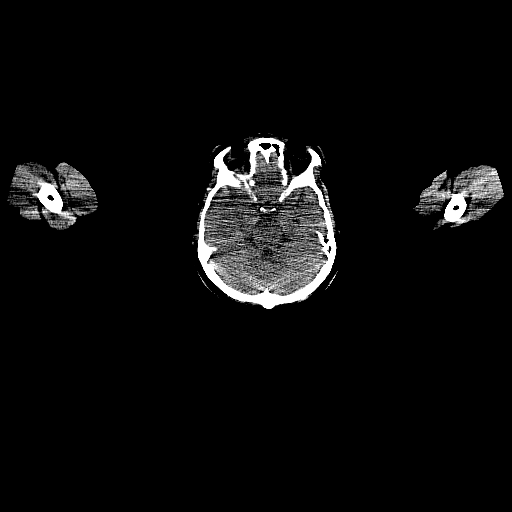

[1 of 25 positions shown; findings below may reference images not displayed]

FINDINGS: NECK

No hypermetabolic lymph nodes in the neck.

Mild chronic left maxillary sinusitis. Low-level fairly symmetric
activity in the palate and tonsils is highly likely to be
physiologic.

CHEST

Hypermetabolic right axillary adenopathy. Index right axillary node
2.3 cm in short axis on image 67/3, maximum SUV 24.0. A separate
right axillary/subpectoral lymph node adjacent to the axillary
neurovascular structures measures 1.2 cm in short axis on image 51/
3 and has a maximum SUV of 6.0, compatible with malignant
involvement. There are other small subpectoral lymph nodes on the
right which only have low-grade activity but merit surveillance.

In the upper portion of the right breast there some faint bandlike
opacity adjacent to a small clip, maximum SUV in this vicinity 3.7,
likely a postoperative region.

No obvious worrisome lung nodules on the CT data. Upper normal size
paratracheal and AP window lymph nodes do not demonstrate metabolic
activity above the blood pool.

ABDOMEN/PELVIS

No abnormal hypermetabolic activity within the liver, pancreas,
adrenal glands, or spleen. No hypermetabolic lymph nodes in the
abdomen or pelvis.

A 9 cm segment of the sigmoid colon demonstrates mildly accentuated
metabolic activity, particularly proximally where the maximum SUV is
8.9. There is evidence of scattered diverticula and possibly some
subtle inflammation along its proximal region, for example images
200-207 of series 3. This may reflect mild diverticulitis but
sigmoid colon tumor is not specifically excluded given this
constellation of findings.

Small retroperitoneal and pelvic lymph nodes are not pathologically
enlarged or hypermetabolic.

SKELETON

No focal hypermetabolic activity to suggest skeletal metastasis.
IMPRESSION: 1. Hypermetabolic right axillary/subpectoral adenopathy. Low-grade
activity in the right upper breast likely at the postoperative site.
Appearance compatible with metastatic spread to right
axillary/subpectoral lymph nodes.
2. There is some sigmoid colon diverticulosis, with faint
inflammatory findings adjacent to the sigmoid colon proximally, and
with accentuated activity in the involved segment of the sigmoid
colon. This is probably inflammatory activity. Strictly speaking,
malignancy in the sigmoid colon could have a similar appearance.
Following any therapy for potential mild diverticulitis, consider
colonoscopy if not recently performed in order to further assess the
sigmoid colon.
3.  Aortic Atherosclerosis ([id number]).
4. Mild chronic left maxillary sinusitis.

## 2018-08-16 ENCOUNTER — Telehealth: Payer: Self-pay | Admitting: Nurse Practitioner

## 2018-08-16 DIAGNOSIS — E1165 Type 2 diabetes mellitus with hyperglycemia: Secondary | ICD-10-CM

## 2018-08-16 MED ORDER — METFORMIN HCL 500 MG PO TABS: 1000.0000 mg | ORAL_TABLET | Freq: Two times a day (BID) | ORAL | 2 refills | Status: DC

## 2018-08-16 NOTE — Telephone Encounter (Signed)
Incoming call

## 2018-08-16 NOTE — Telephone Encounter (Signed)
Pt needs a new prescription for metformin.  She said Lauren increased dosage to 2 tablet am and 2 tablets pm.  She uses [facility].  Her call back [phone]

## 2018-08-28 ENCOUNTER — Encounter: Payer: Self-pay | Admitting: Nurse Practitioner

## 2018-09-06 ENCOUNTER — Ambulatory Visit: Payer: Medicaid Other | Admitting: Nurse Practitioner

## 2018-09-13 ENCOUNTER — Ambulatory Visit: Payer: Medicaid Other | Admitting: Nurse Practitioner

## 2018-09-22 NOTE — Progress Notes (Deleted)
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  INTERVAL HISTORY: Patient returns to clinic for further evaluation and follow-up after completing XRT.  She had some skin breakdown and discoloration on her breast from her XRT, but this is healing and nearly back to normal.  She otherwise feels well and is asymptomatic. She denies any peripheral neuropathy or other neurologic complaints. She denies any recent fevers or illnesses. She has a good appetite and denies weight loss. She has no chest pain or shortness of breath. She denies any nausea, vomiting, constipation, or diarrhea. She has no urinary complaints.  Patient offers no further specific complaints today.  REVIEW OF SYSTEMS:   Review of Systems  Constitutional: Negative.  Negative for fever, malaise/fatigue and weight loss.  HENT: Negative.  Negative for congestion.   Respiratory: Negative.  Negative for cough and shortness of breath.   Cardiovascular: Negative.  Negative for chest pain and leg swelling.  Gastrointestinal: Negative.  Negative for abdominal pain, nausea and vomiting.  Genitourinary: Negative.   Skin: Negative.  Negative for rash.  Neurological: Negative.  Negative for sensory change and weakness.  Psychiatric/Behavioral: Negative.  The patient is not nervous/anxious.      As per HPI. Otherwise, a complete review of systems is negative.  PAST MEDICAL HISTORY: Past Medical History:  Diagnosis Date  . Arthritis    SHOULDER  . Cancer ([place]) 02/28/2017   INVASIVE MAMMARY  CARCINOMA WITH MUCINOUS FEATURES.  . Colonic diverticular abscess 06/21/2017   Colonoscopy 06/25/2017: No evidence of malignancy.  . Hypertension   . Irregular heart beat    PT STATES IT "SKIPS A BEAT"   . Obesity   . Personal history of chemotherapy     PAST SURGICAL HISTORY: Past Surgical History:  Procedure Laterality Date  . ANKLE SURGERY    . BREAST BIOPSY Right 02/28/2017   INVASIVE MAMMARY CARCINOMA WITH MUCINOUS FEATURES.  Marland Kitchen BREAST CYST ASPIRATION Right    NEG  . COLONOSCOPY WITH PROPOFOL N/A 06/25/2017   Procedure: COLONOSCOPY WITH PROPOFOL;  Surgeon: Jonathon Bellows, MD;  Location[facility] ENDOSCOPY;  Service: Gastroenterology;  Laterality: N/A;  . MASTECTOMY, PARTIAL Right 07/30/2017   Procedure: MASTECTOMY PARTIAL;  Surgeon: Robert Bellow, MD;  Location: [facility] ORS;  Service: General;  Laterality: Right;  . PORTACATH PLACEMENT Left 03/15/2017   Procedure: INSERTION PORT-A-CATH;  Surgeon: Robert Bellow, MD;  Location: [facility] ORS;  Service: General;  Laterality: Left;  . RE-EXCISION OF BREAST LUMPECTOMY Right 08/17/2017    INVASIVE CARCINOMA EXTENDS TO NEW LATERAL MARGIN. /RE-EXCISION OF BREAST LUMPECTOMY;: Byrnett, Forest Gleason, MD;  [facility] ORS; General;  Laterality: Right;  . SENTINEL NODE BIOPSY Right 07/30/2017   Procedure: SENTINEL NODE BIOPSY;  Surgeon: Robert Bellow, MD;  Location: [facility] ORS;  Service: General;  Laterality: Right;  . SIMPLE MASTECTOMY WITH AXILLARY SENTINEL NODE BIOPSY Right 09/28/2017   Procedure: SIMPLE MASTECTOMY;  Surgeon: Robert Bellow, MD;  Location: [facility] ORS;  Service: General;  Laterality: Right;    FAMILY HISTORY: Family History  Problem Relation Age of Onset  . Diabetes Father   .  Stroke Father   . Breast cancer Mother 52  . Brain cancer Maternal Aunt 60  . Colon cancer Neg Hx     ADVANCED DIRECTIVES (Y/N):  N  HEALTH MAINTENANCE: Social History   Tobacco Use  . Smoking status: Former Smoker    Packs/day: 0.25    Years: 25.00     Pack years: 6.25    Types: Cigarettes    Last attempt to quit: 10/20/2017    Years since quitting: 0.9  . Smokeless tobacco: Never Used  Substance Use Topics  . Alcohol use: Yes    Comment: occas  . Drug use: No     Colonoscopy:  PAP:  Bone density:  Lipid panel:  Allergies  Allergen Reactions  . Other Hives and Itching    [name]     Current Outpatient Medications  Medication Sig Dispense Refill  . atorvastatin (LIPITOR) 20 MG tablet Take 1 tablet (20 mg total) by mouth daily. 30 tablet 2  . blood glucose meter kit and supplies Dispense based on patient and insurance preference. Use up to one time daily as directed. (FOR ICD-10 E10.9, E11.9). 1 each 0  . gabapentin (NEURONTIN) 400 MG capsule Take 1 capsule (400 mg total) by mouth at bedtime. 90 capsule 3  . letrozole (FEMARA) 2.5 MG tablet Take 1 tablet (2.5 mg total) by mouth daily. 30 tablet 3  . metFORMIN (GLUCOPHAGE) 500 MG tablet Take 2 tablets (1,000 mg total) by mouth 2 (two) times daily with a meal. 60 tablet 2  . Oxycodone HCl 10 MG TABS Take 10 mg by mouth every 4 (four) hours as needed. for pain  0  . silver sulfADIAZINE (SILVADENE) 1 % cream Apply to affected area 2x daily    . triamterene-hydrochlorothiazide (MAXZIDE-25) 37.5-25 MG tablet Take 1 tablet by mouth daily. 90 tablet 1   No current facility-administered medications for this visit.     OBJECTIVE: There were no vitals filed for this visit.   There is no height or weight on file to calculate BMI.    ECOG FS:0 - Asymptomatic   General: Well-developed, well-nourished, no acute distress. Eyes: Pink conjunctiva, anicteric sclera. HEENT: Normocephalic, moist mucous membranes. Breast: Right mastectomy with skin changes consistent with radiation damage. Lungs: Clear to auscultation bilaterally. Heart: Regular rate and rhythm. No rubs, murmurs, or  gallops. Abdomen: Soft, nontender, nondistended. No organomegaly noted, normoactive bowel sounds. Musculoskeletal: No edema, cyanosis, or clubbing. Neuro: Alert, answering all questions appropriately. Cranial nerves grossly intact. Skin: No rashes or petechiae noted. Psych: Normal affect.  LAB RESULTS:  Lab Results  Component Value Date   NA 136 12/22/2017   K 3.3 (L) 12/22/2017   CL 102 12/22/2017   CO2 25 12/22/2017   GLUCOSE 239 (H) 12/22/2017   BUN 16 12/22/2017   CREATININE 1.06 (H) 12/22/2017   CALCIUM 8.8 (L) 12/22/2017   PROT 7.1 09/12/2017   ALBUMIN 3.4 (L) 09/12/2017   AST 21 09/12/2017   ALT 23 09/12/2017   ALKPHOS 95 09/12/2017   BILITOT 0.4 09/12/2017   GFRNONAA >60 12/22/2017   GFRAA >60 12/22/2017    Lab Results  Component Value Date   WBC 8.1 12/22/2017   NEUTROABS 4.1 09/12/2017   HGB 11.6 (L) 12/22/2017   HCT 35.4 12/22/2017   MCV 79.9 (L) 12/22/2017   PLT 345 12/22/2017     STUDIES: No results found.  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  PLAN:   1. 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 Treatment was discontinued secondary to worsening peripheral neuropathy. Patient's initial surgery was on July 30, 2017.  She required multiple excisions to obtain clear margins resulting in a full mastectomy with her final surgery occurring on September 28, 2017.  Most recently she underwent complete axillary node dissection on March 04, 2018.  She had multiple complications postoperatively with an infected seroma as well as sepsis.  She has now finally completed adjuvant [facility].  Patient was given a prescription for letrozole which she will take for 5 years completing in August 2024.  We will get  baseline bone mineral density in the next 1 to 2 weeks.  Return to clinic in 3 months for routine evaluation.  2. Genetic testing: Negative. 3. Left upper and lower abdominal pain: Resolved. Patient had colonoscopy that did not reveal any malignancy, but did reveal an abscess.    4. Peripheral neuropathy: Resolved.  I spent a total of 30 minutes face-to-face with the patient of which greater than 50% of the visit was spent in counseling and coordination of care as detailed above.   Patient expressed understanding and was in agreement with this plan. She also understands that She can call clinic at any time with any questions, concerns, or complaints.   Cancer Staging Malignant neoplasm of upper-outer quadrant of right breast in female, estrogen receptor positive ([place]) Staging form: Breast, AJCC 8th Edition - Clinical stage from 03/05/2017: Stage IIA (cT2, cN2a, cM0, G2, ER: Positive, PR: Positive, HER2: Negative) - Signed by Lloyd Huger, MD on 08/10/2017 - Pathologic stage from 08/10/2017: No Stage Recommended (ypT3, pN3a, cM0, G2, ER+, PR+, HER2-) - Signed by Lloyd Huger, MD on 04/10/2018   Lloyd Huger, MD   09/22/2018 11:00 PM

## 2018-09-26 ENCOUNTER — Encounter: Payer: Self-pay | Admitting: General Surgery

## 2018-09-26 ENCOUNTER — Other Ambulatory Visit: Payer: Self-pay

## 2018-09-26 ENCOUNTER — Ambulatory Visit: Payer: Medicaid Other | Admitting: Nurse Practitioner

## 2018-09-26 ENCOUNTER — Ambulatory Visit: Payer: Medicaid Other | Admitting: General Surgery

## 2018-09-26 ENCOUNTER — Encounter: Payer: Self-pay | Admitting: Nurse Practitioner

## 2018-09-26 VITALS — BP 135/84 | HR 91 | Temp 97.5°F | Resp 20 | Ht 66.0 in | Wt 246.6 lb

## 2018-09-26 VITALS — BP 118/78 | HR 102 | Temp 98.9°F | Resp 16 | Ht 66.0 in | Wt 246.0 lb

## 2018-09-26 DIAGNOSIS — I10 Essential (primary) hypertension: Secondary | ICD-10-CM

## 2018-09-26 DIAGNOSIS — C50411 Malignant neoplasm of upper-outer quadrant of right female breast: Secondary | ICD-10-CM

## 2018-09-26 DIAGNOSIS — E1165 Type 2 diabetes mellitus with hyperglycemia: Secondary | ICD-10-CM

## 2018-09-26 DIAGNOSIS — T466X5A Adverse effect of antihyperlipidemic and antiarteriosclerotic drugs, initial encounter: Secondary | ICD-10-CM

## 2018-09-26 DIAGNOSIS — N3091 Cystitis, unspecified with hematuria: Secondary | ICD-10-CM | POA: Diagnosis not present

## 2018-09-26 DIAGNOSIS — G620 Drug-induced polyneuropathy: Secondary | ICD-10-CM | POA: Diagnosis not present

## 2018-09-26 DIAGNOSIS — E785 Hyperlipidemia, unspecified: Secondary | ICD-10-CM

## 2018-09-26 DIAGNOSIS — E1169 Type 2 diabetes mellitus with other specified complication: Secondary | ICD-10-CM

## 2018-09-26 DIAGNOSIS — Z170 Estrogen receptor positive status [ER+]: Secondary | ICD-10-CM | POA: Diagnosis not present

## 2018-09-26 DIAGNOSIS — T451X5A Adverse effect of antineoplastic and immunosuppressive drugs, initial encounter: Secondary | ICD-10-CM

## 2018-09-26 DIAGNOSIS — M791 Myalgia, unspecified site: Secondary | ICD-10-CM

## 2018-09-26 LAB — POCT GLYCOSYLATED HEMOGLOBIN (HGB A1C): Hemoglobin A1C: 6.3 % — AB (ref 4.0–5.6)

## 2018-09-26 LAB — POCT UA - MICROALBUMIN: Microalbumin Ur, POC: 20 mg/L

## 2018-09-26 LAB — POCT URINALYSIS DIPSTICK
Bilirubin, UA: NEGATIVE
Glucose, UA: NEGATIVE
Ketones, UA: NEGATIVE
Nitrite, UA: NEGATIVE
Protein, UA: NEGATIVE
Spec Grav, UA: 1.01 (ref 1.010–1.025)
Urobilinogen, UA: 1 E.U./dL
pH, UA: 5 (ref 5.0–8.0)

## 2018-09-26 MED ORDER — METFORMIN HCL 500 MG PO TABS: 1000.0000 mg | ORAL_TABLET | Freq: Two times a day (BID) | ORAL | 1 refills | Status: DC

## 2018-09-26 MED ORDER — CEPHALEXIN 500 MG PO CAPS: 500.0000 mg | ORAL_CAPSULE | Freq: Three times a day (TID) | ORAL | 0 refills | Status: AC

## 2018-09-26 MED ORDER — TRIAMTERENE-HCTZ 37.5-25 MG PO TABS: 1.0000 | ORAL_TABLET | Freq: Every day | ORAL | 1 refills | Status: DC

## 2018-09-26 MED ORDER — ATORVASTATIN CALCIUM 10 MG PO TABS: 10.0000 mg | ORAL_TABLET | ORAL | 1 refills | Status: DC

## 2018-09-26 NOTE — Patient Instructions (Addendum)
Patient is to return to the office to see Dr.Byrnett in 1year.She is due to have her mammogram for her left breast she would like to be seen in [facility].

## 2018-09-26 NOTE — Progress Notes (Signed)
Subjective:    Patient ID: Chelsea Hudson, female    DOB: Jul 19, 1966, 52 y.o.   MRN: [id number]  Chelsea Hudson is a 52 y.o. female presenting on 09/26/2018 for Diabetes   HPI Diabetes Pt presents today for follow up of Type 2 diabetes mellitus. She is checking CBG at home, but does not have her log with her today - Current diabetic medications include: metformin 1000 mg bid - She is symptomatic with tingling of fingers and toes - chemo induced neuropathy, however.  - She denies polydipsia, polyphagia, polyuria, headaches, diaphoresis, shakiness, chills and changes in vision.   - Clinical course has been stable. - She  reports no regular exercise routine. - Her diet is moderate in salt, moderate in fat, and moderate in carbohydrates. - Weight trend: decreasing steadily with intentional work on improving diet.  Patient states she eats all food between 8-6pm  PREVENTION: Eye exam current (within one year): no Foot exam current (within one year): yes Lipid/ASCVD risk reduction - on statin: atorvastatin, but stopped taking this due to muscle cramps Kidney protection - on ace or arb: no - last microalbumin WNL - repeat today Recent Labs    01/02/18 1323 05/24/18 0948 09/26/18 1125  HGBA1C 8.6* 9.0* 6.3*   Lab Results  Component Value Date   MICROALBUR 20 09/26/2018   Hypertension - She is not checking BP at home or outside of clinic.    - Current medications: triamterene-HCTZ, tolerating well without side effects - She is not currently symptomatic. - Pt denies headache, lightheadedness, dizziness, changes in vision, chest tightness/pressure, palpitations, leg swelling, sudden loss of speech or loss of consciousness.  Dysuria Patient also complains of dysuria with hematuria, urinary burning, frequency, and urgency.  Pt denies fever, chills, sweats, nausea, vomiting, diarrhea and constipation. Believes she may have a UTI.  Social History   Tobacco Use  . Smoking status:  Former Smoker    Packs/day: 0.25    Years: 25.00    Pack years: 6.25    Types: Cigarettes    Last attempt to quit: 10/20/2017    Years since quitting: 0.9  . Smokeless tobacco: Former Network engineer Use Topics  . Alcohol use: Yes    Comment: occas  . Drug use: No    Review of Systems Per HPI unless specifically indicated above     Objective:    BP 118/78   Pulse (!) 102   Temp 98.9 F (37.2 C) (Oral)   Resp 16   Ht 5\' 6"  (1.676 m)   Wt 246 lb (111.6 kg)   LMP 01/31/2014 (Approximate) Comment: LMP was 3 years ago.  BMI 39.71 kg/m   Wt Readings from Last 3 Encounters:  09/26/18 246 lb (111.6 kg)  07/05/18 254 lb 6.4 oz (115.4 kg)  06/28/18 253 lb 15.5 oz (115.2 kg)    Physical Exam  Constitutional: She is oriented to person, place, and time. She appears well-developed and well-nourished. No distress.  HENT:  Head: Normocephalic and atraumatic.  Neck: Normal range of motion. Neck supple. Carotid bruit is not present.  Cardiovascular: Normal rate, regular rhythm, S1 normal, S2 normal, normal heart sounds and intact distal pulses.  Pulmonary/Chest: Effort normal and breath sounds normal. No respiratory distress.  Abdominal: Soft. Bowel sounds are normal. There is no tenderness. There is no CVA tenderness.  Musculoskeletal: She exhibits no edema (pedal).  Neurological: She is alert and oriented to person, place, and time.  Skin: Skin is warm and  dry. Capillary refill takes less than 2 seconds.  Psychiatric: She has a normal mood and affect. Her behavior is normal. Judgment and thought content normal.  Vitals reviewed.  Results for orders placed or performed in visit on 09/26/18  POCT UA - Microalbumin  Result Value Ref Range   Microalbumin Ur, POC 20 mg/L  POCT Urinalysis Dipstick  Result Value Ref Range   Color, UA amber    Clarity, UA clear    Glucose, UA Negative Negative   Bilirubin, UA negative    Ketones, UA negative    Spec Grav, UA 1.010 1.010 - 1.025    Blood, UA large    pH, UA 5.0 5.0 - 8.0   Protein, UA Negative Negative   Urobilinogen, UA 1.0 0.2 or 1.0 E.U./dL   Nitrite, UA negative    Leukocytes, UA Large (3+) (A) Negative   Appearance cloudy    Odor foul       Assessment & Plan:   Problem List Items Addressed This Visit      Cardiovascular and Mediastinum   Essential hypertension    Stable and controlled today at goal < 130/80.   - No current kidney complications.  Plan: 1. Continue medication w/o changes. 2. Continue heart healthy diet. 3. Increase physical activity to 30 minutes most days of the week. 4. Recheck labs next visit if not followed by oncology.  Last GFR WNL. 5. Follow up 6 months.      Relevant Medications   triamterene-hydrochlorothiazide (MAXZIDE-25) 37.5-25 MG tablet   atorvastatin (LIPITOR) 10 MG tablet     Endocrine   Hyperlipidemia associated with type 2 diabetes mellitus ([place])    LDL goal < 70 in patient who has T2DM and hyperlipidemia.  Patient has taken atorvastatin and had myalgias, so stopped the med.  Plan: 1. Discussed pleomorphic cardiovascular benefit briefly, patient understands risk reduction. 2. Will dose to maximally tolerated statin dose.  - Take atorvastatin 10 mg once weekly.  After 2 weeks, increase to two days per week.  Then continue until taking daily if tolerated.  If having myalgias, stop for 2 weeks and resume at previously tolerated dose.  Patient verbalizes understanding. 3. Repeat lipid at next visit on statin. 4. Follow-up 3 months.      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  Patient notes is tolerable, but persistent. Continues taking gabapentin at bedtime with good control. Denies pain.  Continue gabapentin 400 mg at bedtime.  Follow-up 3-6 months.        Other   Controlled type 2 diabetes  mellitus with hyperglycemia ([place]) - Primary    Controlled and stable DM today with A1c 6.3%, decreased from 9.0% in July and goal A1c < 7.0%. - Complications - hyperlipidemia.  Plan:  1. Continue current therapy: metformin 500 mg bid wc 2. Encourage improved lifestyle: - low carb/low glycemic diet reinforced prior education - Increase physical activity to 30 minutes most days of the week.  Explained that increased physical activity increases body's use of sugar for energy. 3. Check fasting am CBG and bring log to next visit for review 4. Continue Statin. - see myalgia due to statin. - Patient declines to start ACEi.  Check urine microalbumin at next visit as patient is not ready to start medication today. 5. Advised to schedule DM  ophtho exam, send record. 6. Follow-up 3 months      Relevant Medications   metFORMIN (GLUCOPHAGE) 500 MG tablet   atorvastatin (LIPITOR) 10 MG tablet   Other Relevant Orders   POCT HgB A1C (Completed)   POCT UA - Microalbumin (Completed)   Myalgia due to statin    See AP hyperlipidemia       Other Visit Diagnoses    Cystitis with hematuria       Relevant Orders   POCT Urinalysis Dipstick (Completed)   Urinalysis, Routine w reflex microscopic    Acute cystitis with hematuria.  Pt symptomatic currently with increased urinary symptoms and hematuria. Currently without systemic signs or symptoms of infection.   - No current risk of concurrent STI.  Plan: 1. START Keflex 500mg  3 times daily for next 5 days.   2. Provided non-pharm measures for UTI prevention for good hygiene. 3. Drink plenty of fluids and improve hydration over next 1 week. 4. Provided precautions for severe symptoms requiring ED visit to include no urine in 24-48 hours. 5. Followup 2-5 days as needed for worsening or persistent symptoms.     Meds ordered this encounter  Medications  . cephALEXin (KEFLEX) 500 MG capsule    Sig: Take 1 capsule (500 mg total) by mouth 3 (three) times  daily for 5 days.    Dispense:  15 capsule    Refill:  0    Order Specific Question:   Supervising Provider    Answer:   Olin Hauser [[id number]]  . metFORMIN (GLUCOPHAGE) 500 MG tablet    Sig: Take 2 tablets (1,000 mg total) by mouth 2 (two) times daily with a meal.    Dispense:  180 tablet    Refill:  1    Order Specific Question:   Supervising Provider    Answer:   Olin Hauser [[id number]]  . triamterene-hydrochlorothiazide (MAXZIDE-25) 37.5-25 MG tablet    Sig: Take 1 tablet by mouth daily.    Dispense:  90 tablet    Refill:  1    Order Specific Question:   Supervising Provider    Answer:   Olin Hauser [[id number]]  . atorvastatin (LIPITOR) 10 MG tablet    Sig: Take 1 tablet (10 mg total) by mouth every Monday, Wednesday, and Friday.    Dispense:  45 tablet    Refill:  1    Fill when patient requests refill.    Order Specific Question:   Supervising Provider    Answer:   Olin Hauser [[id number]]   Follow up plan: Return in about 3 months (around 12/27/2018) for hypertension, diabetes.  Cassell Smiles, DNP, AGPCNP-BC Adult Gerontology Primary Care Nurse Practitioner [facility] 09/26/2018, 11:28 AM

## 2018-09-26 NOTE — Patient Instructions (Addendum)
Chelsea Hudson,   Thank you for coming in to clinic today.  1. Take atorvastatin 10 mg.  Take 1/2 of your 20 mg tablet once weekly.  Then, increase weekly.  Take 2 times a week, then 3 times a week until taking daily.  If you don't tolerate it with any dose increases back up to your prior dose and continue   2. Great work with A1c, BP and weight loss!  Keep living a healthy lifestyle and eating healthy foods!  Please schedule a follow-up appointment with Cassell Smiles, AGNP. Return in about 3 months (around 12/27/2018) for hypertension, diabetes.  If you have any other questions or concerns, please feel free to call the clinic or send a message through [place]. You may also schedule an earlier appointment if necessary.  You will receive a survey after today's visit either digitally by e-mail or paper by [employer]. Your experiences and feedback matter to [place].  Please respond so we know how we are doing as we provide care for you.   Cassell Smiles, DNP, AGNP-BC Adult Gerontology Nurse Practitioner [place]

## 2018-09-26 NOTE — Progress Notes (Signed)
Patient ID: Chelsea Hudson, female   DOB: 10-09-1966, 52 y.o.   MRN: [id number]  Chief Complaint  Patient presents with  . Follow-up    3 mo f/u rec r breast ca/mastectomy    HPI Chelsea Hudson is a 52 y.o. female here today for 3 mo f/u rec r breast ca/mastectomy. Patient states she is feeling well. HPI  Past Medical History:  Diagnosis Date  . Arthritis    Chelsea Hudson  . Cancer ([place]) 02/28/2017   INVASIVE MAMMARY CARCINOMA WITH MUCINOUS FEATURES.  . Colonic diverticular abscess 06/21/2017   Colonoscopy 06/25/2017: No evidence of malignancy.  . Hypertension   . Irregular heart beat    PT STATES IT "SKIPS A BEAT"   . Obesity   . Personal history of chemotherapy     Past Surgical History:  Procedure Laterality Date  . ANKLE SURGERY    . BREAST BIOPSY Right 02/28/2017   INVASIVE MAMMARY CARCINOMA WITH MUCINOUS FEATURES.  Chelsea Hudson BREAST CYST ASPIRATION Right    NEG  . COLONOSCOPY WITH PROPOFOL N/A 06/25/2017   Procedure: COLONOSCOPY WITH PROPOFOL;  Surgeon: Chelsea Bellows, MD;  Location: [facility] ENDOSCOPY;  Service: Gastroenterology;  Laterality: N/A;  . MASTECTOMY, PARTIAL Right 07/30/2017   Procedure: MASTECTOMY PARTIAL;  Surgeon: Chelsea Bellow, MD;  Location: [facility] ORS;  Service: General;  Laterality: Right;  . PORTACATH PLACEMENT Left 03/15/2017   Procedure: INSERTION PORT-A-CATH;  Surgeon: Chelsea Bellow, MD;  Location: [facility] ORS;  Service: General;  Laterality: Left;  . RE-EXCISION OF BREAST LUMPECTOMY Right 08/17/2017    INVASIVE CARCINOMA EXTENDS TO NEW LATERAL MARGIN. /RE-EXCISION OF BREAST LUMPECTOMY;: Byrnett, Forest Gleason, MD;  [facility] ORS; General;  Laterality: Right;  . SENTINEL NODE BIOPSY Right 07/30/2017   Procedure: SENTINEL NODE BIOPSY;  Surgeon: Chelsea Bellow, MD;  Location: [facility] ORS;  Service: General;  Laterality: Right;  . SIMPLE MASTECTOMY WITH AXILLARY SENTINEL NODE BIOPSY Right 09/28/2017   Procedure: SIMPLE MASTECTOMY;  Surgeon: Chelsea Bellow, MD;  Location:  [facility] ORS;  Service: General;  Laterality: Right;    Family History  Problem Relation Age of Onset  . Diabetes Father   . Stroke Father   . Breast cancer Mother 59  . Brain cancer Maternal Aunt 60  . Colon cancer Neg Hx     Social History Social History   Tobacco Use  . Smoking status: Former Smoker    Packs/day: 0.25    Years: 25.00    Pack years: 6.25    Types: Cigarettes    Last attempt to quit: 10/20/2017    Years since quitting: 0.9  . Smokeless tobacco: Former Network engineer Use Topics  . Alcohol use: Yes    Comment: occas  . Drug use: No    Allergies  Allergen Reactions  . Other Hives and Itching    [name]     Current Outpatient Medications  Medication Sig Dispense Refill  . atorvastatin (LIPITOR) 10 MG tablet Take 1 tablet (10 mg total) by mouth every Monday, Wednesday, and Friday. 45 tablet 1  . blood glucose meter kit and supplies Dispense based on patient and insurance preference. Use up to one time daily as directed. (FOR ICD-10 E10.9, E11.9). 1 each 0  . cephALEXin (KEFLEX) 500 MG capsule Take 1 capsule (500 mg total) by mouth 3 (three) times daily for 5 days. 15 capsule 0  .  gabapentin (NEURONTIN) 400 MG capsule Take 1 capsule (400 mg total) by mouth at bedtime. 90 capsule 3  . letrozole (FEMARA) 2.5 MG tablet Take 1 tablet (2.5 mg total) by mouth daily. 30 tablet 3  . metFORMIN (GLUCOPHAGE) 500 MG tablet Take 2 tablets (1,000 mg total) by mouth 2 (two) times daily with a meal. 180 tablet 1  . triamterene-hydrochlorothiazide (MAXZIDE-25) 37.5-25 MG tablet Take 1 tablet by mouth daily. 90 tablet 1   No current facility-administered medications for this visit.     Review of Systems Review of Systems  Constitutional: Negative.   Respiratory: Negative.   Cardiovascular: Negative.     Blood pressure 135/84, pulse 91, temperature (!) 97.5 F (36.4 C),  temperature source Temporal, resp. rate 20, height 5' 6" (1.676 m), weight 246 lb 9.6 oz (111.9 kg), last menstrual period 01/31/2014, SpO2 97 %.  Physical Exam Physical Exam  Constitutional: She is oriented to person, place, and time. She appears well-developed and well-nourished.  Eyes: Conjunctivae are normal. No scleral icterus.  Neck: Normal range of motion.  Cardiovascular: Normal rate, regular rhythm and normal heart sounds.  Pulmonary/Chest: Effort normal and breath sounds normal. Left breast exhibits no inverted nipple, no mass, no nipple discharge, no skin change and no tenderness.    Lymphadenopathy:    She has no cervical adenopathy.  Neurological: She is alert and oriented to person, place, and time.  Skin: Skin is warm and dry.   Upper extremity measurements were obtained to the level 15 cm above as well as 10 to 20 cm below the olecranon process.  Right: 42.5, 32, 23.  Left: 41, 31.5, 23 cm.   Data Reviewed March 08, 2018 excision of right axillary level 2/level 3 nodes: Specimen:Axilla, Right, Right re-operative level 2, 3 Axillary Lymph node dissection. double  long stitch lateral. double short stitch superior,. single long stitch highest level   3 axillary lymph node   Final Diagnosis A: Right axilla, level 2 and 3 lymph node dissection - Three lymph nodes positive for carcinoma (3/3) - Size of largest tumor deposit: At least 20 mm on a single slide - Extracapsular extension: Present, <1 mm beyond the lymph node capsule - Other findings: Skin and subcutaneous tissue with scar    Nodes were identified on CT planning images for chest wall radiation.  Assessment    Doing well status post chest wall radiation.  Extensive skin changes but smooth skin.  Overdue for screening left mammogram.    Plan The patient reports she has seen a Psychiatric nurse in the [facility]  area.  No records available for review.  Patient is to return to the office to see Dr.Darrol Brandenburg in 1 year .She is due to have her mammogram for her left breast she would like to be seen in [facility].  HPI, Physical Exam, Assessment and Plan have been scribed under the direction and in the presence of Hervey Ard, Md.  Eudelia Bunch R. Bobette Mo, CMA  I have completed the exam and reviewed the above documentation for accuracy and completeness.  I agree with the above.  Haematologist has been used and any errors in dictation or transcription are unintentional.  Hervey Ard, M.D., F.A.C.S.  Forest Hudson Jahlon Baines 09/27/2018, 5:23 AM

## 2018-09-27 ENCOUNTER — Inpatient Hospital Stay: Payer: Medicaid Other | Attending: Oncology | Admitting: Oncology

## 2018-09-27 ENCOUNTER — Inpatient Hospital Stay: Payer: Medicaid Other | Admitting: Oncology

## 2018-09-27 ENCOUNTER — Encounter: Payer: Self-pay | Admitting: Oncology

## 2018-09-27 ENCOUNTER — Inpatient Hospital Stay: Payer: Medicaid Other

## 2018-09-27 VITALS — BP 136/89 | HR 99 | Temp 97.7°F | Resp 20 | Wt 243.0 lb

## 2018-09-27 DIAGNOSIS — Z170 Estrogen receptor positive status [ER+]: Secondary | ICD-10-CM | POA: Insufficient documentation

## 2018-09-27 DIAGNOSIS — C50411 Malignant neoplasm of upper-outer quadrant of right female breast: Secondary | ICD-10-CM | POA: Insufficient documentation

## 2018-09-27 DIAGNOSIS — Z87891 Personal history of nicotine dependence: Secondary | ICD-10-CM | POA: Diagnosis not present

## 2018-09-27 DIAGNOSIS — Z79899 Other long term (current) drug therapy: Secondary | ICD-10-CM | POA: Diagnosis not present

## 2018-09-27 DIAGNOSIS — C50919 Malignant neoplasm of unspecified site of unspecified female breast: Secondary | ICD-10-CM

## 2018-09-27 DIAGNOSIS — Z95828 Presence of other vascular implants and grafts: Secondary | ICD-10-CM

## 2018-09-27 MED ORDER — SODIUM CHLORIDE 0.9% FLUSH
10.0000 mL | INTRAVENOUS | Status: DC | PRN
  Administered 2018-09-27: 10 mL via INTRAVENOUS
  Filled 2018-09-27: qty 10

## 2018-09-27 MED ORDER — HEPARIN SOD (PORK) LOCK FLUSH 100 UNIT/ML IV SOLN
500.0000 [IU] | Freq: Once | INTRAVENOUS | Status: AC
  Administered 2018-09-27: 500 [IU] via INTRAVENOUS

## 2018-09-27 NOTE — Progress Notes (Signed)
Patient here today for follow up regarding breast cancer. Patient is currently on letrozole, tolerating well.

## 2018-09-27 NOTE — Progress Notes (Signed)
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  INTERVAL HISTORY: Patient returns to clinic today for routine 42-monthfollow-up.  She is tolerating letrozole well without significant side effects.  She currently feels well and is asymptomatic.  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 She denies any recent fevers or illnesses. She has a good appetite and denies weight loss. She has no chest pain or shortness of breath. She denies any nausea, vomiting, constipation, or diarrhea. She has no urinary complaints.  Patient feels that her baseline offers no specific complaints today.  REVIEW OF SYSTEMS:   Review of Systems  Constitutional: Negative.  Negative for fever, malaise/fatigue and weight loss.  HENT: Negative.  Negative for congestion.   Respiratory: Negative.  Negative for cough and shortness of breath.   Cardiovascular: Negative.  Negative for chest pain and leg swelling.  Gastrointestinal: Negative.  Negative for abdominal pain, nausea and vomiting.  Genitourinary: Negative.  Negative for dysuria.  Musculoskeletal: Negative.  Negative for back pain.  Skin: Negative.  Negative for rash.  Neurological: Negative.  Negative for tingling, sensory change, focal weakness and weakness.  Psychiatric/Behavioral: Negative.  The patient is not nervous/anxious.      As per HPI. Otherwise, a complete review of systems is negative.  PAST MEDICAL HISTORY: Past  Medical History:  Diagnosis Date  . Arthritis    SHOULDER  . Cancer ([place] 02/28/2017   INVASIVE MAMMARY CARCINOMA WITH MUCINOUS FEATURES.  . Colonic diverticular abscess 06/21/2017   Colonoscopy 06/25/2017: No evidence of malignancy.  . Hypertension   . Irregular heart beat    PT STATES IT "SKIPS A BEAT"   . Obesity   . Personal history of chemotherapy     PAST SURGICAL HISTORY: Past Surgical History:  Procedure Laterality Date  . ANKLE SURGERY    . BREAST BIOPSY Right 02/28/2017   INVASIVE MAMMARY CARCINOMA WITH MUCINOUS FEATURES.  .Marland KitchenBREAST CYST ASPIRATION Right    NEG  . COLONOSCOPY WITH PROPOFOL N/A 06/25/2017   Procedure: COLONOSCOPY WITH PROPOFOL;  Surgeon: AJonathon Bellows MD;  [facility];  Service: Gastroenterology;  Laterality: N/A;  . MASTECTOMY, PARTIAL Right 07/30/2017   Procedure: MASTECTOMY PARTIAL;  Surgeon: BRobert Bellow MD;  Location: [facility] ORS;  Service: General;  Laterality: Right;  . PORTACATH PLACEMENT Left 03/15/2017   Procedure: INSERTION PORT-A-CATH;  Surgeon: JRobert Bellow MD;  Location: [facility] ORS;  Service: General;  Laterality: Left;  . RE-EXCISION OF BREAST LUMPECTOMY Right 08/17/2017    INVASIVE CARCINOMA EXTENDS TO NEW LATERAL MARGIN. /RE-EXCISION OF BREAST LUMPECTOMY;: Byrnett, JForest Gleason MD;  [facility] ORS; General;  Laterality: Right;  . SENTINEL NODE BIOPSY Right 07/30/2017   Procedure: SENTINEL NODE BIOPSY;  Surgeon: BRobert Bellow MD;  Location: [facility] ORS;  Service: General;  Laterality: Right;  . SIMPLE MASTECTOMY WITH AXILLARY SENTINEL NODE BIOPSY Right 09/28/2017   Procedure: SIMPLE MASTECTOMY;  Surgeon: BRobert Bellow MD;  Location: [facility] ORS;  Service: General;  Laterality: Right;    FAMILY  HISTORY: Family History  Problem Relation Age of Onset  . Diabetes Father   . Stroke Father   . Breast cancer Mother 35  . Brain cancer Maternal Aunt 60  . Colon cancer Neg Hx     ADVANCED DIRECTIVES (Y/N):  N  HEALTH  MAINTENANCE: Social History   Tobacco Use  . Smoking status: Former Smoker    Packs/day: 0.25    Years: 25.00    Pack years: 6.25    Types: Cigarettes    Last attempt to quit: 10/20/2017    Years since quitting: 0.9  . Smokeless tobacco: Former Network engineer Use Topics  . Alcohol use: Yes    Comment: occas  . Drug use: No     Colonoscopy:  PAP:  Bone density:  Lipid panel:  Allergies  Allergen Reactions  . Other Hives and Itching    [name]     Current Outpatient Medications  Medication Sig Dispense Refill  . atorvastatin (LIPITOR) 10 MG tablet Take 1 tablet (10 mg total) by mouth every Monday, Wednesday, and Friday. 45 tablet 1  . blood glucose meter kit and supplies Dispense based on patient and insurance preference. Use up to one time daily as directed. (FOR ICD-10 E10.9, E11.9). 1 each 0  . cephALEXin (KEFLEX) 500 MG capsule Take 1 capsule (500 mg total) by mouth 3 (three) times daily for 5 days. 15 capsule 0  . gabapentin (NEURONTIN) 400 MG capsule Take 1 capsule (400 mg total) by mouth at bedtime. 90 capsule 3  . letrozole (FEMARA) 2.5 MG tablet Take 1 tablet (2.5 mg total) by mouth daily. 30 tablet 3  . metFORMIN (GLUCOPHAGE) 500 MG tablet Take 2 tablets (1,000 mg total) by mouth 2 (two) times daily with a meal. 180 tablet 1  . triamterene-hydrochlorothiazide (MAXZIDE-25) 37.5-25 MG tablet Take 1 tablet by mouth daily. 90 tablet 1   No current facility-administered medications for this visit.    Facility-Administered Medications Ordered in Other Visits  Medication Dose Route Frequency Provider Last Rate Last Dose  . sodium chloride flush (NS) 0.9 % injection 10 mL  10 mL Intravenous PRN Lloyd Huger, MD   10 mL at 09/27/18 1215    OBJECTIVE: Vitals:   09/27/18 1141  BP: 136/89  Pulse: 99  Resp: 20  Temp: 97.7 F (36.5 C)     Body mass index is  39.22 kg/m.    ECOG FS:0 - Asymptomatic   General: Well-developed, well-nourished, no acute distress. Eyes: Pink conjunctiva, anicteric sclera. HEENT: Normocephalic, moist mucous membranes. Breast: Right mastectomy.  Patient declined exam today. Lungs: Clear to auscultation bilaterally. Heart: Regular rate and rhythm. No rubs, murmurs, or gallops. Abdomen: Soft, nontender, nondistended. No organomegaly noted, normoactive bowel sounds. Musculoskeletal: No edema, cyanosis, or clubbing. Neuro: Alert, answering all questions appropriately. Cranial nerves grossly intact. Skin: No rashes or petechiae noted. Psych: Normal affect.  LAB RESULTS:  Lab Results  Component Value Date   NA 136 12/22/2017   K 3.3 (L) 12/22/2017   CL 102 12/22/2017   CO2 25 12/22/2017   GLUCOSE 239 (H) 12/22/2017   BUN 16 12/22/2017   CREATININE 1.06 (H) 12/22/2017   CALCIUM 8.8 (L) 12/22/2017   PROT 7.1 09/12/2017   ALBUMIN 3.4 (L) 09/12/2017   AST 21 09/12/2017   ALT 23 09/12/2017   ALKPHOS 95 09/12/2017   BILITOT 0.4 09/12/2017  GFRNONAA >60 12/22/2017   GFRAA >60 12/22/2017    Lab Results  Component Value Date   WBC 8.1 12/22/2017   NEUTROABS 4.1 09/12/2017   HGB 11.6 (L) 12/22/2017   HCT 35.4 12/22/2017   MCV 79.9 (L) 12/22/2017   PLT 345 12/22/2017     STUDIES: No results found.  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  PLAN:   1. 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 Treatment was discontinued secondary to worsening peripheral neuropathy. Patient's initial surgery was on July 30, 2017.  She required multiple excisions to obtain clear margins resulting in a full mastectomy with her final surgery occurring on September 28, 2017.  Most recently she underwent complete axillary node dissection on March 04, 2018.  She had multiple complications postoperatively with an infected seroma as well as sepsis.  She has now finally completed adjuvant XRT at [facility].  Continue letrozole for a total of 5 years completing treatment in August 2024.  No intervention is needed at this time.  Return to clinic in 6 months for routine evaluation.  2. Genetic testing: Negative. 3. Left upper and lower abdominal pain: Resolved. Patient had colonoscopy that did not reveal any malignancy, but did reveal an abscess.    4. Peripheral neuropathy: Resolved. 5.  Bone health: Patient had a bone mineral density on July 17, 2018 reported T score of -0.3 which is considered normal.  Repeat in August 2021.  I spent a total of 30 minutes face-to-face with the patient of which greater than 50% of the visit was spent in counseling and coordination of care as detailed above.   Patient expressed understanding and was in agreement with this plan. She also understands that She can call clinic at any time with any questions, concerns, or complaints.   Cancer Staging Malignant neoplasm of upper-outer quadrant of right breast in female, estrogen receptor positive ([place]) Staging form: Breast, AJCC 8th Edition - Clinical stage from 03/05/2017: Stage IIA (cT2, cN2a, cM0, G2, ER: Positive, PR: Positive, HER2: Negative) - Signed by Lloyd Huger, MD on 08/10/2017 - Pathologic stage from 08/10/2017: No Stage Recommended (ypT3, pN3a, cM0, G2, ER+, PR+, HER2-) - Signed by Lloyd Huger, MD on 04/10/2018   Lloyd Huger, MD   09/27/2018 2:24 PM

## 2018-09-30 ENCOUNTER — Telehealth: Payer: Self-pay

## 2018-09-30 NOTE — Telephone Encounter (Signed)
-----   Message from Dominga Ferry, [place] sent at 09/26/2018  5:29 PM EST ----- Please schedule patient for a uni left screening mammo at [place] (patient works in [place]). Needs late pm appt any day. She can be reached at [phone]. She will be expecting your call tomorrow. Thanks.

## 2018-09-30 NOTE — Telephone Encounter (Signed)
The patient is scheduled for a mammogram at [facility] on 10/14/18 at 3:15 pm. She is aware of date, time, and instructions.

## 2018-10-02 ENCOUNTER — Encounter: Payer: Self-pay | Admitting: Nurse Practitioner

## 2018-10-02 DIAGNOSIS — T466X5A Adverse effect of antihyperlipidemic and antiarteriosclerotic drugs, initial encounter: Secondary | ICD-10-CM | POA: Insufficient documentation

## 2018-10-02 DIAGNOSIS — M791 Myalgia, unspecified site: Secondary | ICD-10-CM | POA: Insufficient documentation

## 2018-10-02 DIAGNOSIS — T451X5A Adverse effect of antineoplastic and immunosuppressive drugs, initial encounter: Secondary | ICD-10-CM

## 2018-10-02 DIAGNOSIS — G620 Drug-induced polyneuropathy: Secondary | ICD-10-CM | POA: Insufficient documentation

## 2018-10-02 DIAGNOSIS — E1169 Type 2 diabetes mellitus with other specified complication: Secondary | ICD-10-CM | POA: Insufficient documentation

## 2018-10-02 DIAGNOSIS — E785 Hyperlipidemia, unspecified: Secondary | ICD-10-CM

## 2018-10-02 NOTE — Assessment & Plan Note (Addendum)
Controlled and stable DM today with A1c 6.3%, decreased from 9.0% in July and goal A1c < 7.0%. - Complications - hyperlipidemia.  Plan:  1. Continue current therapy: metformin 500 mg bid wc 2. Encourage improved lifestyle: - low carb/low glycemic diet reinforced prior education - Increase physical activity to 30 minutes most days of the week.  Explained that increased physical activity increases body's use of sugar for energy. 3. Check fasting am CBG and bring log to next visit for review 4. Continue Statin. - see myalgia due to statin. - Patient declines to start ACEi.  Check urine microalbumin at next visit as patient is not ready to start medication today. 5. Advised to schedule DM ophtho exam, send record. 6. Follow-up 3 months

## 2018-10-02 NOTE — Assessment & Plan Note (Signed)
Stable and controlled today at goal < 130/80.   - No current kidney complications.  Plan: 1. Continue medication w/o changes. 2. Continue heart healthy diet. 3. Increase physical activity to 30 minutes most days of the week. 4. Recheck labs next visit if not followed by oncology.  Last GFR WNL. 5. Follow up 6 months.

## 2018-10-02 NOTE — Assessment & Plan Note (Signed)
See AP hyperlipidemia

## 2018-10-02 NOTE — Assessment & Plan Note (Signed)
Stable without worsening.  Patient notes is tolerable, but persistent. Continues taking gabapentin at bedtime with good control. Denies pain.  Continue gabapentin 400 mg at bedtime.  Follow-up 3-6 months.

## 2018-10-02 NOTE — Assessment & Plan Note (Signed)
LDL goal < 70 in patient who has T2DM and hyperlipidemia.  Patient has taken atorvastatin and had myalgias, so stopped the med.  Plan: 1. Discussed pleomorphic cardiovascular benefit briefly, patient understands risk reduction. 2. Will dose to maximally tolerated statin dose.  - Take atorvastatin 10 mg once weekly.  After 2 weeks, increase to two days per week.  Then continue until taking daily if tolerated.  If having myalgias, stop for 2 weeks and resume at previously tolerated dose.  Patient verbalizes understanding. 3. Repeat lipid at next visit on statin. 4. Follow-up 3 months.

## 2018-10-04 ENCOUNTER — Ambulatory Visit: Payer: Medicaid Other | Admitting: Nurse Practitioner

## 2018-10-08 ENCOUNTER — Encounter: Payer: Self-pay | Admitting: Nurse Practitioner

## 2018-10-08 ENCOUNTER — Other Ambulatory Visit: Payer: Self-pay

## 2018-10-08 ENCOUNTER — Ambulatory Visit: Payer: Medicaid Other | Admitting: Nurse Practitioner

## 2018-10-08 VITALS — BP 120/73 | HR 88 | Temp 98.6°F | Resp 17 | Ht 66.0 in | Wt 247.0 lb

## 2018-10-08 DIAGNOSIS — N3001 Acute cystitis with hematuria: Secondary | ICD-10-CM | POA: Diagnosis not present

## 2018-10-08 DIAGNOSIS — R1030 Lower abdominal pain, unspecified: Secondary | ICD-10-CM | POA: Diagnosis not present

## 2018-10-08 LAB — POCT URINALYSIS DIPSTICK
Bilirubin, UA: NEGATIVE
Glucose, UA: NEGATIVE
Ketones, UA: NEGATIVE
Nitrite, UA: NEGATIVE
Protein, UA: POSITIVE — AB
Spec Grav, UA: 1.015 (ref 1.010–1.025)
Urobilinogen, UA: 0.2 E.U./dL
pH, UA: 7.5 (ref 5.0–8.0)

## 2018-10-08 MED ORDER — CIPROFLOXACIN HCL 250 MG PO TABS: 250.0000 mg | ORAL_TABLET | Freq: Two times a day (BID) | ORAL | 0 refills | Status: AC

## 2018-10-08 NOTE — Progress Notes (Signed)
Subjective:    Patient ID: Chelsea Hudson, female    DOB: Apr 26, 1966, 52 y.o.   MRN: [id number]  Chelsea Hudson is a 52 y.o. female presenting on 10/08/2018 for Abdominal Pain (x 3 days, no other urinary symptoms )   HPI Abdominal Pain Patient has concern for possible UTI.  She has no dysuria, frequency.  Patient reports similar symptoms and difficult to treat UTI in past with radiation therapy.  She has not yet seen urology.  Has hesitance to continue taking antibiotics.  Social History   Tobacco Use  . Smoking status: Former Smoker    Packs/day: 0.25    Years: 25.00    Pack years: 6.25    Types: Cigarettes    Last attempt to quit: 10/20/2017    Years since quitting: 0.9  . Smokeless tobacco: Former Network engineer Use Topics  . Alcohol use: Yes    Comment: occas  . Drug use: No    Review of Systems Per HPI unless specifically indicated above     Objective:    BP 120/73 (BP Location: Left Arm, Patient Position: Sitting, Cuff Size: Large)   Pulse 88   Temp 98.6 F (37 C) (Oral)   Resp 17   Ht 5\' 6"  (1.676 m)   Wt 247 lb (112 kg)   LMP 01/31/2014 (Approximate) Comment: LMP was 3 years ago.  BMI 39.87 kg/m   Wt Readings from Last 3 Encounters:  10/08/18 247 lb (112 kg)  09/27/18 243 lb (110.2 kg)  09/26/18 246 lb 9.6 oz (111.9 kg)    Physical Exam  Constitutional: She is oriented to person, place, and time. She appears well-developed and well-nourished. No distress.  HENT:  Head: Normocephalic and atraumatic.  Cardiovascular: Normal rate, regular rhythm, S1 normal, S2 normal, normal heart sounds and intact distal pulses.  Pulmonary/Chest: Effort normal and breath sounds normal. No respiratory distress.  Abdominal: Soft. Bowel sounds are normal. She exhibits no distension. There is no hepatosplenomegaly. There is no tenderness. There is no rigidity, no rebound, no guarding, no CVA tenderness, no tenderness at McBurney's point and negative Murphy's sign. No  hernia.  Neurological: She is alert and oriented to person, place, and time.  Skin: Skin is warm and dry. Capillary refill takes less than 2 seconds.  Psychiatric: She has a normal mood and affect. Her behavior is normal.  Vitals reviewed.  Results for orders placed or performed in visit on 10/08/18  POCT Urinalysis Dipstick  Result Value Ref Range   Color, UA yellow    Clarity, UA cloudy    Glucose, UA Negative Negative   Bilirubin, UA negative    Ketones, UA negative    Spec Grav, UA 1.015 1.010 - 1.025   Blood, UA large    pH, UA 7.5 5.0 - 8.0   Protein, UA Positive (A) Negative   Urobilinogen, UA 0.2 0.2 or 1.0 E.U./dL   Nitrite, UA negative    Leukocytes, UA Large (3+) (A) Negative   Appearance     Odor        Assessment & Plan:   Problem List Items Addressed This Visit    None    Visit Diagnoses    Lower abdominal pain    -  Primary   Relevant Orders   POCT Urinalysis Dipstick (Completed)   Urinalysis, Complete (Completed)   Urine Culture (Completed)   Acute cystitis with hematuria       Relevant Medications   ciprofloxacin (CIPRO) 250  MG tablet   Other Relevant Orders   Ambulatory referral to Urology   Urinalysis, Complete (Completed)   Urine Culture (Completed)    Acute cystitis with hematuria.  Pt symptomatic currently with increased abdominal pain x 3 days. Currently without systemic signs or symptoms of infection.   - No current risk of concurrent STI. - Recurrent infection with > 5 episodes in last 6 months.   Plan: 1. START Ciprofloxacin 250mg  2 times daily for next 5 days.   - Send Urine culture 2. Provided non-pharm measures for UTI prevention for good hygiene. 3. Drink plenty of fluids and improve hydration over next 1 week. 4. Provided precautions for severe symptoms requiring ED visit to include no urine in 24-48 hours. 5. Referral placed for urology. 6. Followup 2-5 days as needed for worsening or persistent symptoms.    Meds ordered this  encounter  Medications  . ciprofloxacin (CIPRO) 250 MG tablet    Sig: Take 1 tablet (250 mg total) by mouth 2 (two) times daily for 5 days.    Dispense:  10 tablet    Refill:  0    Order Specific Question:   Supervising Provider    Answer:   Olin Hauser [[id number]]    Follow up plan: Return 7-10 days if symptoms worsen or fail to improve.  Cassell Smiles, DNP, AGPCNP-BC Adult Gerontology Primary Care Nurse Practitioner [facility] 10/08/2018, 11:51 AM

## 2018-10-08 NOTE — Patient Instructions (Addendum)
Chelsea Hudson,   Thank you for coming in to clinic today.  1. Repeat UTI - START ciprofloxacin 250 mg two times daily for 5 days. - May use AZO or other over the counter urinary pain relief medication for 2-3 days if needed for persistent pelvic pain or pain with urination.  2. We are also sending a urology referral. [facility] [address] [place] Phone: [phone]  Please schedule a follow-up appointment with Cassell Smiles, AGNP. Return 7-10 days if symptoms worsen or fail to improve.  If you have any other questions or concerns, please feel free to call the clinic or send a message through [place]. You may also schedule an earlier appointment if necessary.  You will receive a survey after today's visit either digitally by e-mail or paper by [employer]. Your experiences and feedback matter to [place].  Please respond so we know how we are doing as we provide care for you.   Cassell Smiles, DNP, AGNP-BC Adult Gerontology Nurse Practitioner [place]

## 2018-10-09 LAB — URINALYSIS, COMPLETE
Bacteria, UA: NONE SEEN /HPF
Bilirubin Urine: NEGATIVE
Glucose, UA: NEGATIVE
Hyaline Cast: NONE SEEN /LPF
Ketones, ur: NEGATIVE
Nitrite: NEGATIVE
Specific Gravity, Urine: 1.018 (ref 1.001–1.03)
pH: 7 (ref 5.0–8.0)

## 2018-10-09 LAB — URINE CULTURE
MICRO NUMBER:: 91392671
SPECIMEN QUALITY:: ADEQUATE

## 2018-10-10 ENCOUNTER — Other Ambulatory Visit: Payer: Medicaid Other

## 2018-10-11 ENCOUNTER — Encounter: Payer: Self-pay | Admitting: Nurse Practitioner

## 2018-10-15 ENCOUNTER — Encounter: Payer: Self-pay | Admitting: General Surgery

## 2018-10-28 ENCOUNTER — Other Ambulatory Visit: Payer: Self-pay | Admitting: *Deleted

## 2018-10-28 MED ORDER — LETROZOLE 2.5 MG PO TABS: 2.5000 mg | ORAL_TABLET | Freq: Every day | ORAL | 3 refills | Status: DC

## 2018-11-07 ENCOUNTER — Encounter: Payer: Self-pay | Admitting: Urology

## 2018-11-07 ENCOUNTER — Ambulatory Visit: Payer: Medicaid Other | Admitting: Urology

## 2018-11-07 VITALS — BP 125/85 | HR 88 | Ht 66.0 in | Wt 246.3 lb

## 2018-11-07 DIAGNOSIS — R3915 Urgency of urination: Secondary | ICD-10-CM | POA: Diagnosis not present

## 2018-11-07 DIAGNOSIS — R8281 Pyuria: Secondary | ICD-10-CM | POA: Insufficient documentation

## 2018-11-07 LAB — URINALYSIS, COMPLETE
BILIRUBIN UA: NEGATIVE
Glucose, UA: NEGATIVE
Ketones, UA: NEGATIVE
Nitrite, UA: POSITIVE — AB
Specific Gravity, UA: 1.015 (ref 1.005–1.030)
Urobilinogen, Ur: 0.2 mg/dL (ref 0.2–1.0)
pH, UA: 6 (ref 5.0–7.5)

## 2018-11-07 LAB — MICROSCOPIC EXAMINATION
RBC, UA: NONE SEEN /hpf (ref 0–2)
WBC, UA: >30 /hpf — ABNORMAL HIGH (ref 0–5)

## 2018-11-07 NOTE — Progress Notes (Signed)
 11/07/2018 10:03 AM   Chelsea Hudson 01/26/1966 [id number]  Referring provider: Kennedy, Lauren Renee, NP [address] [place], [place] [address]  Chief Complaint  Patient presents with  . Establish Care  . acute cystitis with hematuria    HPI: 52-year-old female seen in consultation at the request of Lauren Kennedy for evaluation of recurrent urinary tract infections.  She states she began to have UTIs after being treated with chemotherapy and radiation for breast cancer approximately 1 year ago.  She has no bothersome lower urinary tract symptoms.  She does have baseline urinary urgency.  Recent urinalysis have shown significant pyuria but culture has been negative.  She is presently asymptomatic.  Denies flank/abdominal/pelvic pain.  Denies gross hematuria.  No previous history of urologic evaluation.   PMH: Past Medical History:  Diagnosis Date  . Arthritis    SHOULDER  . Cancer (HCC) 02/28/2017   INVASIVE MAMMARY CARCINOMA WITH MUCINOUS FEATURES.  . Colonic diverticular abscess 06/21/2017   Colonoscopy 06/25/2017: No evidence of malignancy.  . Hypertension   . Irregular heart beat    PT STATES IT "SKIPS A BEAT"   . Obesity   . Personal history of chemotherapy     Surgical History: Past Surgical History:  Procedure Laterality Date  . ANKLE SURGERY    . BREAST BIOPSY Right 02/28/2017   INVASIVE MAMMARY CARCINOMA WITH MUCINOUS FEATURES.  . BREAST CYST ASPIRATION Right    NEG  . COLONOSCOPY WITH PROPOFOL N/A 06/25/2017   Procedure: COLONOSCOPY WITH PROPOFOL;  Surgeon: Anna, Kiran, MD;  Location: [facility];  Service: Gastroenterology;  Laterality: N/A;  . MASTECTOMY, PARTIAL Right 07/30/2017   Procedure: MASTECTOMY PARTIAL;  Surgeon: Byrnett, Jeffrey W, MD;  Location: [facility]S;  Service: General;  Laterality: Right;  . PORTACATH PLACEMENT Left 03/15/2017   Procedure: INSERTION PORT-A-CATH;  Surgeon: Jeffrey W Byrnett, MD;  Location: [facility] ORS;  Service: General;   Laterality: Left;  . RE-EXCISION OF BREAST LUMPECTOMY Right 08/17/2017    INVASIVE CARCINOMA EXTENDS TO NEW LATERAL MARGIN. /RE-EXCISION OF BREAST LUMPECTOMY;: Byrnett, Jeffrey W, MD;  [facility] ORS; General;  Laterality: Right;  . SENTINEL NODE BIOPSY Right 07/30/2017   Procedure: SENTINEL NODE BIOPSY;  Surgeon: Byrnett, Jeffrey W, MD;  Location: [facility]S;  Service: General;  Laterality: Right;  . SIMPLE MASTECTOMY WITH AXILLARY SENTINEL NODE BIOPSY Right 09/28/2017   Procedure: SIMPLE MASTECTOMY;  Surgeon: Byrnett, Jeffrey W, MD;  Location: [facility]S;  Service: General;  Laterality: Right;    Home Medications:  Allergies as of 11/07/2018      Reactions   Other Hives, Itching   [name]       Medication List       Accurate as of November 07, 2018 10:03 AM. Always use your most recent med list.        blood glucose meter kit and supplies Dispense based on patient and insurance preference. Use up to one time daily as directed. (FOR ICD-10 E10.9, E11.9).   gabapentin 400 MG capsule Commonly known as:  NEURONTIN Take 1 capsule (400 mg total) by mouth at bedtime.   letrozole 2.5 MG tablet Commonly known as:  FEMARA Take 1 tablet (2.5 mg total) by mouth daily.   metFORMIN 500 MG tablet Commonly known as:  GLUCOPHAGE Take 2 tablets (1,000 mg total) by mouth 2 (two) times daily with a meal.   triamterene-hydrochlorothiazide   37.5-25 MG tablet Commonly known as:  MAXZIDE-25 Take 1 tablet by mouth daily.       Allergies:  Allergies  Allergen Reactions  . Other Hives and Itching    [name]     Family History: Family History  Problem Relation Age of Onset  . Diabetes Father   . Stroke Father   . Breast cancer Mother 55  . Brain cancer Maternal Aunt 60  . Colon cancer Neg Hx      Social History:  reports that she quit smoking about 12 months ago. Her smoking use included cigarettes. She has a 6.25 pack-year smoking history. She has quit using smokeless tobacco. She reports current alcohol use. She reports that she does not use drugs.  ROS: UROLOGY Frequent Urination?: No Hard to postpone urination?: No Burning/pain with urination?: No Get up at night to urinate?: No Leakage of urine?: No Urine stream starts and stops?: No Trouble starting stream?: No Do you have to strain to urinate?: No Blood in urine?: No Urinary tract infection?: No Sexually transmitted disease?: No Injury to kidneys or bladder?: No Painful intercourse?: No Weak stream?: No Currently pregnant?: No Vaginal bleeding?: No Last menstrual period?: n  Gastrointestinal Nausea?: No Vomiting?: No Indigestion/heartburn?: No Diarrhea?: No Constipation?: No  Constitutional Fever: No Night sweats?: No Weight loss?: No Fatigue?: No  Skin Skin rash/lesions?: No Itching?: No  Eyes Blurred vision?: No Double vision?: No  Ears/Nose/Throat Sore throat?: No  Hematologic/Lymphatic Swollen glands?: No Easy bruising?: No  Cardiovascular Leg swelling?: No Chest pain?: No  Respiratory Cough?: No Shortness of breath?: No  Endocrine Excessive thirst?: No  Musculoskeletal Back pain?: No Joint pain?: No  Neurological Headaches?: No Dizziness?: No  Psychologic Depression?: No Anxiety?: No  Physical Exam: BP 125/85 (BP Location: Left Arm, Patient Position: Sitting, Cuff Size: Large)   Pulse 88   Ht 5' 6" (1.676 m)   Wt 246 lb 4.8 oz (111.7 kg)   LMP 01/31/2014 (Approximate) Comment: LMP was 3 years ago.  BMI 39.75 kg/m   Constitutional:  Alert and oriented, No acute distress. HEENT: [place] AT, moist mucus membranes.  Trachea midline, no masses. Cardiovascular: No clubbing, cyanosis, or edema. Respiratory: Normal respiratory effort, no increased work of breathing. GI:  Abdomen is soft, nontender, nondistended, no abdominal masses GU: No CVA tenderness Lymph: No cervical or inguinal lymphadenopathy. Skin: No rashes, bruises or suspicious lesions. Neurologic: Grossly intact, no focal deficits, moving all 4 extremities. Psychiatric: Normal mood and affect.  Laboratory Data:  Urinalysis Dipstick 2+ leukocytes/trace blood/nitrite positive Microscopy >30 WBC, no RBC  Assessment & Plan:   52-year-old female with significant pyuria and negative urine culture.  No significant voiding symptoms.  She has baseline urinary urgency which she states persist even after antibiotic treatment.  Recommend scheduling a renal ultrasound and cystoscopy for further evaluation.  Urine culture was repeated today.    Scott C Stoioff, MD  [facility] [address], [address] [place], [place] [address] [phone]  

## 2018-11-08 ENCOUNTER — Inpatient Hospital Stay: Payer: Medicaid Other

## 2018-11-08 ENCOUNTER — Inpatient Hospital Stay: Payer: Medicaid Other | Attending: Oncology

## 2018-11-08 DIAGNOSIS — Z452 Encounter for adjustment and management of vascular access device: Secondary | ICD-10-CM | POA: Diagnosis present

## 2018-11-08 DIAGNOSIS — C50411 Malignant neoplasm of upper-outer quadrant of right female breast: Secondary | ICD-10-CM | POA: Diagnosis present

## 2018-11-08 DIAGNOSIS — Z95828 Presence of other vascular implants and grafts: Secondary | ICD-10-CM

## 2018-11-08 MED ORDER — SODIUM CHLORIDE 0.9% FLUSH
10.0000 mL | INTRAVENOUS | Status: DC | PRN
  Administered 2018-11-08: 10 mL via INTRAVENOUS
  Filled 2018-11-08: qty 10

## 2018-11-08 MED ORDER — HEPARIN SOD (PORK) LOCK FLUSH 100 UNIT/ML IV SOLN
500.0000 [IU] | Freq: Once | INTRAVENOUS | Status: AC
  Administered 2018-11-08: 500 [IU] via INTRAVENOUS

## 2018-11-08 MED ORDER — HEPARIN SOD (PORK) LOCK FLUSH 100 UNIT/ML IV SOLN
INTRAVENOUS | Status: AC
  Filled 2018-11-08: qty 5

## 2018-11-11 ENCOUNTER — Telehealth: Payer: Self-pay | Admitting: Urology

## 2018-11-11 LAB — CULTURE, URINE COMPREHENSIVE

## 2018-11-11 MED ORDER — SULFAMETHOXAZOLE-TRIMETHOPRIM 800-160 MG PO TABS: 1.0000 | ORAL_TABLET | Freq: Two times a day (BID) | ORAL | 0 refills | Status: DC

## 2018-11-11 NOTE — Telephone Encounter (Signed)
Spoke with pt and made her aware of lab results. Pt agreed to medication this was sent to her pharmacy of choice. Nothing further is needed   Stoioff, Ronda Fairly, MD  [facility]        Urine culture was positive for infection. Please send in Rx Septra DS 1 twice daily x7 days

## 2018-11-21 ENCOUNTER — Ambulatory Visit
Admission: RE | Admit: 2018-11-21 | Discharge: 2018-11-21 | Disposition: A | Discharge status: Home / Self Care | Payer: Medicaid Other | Source: Ambulatory Visit | Attending: Urology | Admitting: Urology

## 2018-11-21 DIAGNOSIS — R8281 Pyuria: Secondary | ICD-10-CM | POA: Insufficient documentation

## 2018-12-13 ENCOUNTER — Encounter: Payer: Self-pay | Admitting: Urology

## 2018-12-13 ENCOUNTER — Ambulatory Visit (INDEPENDENT_AMBULATORY_CARE_PROVIDER_SITE_OTHER): Payer: Medicaid Other | Admitting: Urology

## 2018-12-13 VITALS — BP 134/86 | HR 114 | Ht 66.0 in | Wt 246.0 lb

## 2018-12-13 DIAGNOSIS — R3915 Urgency of urination: Secondary | ICD-10-CM

## 2018-12-13 DIAGNOSIS — N390 Urinary tract infection, site not specified: Secondary | ICD-10-CM

## 2018-12-13 DIAGNOSIS — R8281 Pyuria: Secondary | ICD-10-CM

## 2018-12-13 LAB — MICROSCOPIC EXAMINATION: RBC, UA: NONE SEEN /hpf (ref 0–2)

## 2018-12-13 LAB — URINALYSIS, COMPLETE
BILIRUBIN UA: NEGATIVE
Glucose, UA: NEGATIVE
Ketones, UA: NEGATIVE
Nitrite, UA: NEGATIVE
PROTEIN UA: NEGATIVE
RBC, UA: NEGATIVE
Specific Gravity, UA: 1.015 (ref 1.005–1.030)
Urobilinogen, Ur: 0.2 mg/dL (ref 0.2–1.0)
pH, UA: 5.5 (ref 5.0–7.5)

## 2018-12-13 MED ORDER — LIDOCAINE HCL URETHRAL/MUCOSAL 2 % EX GEL
1.0000 "application " | Freq: Once | CUTANEOUS | Status: AC
  Administered 2018-12-13: 1 via URETHRAL

## 2018-12-13 NOTE — Progress Notes (Signed)
   12/13/2018  3:28 PM   CC:  Chief Complaint  Patient presents with  . Cysto   HPI: Chelsea Hudson is a 53 y.o. female that presents today for a cystoscopy for pyuria and urinary urgency. She has a history of recurrent urinary tract infections that started following chemotherapy and radiation for breast cancer in 2018.  Denies urgency, frequency, flank/abdominal/pelvic pain, and hematuria.  US Renal 11/21/2018 was negative.  Blood pressure 134/86, pulse (!) 114, height 5\' 6"  (1.676 m), weight 246 lb (111.6 kg), last menstrual period 01/31/2014. NED. A&Ox3.   No respiratory distress   Abd soft, NT, ND Normal external genitalia with patent urethral meatus  Cystoscopy Procedure Note  Patient identification was confirmed, informed consent was obtained, and patient was prepped using Betadine solution.  Lidocaine jelly was administered per urethral meatus.    Procedure: - Flexible cystoscope introduced, without any difficulty.   - Thorough search of the bladder revealed:    normal urethral meatus    normal urothelium    no stones    Small area of mucousal erythema with edema on posterior bladder wall    no tumors    no urethral polyps    no trabeculation  - Ureteral orifices were normal in position and appearance.  Post-Procedure: - Patient tolerated the procedure well  Assessment/ Plan: Small area of erythema noted on cystoscopy. Follow up with CT scan to evaluate for possible colovesical fistula. If CT neg recc scheduling biopsy.    Abbie Sons, MD  I, Temidayo Atanda-Ogunleye , am acting as a scribe for Abbie Sons, MD  I, Abbie Sons, MD, have reviewed all documentation for this visit. The documentation on 12/13/18 for the exam, diagnosis, procedures, and orders are all accurate and complete.

## 2018-12-19 ENCOUNTER — Ambulatory Visit: Payer: Medicaid Other

## 2018-12-19 ENCOUNTER — Other Ambulatory Visit: Payer: Self-pay

## 2018-12-19 DIAGNOSIS — N390 Urinary tract infection, site not specified: Secondary | ICD-10-CM

## 2018-12-20 ENCOUNTER — Inpatient Hospital Stay: Payer: Medicaid Other

## 2018-12-20 ENCOUNTER — Inpatient Hospital Stay: Payer: Medicaid Other | Attending: Oncology

## 2018-12-20 ENCOUNTER — Ambulatory Visit
Admission: RE | Admit: 2018-12-20 | Discharge: 2018-12-20 | Disposition: A | Discharge status: Home / Self Care | Payer: Medicaid Other | Source: Ambulatory Visit | Attending: Urology | Admitting: Urology

## 2018-12-20 ENCOUNTER — Other Ambulatory Visit
Admission: RE | Admit: 2018-12-20 | Discharge: 2018-12-20 | Disposition: A | Discharge status: Home / Self Care | Payer: Medicaid Other | Source: Home / Self Care | Attending: Urology | Admitting: Urology

## 2018-12-20 DIAGNOSIS — C50411 Malignant neoplasm of upper-outer quadrant of right female breast: Secondary | ICD-10-CM | POA: Insufficient documentation

## 2018-12-20 DIAGNOSIS — Z452 Encounter for adjustment and management of vascular access device: Secondary | ICD-10-CM | POA: Diagnosis not present

## 2018-12-20 DIAGNOSIS — N390 Urinary tract infection, site not specified: Secondary | ICD-10-CM

## 2018-12-20 DIAGNOSIS — Z95828 Presence of other vascular implants and grafts: Secondary | ICD-10-CM

## 2018-12-20 HISTORY — DX: Type 2 diabetes mellitus without complications: E11.9

## 2018-12-20 LAB — CREATININE, SERUM
CREATININE: 0.76 mg/dL (ref 0.44–1.00)
GFR calc Af Amer: >60 mL/min (ref >60)

## 2018-12-20 MED ORDER — HEPARIN SOD (PORK) LOCK FLUSH 100 UNIT/ML IV SOLN
500.0000 [IU] | Freq: Once | INTRAVENOUS | Status: AC
  Administered 2018-12-20: 500 [IU] via INTRAVENOUS

## 2018-12-20 MED ORDER — IOHEXOL 300 MG/ML  SOLN
125.0000 mL | Freq: Once | INTRAMUSCULAR | Status: AC | PRN
  Administered 2018-12-20: 125 mL via INTRAVENOUS

## 2018-12-20 MED ORDER — SODIUM CHLORIDE 0.9% FLUSH
10.0000 mL | INTRAVENOUS | Status: DC | PRN
  Administered 2018-12-20: 10 mL via INTRAVENOUS
  Filled 2018-12-20: qty 10

## 2018-12-28 NOTE — Progress Notes (Incomplete)
12/30/2018  1:42 PM   Chelsea Hudson [birth year] [id number]  Referring provider: Mikey College, NP [place], [place] [address]  No chief complaint on file.   HPI: Chelsea Hudson is a 53 y.o. female with pyuria and urinary urgency and a history of recurrent urinary tract infections that started following treatment (chemotherapy & radiation) for breast cancer in 2018, who presents today for her CT scan's results.  The impression from her 12/20/2018 CT scan by Dr. Tery Sanfilippo is: 1. Imaging features highly suspicious for colovesical fistula between the proximal sigmoid colon and left superolateral bladder wall. This area of sigmoid colon has pericolonic edema/inflammation on a background of diverticular change compatible with diverticulitis. No evidence for perforation or abscess.  2. Evaluation for urinary stone disease is limited by early administration of intravenous contrast before noncontrast imaging was obtained.  3. 2 cm right adnexal cystic lesion, likely ovarian etiology, unchanged since 06/19/2017 this is been present since a scan from 05/02/2012 consistent with benign etiology.  ***  PMH: Past Medical History:  Diagnosis Date   Arthritis    SHOULDER   Cancer ([place]) 02/28/2017   INVASIVE MAMMARY CARCINOMA WITH MUCINOUS FEATURES.   Colonic diverticular abscess 06/21/2017   Colonoscopy 06/25/2017: No evidence of malignancy.   Diabetes mellitus without complication ([place])    Hypertension    Irregular heart beat    PT STATES IT "SKIPS A BEAT"    Obesity    Personal history of chemotherapy     Surgical History: Past Surgical History:  Procedure Laterality Date   ANKLE SURGERY     BREAST BIOPSY Right 02/28/2017   INVASIVE MAMMARY CARCINOMA WITH MUCINOUS FEATURES.   BREAST CYST ASPIRATION Right    NEG   COLONOSCOPY WITH PROPOFOL N/A 06/25/2017   Procedure: COLONOSCOPY WITH PROPOFOL;  Surgeon: Jonathon Bellows, MD;  Location: [facility] END[facility];  Service:  Gastroenterology;  Laterality: N/A;   MASTECTOMY, PARTIAL Right 07/30/2017   Procedure: MASTECTOMY PARTIAL;  Surgeon: Robert Bellow, MD;  Location: [facility] ORS;  Service: General;  Laterality: Right;   PORTACATH PLACEMENT Left 03/15/2017   Procedure: INSERTION PORT-A-CATH;  Surgeon: Robert Bellow, MD;  Location: [facility] ORS;  Service: General;  Laterality: Left;   RE-EXCISION OF BREAST LUMPECTOMY Right 08/17/2017    INVASIVE CARCINOMA EXTENDS TO NEW LATERAL MARGIN. /RE-EXCISION OF BREAST LUMPECTOMY;: Robert Bellow, MD;  [facility]; General;  Laterality: Right;   SENTINEL NODE BIOPSY Right 07/30/2017   Procedure: SENTINEL NODE BIOPSY;  Surgeon: Robert Bellow, MD;  Location: [facility] ORS;  Service: General;  Laterality: Right;   SIMPLE MASTECTOMY WITH AXILLARY SENTINEL NODE BIOPSY Right 09/28/2017   Procedure: SIMPLE MASTECTOMY;  Surgeon: Robert Bellow, MD;  Location: [facility];  Service: General;  Laterality: Right;    Home Medications:  Allergies as of 12/30/2018      Reactions   Other Hives, Itching   [name]       Medication List       Accurate as of December 28, 2018  1:42 PM. Always use your most recent med list.        blood glucose meter kit and supplies Dispense based on patient and insurance preference. Use up to one time daily as directed. (FOR ICD-10 E10.9, E11.9).   gabapentin 400 MG capsule Commonly known as:  NEURONTIN Take 1 capsule (400 mg total)  by mouth at bedtime.   letrozole 2.5 MG tablet Commonly known as:  FEMARA Take 1 tablet (2.5 mg total) by mouth daily.   metFORMIN 500 MG tablet Commonly known as:  GLUCOPHAGE Take 2 tablets (1,000 mg total) by mouth 2 (two) times daily with a meal.   sulfamethoxazole-trimethoprim 800-160 MG tablet Commonly known as:  BACTRIM DS,SEPTRA DS Take 1 tablet by mouth every 12 (twelve) hours.     triamterene-hydrochlorothiazide 37.5-25 MG tablet Commonly known as:  MAXZIDE-25 Take 1 tablet by mouth daily.       Allergies:  Allergies  Allergen Reactions   Other Hives and Itching    [name]     Family History: Family History  Problem Relation Age of Onset   Diabetes Father    Stroke Father    Breast cancer Mother 33   Brain cancer Maternal Aunt 11   Colon cancer Neg Hx     Social History:  reports that she quit smoking about 14 months ago. Her smoking use included cigarettes. She has a 6.25 pack-year smoking history. She has quit using smokeless tobacco. She reports current alcohol use. She reports that she does not use drugs.  ROS:                                        Physical Exam: LMP 01/31/2014 (Approximate) Comment: LMP was 3 years ago.  Constitutional: Well nourished. Alert and oriented, No acute distress. {HEENT: [place] AT, moist mucus membranes.  Trachea midline, no masses.} Cardiovascular: No clubbing, cyanosis, or edema. Respiratory: Normal respiratory effort, no increased work of breathing. {GI: Abdomen is soft, non tender, non distended, no abdominal masses. Liver and spleen not palpable.  No hernias appreciated.  Stool sample for occult testing is not indicated.} {GU: No CVA tenderness.  No bladder fullness or masses.  *** external genitalia, *** pubic hair distribution, no lesions.  Normal urethral meatus, no lesions, no prolapse, no discharge.   No urethral masses, tenderness and/or tenderness. No bladder fullness, tenderness or masses. *** vagina mucosa, *** estrogen effect, no discharge, no lesions, *** pelvic support, *** cystocele and *** rectocele noted.  No cervical motion tenderness.  Uterus is freely mobile and non-fixed.  No adnexal/parametria masses or tenderness noted.  Anus and perineum are without rashes or lesions.    *** } Skin: No rashes, bruises or suspicious lesions. {Lymph: No cervical or inguinal adenopathy.} Neurologic: Grossly intact, no focal deficits, moving all 4 extremities. Psychiatric: Normal mood and affect.   Laboratory Data: Lab Results  Component Value Date   WBC 8.1 12/22/2017   HGB 11.6 (L) 12/22/2017   HCT 35.4 12/22/2017   MCV 79.9 (L) 12/22/2017   PLT 345 12/22/2017   Lab Results  Component Value Date   CREATININE 0.76 12/20/2018   Lab Results  Component Value Date   HGBA1C 6.3 (A) 09/26/2018   Urinalysis    Component Value Date/Time   COLORURINE YELLOW 10/08/2018 1213   APPEARANCEUR Clear 12/13/2018 1502   LABSPEC 1.018 10/08/2018 1213   PHURINE 7.0 10/08/2018 1213   GLUCOSEU Negative 12/13/2018 1502   HGBUR 1+ (A) 10/08/2018 1213   BILIRUBINUR Negative 12/13/2018 [place] 10/08/2018 1213   PROTEINUR Negative 12/13/2018 1502   PROTEINUR TRACE (A) 10/08/2018 1213  UROBILINOGEN 0.2 10/08/2018 1147   NITRITE Negative 12/13/2018 1502   NITRITE NEGATIVE 10/08/2018 1213   LEUKOCYTESUR Trace (A) 12/13/2018 1502    Lab Results  Component Value Date   LABMICR See below: 12/13/2018   WBCUA 6-10 (A) 12/13/2018   RBCUA None seen 12/13/2018   LABEPIT 0-10 12/13/2018   MUCUS Present (A) 11/07/2018   BACTERIA Few (A) 12/13/2018    Pertinent Imaging: CLINICAL DATA:  Recurrent UTIs with gross hematuria. History of breast cancer.  EXAM: CT ABDOMEN AND PELVIS WITHOUT AND WITH CONTRAST  TECHNIQUE: Multidetector CT imaging of the abdomen and pelvis was performed following the standard protocol before and following the bolus administration of intravenous contrast.  CONTRAST:  160m OMNIPAQUE IOHEXOL 300 MG/ML  SOLN  COMPARISON:  06/19/2017  FINDINGS: Lower chest: Scarring noted anterior right middle lobe compatible with radiation fibrosis.  Hepatobiliary: No suspicious focal abnormality within the liver parenchyma. Gallbladder  unremarkable. No intrahepatic or extrahepatic biliary dilation.  Pancreas: No focal mass lesion. No dilatation of the main duct. No intraparenchymal cyst. No peripancreatic edema.  Spleen: No splenomegaly. No focal mass lesion.  Adrenals/Urinary Tract: No adrenal nodule or mass. Early excretion of contrast on the noncontrast study is compatible with early administration of intravenous contrast material. As such, tiny stones in the collecting systems and renal pelvis of each kidney could be obscured. Both ureters are mildly opacified to varying degrees and small/punctate ureteral stones could be obscured. No definite bladder stones.  After the main IV contrast bolus, no enhancing lesion is seen in either kidney. No soft tissue filling defect or wall thickening evident in either intrarenal collecting system or renal pelvis. Neither ureter well opacified but there is no focal hydroureter evident.  Delayed imaging through the bladder bladder wall thickening along the anterior aspect of the left dome well seen on sagittal image 83/18. This area of abnormal bladder wall is tethered to the proximal sigmoid colon via a soft tissue tract compatible with fistula (axial images 71-74 of series 4 and coronal images 82-85 of series 18).  Stomach/Bowel: Stomach is unremarkable. No gastric wall thickening. No evidence of outlet obstruction. Duodenum is normally positioned as is the ligament of Treitz. No small bowel wall thickening. No small bowel dilatation. The terminal ileum is normal. The appendix is normal. Diverticular changes are noted in the left colon in there is mild pericolonic edema/inflammation in the region of the proximal sigmoid segment. No evidence for extraluminal gas or abscess.  Vascular/Lymphatic: No abdominal aortic aneurysm. There is no gastrohepatic or hepatoduodenal ligament lymphadenopathy. No intraperitoneal or retroperitoneal lymphadenopathy. No  pelvic sidewall lymphadenopathy.  Reproductive: Uterus unremarkable. 2 cm cystic lesion posterior right adnexal space is stable since prior study. No left adnexal mass.  Other: No intraperitoneal free fluid.  Musculoskeletal: No worrisome lytic or sclerotic osseous abnormality. Old posterior right eleventh rib fracture evident.  IMPRESSION: 1. Imaging features highly suspicious for colovesical fistula between the proximal sigmoid colon and left superolateral bladder wall. This area of sigmoid colon has pericolonic edema/inflammation on a background of diverticular change compatible with diverticulitis. No evidence for perforation or abscess. 2. Evaluation for urinary stone disease is limited by early administration of intravenous contrast before noncontrast imaging was obtained. 3. 2 cm right adnexal cystic lesion, likely ovarian etiology, unchanged since 06/19/2017 this is been present since a scan from 05/02/2012 consistent with benign etiology.   Electronically Signed   By: EMisty StanleyM.D.   On: 12/20/2018 15:05  Assessment & Plan:  1. Colovesicula fistula *** - ***  No follow-ups on file.  [facility] [address], [place], [place] [address] [phone]  I, Adele Schilder, am acting as a scribe for John Giovanni, MD.    {Add Scribe Attestation Statement}

## 2018-12-30 ENCOUNTER — Telehealth: Payer: Self-pay | Admitting: Family Medicine

## 2018-12-30 ENCOUNTER — Ambulatory Visit: Payer: Medicaid Other | Admitting: Urology

## 2018-12-30 ENCOUNTER — Encounter: Payer: Self-pay | Admitting: Urology

## 2018-12-30 NOTE — Telephone Encounter (Signed)
Pt has question about gabapentin [phone]

## 2018-12-30 NOTE — Telephone Encounter (Signed)
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 The pt requesting you to write a letter stating to either appeal or discontinue the medication. Please advise

## 2018-12-30 NOTE — Telephone Encounter (Signed)
Gabapentin is refused due to seizure treatment.  This is not why patient is taking.  I will write a letter stating this and patient can appeal.  This is available to her in Mychart and I have printed/signed a copy.

## 2018-12-30 NOTE — Telephone Encounter (Signed)
The pt was notified and states she will get it off her mychart.

## 2018-12-30 NOTE — Telephone Encounter (Signed)
Attempted to contact the pt, no answer. LMOM to return my call.  

## 2018-12-31 ENCOUNTER — Ambulatory Visit: Payer: Medicaid Other | Admitting: Nurse Practitioner

## 2019-01-01 ENCOUNTER — Telehealth: Payer: Self-pay

## 2019-01-01 DIAGNOSIS — T451X5A Adverse effect of antineoplastic and immunosuppressive drugs, initial encounter: Principal | ICD-10-CM

## 2019-01-01 DIAGNOSIS — G620 Drug-induced polyneuropathy: Secondary | ICD-10-CM

## 2019-01-01 NOTE — Telephone Encounter (Signed)
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 Per the provider at [facility] the pt can go and be tested by Neurology to see if she got Neuropathy. She would like to proceed with the referral and request that we do this ASAP.

## 2019-01-01 NOTE — Telephone Encounter (Signed)
The pt is requesting that you also send a referral to [facility]. She's trying to get in with someone ASAP.

## 2019-01-01 NOTE — Telephone Encounter (Signed)
Referral is placed.

## 2019-01-02 ENCOUNTER — Ambulatory Visit: Payer: Medicaid Other | Admitting: Nurse Practitioner

## 2019-01-03 ENCOUNTER — Other Ambulatory Visit: Payer: Self-pay

## 2019-01-03 ENCOUNTER — Ambulatory Visit: Payer: Medicaid Other | Admitting: Nurse Practitioner

## 2019-01-03 ENCOUNTER — Encounter: Payer: Self-pay | Admitting: Nurse Practitioner

## 2019-01-03 VITALS — BP 118/75 | HR 96 | Temp 98.4°F | Ht 66.0 in | Wt 241.2 lb

## 2019-01-03 DIAGNOSIS — E785 Hyperlipidemia, unspecified: Secondary | ICD-10-CM | POA: Diagnosis not present

## 2019-01-03 DIAGNOSIS — G620 Drug-induced polyneuropathy: Secondary | ICD-10-CM

## 2019-01-03 DIAGNOSIS — E1165 Type 2 diabetes mellitus with hyperglycemia: Secondary | ICD-10-CM | POA: Diagnosis not present

## 2019-01-03 DIAGNOSIS — E1169 Type 2 diabetes mellitus with other specified complication: Secondary | ICD-10-CM

## 2019-01-03 DIAGNOSIS — I10 Essential (primary) hypertension: Secondary | ICD-10-CM

## 2019-01-03 DIAGNOSIS — T451X5A Adverse effect of antineoplastic and immunosuppressive drugs, initial encounter: Secondary | ICD-10-CM

## 2019-01-03 LAB — POCT GLYCOSYLATED HEMOGLOBIN (HGB A1C): Hemoglobin A1C: 6.1 % — AB (ref 4.0–5.6)

## 2019-01-03 MED ORDER — METFORMIN HCL 500 MG PO TABS: ORAL_TABLET | ORAL | 1 refills | Status: DC

## 2019-01-03 MED ORDER — TRIAMTERENE-HCTZ 37.5-25 MG PO TABS: 1.0000 | ORAL_TABLET | Freq: Every day | ORAL | 1 refills | Status: DC

## 2019-01-03 NOTE — Patient Instructions (Addendum)
Chelsea Hudson,   Thank you for coming in to clinic today.  1. REDUCE supper dose of Metformin to 500 mg.  Keep breakfast metformin to 1000 mg.  2. Continue to reduce gabapentin.  After 1 week, may consider stopping.  Please schedule a follow-up appointment with Cassell Smiles, AGNP. Return in about 3 months (around 04/03/2019) for diabetes, hypertension.  If you have any other questions or concerns, please feel free to call the clinic or send a message through [place]. You may also schedule an earlier appointment if necessary.  You will receive a survey after today's visit either digitally by e-mail or paper by [employer]. Your experiences and feedback matter to [place].  Please respond so we know how we are doing as we provide care for you.   Cassell Smiles, DNP, AGNP-BC Adult Gerontology Nurse Practitioner [place]

## 2019-01-03 NOTE — Progress Notes (Signed)
Subjective:    Patient ID: Chelsea Hudson, female    DOB: September 11, 1966, 53 y.o.   MRN: [id number]  Chelsea Hudson is a 53 y.o. female presenting on 01/03/2019 for Hypertension and Diabetes   HPI Diabetes Pt presents today for follow up of Type 2 diabetes mellitus. She is not checking CBG at home.  Patient did for about a month.   - Current diabetic medications include: metformin 1,000 mg bid - She is not currently symptomatic.  - She denies polydipsia, polyphagia, polyuria, headaches, diaphoresis, shakiness, chills, pain, numbness or tingling in extremities and changes in vision.   - Clinical course has been improving. - She  reports no regular exercise routine. - Her diet is moderate in salt, moderate in fat, and moderate in carbohydrates. - Weight trend: decreasing steadily  PREVENTION: Eye exam current (within one year): no Foot exam current (within one year): yes  Lipid/ASCVD risk reduction - on statin: not currently Kidney protection - on ace or arb: no Recent Labs    05/24/18 0948 09/26/18 1125 01/03/19 1534  HGBA1C 9.0* 6.3* 6.1*    Hypertension - She is not checking BP at home or outside of clinic.    - Current medications: triamterene-HCTZ 37.5-25 mg once daily, tolerating well without side effects - She is not currently symptomatic. - Pt denies headache, lightheadedness, dizziness, changes in vision, chest tightness/pressure, palpitations, leg swelling, sudden loss of speech or loss of consciousness.  Chemo-induced peripheral neuropathy Patient states DOT Physical now excludes approval for neuropathy.  Patient waiting on neurology appointment for evaluation.  Will need nerve conduction study to confirm diagnosis.  Patient describes her current neuropathy pain as not painful.  Fingers "get hot" not painful, not numb.  Patient has cut back on gabapentin in attempt to improve her chances at DOT physical approval.  Not noticing any changes in symptoms.  Social History     Tobacco Use  . Smoking status: Former Smoker    Packs/day: 0.25    Years: 25.00    Pack years: 6.25    Types: Cigarettes    Last attempt to quit: 10/20/2017    Years since quitting: 1.2  . Smokeless tobacco: Former Network engineer Use Topics  . Alcohol use: Yes    Comment: occas  . Drug use: No    Review of Systems Per HPI unless specifically indicated above     Objective:    BP 118/75 (BP Location: Left Arm, Patient Position: Sitting, Cuff Size: Large)   Pulse 96   Temp 98.4 F (36.9 C) (Oral)   Ht 5\' 6"  (1.676 m)   Wt 241 lb 3.2 oz (109.4 kg)   LMP 01/31/2014 (Approximate) Comment: LMP was 3 years ago.  BMI 38.93 kg/m   Wt Readings from Last 3 Encounters:  01/03/19 241 lb 3.2 oz (109.4 kg)  12/13/18 246 lb (111.6 kg)  11/07/18 246 lb 4.8 oz (111.7 kg)    Physical Exam Vitals signs reviewed.  Constitutional:      General: She is awake. She is not in acute distress.    Appearance: She is well-developed. She is morbidly obese.  HENT:     Head: Normocephalic and atraumatic.  Neck:     Musculoskeletal: Normal range of motion and neck supple.     Vascular: No carotid bruit.  Cardiovascular:     Rate and Rhythm: Normal rate and regular rhythm.     Pulses:          Radial pulses  are 2+ on the right side and 2+ on the left side.       Posterior tibial pulses are 1+ on the right side and 1+ on the left side.     Heart sounds: Normal heart sounds, S1 normal and S2 normal.  Pulmonary:     Effort: Pulmonary effort is normal. No respiratory distress.     Breath sounds: Normal breath sounds and air entry.  Skin:    General: Skin is warm and dry.     Capillary Refill: Capillary refill takes less than 2 seconds.  Neurological:     Mental Status: She is alert and oriented to person, place, and time. Mental status is at baseline.  Psychiatric:        Attention and Perception: Attention normal.        Mood and Affect: Mood and affect normal.        Behavior: Behavior  normal. Behavior is cooperative.        Thought Content: Thought content normal.        Judgment: Judgment normal.     Results for orders placed or performed in visit on 01/03/19  POCT glycosylated hemoglobin (Hb A1C)  Result Value Ref Range   Hemoglobin A1C 6.1 (A) 4.0 - 5.6 %   HbA1c POC (<> result, manual entry)     HbA1c, POC (prediabetic range)     HbA1c, POC (controlled diabetic range)        Assessment & Plan:   Problem List Items Addressed This Visit      Cardiovascular and Mediastinum   Essential hypertension Controlled hypertension.  BP goal < 130/80.  Pt is working on lifestyle modifications.  Taking medications tolerating well without side effects. No current complications.  Plan: 1. Continue taking triamterene-HCTZ 37.5-25 mg once daily 2. Obtain labs   3. Encouraged heart healthy diet and increasing exercise to 30 minutes most days of the week. 4. Check BP 1-2 x per week at home, keep log, and bring to clinic at next appointment. 5. Follow up 3 months.     Relevant Medications   triamterene-hydrochlorothiazide (MAXZIDE-25) 37.5-25 MG tablet   Other Relevant Orders   Comprehensive metabolic panel     Endocrine   Hyperlipidemia associated with type 2 diabetes mellitus ([place]) - Primary Check lipid panel.  Last lipids at goal.  Patient prefers not to start statin.  Labs today.  START statin if LDL > 70.  Encouraged for other ASCVD prevention, but patient declines.  Follow-up 3 months.   Relevant Medications   metFORMIN (GLUCOPHAGE) 500 MG tablet   Other Relevant Orders   POCT glycosylated hemoglobin (Hb A1C) (Completed)   Lipid panel   Comprehensive metabolic panel     Nervous and Auditory   Neuropathy due to chemotherapeutic drug [facility]) Continue with neurology referral.  Nerve conduction test ordered for official diagnostics vs waiver of diagnosis for DOT physical application. If abnormal, patient may qualify for disability.  Follow-up prn.   Relevant Orders    Nerve conduction test     Other   Controlled type 2 diabetes mellitus with hyperglycemia ([place]) Well-controlledDM with A1c improved from last visit and goal A1c < 7.0%. - Complications - peripheral neuropathy.  Plan:  1. Change therapy:  - REDUCE metformin to 1000 mg in am with breakfast, 500 mg at supper. 2. Encourage improved lifestyle: - low carb/low glycemic diet reinforced prior education - Increase physical activity to 30 minutes most days of the week.  Explained that increased  physical activity increases body's use of sugar for energy. 3. Check fasting am CBG and bring log to next visit for review 4. Continue ASA 5. Advised to schedule DM ophtho exam, send record. 6. Follow-up 3 months   Relevant Medications   metFORMIN (GLUCOPHAGE) 500 MG tablet   Other Relevant Orders   Lipid panel   Comprehensive metabolic panel      Meds ordered this encounter  Medications  . metFORMIN (GLUCOPHAGE) 500 MG tablet    Sig: Take 2 tablets (1,000 mg total) by mouth daily with breakfast AND 1 tablet (500 mg total) daily with supper.    Dispense:  270 tablet    Refill:  1    Order Specific Question:   Supervising Provider    Answer:   Olin Hauser [[id number]]  . triamterene-hydrochlorothiazide (MAXZIDE-25) 37.5-25 MG tablet    Sig: Take 1 tablet by mouth daily.    Dispense:  90 tablet    Refill:  1    Order Specific Question:   Supervising Provider    Answer:   Olin Hauser [[id number]]    Follow up plan: Return in about 3 months (around 04/03/2019) for diabetes, hypertension.  Cassell Smiles, DNP, AGPCNP-BC Adult Gerontology Primary Care Nurse Practitioner [facility] 01/03/2019, 3:23 PM

## 2019-01-06 ENCOUNTER — Encounter: Payer: Self-pay | Admitting: Nurse Practitioner

## 2019-01-10 ENCOUNTER — Other Ambulatory Visit: Payer: Self-pay | Admitting: Nurse Practitioner

## 2019-01-10 DIAGNOSIS — E785 Hyperlipidemia, unspecified: Principal | ICD-10-CM

## 2019-01-10 DIAGNOSIS — E1169 Type 2 diabetes mellitus with other specified complication: Secondary | ICD-10-CM

## 2019-01-11 LAB — COMPLETE METABOLIC PANEL WITH GFR
AG Ratio: 1.2 (calc) (ref 1.0–2.5)
ALT: 15 U/L (ref 6–29)
AST: 12 U/L (ref 10–35)
Albumin: 3.8 g/dL (ref 3.6–5.1)
Alkaline phosphatase (APISO): 92 U/L (ref 37–153)
BUN: 13 mg/dL (ref 7–25)
CO2: 26 mmol/L (ref 20–32)
Calcium: 9.5 mg/dL (ref 8.6–10.4)
Chloride: 101 mmol/L (ref 98–110)
Creat: 0.75 mg/dL (ref 0.50–1.05)
GFR, Est African American: 108 mL/min/{1.73_m2} (ref >=60)
GFR, Est Non African American: 93 mL/min/{1.73_m2} (ref >=60)
Globulin: 3.3 g/dL (calc) (ref 1.9–3.7)
Glucose, Bld: 110 mg/dL — ABNORMAL HIGH (ref 65–99)
Potassium: 3.5 mmol/L (ref 3.5–5.3)
Sodium: 138 mmol/L (ref 135–146)
Total Bilirubin: 0.3 mg/dL (ref 0.2–1.2)
Total Protein: 7.1 g/dL (ref 6.1–8.1)

## 2019-01-11 LAB — LIPID PANEL
Cholesterol: 158 mg/dL (ref <200)
HDL: 34 mg/dL — ABNORMAL LOW (ref >=50)
LDL Cholesterol (Calc): 105 mg/dL (calc) — ABNORMAL HIGH
Non-HDL Cholesterol (Calc): 124 mg/dL (calc) (ref <130)
Total CHOL/HDL Ratio: 4.6 (calc) (ref <5.0)
Triglycerides: 92 mg/dL (ref <150)

## 2019-01-13 MED ORDER — EZETIMIBE 10 MG PO TABS: 5.0000 mg | ORAL_TABLET | Freq: Every day | ORAL | 4 refills | Status: DC

## 2019-01-13 NOTE — Addendum Note (Signed)
Addended by: Cleaster Corin on: 01/13/2019 08:38 AM   Modules accepted: Orders

## 2019-01-14 ENCOUNTER — Other Ambulatory Visit: Payer: Self-pay

## 2019-01-14 ENCOUNTER — Encounter: Payer: Self-pay | Admitting: Urology

## 2019-01-14 ENCOUNTER — Ambulatory Visit: Payer: Medicaid Other | Admitting: Urology

## 2019-01-14 VITALS — BP 135/85 | HR 83 | Ht 66.0 in | Wt 244.7 lb

## 2019-01-14 DIAGNOSIS — N321 Vesicointestinal fistula: Secondary | ICD-10-CM

## 2019-01-14 DIAGNOSIS — N390 Urinary tract infection, site not specified: Secondary | ICD-10-CM | POA: Diagnosis not present

## 2019-01-14 NOTE — Progress Notes (Signed)
01/14/2019 2:41 PM   Chelsea Hudson 1966-05-26 [id number]  Referring provider: Mikey College, NP [place], [place] [address]  Chief Complaint  Patient presents with  . Results    CT results    HPI: 53 year old female initially seen for recurrent urinary tract infections.  She had a negative renal ultrasound and on cystoscopy had a area of erythematous mucosa/bullous edema on the posterior wall.  She presents today for review of her CT.  CT abdomen pelvis with/without contrast performed on 12/20/2018 showed a segment of bladder wall thickening along the anterior aspect of the left dome that was tethered to the proximal sigmoid colon via a soft tissue tract felt compatible with a colovesical fistula.   PMH: Past Medical History:  Diagnosis Date  . Arthritis    SHOULDER  . Cancer ([place]) 02/28/2017   INVASIVE MAMMARY CARCINOMA WITH MUCINOUS FEATURES.  . Colonic diverticular abscess 06/21/2017   Colonoscopy 06/25/2017: No evidence of malignancy.  . Diabetes mellitus without complication ([place])   . Hypertension   . Irregular heart beat    PT STATES IT "SKIPS A BEAT"   . Obesity   . Personal history of chemotherapy     Surgical History: Past Surgical History:  Procedure Laterality Date  . ANKLE SURGERY    . BREAST BIOPSY Right 02/28/2017   INVASIVE MAMMARY CARCINOMA WITH MUCINOUS FEATURES.  Marland Kitchen BREAST CYST ASPIRATION Right    NEG  . COLONOSCOPY WITH PROPOFOL N/A 06/25/2017   Procedure: COLONOSCOPY WITH PROPOFOL;  Surgeon: Jonathon Bellows, MD;  Location: [facility] ENDOSCOPY;  Service: Gastroenterology;  Laterality: N/A;  . MASTECTOMY, PARTIAL Right 07/30/2017   Procedure: MASTECTOMY PARTIAL;  Surgeon: Robert Bellow, MD;  Location: [facility] ORS;  Service: General;  Laterality: Right;  . PORTACATH PLACEMENT Left 03/15/2017   Procedure: INSERTION PORT-A-CATH;  Surgeon: Robert Bellow, MD;  Location: [facility] ORS;  Service: General;  Laterality: Left;  . RE-EXCISION OF BREAST  LUMPECTOMY Right 08/17/2017    INVASIVE CARCINOMA EXTENDS TO NEW LATERAL MARGIN. /RE-EXCISION OF BREAST LUMPECTOMY;: Byrnett, Forest Gleason, MD;  [facility] ORS; General;  Laterality: Right;  . SENTINEL NODE BIOPSY Right 07/30/2017   Procedure: SENTINEL NODE BIOPSY;  Surgeon: Robert Bellow, MD;  Location: [facility] ORS;  Service: General;  Laterality: Right;  . SIMPLE MASTECTOMY WITH AXILLARY SENTINEL NODE BIOPSY Right 09/28/2017   Procedure: SIMPLE MASTECTOMY;  Surgeon: Robert Bellow, MD;  Location: [facility] ORS;  Service: General;  Laterality: Right;    Home Medications:  Allergies as of 01/14/2019      Reactions   Other Hives, Itching   [name]       Medication List       Accurate as of January 14, 2019  2:41 PM. Always use your most recent med list.        blood glucose meter kit and supplies Dispense based on patient and insurance preference. Use up to one time daily as directed. (FOR ICD-10 E10.9, E11.9).   gabapentin 400 MG capsule Commonly known as:  NEURONTIN Take 1 capsule (400 mg total) by mouth at bedtime.   letrozole 2.5 MG tablet Commonly known as:  FEMARA Take 1 tablet (2.5 mg total) by mouth daily.   metFORMIN 500 MG tablet Commonly known as:  GLUCOPHAGE Take 2 tablets (1,000 mg total) by mouth daily with breakfast AND 1 tablet (500 mg total)  daily with supper.   triamterene-hydrochlorothiazide 37.5-25 MG tablet Commonly known as:  MAXZIDE-25 Take 1 tablet by mouth daily.       Allergies:  Allergies  Allergen Reactions  . Other Hives and Itching    [name]     Family History: Family History  Problem Relation Age of Onset  . Diabetes Father   . Stroke Father   . Breast cancer Mother 61  . Brain cancer Maternal Aunt 60  . Colon cancer Neg Hx     Social  History:  reports that she quit smoking about 14 months ago. Her smoking use included cigarettes. She has a 6.25 pack-year smoking history. She has quit using smokeless tobacco. She reports current alcohol use. She reports that she does not use drugs.  ROS: UROLOGY Frequent Urination?: No Hard to postpone urination?: Yes Burning/pain with urination?: No Get up at night to urinate?: No Leakage of urine?: No Urine stream starts and stops?: No Trouble starting stream?: No Do you have to strain to urinate?: No Blood in urine?: No Urinary tract infection?: No Sexually transmitted disease?: No Injury to kidneys or bladder?: No Painful intercourse?: No Weak stream?: No Currently pregnant?: No Vaginal bleeding?: No Last menstrual period?: n  Gastrointestinal Nausea?: No Vomiting?: No Indigestion/heartburn?: No Diarrhea?: No Constipation?: No  Constitutional Fever: No Night sweats?: No Weight loss?: No Fatigue?: No  Skin Skin rash/lesions?: No Itching?: No  Eyes Blurred vision?: No Double vision?: No  Ears/Nose/Throat Sore throat?: No Sinus problems?: No  Hematologic/Lymphatic Swollen glands?: No Easy bruising?: No  Cardiovascular Leg swelling?: No Chest pain?: No  Respiratory Cough?: No Shortness of breath?: No  Endocrine Excessive thirst?: No  Musculoskeletal Back pain?: No Joint pain?: No  Neurological Headaches?: No Dizziness?: No  Psychologic Depression?: No Anxiety?: No  Physical Exam: BP 135/85   Pulse 83   Ht 5' 6"  (1.676 m)   Wt 244 lb 11.2 oz (111 kg)   LMP 01/31/2014 (Approximate) Comment: LMP was 3 years ago.  SpO2 99%   BMI 39.50 kg/m   Constitutional:  Alert and oriented, No acute distress. HEENT: [place] AT, moist mucus membranes.  Trachea midline, no masses. Cardiovascular: No clubbing, cyanosis, or edema. Respiratory: Normal respiratory effort, no increased work of breathing. Skin: No rashes, bruises or suspicious  lesions. Neurologic: Grossly intact, no focal deficits, moving all 4 extremities. Psychiatric: Normal mood and affect.  Pertinent Imaging: CT  images personally reviewed  Assessment & Plan:   53 year old female with recurrent UTI and findings on CT and cystoscopy suspicious for colovesical fistula.  She does not have a history of diverticulitis.  Will refer to general surgery-Dr. Fleet Contras has previously performed her breast surgery.   Abbie Sons, [place] [address], [place], [place] [address] [phone]

## 2019-01-22 ENCOUNTER — Telehealth: Payer: Self-pay

## 2019-01-22 ENCOUNTER — Other Ambulatory Visit: Payer: Self-pay

## 2019-01-22 DIAGNOSIS — T451X5A Adverse effect of antineoplastic and immunosuppressive drugs, initial encounter: Principal | ICD-10-CM

## 2019-01-22 DIAGNOSIS — G620 Drug-induced polyneuropathy: Secondary | ICD-10-CM

## 2019-01-22 NOTE — Progress Notes (Signed)
Pt needs a  Electromyography (EMG) done to determine if she got Neuropathy due to chemotherapy per [facility] Neuro[facility]iology [phone] . If the test results is determine to be negative we will submit the results to DOT for clearance. If needed the patient will be referred to Occupational Therapy for the driving test.

## 2019-01-22 NOTE — Progress Notes (Signed)
Discussed with neurology at [facility].  No need for appointment until after EMG.  Placed order for test results to return to me.

## 2019-01-24 NOTE — Telephone Encounter (Signed)
Open in error

## 2019-01-29 ENCOUNTER — Inpatient Hospital Stay: Payer: Medicaid Other | Attending: Oncology

## 2019-01-29 ENCOUNTER — Other Ambulatory Visit: Payer: Self-pay

## 2019-01-29 DIAGNOSIS — C50411 Malignant neoplasm of upper-outer quadrant of right female breast: Secondary | ICD-10-CM | POA: Insufficient documentation

## 2019-01-29 DIAGNOSIS — Z95828 Presence of other vascular implants and grafts: Secondary | ICD-10-CM

## 2019-01-29 DIAGNOSIS — G620 Drug-induced polyneuropathy: Secondary | ICD-10-CM | POA: Insufficient documentation

## 2019-01-29 DIAGNOSIS — Z452 Encounter for adjustment and management of vascular access device: Secondary | ICD-10-CM | POA: Diagnosis present

## 2019-01-29 DIAGNOSIS — T451X5A Adverse effect of antineoplastic and immunosuppressive drugs, initial encounter: Secondary | ICD-10-CM

## 2019-01-29 MED ORDER — HEPARIN SOD (PORK) LOCK FLUSH 100 UNIT/ML IV SOLN
500.0000 [IU] | Freq: Once | INTRAVENOUS | Status: AC
  Administered 2019-01-29: 500 [IU] via INTRAVENOUS

## 2019-01-29 MED ORDER — SODIUM CHLORIDE 0.9% FLUSH
10.0000 mL | INTRAVENOUS | Status: DC | PRN
  Administered 2019-01-29: 10 mL via INTRAVENOUS
  Filled 2019-01-29: qty 10

## 2019-01-30 ENCOUNTER — Other Ambulatory Visit: Payer: Self-pay

## 2019-01-30 ENCOUNTER — Ambulatory Visit: Payer: Medicaid Other | Admitting: General Surgery

## 2019-01-30 ENCOUNTER — Encounter: Payer: Self-pay | Admitting: General Surgery

## 2019-01-30 VITALS — BP 154/90 | HR 108 | Temp 97.7°F | Resp 16 | Ht 67.0 in | Wt 242.0 lb

## 2019-01-30 DIAGNOSIS — N321 Vesicointestinal fistula: Secondary | ICD-10-CM | POA: Diagnosis not present

## 2019-01-30 NOTE — Patient Instructions (Signed)
Dr. Bary Castilla will give you a call in a few days to discuss the plan.

## 2019-01-30 NOTE — Progress Notes (Signed)
Patient ID: Chelsea Hudson, female   DOB: 15-Jan-1966, 53 y.o.   MRN: [id number]  Chief Complaint  Patient presents with  . Follow-up    Colovesical Fistula    HPI Chelsea Hudson is a 53 y.o. female. 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 CT scan 12/20/2018. The patient had developed a diverticular abscess during neoadjuvant chemotherapy in summer 2018 that resolved with antibiotic therapy.  She underwent a colonoscopy afterward showing some mild inflammation of the colon with slight narrowing, but no difficulty with scope passage.  Multiple diverticuli were identified.  She has been asymptomatic in regard to her bowels.  Since her last visit here she is undergone repeat axillary exploration with identification of 2 more lymph nodes.  She underwent chest, axilla and supraclavicular radiation at [facility].  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  She reports that she has intermittently noted pneumaturia, but no episodes in the last few weeks.  She is presently asymptomatic and not having any urinary symptoms.  Past Medical History:  Diagnosis Date  . Arthritis    SHOULDER  . Cancer ([place]) 02/28/2017   INVASIVE MAMMARY CARCINOMA WITH MUCINOUS FEATURES.  . Colonic diverticular abscess 06/21/2017   Colonoscopy 06/25/2017: No evidence of malignancy.  . Diabetes mellitus without complication ([place])   . Hypertension   . Irregular heart beat    PT STATES IT "SKIPS A BEAT"   . Obesity   . Personal history of chemotherapy     Past Surgical History:  Procedure Laterality Date  . ANKLE SURGERY    . BREAST BIOPSY Right 02/28/2017   INVASIVE MAMMARY CARCINOMA WITH MUCINOUS FEATURES.  Marland Kitchen BREAST CYST ASPIRATION Right    NEG  . COLONOSCOPY WITH PROPOFOL N/A 06/25/2017   Procedure: COLONOSCOPY WITH PROPOFOL;  Surgeon: Jonathon Bellows, MD;  Location: [facility] ENDOSCOPY;  Service: Gastroenterology;  Laterality: N/A;  . MASTECTOMY, PARTIAL Right 07/30/2017   Procedure: MASTECTOMY PARTIAL;   Surgeon: Robert Bellow, MD;  Location: [facility] ORS;  Service: General;  Laterality: Right;  . PORTACATH PLACEMENT Left 03/15/2017   Procedure: INSERTION PORT-A-CATH;  Surgeon: Robert Bellow, MD;  Location: [facility] ORS;  Service: General;  Laterality: Left;  . RE-EXCISION OF BREAST LUMPECTOMY Right 08/17/2017    INVASIVE CARCINOMA EXTENDS TO NEW LATERAL MARGIN. /RE-EXCISION OF BREAST LUMPECTOMY;: Delania Ferg, Forest Gleason, MD;  [facility] ORS; General;  Laterality: Right;  . SENTINEL NODE BIOPSY Right 07/30/2017   Procedure: SENTINEL NODE BIOPSY;  Surgeon: Robert Bellow, MD;  Location: [facility] ORS;  Service: General;  Laterality: Right;  . SIMPLE MASTECTOMY WITH AXILLARY SENTINEL NODE BIOPSY Right 09/28/2017   Procedure: SIMPLE MASTECTOMY;  Surgeon: Robert Bellow, MD;  Location: [facility] ORS;  Service: General;  Laterality: Right;    Family History  Problem Relation Age of Onset  . Diabetes Father   . Stroke Father   . Breast cancer Mother 48  . Brain cancer Maternal Aunt 60  . Colon cancer Neg Hx     Social History Social History   Tobacco Use  . Smoking status: Former Smoker    Packs/day: 0.25    Years: 25.00    Pack years: 6.25    Types: Cigarettes    Last attempt to quit: 10/20/2017    Years since quitting: 1.2  . Smokeless tobacco: Former Network engineer Use Topics  . Alcohol use: Yes    Comment: occas  . Drug use: No    Allergies  Allergen Reactions  .  Other Hives and Itching    [name]     Current Outpatient Medications  Medication Sig Dispense Refill  . blood glucose meter kit and supplies Dispense based on patient and insurance preference. Use up to one time daily as directed. (FOR ICD-10 E10.9, E11.9). 1 each 0  . gabapentin (NEURONTIN) 400 MG capsule Take 1 capsule (400 mg total) by mouth at bedtime. (Patient taking differently: Take 400 mg by mouth every other day. ) 90  capsule 3  . letrozole (FEMARA) 2.5 MG tablet Take 1 tablet (2.5 mg total) by mouth daily. 30 tablet 3  . metFORMIN (GLUCOPHAGE) 500 MG tablet Take 2 tablets (1,000 mg total) by mouth daily with breakfast AND 1 tablet (500 mg total) daily with supper. 270 tablet 1  . triamterene-hydrochlorothiazide (MAXZIDE-25) 37.5-25 MG tablet Take 1 tablet by mouth daily. 90 tablet 1   No current facility-administered medications for this visit.     Review of Systems Review of Systems  Constitutional: Negative.   Respiratory: Negative.   Cardiovascular: Negative.     Blood pressure (!) 154/90, pulse (!) 108, temperature 97.7 F (36.5 C), temperature source Temporal, resp. rate 16, height 5' 7"  (1.702 m), weight 242 lb (109.8 kg), last menstrual period 01/31/2014, SpO2 97 %.  Physical Exam Physical Exam Pulmonary:     Breath sounds: Normal breath sounds.  Neurological:     Mental Status: She is alert.     Data Reviewed Notes from urology dated January 14, 2019, John Giovanni, MD were reviewed.  Plastic surgery notes from [facility] dated August 19, 2018 reviewed with plans for a free flap reconstruction with the abdominal wall.  Weight loss goals reviewed.  CT of the abdomen and pelvis dated December 20, 2018 and June 19, 2017 reviewed.  Previously noted diverticular abscess has resolved.  Tethering of the dome of the bladder to the sigmoid colon suggestive of fistula.  Assessment Colovesical fistula, minimally symptomatic at present.  Plan Based on the upcoming plans for reconstructive surgery at [facility], her BMI and diabetes, I think she be best managed at a university setting.  Inquiry has been made for referral to the GI surgery department and the patient will be notified when this information is available.   HPI, Physical Exam, Assessment and Plan have been scribed under the direction and in the presence of Robert Bellow, MD. Jonnie Finner, CMA  I have completed the exam and reviewed  the above documentation for accuracy and completeness.  I agree with the above.  Haematologist has been used and any errors in dictation or transcription are unintentional.  Hervey Ard, M.D., F.A.C.S.  Forest Gleason Velmer Broadfoot 01/31/2019, 10:46 AM

## 2019-01-31 ENCOUNTER — Telehealth: Payer: Self-pay | Admitting: Nurse Practitioner

## 2019-01-31 ENCOUNTER — Inpatient Hospital Stay: Payer: Medicaid Other

## 2019-01-31 DIAGNOSIS — N321 Vesicointestinal fistula: Secondary | ICD-10-CM | POA: Insufficient documentation

## 2019-01-31 DIAGNOSIS — T451X5A Adverse effect of antineoplastic and immunosuppressive drugs, initial encounter: Principal | ICD-10-CM

## 2019-01-31 DIAGNOSIS — G620 Drug-induced polyneuropathy: Secondary | ICD-10-CM

## 2019-01-31 MED ORDER — GABAPENTIN 400 MG PO CAPS: 400.0000 mg | ORAL_CAPSULE | Freq: Every day | ORAL | 1 refills | Status: DC

## 2019-01-31 NOTE — Telephone Encounter (Signed)
Patient has questions about her nerve conduction study.  Patient is currently taking gabapentin every other day and had been suggested to trial off the medication before the study.  Patient has tried this and notes she has symptoms and is needing to continue.  Patient noted aching in right arm all the way down to hand.  But was not impacting fingers significantly at that time.    Reviewed with patient to continue gabapentin for symptom control.  STOP taking 24 hours prior to nerve conduction study.  - If positive peripheral neuropathy diagnosis, we can continue working toward DOT driving approval with an OT driving eval with [facility] OT dept.  Patient verbalizes understanding.

## 2019-01-31 NOTE — Telephone Encounter (Signed)
Pt have requested that you call her she did not say what she wanted. Pt call back # is  [phone]

## 2019-02-05 ENCOUNTER — Telehealth: Payer: Self-pay | Admitting: *Deleted

## 2019-02-05 DIAGNOSIS — N321 Vesicointestinal fistula: Secondary | ICD-10-CM

## 2019-02-05 NOTE — Telephone Encounter (Signed)
-----   Message from Robert Bellow, MD sent at 02/04/2019  9:51 AM EDT ----- Please contact the patient and let her know that I think she would have the best results for management of her fistula if she went to [place] and saw [employer].  He is my "go to " guy for difficult intestine/ bladder issues.  He did not have anyone to recommend at [facility]. I can talk to her if she would like the details. Thanks.

## 2019-02-05 NOTE — Telephone Encounter (Signed)
Notified patient as instructed, she wishes to be referred. Discussed process for referral to Dr Farrel Conners in [place], patient agrees. Records faxed. Aware they will call her for an appointment.

## 2019-02-05 NOTE — Telephone Encounter (Signed)
Documents faxed

## 2019-02-17 ENCOUNTER — Encounter: Payer: Self-pay | Admitting: *Deleted

## 2019-02-17 NOTE — Progress Notes (Signed)
Patient is scheduled to see Dr. Donia Ast on 03-17-19 at 10:30 am. The patient was made aware per his office.

## 2019-02-23 ENCOUNTER — Other Ambulatory Visit: Payer: Self-pay | Admitting: Oncology

## 2019-03-14 ENCOUNTER — Inpatient Hospital Stay: Payer: Medicaid Other

## 2019-03-14 ENCOUNTER — Inpatient Hospital Stay: Payer: Medicaid Other | Attending: Oncology

## 2019-03-23 ENCOUNTER — Other Ambulatory Visit: Payer: Self-pay | Admitting: Oncology

## 2019-03-25 ENCOUNTER — Other Ambulatory Visit: Payer: Self-pay | Admitting: Oncology

## 2019-04-11 ENCOUNTER — Ambulatory Visit: Payer: Medicaid Other | Admitting: Oncology

## 2019-04-14 MED ORDER — DEXTROSE 50 % IV SOLN
25.00 | INTRAVENOUS | Status: DC
Start: ? — End: 2019-04-14

## 2019-04-14 MED ORDER — TRIAMTERENE-HCTZ 37.5-25 MG PO TABS
1.00 | ORAL_TABLET | ORAL | Status: DC
Start: ? — End: 2019-04-14

## 2019-04-14 MED ORDER — GABAPENTIN 300 MG PO CAPS
300.00 | ORAL_CAPSULE | ORAL | Status: DC
Start: 2019-04-14 — End: 2019-04-14

## 2019-04-14 MED ORDER — DOCUSATE SODIUM 100 MG PO CAPS
100.00 | ORAL_CAPSULE | ORAL | Status: DC
Start: 2019-04-14 — End: 2019-04-14

## 2019-04-14 MED ORDER — GLUCOSE 40 % PO GEL
ORAL | Status: DC
Start: ? — End: 2019-04-14

## 2019-04-14 MED ORDER — LETROZOLE 2.5 MG PO TABS
2.50 | ORAL_TABLET | ORAL | Status: DC
Start: 2019-04-15 — End: 2019-04-14

## 2019-04-14 MED ORDER — GENERIC EXTERNAL MEDICATION
Status: DC
Start: ? — End: 2019-04-14

## 2019-04-14 MED ORDER — GLUCAGON HCL (DIAGNOSTIC) 1 MG IJ SOLR
1.00 | INTRAMUSCULAR | Status: DC
Start: ? — End: 2019-04-14

## 2019-04-14 MED ORDER — PHENOL 1.4 % MT LIQD
2.00 | OROMUCOSAL | Status: DC
Start: ? — End: 2019-04-14

## 2019-04-14 MED ORDER — MENTHOL 9.1 MG MT LOZG
1.00 | LOZENGE | OROMUCOSAL | Status: DC
Start: ? — End: 2019-04-14

## 2019-04-14 MED ORDER — INSULIN LISPRO 100 UNIT/ML ~~LOC~~ SOLN
0.00 | SUBCUTANEOUS | Status: DC
Start: 2019-04-14 — End: 2019-04-14

## 2019-04-14 MED ORDER — ACETAMINOPHEN 325 MG PO TABS
975.00 | ORAL_TABLET | ORAL | Status: DC
Start: 2019-04-14 — End: 2019-04-14

## 2019-04-14 MED ORDER — HEPARIN SODIUM (PORCINE) PF 5000 UNIT/0.5ML IJ SOLN
5000.00 | INTRAMUSCULAR | Status: DC
Start: 2019-04-14 — End: 2019-04-14

## 2019-04-14 MED ORDER — PROMETHAZINE HCL 25 MG/ML IJ SOLN
6.25 | INTRAMUSCULAR | Status: DC
Start: ? — End: 2019-04-14

## 2019-05-02 ENCOUNTER — Other Ambulatory Visit: Payer: Self-pay | Admitting: Oncology

## 2019-05-08 ENCOUNTER — Ambulatory Visit: Payer: Medicaid Other | Admitting: Hematology and Oncology

## 2019-06-10 ENCOUNTER — Encounter: Payer: Self-pay | Admitting: General Surgery

## 2019-06-10 NOTE — Progress Notes (Signed)
[facility]  [address], [address] [place], [place] [address] Phone: [phone]  Fax: [phone]   Clinic Day:  06/12/2019  Referring physician: Mikey College, *  Chief Complaint: Chelsea Hudson is a 53 y.o. female with stage IIIC right breast cancer who is seen for new patient assessment.   HPI: The patient presented with a palpable right breast mass with overlying skin retraction.  Mammogram and ultrasound on 02/09/2017 revealed an irregular 2.1 x 1.0 x 0.9 cm hypoechoic mass with a feeding vessel 11:00, 8 cm from the nipple.  There was overlying skin thickness.  Ultrasound of the right axilla revealed at least 3 bulky lymph nodes deep to the pectoralis (level 2 lymphadenopathy).  Lymph node measured 1.6 x 1.4 x 1.9 cm and 2 x 1.7 x 1.8 cm.   Biopsy on 02/28/2017 revealed invasive carcinoma with mucinous features.  Lymph node biopsy revealed metastatic disease.  Tumor was ER positive (90%), PR positive (90%) and HER-2/neu negative.  She was felt to have clinical stage T3N3aM0 breast cancer.  PET scan on 03/09/2017 revealed hypermetabolic right axillary/subpectoral adenopathy.  There was low-grade activity in the right upper breast likely at the postoperative site. Appearance was compatible with metastatic spread to right axillary/subpectoral lymph nodes.  There was some sigmoid colon diverticulosis, with faint inflammatory findings adjacent to the sigmoid colon proximally, and with accentuated activity in the involved segment of the sigmoid colon.   She received 4 cycles of AC (03/22/2017 - 05/02/2017) and 7 of 12 cycles of neoadjuvant Taxol (05/16/2017 - 06/27/2017).  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  She received adjuvant radiation at [facility] (completed 05/2018).  Femara Rx written on 01/28/2018, but not started till after radiation completed.  Patient underwent partial mastectomy and sentinel lymph node biopsy on  07/30/2017.  Pathology revealed a 1.8 cm invasive mammary carcinoma with mucinous features.  There was lymphovascular invasion.  Eight of 12 lymph nodes were positive for metastatic disease.    She required multiple excisions to obtain clear margins resulting in a full mastectomy on 09/28/2017.  She underwent complete axillary node dissection on 03/04/2018.  Level 2 and 3 lymph node dissection revealed 3 of 3 lymph nodes positive for carcinoma.  The largest tumor deposit was at least 20 mm.  Extracapsular extension was present < 1 mm beyond the lymph node capsule.  Surgical course was complicated by sepsis and an infected seroma.  Mammogram of the left breast on 10/09/2018 revealed no suspicious masses or asymmetries. No malignant calcifications.    The patient developed a diverticular abscess during neoadjuvant chemotherapy in summer 2018 that resolved with antibiotic therapy.  Colonoscopy on 06/25/2017 revealed diverticulosis in the sigmoid colon, localized mild inflammation in the sigmoid colon secondary to colitis, and a stricture in the sigmoid colon.  She developed recurrent UTIs and hematuria.  She was seen by Dr. John Giovanni on 11/07/2018. Renal ultrasound on 11/21/2018 was negative.  Abdomen and pelvis CT on 12/20/2018 revealed features highly suspicious for colovesical fistula between the proximal sigmoid colon and left superolateral bladder wall. This area of sigmoid colon had pericolonic edema/inflammation on a background of diverticular change compatible with diverticulitis. There was no perforation or abscess. There was a 2 cm right adnexal cystic lesion, likely ovarian etiology, unchanged since 06/19/2017 and present since 05/02/2012 c/w a benign etiology.  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 Audie Clear at [facility].  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 Her  abdominal pain and UTIs resolved. She denies any abdominal and urinary symptoms.  The patient was last seen in the medical oncology clinic by Dr. Grayland Ormond on 09/28/2019. At that time, she was tolerating letrozole well with no significant side effects. She denied any neurologic complaints. She felt well.  Symptomatically, the patient is doing well.  She notes right knee pain; she has to take her time going up and down stairs.  She has arthritis in her shoulder.  She notes hot flashes. 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 She is interested in seeing a neurologist.   She examines her breast monthly. She notes left arm "tingles from time to time".  She is postmenopausal.  Her last menstrual cycles was x 5 years ago.  She is not interested in taking any new medicine at this time.    Past Medical History:  Diagnosis Date  . Arthritis    SHOULDER  . Cancer ([place]) 02/28/2017   INVASIVE MAMMARY CARCINOMA WITH MUCINOUS FEATURES.  . Colonic diverticular abscess 06/21/2017   Colonoscopy 06/25/2017: No evidence of malignancy.  . Diabetes mellitus without complication ([place])   . Hypertension   . Irregular heart beat    PT STATES IT "SKIPS A BEAT"   . Obesity   . Personal history of chemotherapy     Past Surgical History:  Procedure Laterality Date  . ANKLE SURGERY    . BREAST BIOPSY Right 02/28/2017   INVASIVE MAMMARY CARCINOMA WITH MUCINOUS FEATURES.  Marland Kitchen BREAST CYST ASPIRATION Right    NEG  . COLONOSCOPY WITH PROPOFOL N/A 06/25/2017   Procedure: COLONOSCOPY WITH PROPOFOL;  Surgeon: Jonathon Bellows, MD;  Location: [facility] ENDOSCOPY;  Service: Gastroenterology;  Laterality: N/A;  . MASTECTOMY, PARTIAL Right 07/30/2017   Procedure: MASTECTOMY PARTIAL;  Surgeon: Robert Bellow, MD;  Location: [facility] ORS;  Service: General;  Laterality: Right;  . PORTACATH PLACEMENT Left 03/15/2017   Procedure: INSERTION PORT-A-CATH;  Surgeon: Robert Bellow, MD;   Location: [facility] ORS;  Service: General;  Laterality: Left;  . RE-EXCISION OF BREAST LUMPECTOMY Right 08/17/2017    INVASIVE CARCINOMA EXTENDS TO NEW LATERAL MARGIN. /RE-EXCISION OF BREAST LUMPECTOMY;: Byrnett, Forest Gleason, MD;  [facility] ORS; General;  Laterality: Right;  . SENTINEL NODE BIOPSY Right 07/30/2017   Procedure: SENTINEL NODE BIOPSY;  Surgeon: Robert Bellow, MD;  Location: [facility] ORS;  Service: General;  Laterality: Right;  . SIMPLE MASTECTOMY WITH AXILLARY SENTINEL NODE BIOPSY Right 09/28/2017   Procedure: SIMPLE MASTECTOMY;  Surgeon: Robert Bellow, MD;  Location: [facility] ORS;  Service: General;  Laterality: Right;    Family History  Problem Relation Age of Onset  . Diabetes Father   . Stroke Father   . Breast cancer Mother 46  . Brain cancer Maternal Aunt 60  . Colon cancer Neg Hx     Social History:  reports that she quit smoking about 19 months ago. Her smoking use included cigarettes. She has a 6.25 pack-year smoking history. She has quit using smokeless tobacco. She reports current alcohol use. She reports that she does not use drugs. She is a Administrator. She denies any exposure to any toxins and radiations. The patient is alone today.  Allergies:  Allergies  Allergen Reactions  . Other Hives and Itching    Patient states that she's allergic to an antibiotic but not sure which one. It was given to her  for infection     Current Medications: Current Outpatient Medications  Medication Sig Dispense Refill  . blood glucose meter kit and supplies Dispense based on patient and insurance preference. Use up to one time daily as directed. (FOR ICD-10 E10.9, E11.9). 1 each 0  . gabapentin (NEURONTIN) 400 MG capsule Take 1 capsule (400 mg total) by mouth at bedtime. 90 capsule 1  . letrozole (FEMARA) 2.5 MG tablet Take 1 tablet by mouth once daily 90 tablet 0  . metFORMIN (GLUCOPHAGE) 500 MG tablet Take 2 tablets (1,000 mg total) by mouth daily with breakfast AND 1 tablet (500 mg  total) daily with supper. 270 tablet 1  . triamterene-hydrochlorothiazide (MAXZIDE-25) 37.5-25 MG tablet Take 1 tablet by mouth daily. 90 tablet 1   No current facility-administered medications for this visit.     Review of Systems  Constitutional: Negative for chills, fever, malaise/fatigue and weight loss.       Doing well.  HENT: Negative for congestion, ear pain, hearing loss, sore throat and tinnitus.   Eyes: Negative for blurred vision and double vision.  Respiratory: Negative for cough, hemoptysis, sputum production and shortness of breath.   Cardiovascular: Negative for chest pain, palpitations and leg swelling.  Gastrointestinal: Negative for abdominal pain (resolved), blood in stool, constipation, diarrhea, melena, nausea and vomiting.  Genitourinary: Negative for dysuria, frequency, hematuria and urgency.       UTIs resolved. Last menstrual cycle was 5 years ago.   Musculoskeletal: Positive for joint pain (right > left knee; arthritis in shoulders). Negative for back pain and myalgias.  Skin: Negative for rash.  Neurological: Positive for tingling (left arm). Negative for dizziness, sensory change, weakness and headaches.       Neuropathy not improving.  Endo/Heme/Allergies: Does not bruise/bleed easily.       Hot flashes.  Psychiatric/Behavioral: Negative for depression and memory loss. The patient is not nervous/anxious and does not have insomnia.   All other systems reviewed and are negative.  Performance status (ECOG): 0  Vitals Blood pressure 121/77, pulse 86, temperature 97.6 F (36.4 C), temperature source Tympanic, resp. rate 18, height _0  (1.702 m), weight 239 lb 10.2 oz (108.7 kg), last menstrual period 01/31/2014, SpO2 100 %.   Physical Exam  Constitutional: She is oriented to person, place, and time. She appears well-developed and well-nourished. No distress.  HENT:  Head: Normocephalic and atraumatic.  Mouth/Throat: Oropharynx is clear and moist. No  oropharyngeal exudate.  Long red braids.  Eyes: Pupils are equal, round, and reactive to light. Conjunctivae and EOM are normal. No scleral icterus.  Brown eyes.  Neck: Normal range of motion. Neck supple. No JVD present.  Cardiovascular: Normal rate, regular rhythm and normal heart sounds.  No murmur heard. Pulmonary/Chest: Effort normal and breath sounds normal. No respiratory distress. She has no wheezes. She has no rales. She exhibits no tenderness.  RIGHT mastectomy with areas of hypopigmentation. No erythema or nodularity.  LEFT breast with fibrocystic changes at 2 o'clock.  Abdominal: Soft. Bowel sounds are normal. She exhibits no distension and no mass. There is no abdominal tenderness. There is no rebound and no guarding.  Musculoskeletal: Normal range of motion.        General: No tenderness or edema.  Lymphadenopathy:    She has no cervical adenopathy.    She has no axillary adenopathy.       Right: No supraclavicular adenopathy present.       Left: No supraclavicular adenopathy present.  Neurological: She  is alert and oriented to person, place, and time.  Skin: Skin is warm and dry. No rash noted. She is not diaphoretic. No erythema.  Psychiatric: She has a normal mood and affect. Her behavior is normal. Judgment and thought content normal.  Nursing note and vitals reviewed.   No visits with results within 3 Day(s) from this visit.  Latest known visit with results is:  Orders Only on 01/10/2019  Component Date Value Ref Range Status  . Cholesterol 01/10/2019 158  <200 mg/dL Final  . HDL 01/10/2019 34* > OR = 50 mg/dL Final  . Triglycerides 01/10/2019 92  <150 mg/dL Final  . LDL Cholesterol (Calc) 01/10/2019 105* mg/dL (calc) Final   Comment: Reference range: <100 . Desirable range <100 mg/dL for primary prevention;   <70 mg/dL for patients with CHD or diabetic patients  with > or = 2 CHD risk factors. Marland Kitchen LDL-C is now calculated using the Martin-Hopkins  calculation,  which is a validated novel method providing  better accuracy than the Friedewald equation in the  estimation of LDL-C.  Cresenciano Genre et al. Annamaria Helling. 6378;588(50): 2061-2068  (http://education.[facility].com/faq/FAQ164)   . Total CHOL/HDL Ratio 01/10/2019 4.6  <5.0 (calc) Final  . Non-HDL Cholesterol (Calc) 01/10/2019 124  <130 mg/dL (calc) Final   Comment: For patients with diabetes plus 1 major ASCVD risk  factor, treating to a non-HDL-C goal of <100 mg/dL  (LDL-C of <70 mg/dL) is considered a therapeutic  option.   . Glucose, Bld 01/10/2019 110* 65 - 99 mg/dL Final   Comment: .            Fasting reference interval . For someone without known diabetes, a glucose value between 100 and 125 mg/dL is consistent with prediabetes and should be confirmed with a follow-up test. .   . BUN 01/10/2019 13  7 - 25 mg/dL Final  . Creat 01/10/2019 0.75  0.50 - 1.05 mg/dL Final   Comment: For patients >74 years of age, the reference limit for Creatinine is approximately 13% higher for people identified as African-American. .   . GFR, Est Non African American 01/10/2019 93  > OR = 60 mL/min/1.8m Final  . GFR, Est African American 01/10/2019 108  > OR = 60 mL/min/1.794mFinal  . BUN/Creatinine Ratio 0227/74/1287OT APPLICABLE  6 - 22 (calc) Final  . Sodium 01/10/2019 138  135 - 146 mmol/L Final  . Potassium 01/10/2019 3.5  3.5 - 5.3 mmol/L Final  . Chloride 01/10/2019 101  98 - 110 mmol/L Final  . CO2 01/10/2019 26  20 - 32 mmol/L Final  . Calcium 01/10/2019 9.5  8.6 - 10.4 mg/dL Final  . Total Protein 01/10/2019 7.1  6.1 - 8.1 g/dL Final  . Albumin 01/10/2019 3.8  3.6 - 5.1 g/dL Final  . Globulin 01/10/2019 3.3  1.9 - 3.7 g/dL (calc) Final  . AG Ratio 01/10/2019 1.2  1.0 - 2.5 (calc) Final  . Total Bilirubin 01/10/2019 0.3  0.2 - 1.2 mg/dL Final  . Alkaline phosphatase (APISO) 01/10/2019 92  37 - 153 U/L Final  . AST 01/10/2019 12  10 - 35 U/L Final  . ALT 01/10/2019 15  6 - 29 U/L  Final    Assessment:  Chelsea LINDONs a [age].o. 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  Biopsy on 02/28/2017 revealed invasive carcinoma with mucinous features.  Lymph node biopsy revealed metastatic disease.  Tumor was ER positive (90%), PR positive (90%) and HER-2/neu negative.  She had clinical stage T3N3aM0 breast cancer.  PET scan on 03/09/2017 revealed hypermetabolic right axillary/subpectoral adenopathy.  There was low-grade activity in the right upper breast likely at the postoperative site.  Appearance was compatible with metastatic spread to right axillary/subpectoral lymph nodes.  There was some sigmoid colon diverticulosis, with faint inflammatory findings adjacent to the sigmoid colon proximally, and with accentuated activity in the involved segment of the sigmoid colon.   Partial mastectomy and sentinel lymph node biopsy on 07/30/2017 revealed a 1.8 cm invasive mammary carcinoma with mucinous features.  There was lymphovascular invasion.  Eight of 12 lymph nodes were positive for metastatic disease.   She required multiple excisions to obtain clear margins resulting in a full mastectomy on 09/28/2017.  She underwent complete axillary node dissection on 03/04/2018.  Level 2 and 3 lymph node dissection revealed 3 of 3 lymph nodes positive for carcinoma.  The largest tumor deposit was at least 20 mm.  Extracapsular extension was present < 1 mm beyond the lymph node capsule.   Mammogram and ultrasound on 02/09/2017 revealed an irregular 2.1 x 1.0 x 0.9 cm hypoechoic mass with a feeding vessel 11:00, 8 cm from the nipple.  There was overlying skin thickness.  Ultrasound of the right axilla revealed at least 3 bulky lymph nodes deep to the pectoralis (level 2 lymphadenopathy).  Lymph node measured 1.6 x 1.4 x 1.9 cm and 2 x 1.7 x 1.8 cm.  She received 4 cycles of AC (03/22/2017 - 05/02/2017)  and 7 of 12 cycles of neoadjuvant Taxol (05/16/2017 - 06/27/2017).  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  She received adjuvant radiation at [facility].  She began Femara in 05/2018.  Left mammogram on 10/09/2018 revealed no suspicious masses, asymmetries, or malignant calcifications.    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  Bone density on 07/17/2018 was normal with a T-score of -0.3 at the AP spine L1-L4.  Symptomatically, she is doing well.  She has a residual Taxol neuropathy.  Vasomotor symptoms are fading.  Plan: 1.   Labs today:  CBC with diff, CMP, CA27.29. 2.   Stage IIIC right breast cancer  Review entire medical history, diagnosis and management of breast cancer.  She underwent neoadjuvant chemotherapy (4 cycles of AC and 7 weeks of Taxol).  She has undergone completion mastectomy and radiation.  She began Femara on 05/2018.  Clinically, she is doing well with no evidence of recurrent disease.  Schedule left mammogram 10/13/2019. 3.   Chemotherapy neuropathy  Continue Neurontin.  Referral to neurology.    Patient previously seen by Dr Melrose Nakayama. 4.   Port flush today and in 2 months. 5.   RTC in 4 months for MD assessment and labs (CBC with diff, CMP, CA27.29).  I discussed the assessment and treatment plan with the patient.  The patient was provided an opportunity to ask questions and all were answered.  The patient agreed with the plan and demonstrated an understanding of the instructions.  The patient was advised to call back if the symptoms worsen or if the condition fails to improve as anticipated.  I provided 26 minutes of face-to-face time during this this encounter and > 50% was spent counseling as documented under my assessment and plan.    Lequita Asal, MD, PhD    06/12/2019, 9:09 AM  I, Selena Batten am acting as scribe for [employer]  Elmarie Mainland, MD, PhD.  I,  C. Mike Gip,  MD, have reviewed the above documentation for accuracy and completeness, and I agree with the above.

## 2019-06-11 ENCOUNTER — Other Ambulatory Visit: Payer: Self-pay | Admitting: Hematology and Oncology

## 2019-06-11 DIAGNOSIS — Z170 Estrogen receptor positive status [ER+]: Secondary | ICD-10-CM

## 2019-06-11 DIAGNOSIS — C50411 Malignant neoplasm of upper-outer quadrant of right female breast: Secondary | ICD-10-CM

## 2019-06-12 ENCOUNTER — Inpatient Hospital Stay (HOSPITAL_BASED_OUTPATIENT_CLINIC_OR_DEPARTMENT_OTHER): Payer: Medicaid Other | Admitting: Hematology and Oncology

## 2019-06-12 ENCOUNTER — Telehealth: Payer: Self-pay

## 2019-06-12 ENCOUNTER — Encounter: Payer: Self-pay | Admitting: Hematology and Oncology

## 2019-06-12 ENCOUNTER — Inpatient Hospital Stay: Payer: Medicaid Other | Attending: Hematology and Oncology

## 2019-06-12 ENCOUNTER — Telehealth: Payer: Self-pay | Admitting: Nurse Practitioner

## 2019-06-12 ENCOUNTER — Other Ambulatory Visit: Payer: Self-pay

## 2019-06-12 VITALS — BP 121/77 | HR 86 | Temp 97.6°F | Resp 18 | Ht 67.0 in | Wt 239.6 lb

## 2019-06-12 DIAGNOSIS — T451X5A Adverse effect of antineoplastic and immunosuppressive drugs, initial encounter: Secondary | ICD-10-CM

## 2019-06-12 DIAGNOSIS — M25561 Pain in right knee: Secondary | ICD-10-CM | POA: Diagnosis not present

## 2019-06-12 DIAGNOSIS — Z170 Estrogen receptor positive status [ER+]: Secondary | ICD-10-CM | POA: Insufficient documentation

## 2019-06-12 DIAGNOSIS — G620 Drug-induced polyneuropathy: Secondary | ICD-10-CM

## 2019-06-12 DIAGNOSIS — Z87891 Personal history of nicotine dependence: Secondary | ICD-10-CM | POA: Insufficient documentation

## 2019-06-12 DIAGNOSIS — M19019 Primary osteoarthritis, unspecified shoulder: Secondary | ICD-10-CM | POA: Diagnosis not present

## 2019-06-12 DIAGNOSIS — C773 Secondary and unspecified malignant neoplasm of axilla and upper limb lymph nodes: Secondary | ICD-10-CM | POA: Diagnosis not present

## 2019-06-12 DIAGNOSIS — C50411 Malignant neoplasm of upper-outer quadrant of right female breast: Secondary | ICD-10-CM

## 2019-06-12 DIAGNOSIS — Z7189 Other specified counseling: Secondary | ICD-10-CM

## 2019-06-12 LAB — CBC WITH DIFFERENTIAL/PLATELET
Abs Immature Granulocytes: 0.03 10*3/uL (ref 0.00–0.07)
Basophils Absolute: 0 10*3/uL (ref 0.0–0.1)
Basophils Relative: 0 %
Eosinophils Absolute: 0.1 10*3/uL (ref 0.0–0.5)
Eosinophils Relative: 1 %
HCT: 37.6 % (ref 36.0–46.0)
Hemoglobin: 11.8 g/dL — ABNORMAL LOW (ref 12.0–15.0)
Immature Granulocytes: 0 %
Lymphocytes Relative: 15 %
Lymphs Abs: 1.2 10*3/uL (ref 0.7–4.0)
MCH: 25.9 pg — ABNORMAL LOW (ref 26.0–34.0)
MCHC: 31.4 g/dL (ref 30.0–36.0)
MCV: 82.6 fL (ref 80.0–100.0)
Monocytes Absolute: 0.4 10*3/uL (ref 0.1–1.0)
Monocytes Relative: 5 %
Neutro Abs: 6.3 10*3/uL (ref 1.7–7.7)
Neutrophils Relative %: 79 %
Platelets: 376 10*3/uL (ref 150–400)
RBC: 4.55 MIL/uL (ref 3.87–5.11)
RDW: 17.2 % — ABNORMAL HIGH (ref 11.5–15.5)
WBC: 8.1 10*3/uL (ref 4.0–10.5)
nRBC: 0 % (ref 0.0–0.2)

## 2019-06-12 LAB — COMPREHENSIVE METABOLIC PANEL
ALT: 22 U/L (ref 0–44)
AST: 18 U/L (ref 15–41)
Albumin: 3.6 g/dL (ref 3.5–5.0)
Alkaline Phosphatase: 83 U/L (ref 38–126)
Anion gap: 10 (ref 5–15)
BUN: 16 mg/dL (ref 6–20)
CO2: 27 mmol/L (ref 22–32)
Calcium: 9.3 mg/dL (ref 8.9–10.3)
Chloride: 98 mmol/L (ref 98–111)
Creatinine, Ser: 1 mg/dL (ref 0.44–1.00)
GFR calc Af Amer: >60 mL/min (ref >60)
GFR calc non Af Amer: >60 mL/min (ref >60)
Glucose, Bld: 103 mg/dL — ABNORMAL HIGH (ref 70–99)
Potassium: 3.3 mmol/L — ABNORMAL LOW (ref 3.5–5.1)
Sodium: 135 mmol/L (ref 135–145)
Total Bilirubin: 0.4 mg/dL (ref 0.3–1.2)
Total Protein: 7.8 g/dL (ref 6.5–8.1)

## 2019-06-12 MED ORDER — HEPARIN SOD (PORK) LOCK FLUSH 100 UNIT/ML IV SOLN
500.0000 [IU] | Freq: Once | INTRAVENOUS | Status: AC
  Administered 2019-06-12: 10:00:00 500 [IU] via INTRAVENOUS

## 2019-06-12 MED ORDER — SODIUM CHLORIDE 0.9% FLUSH
10.0000 mL | INTRAVENOUS | Status: DC | PRN
  Administered 2019-06-12: 10 mL via INTRAVENOUS
  Filled 2019-06-12: qty 10

## 2019-06-12 NOTE — Telephone Encounter (Signed)
-----   Message from Lequita Asal, MD sent at 06/12/2019  1:48 PM EDT ----- Regarding: Please call patient.  Potassium is a little low.  Discuss potassium rich foods.  Send to PCP.  M ----- Message ----- From: Buel Ream, Lab In [place] Sent: 06/12/2019  10:24 AM EDT To: Lequita Asal, MD

## 2019-06-12 NOTE — Progress Notes (Signed)
No new changes noted today 

## 2019-06-12 NOTE — Telephone Encounter (Signed)
Hypokalemia likely worsened by triamterene-hctz.  Recommend changing BP medication.  Would prefer amlodipine 10 mg if needing to change.  Patient declines and wants to try high potassium foods first.  Reasonable, but explained that due to long history of borderline low K+, she should expect to change meds in future if not resolved.  Patient verbalizes understanding.

## 2019-06-12 NOTE — Telephone Encounter (Signed)
-----   Message from Arlan Organ, RN sent at 06/12/2019  2:35 PM EDT ----- Acquanetta Belling for you to review potassium per Dr. Kem Parkinson request

## 2019-06-12 NOTE — Telephone Encounter (Signed)
Left VM, with patient's permission, informing her of low potassium level. Educated on potassium rich foods and informed her that lab were forwarded to PCP. Number provided should any questions arise.

## 2019-06-13 LAB — CANCER ANTIGEN 27.29: CA 27.29: 21.1 U/mL (ref 0.0–38.6)

## 2019-06-16 ENCOUNTER — Other Ambulatory Visit: Payer: Self-pay | Admitting: Hematology and Oncology

## 2019-06-16 DIAGNOSIS — Z1231 Encounter for screening mammogram for malignant neoplasm of breast: Secondary | ICD-10-CM

## 2019-07-08 ENCOUNTER — Telehealth: Payer: Self-pay | Admitting: Nurse Practitioner

## 2019-07-08 NOTE — Telephone Encounter (Signed)
Pt called requesting refill on metformin

## 2019-07-22 ENCOUNTER — Other Ambulatory Visit: Payer: Self-pay | Admitting: Nurse Practitioner

## 2019-07-22 DIAGNOSIS — E1165 Type 2 diabetes mellitus with hyperglycemia: Secondary | ICD-10-CM

## 2019-07-27 ENCOUNTER — Other Ambulatory Visit: Payer: Self-pay | Admitting: Oncology

## 2019-08-13 ENCOUNTER — Other Ambulatory Visit: Payer: Self-pay

## 2019-08-14 ENCOUNTER — Inpatient Hospital Stay: Payer: Medicaid Other

## 2019-08-15 ENCOUNTER — Other Ambulatory Visit: Payer: Self-pay

## 2019-08-15 ENCOUNTER — Inpatient Hospital Stay: Payer: Medicaid Other | Attending: Hematology and Oncology

## 2019-08-15 DIAGNOSIS — C50411 Malignant neoplasm of upper-outer quadrant of right female breast: Secondary | ICD-10-CM | POA: Diagnosis present

## 2019-08-15 DIAGNOSIS — Z95828 Presence of other vascular implants and grafts: Secondary | ICD-10-CM

## 2019-08-15 DIAGNOSIS — Z452 Encounter for adjustment and management of vascular access device: Secondary | ICD-10-CM | POA: Insufficient documentation

## 2019-08-15 MED ORDER — SODIUM CHLORIDE 0.9% FLUSH
10.0000 mL | INTRAVENOUS | Status: DC | PRN
  Administered 2019-08-15: 10 mL via INTRAVENOUS
  Filled 2019-08-15: qty 10

## 2019-08-15 MED ORDER — HEPARIN SOD (PORK) LOCK FLUSH 100 UNIT/ML IV SOLN
500.0000 [IU] | Freq: Once | INTRAVENOUS | Status: AC
  Administered 2019-08-15: 15:00:00 500 [IU] via INTRAVENOUS

## 2019-08-21 ENCOUNTER — Other Ambulatory Visit: Payer: Self-pay | Admitting: Nurse Practitioner

## 2019-08-21 DIAGNOSIS — E1165 Type 2 diabetes mellitus with hyperglycemia: Secondary | ICD-10-CM

## 2019-08-21 NOTE — Telephone Encounter (Signed)
Pt called requesting 90 day supply on her  mefornin she said it was cheaper

## 2019-08-25 ENCOUNTER — Encounter: Payer: Self-pay | Admitting: Nurse Practitioner

## 2019-08-25 ENCOUNTER — Other Ambulatory Visit: Payer: Self-pay | Admitting: General Practice

## 2019-08-25 ENCOUNTER — Other Ambulatory Visit: Payer: Self-pay

## 2019-08-25 ENCOUNTER — Ambulatory Visit
Admission: RE | Admit: 2019-08-25 | Discharge: 2019-08-25 | Disposition: A | Discharge status: Home / Self Care | Payer: Disability Insurance | Source: Ambulatory Visit | Attending: General Practice | Admitting: General Practice

## 2019-08-25 ENCOUNTER — Ambulatory Visit (INDEPENDENT_AMBULATORY_CARE_PROVIDER_SITE_OTHER): Payer: Medicaid Other | Admitting: Nurse Practitioner

## 2019-08-25 ENCOUNTER — Telehealth: Payer: Self-pay | Admitting: Nurse Practitioner

## 2019-08-25 VITALS — BP 130/86 | HR 86 | Ht 67.0 in | Wt 241.6 lb

## 2019-08-25 DIAGNOSIS — G629 Polyneuropathy, unspecified: Secondary | ICD-10-CM

## 2019-08-25 DIAGNOSIS — G620 Drug-induced polyneuropathy: Secondary | ICD-10-CM | POA: Diagnosis not present

## 2019-08-25 DIAGNOSIS — G8918 Other acute postprocedural pain: Secondary | ICD-10-CM

## 2019-08-25 DIAGNOSIS — E1165 Type 2 diabetes mellitus with hyperglycemia: Secondary | ICD-10-CM | POA: Diagnosis not present

## 2019-08-25 DIAGNOSIS — I10 Essential (primary) hypertension: Secondary | ICD-10-CM

## 2019-08-25 LAB — POCT GLYCOSYLATED HEMOGLOBIN (HGB A1C): Hemoglobin A1C: 6.4 % — AB (ref 4.0–5.6)

## 2019-08-25 NOTE — Patient Instructions (Addendum)
Chelsea Hudson,   Thank you for coming in to clinic today.  1. Leonia Reader or quinoa instead of rice or potatoes.  You can mix either one 1/2 and 1/2 with rice.  Same cooking times.  2. Keep your plate 1/2 vegetables, 1/4 protein, 1/4 carbohydrate (starches - winter squash, bread, potatoes, rice, pasta, corn, cereals, grains, beans).  Please schedule a follow-up appointment. Return in about 3 months (around 11/25/2019) for diabetes.  If you have any other questions or concerns, please feel free to call the clinic or send a message through [facility]. You may also schedule an earlier appointment if necessary.  You will receive a survey after today's visit either digitally by e-mail or paper by [employer]. Your experiences and feedback matter to [place].  Please respond so we know how we are doing as we provide care for you.  Cassell Smiles, DNP, AGNP-BC Adult Gerontology Nurse Practitioner [place]

## 2019-08-25 NOTE — Progress Notes (Signed)
Subjective:    Patient ID: Chelsea Hudson, female    DOB: 07-Apr-1966, 53 y.o.   MRN: [id number]  Chelsea Hudson is a 53 y.o. female presenting on 08/25/2019 for Diabetes   HPI Diabetes Pt presents today for follow up of Type 2 diabetes mellitus. She is not checking CBG at home - Current diabetic medications include: metformin - She is not currently symptomatic.  - She denies polydipsia, polyphagia, polyuria, headaches, diaphoresis, shakiness, chills, pain, numbness or tingling in extremities and changes in vision.   - Clinical course has been worsening. - She  reports an exercise routine that includes low impact aerobics standing, 2 days per week feels like dancing. - Her diet is moderate in salt, moderate in fat, and moderate in carbohydrates. - Weight trend: fluctuating a bit  PREVENTION: Eye exam current (within one year): no Foot exam current (within one year): no Lipid/ASCVD risk reduction - on statin: yes Kidney protection - on ace or arb: no - urine microalbumin negative in past Recent Labs    09/26/18 1125 01/03/19 1534 08/25/19 1644  HGBA1C 6.3* 6.1* 6.4*   Neuropathy: Patient is pursuing disability testing.  Ongoing.  Today was second appointment for xrays of shoulders/ankles.  Diet: Breakfast: leftovers from supper (smaller portion) Lunch: Same leftovers from supper (larger portion) - Cooks generally meat and side. Snacks: Butterfingers   Hypertension - She is not checking BP at home or outside of clinic.    - Current medications: triamterene-hctz 37.5-25 mg once daily, tolerating well without side effects - She is not currently symptomatic. - Pt denies headache, lightheadedness, dizziness, changes in vision, chest tightness/pressure, palpitations, leg swelling, sudden loss of speech or loss of consciousness. - She  reports no regular exercise routine. - Her diet is moderate in salt, moderate in fat, and moderate in carbohydrates.   Social History    Tobacco Use  . Smoking status: Former Smoker    Packs/day: 0.25    Years: 25.00    Pack years: 6.25    Types: Cigarettes    Quit date: 10/20/2017    Years since quitting: 1.8  . Smokeless tobacco: Former Network engineer Use Topics  . Alcohol use: Yes    Comment: occas  . Drug use: No    Review of Systems Per HPI unless specifically indicated above     Objective:    BP 130/86 (BP Location: Left Arm, Patient Position: Sitting, Cuff Size: Large)   Pulse 86   Ht 5\' 7"  (1.702 m)   Wt 241 lb 9.6 oz (109.6 kg)   LMP 01/31/2014 (Approximate) Comment: LMP was 3 years ago.  BMI 37.84 kg/m   Wt Readings from Last 3 Encounters:  08/25/19 241 lb 9.6 oz (109.6 kg)  06/12/19 239 lb 10.2 oz (108.7 kg)  01/30/19 242 lb (109.8 kg)    Physical Exam Vitals signs reviewed.  Constitutional:      General: She is awake. She is not in acute distress.    Appearance: She is well-developed.  HENT:     Head: Normocephalic and atraumatic.     Right Ear: Tympanic membrane and ear canal normal.     Left Ear: Tympanic membrane and ear canal normal.     Nose: Nose normal.     Mouth/Throat:     Mouth: Mucous membranes are moist.     Pharynx: Oropharynx is clear.  Neck:     Musculoskeletal: Normal range of motion and neck supple.     Vascular:  No carotid bruit.  Cardiovascular:     Rate and Rhythm: Normal rate and regular rhythm.     Pulses:          Radial pulses are 2+ on the right side and 2+ on the left side.       Posterior tibial pulses are 1+ on the right side and 1+ on the left side.     Heart sounds: Normal heart sounds, S1 normal and S2 normal.  Pulmonary:     Effort: Pulmonary effort is normal. No respiratory distress.     Breath sounds: Normal breath sounds and air entry.  Abdominal:     General: Abdomen is flat.  Skin:    General: Skin is warm and dry.     Capillary Refill: Capillary refill takes less than 2 seconds.  Neurological:     General: No focal deficit present.      Mental Status: She is alert and oriented to person, place, and time. Mental status is at baseline.  Psychiatric:        Attention and Perception: Attention normal.        Mood and Affect: Mood and affect normal.        Behavior: Behavior normal. Behavior is cooperative.        Thought Content: Thought content normal.        Judgment: Judgment normal.    Results for orders placed or performed in visit on 08/25/19  POCT glycosylated hemoglobin (Hb A1C)  Result Value Ref Range   Hemoglobin A1C 6.4 (A) 4.0 - 5.6 %   HbA1c POC (<> result, manual entry)     HbA1c, POC (prediabetic range)     HbA1c, POC (controlled diabetic range)        Assessment & Plan:   Problem List Items Addressed This Visit      Cardiovascular and Mediastinum   Essential hypertension Controlled hypertension.  BP goal < 130/80.  Pt is not currently working on lifestyle modifications.  Taking medications tolerating well without side effects. Complication: obesity  Plan: 1. Continue taking medication without change. Refill provided 2. Obtain labs next visit  3. Encouraged heart healthy diet and increasing exercise to 30 minutes most days of the week. 4. Check BP 1-2 x per week at home, keep log, and bring to clinic at next appointment. 5. Follow up 3 months.     Relevant Medications   triamterene-hydrochlorothiazide (MAXZIDE-25) 37.5-25 MG tablet     Endocrine   Controlled type 2 diabetes mellitus with hyperglycemia ([place]) - Primary Controlled T2DM with A1c worsening from last visit, but remaining at goal A1c. Goal A1c < 7.0%. - Complications - dyslipidemia and hyperglycemia.  Plan:  1. Continue current therapy: metformin with renewed interest in lifestyle 2. Encourage improved lifestyle: - low carb/low glycemic diet reinforced prior education - Increase physical activity to 30 minutes most days of the week.  Explained that increased physical activity increases body's use of sugar for energy. 3. Check fasting  am CBG and bring log to next visit for review 4. Continue ASA, ACEi and Statin 5. Advised to schedule DM ophtho exam, send record. 6. Follow-up 3 months    Relevant Orders   POCT glycosylated hemoglobin (Hb A1C) (Completed)     Nervous and Auditory   Neuropathy due to chemotherapeutic drug (HCC) Stable without worsening.  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  Patient continues on gabapentin.  Refill provided.  Follow-up 3 months and prn.  Relevant Medications   gabapentin (NEURONTIN) 400 MG capsule      Meds ordered this encounter  Medications  . triamterene-hydrochlorothiazide (MAXZIDE-25) 37.5-25 MG tablet    Sig: Take 1 tablet by mouth daily.    Dispense:  90 tablet    Refill:  1    Order Specific Question:   Supervising Provider    Answer:   Olin Hauser [[id number]]  . gabapentin (NEURONTIN) 400 MG capsule    Sig: Take 1 capsule (400 mg total) by mouth at bedtime.    Dispense:  90 capsule    Refill:  1    Order Specific Question:   Supervising Provider    Answer:   Olin Hauser [[id number]]   Follow up plan: Return in about 3 months (around 11/25/2019) for diabetes.  Cassell Smiles, DNP, AGPCNP-BC Adult Gerontology Primary Care Nurse Practitioner [facility] 08/25/2019, 4:20 PM

## 2019-08-29 ENCOUNTER — Other Ambulatory Visit: Payer: Self-pay | Admitting: Nurse Practitioner

## 2019-08-29 DIAGNOSIS — E1165 Type 2 diabetes mellitus with hyperglycemia: Secondary | ICD-10-CM

## 2019-09-01 ENCOUNTER — Encounter: Payer: Self-pay | Admitting: Nurse Practitioner

## 2019-09-01 MED ORDER — GABAPENTIN 400 MG PO CAPS: 400.0000 mg | ORAL_CAPSULE | Freq: Every day | ORAL | 1 refills | Status: DC

## 2019-09-01 MED ORDER — TRIAMTERENE-HCTZ 37.5-25 MG PO TABS: 1.0000 | ORAL_TABLET | Freq: Every day | ORAL | 1 refills | Status: DC

## 2019-10-13 ENCOUNTER — Ambulatory Visit
Admission: RE | Admit: 2019-10-13 | Discharge: 2019-10-13 | Disposition: A | Discharge status: Home / Self Care | Payer: Medicaid Other | Source: Ambulatory Visit | Attending: Hematology and Oncology | Admitting: Hematology and Oncology

## 2019-10-13 ENCOUNTER — Other Ambulatory Visit: Payer: Self-pay

## 2019-10-13 DIAGNOSIS — Z1231 Encounter for screening mammogram for malignant neoplasm of breast: Secondary | ICD-10-CM | POA: Insufficient documentation

## 2019-10-13 HISTORY — DX: Personal history of irradiation: Z92.3

## 2019-10-14 ENCOUNTER — Other Ambulatory Visit: Payer: Self-pay

## 2019-10-14 NOTE — Progress Notes (Signed)
Patient states that Dr Grayland Ormond told her to start taking "an allergy pill" for sleep at night. Patient unsure the name of it. I asked her to bring the bottle or a picture of the bottle tomorrow so we can add it to her med list.  Otherwise she has no concerns at this time. Confirmed time of appt (8:30am) with her.

## 2019-10-15 ENCOUNTER — Inpatient Hospital Stay: Payer: Medicaid Other

## 2019-10-15 ENCOUNTER — Telehealth: Payer: Self-pay

## 2019-10-15 ENCOUNTER — Inpatient Hospital Stay: Payer: Medicaid Other | Attending: Hematology and Oncology | Admitting: Oncology

## 2019-10-15 VITALS — BP 139/86 | HR 87 | Temp 96.5°F | Ht 66.0 in | Wt 246.5 lb

## 2019-10-15 DIAGNOSIS — Z9011 Acquired absence of right breast and nipple: Secondary | ICD-10-CM | POA: Insufficient documentation

## 2019-10-15 DIAGNOSIS — N951 Menopausal and female climacteric states: Secondary | ICD-10-CM | POA: Insufficient documentation

## 2019-10-15 DIAGNOSIS — C50411 Malignant neoplasm of upper-outer quadrant of right female breast: Secondary | ICD-10-CM

## 2019-10-15 DIAGNOSIS — D649 Anemia, unspecified: Secondary | ICD-10-CM | POA: Diagnosis not present

## 2019-10-15 DIAGNOSIS — M25511 Pain in right shoulder: Secondary | ICD-10-CM | POA: Insufficient documentation

## 2019-10-15 DIAGNOSIS — G620 Drug-induced polyneuropathy: Secondary | ICD-10-CM | POA: Diagnosis not present

## 2019-10-15 DIAGNOSIS — Z87891 Personal history of nicotine dependence: Secondary | ICD-10-CM | POA: Diagnosis not present

## 2019-10-15 DIAGNOSIS — M25512 Pain in left shoulder: Secondary | ICD-10-CM | POA: Diagnosis not present

## 2019-10-15 DIAGNOSIS — Z170 Estrogen receptor positive status [ER+]: Secondary | ICD-10-CM | POA: Diagnosis not present

## 2019-10-15 DIAGNOSIS — R202 Paresthesia of skin: Secondary | ICD-10-CM | POA: Insufficient documentation

## 2019-10-15 DIAGNOSIS — M25561 Pain in right knee: Secondary | ICD-10-CM | POA: Insufficient documentation

## 2019-10-15 DIAGNOSIS — R600 Localized edema: Secondary | ICD-10-CM | POA: Diagnosis not present

## 2019-10-15 LAB — CBC WITH DIFFERENTIAL/PLATELET
Abs Immature Granulocytes: 0.03 10*3/uL (ref 0.00–0.07)
Basophils Absolute: 0 10*3/uL (ref 0.0–0.1)
Basophils Relative: 0 %
Eosinophils Absolute: 0.1 10*3/uL (ref 0.0–0.5)
Eosinophils Relative: 1 %
HCT: 36.7 % (ref 36.0–46.0)
Hemoglobin: 11.8 g/dL — ABNORMAL LOW (ref 12.0–15.0)
Immature Granulocytes: 0 %
Lymphocytes Relative: 14 %
Lymphs Abs: 1.2 10*3/uL (ref 0.7–4.0)
MCH: 25.9 pg — ABNORMAL LOW (ref 26.0–34.0)
MCHC: 32.2 g/dL (ref 30.0–36.0)
MCV: 80.5 fL (ref 80.0–100.0)
Monocytes Absolute: 0.4 10*3/uL (ref 0.1–1.0)
Monocytes Relative: 4 %
Neutro Abs: 6.8 10*3/uL (ref 1.7–7.7)
Neutrophils Relative %: 81 %
Platelets: 359 10*3/uL (ref 150–400)
RBC: 4.56 MIL/uL (ref 3.87–5.11)
RDW: 17.9 % — ABNORMAL HIGH (ref 11.5–15.5)
WBC: 8.6 10*3/uL (ref 4.0–10.5)
nRBC: 0 % (ref 0.0–0.2)

## 2019-10-15 LAB — COMPREHENSIVE METABOLIC PANEL
ALT: 18 U/L (ref 0–44)
AST: 18 U/L (ref 15–41)
Albumin: 3.5 g/dL (ref 3.5–5.0)
Alkaline Phosphatase: 92 U/L (ref 38–126)
Anion gap: 9 (ref 5–15)
BUN: 14 mg/dL (ref 6–20)
CO2: 24 mmol/L (ref 22–32)
Calcium: 9 mg/dL (ref 8.9–10.3)
Chloride: 100 mmol/L (ref 98–111)
Creatinine, Ser: 0.84 mg/dL (ref 0.44–1.00)
GFR calc Af Amer: >60 mL/min (ref >60)
GFR calc non Af Amer: >60 mL/min (ref >60)
Glucose, Bld: 164 mg/dL — ABNORMAL HIGH (ref 70–99)
Potassium: 3.4 mmol/L — ABNORMAL LOW (ref 3.5–5.1)
Sodium: 133 mmol/L — ABNORMAL LOW (ref 135–145)
Total Bilirubin: 0.3 mg/dL (ref 0.3–1.2)
Total Protein: 7.8 g/dL (ref 6.5–8.1)

## 2019-10-15 MED ORDER — SODIUM CHLORIDE 0.9% FLUSH
10.0000 mL | INTRAVENOUS | Status: DC | PRN
  Administered 2019-10-15: 10 mL via INTRAVENOUS
  Filled 2019-10-15: qty 10

## 2019-10-15 MED ORDER — HEPARIN SOD (PORK) LOCK FLUSH 100 UNIT/ML IV SOLN
500.0000 [IU] | Freq: Once | INTRAVENOUS | Status: AC
  Administered 2019-10-15: 500 [IU] via INTRAVENOUS

## 2019-10-15 NOTE — Progress Notes (Signed)
[facility]  [address], [address] [place], [place] [address] Phone: [phone]  Fax: [phone]   Clinic Day:  10/15/2019  Referring physician: Mikey College, *  Chief Complaint: Chelsea Hudson is a 53 y.o. female with stage IIIC right breast cancer who is seen for follow-up.   HPI: The patient presented with a palpable right breast mass with overlying skin retraction.  Mammogram and ultrasound on 02/09/2017 revealed an irregular 2.1 x 1.0 x 0.9 cm hypoechoic mass with a feeding vessel 11:00, 8 cm from the nipple.  There was overlying skin thickness.  Ultrasound of the right axilla revealed at least 3 bulky lymph nodes deep to the pectoralis (level 2 lymphadenopathy).  Lymph node measured 1.6 x 1.4 x 1.9 cm and 2 x 1.7 x 1.8 cm.   Biopsy on 02/28/2017 revealed invasive carcinoma with mucinous features.  Lymph node biopsy revealed metastatic disease.  Tumor was ER positive (90%), PR positive (90%) and HER-2/neu negative.  She was felt to have clinical stage T3N3aM0 breast cancer.  PET scan on 03/09/2017 revealed hypermetabolic right axillary/subpectoral adenopathy.  There was low-grade activity in the right upper breast likely at the postoperative site. Appearance was compatible with metastatic spread to right axillary/subpectoral lymph nodes.  There was some sigmoid colon diverticulosis, with faint inflammatory findings adjacent to the sigmoid colon proximally, and with accentuated activity in the involved segment of the sigmoid colon.   She received 4 cycles of AC (03/22/2017 - 05/02/2017) and 7 of 12 cycles of neoadjuvant Taxol (05/16/2017 - 06/27/2017).  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  She received adjuvant radiation at [facility] (completed 05/2018).  Femara Rx written on 01/28/2018, but not started till after radiation completed.  Patient underwent partial mastectomy and sentinel lymph node biopsy on 07/30/2017.   Pathology revealed a 1.8 cm invasive mammary carcinoma with mucinous features.  There was lymphovascular invasion.  Eight of 12 lymph nodes were positive for metastatic disease.    She required multiple excisions to obtain clear margins resulting in a full mastectomy on 09/28/2017.  She underwent complete axillary node dissection on 03/04/2018.  Level 2 and 3 lymph node dissection revealed 3 of 3 lymph nodes positive for carcinoma.  The largest tumor deposit was at least 20 mm.  Extracapsular extension was present < 1 mm beyond the lymph node capsule.  Surgical course was complicated by sepsis and an infected seroma.  Mammogram of the left breast on 10/09/2018 revealed no suspicious masses or asymmetries. No malignant calcifications.    The patient developed a diverticular abscess during neoadjuvant chemotherapy in summer 2018 that resolved with antibiotic therapy.  Colonoscopy on 06/25/2017 revealed diverticulosis in the sigmoid colon, localized mild inflammation in the sigmoid colon secondary to colitis, and a stricture in the sigmoid colon.  She developed recurrent UTIs and hematuria.  She was seen by Dr. John Giovanni on 11/07/2018. Renal ultrasound on 11/21/2018 was negative.  Abdomen and pelvis CT on 12/20/2018 revealed features highly suspicious for colovesical fistula between the proximal sigmoid colon and left superolateral bladder wall. This area of sigmoid colon had pericolonic edema/inflammation on a background of diverticular change compatible with diverticulitis. There was no perforation or abscess. There was a 2 cm right adnexal cystic lesion, likely ovarian etiology, unchanged since 06/19/2017 and present since 05/02/2012 c/w a benign etiology.  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 Audie Clear at [facility].  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 Her abdominal pain and  UTIs resolved. She denies any abdominal and urinary symptoms.  The patient was last seen in the medical oncology clinic by Dr. Mike Gip on 06/12/2019.  She denied any significant side effects.  Had mild left arm "tingling".  She tolerated letrozole well without any side effects.  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  Has mild hot flashes.  Has chemotherapy-induced neuropathy that is stable.  Currently on gabapentin.  In the interim, she has been doing "fairly well".  Bilateral lower extremity neuropathy is stable on gabapentin.  Notes chronic large joint pain specifically in her right knee.  Has intermittent bilateral arthritis in shoulders.  Has occasional hot flashes.  Notes worsening right arm tingling sensation from shoulder to fingertips.  Denies any interval infection, fever, cough, shortness of breath, chest pain, urinary complaints, diarrhea or constipation.  Past Medical History:  Diagnosis Date   Arthritis    SHOULDER   Breast cancer ([place]) 02/2017   rt breast   Cancer ([place]) 02/28/2017   INVASIVE MAMMARY CARCINOMA WITH MUCINOUS FEATURES.   Colonic diverticular abscess 06/21/2017   Colonoscopy 06/25/2017: No evidence of malignancy.   Diabetes mellitus without complication ([place])    Hypertension    Irregular heart beat    PT STATES IT "SKIPS A BEAT"    Obesity    Personal history of chemotherapy    Personal history of radiation therapy     Past Surgical History:  Procedure Laterality Date   ANKLE SURGERY     BREAST BIOPSY Right 02/28/2017   INVASIVE MAMMARY CARCINOMA WITH MUCINOUS FEATURES.   BREAST CYST ASPIRATION Right    NEG   COLONOSCOPY WITH PROPOFOL N/A 06/25/2017   Procedure: COLONOSCOPY WITH PROPOFOL;  Surgeon: Jonathon Bellows, MD;  Location: [facility] ENDOSCOPY;  Service: Gastroenterology;  Laterality: N/A;   MASTECTOMY, PARTIAL Right 07/30/2017   Procedure: MASTECTOMY PARTIAL;  Surgeon: Robert Bellow, MD;  Location: [facility] ORS;  Service: General;   Laterality: Right;   PORTACATH PLACEMENT Left 03/15/2017   Procedure: INSERTION PORT-A-CATH;  Surgeon: Robert Bellow, MD;  Location: [facility] ORS;  Service: General;  Laterality: Left;   RE-EXCISION OF BREAST LUMPECTOMY Right 08/17/2017    INVASIVE CARCINOMA EXTENDS TO NEW LATERAL MARGIN. /RE-EXCISION OF BREAST LUMPECTOMY;: Byrnett, Forest Gleason, MD;  [facility] ORS; General;  Laterality: Right;   SENTINEL NODE BIOPSY Right 07/30/2017   Procedure: SENTINEL NODE BIOPSY;  Surgeon: Robert Bellow, MD;  Location: [facility] ORS;  Service: General;  Laterality: Right;   SIMPLE MASTECTOMY WITH AXILLARY SENTINEL NODE BIOPSY Right 09/28/2017   Procedure: SIMPLE MASTECTOMY;  Surgeon: Robert Bellow, MD;  Location: [facility] ORS;  Service: General;  Laterality: Right;    Family History  Problem Relation Age of Onset   Diabetes Father    Stroke Father    Breast cancer Mother 32   Brain cancer Maternal Aunt 60   Colon cancer Neg Hx     Social History:  reports that she quit smoking about 1 years ago. Her smoking use included cigarettes. She has a 6.25 pack-year smoking history. She has quit using smokeless tobacco. She reports current alcohol use. She reports that she does not use drugs. She is a Administrator. She denies any exposure to any toxins and radiations. The patient is alone today.  Allergies:  Allergies  Allergen Reactions   Other Hives and Itching    Patient states that she's allergic to an antibiotic  but not sure which one. It was given to her for infection     Current Medications: Current Outpatient Medications  Medication Sig Dispense Refill   blood glucose meter kit and supplies Dispense based on patient and insurance preference. Use up to one time daily as directed. (FOR ICD-10 E10.9, E11.9). 1 each 0   gabapentin (NEURONTIN) 400 MG capsule Take 1 capsule (400 mg total) by mouth at bedtime. 90 capsule 1   letrozole (FEMARA) 2.5 MG tablet Take 1 tablet by mouth once daily 90 tablet  3   metFORMIN (GLUCOPHAGE) 500 MG tablet TAKE 2 TABLETS BY MOUTH ONCE DAILY WITH BREAKFAST AND 1 WITH SUPPER. PATIENT NEEDS AN APPOINTMENT IN ORDER TO GET REFILLS. 90 tablet 1   triamterene-hydrochlorothiazide (MAXZIDE-25) 37.5-25 MG tablet Take 1 tablet by mouth daily. 90 tablet 1   No current facility-administered medications for this visit.    Facility-Administered Medications Ordered in Other Visits  Medication Dose Route Frequency Provider Last Rate Last Dose   sodium chloride flush (NS) 0.9 % injection 10 mL  10 mL Intravenous PRN Jacquelin Hawking, NP   10 mL at 10/15/19 5916    Review of Systems  Constitutional: Negative.  Negative for chills, fever, malaise/fatigue and weight loss.  HENT: Negative for congestion, ear pain and tinnitus.   Eyes: Negative.  Negative for blurred vision and double vision.  Respiratory: Negative.  Negative for cough, sputum production and shortness of breath.   Cardiovascular: Negative.  Negative for chest pain, palpitations and leg swelling.  Gastrointestinal: Negative.  Negative for abdominal pain, constipation, diarrhea, nausea and vomiting.  Genitourinary: Negative for dysuria, frequency and urgency.  Musculoskeletal: Positive for joint pain. Negative for back pain and falls.       Right arm increased tingling and muscle aches  Skin: Negative.  Negative for rash.  Neurological: Positive for tingling and sensory change. Negative for weakness and headaches.  Endo/Heme/Allergies: Negative.  Does not bruise/bleed easily.  Psychiatric/Behavioral: Negative.  Negative for depression. The patient is not nervous/anxious and does not have insomnia.    Performance status (ECOG): 0  Vitals Last menstrual period 01/31/2014.   Physical Exam  Constitutional: She is oriented to person, place, and time. Vital signs are normal. She appears well-developed and well-nourished.  HENT:  Head: Normocephalic and atraumatic.  Eyes: Pupils are equal, round, and  reactive to light.  Neck: Normal range of motion.  Cardiovascular: Normal rate and regular rhythm.  No murmur heard. Pulmonary/Chest: Effort normal and breath sounds normal. She has no wheezes.  Abdominal: Soft. Bowel sounds are normal. She exhibits no distension and no mass. There is no abdominal tenderness.  Musculoskeletal: Normal range of motion.        General: No edema.  Neurological: She is alert and oriented to person, place, and time.  Skin: Skin is warm and dry.  Psychiatric: She has a normal mood and affect. Her behavior is normal.    No visits with results within 3 Day(s) from this visit.  Latest known visit with results is:  Office Visit on 08/25/2019  Component Date Value Ref Range Status   Hemoglobin A1C 08/25/2019 6.4* 4.0 - 5.6 % Final    Assessment:  Chelsea Hudson is a 53 y.o. 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  Biopsy on 02/28/2017 revealed invasive carcinoma with mucinous features.  Lymph node biopsy revealed metastatic disease.  Tumor was ER positive (  90%), PR positive (90%) and HER-2/neu negative.  She had clinical stage T3N3aM0 breast cancer.  PET scan on 03/09/2017 revealed hypermetabolic right axillary/subpectoral adenopathy.  There was low-grade activity in the right upper breast likely at the postoperative site.  Appearance was compatible with metastatic spread to right axillary/subpectoral lymph nodes.  There was some sigmoid colon diverticulosis, with faint inflammatory findings adjacent to the sigmoid colon proximally, and with accentuated activity in the involved segment of the sigmoid colon.   Partial mastectomy and sentinel lymph node biopsy on 07/30/2017 revealed a 1.8 cm invasive mammary carcinoma with mucinous features.  There was lymphovascular invasion.  Eight of 12 lymph nodes were positive for metastatic disease.   She required multiple  excisions to obtain clear margins resulting in a full mastectomy on 09/28/2017.  She underwent complete axillary node dissection on 03/04/2018.  Level 2 and 3 lymph node dissection revealed 3 of 3 lymph nodes positive for carcinoma.  The largest tumor deposit was at least 20 mm.  Extracapsular extension was present < 1 mm beyond the lymph node capsule.   Mammogram and ultrasound on 02/09/2017 revealed an irregular 2.1 x 1.0 x 0.9 cm hypoechoic mass with a feeding vessel 11:00, 8 cm from the nipple.  There was overlying skin thickness.  Ultrasound of the right axilla revealed at least 3 bulky lymph nodes deep to the pectoralis (level 2 lymphadenopathy).  Lymph node measured 1.6 x 1.4 x 1.9 cm and 2 x 1.7 x 1.8 cm.  She received 4 cycles of AC (03/22/2017 - 05/02/2017) and 7 of 12 cycles of neoadjuvant Taxol (05/16/2017 - 06/27/2017).  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  She received adjuvant radiation at [facility].  She began Femara in 05/2018.  Left mammogram on 10/09/2018 revealed no suspicious masses, asymmetries, or malignant calcifications.    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  Bone density on 07/17/2018 was normal with a T-score of -0.3 at the AP spine L1-L4.  Symptomatically, she is doing well.  She continues to have residual Taxol neuropathy in bilateral upper extremities.  Has large joint pain especially in right knee and bilateral shoulders.  Notes worsening right arm tingling/edema from shoulder to fingertips.  Plan: 1.   Labs today:  CBC with diff, CMP, CA27.29. 2.   Stage IIIC right breast cancer  Review entire medical history, diagnosis and management of breast cancer.  She underwent neoadjuvant chemotherapy (4 cycles of AC and 7 weeks of Taxol).  She has undergone completion mastectomy and radiation.  She began Femara on 05/2018.  Clinically, she is doing well with no evidence of recurrent  disease.  Mammogram completed on 10/13/2019 was BI-RADS 1: Negative.  Annual screening  recommended. 3.   Chemotherapy neuropathy  Continue Neurontin.  Referral to neurology.    Patient previously seen by Dr Melrose Nakayama. 4.   Right arm tingling/edema   History of right mastectomy with several lymph nodes removed.  Patient would benefit from evaluation with [facility] for lymphedema evaluation.  Referral sent.  5. Normocytic Anemia  Hemoglobin 11.8  MCV 80.5.   Denies any bleeding.   Will add on Ferritin, iron studies at next PORT flush (12/11/19).   Has history of hematuria, diverticulosis and fistula. S/p laparoscopic sigmoid colectomy on  04/11/2019 for recurrent UTI's and abdominal pain. No symptoms as of today.  6.  RTC in 2 months for port flush and labs (cbc, ferritin and iron studies). 7.   RTC  in 4 months for MD assessment and labs (CBC with diff, CMP, CA27.29).  I discussed the assessment and treatment plan with the patient.  The patient was provided an opportunity to ask questions and all were answered.  The patient agreed with the plan and demonstrated an understanding of the instructions.  The patient was advised to call back if the symptoms worsen or if the condition fails to improve as anticipated.  Greater than 50% was spent in counseling and coordination of care with this patient including but not limited to discussion of the relevant topics above (See A&P) including, but not limited to diagnosis and management of acute and chronic medical conditions.   Faythe Casa, NP 10/15/2019 12:53 PM

## 2019-10-15 NOTE — Telephone Encounter (Signed)
Faxed over PT orders to Lymphedema treatment program for right arm edema. Fax conformation confirmed.

## 2019-10-16 LAB — CANCER ANTIGEN 27.29: CA 27.29: 18.4 U/mL (ref 0.0–38.6)

## 2019-10-21 ENCOUNTER — Ambulatory Visit: Payer: Medicaid Other | Admitting: Occupational Therapy

## 2019-10-22 ENCOUNTER — Ambulatory Visit: Payer: Medicaid Other | Admitting: Surgery

## 2019-10-28 ENCOUNTER — Encounter: Payer: Self-pay | Admitting: Occupational Therapy

## 2019-10-28 ENCOUNTER — Ambulatory Visit: Payer: Medicaid Other | Attending: Oncology | Admitting: Occupational Therapy

## 2019-10-28 ENCOUNTER — Ambulatory Visit: Payer: Medicaid Other | Admitting: Occupational Therapy

## 2019-10-28 ENCOUNTER — Other Ambulatory Visit: Payer: Self-pay

## 2019-10-28 DIAGNOSIS — L905 Scar conditions and fibrosis of skin: Secondary | ICD-10-CM | POA: Insufficient documentation

## 2019-10-28 DIAGNOSIS — I972 Postmastectomy lymphedema syndrome: Secondary | ICD-10-CM | POA: Diagnosis not present

## 2019-10-28 DIAGNOSIS — M79601 Pain in right arm: Secondary | ICD-10-CM | POA: Diagnosis present

## 2019-10-28 DIAGNOSIS — M25611 Stiffness of right shoulder, not elsewhere classified: Secondary | ICD-10-CM | POA: Diagnosis present

## 2019-10-28 DIAGNOSIS — R209 Unspecified disturbances of skin sensation: Secondary | ICD-10-CM | POA: Diagnosis present

## 2019-10-28 DIAGNOSIS — R208 Other disturbances of skin sensation: Secondary | ICD-10-CM

## 2019-10-28 NOTE — Therapy (Signed)
[place] [address] [place], [address] Phone: [phone]   Fax:  [phone]  Occupational Therapy Evaluation  Patient Details  Name: Chelsea Hudson MRN: [id number] Date of Birth: June 19, 1966 Referring Provider (OT): Faythe Casa   Encounter Date: 10/28/2019  OT End of Session - 10/28/19 1549    Visit Number  1    Number of Visits  6    Date for OT Re-Evaluation  11/18/19    OT Start Time  0820    OT Stop Time  0918    OT Time Calculation (min)  58 min    Activity Tolerance  Patient tolerated treatment well    Behavior During Therapy  [facility] for tasks assessed/performed       Past Medical History:  Diagnosis Date  . Arthritis    SHOULDER  . Breast cancer ([place]) 02/2017   rt breast  . Cancer ([place]) 02/28/2017   INVASIVE MAMMARY CARCINOMA WITH MUCINOUS FEATURES.  . Colonic diverticular abscess 06/21/2017   Colonoscopy 06/25/2017: No evidence of malignancy.  . Diabetes mellitus without complication ([place])   . Hypertension   . Irregular heart beat    PT STATES IT "SKIPS A BEAT"   . Obesity   . Personal history of chemotherapy   . Personal history of radiation therapy     Past Surgical History:  Procedure Laterality Date  . ANKLE SURGERY    . BREAST BIOPSY Right 02/28/2017   INVASIVE MAMMARY CARCINOMA WITH MUCINOUS FEATURES.  Marland Kitchen BREAST CYST ASPIRATION Right    NEG  . COLONOSCOPY WITH PROPOFOL N/A 06/25/2017   Procedure: COLONOSCOPY WITH PROPOFOL;  Surgeon: Jonathon Bellows, MD;  Location: [facility]COPY;  Service: Gastroenterology;  Laterality: N/A;  . MASTECTOMY, PARTIAL Right 07/30/2017   Procedure: MASTECTOMY PARTIAL;  Surgeon: Robert Bellow, MD;  Location: [facility] ORS;  Service: General;  Laterality: Right;  . PORTACATH PLACEMENT Left 03/15/2017   Procedure: INSERTION PORT-A-CATH;  Surgeon: Robert Bellow, MD;  Location: [facility] ORS;  Service: General;  Laterality: Left;  . RE-EXCISION OF  BREAST LUMPECTOMY Right 08/17/2017    INVASIVE CARCINOMA EXTENDS TO NEW LATERAL MARGIN. /RE-EXCISION OF BREAST LUMPECTOMY;: Byrnett, Forest Gleason, MD;  [facility] ORS; General;  Laterality: Right;  . SENTINEL NODE BIOPSY Right 07/30/2017   Procedure: SENTINEL NODE BIOPSY;  Surgeon: Robert Bellow, MD;  Location: [facility] ORS;  Service: General;  Laterality: Right;  . SIMPLE MASTECTOMY WITH AXILLARY SENTINEL NODE BIOPSY Right 09/28/2017   Procedure: SIMPLE MASTECTOMY;  Surgeon: Robert Bellow, MD;  Location: [facility] ORS;  Service: General;  Laterality: Right;    There were no vitals filed for this visit.  Subjective Assessment - 10/28/19 1519    Subjective   I probably had my symptoms for more than year - the swelling stays the same, and the pins and needles goes at times worse , and is worse in the R arm and hand    Pertinent History  R breast CA in 22018/2019 with mastectomy -and about 15 axillary ln removed , had chemo that resulted in neuropathy but stable with gabapentin; Radation 2019 - and report having about year swelling in her R arm and under her arm - increase numbness/pins and needles the last 6 months to year , decrease shoulder AROM over head - cannot hold arm up - rest her hand on her head    Patient Stated Goals  I want the swelling and numbness better in my arm ,  not to get worse - and pain in arm better so I can reach over head    Currently in Pain?  No/denies        [facility] OT Assessment - 10/28/19 0001      Assessment   Medical Diagnosis  R UE and thoracic lymphedema,  scar tissue and decrease shoulder AROM     Referring Provider (OT)  Faythe Casa    Onset Date/Surgical Date  09/20/18    Hand Dominance  Right      Precautions   Precaution Comments  --   R UE lymphedema      Home  Environment   Lives With  Spouse      Prior Function   Vocation  --   not working this past year -after CA treatment   Leisure  Use to crochet, watch tv , do own house work       AROM   Right  Shoulder ABduction  100 Degrees       LYMPHEDEMA/ONCOLOGY QUESTIONNAIRE - 10/28/19 1536      Lymphedema Stage   Stage  STAGE 2 SPONTANEOUSLY IRREVERSIBLE      Right Upper Extremity Lymphedema   15 cm Proximal to Olecranon Process  44 cm    10 cm Proximal to Olecranon Process  40 cm    Olecranon Process  32.4 cm    15 cm Proximal to Ulnar Styloid Process  29 cm    10 cm Proximal to Ulnar Styloid Process  24 cm    Just Proximal to Ulnar Styloid Process  20.5 cm    Across Hand at [employer]  21.5 cm    At [place] of 2nd Digit  7 cm    At [facility] of Thumb  7 cm      Left Upper Extremity Lymphedema   15 cm Proximal to Olecranon Process  41 cm    10 cm Proximal to Olecranon Process  37 cm    Olecranon Process  31 cm    15 cm Proximal to Ulnar Styloid Process  27.2 cm    10 cm Proximal to Ulnar Styloid Process  22.8 cm    Just Proximal to Ulnar Styloid Process  19.3 cm    Across Hand at [employer]  20 cm    At [place] of 2nd Digit  7 cm    At [facility] of Thumb  7 cm        Scar massage on R chest and under arm - 3 x day for 3 min  Mom or husband to massage her upper traps -  In shower - Lateral cervical flexion stretch to L shoulder 10 reps hold 5 sec  And in supine - use broom stick or cane over head for shoulder flexion 15 reps  2 x day   [facility] - pick up unilateral post mastectomy Jovipak breast pad - to wear under bra night time and some during day Also get new bras with wide band under R axilla to provide compression over pocket of swelling under arm ( lymphedema)   Temporary compression on R arm - isotoner glove, Soft tubigrip on hand to elbow and wide Tubigrip F from mid forearm to upper arm -with velcro band to prevent rolling                OT Education - 10/28/19 1549    Education Details  findings of eval and HEP    Person(s) Educated  Patient  Methods  Explanation;Demonstration;Tactile cues;Verbal cues;Handout    Comprehension  Verbal cues  required;Returned demonstration;Verbalized understanding       OT Short Term Goals - 10/28/19 1557      OT SHORT TERM GOAL #1   Title  R UE circumference decrease in upper arm by 1-2 cm to get measured for day and night time compression garments    Baseline  R UE increase by 3 cm in upper arm, 1.4 cm in elbow , 1.8 cm in forearm , wrist increase to 1.2 cm , hand 1.5 cm    Time  2    Period  Weeks    Status  New    Target Date  11/11/19        OT Long Term Goals - 10/28/19 1600      OT LONG TERM GOAL #1   Title  Pt to  be independent in use of daytime and night time compression garments to decrease and maintain circumference of R UE    Baseline  no garments - lymphedema causing pain and numbness in R UE    Time  3    Period  Weeks    Status  New    Target Date  11/18/19      OT LONG TERM GOAL #2   Title  Pt show increase R Shoulder AROM  by 20 degrees at least to be able to donn compression garments and decrease upper traps pain    Baseline  pain increase to 9/10 in R UE , increase numbness, shoulder ABD 100 , upper traps tightness , limited by scar tissue    Time  3    Period  Weeks    Status  New    Target Date  11/18/19            Plan - 10/28/19 1550    Clinical Impression Statement  Pt refer to OT for  stage 2 R UE and thoracic lymphedema, numbness in R UE with scar  adhesions on R  chest wall , decrease R shoulder AROM and PROM , upper trap tightness with pain in R UE that can increase to 9/10 per pt - all limiting her ADL's and IADL's    OT Occupational Profile and History  Problem Focused Assessment - Including review of records relating to presenting problem    Occupational performance deficits (Please refer to evaluation for details):  ADL's;IADL's;Rest and Sleep;Play;Work;Leisure;Social Participation    Body Structure / Function / Physical Skills  ADL;Decreased knowledge of precautions;Flexibility;ROM;UE functional use;Scar  mobility;Sensation;Edema;Pain;Strength    Rehab Potential  Fair    Clinical Decision Making  Limited treatment options, no task modification necessary    Comorbidities Affecting Occupational Performance:  May have comorbidities impacting occupational performance   chemo induce neuropathy   Modification or Assistance to Complete Evaluation   No modification of tasks or assist necessary to complete eval    OT Frequency  2x / week    OT Duration  --   3 wks   OT Treatment/Interventions  Self-care/ADL training;Manual lymph drainage;Therapeutic exercise;Compression bandaging;Patient/family education;Scar mobilization;Manual Therapy;Passive range of motion    Plan  assess cirrcumference with tubigrip compression on R arm - use of jovipak breast pad the last week    OT Home Exercise Plan  see pt instruction    Consulted and Agree with Plan of Care  Patient       Patient will benefit from skilled therapeutic intervention in order to improve the following deficits and  impairments:   Body Structure / Function / Physical Skills: ADL, Decreased knowledge of precautions, Flexibility, ROM, UE functional use, Scar mobility, Sensation, Edema, Pain, Strength       Visit Diagnosis: Postmastectomy lymphedema syndrome - Plan: Ot plan of care cert/re-cert  Stiffness of right shoulder, not elsewhere classified - Plan: Ot plan of care cert/re-cert  Other disturbances of skin sensation - Plan: Ot plan of care cert/re-cert  Scar condition and fibrosis of skin - Plan: Ot plan of care cert/re-cert  Pain in right arm - Plan: Ot plan of care cert/re-cert    Problem List Patient Active Problem List   Diagnosis Date Noted  . 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 Chemotherapy-induced neuropathy ([place]) 01/29/2019  . Pyuria 11/07/2018  . Urinary urgency 11/07/2018  . Hyperlipidemia associated with type 2 diabetes mellitus ([place]) 10/02/2018  . Neuropathy due to chemotherapeutic drug ([place]) 10/02/2018  . Myalgia  due to statin 10/02/2018  . Controlled type 2 diabetes mellitus with hyperglycemia ([place]) 07/05/2018  . Diverticulosis of large intestine without diverticulitis 03/01/2018  . Personal history of chemotherapy 01/03/2018  . Malignant neoplasm of upper-outer quadrant of right breast in female, estrogen receptor positive ([place]) 03/05/2017  . Goals of care, counseling/discussion 03/05/2017  . Essential hypertension 01/05/2016    Rosalyn Gess OTR/L,CLT 10/28/2019, 4:08 PM  [place] [address] [place], [address] Phone: [phone]   Fax:  [phone]  Name: Chelsea Hudson MRN: [id number] Date of Birth: 1966-07-21

## 2019-10-28 NOTE — Patient Instructions (Signed)
Scar massage on R chest and under arm - 3 x day for 3 min  Mom or husband to massage her upper traps -  In shower - Lateral cervical flexion stretch to L shoulder 10 reps hold 5 sec  And in supine - use broom stick or cane over head for shoulder flexion 15 reps  2 x day   [facility] - pick up unilateral post mastectomy Jovipak breast pad - to wear under bra night time and some during day Also get new bras with wide band under R axilla to provide compression over pocket of swelling under arm ( lymphedema)   Temporary compression on R arm - isotoner glove, Soft tubigrip on hand to elbow and wide Tubigrip F from mid forearm to upper arm -with velcro band to prevent rolling

## 2019-10-29 ENCOUNTER — Ambulatory Visit: Payer: Medicaid Other | Admitting: Occupational Therapy

## 2019-10-29 ENCOUNTER — Ambulatory Visit: Payer: Medicaid Other | Admitting: Surgery

## 2019-11-03 ENCOUNTER — Encounter: Payer: Self-pay | Admitting: Surgery

## 2019-11-03 ENCOUNTER — Ambulatory Visit (INDEPENDENT_AMBULATORY_CARE_PROVIDER_SITE_OTHER): Payer: Medicaid Other | Admitting: Surgery

## 2019-11-03 ENCOUNTER — Other Ambulatory Visit: Payer: Self-pay | Admitting: Nurse Practitioner

## 2019-11-03 ENCOUNTER — Ambulatory Visit: Payer: Medicaid Other | Admitting: Occupational Therapy

## 2019-11-03 ENCOUNTER — Other Ambulatory Visit: Payer: Self-pay

## 2019-11-03 VITALS — BP 120/81 | HR 86 | Temp 97.7°F | Ht 66.0 in | Wt 241.0 lb

## 2019-11-03 DIAGNOSIS — I972 Postmastectomy lymphedema syndrome: Secondary | ICD-10-CM

## 2019-11-03 DIAGNOSIS — L905 Scar conditions and fibrosis of skin: Secondary | ICD-10-CM

## 2019-11-03 DIAGNOSIS — Z170 Estrogen receptor positive status [ER+]: Secondary | ICD-10-CM | POA: Diagnosis not present

## 2019-11-03 DIAGNOSIS — M79601 Pain in right arm: Secondary | ICD-10-CM

## 2019-11-03 DIAGNOSIS — C50411 Malignant neoplasm of upper-outer quadrant of right female breast: Secondary | ICD-10-CM | POA: Diagnosis not present

## 2019-11-03 DIAGNOSIS — E1165 Type 2 diabetes mellitus with hyperglycemia: Secondary | ICD-10-CM

## 2019-11-03 DIAGNOSIS — R208 Other disturbances of skin sensation: Secondary | ICD-10-CM

## 2019-11-03 DIAGNOSIS — M25611 Stiffness of right shoulder, not elsewhere classified: Secondary | ICD-10-CM

## 2019-11-03 MED ORDER — METFORMIN HCL 500 MG PO TABS: ORAL_TABLET | ORAL | 1 refills | Status: DC

## 2019-11-03 NOTE — Therapy (Signed)
[place] [address] [place], [address] Phone: [phone]   Fax:  [phone]  Occupational Therapy Screen  Patient Details  Name: Chelsea Hudson MRN: [id number] Date of Birth: October 24, 1966 Referring Provider (OT): Faythe Casa   Encounter Date: 11/03/2019  OT End of Session - 11/03/19 1601    Visit Number  1    Number of Visits  6    Date for OT Re-Evaluation  11/18/19    OT Start Time  1415    OT Stop Time  1435    OT Time Calculation (min)  20 min    Activity Tolerance  Patient tolerated treatment well    Behavior During Therapy  [facility] for tasks assessed/performed       Past Medical History:  Diagnosis Date  . Arthritis    SHOULDER  . Breast cancer ([place]) 02/2017   rt breast  . Cancer ([place]) 02/28/2017   INVASIVE MAMMARY CARCINOMA WITH MUCINOUS FEATURES.  . Colonic diverticular abscess 06/21/2017   Colonoscopy 06/25/2017: No evidence of malignancy.  . Diabetes mellitus without complication ([place])   . Hypertension   . Irregular heart beat    PT STATES IT "SKIPS A BEAT"   . Obesity   . Personal history of chemotherapy   . Personal history of radiation therapy     Past Surgical History:  Procedure Laterality Date  . ANKLE SURGERY    . BREAST BIOPSY Right 02/28/2017   INVASIVE MAMMARY CARCINOMA WITH MUCINOUS FEATURES.  Marland Kitchen BREAST CYST ASPIRATION Right    NEG  . COLONOSCOPY WITH PROPOFOL N/A 06/25/2017   Procedure: COLONOSCOPY WITH PROPOFOL;  Surgeon: Jonathon Bellows, MD;  Location: [facility] ENDOSCOPY;  Service: Gastroenterology;  Laterality: N/A;  . MASTECTOMY, PARTIAL Right 07/30/2017   Procedure: MASTECTOMY PARTIAL;  Surgeon: Robert Bellow, MD;  Location: [facility] ORS;  Service: General;  Laterality: Right;  . PORTACATH PLACEMENT Left 03/15/2017   Procedure: INSERTION PORT-A-CATH;  Surgeon: Robert Bellow, MD;  Location: [facility] ORS;  Service: General;  Laterality: Left;  . RE-EXCISION OF BREAST  LUMPECTOMY Right 08/17/2017    INVASIVE CARCINOMA EXTENDS TO NEW LATERAL MARGIN. /RE-EXCISION OF BREAST LUMPECTOMY;: Byrnett, Forest Gleason, MD;  [facility] ORS; General;  Laterality: Right;  . SENTINEL NODE BIOPSY Right 07/30/2017   Procedure: SENTINEL NODE BIOPSY;  Surgeon: Robert Bellow, MD;  Location: [facility] ORS;  Service: General;  Laterality: Right;  . SIMPLE MASTECTOMY WITH AXILLARY SENTINEL NODE BIOPSY Right 09/28/2017   Procedure: SIMPLE MASTECTOMY;  Surgeon: Robert Bellow, MD;  Location: [facility] ORS;  Service: General;  Laterality: Right;    There were no vitals filed for this visit.  Subjective Assessment - 11/03/19 1558    Subjective   I thought my appt was later - sorry- but I wore the little sleeve on my arm most all time time-and then the jovipak breastpad under my bra the same - forgot my neck stretches - but numbness and shoulder pain better- my range of motion is better    Pertinent History  R breast CA in 22018/2019 with mastectomy -and about 15 axillary ln removed , had chemo that resulted in neuropathy but stable with gabapentin; Radation 2019 - and report having about year swelling in her R arm and under her arm - increase numbness/pins and needles the last 6 months to year , decrease shoulder AROM over head - cannot hold arm up - rest her hand on her head  Patient Stated Goals  I want the swelling and numbness better in my arm , not to get worse - and pain in arm better so I can reach over head    Currently in Pain?  No/denies          LYMPHEDEMA/ONCOLOGY QUESTIONNAIRE - 11/03/19 1423      Right Upper Extremity Lymphedema   15 cm Proximal to Olecranon Process  43.3 cm    10 cm Proximal to Olecranon Process  39.2 cm    Olecranon Process  32 cm    15 cm Proximal to Ulnar Styloid Process  29 cm    10 cm Proximal to Ulnar Styloid Process  23.5 cm    Just Proximal to Ulnar Styloid Process  20.5 cm    Across Hand at [employer]  21.2 cm    At [place] of 2nd Digit  6.8 cm     At [facility] of Thumb  6.8 cm       Pt arrive last for appt - did not want to use her 3 visits from medicaid   Pt report improvement in numbness and pins and needles in R arm and less pain  SHow more AROM - shoulder flexion 120 and ABD 115 degrees   Review again with pt   Scar massage on R chest and under arm - 3 x day for 3 min  But to do it now with arm over head   Mom or husband to cont  massage her upper traps -  Pt forgot but remind again to do in shower - Lateral cervical flexion stretch to L shoulder 10 reps hold 5 sec  Cont with  And in supine - use broom stick or cane over head for shoulder flexion 15 reps  2 x day   She did pick up from [facility] - unilateral post mastectomy Jovipak breast pad - wore under bra night time and some during day - most all the time - area under arm - softer  Also get new bras with wide band under R axilla to provide compression over pocket of swelling under arm ( lymphedema)  - await arrival  Her L arm did decrease some - provided pt again with temporary compression on R arm - isotoner glove, D tubigrip on hand to elbow and wide Tubigrip F from mid forearm to upper arm -with velcro band to prevent rolling  And pt to get measured for her day time custom sleeve and glove  As well as [place] night sleeve and hand piece   Will check on pt in week               OT Education - 11/03/19 1601    Education Details  progress , HEP and POC    Person(s) Educated  Patient    Methods  Explanation;Demonstration;Tactile cues;Verbal cues;Handout    Comprehension  Verbal cues required;Returned demonstration;Verbalized understanding       OT Short Term Goals - 10/28/19 1557      OT SHORT TERM GOAL #1   Title  R UE circumference decrease in upper arm by 1-2 cm to get measured for day and night time compression garments    Baseline  R UE increase by 3 cm in upper arm, 1.4 cm in elbow , 1.8 cm in forearm , wrist increase to 1.2 cm , hand 1.5  cm    Time  2    Period  Weeks    Status  New  Target Date  11/11/19        OT Long Term Goals - 10/28/19 1600      OT LONG TERM GOAL #1   Title  Pt to  be independent in use of daytime and night time compression garments to decrease and maintain circumference of R UE    Baseline  no garments - lymphedema causing pain and numbness in R UE    Time  3    Period  Weeks    Status  New    Target Date  11/18/19      OT LONG TERM GOAL #2   Title  Pt show increase R Shoulder AROM  by 20 degrees at least to be able to donn compression garments and decrease upper traps pain    Baseline  pain increase to 9/10 in R UE , increase numbness, shoulder ABD 100 , upper traps tightness , limited by scar tissue    Time  3    Period  Weeks    Status  New    Target Date  11/18/19              Patient will benefit from skilled therapeutic intervention in order to improve the following deficits and impairments:           Visit Diagnosis: Postmastectomy lymphedema syndrome  Stiffness of right shoulder, not elsewhere classified  Other disturbances of skin sensation  Scar condition and fibrosis of skin  Pain in right arm    Problem List Patient Active Problem List   Diagnosis Date Noted  . 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 Chemotherapy-induced neuropathy ([place]) 01/29/2019  . Pyuria 11/07/2018  . Urinary urgency 11/07/2018  . Hyperlipidemia associated with type 2 diabetes mellitus ([place]) 10/02/2018  . Neuropathy due to chemotherapeutic drug ([place]) 10/02/2018  . Myalgia due to statin 10/02/2018  . Controlled type 2 diabetes mellitus with hyperglycemia ([place]) 07/05/2018  . Diverticulosis of large intestine without diverticulitis 03/01/2018  . Personal history of chemotherapy 01/03/2018  . Malignant neoplasm of upper-outer quadrant of right breast in female, estrogen receptor positive ([place]) 03/05/2017  . Goals of care, counseling/discussion 03/05/2017  . Essential hypertension  01/05/2016    Rosalyn Gess OTR/l,CLT 11/03/2019, 4:02 PM  [facility] [facility] [address] [place], [address] Phone: [phone]   Fax:  [phone]  Name: Chelsea Hudson MRN: [id number] Date of Birth: 29-Dec-1965

## 2019-11-03 NOTE — Telephone Encounter (Signed)
Pt  Called requesting refill on metformin

## 2019-11-03 NOTE — Patient Instructions (Signed)
See note - add scapula squeezes to HEP

## 2019-11-03 NOTE — Patient Instructions (Signed)
Breast Self-Awareness Breast self-awareness means being familiar with how your breasts look and feel. It involves checking your breasts regularly and reporting any changes to your health care provider. Practicing breast self-awareness is important. Sometimes changes may not be harmful (are benign), but sometimes a change in your breasts can be a sign of a serious medical problem. It is important to learn how to do this procedure correctly so that you can catch problems early, when treatment is more likely to be successful. All women should practice breast self-awareness, including women who have had breast implants. What you need:  A mirror.  A well-lit room. How to do a breast self-exam A breast self-exam is one way to learn what is normal for your breasts and whether your breasts are changing. To do a breast self-exam: Look for changes  1. Remove all the clothing above your waist. 2. Stand in front of a mirror in a room with good lighting. 3. Put your hands on your hips. 4. Push your hands firmly downward. 5. Compare your breasts in the mirror. Look for differences between them (asymmetry), such as: ? Differences in shape. ? Differences in size. ? Puckers, dips, and bumps in one breast and not the other. 6. Look at each breast for changes in the skin, such as: ? Redness. ? Scaly areas. 7. Look for changes in your nipples, such as: ? Discharge. ? Bleeding. ? Dimpling. ? Redness. ? A change in position. Feel for changes Carefully feel your breasts for lumps and changes. It is best to do this while lying on your back on the floor, and again while sitting or standing in the tub or shower with soapy water on your skin. Feel each breast in the following way: 1. Place the arm on the side of the breast you are examining above your head. 2. Feel your breast with the other hand. 3. Start in the nipple area and make -inch (2 cm) overlapping circles to feel your breast. Use the pads of your  three middle fingers to do this. Apply light pressure, then medium pressure, then firm pressure. The light pressure will allow you to feel the tissue closest to the skin. The medium pressure will allow you to feel the tissue that is a little deeper. The firm pressure will allow you to feel the tissue close to the ribs. 4. Continue the overlapping circles, moving downward over the breast until you feel your ribs below your breast. 5. Move one finger-width toward the center of the body. Continue to use the -inch (2 cm) overlapping circles to feel your breast as you move slowly up toward your collarbone. 6. Continue the up-and-down exam using all three pressures until you reach your armpit.  Write down what you find Writing down what you find can help you remember what to discuss with your health care provider. Write down:  What is normal for each breast.  Any changes that you find in each breast, including: ? The kind of changes you find. ? Any pain or tenderness. ? Size and location of any lumps.  Where you are in your menstrual cycle, if you are still menstruating. General tips and recommendations  Examine your breasts every month.  If you are breastfeeding, the best time to examine your breasts is after a feeding or after using a breast pump.  If you menstruate, the best time to examine your breasts is 5-7 days after your period. Breasts are generally lumpier during menstrual periods, and it may  be more difficult to notice changes.  With time and practice, you will become more familiar with the variations in your breasts and more comfortable with the exam. Contact a health care provider if you:  See a change in the shape or size of your breasts or nipples.  See a change in the skin of your breast or nipples, such as a reddened or scaly area.  Have unusual discharge from your nipples.  Find a lump or thick area that was not there before.  Have pain in your breasts.  Have any  concerns related to your breast health. Summary  Breast self-awareness includes looking for physical changes in your breasts, as well as feeling for any changes within your breasts.  Breast self-awareness should be performed in front of a mirror in a well-lit room.  You should examine your breasts every month. If you menstruate, the best time to examine your breasts is 5-7 days after your menstrual period.  Let your health care provider know of any changes you notice in your breasts, including changes in size, changes on the skin, pain or tenderness, or unusual fluid from your nipples. This information is not intended to replace advice given to you by your health care provider. Make sure you discuss any questions you have with your health care provider. Document Released: 11/06/2005 Document Revised: 06/25/2018 Document Reviewed: 06/25/2018 Elsevier Patient Education  2020 [employer].

## 2019-11-05 NOTE — Progress Notes (Signed)
Outpatient Surgical Follow Up  11/05/2019  Chelsea Hudson is an 54 y.o. female.     HPI: Ms. Callard is a 53 year old female with a history of right breast cancer diagnosed in 2018 requiring lumpectomy and had positive margins requiring reexcision and then ultimately right mastectomy W AND.  She did received chemotherapy as well as radiation therapy.  More recently developed diverticulitis with fistula and had a sigmoid colectomy at [facility] by Dr. Madaline Brilliant.  She has recovered well from this. She denies any chest wall complaints.  No new masses.  No fevers no chills no weight loss.  She is eating well. She does have some chronic lymphedema on the right upper extremity and she does have a wrap.  Recent mammogram personally reviewed there is no evidence of a new breast masses or suspicious lesions.  Past Medical History:  Diagnosis Date  . Arthritis    SHOULDER  . Breast cancer ([place]) 02/2017   rt breast  . Cancer ([place]) 02/28/2017   INVASIVE MAMMARY CARCINOMA WITH MUCINOUS FEATURES.  . Colonic diverticular abscess 06/21/2017   Colonoscopy 06/25/2017: No evidence of malignancy.  . Diabetes mellitus without complication ([place])   . Hypertension   . Irregular heart beat    PT STATES IT "SKIPS A BEAT"   . Obesity   . Personal history of chemotherapy   . Personal history of radiation therapy     Past Surgical History:  Procedure Laterality Date  . ANKLE SURGERY    . BREAST BIOPSY Right 02/28/2017   INVASIVE MAMMARY CARCINOMA WITH MUCINOUS FEATURES.  Marland Kitchen BREAST CYST ASPIRATION Right    NEG  . COLONOSCOPY WITH PROPOFOL N/A 06/25/2017   Procedure: COLONOSCOPY WITH PROPOFOL;  Surgeon: Jonathon Bellows, MD;  [facility] END[facility];  Service: Gastroenterology;  Laterality: N/A;  . MASTECTOMY, PARTIAL Right 07/30/2017   Procedure: MASTECTOMY PARTIAL;  Surgeon: Robert Bellow, MD;  Location: [facility] ORS;  Service: General;  Laterality: Right;  . PORTACATH PLACEMENT Left 03/15/2017   Procedure:  INSERTION PORT-A-CATH;  Surgeon: Robert Bellow, MD;  Location: [facility] ORS;  Service: General;  Laterality: Left;  . RE-EXCISION OF BREAST LUMPECTOMY Right 08/17/2017    INVASIVE CARCINOMA EXTENDS TO NEW LATERAL MARGIN. /RE-EXCISION OF BREAST LUMPECTOMY;: Byrnett, Forest Gleason, MD;  [facility] ORS; General;  Laterality: Right;  . SENTINEL NODE BIOPSY Right 07/30/2017   Procedure: SENTINEL NODE BIOPSY;  Surgeon: Robert Bellow, MD;  Location: [facility] ORS;  Service: General;  Laterality: Right;  . SIMPLE MASTECTOMY WITH AXILLARY SENTINEL NODE BIOPSY Right 09/28/2017   Procedure: SIMPLE MASTECTOMY;  Surgeon: Robert Bellow, MD;  Location: [facility] ORS;  Service: General;  Laterality: Right;    Family History  Problem Relation Age of Onset  . Diabetes Father   . Stroke Father   . Breast cancer Mother 47  . Brain cancer Maternal Aunt 60  . Colon cancer Neg Hx     Social History:  reports that she quit smoking about 2 years ago. Her smoking use included cigarettes. She has a 6.25 pack-year smoking history. She has quit using smokeless tobacco. She reports current alcohol use. She reports that she does not use drugs.  Allergies:  Allergies  Allergen Reactions  . Other Hives and Itching    [name]     Medications reviewed.    ROS Full ROS performed and is otherwise negative other than what is  stated in HPI   BP 120/81   Pulse 86   Temp 97.7 F (36.5 C) (Temporal)   Ht 5\' 6"  (1.676 m)   Wt 241 lb (109.3 kg)   LMP 01/31/2014 (Approximate) Comment: LMP was 3 years ago.  SpO2 99%   BMI 38.90 kg/m   Physical Exam Vitals and nursing note reviewed. Exam conducted with a chaperone present.  Pulmonary:     Effort: Pulmonary effort is normal. No respiratory distress.     Breath sounds: No wheezing.     Comments: BREAST: Right chest wall w radiation changes, no evidence of local recurrence no  LAD. Left breast w/o masses or lesion no LAD. She does have some lymphedema on the right amr, has a wrap/ Abdominal:     General: Abdomen is flat. There is no distension.     Palpations: There is no mass.     Tenderness: There is no abdominal tenderness. There is no guarding.     Hernia: No hernia is present.  Skin:    Capillary Refill: Capillary refill takes less than 2 seconds.  Neurological:     General: No focal deficit present.     Mental Status: She is oriented to person, place, and time.  Psychiatric:        Mood and Affect: Mood normal.        Behavior: Behavior normal.        Thought Content: Thought content normal.        Judgment: Judgment normal.       Assessment/Plan: 54 year old female with a history of right breast cancer with metastatic disease to the cervical supraclavicular nodes and axillary status post radiation.  Currently no evidence of local regional recurrence.  We will continue annual follow-up next year and mammogram.  Greater than 50% of the 25 minutes  visit was spent in counseling/coordination of care   Caroleen Hamman, MD [place] Surgeon

## 2019-11-10 ENCOUNTER — Other Ambulatory Visit: Payer: Self-pay

## 2019-11-10 ENCOUNTER — Ambulatory Visit: Payer: Medicaid Other | Admitting: Occupational Therapy

## 2019-11-10 DIAGNOSIS — M79601 Pain in right arm: Secondary | ICD-10-CM

## 2019-11-10 DIAGNOSIS — R208 Other disturbances of skin sensation: Secondary | ICD-10-CM

## 2019-11-10 DIAGNOSIS — I972 Postmastectomy lymphedema syndrome: Secondary | ICD-10-CM | POA: Diagnosis not present

## 2019-11-10 DIAGNOSIS — M25611 Stiffness of right shoulder, not elsewhere classified: Secondary | ICD-10-CM

## 2019-11-10 DIAGNOSIS — L905 Scar conditions and fibrosis of skin: Secondary | ICD-10-CM

## 2019-11-10 NOTE — Patient Instructions (Signed)
Same HEP -but add AAROM  R shoulder flexion , ABD on wall - 10 reps  And pect stretches in corner - 6 hold 5 sec - gentle stretch on chest

## 2019-11-10 NOTE — Therapy (Signed)
[facility] [address], [place], [address] Phone: [phone]   Fax:  [phone]  Occupational Therapy Treatment  Patient Details  Name: Chelsea Hudson MRN: [id number] Date of Birth: September 09, 1966 Referring Provider (OT): Faythe Casa   Encounter Date: 11/10/2019  OT End of Session - 11/10/19 1623    Visit Number  2    Number of Visits  6    Date for OT Re-Evaluation  11/18/19    OT Start Time  1435    OT Stop Time  1519    OT Time Calculation (min)  44 min    Activity Tolerance  Patient tolerated treatment well    Behavior During Therapy  [facility] for tasks assessed/performed       Past Medical History:  Diagnosis Date  . Arthritis    SHOULDER  . Breast cancer ([place]) 02/2017   rt breast  . Cancer ([place]) 02/28/2017   INVASIVE MAMMARY CARCINOMA WITH MUCINOUS FEATURES.  . Colonic diverticular abscess 06/21/2017   Colonoscopy 06/25/2017: No evidence of malignancy.  . Diabetes mellitus without complication ([place])   . Hypertension   . Irregular heart beat    PT STATES IT "SKIPS A BEAT"   . Obesity   . Personal history of chemotherapy   . Personal history of radiation therapy     Past Surgical History:  Procedure Laterality Date  . ANKLE SURGERY    . BREAST BIOPSY Right 02/28/2017   INVASIVE MAMMARY CARCINOMA WITH MUCINOUS FEATURES.  Marland Kitchen BREAST CYST ASPIRATION Right    NEG  . COLONOSCOPY WITH PROPOFOL N/A 06/25/2017   Procedure: COLONOSCOPY WITH PROPOFOL;  Surgeon: Jonathon Bellows, MD;  Location: [facility] ENDOSCOPY;  Service: Gastroenterology;  Laterality: N/A;  . MASTECTOMY, PARTIAL Right 07/30/2017   Procedure: MASTECTOMY PARTIAL;  Surgeon: Robert Bellow, MD;  Location: [facility] ORS;  Service: General;  Laterality: Right;  . PORTACATH PLACEMENT Left 03/15/2017   Procedure: INSERTION PORT-A-CATH;  Surgeon: Robert Bellow, MD;  Location: [facility] ORS;  Service: General;  Laterality: Left;  . RE-EXCISION OF  BREAST LUMPECTOMY Right 08/17/2017    INVASIVE CARCINOMA EXTENDS TO NEW LATERAL MARGIN. /RE-EXCISION OF BREAST LUMPECTOMY;: Byrnett, Forest Gleason, MD;  [facility] ORS; General;  Laterality: Right;  . SENTINEL NODE BIOPSY Right 07/30/2017   Procedure: SENTINEL NODE BIOPSY;  Surgeon: Robert Bellow, MD;  Location: [facility] ORS;  Service: General;  Laterality: Right;  . SIMPLE MASTECTOMY WITH AXILLARY SENTINEL NODE BIOPSY Right 09/28/2017   Procedure: SIMPLE MASTECTOMY;  Surgeon: Robert Bellow, MD;  Location: [facility] ORS;  Service: General;  Laterality: Right;    There were no vitals filed for this visit.  Subjective Assessment - 11/10/19 1620    Subjective   Doing much better - I felt for the first time since week ago -some numbness and pain in my R arm yesterday - but I was cleaning because everybody is coming to my house for Christmas    Pertinent History  R breast CA in 22018/2019 with mastectomy -and about 15 axillary ln removed , had chemo that resulted in neuropathy but stable with gabapentin; Radation 2019 - and report having about year swelling in her R arm and under her arm - increase numbness/pins and needles the last 6 months to year , decrease shoulder AROM over head - cannot hold arm up - rest her hand on her head    Patient Stated Goals  I want the swelling and numbness better in  my arm , not to get worse - and pain in arm better so I can reach over head    Currently in Pain?  No/denies         [facility] OT Assessment - 11/10/19 0001      AROM   Right Shoulder Flexion  130 Degrees    Right Shoulder ABduction  130 Degrees      LYMPHEDEMA/ONCOLOGY QUESTIONNAIRE - 11/10/19 1445      Right Upper Extremity Lymphedema   15 cm Proximal to Olecranon Process  42.1 cm    10 cm Proximal to Olecranon Process  39.5 cm    Olecranon Process  31.5 cm    15 cm Proximal to Ulnar Styloid Process  28 cm    10 cm Proximal to Ulnar Styloid Process  23 cm    Just Proximal to Ulnar Styloid Process  20 cm     Across Hand at [employer]  20.5 cm    At [place] of 2nd Digit  6.5 cm    At [facility] of Thumb  6.5 cm        Pt report improvement in numbness and pins and needles in R arm and pain - only had it yesterday after she did a lot of cleaning  That was the first time she had symptoms since week ago  AROM in R  shoulder flexion 120 and ABD improve to 130 degrees - still tightness in chest and under arm at scar tissue   Review again with pt   Scar massage on R chest and under arm - 3 x day for 3 min  Cont to do it with arm over head   Mom or husband to cont  massage her upper traps - did not had help this far OT done some graston nr 4 on upper traps - sweeping - with AAROM for cervical lat flexion  Cont with Lateral cervical flexion stretch to L shoulder 10 reps hold 5 sec  Cont with  And in supine - use broom stick or cane over head for shoulder flexion 15 reps  2 x day  But can afterwards do AAROM on wall for shoulder flexion and ABD Add pect stretch - gentle in corner 6 reps hold 5-8 sec   Cont with  unilateral post mastectomy Jovipak breast pad - wore under bra night time and some during day - most all the time - area under arm is smaller and  softer  Also get new bras with wide band under R axilla to provide compression over pocket of swelling under arm ( lymphedema)  - await arrival  Her L arm circumference decrease again this week - she is awaiting her custom sleeves - she to cont with her  temporary compression on R arm - isotoner glove, D tubigrip on hand to elbow and wide Tubigrip F from mid forearm to upper arm -with velcro band to prevent rolling To contact me if custom sleeves comes in                OT Education - 11/10/19 1623    Education Details  progress , HEP and changes and POC    Person(s) Educated  Patient    Methods  Explanation;Demonstration;Tactile cues;Verbal cues;Handout    Comprehension  Verbal cues required;Returned demonstration;Verbalized  understanding       OT Short Term Goals - 11/10/19 1627      OT SHORT TERM GOAL #1   Title  R UE circumference  decrease in upper arm by 1-2 cm to get measured for day and night time compression garments    Baseline  see flowsheet - R UE decrease at all levels - await custom sleeves to come in    Status  Achieved        OT Long Term Goals - 11/10/19 1627      OT LONG TERM GOAL #1   Title  Pt to  be independent in use of daytime and night time compression garments to decrease and maintain circumference of R UE    Baseline  no garments - has temporary sleeve - await custom garments    Time  2    Period  Weeks    Status  On-going    Target Date  11/18/19      OT LONG TERM GOAL #2   Title  Pt show increase R Shoulder AROM  by 20 degrees at least to be able to donn compression garments and decrease upper traps pain    Baseline  EVALpain increase to 9/10 in R UE , increase numbness, shoulder ABD 100 , upper traps tightness , limited by scar tissue ; NOW pain and numbness only had one time this week - after cleaning the house, AROM increase to 130 for shoulder flexion and ABD , upper traps pain decrease but still tight    Time  2    Period  Weeks    Status  On-going    Target Date  11/18/19            Plan - 11/10/19 1624    Clinical Impression Statement  Pt with stage 2 R UE and thoracic lypmhedema , scar adhesions, decrease R shoulder AROM - but showed great progress in 2 sesssions with AROM , scar tissue , and circumference decrease with jovipak and temporary compression -await her custom sleeves for day and night time - pt to cont with HEP - upgrade her shoulder to AAROM on wall - and pect stretch - plan when pt has her compression garments will start strengthening    OT Occupational Profile and History  Problem Focused Assessment - Including review of records relating to presenting problem    Occupational performance deficits (Please refer to evaluation for details):   ADL's;IADL's;Rest and Sleep;Play;Work;Leisure;Social Participation    Body Structure / Function / Physical Skills  ADL;Decreased knowledge of precautions;Flexibility;ROM;UE functional use;Scar mobility;Sensation;Edema;Pain;Strength    Rehab Potential  Fair    Clinical Decision Making  Limited treatment options, no task modification necessary    Comorbidities Affecting Occupational Performance:  May have comorbidities impacting occupational performance    Modification or Assistance to Complete Evaluation   No modification of tasks or assist necessary to complete eval    OT Frequency  1x / week    OT Duration  2 weeks    OT Treatment/Interventions  Self-care/ADL training;Manual lymph drainage;Therapeutic exercise;Compression bandaging;Patient/family education;Scar mobilization;Manual Therapy;Passive range of motion    Plan  assess cirrcumference with tubigrip compression on R arm - use of jovipak breast pad the last week, and increase in ROM - assess fit of custom sleeves    OT Home Exercise Plan  see pt instruction    Consulted and Agree with Plan of Care  Patient       Patient will benefit from skilled therapeutic intervention in order to improve the following deficits and impairments:   Body Structure / Function / Physical Skills: ADL, Decreased knowledge of precautions, Flexibility, ROM, UE functional use, Scar  mobility, Sensation, Edema, Pain, Strength       Visit Diagnosis: Postmastectomy lymphedema syndrome  Stiffness of right shoulder, not elsewhere classified  Other disturbances of skin sensation  Scar condition and fibrosis of skin  Pain in right arm    Problem List Patient Active Problem List   Diagnosis Date Noted  . 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 Pyuria 11/07/2018  . Urinary urgency 11/07/2018  . Hyperlipidemia associated with type 2 diabetes mellitus ([place]) 10/02/2018  . Neuropathy due to chemotherapeutic drug  ([place]) 10/02/2018  . Myalgia due to statin 10/02/2018  . Controlled type 2 diabetes mellitus with hyperglycemia ([place]) 07/05/2018  . Diverticulosis of large intestine without diverticulitis 03/01/2018  . Personal history of chemotherapy 01/03/2018  . Malignant neoplasm of upper-outer quadrant of right breast in female, estrogen receptor positive ([place]) 03/05/2017  . Goals of care, counseling/discussion 03/05/2017  . Essential hypertension 01/05/2016    Rosalyn Gess OTR/L,CLT 11/10/2019, 4:30 PM  [facility] [address] [place], [address] Phone: [phone]   Fax:  [phone]  Name: Chelsea Hudson MRN: [id number] Date of Birth: 03/20/1966

## 2019-11-25 ENCOUNTER — Ambulatory Visit: Payer: Medicaid Other | Attending: Oncology | Admitting: Occupational Therapy

## 2019-11-28 ENCOUNTER — Ambulatory Visit: Payer: Medicaid Other | Admitting: Occupational Therapy

## 2019-12-03 ENCOUNTER — Other Ambulatory Visit: Payer: Self-pay

## 2019-12-03 ENCOUNTER — Encounter: Payer: Self-pay | Admitting: Family Medicine

## 2019-12-03 ENCOUNTER — Ambulatory Visit (INDEPENDENT_AMBULATORY_CARE_PROVIDER_SITE_OTHER): Payer: Medicaid Other | Admitting: Family Medicine

## 2019-12-03 DIAGNOSIS — K625 Hemorrhage of anus and rectum: Secondary | ICD-10-CM

## 2019-12-03 NOTE — Progress Notes (Signed)
Virtual Visit via Telephone The purpose of this virtual visit is to provide medical care while limiting exposure to the novel coronavirus (COVID19) for both patient and office staff.  Consent was obtained for phone visit:  Yes.   Answered questions that patient had about telehealth interaction:  Yes.   I discussed the limitations, risks, security and privacy concerns of performing an evaluation and management service by telephone. I also discussed with the patient that there may be a patient responsible charge related to this service. The patient expressed understanding and agreed to proceed.  Patient Location: Home Provider Location: [facility])  ---------------------------------------------------------------------- Chief Complaint  Patient presents with  . Rectal Bleeding    nauseated w/ bright red blood in the stool on yesterday. no pain or discomfort associated with the bm. She state she normally have 2-3 bm daily, but has not had one today.    Previous PCP Cassell Smiles, AGPCNP-BC  S: Reviewed CMA documentation. I have called patient and gathered additional HPI as follows:  Rectal Bleeding Reports that symptoms started yesterday x 1 episode acute bright red blood per rectum large amount, then it stopped, not associated with pain or other symptoms. She normally has 2-3 soft BM daily, not having constipation or straining or other issues. Not tried any medicine - Significant past History of colon / bladder connection (colo-vesicular fistula) she was seen by  Dr Donia Ast ([place]) for this back in 03/2019, had laparoscopic rectosigmoid resection with colorectal anastomosis, she did not have any other problems since then, and has done well - Now she is concerned about this bleeding episode. - No prior history of hemorrhoid in past or other related problem.   Denies any high risk travel to areas of current concern for COVID19. Denies any known or  suspected exposure to person with or possibly with COVID19.  Denies any fevers, chills, sweats, body ache, cough, shortness of breath, sinus pain or pressure, headache, abdominal pain, diarrhea  Past Medical History:  Diagnosis Date  . Arthritis    SHOULDER  . Breast cancer ([place]) 02/2017   rt breast  . Cancer ([place]) 02/28/2017   INVASIVE MAMMARY CARCINOMA WITH MUCINOUS FEATURES.  . Colonic diverticular abscess 06/21/2017   Colonoscopy 06/25/2017: No evidence of malignancy.  . Diabetes mellitus without complication ([place])   . Hypertension   . Irregular heart beat    PT STATES IT "SKIPS A BEAT"   . Obesity   . Personal history of chemotherapy   . Personal history of radiation therapy    Social History   Tobacco Use  . Smoking status: Former Smoker    Packs/day: 0.25    Years: 25.00    Pack years: 6.25    Types: Cigarettes    Quit date: 10/20/2017    Years since quitting: 2.1  . Smokeless tobacco: Former Network engineer Use Topics  . Alcohol use: Yes    Comment: occas  . Drug use: No    Current Outpatient Medications:  .  Accu-Chek Softclix Lancets lancets, USE TO CHECK GLUCOSE UP TO ONCE DAILY AS DIRECTED, Disp: , Rfl:  .  blood glucose meter kit and supplies, Dispense based on patient and insurance preference. Use up to one time daily as directed. (FOR ICD-10 E10.9, E11.9)., Disp: 1 each, Rfl: 0 .  Blood Glucose Monitoring Suppl (ACCU-CHEK AVIVA PLUS) w/Device KIT, USE UP TO ONE TIME DAILY AS DIRECTED, Disp: , Rfl:  .  gabapentin (NEURONTIN) 400 MG capsule,  Take 1 capsule (400 mg total) by mouth at bedtime., Disp: 90 capsule, Rfl: 1 .  glucose blood (ACCU-CHEK AVIVA PLUS) test strip, USE UP TO ONE TIME DAILY AS DIRECTED, Disp: , Rfl:  .  letrozole (FEMARA) 2.5 MG tablet, Take 1 tablet by mouth once daily, Disp: 90 tablet, Rfl: 3 .  metFORMIN (GLUCOPHAGE) 500 MG tablet, TAKE 2 TABLETS BY MOUTH ONCE DAILY WITH BREAKFAST AND 1 WITH SUPPER, Disp: 270 tablet, Rfl: 1 .   triamterene-hydrochlorothiazide (MAXZIDE-25) 37.5-25 MG tablet, Take 1 tablet by mouth daily., Disp: 90 tablet, Rfl: 1  Depression screen [facility] 2/9 12/03/2019 09/26/2018 05/24/2018  Decreased Interest 0 0 0  Down, Depressed, Hopeless 0 0 0  PHQ - 2 Score 0 0 0  Altered sleeping - - -  Tired, decreased energy - - -  Change in appetite - - -  Feeling bad or failure about yourself  - - -  Trouble concentrating - - -  Moving slowly or fidgety/restless - - -  Suicidal thoughts - - -  PHQ-9 Score - - -    No flowsheet data found.  -------------------------------------------------------------------------- O: No physical exam performed due to remote telephone encounter.  Lab results reviewed.  Recent Results (from the past 2160 hour(s))  Comprehensive metabolic panel     Status: Abnormal   Collection Time: 10/15/19  8:42 AM  Result Value Ref Range   Sodium 133 (L) 135 - 145 mmol/L   Potassium 3.4 (L) 3.5 - 5.1 mmol/L   Chloride 100 98 - 111 mmol/L   CO2 24 22 - 32 mmol/L   Glucose, Bld 164 (H) 70 - 99 mg/dL   BUN 14 6 - 20 mg/dL   Creatinine, Ser 0.84 0.44 - 1.00 mg/dL   Calcium 9.0 8.9 - 10.3 mg/dL   Total Protein 7.8 6.5 - 8.1 g/dL   Albumin 3.5 3.5 - 5.0 g/dL   AST 18 15 - 41 U/L   ALT 18 0 - 44 U/L   Alkaline Phosphatase 92 38 - 126 U/L   Total Bilirubin 0.3 0.3 - 1.2 mg/dL   GFR calc non Af Amer >60 >60 mL/min   GFR calc Af Amer >60 >60 mL/min   Anion gap 9 5 - 15    Comment: Performed at [facility], [address] [place], [place] [address]  CBC with Differential/Platelet     Status: Abnormal   Collection Time: 10/15/19  8:42 AM  Result Value Ref Range   WBC 8.6 4.0 - 10.5 K/uL   RBC 4.56 3.87 - 5.11 MIL/uL   Hemoglobin 11.8 (L) 12.0 - 15.0 g/dL   HCT 36.7 36.0 - 46.0 %   MCV 80.5 80.0 - 100.0 fL   MCH 25.9 (L) 26.0 - 34.0 pg   MCHC 32.2 30.0 - 36.0 g/dL   RDW 17.9 (H) 11.5 - 15.5 %   Platelets 359 150 - 400 K/uL   nRBC 0.0 0.0 - 0.2 %   Neutrophils  Relative % 81 %   Neutro Abs 6.8 1.7 - 7.7 K/uL   Lymphocytes Relative 14 %   Lymphs Abs 1.2 0.7 - 4.0 K/uL   Monocytes Relative 4 %   Monocytes Absolute 0.4 0.1 - 1.0 K/uL   Eosinophils Relative 1 %   Eosinophils Absolute 0.1 0.0 - 0.5 K/uL   Basophils Relative 0 %   Basophils Absolute 0.0 0.0 - 0.1 K/uL   Immature Granulocytes 0 %   Abs Immature Granulocytes 0.03 0.00 -  0.07 K/uL    Comment: Performed at [facility], [address], [place], [place] [address]  Cancer antigen 27.29     Status: None   Collection Time: 10/15/19  8:42 AM  Result Value Ref Range   CA 27.29 18.4 0.0 - 38.6 U/mL    Comment: (NOTE) Siemens Centaur Immunochemiluminometric Methodology (ICMA) Values obtained with different assay methods or kits cannot be used interchangeably. Results cannot be interpreted as absolute evidence of the presence or absence of malignant disease. Performed At: [facility] [place], [place] [id number] Rush Farmer MD RP:[id number]     -------------------------------------------------------------------------- A&P:  Problem List Items Addressed This Visit    None    Visit Diagnoses    Rectal bleeding    -  Primary     Clinically suggestive of x 1 acute episode rectal bleeding without obvious provoking factor Unsure if known hemorrhoids, none documented on chart prior or on colonoscopy 2018 She has had complex history in past 7-8 months with colo-vesicular fistula surgery repair / re-anastomosis from [facility] Surgery in [place]. Reviewed in careeverywhere on chart last op note and office visit and management at that time back in 03/2019.  Advised her that she can observe current symptoms, since now she is asymptomatic. Without abdominal pain, recurrent bleeding or other issue.  Possible to have been internal hemorrhoid or other cause of painless BRBPR  Emphasized to her that next step if she is concerned about the bleeding episode, OR if  any further episodes or new symptoms occur. She should next contact her General Surgeon who performed prior surgery in [place] and follow-up with them for a face to face exam to evaluate this rectal area.  She can seek care sooner if needed or sudden change.  No orders of the defined types were placed in this encounter.   Follow-up: - Return as needed  Patient verbalizes understanding with the above medical recommendations including the limitation of remote medical advice.  Specific follow-up and call-back criteria were given for patient to follow-up or seek medical care more urgently if needed.   - Time spent in direct consultation with patient on phone: 7 minutes  Nobie Putnam, [facility] 12/03/2019, 3:20 PM

## 2019-12-03 NOTE — Patient Instructions (Addendum)
Bleeding may have been due to an internal hemorrhoid It is hard to say at this time I recommend that you observe and monitor for any further bleeding episodes or any new symptoms  In meantime, can use improved fiber, hydration for possible hemorrhoids  IF BLEEDING RETURNS OR NEW SYMPTOMS (abdominal pain, nausea vomiting, rectal pain fever chills or UNABLE to have bowel movement) - Then need to seek care sooner. You can contact general surgeon from [facility] to follow-up in their office. Or if need can go to [facility] ED or Urgent Care sooner if severe symptoms.  Please schedule a Follow-up Appointment to: Return if symptoms worsen or fail to improve, for rectal bleeding.  If you have any other questions or concerns, please feel free to call the office or send a message through [place]. You may also schedule an earlier appointment if necessary.  Additionally, you may be receiving a survey about your experience at our office within a few days to 1 week by e-mail or mail. We value your feedback.  Nobie Putnam, DO [place]

## 2019-12-08 ENCOUNTER — Ambulatory Visit: Payer: Medicaid Other | Admitting: Occupational Therapy

## 2019-12-10 ENCOUNTER — Other Ambulatory Visit: Payer: Self-pay

## 2019-12-11 ENCOUNTER — Inpatient Hospital Stay: Payer: Medicaid Other | Attending: Hematology and Oncology

## 2019-12-11 DIAGNOSIS — Z95828 Presence of other vascular implants and grafts: Secondary | ICD-10-CM

## 2019-12-11 DIAGNOSIS — C773 Secondary and unspecified malignant neoplasm of axilla and upper limb lymph nodes: Secondary | ICD-10-CM | POA: Diagnosis not present

## 2019-12-11 DIAGNOSIS — D649 Anemia, unspecified: Secondary | ICD-10-CM | POA: Insufficient documentation

## 2019-12-11 DIAGNOSIS — C50411 Malignant neoplasm of upper-outer quadrant of right female breast: Secondary | ICD-10-CM | POA: Insufficient documentation

## 2019-12-11 DIAGNOSIS — Z170 Estrogen receptor positive status [ER+]: Secondary | ICD-10-CM | POA: Diagnosis not present

## 2019-12-11 LAB — IRON AND TIBC
Iron: 33 ug/dL (ref 28–170)
Saturation Ratios: 11 % (ref 10.4–31.8)
TIBC: 300 ug/dL (ref 250–450)
UIBC: 267 ug/dL

## 2019-12-11 LAB — FERRITIN: Ferritin: 97 ng/mL (ref 11–307)

## 2019-12-11 MED ORDER — SODIUM CHLORIDE 0.9% FLUSH
10.0000 mL | INTRAVENOUS | Status: DC | PRN
  Administered 2019-12-11: 10 mL via INTRAVENOUS
  Filled 2019-12-11: qty 10

## 2019-12-11 MED ORDER — HEPARIN SOD (PORK) LOCK FLUSH 100 UNIT/ML IV SOLN
500.0000 [IU] | Freq: Once | INTRAVENOUS | Status: AC
  Administered 2019-12-11: 500 [IU] via INTRAVENOUS
  Filled 2019-12-11: qty 5

## 2019-12-26 NOTE — Telephone Encounter (Signed)
ERROR

## 2020-01-22 ENCOUNTER — Ambulatory Visit: Payer: Medicaid Other | Attending: Oncology | Admitting: Occupational Therapy

## 2020-01-22 DIAGNOSIS — R209 Unspecified disturbances of skin sensation: Secondary | ICD-10-CM | POA: Insufficient documentation

## 2020-01-22 DIAGNOSIS — L905 Scar conditions and fibrosis of skin: Secondary | ICD-10-CM | POA: Insufficient documentation

## 2020-01-22 DIAGNOSIS — M79601 Pain in right arm: Secondary | ICD-10-CM | POA: Insufficient documentation

## 2020-01-22 DIAGNOSIS — I972 Postmastectomy lymphedema syndrome: Secondary | ICD-10-CM | POA: Insufficient documentation

## 2020-02-03 ENCOUNTER — Other Ambulatory Visit: Payer: Self-pay

## 2020-02-03 ENCOUNTER — Ambulatory Visit: Payer: Medicaid Other | Admitting: Occupational Therapy

## 2020-02-03 ENCOUNTER — Encounter: Payer: Self-pay | Admitting: Occupational Therapy

## 2020-02-03 DIAGNOSIS — R209 Unspecified disturbances of skin sensation: Secondary | ICD-10-CM | POA: Diagnosis present

## 2020-02-03 DIAGNOSIS — M79601 Pain in right arm: Secondary | ICD-10-CM | POA: Diagnosis present

## 2020-02-03 DIAGNOSIS — I972 Postmastectomy lymphedema syndrome: Secondary | ICD-10-CM | POA: Diagnosis present

## 2020-02-03 DIAGNOSIS — L905 Scar conditions and fibrosis of skin: Secondary | ICD-10-CM

## 2020-02-03 NOTE — Patient Instructions (Signed)
Pt to wear Jobst Elvarex soft sleeve and glove daytime - and ed on husband to donn correct and wearing correctly  And night time cont isotoner glove and tubigrip D and F - until night time garments come in And jovipak breast pad under bra during day   Cont over head shoulder AROM supine using cane  And if can do wall slides if not increase pain and numbness  cont scar massage

## 2020-02-03 NOTE — Therapy (Signed)
[facility] [address] [place], [address] Phone: [phone]   Fax:  [phone]  Occupational Therapy Reeval   Patient Details  Name: Chelsea Hudson MRN: [id number] Date of Birth: [birth year] Referring Provider (OT): Faythe Casa   Encounter Date: 02/03/2020  OT End of Session - 02/03/20 1117    Visit Number  3    Number of Visits  6    Date for OT Re-Evaluation  03/09/20    OT Start Time  0915    OT Stop Time  1000    OT Time Calculation (min)  45 min    Activity Tolerance  Patient tolerated treatment well    Behavior During Therapy  [facility] for tasks assessed/performed       Past Medical History:  Diagnosis Date  . Arthritis    SHOULDER  . Breast cancer ([place]) 02/2017   rt breast  . Cancer ([place]) 02/28/2017   INVASIVE MAMMARY CARCINOMA WITH MUCINOUS FEATURES.  . Colonic diverticular abscess 06/21/2017   Colonoscopy 06/25/2017: No evidence of malignancy.  . Diabetes mellitus without complication ([place])   . Hypertension   . Irregular heart beat    PT STATES IT "SKIPS A BEAT"   . Obesity   . Personal history of chemotherapy   . Personal history of radiation therapy     Past Surgical History:  Procedure Laterality Date  . ANKLE SURGERY    . BREAST BIOPSY Right 02/28/2017   INVASIVE MAMMARY CARCINOMA WITH MUCINOUS FEATURES.  Marland Kitchen BREAST CYST ASPIRATION Right    NEG  . COLONOSCOPY WITH PROPOFOL N/A 06/25/2017   Procedure: COLONOSCOPY WITH PROPOFOL;  Surgeon: Jonathon Bellows, MD;  [facility]: [facility] ENDOSCOPY;  Service: Gastroenterology;  Laterality: N/A;  . MASTECTOMY, PARTIAL Right 07/30/2017   Procedure: MASTECTOMY PARTIAL;  Surgeon: Robert Bellow, MD;  Location: [facility] ORS;  Service: General;  Laterality: Right;  . PORTACATH PLACEMENT Left 03/15/2017   Procedure: INSERTION PORT-A-CATH;  Surgeon: Robert Bellow, MD;  Location: [facility] ORS;  Service: General;  Laterality: Left;  . RE-EXCISION OF BREAST  LUMPECTOMY Right 08/17/2017    INVASIVE CARCINOMA EXTENDS TO NEW LATERAL MARGIN. /RE-EXCISION OF BREAST LUMPECTOMY;: Byrnett, Forest Gleason, MD;  [facility] ORS; General;  Laterality: Right;  . SENTINEL NODE BIOPSY Right 07/30/2017   Procedure: SENTINEL NODE BIOPSY;  Surgeon: Robert Bellow, MD;  Location: [facility] ORS;  Service: General;  Laterality: Right;  . SIMPLE MASTECTOMY WITH AXILLARY SENTINEL NODE BIOPSY Right 09/28/2017   Procedure: SIMPLE MASTECTOMY;  Surgeon: Robert Bellow, MD;  Location: [facility] ORS;  Service: General;  Laterality: Right;    There were no vitals filed for this visit.  Subjective Assessment - 02/03/20 1113    Subjective   I just got my day sleeve and glove about 2 wks ago - my husband helps me to put it on - feels okay - I am doing some of my exercises - shoulder better but still have pain and some episodes of numbness with using my R arm a lot with cleaning or over head activities - did not get my night time garments yet    Pertinent History  R breast CA in 22018/2019 with mastectomy -and about 15 axillary ln removed , had chemo that resulted in neuropathy but stable with gabapentin; Radation 2019 - and report having about year swelling in her R arm and under her arm - increase numbness/pins and needles the last 6 months to year , decrease  shoulder AROM over head - cannot hold arm up - rest her hand on her head    Patient Stated Goals  I want the swelling and numbness better in my arm , not to get worse - and pain in arm better so I can reach over head    Currently in Pain?  Yes    Pain Score  5     Pain Location  Arm    Pain Orientation  Right    Pain Descriptors / Indicators  Aching;Numbness;Pins and needles    Pain Type  Acute pain    Pain Onset  More than a month ago    Pain Frequency  Intermittent    Aggravating Factors   using R arm a lot and over head -          LYMPHEDEMA/ONCOLOGY QUESTIONNAIRE - 02/03/20 0928      Right Upper Extremity Lymphedema   15 cm  Proximal to Olecranon Process  43 cm    10 cm Proximal to Olecranon Process  40.8 cm    Olecranon Process  32.4 cm    15 cm Proximal to Ulnar Styloid Process  28 cm    10 cm Proximal to Ulnar Styloid Process  23.5 cm    Just Proximal to Ulnar Styloid Process  20 cm    Across Hand at [employer]  21 cm    At [place] of 2nd Digit  6.8 cm    At [facility] of Thumb  6.8 cm      Pt arrive with her jovipak unilateral breast pad in place on R thoracic - pt report helping for lymphedema under her arm  Wearing during day under her bra  she arrive with her Jobst Elvarex soft sleeve and glove- not all the way up and elbow to correct place   pt ed on donning and wearing correctly   Measurements still increase compare to L upper arm 3.8 cm , elbow 1.4 cm (these increase compare to Dec 2020)  and forearm and wrist less than 1 cm  Will reassess pt next week if measurements decrease and when night time compression garments come in     R UE shoulder flexion and ABD increase to 140 compare to eva - but cont to have some pain when using her R arm and numbness with use and over head - during ABD measurements she had some numbness Pt doing scar massage and review with pt - focus on R side of chest and under arm  Cont with some AAROM until PT eval for shoulder strengthening                 OT Education - 02/03/20 1117    Education Details  garments wearing and HEP    Person(s) Educated  Patient    Methods  Explanation;Demonstration;Tactile cues;Verbal cues;Handout    Comprehension  Verbal cues required;Returned demonstration;Verbalized understanding       OT Short Term Goals - 02/03/20 1227      OT SHORT TERM GOAL #1   Title  R UE circumference decrease in upper arm by 1-2 cm to get measured for day and night time compression garments    Baseline  see flowsheet - R UE decrease at all levels - await custom sleeves to come in    Time  2    Period  Weeks    Status  Achieved        OT Long  Term Goals - 02/03/20 1228  OT LONG TERM GOAL #1   Title  Pt to  be independent in use of daytime and night time compression garments to decrease and maintain circumference of R UE    Baseline  only has jovipak unilateral post mastectomy pad , and Jobst Elvarex soft sleeve and glove - but had it wrong on - and upper arms numbers were increase , await night time compression sleeve still - wil contacte DME    Time  5    Period  Weeks    Status  On-going    Target Date  03/09/20      OT LONG TERM GOAL #2   Title  Pt show increase R Shoulder AROM  by 20 degrees at least to be able to donn compression garments and decrease upper traps pain    Baseline  EVALpain increase to 9/10 in R UE , increase numbness, shoulder ABD 100 , upper traps tightness , limited by scar tissue ; NOW pain and numbness  - after using her arm alot, cleaning the house, or over head , AROM increase to 140 for shoulder flexion and ABD , upper traps pain decrease but still tight - refer to PT for eval and tx    Status  Deferred            Plan - 02/03/20 1221    Clinical Impression Statement  Pt was evaluated last year for Stage 2 lymphedema in R UE and thoracic lymphedema - pt arrive this date with wearing jovipak unilateral post mastectomy pad in place and working well for her lymphedema under her arm per pt - she also got her daytime Jobst Elvarex soft sleeve and glove about 2 wks ago - pt for garment and circumference assessment this date - pt in need for education how to donn and wear sleeve and glove correctly - pt upper arm was increase this date because sleeve was not all the way up and correct at elbow. Still waiting for her night time compression sleeve - did provded her with temporary tubigrip and isotoner glove for night time - pt to return next week for circumference check and need to follow up after night time sleeve arrive - Pt cont to have pain and numbness in R arm when using it a lot and over head- show  tight upper traps , rounded shoulder -AROM in shoulder did increase from 120 to 140 this date - but can benefit from PT for eval and tx for R shoulder strengthening s/p mastectomy    OT Occupational Profile and History  Problem Focused Assessment - Including review of records relating to presenting problem    Occupational performance deficits (Please refer to evaluation for details):  ADL's;IADL's;Rest and Sleep;Play;Work;Leisure;Social Participation    Body Structure / Function / Physical Skills  ADL;Decreased knowledge of precautions;Flexibility;ROM;UE functional use;Scar mobility;Sensation;Edema;Pain;Strength    Rehab Potential  Fair    Clinical Decision Making  Limited treatment options, no task modification necessary    Comorbidities Affecting Occupational Performance:  May have comorbidities impacting occupational performance    Modification or Assistance to Complete Evaluation   No modification of tasks or assist necessary to complete eval    OT Frequency  --   1 x wk for 1wks , biweekly for 4 wks   OT Treatment/Interventions  Self-care/ADL training;Manual lymph drainage;Therapeutic exercise;Compression bandaging;Patient/family education;Scar mobilization;Manual Therapy;Passive range of motion    Plan  assess cirrcumference in R UE  assess fit of custom sleeves    OT Home  Exercise Plan  see pt instruction    Consulted and Agree with Plan of Care  Patient       Patient will benefit from skilled therapeutic intervention in order to improve the following deficits and impairments:   Body Structure / Function / Physical Skills: ADL, Decreased knowledge of precautions, Flexibility, ROM, UE functional use, Scar mobility, Sensation, Edema, Pain, Strength       Visit Diagnosis: Postmastectomy lymphedema syndrome - Plan: Ot plan of care cert/re-cert  Scar condition and fibrosis of skin - Plan: Ot plan of care cert/re-cert  Pain in right arm - Plan: Ot plan of care cert/re-cert    Problem  List Patient Active Problem List   Diagnosis Date Noted  . 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 Chemotherapy-induced neuropathy ([place]) 01/29/2019  . Pyuria 11/07/2018  . Urinary urgency 11/07/2018  . Hyperlipidemia associated with type 2 diabetes mellitus ([place]) 10/02/2018  . Neuropathy due to chemotherapeutic drug ([place]) 10/02/2018  . Myalgia due to statin 10/02/2018  . Controlled type 2 diabetes mellitus with hyperglycemia ([place]) 07/05/2018  . Diverticulosis of large intestine without diverticulitis 03/01/2018  . Personal history of chemotherapy 01/03/2018  . Malignant neoplasm of upper-outer quadrant of right breast in female, estrogen receptor positive ([place]) 03/05/2017  . Goals of care, counseling/discussion 03/05/2017  . Essential hypertension 01/05/2016    Rosalyn Gess OTR/L,CLT 02/03/2020, 12:32 PM  [facility] [address] [place], [address] Phone: [phone]   Fax:  [phone]  Name: KAHLIE DEROCHE MRN: [id number] Date of Birth: 02-20-1966

## 2020-02-09 ENCOUNTER — Other Ambulatory Visit: Payer: Self-pay | Admitting: Oncology

## 2020-02-09 DIAGNOSIS — S4991XA Unspecified injury of right shoulder and upper arm, initial encounter: Secondary | ICD-10-CM

## 2020-02-09 NOTE — Progress Notes (Signed)
Re: Right shoulder weakness  Per Rosalyn Gess, orders placed for PT evaluation and treat at [facility] sports rehab.  Faythe Casa, NP 02/09/2020 10:26 AM

## 2020-02-10 NOTE — Progress Notes (Signed)
[facility]  [address], [address] [place], [place] [address] Phone: [phone]  Fax: [phone]   Clinic Day:  02/12/2020  Referring physician: No ref. provider found  Chief Complaint: Chelsea Hudson is a 54 y.o. female with stage IIIC right breast cancer who is seen for 8 month assessment.   HPI: The patient was last seen in the medical oncology clinic on 06/12/2019 as a new patient.  At that time, she was doing well. She had a residual Taxol neuropathy. Vasomotor symptoms were fading. Hematocrit 37.6, hemoglobin 11.8, platelets 376,000, WBC 8,100. Potassium 3.3. CA 27.29 was 21.1. She was encouraged to eat potassium rich foods.   Left unilateral mammogram on 10/13/2019 showed no evidence of malignancy.   Patient was last seen in the medical oncology clinic on 10/15/2019 by Faythe Casa, NP.  At that time, she was doing well. 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 She had large joint pain especially in right knee and bilateral shoulders. She had worsening right arm tingling/edema from shoulder to fingertips. Referral was sent to [facility] for lymphedema.   CBC was normal .  CA27.29 was 18.4 (normal).  Patient had a follow up with Dr. Dahlia Byes on 11/03/2019. She had no complaints. She was eating well. She did have some chronic lymphedema on the right upper extremity and she did have a wrap. Patient will follow up in 1 year with a mammogram.   Patient was seen by Dr. Nobie Putnam on 12/03/2019 for rectal bleeding. She felt nauseated. She had no pain or discomfort with bowel movements. She reported having 2-3 bowel movements a day. She noted a single episode of a large amount of blood in the stool.  She was advised to observe current symptoms and follow up as needed.  If symptoms recurred, she was to follow-up with her general surgeon who performed her surgery in [place].  Patient has been followed by Luna Fuse since 10/28/2019.  She recommended a tubigrip compression for her right arm. On 02/09/2020, she recommended PT evaluation and treatment at [facility] sports rehab.   Ferritin was 97 with an iron saturation on 11% and a TIBC of 300 on 12/11/2019.   During the interim, she has been doing ok. She states she recently had a nerve test for neuropathy; results showed severe carpal tunnel. She is also taking an allergy pill with Benadryl for sleep. Her hot flashes still persist. She notes mild improvement with Luna Fuse for lymphedema. She will start sports rehab in 02/2020. Her left arm will tingles when its lifted and right arm is by her side. She has some left neck pain that feels like a crick.   She denies any more episodes of rectal bleeding. She had no clots at the time of her bleeding episode back in 11/2019. 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 She performs monthly breast exams and has no breast concerns.   She is not interested in the COVID-19 vaccine.    Past Medical History:  Diagnosis Date  . Arthritis    SHOULDER  . Breast cancer ([place]) 02/2017   rt breast  . Cancer ([place]) 02/28/2017   INVASIVE MAMMARY CARCINOMA WITH MUCINOUS FEATURES.  . Colonic diverticular abscess 06/21/2017   Colonoscopy 06/25/2017: No evidence of malignancy.  . Diabetes mellitus without complication ([place])   . Hypertension   . Irregular heart beat    PT STATES IT "SKIPS A BEAT"   . Obesity   .  Personal history of chemotherapy   . Personal history of radiation therapy     Past Surgical History:  Procedure Laterality Date  . ANKLE SURGERY    . BREAST BIOPSY Right 02/28/2017   INVASIVE MAMMARY CARCINOMA WITH MUCINOUS FEATURES.  Marland Kitchen BREAST CYST ASPIRATION Right    NEG  . COLONOSCOPY WITH PROPOFOL N/A 06/25/2017   Procedure: COLONOSCOPY WITH PROPOFOL;  Surgeon: Jonathon Bellows, MD;  Location: [facility] ENDOSCOPY;  Service: Gastroenterology;  Laterality: N/A;  . MASTECTOMY, PARTIAL Right  07/30/2017   Procedure: MASTECTOMY PARTIAL;  Surgeon: Robert Bellow, MD;  Location: [facility] ORS;  Service: General;  Laterality: Right;  . PORTACATH PLACEMENT Left 03/15/2017   Procedure: INSERTION PORT-A-CATH;  Surgeon: Robert Bellow, MD;  Location: [facility] ORS;  Service: General;  Laterality: Left;  . RE-EXCISION OF BREAST LUMPECTOMY Right 08/17/2017    INVASIVE CARCINOMA EXTENDS TO NEW LATERAL MARGIN. /RE-EXCISION OF BREAST LUMPECTOMY;: Byrnett, Forest Gleason, MD;  [facility] ORS; General;  Laterality: Right;  . SENTINEL NODE BIOPSY Right 07/30/2017   Procedure: SENTINEL NODE BIOPSY;  Surgeon: Robert Bellow, MD;  Location: [facility] ORS;  Service: General;  Laterality: Right;  . SIMPLE MASTECTOMY WITH AXILLARY SENTINEL NODE BIOPSY Right 09/28/2017   Procedure: SIMPLE MASTECTOMY;  Surgeon: Robert Bellow, MD;  Location: [facility] ORS;  Service: General;  Laterality: Right;    Family History  Problem Relation Age of Onset  . Diabetes Father   . Stroke Father   . Breast cancer Mother 58  . Brain cancer Maternal Aunt 60  . Colon cancer Neg Hx     Social History:  reports that she quit smoking about 2 years ago. Her smoking use included cigarettes. She has a 6.25 pack-year smoking history. She has quit using smokeless tobacco. She reports current alcohol use. She reports that she does not use drugs. She is a Administrator. She denies any exposure to any toxins and radiations. The patient is alone today.  Allergies:  Allergies  Allergen Reactions  . Other Hives and Itching    [name]     Current Medications: Current Outpatient Medications  Medication Sig Dispense Refill  . Accu-Chek Softclix Lancets lancets USE TO CHECK GLUCOSE UP TO ONCE DAILY AS DIRECTED    . blood glucose meter kit and supplies Dispense based on patient and insurance preference. Use up to one time daily as directed. (FOR ICD-10  E10.9, E11.9). 1 each 0  . Blood Glucose Monitoring Suppl (ACCU-CHEK AVIVA PLUS) w/Device KIT USE UP TO ONE TIME DAILY AS DIRECTED    . diphenhydrAMINE HCl (BENADRYL ALLERGY PO) Take by mouth. For sleep    . gabapentin (NEURONTIN) 400 MG capsule Take 1 capsule (400 mg total) by mouth at bedtime. 90 capsule 1  . glucose blood (ACCU-CHEK AVIVA PLUS) test strip USE UP TO ONE TIME DAILY AS DIRECTED    . letrozole (FEMARA) 2.5 MG tablet Take 1 tablet by mouth once daily 90 tablet 3  . metFORMIN (GLUCOPHAGE) 500 MG tablet TAKE 2 TABLETS BY MOUTH ONCE DAILY WITH BREAKFAST AND 1 WITH SUPPER 270 tablet 1  . triamterene-hydrochlorothiazide (MAXZIDE-25) 37.5-25 MG tablet Take 1 tablet by mouth daily. 90 tablet 1   No current facility-administered medications for this visit.   Facility-Administered Medications Ordered in Other Visits  Medication Dose Route Frequency Provider Last Rate Last Admin  . sodium chloride flush (NS) 0.9 %  injection 10 mL  10 mL Intravenous PRN Lequita Asal, MD   10 mL at 02/12/20 1544    Review of Systems  Constitutional: Positive for weight loss (3 pounds). Negative for chills, fever and malaise/fatigue.       Doing ok.  HENT: Negative for congestion, ear pain, hearing loss, sore throat and tinnitus.   Eyes: Negative for blurred vision and double vision.  Respiratory: Negative for cough, hemoptysis, sputum production and shortness of breath.   Cardiovascular: Negative for chest pain, palpitations and leg swelling.  Gastrointestinal: Negative for abdominal pain, blood in stool, constipation, diarrhea, melena, nausea and vomiting.  Genitourinary: Negative for dysuria, frequency, hematuria and urgency.       Last menstrual cycle was 5 years ago.   Musculoskeletal: Positive for joint pain (arthritis in shoulders and knees) and neck pain (left sided). Negative for back pain and myalgias.       Lymphedema. Compression sleeve.  Skin: Negative for rash.  Neurological:  Positive for tingling (left arm). Negative for dizziness, sensory change, weakness and headaches.       Neuropathy not improving.  Carpal tunnel.  Endo/Heme/Allergies: Does not bruise/bleed easily.       Hot flashes.  Psychiatric/Behavioral: Negative for depression and memory loss. The patient has insomnia (on allergy pill with Benadryl). The patient is not nervous/anxious.   All other systems reviewed and are negative.  Performance status (ECOG): 0-1  Vitals Blood pressure 136/76, pulse 89, temperature 98.6 F (37 C), temperature source Tympanic, resp. rate 16, weight 243 lb 2.7 oz (110.3 kg), last menstrual period 01/31/2014, SpO2 100 %.   Physical Exam Vitals and nursing note reviewed.  Constitutional:      General: She is not in acute distress.    Appearance: She is well-developed and well-nourished. She is not diaphoretic.  HENT:     Head: Normocephalic and atraumatic.     Mouth/Throat:     Mouth: Oropharynx is clear and moist.     Pharynx: No oropharyngeal exudate.      Comments: Curly brown hair. Mask. Eyes:     General: No scleral icterus.    Extraocular Movements: EOM normal.     Conjunctiva/sclera: Conjunctivae normal.     Pupils: Pupils are equal, round, and reactive to light.     Comments: Brown eyes.  Neck:     Vascular: No JVD.  Cardiovascular:     Rate and Rhythm: Normal rate and regular rhythm.     Heart sounds: Normal heart sounds. No murmur heard.   Pulmonary:     Effort: Pulmonary effort is normal. No respiratory distress.     Breath sounds: Normal breath sounds. No wheezing or rales.     Comments: RIGHT mastectomy with areas of hypopigmentation. No erythema or nodularity.  LEFT breast with fibrocystic changes at 2 o'clock. Chest:     Chest wall: No tenderness.     Breasts:        Right: No inverted nipple, mass, nipple discharge, skin change or tenderness.        Left: No inverted nipple, mass, nipple discharge, skin change or tenderness.  Abdominal:      General: Bowel sounds are normal. There is no distension.     Palpations: Abdomen is soft. There is no mass.     Tenderness: There is no abdominal tenderness. There is no guarding or rebound.  Musculoskeletal:        General: Edema (chronic 1+ BLE) present. No tenderness. Normal range of  motion.     Cervical back: Normal range of motion and neck supple.  Lymphadenopathy:     Head:     Right side of head: No preauricular, posterior auricular or occipital adenopathy.     Left side of head: No preauricular, posterior auricular or occipital adenopathy.     Cervical: No cervical adenopathy.     Upper Body:  No axillary adenopathy present.    Right upper body: No supraclavicular adenopathy.     Left upper body: No supraclavicular adenopathy.     Lower Body: No right inguinal adenopathy. No left inguinal adenopathy.     Comments: Edge of thyroid palpable.  Skin:    General: Skin is warm and dry.     Findings: No erythema or rash.  Neurological:     Mental Status: She is alert and oriented to person, place, and time.  Psychiatric:        Mood and Affect: Mood and affect normal.        Behavior: Behavior normal.        Thought Content: Thought content normal.        Judgment: Judgment normal.    Infusion on 02/12/2020  Component Date Value Ref Range Status  . Sodium 02/12/2020 134* 135 - 145 mmol/L Final  . Potassium 02/12/2020 3.0* 3.5 - 5.1 mmol/L Final  . Chloride 02/12/2020 101  98 - 111 mmol/L Final  . CO2 02/12/2020 25  22 - 32 mmol/L Final  . Glucose, Bld 02/12/2020 116* 70 - 99 mg/dL Final   Glucose reference range applies only to samples taken after fasting for at least 8 hours.  . BUN 02/12/2020 18  6 - 20 mg/dL Final  . Creatinine, Ser 02/12/2020 0.84  0.44 - 1.00 mg/dL Final  . Calcium 02/12/2020 9.1  8.9 - 10.3 mg/dL Final  . Total Protein 02/12/2020 7.3  6.5 - 8.1 g/dL Final  . Albumin 02/12/2020 3.5  3.5 - 5.0 g/dL Final  . AST 02/12/2020 27  15 - 41 U/L Final  .  ALT 02/12/2020 30  0 - 44 U/L Final  . Alkaline Phosphatase 02/12/2020 86  38 - 126 U/L Final  . Total Bilirubin 02/12/2020 0.4  0.3 - 1.2 mg/dL Final  . GFR calc non Af Amer 02/12/2020 >60  >60 mL/min Final  . GFR calc Af Amer 02/12/2020 >60  >60 mL/min Final  . Anion gap 02/12/2020 8  5 - 15 Final   Performed at [facility], [address] [place], [place] [address]  . WBC 02/12/2020 10.1  4.0 - 10.5 K/uL Final  . RBC 02/12/2020 4.48  3.87 - 5.11 MIL/uL Final  . Hemoglobin 02/12/2020 11.8* 12.0 - 15.0 g/dL Final  . HCT 02/12/2020 36.7  36.0 - 46.0 % Final  . MCV 02/12/2020 81.9  80.0 - 100.0 fL Final  . MCH 02/12/2020 26.3  26.0 - 34.0 pg Final  . MCHC 02/12/2020 32.2  30.0 - 36.0 g/dL Final  . RDW 02/12/2020 17.4* 11.5 - 15.5 % Final  . Platelets 02/12/2020 378  150 - 400 K/uL Final  . nRBC 02/12/2020 0.0  0.0 - 0.2 % Final  . Neutrophils Relative % 02/12/2020 77  % Final  . Neutro Abs 02/12/2020 7.7  1.7 - 7.7 K/uL Final  . Lymphocytes Relative 02/12/2020 18  % Final  . Lymphs Abs 02/12/2020 1.8  0.7 - 4.0 K/uL Final  . Monocytes Relative 02/12/2020 4  % Final  . Monocytes Absolute  02/12/2020 0.4  0.1 - 1.0 K/uL Final  . Eosinophils Relative 02/12/2020 1  % Final  . Eosinophils Absolute 02/12/2020 0.1  0.0 - 0.5 K/uL Final  . Basophils Relative 02/12/2020 0  % Final  . Basophils Absolute 02/12/2020 0.0  0.0 - 0.1 K/uL Final  . Immature Granulocytes 02/12/2020 0  % Final  . Abs Immature Granulocytes 02/12/2020 0.04  0.00 - 0.07 K/uL Final   Performed at [facility], [address], [place], [place] [address]    Assessment:  DORATHEA FAERBER is a 54 y.o. 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  Biopsy on 02/28/2017 revealed invasive carcinoma with mucinous features.  Lymph node biopsy revealed metastatic disease.  Tumor was ER positive (90%), PR  positive (90%) and HER-2/neu negative.  She had clinical stage T3N3aM0 breast cancer.  PET scan on 03/09/2017 revealed hypermetabolic right axillary/subpectoral adenopathy.  There was low-grade activity in the right upper breast likely at the postoperative site.  Appearance was compatible with metastatic spread to right axillary/subpectoral lymph nodes.  There was some sigmoid colon diverticulosis, with faint inflammatory findings adjacent to the sigmoid colon proximally, and with accentuated activity in the involved segment of the sigmoid colon.   Partial mastectomy and sentinel lymph node biopsy on 07/30/2017 revealed a 1.8 cm invasive mammary carcinoma with mucinous features.  There was lymphovascular invasion.  Eight of 12 lymph nodes were positive for metastatic disease.   She required multiple excisions to obtain clear margins resulting in a full mastectomy on 09/28/2017.  She underwent complete axillary node dissection on 03/04/2018.  Level 2 and 3 lymph node dissection revealed 3 of 3 lymph nodes positive for carcinoma.  The largest tumor deposit was at least 20 mm.  Extracapsular extension was present < 1 mm beyond the lymph node capsule.   Mammogram and ultrasound on 02/09/2017 revealed an irregular 2.1 x 1.0 x 0.9 cm hypoechoic mass with a feeding vessel 11:00, 8 cm from the nipple.  There was overlying skin thickness.  Ultrasound of the right axilla revealed at least 3 bulky lymph nodes deep to the pectoralis (level 2 lymphadenopathy).  Lymph node measured 1.6 x 1.4 x 1.9 cm and 2 x 1.7 x 1.8 cm.  She received 4 cycles of AC (03/22/2017 - 05/02/2017) and 7 of 12 cycles of neoadjuvant Taxol (05/16/2017 - 06/27/2017).  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  She received adjuvant [facility] at [facility].  She began Femara in 05/2018.  Left mammogram on 10/09/2018 revealed no suspicious masses, asymmetries, or malignant calcifications.  Left unilateral mammogram on  10/13/2019 showed no evidence of malignancy.    CA27.29 has been followed: 21.1 on 06/12/2019, 18.4 on 10/15/2019, and 16.8 on 02/12/2020.  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  Bone density on 07/17/2018 was normal with a T-score of -0.3 at the AP spine L1-L4.  She is not interested in the COVID-19 vaccine.   Symptomatically, she feels "ok".  She has lymphedema.  She denies any further rectal bleeding.  Potassium is 3.0.  Plan: 1.   Labs today: CBC with diff, CMP, CA 27.29. 2.   Stage IIIC right breast cancer             She received neoadjuvant chemotherapy (4 cycles of AC and 7 weeks of Taxol).  She underwent completion mastectomy and radiation.             She began Femara on 05/2018.             Clinically, she continues to do well without recurrent disease.             Left unilateral mammogram on 10/13/2019 showed no evidence of malignancy.  Continue surveillance. 3.   Chemotherapy neuropathy             Review interval neurology note (02/10/2020).  She has severe right carpal tunnel syndrome.             She has been referred for carpal release syndrome. 4.   Hypokalemia  Rx: potassium 20 meq q day x 3 days.  RN to contact PCP re: hypokalemia. 5.   RTC in 6 months for MD assessment and labs (CBC with diff, CMP, CA27.29).   I discussed the assessment and treatment plan with the patient.  The patient was provided an opportunity to ask questions and all were answered.  The patient agreed with the plan and demonstrated an understanding of the instructions.  The patient was advised to call back if the symptoms worsen or if the condition fails to improve as anticipated.  I provided 18 minutes of face-to-face time during this encounter and > 50% was spent counseling as documented under my assessment and plan.  An additional 8-10 minutes were spent reviewing her chart (Epic and Care Everywhere) including notes, labs, and  imaging studies.    Lequita Asal, MD, PhD    02/12/2020, 4:18 PM  I, Selena Batten, am acting as scribe for [employer]. Mike Gip, MD, PhD.  I, Fermon Ureta C. Mike Gip, MD, have reviewed the above documentation for accuracy and completeness, and I agree with the above.

## 2020-02-11 ENCOUNTER — Other Ambulatory Visit: Payer: Self-pay

## 2020-02-11 NOTE — Progress Notes (Signed)
Confirmed patient name and DOB. She states she recently had a nerve test for neuropathy. She is also taking a allergy pill with benadryl for sleep

## 2020-02-12 ENCOUNTER — Ambulatory Visit: Payer: Medicaid Other | Admitting: Occupational Therapy

## 2020-02-12 ENCOUNTER — Other Ambulatory Visit: Payer: Self-pay

## 2020-02-12 ENCOUNTER — Inpatient Hospital Stay: Payer: Medicaid Other | Attending: Hematology and Oncology | Admitting: Hematology and Oncology

## 2020-02-12 ENCOUNTER — Inpatient Hospital Stay: Payer: Medicaid Other

## 2020-02-12 VITALS — BP 136/76 | HR 89 | Temp 98.6°F | Resp 16 | Wt 243.2 lb

## 2020-02-12 DIAGNOSIS — Z170 Estrogen receptor positive status [ER+]: Secondary | ICD-10-CM | POA: Diagnosis not present

## 2020-02-12 DIAGNOSIS — C50411 Malignant neoplasm of upper-outer quadrant of right female breast: Secondary | ICD-10-CM | POA: Insufficient documentation

## 2020-02-12 DIAGNOSIS — I972 Postmastectomy lymphedema syndrome: Secondary | ICD-10-CM | POA: Diagnosis not present

## 2020-02-12 DIAGNOSIS — E876 Hypokalemia: Secondary | ICD-10-CM | POA: Diagnosis not present

## 2020-02-12 DIAGNOSIS — R208 Other disturbances of skin sensation: Secondary | ICD-10-CM

## 2020-02-12 DIAGNOSIS — L905 Scar conditions and fibrosis of skin: Secondary | ICD-10-CM

## 2020-02-12 LAB — COMPREHENSIVE METABOLIC PANEL
ALT: 30 U/L (ref 0–44)
AST: 27 U/L (ref 15–41)
Albumin: 3.5 g/dL (ref 3.5–5.0)
Alkaline Phosphatase: 86 U/L (ref 38–126)
Anion gap: 8 (ref 5–15)
BUN: 18 mg/dL (ref 6–20)
CO2: 25 mmol/L (ref 22–32)
Calcium: 9.1 mg/dL (ref 8.9–10.3)
Chloride: 101 mmol/L (ref 98–111)
Creatinine, Ser: 0.84 mg/dL (ref 0.44–1.00)
GFR calc Af Amer: >60 mL/min (ref >60)
GFR calc non Af Amer: >60 mL/min (ref >60)
Glucose, Bld: 116 mg/dL — ABNORMAL HIGH (ref 70–99)
Potassium: 3 mmol/L — ABNORMAL LOW (ref 3.5–5.1)
Sodium: 134 mmol/L — ABNORMAL LOW (ref 135–145)
Total Bilirubin: 0.4 mg/dL (ref 0.3–1.2)
Total Protein: 7.3 g/dL (ref 6.5–8.1)

## 2020-02-12 LAB — CBC WITH DIFFERENTIAL/PLATELET
Abs Immature Granulocytes: 0.04 10*3/uL (ref 0.00–0.07)
Basophils Absolute: 0 10*3/uL (ref 0.0–0.1)
Basophils Relative: 0 %
Eosinophils Absolute: 0.1 10*3/uL (ref 0.0–0.5)
Eosinophils Relative: 1 %
HCT: 36.7 % (ref 36.0–46.0)
Hemoglobin: 11.8 g/dL — ABNORMAL LOW (ref 12.0–15.0)
Immature Granulocytes: 0 %
Lymphocytes Relative: 18 %
Lymphs Abs: 1.8 10*3/uL (ref 0.7–4.0)
MCH: 26.3 pg (ref 26.0–34.0)
MCHC: 32.2 g/dL (ref 30.0–36.0)
MCV: 81.9 fL (ref 80.0–100.0)
Monocytes Absolute: 0.4 10*3/uL (ref 0.1–1.0)
Monocytes Relative: 4 %
Neutro Abs: 7.7 10*3/uL (ref 1.7–7.7)
Neutrophils Relative %: 77 %
Platelets: 378 10*3/uL (ref 150–400)
RBC: 4.48 MIL/uL (ref 3.87–5.11)
RDW: 17.4 % — ABNORMAL HIGH (ref 11.5–15.5)
WBC: 10.1 10*3/uL (ref 4.0–10.5)
nRBC: 0 % (ref 0.0–0.2)

## 2020-02-12 MED ORDER — POTASSIUM CHLORIDE CRYS ER 10 MEQ PO TBCR: 10.0000 meq | EXTENDED_RELEASE_TABLET | Freq: Every day | ORAL | 0 refills | Status: DC

## 2020-02-12 MED ORDER — HEPARIN SOD (PORK) LOCK FLUSH 100 UNIT/ML IV SOLN
500.0000 [IU] | Freq: Once | INTRAVENOUS | Status: AC
  Administered 2020-02-12: 500 [IU] via INTRAVENOUS
  Filled 2020-02-12: qty 5

## 2020-02-12 MED ORDER — SODIUM CHLORIDE 0.9% FLUSH
10.0000 mL | INTRAVENOUS | Status: DC | PRN
  Administered 2020-02-12: 10 mL via INTRAVENOUS
  Filled 2020-02-12: qty 10

## 2020-02-12 NOTE — Therapy (Signed)
[facility] [address], [place], [address] Phone: [phone]   Fax:  [phone]  Occupational Therapy Treatment  Patient Details  Name: Chelsea Hudson MRN: [id number] Date of Birth: 1965-12-10 Referring Provider (OT): Faythe Casa   Encounter Date: 02/12/2020  OT End of Session - 02/12/20 1625    Visit Number  4    Number of Visits  6    Date for OT Re-Evaluation  03/09/20    OT Start Time  [id number]    OT Stop Time  1428    OT Time Calculation (min)  41 min    Activity Tolerance  Patient tolerated treatment well    Behavior During Therapy  [facility] for tasks assessed/performed       Past Medical History:  Diagnosis Date  . Arthritis    SHOULDER  . Breast cancer ([place]) 02/2017   rt breast  . Cancer ([place]) 02/28/2017   INVASIVE MAMMARY CARCINOMA WITH MUCINOUS FEATURES.  . Colonic diverticular abscess 06/21/2017   Colonoscopy 06/25/2017: No evidence of malignancy.  . Diabetes mellitus without complication ([place])   . Hypertension   . Irregular heart beat    PT STATES IT "SKIPS A BEAT"   . Obesity   . Personal history of chemotherapy   . Personal history of radiation therapy     Past Surgical History:  Procedure Laterality Date  . ANKLE SURGERY    . BREAST BIOPSY Right 02/28/2017   INVASIVE MAMMARY CARCINOMA WITH MUCINOUS FEATURES.  Marland Kitchen BREAST CYST ASPIRATION Right    NEG  . COLONOSCOPY WITH PROPOFOL N/A 06/25/2017   Procedure: COLONOSCOPY WITH PROPOFOL;  Surgeon: Jonathon Bellows, MD;  Location: [facility] ENDOSCOPY;  Service: Gastroenterology;  Laterality: N/A;  . MASTECTOMY, PARTIAL Right 07/30/2017   Procedure: MASTECTOMY PARTIAL;  Surgeon: Robert Bellow, MD;  Location: [facility] ORS;  Service: General;  Laterality: Right;  . PORTACATH PLACEMENT Left 03/15/2017   Procedure: INSERTION PORT-A-CATH;  Surgeon: Robert Bellow, MD;  Location: [facility] ORS;  Service: General;  Laterality: Left;  . RE-EXCISION OF BREAST  LUMPECTOMY Right 08/17/2017    INVASIVE CARCINOMA EXTENDS TO NEW LATERAL MARGIN. /RE-EXCISION OF BREAST LUMPECTOMY;: Byrnett, Forest Gleason, MD;  [facility] ORS; General;  Laterality: Right;  . SENTINEL NODE BIOPSY Right 07/30/2017   Procedure: SENTINEL NODE BIOPSY;  Surgeon: Robert Bellow, MD;  Location: [facility] ORS;  Service: General;  Laterality: Right;  . SIMPLE MASTECTOMY WITH AXILLARY SENTINEL NODE BIOPSY Right 09/28/2017   Procedure: SIMPLE MASTECTOMY;  Surgeon: Robert Bellow, MD;  Location: [facility] ORS;  Service: General;  Laterality: Right;    There were no vitals filed for this visit.  Subjective Assessment - 02/12/20 1623    Subjective   I wore the sleeve but the glove was to tight - could not wear it - about 25 % but the sleeve most of the day and night tine those stockinetter you gave me - did not hear back about my night sleeve yet    Pertinent History  R breast CA in 22018/2019 with mastectomy -and about 15 axillary ln removed , had chemo that resulted in neuropathy but stable with gabapentin; Radation 2019 - and report having about year swelling in her R arm and under her arm - increase numbness/pins and needles the last 6 months to year , decrease shoulder AROM over head - cannot hold arm up - rest her hand on her head    Patient Stated Goals  I want the swelling and numbness better in my arm , not to get worse - and pain in arm better so I can reach over head    Currently in Pain?  No/denies          LYMPHEDEMA/ONCOLOGY QUESTIONNAIRE - 02/12/20 1347      Right Upper Extremity Lymphedema   15 cm Proximal to Olecranon Process  42.5 cm    10 cm Proximal to Olecranon Process  39.4 cm    Olecranon Process  32.2 cm    15 cm Proximal to Ulnar Styloid Process  27.4 cm    10 cm Proximal to Ulnar Styloid Process  23.4 cm    Just Proximal to Ulnar Styloid Process  20 cm    Across Hand at [employer]  20.8 cm    At [place] of 2nd Digit  6.5 cm    At [facility] of Thumb  6.8 cm          Pt arrive with her jovipak unilateral breast pad in place on R thoracic - pt report helping for lymphedema under her arm  Wearing during day under her bra  she arrive with her Jobst Elvarex soft sleeve and glove- correctly wearing - but hand appear increase in circumference -pt report that her glove to tight and she takes if off - only wearing about 25%  Pt ed on need to wear sleeve and glove - pt to wash and stretch fingers of glove and try again in 24 to 48 hrs    Measurements decrease in upper arm compare to last time  Await still night time compression garments  In the meantime fitted with new isotoner glove to wear and tubigrip D and E for hand to elbow and forearm to axilla                OT Education - 02/12/20 1625    Education Details  garments wearing and HEP    Person(s) Educated  Patient    Methods  Explanation;Demonstration;Tactile cues;Verbal cues;Handout    Comprehension  Verbal cues required;Returned demonstration;Verbalized understanding       OT Short Term Goals - 02/03/20 1227      OT SHORT TERM GOAL #1   Title  R UE circumference decrease in upper arm by 1-2 cm to get measured for day and night time compression garments    Baseline  see flowsheet - R UE decrease at all levels - await custom sleeves to come in    Time  2    Period  Weeks    Status  Achieved        OT Long Term Goals - 02/03/20 1228      OT LONG TERM GOAL #1   Title  Pt to  be independent in use of daytime and night time compression garments to decrease and maintain circumference of R UE    Baseline  only has jovipak unilateral post mastectomy pad , and Jobst Elvarex soft sleeve and glove - but had it wrong on - and upper arms numbers were increase , await night time compression sleeve still - wil contacte DME    Time  5    Period  Weeks    Status  On-going    Target Date  03/09/20      OT LONG TERM GOAL #2   Title  Pt show increase R Shoulder AROM  by 20 degrees at least to  be able to donn compression garments and  decrease upper traps pain    Baseline  EVALpain increase to 9/10 in R UE , increase numbness, shoulder ABD 100 , upper traps tightness , limited by scar tissue ; NOW pain and numbness  - after using her arm alot, cleaning the house, or over head , AROM increase to 140 for shoulder flexion and ABD , upper traps pain decrease but still tight - refer to PT for eval and tx    Status  Deferred            Plan - 02/12/20 1626    Clinical Impression Statement  Pt arrive with daytime compression sleeve and glove on - but report that glove to tight - only wearing 25% - pt to wash and will try again wearing in 24hrs - and wear temporary comrpession - still waiting for medicaid approval for night time compression sleeve - pt hand and wrist still increase - upper arm decreased    OT Occupational Profile and History  Problem Focused Assessment - Including review of records relating to presenting problem    Occupational performance deficits (Please refer to evaluation for details):  ADL's;IADL's;Rest and Sleep;Play;Work;Leisure;Social Participation    Body Structure / Function / Physical Skills  ADL;Decreased knowledge of precautions;Flexibility;ROM;UE functional use;Scar mobility;Sensation;Edema;Pain;Strength    Rehab Potential  Fair    Clinical Decision Making  Limited treatment options, no task modification necessary    Comorbidities Affecting Occupational Performance:  May have comorbidities impacting occupational performance    Modification or Assistance to Complete Evaluation   No modification of tasks or assist necessary to complete eval    OT Frequency  Biweekly    OT Duration  2 weeks    OT Treatment/Interventions  Self-care/ADL training;Manual lymph drainage;Therapeutic exercise;Compression bandaging;Patient/family education;Scar mobilization;Manual Therapy;Passive range of motion    Plan  assess cirrcumference in R UE  assess fit of custom sleeves    OT  Home Exercise Plan  see pt instruction    Consulted and Agree with Plan of Care  Patient       Patient will benefit from skilled therapeutic intervention in order to improve the following deficits and impairments:   Body Structure / Function / Physical Skills: ADL, Decreased knowledge of precautions, Flexibility, ROM, UE functional use, Scar mobility, Sensation, Edema, Pain, Strength       Visit Diagnosis: Postmastectomy lymphedema syndrome  Scar condition and fibrosis of skin  Other disturbances of skin sensation    Problem List Patient Active Problem List   Diagnosis Date Noted  . 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 Chemotherapy-induced neuropathy ([place]) 01/29/2019  . Pyuria 11/07/2018  . Urinary urgency 11/07/2018  . Hyperlipidemia associated with type 2 diabetes mellitus ([place]) 10/02/2018  . Neuropathy due to chemotherapeutic drug ([place]) 10/02/2018  . Myalgia due to statin 10/02/2018  . Controlled type 2 diabetes mellitus with hyperglycemia ([place]) 07/05/2018  . Diverticulosis of large intestine without diverticulitis 03/01/2018  . Personal history of chemotherapy 01/03/2018  . Malignant neoplasm of upper-outer quadrant of right breast in female, estrogen receptor positive ([place]) 03/05/2017  . Goals of care, counseling/discussion 03/05/2017  . Essential hypertension 01/05/2016    Rosalyn Gess OTR/L,CLT 02/12/2020, 4:30 PM  [facility] [address] [place], [address] Phone: [phone]   Fax:  [phone]  Name: Chelsea Hudson MRN: [id number] Date of Birth: [birth year]

## 2020-02-12 NOTE — Patient Instructions (Signed)
Wear isotoner glove and tubigrip D and E for 24 hrs - while washing and stretching fingers of glove And then try wearing compression sleeve and glove  DME said they are waiting for night time garments approval from [facility]

## 2020-02-13 ENCOUNTER — Telehealth: Payer: Self-pay

## 2020-02-13 LAB — CANCER ANTIGEN 27.29: CA 27.29: 16.8 U/mL (ref 0.0–38.6)

## 2020-02-13 NOTE — Telephone Encounter (Signed)
-----   Message from Arlan Organ, RN sent at 02/12/2020  4:53 PM EDT ----- Regarding: Call PCP Please read patient's wrap up. Dr. Mike Gip wants PCP office to take over checking potassium and possibly placing patient on potassium pill if needed. We called in a 3 day script of Potassium 20 MEQ but Dr. Mike Gip does not want to maintain RX. Office was closed when I tried to call.   Thanks. Hope you have a great Friday!

## 2020-02-13 NOTE — Telephone Encounter (Signed)
Labs have been faxed to PCP office.

## 2020-02-13 NOTE — Telephone Encounter (Signed)
I have tried to reach out to the patient PCP office. Unable to get someone on the phone.  I faxed over a note to the PCP. I have informed the office that the patient potassium was low in office and Per Dr Mike Gip she has started the patient on potassium x 3 days. Per Dr Mike Gip she would like for the PCP to handle the patient potassium and medication script. I have also routed a note to the PCP today about her levels alone with her labs.

## 2020-02-13 NOTE — Telephone Encounter (Signed)
Spoke with the patient to inform her that i have reached out to her PCP about her potassium and if she don't hear from them by Monday afternoon please call me at the office so i can get them to see you for the potassium ( hypokalemia). The patient was understanding and agreeable.

## 2020-02-16 ENCOUNTER — Telehealth: Payer: Self-pay

## 2020-02-16 NOTE — Telephone Encounter (Signed)
-----   Message from Verl Bangs, FNP sent at 02/16/2020 12:27 PM EDT ----- Can you schedule her for a visit in clinic?  I had received notification from Dr. Kem Parkinson office regarding low potassium and needing follow up in clinic.  Thanks

## 2020-02-16 NOTE — Telephone Encounter (Signed)
The pt was notified and appt scheduled for tomorrow to address concerns.

## 2020-02-17 ENCOUNTER — Other Ambulatory Visit: Payer: Self-pay

## 2020-02-17 ENCOUNTER — Encounter: Payer: Self-pay | Admitting: Family Medicine

## 2020-02-17 ENCOUNTER — Ambulatory Visit: Payer: Medicaid Other | Admitting: Family Medicine

## 2020-02-17 VITALS — BP 131/85 | HR 89 | Temp 97.1°F | Ht 66.0 in | Wt 249.6 lb

## 2020-02-17 DIAGNOSIS — G620 Drug-induced polyneuropathy: Secondary | ICD-10-CM

## 2020-02-17 DIAGNOSIS — E1165 Type 2 diabetes mellitus with hyperglycemia: Secondary | ICD-10-CM | POA: Diagnosis not present

## 2020-02-17 DIAGNOSIS — R319 Hematuria, unspecified: Secondary | ICD-10-CM

## 2020-02-17 DIAGNOSIS — E876 Hypokalemia: Secondary | ICD-10-CM | POA: Diagnosis not present

## 2020-02-17 DIAGNOSIS — I10 Essential (primary) hypertension: Secondary | ICD-10-CM | POA: Diagnosis not present

## 2020-02-17 DIAGNOSIS — T451X5A Adverse effect of antineoplastic and immunosuppressive drugs, initial encounter: Secondary | ICD-10-CM

## 2020-02-17 DIAGNOSIS — Z6841 Body Mass Index (BMI) 40.0 and over, adult: Secondary | ICD-10-CM

## 2020-02-17 LAB — POCT URINALYSIS DIPSTICK
Bilirubin, UA: NEGATIVE
Glucose, UA: NEGATIVE
Ketones, UA: NEGATIVE
Leukocytes, UA: NEGATIVE
Nitrite, UA: NEGATIVE
Protein, UA: NEGATIVE
Spec Grav, UA: 1.015 (ref 1.010–1.025)
Urobilinogen, UA: 0.2 E.U./dL
pH, UA: 5 (ref 5.0–8.0)

## 2020-02-17 LAB — POCT GLYCOSYLATED HEMOGLOBIN (HGB A1C): Hemoglobin A1C: 6.9 % — AB (ref 4.0–5.6)

## 2020-02-17 MED ORDER — TRIAMTERENE-HCTZ 37.5-25 MG PO TABS: 1.0000 | ORAL_TABLET | Freq: Every day | ORAL | 1 refills | Status: DC

## 2020-02-17 MED ORDER — POTASSIUM CHLORIDE CRYS ER 10 MEQ PO TBCR: 10.0000 meq | EXTENDED_RELEASE_TABLET | Freq: Two times a day (BID) | ORAL | 0 refills | Status: DC

## 2020-02-17 MED ORDER — METFORMIN HCL 500 MG PO TABS: ORAL_TABLET | ORAL | 1 refills | Status: DC

## 2020-02-17 MED ORDER — GABAPENTIN 400 MG PO CAPS: 400.0000 mg | ORAL_CAPSULE | Freq: Every day | ORAL | 1 refills | Status: DC

## 2020-02-17 NOTE — Progress Notes (Signed)
Subjective:    Patient ID: Chelsea Hudson, female    DOB: Jun 07, 1966, 54 y.o.   MRN: [id number]  Chelsea Hudson is a 54 y.o. female presenting on 02/17/2020 for Diabetes (abnormal potassium levels )   HPI  Hypertension - She is not checking BP at home or outside of clinic.    - Current medications: triameterene-hydrochlorothiazide 37.5-25mg  tablet once daily, tolerating with side effects of lab results with hypokalemia - She is not currently symptomatic. - Pt denies headache, lightheadedness, dizziness, changes in vision, chest tightness/pressure, palpitations, leg swelling, sudden loss of speech or loss of consciousness. - She  reports no regular exercise routine. - Her diet is moderate in salt, moderate in fat, and moderate in carbohydrates.  Diabetes Pt presents today for follow up Type 2 Diabetes Mellitus.  He/she (caps): She is checking AM CBG.. -Current diabetic medications include: metformin 500mg  tablet twice daily -ACTION; IS/IS NOT: is not currently symptomatic -Actions; denies/reports/admits to: denies polydipsia, polyphagia, polyuria, headaches, diaphoresis, shakiness, chills, pain, numbness or tingling in extremities or changes in vision -Clinical course has been stable -Reports no exercise routine -Diet is moderate in salt, moderate in fat, and moderate in carbohydrates  PREVENTION Eye exam current (within 1 year) Due - encouraged to schedule Foot exam current (within 1 year) Completed today Lipid/ASCVD risk reduction - on statin: YES/NO: No  Kidney Protection (On ACE/ARB)? YES/NO: No    Reports she has had her labs checked with her hematology/oncology provider with persistent hypokalemia.  Was started on potassium chloride 50mEq daily, taking her first dose yesterday and tolerating well.  Denies any weakness, fatigue, muscle cramps/pain, polyuria, palpitations, psychosis, delirium, hallucinations or depression.   Depression screen [facility] 2/9 12/03/2019 09/26/2018  05/24/2018  Decreased Interest 0 0 0  Down, Depressed, Hopeless 0 0 0  PHQ - 2 Score 0 0 0  Altered sleeping - - -  Tired, decreased energy - - -  Change in appetite - - -  Feeling bad or failure about yourself  - - -  Trouble concentrating - - -  Moving slowly or fidgety/restless - - -  Suicidal thoughts - - -  PHQ-9 Score - - -    Social History   Tobacco Use  . Smoking status: Former Smoker    Packs/day: 0.25    Years: 25.00    Pack years: 6.25    Types: Cigarettes    Quit date: 10/20/2017    Years since quitting: 2.3  . Smokeless tobacco: Former Network engineer Use Topics  . Alcohol use: Yes    Comment: occas  . Drug use: No    Review of Systems  Constitutional: Negative.   HENT: Negative.   Eyes: Negative.   Respiratory: Negative.   Cardiovascular: Negative.   Gastrointestinal: Negative.   Endocrine: Negative.   Genitourinary: Negative.   Musculoskeletal: Negative.   Skin: Negative.   Allergic/Immunologic: Negative.   Neurological: Negative.   Hematological: Negative.   Psychiatric/Behavioral: Negative.    Per HPI unless specifically indicated above     Objective:    BP 131/85   Pulse 89   Temp (!) 97.1 F (36.2 C) (Temporal)   Ht 5\' 6"  (1.676 m)   Wt 249 lb 9.6 oz (113.2 kg)   LMP 01/31/2014 (Approximate) Comment: LMP was 3 years ago.  BMI 40.29 kg/m   Wt Readings from Last 3 Encounters:  02/17/20 249 lb 9.6 oz (113.2 kg)  02/12/20 243 lb 2.7 oz (110.3 kg)  11/03/19  241 lb (109.3 kg)    Physical Exam Vitals reviewed.  Constitutional:      General: She is not in acute distress.    Appearance: Normal appearance. She is well-developed and well-groomed. She is obese. She is not ill-appearing or toxic-appearing.  HENT:     Head: Normocephalic.     Right Ear: Tympanic membrane, ear canal and external ear normal. There is no impacted cerumen.     Left Ear: Tympanic membrane, ear canal and external ear normal. There is no impacted cerumen.      Nose: Nose normal. No congestion or rhinorrhea.     Mouth/Throat:     Lips: Pink.     Mouth: Mucous membranes are moist.     Pharynx: Oropharynx is clear. Uvula midline. No oropharyngeal exudate or posterior oropharyngeal erythema.  Eyes:     General: Lids are normal. Vision grossly intact. No scleral icterus.       Right eye: No discharge.        Left eye: No discharge.     Extraocular Movements: Extraocular movements intact.     Conjunctiva/sclera: Conjunctivae normal.     Pupils: Pupils are equal, round, and reactive to light.  Neck:     Thyroid: No thyroid mass or thyromegaly.  Cardiovascular:     Rate and Rhythm: Normal rate and regular rhythm.     Pulses: Normal pulses.          Dorsalis pedis pulses are 2+ on the right side and 2+ on the left side.       Posterior tibial pulses are 2+ on the right side and 2+ on the left side.     Heart sounds: Normal heart sounds. No murmur. No friction rub. No gallop.   Pulmonary:     Effort: Pulmonary effort is normal. No respiratory distress.     Breath sounds: Normal breath sounds.  Abdominal:     General: Abdomen is flat. Bowel sounds are normal. There is no distension.     Palpations: Abdomen is soft. There is no hepatomegaly, splenomegaly or mass.     Tenderness: There is no abdominal tenderness. There is no guarding or rebound.     Hernia: No hernia is present.  Musculoskeletal:        General: Normal range of motion.     Cervical back: Normal range of motion and neck supple. No tenderness.     Right lower leg: No edema.     Left lower leg: No edema.  Feet:     Right foot:     Skin integrity: Skin integrity normal.     Left foot:     Skin integrity: Skin integrity normal.     Comments: Normal monofilament exam. Absent of callus and all other lesions.  Some dry skin noted, but pt reports regular foot care and can see bottom of her feet.  Lymphadenopathy:     Cervical: No cervical adenopathy.  Skin:    General: Skin is warm  and dry.     Capillary Refill: Capillary refill takes less than 2 seconds.  Neurological:     General: No focal deficit present.     Mental Status: She is alert and oriented to person, place, and time.     Cranial Nerves: No cranial nerve deficit.     Sensory: No sensory deficit.     Motor: No weakness.     Coordination: Coordination normal.     Gait: Gait normal.     Deep  Tendon Reflexes: Reflexes normal.  Psychiatric:        Attention and Perception: Attention and perception normal.        Mood and Affect: Mood and affect normal.        Speech: Speech normal.        Behavior: Behavior normal. Behavior is cooperative.        Thought Content: Thought content normal.        Cognition and Memory: Cognition and memory normal.        Judgment: Judgment normal.     Results for orders placed or performed in visit on 02/17/20  POCT Urinalysis Dipstick  Result Value Ref Range   Color, UA Yellow    Clarity, UA clear    Glucose, UA Negative Negative   Bilirubin, UA Negative    Ketones, UA negative    Spec Grav, UA 1.015 1.010 - 1.025   Blood, UA Trace    pH, UA 5.0 5.0 - 8.0   Protein, UA Negative Negative   Urobilinogen, UA 0.2 0.2 or 1.0 E.U./dL   Nitrite, UA Negative    Leukocytes, UA Negative Negative   Appearance     Odor    POCT glycosylated hemoglobin (Hb A1C)  Result Value Ref Range   Hemoglobin A1C 6.9 (A) 4.0 - 5.6 %   HbA1c POC (<> result, manual entry)     HbA1c, POC (prediabetic range)     HbA1c, POC (controlled diabetic range)        Assessment & Plan:   Problem List Items Addressed This Visit      Cardiovascular and Mediastinum   Essential hypertension    Slightly elevated hypertension in clinic today.  BP goal < 130/80.  Pt not working on lifestyle modifications.  Taking medications tolerating well without side effects.  Plan: 1. Continue taking triamterene-hydrochlorothiazide 37.5-25mg  tablet daily 2. Obtain labs next week 3. Encouraged heart healthy  diet and increasing exercise to 30 minutes most days of the week, going no more than 2 days in a row without exercise. 4. Check BP 1-2 x per week at home, keep log, and bring to clinic at next appointment. 5. Follow up 1 week.        Relevant Medications   triamterene-hydrochlorothiazide (MAXZIDE-25) 37.5-25 MG tablet   Other Relevant Orders   Lipid Profile     Endocrine   Controlled type 2 diabetes mellitus with hyperglycemia ([address]) - Primary    ControlledDM with A1c 6.9% Worsening control from 6.4% on 08/25/2019 and goal A1c < 7.0%.  Plan:  1. Continue current therapy: metformin 500mg  twice daily with meals 2. Encourage improved lifestyle: - low carb/low glycemic diet reinforced prior education - Increase physical activity to 30 minutes most days of the week.  Explained that increased physical activity increases body's use of sugar for energy. 3. Check fasting am CBG and log.  Bring log to next visit for review 4. DM Foot exam done today with no acute findings.   and Advised to schedule DM ophtho exam, send record. 5. Follow-up 6 months      Relevant Medications   metFORMIN (GLUCOPHAGE) 500 MG tablet   Other Relevant Orders   POCT Urinalysis Dipstick (Completed)   POCT glycosylated hemoglobin (Hb A1C) (Completed)   Microalbumin, urine   Lipid Profile     Nervous and Auditory   Neuropathy due to chemotherapeutic drug (HCC)   Relevant Medications   gabapentin (NEURONTIN) 400 MG capsule     Other  Hypokalemia    Hypokalemia likely related to diuretic in triamterene-hydrochlorothiazide daily usage.  Since blood pressure is presently well controlled with this dosing, will continue.  Will replace potassium with potassium chloride 32mEq twice daily, will repeat labs in 1 week for re-evaluation of potassium levels and will make changes based on results, followed by lab re-evaluation to confirm control, if obtained.      Relevant Medications   potassium chloride (KLOR-CON) 10  MEQ tablet   Other Relevant Orders   COMPLETE METABOLIC PANEL WITH GFR    Other Visit Diagnoses    Hematuria, unspecified type       Relevant Orders   Urinalysis, microscopic only   Adult BMI 40.0-44.9 kg/sq m (HCC)       Relevant Medications   metFORMIN (GLUCOPHAGE) 500 MG tablet   Other Relevant Orders   Thyroid Panel With TSH      Meds ordered this encounter  Medications  . potassium chloride (KLOR-CON) 10 MEQ tablet    Sig: Take 1 tablet (10 mEq total) by mouth 2 (two) times daily for 10 days.    Dispense:  20 tablet    Refill:  0  . gabapentin (NEURONTIN) 400 MG capsule    Sig: Take 1 capsule (400 mg total) by mouth at bedtime.    Dispense:  90 capsule    Refill:  1  . metFORMIN (GLUCOPHAGE) 500 MG tablet    Sig: TAKE 2 TABLETS BY MOUTH ONCE DAILY WITH BREAKFAST AND 1 WITH SUPPER    Dispense:  270 tablet    Refill:  1  . triamterene-hydrochlorothiazide (MAXZIDE-25) 37.5-25 MG tablet    Sig: Take 1 tablet by mouth daily.    Dispense:  90 tablet    Refill:  1      Follow up plan: Return in about 1 week (around 02/24/2020) for Lab & PAP Testing.   Harlin Rain, [place] [facility] Nurse Practitioner [facility] 02/17/2020, 8:58 AM

## 2020-02-17 NOTE — Assessment & Plan Note (Signed)
Slightly elevated hypertension in clinic today.  BP goal < 130/80.  Pt not working on lifestyle modifications.  Taking medications tolerating well without side effects.  Plan: 1. Continue taking triamterene-hydrochlorothiazide 37.5-25mg  tablet daily 2. Obtain labs next week 3. Encouraged heart healthy diet and increasing exercise to 30 minutes most days of the week, going no more than 2 days in a row without exercise. 4. Check BP 1-2 x per week at home, keep log, and bring to clinic at next appointment. 5. Follow up 1 week.

## 2020-02-17 NOTE — Progress Notes (Signed)
Chelsea Hudson

## 2020-02-17 NOTE — Addendum Note (Signed)
Addended by: Wilson Singer on: 02/17/2020 11:38 AM   Modules accepted: Orders

## 2020-02-17 NOTE — Assessment & Plan Note (Signed)
Hypokalemia likely related to diuretic in triamterene-hydrochlorothiazide daily usage.  Since blood pressure is presently well controlled with this dosing, will continue.  Will replace potassium with potassium chloride 69mEq twice daily, will repeat labs in 1 week for re-evaluation of potassium levels and will make changes based on results, followed by lab re-evaluation to confirm control, if obtained.

## 2020-02-17 NOTE — Patient Instructions (Signed)
As we discussed, we will start you on Potassium replacement of 77mEq twice daily and repeat your labs in 1 week.  When you return in 1 week we will plan to complete your PAP testing as well.  Once your labs are drawn next week, I will be in contact with you to discuss lab results and treatment plan.  Try to get exercise a minimum of 30 minutes per day at least 5 days per week as well as  adequate water intake all while measuring blood pressure a few times per week.  Keep a blood pressure log and bring back to clinic at your next visit.  If your readings are consistently over 140/90 to contact our office/send me a MyChart message and we will see you sooner.  Can try DASH and Mediterranean diet options, avoiding processed foods, lowering sodium intake, avoiding pork products, and eating a plant based diet for optimal health.  Advice on Protecting Your Feet   1. Daily foot inspections - Look for any breaks in the skin or areas of irritation such as blisters or red areas. Report to primary doctor or foot nurse immediately if any problems occur.  - If you have vision problems and cannot see your feet well or it is hard for you to reach your feet, ask a family member to inspect your feet daily.   2. Daily foot hygiene  - Wash feet daily, but do not soak your feet in hot water. If you do have callus, you may use warm water epsom salt foot soak and use moisturizer after. - Dry well especially between toes; Pat dry do not rub.  - If your skin is dry use a lotion to moisturize but never between the toes.  - If your skin is wet from perspiration, use an antifungal foot powder daily.   3. Shoes and Socks  - Wear a clean pair of socks daily.  - Make sure your shoes fit well and that you are measured and properly fit each time you purchase shoes. Your shoe size may change.  - Powder your shoes with a small amount of an antifungal foot powder daily, as the shoe is the only article of clothing that is not  laundered.  - Wear appropriate shoes and socks for the weather. It is especially important to protect your feet from the cold; however, don't forget about sunscreen to the tops of your feet in the hot sun.  - Wear well fitting shoes rather than slippers or flip-flops when walking or standing for long period of time.  - When at home make sure you always have protective foot wear on your feet.     Eat at least 3 meals and 1-2 snacks per day (don't skip breakfast).  Aim for no more than 5 hours between eating. - Tip: If you go >5 hours without eating and become very hungry, your body will supply it's own resources temporarily and you can gain extra weight when you eat.   The 5 Minute Rule of Exercise - Promise yourself to at least do 5 minutes of exercise (make sure you time it), and if at the end of 5 minutes (this is the hardest part of the work-out), if you still feel like you want stop (or not motivated to continue) then allow yourself to stop. Otherwise, more often than not you will feel encouraged that you can continue for a little while longer or even more!   My 5 to Fitness!  5: fruits  and vegetables per day (work on 9 per day if you are at 5) 4: exercise 4-5 times per week for at least 30 minutes (walking counts!) 3: meals per day (don't skip breakfast!), no more than 5 hours between meals 2: habits to quit -smoking -excess alcohol use (men >2 beer/day; women >1beer/day) 1: sweet per day (2 cookies, 1 small cup of ice cream, 12 oz soda)  These are general tips for healthy living. Try to start with 1 or 2 habit TODAY and make it a part of your life for several months. You set a goal today to work on:  Once you have 1 or 2 habits down for several months, try to begin working on your next healthy habit. With every single step you take, you will be leading a healthier lifestyle!   Diet Recommendations for Diabetes   Starchy (carb) foods include: Bread, rice, pasta, potatoes, corn,  crackers, bagels, muffins, all baked goods.   Protein foods include: Meat, fish, poultry, eggs, dairy foods, and beans such as pinto and kidney beans (beans also provide carbohydrate).   1. Eat at least 3 meals and 1-2 snacks per day. Never go more than 4-5 hours while awake without eating.   2. Limit starchy foods to TWO per meal and ONE per snack. ONE portion of a starchy  food is equal to the following:   - ONE slice of bread (or its equivalent, such as half of a hamburger bun).   - 1/2 cup of a "scoopable" starchy food such as potatoes or rice.   - 1 OUNCE (28 grams) of starchy snacks (crackers or pretzels, look on label).   - 15 grams of carbohydrate as shown on food label.   3. Both lunch and dinner should include a protein food, a carb food, and vegetables.   - Obtain twice as many veg's as protein or carbohydrate foods for both lunch and dinner.   - Try to keep frozen veg's on hand for a quick vegetable serving.     - Fresh or frozen veg's are best.   4. Breakfast should always include protein.    You can learn more information online about your diabetes at American Diabetes Association: http://www.diabetes.org/ - General self-care (diet, medications, blood sugar checks). - Diet recommendations - There are even recipes available for you to look at and try.  We will plan to see you back in 1 week for labs and PAP testing  You will receive a survey after today's visit either digitally by e-mail or paper by [place] mail. Your experiences and feedback matter to [place].  Please respond so we know how we are doing as we provide care for you.  Call us with any questions/concerns/needs.  It is my goal to be available to you for your health concerns.  Thanks for choosing me to be a partner in your healthcare needs!  Harlin Rain, FNP-C Family Nurse Practitioner [facility] Phone: [phone]

## 2020-02-17 NOTE — Assessment & Plan Note (Signed)
ControlledDM with A1c 6.9% Worsening control from 6.4% on 08/25/2019 and goal A1c < 7.0%.  Plan:  1. Continue current therapy: metformin 500mg  twice daily with meals 2. Encourage improved lifestyle: - low carb/low glycemic diet reinforced prior education - Increase physical activity to 30 minutes most days of the week.  Explained that increased physical activity increases body's use of sugar for energy. 3. Check fasting am CBG and log.  Bring log to next visit for review 4. DM Foot exam done today with no acute findings.   and Advised to schedule DM ophtho exam, send record. 5. Follow-up 6 months

## 2020-02-18 LAB — URINALYSIS, MICROSCOPIC ONLY
Bacteria, UA: NONE SEEN /HPF
Hyaline Cast: NONE SEEN /LPF
RBC / HPF: NONE SEEN /HPF (ref 0–2)

## 2020-02-18 LAB — MICROALBUMIN, URINE: Microalb, Ur: 9.2 mg/dL

## 2020-02-19 LAB — MICROALBUMIN, URINE

## 2020-02-19 LAB — URINALYSIS, MICROSCOPIC ONLY

## 2020-02-23 ENCOUNTER — Ambulatory Visit: Payer: Medicaid Other | Attending: Oncology | Admitting: Physical Therapy

## 2020-02-23 ENCOUNTER — Other Ambulatory Visit: Payer: Self-pay

## 2020-02-23 ENCOUNTER — Encounter: Payer: Self-pay | Admitting: Physical Therapy

## 2020-02-23 DIAGNOSIS — M25611 Stiffness of right shoulder, not elsewhere classified: Secondary | ICD-10-CM | POA: Insufficient documentation

## 2020-02-23 DIAGNOSIS — G8929 Other chronic pain: Secondary | ICD-10-CM | POA: Insufficient documentation

## 2020-02-23 DIAGNOSIS — I972 Postmastectomy lymphedema syndrome: Secondary | ICD-10-CM | POA: Diagnosis present

## 2020-02-23 DIAGNOSIS — M25511 Pain in right shoulder: Secondary | ICD-10-CM | POA: Insufficient documentation

## 2020-02-23 NOTE — Therapy (Signed)
[facility] [address], [place], [address] Phone: [phone]   Fax:  [phone]  Physical Therapy Evaluation  Patient Details  Name: Chelsea Hudson MRN: [id number] Date of Birth: April 23, 1966 No data recorded  Encounter Date: 02/23/2020  PT End of Session - 02/23/20 1440    Visit Number  1    Number of Visits  16    Date for PT Re-Evaluation  04/23/20    PT Start Time  0230    PT Stop Time  0330    PT Time Calculation (min)  60 min    Activity Tolerance  Patient tolerated treatment well    Behavior During Therapy  [facility] for tasks assessed/performed       Past Medical History:  Diagnosis Date  . Arthritis    SHOULDER  . Breast cancer ([place]) 02/2017   rt breast  . Cancer ([place]) 02/28/2017   INVASIVE MAMMARY CARCINOMA WITH MUCINOUS FEATURES.  . Colonic diverticular abscess 06/21/2017   Colonoscopy 06/25/2017: No evidence of malignancy.  . Diabetes mellitus without complication ([place])   . Hypertension   . Irregular heart beat    PT STATES IT "SKIPS A BEAT"   . Obesity   . Personal history of chemotherapy   . Personal history of radiation therapy     Past Surgical History:  Procedure Laterality Date  . ANKLE SURGERY    . BREAST BIOPSY Right 02/28/2017   INVASIVE MAMMARY CARCINOMA WITH MUCINOUS FEATURES.  Marland Kitchen BREAST CYST ASPIRATION Right    NEG  . COLONOSCOPY WITH PROPOFOL N/A 06/25/2017   Procedure: COLONOSCOPY WITH PROPOFOL;  Surgeon: Jonathon Bellows, MD;  Location: [facility] ENDOSCOPY;  Service: Gastroenterology;  Laterality: N/A;  . MASTECTOMY, PARTIAL Right 07/30/2017   Procedure: MASTECTOMY PARTIAL;  Surgeon: Robert Bellow, MD;  Location: [facility] ORS;  Service: General;  Laterality: Right;  . PORTACATH PLACEMENT Left 03/15/2017   Procedure: INSERTION PORT-A-CATH;  Surgeon: Robert Bellow, MD;  Location: [facility] ORS;  Service: General;  Laterality: Left;  . RE-EXCISION OF BREAST LUMPECTOMY Right 08/17/2017     INVASIVE CARCINOMA EXTENDS TO NEW LATERAL MARGIN. /RE-EXCISION OF BREAST LUMPECTOMY;: Byrnett, Forest Gleason, MD;  [facility] ORS; General;  Laterality: Right;  . SENTINEL NODE BIOPSY Right 07/30/2017   Procedure: SENTINEL NODE BIOPSY;  Surgeon: Robert Bellow, MD;  Location: [facility] ORS;  Service: General;  Laterality: Right;  . SIMPLE MASTECTOMY WITH AXILLARY SENTINEL NODE BIOPSY Right 09/28/2017   Procedure: SIMPLE MASTECTOMY;  Surgeon: Robert Bellow, MD;  Location: [facility] ORS;  Service: General;  Laterality: Right;    There were no vitals filed for this visit.   Subjective Assessment - 02/23/20 1427    Pertinent History  Pt is a 54 year old female s/p masectomy w/ lymphectomy in 2019, following chemo and radiation treatment for R breast cancer diagnosis. Has had lymphadema since beginning of 2020, and has seen improvement for this following OT treatment here since Dec 2020. She is being seen by PT today d/t progressive R shoulder weakness and decrease of motion over the past year d/t scar tissue, and lymphadema. 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 She reports difficulty with all overhead motion, lifting (reports she cannot lift a gallon of milk with RUE), and can only drive with hands at lower part of wheel. Reports occasional tension/dull pain in the shoulder/neck pain that is random in nature, but usually comes on at the end  of the day when she has used the UE often during the day. Worst pain over the past week: 7/10 best 0/10. Pt denies N/V, B&B changes, unexplained weight fluctuation, saddle paresthesia, fever, night sweats, or unrelenting night pain at this time    Limitations  Lifting;House hold activities    How long can you sit comfortably?  unlimited    How long can you stand comfortably?  49mins    How long can you walk comfortably?  62mins    Diagnostic tests  None    Patient Stated Goals  To be able to use my RUE like I do my LUE    Currently in Pain?  Yes     Pain Score  0-No pain    Pain Location  Shoulder    Pain Orientation  Right    Pain Descriptors / Indicators  Aching;Dull;Tightness    Pain Type  Chronic pain    Pain Radiating Towards  R side of neck and shoulder, along UT    Pain Onset  More than a month ago    Aggravating Factors   lifting, overhead, driving    Pain Relieving Factors  nothing    Effect of Pain on Daily Activities  difficulty with household ADLs and driving for work         .  OBJECTIVE  MUSCULOSKELETAL: Tremor: Normal Bulk: Normal Tone: Normal  to mo Cervical Screen AROM:  Flex: WNL Ext; WNL Rotation:  WNL bilat Lateral bending: 25d bilat with pulling at contralateral UT Spurlings A (ipsilateral lateral flexion/axial compression): Negative bilat Spurlings B (ipsilateral lateral flexion/contralateral rotation/axial compression): Negative bilat Repeated movement: No centralization or peripheralization with protraction or retraction Hoffman Sign (cervical cord compression): Negative bilat   Elbow Screen Elbow AROM: WNL bilat  Palpation TTP with facial grimace to moderate pressure at and above incision with increased scar tissue. Tension with trigger points and visual discomfort to bilat UT and R pec minor and major  Posture: upper crossed  Strength R/L 4+/5 Shoulder flexion (anterior deltoid/pec major/coracobrachialis, axillary n. (C5-6) and musculocutaneous n. (C5-7)) 4+/5 Shoulder abduction (deltoid/supraspinatus, axillary/suprascapular n, C5) 3+/5 Shoulder external rotation (infraspinatus/teres minor) 5/5 Shoulder internal rotation (subcapularis/lats/pec major) 5/5 Shoulder extension (posterior deltoid, lats, teres major, axillary/thoracodorsal n.) 5/5 Elbow flexion (biceps brachii, brachialis, brachioradialis, musculoskeletal n, C5-6) 5/5 Elbow extension (triceps, radial n, C7) 5/5 Wrist Extension 5/5 Wrist Flexion 5/5 Finger adduction (interossei, ulnar n, T1) 2/4- Y Lower trap 3+/4 T  Scapular Retractors 4/4+ I 4/4+ Latissimus   AROM R/L 110*/180 Shoulder flexion 90/180 Shoulder abduction T8/T8 Shoulder external rotation C5/C8 Shoulder internal rotation 60/60 Shoulder extension *Indicates pain, overpressure performed unless otherwise indicated  PROM R/L 132/180 Shoulder flexion 108/180 Shoulder abduction 64/90 Shoulder external rotation 70/70 Shoulder internal rotation 60/60 Shoulder extension *Indicates pain, overpressure performed unless otherwise indicated  Accessory Motions/Glides Glenohumeral: Posterior: R guarding  Inferior: WNL bilat  Acromioclavicular:  Posterior: WNL bilat Anterior: WNL bilat  Sternoclavicular: Posterior: WNL bilat Anterior: WNL bilat Superior: WNL bilat Inferior: WNL bilat  Muscle Length Testing Pectoralis Major: Abnormal bilat Pectoralis Minor: Abnormal bilat Biceps: Normal bilat  NEUROLOGICAL:  Mental Status Patient is oriented to person, place and time.  Recent memory is intact.  Remote memory is intact.  Attention span and concentration are intact.  Expressive speech is intact.  Patient's fund of knowledge is within normal limits for educational level.  Cranial Nerves Visual acuity and visual fields are intact  Extraocular muscles are intact  Facial sensation is intact  bilaterally  Facial strength is intact bilaterally  Hearing is normal as tested by gross conversation Palate elevates midline, normal phonation  Shoulder shrug strength is intact  Tongue protrudes midline   Sensation Grossly intact to light touch bilateral UE as determined by testing dermatomes C2-T2 Proprioception and hot/cold testing deferred on this date   SPECIAL TESTS  Rotator Cuff  Drop Arm Test:  Negative bilat Painful Arc (Pain from 60 to 120 degrees scaption):  Negative bilat Infraspinatus Muscle Test:  Negative bilat  Subacromial Impingement Hawkins-Kennedy: Negative bilat Neer (Block scapula, PROM flexion): Negative  bilat Painful Arc (Pain from 60 to 120 degrees scaption): Negative bilat Empty Can: Negative bilat External Rotation Resistance: Negative bilat Scapular Assist: Negative bilat  Labral Tear Biceps Load II (120 elevation, full ER, 90 elbow flexion, full supination, resisted elbow flexion): Negative bilat Crank (160 scaption, axial load with IR/ER): Negative bilat Active Compression Test: Negative bilat  Bicep Tendon Pathology Speed (shoulder flexion to 90, external rotation, full elbow extension, and forearm supination with resistance:  Negative bilat Yergason's (resisted shoulder ER and supination/biceps tendon pathology):  Negative bilat  Shoulder Instability Sulcus Sign:  Negative bilat Anterior Apprehension:  Negative bilat  Ther-Ex Educated patient on pain science and desensitization, and this impact on pain sensation. Education on guarding position and relaxed position with good understanding. Patient able to complete a set of the following therex with good demonstrated technique teunderstanding and verbalized understanding of frequency, purpose, rep/set range.  Access Code: [email or web address]  Seated Shoulder Flexion AAROM with Pulley Behind - 2 x daily - 7 x weekly - 15 reps - 3sec hold  Seated Shoulder Abduction AAROM with Pulley Behind - 2 x daily - 7 x weekly - 15 reps - 3sec hold  Doorway Pec Stretch at 60 Degrees Abduction with Arm Straight - 3 x daily - 7 x weekly - 30sec hold  Seated Scapular Retraction - 8 x daily - 7 x weekly - 10 reps - 3sec hold Scar massage 44mins/day              Objective measurements completed on examination: See above findings.              PT Education - 02/23/20 1439    Education Details  Patient was educated on diagnosis, anatomy and pathology involved, prognosis, role of PT, and was given an HEP, demonstrating exercise with proper form following verbal and tactile cues, and was given a paper hand out to continue exercise at  home. Pt was educated on and agreed to plan of care.    Person(s) Educated  Patient    Methods  Explanation;Demonstration;Tactile cues;Verbal cues;Handout    Comprehension  Verbalized understanding;Returned demonstration;Verbal cues required;Tactile cues required       PT Short Term Goals - 02/23/20 1521      PT SHORT TERM GOAL #1   Title  Pt will be independent with HEP in order to improve strength and decrease pain in order to improve pain-free function at home and work.    Baseline  02/23/20 HEP given    Time  5    Period  Weeks    Status  New      PT SHORT TERM GOAL #2   Title  Patient will demonstrate full passive shoulder ROM in order to build full active motion needed for overhead tasks    Baseline  02/23/20 flex: 132d abd 102d    Time  5    Period  Weeks  Status  New        PT Long Term Goals - 02/23/20 1522      PT LONG TERM GOAL #1   Title  Patient will increase FOTO score to 59 to demonstrate predicted increase in functional mobility to complete ADLs    Baseline  02/23/20 38    Time  8    Period  Weeks    Status  New      PT LONG TERM GOAL #2   Title  Pt will increase gross periscapular strength to at least 4+/5 MMT grade in order to perform overhead household tasks    Baseline  02/23/20 R/L Lat 4/4+  Y 2/4 T 3+/4 I 4/4+    Time  8    Period  Weeks    Status  New      PT LONG TERM GOAL #3   Title  Patient will demonstrate full active R shoulder abd and flex in order to complete overhead tasks    Baseline  02/23/20 flex: 110d abd 90d    Time  8    Period  Weeks    Status  New      PT LONG TERM GOAL #4   Title  Patient will be able to lift 8# DB onto a shelf at eye level to demonstrate ability to lift a gallon of milk to top shelf of a fridge    Baseline  02/23/20    Time  8    Period  Weeks    Status  New             Plan - 02/23/20 1534    Clinical Impression Statement  Pt is a 54 year old female presenting with shoulder ROM restriction and weakness  following treatment for R breast cancer (radiation and chemotherapy followed by masectomy with lymphectomy) in 2019. Patient with impairments in increased scar tissue, decreased shoulder passive and active ROM, decreased shoulder and periscapular strength, postural dysfunction, and pain. Activity limitations in lifting, carrying, overhead reaching, and driving; inhibiting full participation in household ADLs and parttime work as a Conservation officer, historic buildings.    Personal Factors and Comorbidities  Age;Comorbidity 1;Comorbidity 2;Fitness;Sex;Profession    Comorbidities  HTN, breast cancer    Examination-Participation Restrictions  Laundry;Meal Prep;Yard Work;Community Activity;Driving    Stability/Clinical Decision Making  Evolving/Moderate complexity    Clinical Decision Making  Moderate    Rehab Potential  Good    PT Frequency  2x / week    PT Duration  8 weeks    PT Treatment/Interventions  ADLs/Self Care Home Management;Electrical Stimulation;Therapeutic activities;Patient/family education;Taping;Joint Manipulations;Spinal Manipulations;Passive range of motion;Dry needling;Manual techniques;Functional mobility training;Ultrasound;Cryotherapy;Traction;Neuromuscular re-education;Therapeutic exercise;DME Instruction;Moist Heat;Iontophoresis 4mg /ml Dexamethasone    PT Next Visit Plan  scar tissue massage    PT Home Exercise Plan  [email or web address]    Consulted and Agree with Plan of Care  Patient       Patient will benefit from skilled therapeutic intervention in order to improve the following deficits and impairments:  Increased fascial restricitons, Pain, Improper body mechanics, Decreased mobility, Decreased scar mobility, Impaired tone, Postural dysfunction, Decreased activity tolerance, Decreased endurance, Decreased range of motion, Decreased strength, Impaired UE functional use, Impaired flexibility, Increased edema  Visit Diagnosis: Stiffness of right shoulder, not elsewhere classified  Chronic right shoulder  pain     Problem List Patient Active Problem List   Diagnosis Date Noted  . Hypokalemia 02/12/2020  . 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 Pyuria  11/07/2018  . Urinary urgency 11/07/2018  . Hyperlipidemia associated with type 2 diabetes mellitus ([place]) 10/02/2018  . Neuropathy due to chemotherapeutic drug ([place]) 10/02/2018  . Myalgia due to statin 10/02/2018  . Controlled type 2 diabetes mellitus with hyperglycemia ([place]) 07/05/2018  . Diverticulosis of large intestine without diverticulitis 03/01/2018  . Personal history of chemotherapy 01/03/2018  . Malignant neoplasm of upper-outer quadrant of right breast in female, estrogen receptor positive ([place]) 03/05/2017  . Goals of care, counseling/discussion 03/05/2017  . Essential hypertension 01/05/2016   Shelton Silvas PT, DPT Shelton Silvas 02/23/2020, 3:54 PM  [facility] [address], [place], [address] Phone: [phone]   Fax:  [phone]  Name: Chelsea Hudson MRN: [id number] Date of Birth: 13-Aug-1966

## 2020-02-24 ENCOUNTER — Ambulatory Visit: Payer: Medicaid Other | Admitting: Family Medicine

## 2020-02-26 ENCOUNTER — Encounter: Payer: Disability Insurance | Admitting: Occupational Therapy

## 2020-02-26 DIAGNOSIS — R200 Anesthesia of skin: Secondary | ICD-10-CM | POA: Insufficient documentation

## 2020-02-26 DIAGNOSIS — R202 Paresthesia of skin: Secondary | ICD-10-CM | POA: Insufficient documentation

## 2020-02-27 ENCOUNTER — Encounter: Payer: Self-pay | Admitting: Family Medicine

## 2020-02-27 ENCOUNTER — Other Ambulatory Visit: Payer: Self-pay

## 2020-02-27 ENCOUNTER — Ambulatory Visit (INDEPENDENT_AMBULATORY_CARE_PROVIDER_SITE_OTHER): Payer: Medicaid Other | Admitting: Family Medicine

## 2020-02-27 ENCOUNTER — Other Ambulatory Visit (HOSPITAL_COMMUNITY)
Admission: RE | Admit: 2020-02-27 | Discharge: 2020-02-27 | Disposition: A | Discharge status: Home / Self Care | Payer: Medicaid Other | Source: Ambulatory Visit | Attending: Family Medicine | Admitting: Family Medicine

## 2020-02-27 VITALS — BP 144/72 | Temp 97.1°F | Ht 66.0 in | Wt 246.0 lb

## 2020-02-27 DIAGNOSIS — E1169 Type 2 diabetes mellitus with other specified complication: Secondary | ICD-10-CM

## 2020-02-27 DIAGNOSIS — E785 Hyperlipidemia, unspecified: Secondary | ICD-10-CM

## 2020-02-27 DIAGNOSIS — R3129 Other microscopic hematuria: Secondary | ICD-10-CM | POA: Diagnosis not present

## 2020-02-27 DIAGNOSIS — I10 Essential (primary) hypertension: Secondary | ICD-10-CM

## 2020-02-27 DIAGNOSIS — Z01419 Encounter for gynecological examination (general) (routine) without abnormal findings: Secondary | ICD-10-CM | POA: Insufficient documentation

## 2020-02-27 DIAGNOSIS — E1165 Type 2 diabetes mellitus with hyperglycemia: Secondary | ICD-10-CM | POA: Diagnosis not present

## 2020-02-27 DIAGNOSIS — E876 Hypokalemia: Secondary | ICD-10-CM

## 2020-02-27 LAB — POCT URINALYSIS DIPSTICK
Bilirubin, UA: NEGATIVE
Blood, UA: NEGATIVE
Glucose, UA: NEGATIVE
Ketones, UA: NEGATIVE
Leukocytes, UA: NEGATIVE
Nitrite, UA: NEGATIVE
Protein, UA: NEGATIVE
Spec Grav, UA: 1.015 (ref 1.010–1.025)
Urobilinogen, UA: 0.2 E.U./dL
pH, UA: 5 (ref 5.0–8.0)

## 2020-02-27 MED ORDER — LISINOPRIL 10 MG PO TABS: 10.0000 mg | ORAL_TABLET | Freq: Every day | ORAL | 3 refills | Status: DC

## 2020-02-27 MED ORDER — ATORVASTATIN CALCIUM 10 MG PO TABS: ORAL_TABLET | ORAL | 3 refills | Status: DC

## 2020-02-27 NOTE — Progress Notes (Signed)
Subjective:    Patient ID: Chelsea Hudson, female    DOB: 01-18-1966, 54 y.o.   MRN: [id number]  Chelsea Hudson is a 54 y.o. female presenting on 02/27/2020 for Gynecologic Exam   HPI  Chelsea Hudson presents to clinic for her well woman PAP testing, follow up hypertension and lab draw appointment.  Denies any acute concerns.  Depression screen [facility] 2/9 12/03/2019 09/26/2018 05/24/2018  Decreased Interest 0 0 0  Down, Depressed, Hopeless 0 0 0  PHQ - 2 Score 0 0 0  Altered sleeping - - -  Tired, decreased energy - - -  Change in appetite - - -  Feeling bad or failure about yourself  - - -  Trouble concentrating - - -  Moving slowly or fidgety/restless - - -  Suicidal thoughts - - -  PHQ-9 Score - - -    Social History   Tobacco Use  . Smoking status: Former Smoker    Packs/day: 0.25    Years: 25.00    Pack years: 6.25    Types: Cigarettes    Quit date: 10/20/2017    Years since quitting: 2.3  . Smokeless tobacco: Former Network engineer Use Topics  . Alcohol use: Yes    Comment: occas  . Drug use: No    Review of Systems  Constitutional: Negative.   HENT: Negative.   Eyes: Negative.   Respiratory: Negative.   Cardiovascular: Negative.   Gastrointestinal: Negative.   Endocrine: Negative.   Genitourinary: Negative.   Musculoskeletal: Negative.   Skin: Negative.   Allergic/Immunologic: Negative.   Neurological: Negative.   Hematological: Negative.   Psychiatric/Behavioral: Negative.    Per HPI unless specifically indicated above     Objective:    BP (!) 144/72   Temp (!) 97.1 F (36.2 C) (Temporal)   Ht 5\' 6"  (1.676 m)   Wt 246 lb (111.6 kg)   LMP 01/31/2014 (Approximate) Comment: LMP was 3 years ago.  BMI 39.71 kg/m   Wt Readings from Last 3 Encounters:  02/27/20 246 lb (111.6 kg)  02/17/20 249 lb 9.6 oz (113.2 kg)  02/12/20 243 lb 2.7 oz (110.3 kg)    Physical Exam Vitals reviewed.  Constitutional:      General: She is not in acute distress.  Appearance: Normal appearance. She is well-developed and well-groomed. She is obese. She is not ill-appearing or toxic-appearing.  HENT:     Head: Normocephalic.  Eyes:     General: Lids are normal. Vision grossly intact.        Right eye: No discharge.        Left eye: No discharge.     Extraocular Movements: Extraocular movements intact.     Conjunctiva/sclera: Conjunctivae normal.     Pupils: Pupils are equal, round, and reactive to light.  Cardiovascular:     Rate and Rhythm: Normal rate and regular rhythm.     Pulses: Normal pulses.          Dorsalis pedis pulses are 2+ on the right side and 2+ on the left side.       Posterior tibial pulses are 2+ on the right side and 2+ on the left side.  Pulmonary:     Effort: Pulmonary effort is normal.  Abdominal:     General: Abdomen is flat. There is no distension.     Palpations: Abdomen is soft.     Tenderness: There is no abdominal tenderness. There is no guarding.  Hernia: There is no hernia in the left inguinal area or right inguinal area.  Genitourinary:    General: Normal vulva.     Exam position: Lithotomy position.     Pubic Area: No rash or pubic lice.      Labia:        Right: No rash, tenderness, lesion or injury.        Left: No rash, tenderness, lesion or injury.      Urethra: No urethral pain, urethral swelling or urethral lesion.     Vagina: Normal. No signs of injury and foreign body. No vaginal discharge, erythema, tenderness, bleeding, lesions or prolapsed vaginal walls.     Cervix: No cervical motion tenderness, discharge, friability, lesion, erythema, cervical bleeding or eversion.     Uterus: Normal. Not enlarged and not tender.      Adnexa: Right adnexa normal and left adnexa normal.  Musculoskeletal:     Right lower leg: No edema.     Left lower leg: No edema.  Feet:     Right foot:     Skin integrity: Skin integrity normal.     Left foot:     Skin integrity: Skin integrity normal.  Lymphadenopathy:      Lower Body: No right inguinal adenopathy. No left inguinal adenopathy.  Skin:    General: Skin is warm and dry.     Capillary Refill: Capillary refill takes less than 2 seconds.  Neurological:     General: No focal deficit present.     Mental Status: She is alert and oriented to person, place, and time.     Cranial Nerves: No cranial nerve deficit.     Sensory: No sensory deficit.     Motor: No weakness.     Coordination: Coordination normal.     Gait: Gait normal.  Psychiatric:        Attention and Perception: Attention and perception normal.        Mood and Affect: Mood and affect normal.        Speech: Speech normal.        Behavior: Behavior normal. Behavior is cooperative.        Thought Content: Thought content normal.        Cognition and Memory: Cognition and memory normal.        Judgment: Judgment normal.     Results for orders placed or performed in visit on 02/27/20  POCT Urinalysis Dipstick  Result Value Ref Range   Color, UA Yellow    Clarity, UA clear    Glucose, UA Negative Negative   Bilirubin, UA negative    Ketones, UA negative    Spec Grav, UA 1.015 1.010 - 1.025   Blood, UA negative    pH, UA 5.0 5.0 - 8.0   Protein, UA Negative Negative   Urobilinogen, UA 0.2 0.2 or 1.0 E.U./dL   Nitrite, UA negative    Leukocytes, UA Negative Negative   Appearance     Odor        Assessment & Plan:   Problem List Items Addressed This Visit      Cardiovascular and Mediastinum   Essential hypertension - Primary    Uncontrolled hypertension.  BP is not at goal < 130/80.  Pt is working on lifestyle modifications.  Taking medications tolerating well without side effects.   Plan: 1. Continue taking Triamterene-hydrochlorothiazide 37.5-25 daily and BEGIN Lisinopril 10mg  daily. 2. Obtain labs today  3. Encouraged heart healthy diet and increasing exercise  to 30 minutes most days of the week, going no more than 2 days in a row without exercise. 4. Check BP 1-2 x  per week at home, keep log, and bring to clinic at next appointment. 5. Follow up 3 months.      Relevant Medications   atorvastatin (LIPITOR) 10 MG tablet   lisinopril (ZESTRIL) 10 MG tablet   Other Relevant Orders   COMPLETE METABOLIC PANEL WITH GFR   CBC with Differential     Endocrine   Controlled type 2 diabetes mellitus with hyperglycemia (HCC)   Relevant Medications   atorvastatin (LIPITOR) 10 MG tablet   lisinopril (ZESTRIL) 10 MG tablet   Other Relevant Orders   COMPLETE METABOLIC PANEL WITH GFR   CBC with Differential   Hyperlipidemia associated with type 2 diabetes mellitus ([place])    Status unknown.  Recheck labs.  Previously taking atorvastatin with abrupt stopping of medication.  Will have labs drawn today and will restart on low dose of Atorvastatin10mg  daily has T2DM with previous hyperlipidemia.  Plan:  1. Resume Atorvastatin 10mg  Mondays, Wednesdays and Friday 2. Have labs drawn today and will contact with the results 3. Repeat lipid levels in 3 months 4. Follow up in 3 months      Relevant Medications   atorvastatin (LIPITOR) 10 MG tablet   lisinopril (ZESTRIL) 10 MG tablet   Other Relevant Orders   COMPLETE METABOLIC PANEL WITH GFR   CBC with Differential     Other   Hypokalemia    Status unknown.  Recheck labs.  Continue meds without changes today and will contact to discuss once labs are resulted to discuss continued treatment with Potassium Chloride 63mEq BID daily.       Relevant Orders   COMPLETE METABOLIC PANEL WITH GFR   CBC with Differential   Well woman exam with routine gynecological exam    GYN exam completed today, PAP testing done and sent to the lab for evaluation.  Bimanual exam WNL.  Plan: 1. PAP sent to the lab for testing.  Will follow up once resulted.      Relevant Orders   Cytology - PAP    Other Visit Diagnoses    Microscopic hematuria       Relevant Orders   POCT Urinalysis Dipstick (Completed)      Meds ordered  this encounter  Medications  . atorvastatin (LIPITOR) 10 MG tablet    Sig: Take 1 tablet Monday, Wednesday and Friday    Dispense:  36 tablet    Refill:  3  . lisinopril (ZESTRIL) 10 MG tablet    Sig: Take 1 tablet (10 mg total) by mouth daily.    Dispense:  90 tablet    Refill:  3      Follow up plan: Return in about 3 months (around 05/28/2020) for HTN F/U.   Harlin Rain, [place] [facility] Nurse Practitioner [facility] 02/27/2020, 8:17 AM

## 2020-02-27 NOTE — Assessment & Plan Note (Signed)
GYN exam completed today, PAP testing done and sent to the lab for evaluation.  Bimanual exam WNL.  Plan: 1. PAP sent to the lab for testing.  Will follow up once resulted.

## 2020-02-27 NOTE — Assessment & Plan Note (Signed)
Uncontrolled hypertension.  BP is not at goal < 130/80.  Pt is working on lifestyle modifications.  Taking medications tolerating well without side effects.   Plan: 1. Continue taking Triamterene-hydrochlorothiazide 37.5-25 daily and BEGIN Lisinopril 10mg  daily. 2. Obtain labs today  3. Encouraged heart healthy diet and increasing exercise to 30 minutes most days of the week, going no more than 2 days in a row without exercise. 4. Check BP 1-2 x per week at home, keep log, and bring to clinic at next appointment. 5. Follow up 3 months.

## 2020-02-27 NOTE — Assessment & Plan Note (Signed)
Status unknown.  Recheck labs.  Continue meds without changes today and will contact to discuss once labs are resulted to discuss continued treatment with Potassium Chloride 27mEq BID daily.

## 2020-02-27 NOTE — Patient Instructions (Signed)
We have sent your PAP to the lab for testing.  Will contact you once the results are received.  As we discussed, have your labs drawn in the next 1-2 weeks and we will contact you with the results.  I have restarted your Atorvastatin at 10mg  to take on Mondays, Wednesdays and Fridays.  We can plan to repeat your cholesterol labs in 3-6 months.  I have also put you onto Lisinopril 10mg  to take 1 tablet daily to help with lowering your blood pressure but also for help with kidney protection.  Try to get exercise a minimum of 30 minutes per day at least 5 days per week as well as  adequate water intake all while measuring blood pressure a few times per week.  Keep a blood pressure log and bring back to clinic at your next visit.  If your readings are consistently over 140/90 to contact our office/send me a MyChart message and we will see you sooner.  Can try DASH and Mediterranean diet options, avoiding processed foods, lowering sodium intake, avoiding pork products, and eating a plant based diet for optimal health.  We will plan to see you back in 3 months for hypertension follow up  You will receive a survey after today's visit either digitally by e-mail or paper by [place] mail. Your experiences and feedback matter to [place].  Please respond so we know how we are doing as we provide care for you.  Call us with any questions/concerns/needs.  It is my goal to be available to you for your health concerns.  Thanks for choosing me to be a partner in your healthcare needs!  Harlin Rain, FNP-C Family Nurse Practitioner [facility] Phone: [phone]

## 2020-02-27 NOTE — Assessment & Plan Note (Signed)
Status unknown.  Recheck labs.  Previously taking atorvastatin with abrupt stopping of medication.  Will have labs drawn today and will restart on low dose of Atorvastatin10mg  daily has T2DM with previous hyperlipidemia.  Plan:  1. Resume Atorvastatin 10mg  Mondays, Wednesdays and Friday 2. Have labs drawn today and will contact with the results 3. Repeat lipid levels in 3 months 4. Follow up in 3 months

## 2020-02-28 LAB — CBC WITH DIFFERENTIAL/PLATELET
Absolute Monocytes: 442 cells/uL (ref 200–950)
Basophils Absolute: 34 cells/uL (ref 0–200)
Basophils Relative: 0.4 %
Eosinophils Absolute: 128 cells/uL (ref 15–500)
Eosinophils Relative: 1.5 %
HCT: 38.1 % (ref 35.0–45.0)
Hemoglobin: 11.7 g/dL (ref 11.7–15.5)
Lymphs Abs: 1496 cells/uL (ref 850–3900)
MCH: 25.7 pg — ABNORMAL LOW (ref 27.0–33.0)
MCHC: 30.7 g/dL — ABNORMAL LOW (ref 32.0–36.0)
MCV: 83.6 fL (ref 80.0–100.0)
MPV: 8.9 fL (ref 7.5–12.5)
Monocytes Relative: 5.2 %
Neutro Abs: 6401 cells/uL (ref 1500–7800)
Neutrophils Relative %: 75.3 %
Platelets: 354 10*3/uL (ref 140–400)
RBC: 4.56 10*6/uL (ref 3.80–5.10)
RDW: 16.9 % — ABNORMAL HIGH (ref 11.0–15.0)
Total Lymphocyte: 17.6 %
WBC: 8.5 10*3/uL (ref 3.8–10.8)

## 2020-02-28 LAB — COMPLETE METABOLIC PANEL WITH GFR
AG Ratio: 1.3 (calc) (ref 1.0–2.5)
ALT: 20 U/L (ref 6–29)
AST: 15 U/L (ref 10–35)
Albumin: 3.8 g/dL (ref 3.6–5.1)
Alkaline phosphatase (APISO): 107 U/L (ref 37–153)
BUN: 19 mg/dL (ref 7–25)
CO2: 24 mmol/L (ref 20–32)
Calcium: 9.5 mg/dL (ref 8.6–10.4)
Chloride: 104 mmol/L (ref 98–110)
Creat: 1.05 mg/dL (ref 0.50–1.05)
GFR, Est African American: 71 mL/min/{1.73_m2} (ref >=60)
GFR, Est Non African American: 61 mL/min/{1.73_m2} (ref >=60)
Globulin: 3 g/dL (calc) (ref 1.9–3.7)
Glucose, Bld: 156 mg/dL — ABNORMAL HIGH (ref 65–99)
Potassium: 3.9 mmol/L (ref 3.5–5.3)
Sodium: 138 mmol/L (ref 135–146)
Total Bilirubin: 0.2 mg/dL (ref 0.2–1.2)
Total Protein: 6.8 g/dL (ref 6.1–8.1)

## 2020-02-28 LAB — LIPID PANEL
Cholesterol: 155 mg/dL (ref <200)
HDL: 38 mg/dL — ABNORMAL LOW (ref >=50)
LDL Cholesterol (Calc): 96 mg/dL (calc)
Non-HDL Cholesterol (Calc): 117 mg/dL (calc) (ref <130)
Total CHOL/HDL Ratio: 4.1 (calc) (ref <5.0)
Triglycerides: 117 mg/dL (ref <150)

## 2020-02-28 LAB — THYROID PANEL WITH TSH
Free Thyroxine Index: 3.3 (ref 1.4–3.8)
T3 Uptake: 31 % (ref 22–35)
T4, Total: 10.6 ug/dL (ref 5.1–11.9)
TSH: 2.4 mIU/L

## 2020-03-01 ENCOUNTER — Ambulatory Visit: Payer: Medicaid Other | Admitting: Occupational Therapy

## 2020-03-01 ENCOUNTER — Other Ambulatory Visit: Payer: Self-pay | Admitting: Family Medicine

## 2020-03-01 ENCOUNTER — Ambulatory Visit: Payer: Medicaid Other | Admitting: Physical Therapy

## 2020-03-01 DIAGNOSIS — E876 Hypokalemia: Secondary | ICD-10-CM

## 2020-03-01 MED ORDER — POTASSIUM CHLORIDE CRYS ER 10 MEQ PO TBCR: 10.0000 meq | EXTENDED_RELEASE_TABLET | Freq: Two times a day (BID) | ORAL | 0 refills | Status: DC

## 2020-03-01 NOTE — Progress Notes (Signed)
Labs look great.  Potassium has returned to a normal range with 94mEq twice daily.  Will continue and repeat at the end of this week. I put in the CMP order. Thanks

## 2020-03-02 ENCOUNTER — Ambulatory Visit: Payer: Medicaid Other | Admitting: Physical Therapy

## 2020-03-02 ENCOUNTER — Encounter: Payer: Self-pay | Admitting: Physical Therapy

## 2020-03-02 ENCOUNTER — Other Ambulatory Visit: Payer: Self-pay

## 2020-03-02 DIAGNOSIS — M25611 Stiffness of right shoulder, not elsewhere classified: Secondary | ICD-10-CM | POA: Diagnosis not present

## 2020-03-02 DIAGNOSIS — G8929 Other chronic pain: Secondary | ICD-10-CM

## 2020-03-02 NOTE — Therapy (Signed)
[facility] [address], [place], [address] Phone: [phone]   Fax:  [phone]  Physical Therapy Treatment  Patient Details  Name: Chelsea Hudson MRN: [id number] Date of Birth: Mar 06, 1966 No data recorded  Encounter Date: 03/02/2020  PT End of Session - 03/02/20 1810    Visit Number  2    Number of Visits  16    Date for PT Re-Evaluation  04/23/20    PT Start Time  0545    PT Stop Time  0625    PT Time Calculation (min)  40 min    Activity Tolerance  Patient tolerated treatment well    Behavior During Therapy  [facility] for tasks assessed/performed       Past Medical History:  Diagnosis Date  . Arthritis    SHOULDER  . Breast cancer ([place]) 02/2017   rt breast  . Cancer ([place]) 02/28/2017   INVASIVE MAMMARY CARCINOMA WITH MUCINOUS FEATURES.  . Colonic diverticular abscess 06/21/2017   Colonoscopy 06/25/2017: No evidence of malignancy.  . Diabetes mellitus without complication ([place])   . Hypertension   . Irregular heart beat    PT STATES IT "SKIPS A BEAT"   . Obesity   . Personal history of chemotherapy   . Personal history of radiation therapy     Past Surgical History:  Procedure Laterality Date  . ANKLE SURGERY    . BREAST BIOPSY Right 02/28/2017   INVASIVE MAMMARY CARCINOMA WITH MUCINOUS FEATURES.  Marland Kitchen BREAST CYST ASPIRATION Right    NEG  . COLONOSCOPY WITH PROPOFOL N/A 06/25/2017   Procedure: COLONOSCOPY WITH PROPOFOL;  Surgeon: Jonathon Bellows, MD;  Location: [facility] ENDOSCOPY;  Service: Gastroenterology;  Laterality: N/A;  . MASTECTOMY, PARTIAL Right 07/30/2017   Procedure: MASTECTOMY PARTIAL;  Surgeon: Robert Bellow, MD;  Location: [facility] ORS;  Service: General;  Laterality: Right;  . PORTACATH PLACEMENT Left 03/15/2017   Procedure: INSERTION PORT-A-CATH;  Surgeon: Robert Bellow, MD;  Location: [facility] ORS;  Service: General;  Laterality: Left;  . RE-EXCISION OF BREAST LUMPECTOMY Right 08/17/2017     INVASIVE CARCINOMA EXTENDS TO NEW LATERAL MARGIN. /RE-EXCISION OF BREAST LUMPECTOMY;: Byrnett, Forest Gleason, MD;  [facility] ORS; General;  Laterality: Right;  . SENTINEL NODE BIOPSY Right 07/30/2017   Procedure: SENTINEL NODE BIOPSY;  Surgeon: Robert Bellow, MD;  Location: [facility] ORS;  Service: General;  Laterality: Right;  . SIMPLE MASTECTOMY WITH AXILLARY SENTINEL NODE BIOPSY Right 09/28/2017   Procedure: SIMPLE MASTECTOMY;  Surgeon: Robert Bellow, MD;  Location: [facility] ORS;  Service: General;  Laterality: Right;    There were no vitals filed for this visit.  Subjective Assessment - 03/02/20 1746    Subjective  Patient reports some stiffness today, but no pain. Patient reports compliance with HEP.    Pertinent History  Pt is a 54 year old female s/p masectomy w/ lymphectomy in 2019, following chemo and radiation treatment for R breast cancer diagnosis. Has had lymphadema since beginning of 2020, and has seen improvement for this following OT treatment here since Dec 2020. She is being seen by PT today d/t progressive R shoulder weakness and decrease of motion over the past year d/t scar tissue, and lymphadema. 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 She reports difficulty with all overhead motion, lifting (reports she cannot lift a gallon of milk with RUE), and can only drive with hands at lower part of wheel. Reports occasional tension/dull  pain in the shoulder/neck pain that is random in nature, but usually comes on at the end of the day when she has used the UE often during the day. Worst pain over the past week: 7/10 best 0/10. Pt denies N/V, B&B changes, unexplained weight fluctuation, saddle paresthesia, fever, night sweats, or unrelenting night pain at this time    Limitations  Lifting;House hold activities    How long can you sit comfortably?  unlimited    How long can you stand comfortably?  68mins    How long can you walk comfortably?  58mins    Diagnostic tests   None    Patient Stated Goals  To be able to use my RUE like I do my LUE    Pain Onset  More than a month ago        Ther-Ex Pulleys flex 51mins; abd 36mins with 5sec holds at each max motion  High rows 10# 3x 10/9/8 with good carry over of cuing for scapular retraction  Scaption 1# 3x 8/7/6 with cuing initially for scapular retraction  Standing ER GTB 3x 10 with cuing to prevent trunk rotation with good carry over  Manual Scar massage x65mins; increased time spent on lateral part of scar at axilla Mobilization with movement into flex 58min, abd 65min and ER 23min  AROM following Flex 160d abd 167d ER C7 IR T8                     PT Education - 03/02/20 1809    Education Details  Therex technique/form    Person(s) Educated  Patient    Methods  Explanation;Demonstration;Verbal cues;Tactile cues    Comprehension  Verbalized understanding;Returned demonstration;Verbal cues required;Tactile cues required       PT Short Term Goals - 02/23/20 1521      PT SHORT TERM GOAL #1   Title  Pt will be independent with HEP in order to improve strength and decrease pain in order to improve pain-free function at home and work.    Baseline  02/23/20 HEP given    Time  5    Period  Weeks    Status  New      PT SHORT TERM GOAL #2   Title  Patient will demonstrate full passive shoulder ROM in order to build full active motion needed for overhead tasks    Baseline  02/23/20 flex: 132d abd 102d    Time  5    Period  Weeks    Status  New        PT Long Term Goals - 02/23/20 1522      PT LONG TERM GOAL #1   Title  Patient will increase FOTO score to 59 to demonstrate predicted increase in functional mobility to complete ADLs    Baseline  02/23/20 38    Time  8    Period  Weeks    Status  New      PT LONG TERM GOAL #2   Title  Pt will increase gross periscapular strength to at least 4+/5 MMT grade in order to perform overhead household tasks    Baseline  02/23/20 R/L Lat 4/4+   Y 2/4 T 3+/4 I 4/4+    Time  8    Period  Weeks    Status  New      PT LONG TERM GOAL #3   Title  Patient will demonstrate full active R shoulder abd and flex in order to complete  overhead tasks    Baseline  02/23/20 flex: 110d abd 90d    Time  8    Period  Weeks    Status  New      PT LONG TERM GOAL #4   Title  Patient will be able to lift 8# DB onto a shelf at eye level to demonstrate ability to lift a gallon of milk to top shelf of a fridge    Baseline  02/23/20    Time  8    Period  Weeks    Status  New            Plan - 03/02/20 1828    Clinical Impression Statement  PT utilized manual techniques to increase ROM with good success, with pt very pleased. PT initiated therex for increased scapulo-humeral rhythm and strengthening, with pt able to comply with all cuing for proper technique of therex. PT will continue progression as able.    Personal Factors and Comorbidities  Age;Comorbidity 1;Comorbidity 2;Fitness;Sex;Profession    Comorbidities  HTN, breast cancer    Examination-Participation Restrictions  Laundry;Meal Prep;Yard Work;Community Activity;Driving    Stability/Clinical Decision Making  Evolving/Moderate complexity    Clinical Decision Making  Moderate    Rehab Potential  Good    PT Frequency  2x / week    PT Duration  8 weeks    PT Treatment/Interventions  ADLs/Self Care Home Management;Electrical Stimulation;Therapeutic activities;Patient/family education;Taping;Joint Manipulations;Spinal Manipulations;Passive range of motion;Dry needling;Manual techniques;Functional mobility training;Ultrasound;Cryotherapy;Traction;Neuromuscular re-education;Therapeutic exercise;DME Instruction;Moist Heat;Iontophoresis 4mg /ml Dexamethasone    PT Next Visit Plan  scar tissue massage    PT Home Exercise Plan  [id number]    Consulted and Agree with Plan of Care  Patient       Patient will benefit from skilled therapeutic intervention in order to improve the following deficits and  impairments:  Increased fascial restricitons, Pain, Improper body mechanics, Decreased mobility, Decreased scar mobility, Impaired tone, Postural dysfunction, Decreased activity tolerance, Decreased endurance, Decreased range of motion, Decreased strength, Impaired UE functional use, Impaired flexibility, Increased edema  Visit Diagnosis: Chronic right shoulder pain  Stiffness of right shoulder, not elsewhere classified     Problem List Patient Active Problem List   Diagnosis Date Noted  . Well woman exam with routine gynecological exam 02/27/2020  . Hypokalemia 02/12/2020  . 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 Pyuria 11/07/2018  . Urinary urgency 11/07/2018  . Hyperlipidemia associated with type 2 diabetes mellitus ([place]) 10/02/2018  . 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 Myalgia due to statin 10/02/2018  . Controlled type 2 diabetes mellitus with hyperglycemia ([place]) 07/05/2018  . Diverticulosis of large intestine without diverticulitis 03/01/2018  . Personal history of chemotherapy 01/03/2018  . Malignant neoplasm of upper-outer quadrant of right breast in female, estrogen receptor positive ([place]) 03/05/2017  . Goals of care, counseling/discussion 03/05/2017  . Essential hypertension 01/05/2016   Shelton Silvas PT, DPT Shelton Silvas 03/02/2020, 6:30 PM  [facility] [address] [place], [address] Phone: [phone]   Fax:  [phone]  Name: Chelsea Hudson MRN: [id number] Date of Birth: 1966-09-18

## 2020-03-03 ENCOUNTER — Other Ambulatory Visit: Payer: Self-pay | Admitting: Family Medicine

## 2020-03-03 ENCOUNTER — Encounter: Payer: Disability Insurance | Admitting: Physical Therapy

## 2020-03-03 DIAGNOSIS — R87810 Cervical high risk human papillomavirus (HPV) DNA test positive: Secondary | ICD-10-CM

## 2020-03-03 LAB — CYTOLOGY - PAP
Comment: NEGATIVE
Diagnosis: NEGATIVE
High risk HPV: POSITIVE — AB

## 2020-03-03 NOTE — Progress Notes (Signed)
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  I am putting in a referral to GYN for follow up.

## 2020-03-04 ENCOUNTER — Ambulatory Visit: Payer: Medicaid Other | Admitting: Physical Therapy

## 2020-03-04 ENCOUNTER — Other Ambulatory Visit: Payer: Self-pay

## 2020-03-04 ENCOUNTER — Encounter: Payer: Self-pay | Admitting: Physical Therapy

## 2020-03-04 DIAGNOSIS — G8929 Other chronic pain: Secondary | ICD-10-CM

## 2020-03-04 DIAGNOSIS — M25611 Stiffness of right shoulder, not elsewhere classified: Secondary | ICD-10-CM

## 2020-03-04 NOTE — Therapy (Signed)
[facility] [address] [place], [address] Phone: [phone]   Fax:  [phone]  Physical Therapy Treatment  Patient Details  Name: Chelsea Hudson MRN: [id number] Date of Birth: Apr 15, 1966 No data recorded  Encounter Date: 03/04/2020  PT End of Session - 03/04/20 1820    Visit Number  3    Number of Visits  16    Date for PT Re-Evaluation  04/23/20    PT Start Time  0545    PT Stop Time  0625    PT Time Calculation (min)  40 min    Activity Tolerance  Patient tolerated treatment well    Behavior During Therapy  [facility] for tasks assessed/performed       Past Medical History:  Diagnosis Date  . Arthritis    SHOULDER  . Breast cancer ([place]) 02/2017   rt breast  . Cancer ([place]) 02/28/2017   INVASIVE MAMMARY CARCINOMA WITH MUCINOUS FEATURES.  . Colonic diverticular abscess 06/21/2017   Colonoscopy 06/25/2017: No evidence of malignancy.  . Diabetes mellitus without complication ([place])   . Hypertension   . Irregular heart beat    PT STATES IT "SKIPS A BEAT"   . Obesity   . Personal history of chemotherapy   . Personal history of radiation therapy     Past Surgical History:  Procedure Laterality Date  . ANKLE SURGERY    . BREAST BIOPSY Right 02/28/2017   INVASIVE MAMMARY CARCINOMA WITH MUCINOUS FEATURES.  Marland Kitchen BREAST CYST ASPIRATION Right    NEG  . COLONOSCOPY WITH PROPOFOL N/A 06/25/2017   Procedure: COLONOSCOPY WITH PROPOFOL;  Surgeon: Jonathon Bellows, MD;  Location: [facility] ENDOSCOPY;  Service: Gastroenterology;  Laterality: N/A;  . MASTECTOMY, PARTIAL Right 07/30/2017   Procedure: MASTECTOMY PARTIAL;  Surgeon: Robert Bellow, MD;  Location: [facility] ORS;  Service: General;  Laterality: Right;  . PORTACATH PLACEMENT Left 03/15/2017   Procedure: INSERTION PORT-A-CATH;  Surgeon: Robert Bellow, MD;  Location: [facility] ORS;  Service: General;  Laterality: Left;  . RE-EXCISION OF BREAST LUMPECTOMY Right 08/17/2017     INVASIVE CARCINOMA EXTENDS TO NEW LATERAL MARGIN. /RE-EXCISION OF BREAST LUMPECTOMY;: Byrnett, Forest Gleason, MD;  [facility] ORS; General;  Laterality: Right;  . SENTINEL NODE BIOPSY Right 07/30/2017   Procedure: SENTINEL NODE BIOPSY;  Surgeon: Robert Bellow, MD;  Location: [facility] ORS;  Service: General;  Laterality: Right;  . SIMPLE MASTECTOMY WITH AXILLARY SENTINEL NODE BIOPSY Right 09/28/2017   Procedure: SIMPLE MASTECTOMY;  Surgeon: Robert Bellow, MD;  Location: [facility] ORS;  Service: General;  Laterality: Right;    There were no vitals filed for this visit.  Subjective Assessment - 03/04/20 1749    Subjective  Patient reports she was not too sore following, no pain today. Compliance with HEP.    Pertinent History  Pt is a 54 year old female s/p masectomy w/ lymphectomy in 2019, following chemo and radiation treatment for R breast cancer diagnosis. Has had lymphadema since beginning of 2020, and has seen improvement for this following OT treatment here since Dec 2020. She is being seen by PT today d/t progressive R shoulder weakness and decrease of motion over the past year d/t scar tissue, and lymphadema. 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 She reports difficulty with all overhead motion, lifting (reports she cannot lift a gallon of milk with RUE), and can only drive with hands at lower part of wheel. Reports occasional  tension/dull pain in the shoulder/neck pain that is random in nature, but usually comes on at the end of the day when she has used the UE often during the day. Worst pain over the past week: 7/10 best 0/10. Pt denies N/V, B&B changes, unexplained weight fluctuation, saddle paresthesia, fever, night sweats, or unrelenting night pain at this time    Limitations  Lifting;House hold activities    How long can you sit comfortably?  unlimited    How long can you stand comfortably?  20mins    How long can you walk comfortably?  60mins    Diagnostic tests  None     Patient Stated Goals  To be able to use my RUE like I do my LUE    Pain Onset  More than a month ago       Ther-Ex Supine flex 2x 10 1# DB; x10 with 2# DB with cuing for increased motion with good carry over Sidelying abd 1# 3x 10 with cuing to prevent shoulder hiking with good carry over  Manual Scar massage x101mins; increased time spent on lateral part of scar at axilla Mobilization with movement into flex 3min, abd 2min and ER 58min AROM following Posterior mobilization G3 6bouts 30sec for increased mobility                        PT Education - 03/04/20 1815    Education Details  therex technique/form    Person(s) Educated  Patient    Methods  Explanation;Demonstration;Verbal cues    Comprehension  Verbalized understanding;Returned demonstration;Verbal cues required       PT Short Term Goals - 02/23/20 1521      PT SHORT TERM GOAL #1   Title  Pt will be independent with HEP in order to improve strength and decrease pain in order to improve pain-free function at home and work.    Baseline  02/23/20 HEP given    Time  5    Period  Weeks    Status  New      PT SHORT TERM GOAL #2   Title  Patient will demonstrate full passive shoulder ROM in order to build full active motion needed for overhead tasks    Baseline  02/23/20 flex: 132d abd 102d    Time  5    Period  Weeks    Status  New        PT Long Term Goals - 02/23/20 1522      PT LONG TERM GOAL #1   Title  Patient will increase FOTO score to 59 to demonstrate predicted increase in functional mobility to complete ADLs    Baseline  02/23/20 38    Time  8    Period  Weeks    Status  New      PT LONG TERM GOAL #2   Title  Pt will increase gross periscapular strength to at least 4+/5 MMT grade in order to perform overhead household tasks    Baseline  02/23/20 R/L Lat 4/4+  Y 2/4 T 3+/4 I 4/4+    Time  8    Period  Weeks    Status  New      PT LONG TERM GOAL #3   Title  Patient will demonstrate  full active R shoulder abd and flex in order to complete overhead tasks    Baseline  02/23/20 flex: 110d abd 90d    Time  8  Period  Weeks    Status  New      PT LONG TERM GOAL #4   Title  Patient will be able to lift 8# DB onto a shelf at eye level to demonstrate ability to lift a gallon of milk to top shelf of a fridge    Baseline  02/23/20    Time  8    Period  Weeks    Status  New            Plan - 03/04/20 1824    Clinical Impression Statement  PT continued to utilize manual techniques for increased moility with success. Patient is able to comply with cuing for proper technique of therex with good success and motivation. Pt does struggle with scapulo-humeral rythym, but is able to work towards this. PT will continue progression as able.    Personal Factors and Comorbidities  Age;Comorbidity 1;Comorbidity 2;Fitness;Sex;Profession    Comorbidities  HTN, breast cancer    Examination-Participation Restrictions  Laundry;Meal Prep;Yard Work;Community Activity;Driving    Rehab Potential  Good    PT Frequency  2x / week    PT Duration  8 weeks    PT Treatment/Interventions  ADLs/Self Care Home Management;Electrical Stimulation;Therapeutic activities;Patient/family education;Taping;Joint Manipulations;Spinal Manipulations;Passive range of motion;Dry needling;Manual techniques;Functional mobility training;Ultrasound;Cryotherapy;Traction;Neuromuscular re-education;Therapeutic exercise;DME Instruction;Moist Heat;Iontophoresis 4mg /ml Dexamethasone    PT Next Visit Plan  scar tissue massage    PT Home Exercise Plan  [id number]    Consulted and Agree with Plan of Care  Patient       Patient will benefit from skilled therapeutic intervention in order to improve the following deficits and impairments:  Increased fascial restricitons, Pain, Improper body mechanics, Decreased mobility, Decreased scar mobility, Impaired tone, Postural dysfunction, Decreased activity tolerance, Decreased endurance,  Decreased range of motion, Decreased strength, Impaired UE functional use, Impaired flexibility, Increased edema  Visit Diagnosis: Chronic right shoulder pain  Stiffness of right shoulder, not elsewhere classified     Problem List Patient Active Problem List   Diagnosis Date Noted  . Well woman exam with routine gynecological exam 02/27/2020  . Hypokalemia 02/12/2020  . 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 Chemotherapy-induced neuropathy ([place]) 01/29/2019  . Pyuria 11/07/2018  . Urinary urgency 11/07/2018  . Hyperlipidemia associated with type 2 diabetes mellitus ([place]) 10/02/2018  . Neuropathy due to chemotherapeutic drug ([place]) 10/02/2018  . Myalgia due to statin 10/02/2018  . Controlled type 2 diabetes mellitus with hyperglycemia ([place]) 07/05/2018  . Diverticulosis of large intestine without diverticulitis 03/01/2018  . Personal history of chemotherapy 01/03/2018  . Malignant neoplasm of upper-outer quadrant of right breast in female, estrogen receptor positive ([place]) 03/05/2017  . Goals of care, counseling/discussion 03/05/2017  . Essential hypertension 01/05/2016   Shelton Silvas PT, DPT Shelton Silvas 03/04/2020, 6:26 PM  [facility] [address], [place], [address] Phone: [phone]   Fax:  [phone]  Name: Chelsea Hudson MRN: [id number] Date of Birth: 01-26-1966

## 2020-03-05 ENCOUNTER — Other Ambulatory Visit: Payer: Self-pay

## 2020-03-08 ENCOUNTER — Encounter: Payer: Disability Insurance | Admitting: Physical Therapy

## 2020-03-09 ENCOUNTER — Other Ambulatory Visit: Payer: Self-pay

## 2020-03-09 ENCOUNTER — Other Ambulatory Visit: Payer: Medicaid Other

## 2020-03-10 ENCOUNTER — Other Ambulatory Visit: Payer: Self-pay | Admitting: Family Medicine

## 2020-03-10 ENCOUNTER — Ambulatory Visit: Payer: Medicaid Other | Admitting: Physical Therapy

## 2020-03-10 ENCOUNTER — Encounter: Payer: Self-pay | Admitting: Physical Therapy

## 2020-03-10 DIAGNOSIS — E876 Hypokalemia: Secondary | ICD-10-CM

## 2020-03-10 DIAGNOSIS — G8929 Other chronic pain: Secondary | ICD-10-CM

## 2020-03-10 DIAGNOSIS — M25511 Pain in right shoulder: Secondary | ICD-10-CM

## 2020-03-10 DIAGNOSIS — I972 Postmastectomy lymphedema syndrome: Secondary | ICD-10-CM

## 2020-03-10 DIAGNOSIS — M25611 Stiffness of right shoulder, not elsewhere classified: Secondary | ICD-10-CM | POA: Diagnosis not present

## 2020-03-10 LAB — COMPLETE METABOLIC PANEL WITH GFR
AG Ratio: 1.3 (calc) (ref 1.0–2.5)
ALT: 19 U/L (ref 6–29)
AST: 16 U/L (ref 10–35)
Albumin: 3.9 g/dL (ref 3.6–5.1)
Alkaline phosphatase (APISO): 108 U/L (ref 37–153)
BUN: 19 mg/dL (ref 7–25)
CO2: 27 mmol/L (ref 20–32)
Calcium: 9.6 mg/dL (ref 8.6–10.4)
Chloride: 101 mmol/L (ref 98–110)
Creat: 0.82 mg/dL (ref 0.50–1.05)
GFR, Est African American: 95 mL/min/{1.73_m2} (ref >=60)
GFR, Est Non African American: 82 mL/min/{1.73_m2} (ref >=60)
Globulin: 3 g/dL (calc) (ref 1.9–3.7)
Glucose, Bld: 128 mg/dL — ABNORMAL HIGH (ref 65–99)
Potassium: 4 mmol/L (ref 3.5–5.3)
Sodium: 139 mmol/L (ref 135–146)
Total Bilirubin: 0.3 mg/dL (ref 0.2–1.2)
Total Protein: 6.9 g/dL (ref 6.1–8.1)

## 2020-03-10 LAB — CBC WITH DIFFERENTIAL/PLATELET
Absolute Monocytes: 386 cells/uL (ref 200–950)
Basophils Absolute: 42 cells/uL (ref 0–200)
Basophils Relative: 0.5 %
Eosinophils Absolute: 126 cells/uL (ref 15–500)
Eosinophils Relative: 1.5 %
HCT: 39.3 % (ref 35.0–45.0)
Hemoglobin: 12.3 g/dL (ref 11.7–15.5)
Lymphs Abs: 1428 cells/uL (ref 850–3900)
MCH: 25.9 pg — ABNORMAL LOW (ref 27.0–33.0)
MCHC: 31.3 g/dL — ABNORMAL LOW (ref 32.0–36.0)
MCV: 82.9 fL (ref 80.0–100.0)
MPV: 9.2 fL (ref 7.5–12.5)
Monocytes Relative: 4.6 %
Neutro Abs: 6418 cells/uL (ref 1500–7800)
Neutrophils Relative %: 76.4 %
Platelets: 392 10*3/uL (ref 140–400)
RBC: 4.74 10*6/uL (ref 3.80–5.10)
RDW: 16.7 % — ABNORMAL HIGH (ref 11.0–15.0)
Total Lymphocyte: 17 %
WBC: 8.4 10*3/uL (ref 3.8–10.8)

## 2020-03-10 MED ORDER — POTASSIUM CHLORIDE CRYS ER 10 MEQ PO TBCR: 10.0000 meq | EXTENDED_RELEASE_TABLET | Freq: Two times a day (BID) | ORAL | 1 refills | Status: DC

## 2020-03-10 NOTE — Therapy (Signed)
[facility] [address], [place], [address] Phone: [phone]   Fax:  [phone]  Physical Therapy Treatment  Patient Details  Name: Chelsea Hudson MRN: [id number] Date of Birth: 12-01-1965 No data recorded  Encounter Date: 03/10/2020  PT End of Session - 03/10/20 0923    Visit Number  4    Number of Visits  16    Date for PT Re-Evaluation  04/23/20    PT Start Time  0850    PT Stop Time  0928    PT Time Calculation (min)  38 min    Activity Tolerance  Patient tolerated treatment well    Behavior During Therapy  [facility] for tasks assessed/performed       Past Medical History:  Diagnosis Date  . Arthritis    SHOULDER  . Breast cancer ([place]) 02/2017   rt breast  . Cancer ([place]) 02/28/2017   INVASIVE MAMMARY CARCINOMA WITH MUCINOUS FEATURES.  . Colonic diverticular abscess 06/21/2017   Colonoscopy 06/25/2017: No evidence of malignancy.  . Diabetes mellitus without complication ([place])   . Hypertension   . Irregular heart beat    PT STATES IT "SKIPS A BEAT"   . Obesity   . Personal history of chemotherapy   . Personal history of radiation therapy     Past Surgical History:  Procedure Laterality Date  . ANKLE SURGERY    . BREAST BIOPSY Right 02/28/2017   INVASIVE MAMMARY CARCINOMA WITH MUCINOUS FEATURES.  Marland Kitchen BREAST CYST ASPIRATION Right    NEG  . COLONOSCOPY WITH PROPOFOL N/A 06/25/2017   Procedure: COLONOSCOPY WITH PROPOFOL;  Surgeon: Jonathon Bellows, MD;  Location: [facility] ENDOSCOPY;  Service: Gastroenterology;  Laterality: N/A;  . MASTECTOMY, PARTIAL Right 07/30/2017   Procedure: MASTECTOMY PARTIAL;  Surgeon: Robert Bellow, MD;  Location: [facility] ORS;  Service: General;  Laterality: Right;  . PORTACATH PLACEMENT Left 03/15/2017   Procedure: INSERTION PORT-A-CATH;  Surgeon: Robert Bellow, MD;  Location: [facility] ORS;  Service: General;  Laterality: Left;  . RE-EXCISION OF BREAST LUMPECTOMY Right 08/17/2017     INVASIVE CARCINOMA EXTENDS TO NEW LATERAL MARGIN. /RE-EXCISION OF BREAST LUMPECTOMY;: Byrnett, Forest Gleason, MD;  [facility] ORS; General;  Laterality: Right;  . SENTINEL NODE BIOPSY Right 07/30/2017   Procedure: SENTINEL NODE BIOPSY;  Surgeon: Robert Bellow, MD;  Location: [facility]S;  Service: General;  Laterality: Right;  . SIMPLE MASTECTOMY WITH AXILLARY SENTINEL NODE BIOPSY Right 09/28/2017   Procedure: SIMPLE MASTECTOMY;  Surgeon: Robert Bellow, MD;  Location: [facility] ORS;  Service: General;  Laterality: Right;    There were no vitals filed for this visit.  Subjective Assessment - 03/10/20 0851    Subjective  Patient reports she is continuing to have better motion, without much pain. Reports she still feels stiff at her scar but that the massage helped.    Pertinent History  Pt is a 54 year old female s/p masectomy w/ lymphectomy in 2019, following chemo and radiation treatment for R breast cancer diagnosis. Has had lymphadema since beginning of 2020, and has seen improvement for this following OT treatment here since Dec 2020. She is being seen by PT today d/t progressive R shoulder weakness and decrease of motion over the past year d/t scar tissue, and lymphadema. 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 She reports difficulty with all overhead motion, lifting (reports she cannot lift a gallon of milk with RUE), and can  only drive with hands at lower part of wheel. Reports occasional tension/dull pain in the shoulder/neck pain that is random in nature, but usually comes on at the end of the day when she has used the UE often during the day. Worst pain over the past week: 7/10 best 0/10. Pt denies N/V, B&B changes, unexplained weight fluctuation, saddle paresthesia, fever, night sweats, or unrelenting night pain at this time    Limitations  Lifting;House hold activities    How long can you sit comfortably?  unlimited    How long can you stand comfortably?  1mins    How long  can you walk comfortably?  68mins    Diagnostic tests  None    Patient Stated Goals  To be able to use my RUE like I do my LUE    Pain Onset  More than a month ago           Ther-Ex Pulleys abd and flex 63mins each way with 5-10sec hold in max of each position each rep Supine with 1/2 foam AAROM flex with PVC pipe x20 5sec hold Seated AAROM ER PVC pipe x20 5sec hold Doorway pec stretch 2x 4min hold  Manual Scar massage x59mins; increased time spent on lateral part of scar at axilla Mobilization with movement into flex 34min, abd 64min and ER 40min AROM following Posterior mobilization G3 6bouts 30sec for increased mobility                      PT Education - 03/10/20 0922    Education Details  therex technique/form    Person(s) Educated  Patient    Methods  Explanation;Demonstration;Verbal cues    Comprehension  Verbalized understanding;Returned demonstration;Verbal cues required       PT Short Term Goals - 02/23/20 1521      PT SHORT TERM GOAL #1   Title  Pt will be independent with HEP in order to improve strength and decrease pain in order to improve pain-free function at home and work.    Baseline  02/23/20 HEP given    Time  5    Period  Weeks    Status  New      PT SHORT TERM GOAL #2   Title  Patient will demonstrate full passive shoulder ROM in order to build full active motion needed for overhead tasks    Baseline  02/23/20 flex: 132d abd 102d    Time  5    Period  Weeks    Status  New        PT Long Term Goals - 02/23/20 1522      PT LONG TERM GOAL #1   Title  Patient will increase FOTO score to 59 to demonstrate predicted increase in functional mobility to complete ADLs    Baseline  02/23/20 38    Time  8    Period  Weeks    Status  New      PT LONG TERM GOAL #2   Title  Pt will increase gross periscapular strength to at least 4+/5 MMT grade in order to perform overhead household tasks    Baseline  02/23/20 R/L Lat 4/4+  Y 2/4 T 3+/4 I  4/4+    Time  8    Period  Weeks    Status  New      PT LONG TERM GOAL #3   Title  Patient will demonstrate full active R shoulder abd and flex in order to  complete overhead tasks    Baseline  02/23/20 flex: 110d abd 90d    Time  8    Period  Weeks    Status  New      PT LONG TERM GOAL #4   Title  Patient will be able to lift 8# DB onto a shelf at eye level to demonstrate ability to lift a gallon of milk to top shelf of a fridge    Baseline  02/23/20    Time  8    Period  Weeks    Status  New            Plan - 03/10/20 [id number]    Clinical Impression Statement  PT continued to utilize STM with manual techniques for increased scar tissue mobility and shoulder mobility with good success. Patient is able to demonstrate increased passive and AAROM motion this session with decreased pain response to STM. PT will continue AROM and strengthening progression tomorrow.    Personal Factors and Comorbidities  Age;Comorbidity 1;Comorbidity 2;Fitness;Sex;Profession    Comorbidities  HTN, breast cancer    Examination-Participation Restrictions  Laundry;Meal Prep;Yard Work;Community Activity;Driving    Stability/Clinical Decision Making  Evolving/Moderate complexity    Clinical Decision Making  Moderate    Rehab Potential  Good    PT Frequency  2x / week    PT Duration  8 weeks    PT Treatment/Interventions  ADLs/Self Care Home Management;Electrical Stimulation;Therapeutic activities;Patient/family education;Taping;Joint Manipulations;Spinal Manipulations;Passive range of motion;Dry needling;Manual techniques;Functional mobility training;Ultrasound;Cryotherapy;Traction;Neuromuscular re-education;Therapeutic exercise;DME Instruction;Moist Heat;Iontophoresis 4mg /ml Dexamethasone    PT Next Visit Plan  scar tissue massage    PT Home Exercise Plan  [id number]    Consulted and Agree with Plan of Care  Patient       Patient will benefit from skilled therapeutic intervention in order to improve the  following deficits and impairments:  Increased fascial restricitons, Pain, Improper body mechanics, Decreased mobility, Decreased scar mobility, Impaired tone, Postural dysfunction, Decreased activity tolerance, Decreased endurance, Decreased range of motion, Decreased strength, Impaired UE functional use, Impaired flexibility, Increased edema  Visit Diagnosis: Chronic right shoulder pain  Stiffness of right shoulder, not elsewhere classified  Postmastectomy lymphedema syndrome     Problem List Patient Active Problem List   Diagnosis Date Noted  . Well woman exam with routine gynecological exam 02/27/2020  . Hypokalemia 02/12/2020  . 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 Chemotherapy-induced neuropathy ([place]) 01/29/2019  . Pyuria 11/07/2018  . Urinary urgency 11/07/2018  . Hyperlipidemia associated with type 2 diabetes mellitus ([place]) 10/02/2018  . Neuropathy due to chemotherapeutic drug ([place]) 10/02/2018  . Myalgia due to statin 10/02/2018  . Controlled type 2 diabetes mellitus with hyperglycemia ([place]) 07/05/2018  . Diverticulosis of large intestine without diverticulitis 03/01/2018  . Personal history of chemotherapy 01/03/2018  . Malignant neoplasm of upper-outer quadrant of right breast in female, estrogen receptor positive ([place]) 03/05/2017  . Goals of care, counseling/discussion 03/05/2017  . Essential hypertension 01/05/2016   Shelton Silvas PT, DPT Shelton Silvas 03/10/2020, 9:32 AM  [facility] [address], [place], [address] Phone: [phone]   Fax:  [phone]  Name: KINGA FLATHERS MRN: [id number] Date of Birth: 10/31/1966

## 2020-03-11 ENCOUNTER — Encounter: Payer: Self-pay | Admitting: Physical Therapy

## 2020-03-11 ENCOUNTER — Ambulatory Visit: Payer: Medicaid Other | Admitting: Physical Therapy

## 2020-03-11 ENCOUNTER — Other Ambulatory Visit: Payer: Self-pay

## 2020-03-11 DIAGNOSIS — G8929 Other chronic pain: Secondary | ICD-10-CM

## 2020-03-11 DIAGNOSIS — M25611 Stiffness of right shoulder, not elsewhere classified: Secondary | ICD-10-CM | POA: Diagnosis not present

## 2020-03-11 DIAGNOSIS — I972 Postmastectomy lymphedema syndrome: Secondary | ICD-10-CM

## 2020-03-11 NOTE — Therapy (Signed)
[facility] [address], [place], [address] Phone: [phone]   Fax:  [phone]  Physical Therapy Treatment  Patient Details  Name: Chelsea Hudson MRN: [id number] Date of Birth: [birth year] No data recorded  Encounter Date: 03/11/2020  PT End of Session - 03/11/20 1744    Visit Number  5    Number of Visits  16    Date for PT Re-Evaluation  04/23/20    PT Start Time  0538    PT Stop Time  0617    PT Time Calculation (min)  39 min    Activity Tolerance  Patient tolerated treatment well    Behavior During Therapy  [facility] for tasks assessed/performed       Past Medical History:  Diagnosis Date  . Arthritis    SHOULDER  . Breast cancer ([place]) 02/2017   rt breast  . Cancer ([place]) 02/28/2017   INVASIVE MAMMARY CARCINOMA WITH MUCINOUS FEATURES.  . Colonic diverticular abscess 06/21/2017   Colonoscopy 06/25/2017: No evidence of malignancy.  . Diabetes mellitus without complication ([place])   . Hypertension   . Irregular heart beat    PT STATES IT "SKIPS A BEAT"   . Obesity   . Personal history of chemotherapy   . Personal history of radiation therapy     Past Surgical History:  Procedure Laterality Date  . ANKLE SURGERY    . BREAST BIOPSY Right 02/28/2017   INVASIVE MAMMARY CARCINOMA WITH MUCINOUS FEATURES.  Marland Kitchen BREAST CYST ASPIRATION Right    NEG  . COLONOSCOPY WITH PROPOFOL N/A 06/25/2017   Procedure: COLONOSCOPY WITH PROPOFOL;  Surgeon: Jonathon Bellows, MD;  Location: [facility] ENDOSCOPY;  Service: Gastroenterology;  Laterality: N/A;  . MASTECTOMY, PARTIAL Right 07/30/2017   Procedure: MASTECTOMY PARTIAL;  Surgeon: Robert Bellow, MD;  Location: [facility] ORS;  Service: General;  Laterality: Right;  . PORTACATH PLACEMENT Left 03/15/2017   Procedure: INSERTION PORT-A-CATH;  Surgeon: Robert Bellow, MD;  Location: [facility] ORS;  Service: General;  Laterality: Left;  . RE-EXCISION OF BREAST LUMPECTOMY Right 08/17/2017     INVASIVE CARCINOMA EXTENDS TO NEW LATERAL MARGIN. /RE-EXCISION OF BREAST LUMPECTOMY;: Byrnett, Forest Gleason, MD;  [facility] ORS; General;  Laterality: Right;  . SENTINEL NODE BIOPSY Right 07/30/2017   Procedure: SENTINEL NODE BIOPSY;  Surgeon: Robert Bellow, MD;  Location: [facility] ORS;  Service: General;  Laterality: Right;  . SIMPLE MASTECTOMY WITH AXILLARY SENTINEL NODE BIOPSY Right 09/28/2017   Procedure: SIMPLE MASTECTOMY;  Surgeon: Robert Bellow, MD;  Location: [facility] ORS;  Service: General;  Laterality: Right;    There were no vitals filed for this visit.  Subjective Assessment - 03/11/20 1740    Subjective  Patient reports motion was better following yesterdays session, and has felt good into today.    Pertinent History  Pt is a 54 year old female s/p masectomy w/ lymphectomy in 2019, following chemo and radiation treatment for R breast cancer diagnosis. Has had lymphadema since beginning of 2020, and has seen improvement for this following OT treatment here since Dec 2020. She is being seen by PT today d/t progressive R shoulder weakness and decrease of motion over the past year d/t scar tissue, and lymphadema. 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 She reports difficulty with all overhead motion, lifting (reports she cannot lift a gallon of milk with RUE), and can only drive with hands at lower part of wheel. Reports occasional  tension/dull pain in the shoulder/neck pain that is random in nature, but usually comes on at the end of the day when she has used the UE often during the day. Worst pain over the past week: 7/10 best 0/10. Pt denies N/V, B&B changes, unexplained weight fluctuation, saddle paresthesia, fever, night sweats, or unrelenting night pain at this time    Limitations  Lifting;House hold activities    How long can you sit comfortably?  unlimited    How long can you stand comfortably?  39mins    How long can you walk comfortably?  43mins    Diagnostic  tests  None    Patient Stated Goals  To be able to use my RUE like I do my LUE    Pain Onset  More than a month ago           Ther-Ex Pulleys abd 76mins flex 46mins with good motion with this High rows 10# 3x 10 with cuing initially for technique with good carry over following Lat pulldown 20# x10; 25# 2x10  with min cuing to prevent shoulder hiking with good carry over Band pull aparts GTB 3x6 with min cuing for proper technique with scapular retraction with good carry over following Seated military press BW x10 1# DB 2x6 with cuing for proper technique with maintained scapular retraction with good carry over Standing ER GTB 3x 10 with min cuing for eccentric control with good carry over Doorway pec stretch 104min                  PT Education - 03/11/20 1744    Education Details  therex technique/form    Person(s) Educated  Patient    Methods  Explanation;Demonstration;Verbal cues;Tactile cues    Comprehension  Verbalized understanding;Returned demonstration;Verbal cues required;Tactile cues required       PT Short Term Goals - 02/23/20 1521      PT SHORT TERM GOAL #1   Title  Pt will be independent with HEP in order to improve strength and decrease pain in order to improve pain-free function at home and work.    Baseline  02/23/20 HEP given    Time  5    Period  Weeks    Status  New      PT SHORT TERM GOAL #2   Title  Patient will demonstrate full passive shoulder ROM in order to build full active motion needed for overhead tasks    Baseline  02/23/20 flex: 132d abd 102d    Time  5    Period  Weeks    Status  New        PT Long Term Goals - 02/23/20 1522      PT LONG TERM GOAL #1   Title  Patient will increase FOTO score to 59 to demonstrate predicted increase in functional mobility to complete ADLs    Baseline  02/23/20 38    Time  8    Period  Weeks    Status  New      PT LONG TERM GOAL #2   Title  Pt will increase gross periscapular strength to at  least 4+/5 MMT grade in order to perform overhead household tasks    Baseline  02/23/20 R/L Lat 4/4+  Y 2/4 T 3+/4 I 4/4+    Time  8    Period  Weeks    Status  New      PT LONG TERM GOAL #3   Title  Patient will  demonstrate full active R shoulder abd and flex in order to complete overhead tasks    Baseline  02/23/20 flex: 110d abd 90d    Time  8    Period  Weeks    Status  New      PT LONG TERM GOAL #4   Title  Patient will be able to lift 8# DB onto a shelf at eye level to demonstrate ability to lift a gallon of milk to top shelf of a fridge    Baseline  02/23/20    Time  8    Period  Weeks    Status  New            Plan - 03/11/20 1755    Clinical Impression Statement  PT initiated therex progression for increased strength and scapulo-humeral rhythm with good success. Patient is able to comply with all cuing for proper therex technique and demonstrates good motivation throughout session. PT will continue progression as able.    Personal Factors and Comorbidities  Age;Comorbidity 1;Comorbidity 2;Fitness;Sex;Profession    Comorbidities  HTN, breast cancer    Examination-Participation Restrictions  Laundry;Meal Prep;Yard Work;Community Activity;Driving    Stability/Clinical Decision Making  Evolving/Moderate complexity    Clinical Decision Making  Moderate    Rehab Potential  Good    PT Frequency  2x / week    PT Duration  8 weeks    PT Treatment/Interventions  ADLs/Self Care Home Management;Electrical Stimulation;Therapeutic activities;Patient/family education;Taping;Joint Manipulations;Spinal Manipulations;Passive range of motion;Dry needling;Manual techniques;Functional mobility training;Ultrasound;Cryotherapy;Traction;Neuromuscular re-education;Therapeutic exercise;DME Instruction;Moist Heat;Iontophoresis 4mg /ml Dexamethasone    PT Next Visit Plan  scar tissue massage    PT Home Exercise Plan  [id number]    Consulted and Agree with Plan of Care  Patient       Patient will  benefit from skilled therapeutic intervention in order to improve the following deficits and impairments:  Increased fascial restricitons, Pain, Improper body mechanics, Decreased mobility, Decreased scar mobility, Impaired tone, Postural dysfunction, Decreased activity tolerance, Decreased endurance, Decreased range of motion, Decreased strength, Impaired UE functional use, Impaired flexibility, Increased edema  Visit Diagnosis: Chronic right shoulder pain  Stiffness of right shoulder, not elsewhere classified  Postmastectomy lymphedema syndrome     Problem List Patient Active Problem List   Diagnosis Date Noted  . Well woman exam with routine gynecological exam 02/27/2020  . Hypokalemia 02/12/2020  . 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 Chemotherapy-induced neuropathy ([place]) 01/29/2019  . Pyuria 11/07/2018  . Urinary urgency 11/07/2018  . Hyperlipidemia associated with type 2 diabetes mellitus ([place]) 10/02/2018  . Neuropathy due to chemotherapeutic drug ([place]) 10/02/2018  . Myalgia due to statin 10/02/2018  . Controlled type 2 diabetes mellitus with hyperglycemia ([place]) 07/05/2018  . Diverticulosis of large intestine without diverticulitis 03/01/2018  . Personal history of chemotherapy 01/03/2018  . Malignant neoplasm of upper-outer quadrant of right breast in female, estrogen receptor positive ([place]) 03/05/2017  . Goals of care, counseling/discussion 03/05/2017  . Essential hypertension 01/05/2016   Shelton Silvas PT, DPT Shelton Silvas 03/11/2020, 6:17 PM  [facility] [address] [place], [address] Phone: [phone]   Fax:  [phone]  Name: Chelsea Hudson MRN: [id number] Date of Birth: 1966/09/26

## 2020-03-12 ENCOUNTER — Telehealth: Payer: Self-pay

## 2020-03-12 NOTE — Telephone Encounter (Signed)
Copied from [place] [id number]. Topic: General - Inquiry >> Mar 11, 2020 10:20 AM Virl Axe D wrote: Reason for CRM: Pt called to follow up on lab results from 03/09/20. Requesting callback.   I left a detail message on the patient vm with her lab results.

## 2020-03-16 ENCOUNTER — Other Ambulatory Visit: Payer: Self-pay

## 2020-03-16 ENCOUNTER — Encounter: Payer: Self-pay | Admitting: Physical Therapy

## 2020-03-16 ENCOUNTER — Ambulatory Visit: Payer: Medicaid Other | Admitting: Physical Therapy

## 2020-03-16 DIAGNOSIS — M25611 Stiffness of right shoulder, not elsewhere classified: Secondary | ICD-10-CM

## 2020-03-16 DIAGNOSIS — G8929 Other chronic pain: Secondary | ICD-10-CM

## 2020-03-16 NOTE — Therapy (Signed)
[facility] [address] [place], [address] Phone: [phone]   Fax:  [phone]  Physical Therapy Treatment  Patient Details  Name: Chelsea Hudson MRN: [id number] Date of Birth: Oct 12, 1966 No data recorded  Encounter Date: 03/16/2020  PT End of Session - 03/16/20 1751    Visit Number  6    Number of Visits  16    Date for PT Re-Evaluation  04/23/20    PT Start Time  0547    PT Stop Time  0626    PT Time Calculation (min)  39 min    Activity Tolerance  Patient tolerated treatment well    Behavior During Therapy  [facility] for tasks assessed/performed       Past Medical History:  Diagnosis Date  . Arthritis    SHOULDER  . Breast cancer ([place]) 02/2017   rt breast  . Cancer ([place]) 02/28/2017   INVASIVE MAMMARY CARCINOMA WITH MUCINOUS FEATURES.  . Colonic diverticular abscess 06/21/2017   Colonoscopy 06/25/2017: No evidence of malignancy.  . Diabetes mellitus without complication ([place])   . Hypertension   . Irregular heart beat    PT STATES IT "SKIPS A BEAT"   . Obesity   . Personal history of chemotherapy   . Personal history of radiation therapy     Past Surgical History:  Procedure Laterality Date  . ANKLE SURGERY    . BREAST BIOPSY Right 02/28/2017   INVASIVE MAMMARY CARCINOMA WITH MUCINOUS FEATURES.  Marland Kitchen BREAST CYST ASPIRATION Right    NEG  . COLONOSCOPY WITH PROPOFOL N/A 06/25/2017   Procedure: COLONOSCOPY WITH PROPOFOL;  Surgeon: Jonathon Bellows, MD;  Location: [facility] ENDOSCOPY;  Service: Gastroenterology;  Laterality: N/A;  . MASTECTOMY, PARTIAL Right 07/30/2017   Procedure: MASTECTOMY PARTIAL;  Surgeon: Robert Bellow, MD;  Location: [facility] ORS;  Service: General;  Laterality: Right;  . PORTACATH PLACEMENT Left 03/15/2017   Procedure: INSERTION PORT-A-CATH;  Surgeon: Robert Bellow, MD;  Location: [facility] ORS;  Service: General;  Laterality: Left;  . RE-EXCISION OF BREAST LUMPECTOMY Right 08/17/2017     INVASIVE CARCINOMA EXTENDS TO NEW LATERAL MARGIN. /RE-EXCISION OF BREAST LUMPECTOMY;: Byrnett, Forest Gleason, MD;  [facility] ORS; General;  Laterality: Right;  . SENTINEL NODE BIOPSY Right 07/30/2017   Procedure: SENTINEL NODE BIOPSY;  Surgeon: Robert Bellow, MD;  Location: [facility] ORS;  Service: General;  Laterality: Right;  . SIMPLE MASTECTOMY WITH AXILLARY SENTINEL NODE BIOPSY Right 09/28/2017   Procedure: SIMPLE MASTECTOMY;  Surgeon: Robert Bellow, MD;  Location: [facility] ORS;  Service: General;  Laterality: Right;    There were no vitals filed for this visit.  Subjective Assessment - 03/16/20 1752    Subjective  Reports no pain today, tension at scar. Patient reports compliance with HEP, and thinks she is maintaining motion well.    Pertinent History  Pt is a 54 year old female s/p masectomy w/ lymphectomy in 2019, following chemo and radiation treatment for R breast cancer diagnosis. Has had lymphadema since beginning of 2020, and has seen improvement for this following OT treatment here since Dec 2020. She is being seen by PT today d/t progressive R shoulder weakness and decrease of motion over the past year d/t scar tissue, and lymphadema. 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 She reports difficulty with all overhead motion, lifting (reports she cannot lift a gallon of milk with RUE), and can only drive with hands at lower  part of wheel. Reports occasional tension/dull pain in the shoulder/neck pain that is random in nature, but usually comes on at the end of the day when she has used the UE often during the day. Worst pain over the past week: 7/10 best 0/10. Pt denies N/V, B&B changes, unexplained weight fluctuation, saddle paresthesia, fever, night sweats, or unrelenting night pain at this time    Limitations  Lifting;House hold activities    How long can you sit comfortably?  unlimited    How long can you stand comfortably?  43mins    How long can you walk comfortably?   34mins    Diagnostic tests  None    Patient Stated Goals  To be able to use my RUE like I do my LUE    Pain Onset  More than a month ago         Ther-Ex Pulleys abd and flex 54mins each way with 5-10sec hold in max of each position each rep Supine with 1/2 foam AAROM flex with PVC pipe x12 5sec hold; with 5# AW on PVC x12 Seated AAROM ER PVC pipe x20 5sec hold Doorway pec stretch 2x 38min hold  Manual Scar massage x64mins; increased time spent on lateral part of scar at axilla Mobilization with movement into flex 88min, abd 69min and ER 36min AROM following Posterior mobilization G3 6bouts 30sec for increased mobility                          PT Education - 03/16/20 1750    Education Details  therex form    Person(s) Educated  Patient    Methods  Explanation;Demonstration;Verbal cues    Comprehension  Verbalized understanding;Returned demonstration;Verbal cues required       PT Short Term Goals - 02/23/20 1521      PT SHORT TERM GOAL #1   Title  Pt will be independent with HEP in order to improve strength and decrease pain in order to improve pain-free function at home and work.    Baseline  02/23/20 HEP given    Time  5    Period  Weeks    Status  New      PT SHORT TERM GOAL #2   Title  Patient will demonstrate full passive shoulder ROM in order to build full active motion needed for overhead tasks    Baseline  02/23/20 flex: 132d abd 102d    Time  5    Period  Weeks    Status  New        PT Long Term Goals - 02/23/20 1522      PT LONG TERM GOAL #1   Title  Patient will increase FOTO score to 59 to demonstrate predicted increase in functional mobility to complete ADLs    Baseline  02/23/20 38    Time  8    Period  Weeks    Status  New      PT LONG TERM GOAL #2   Title  Pt will increase gross periscapular strength to at least 4+/5 MMT grade in order to perform overhead household tasks    Baseline  02/23/20 R/L Lat 4/4+  Y 2/4 T 3+/4 I 4/4+     Time  8    Period  Weeks    Status  New      PT LONG TERM GOAL #3   Title  Patient will demonstrate full active R shoulder abd and flex in  order to complete overhead tasks    Baseline  02/23/20 flex: 110d abd 90d    Time  8    Period  Weeks    Status  New      PT LONG TERM GOAL #4   Title  Patient will be able to lift 8# DB onto a shelf at eye level to demonstrate ability to lift a gallon of milk to top shelf of a fridge    Baseline  02/23/20    Time  8    Period  Weeks    Status  New            Plan - 03/16/20 1754    Clinical Impression Statement  PT with focus on mobility today, utilizing manual techniques as well as therex for mobility with good success. Patient is able to comply with all cuing for therex for proper technique/form. PT will continue progression as able.    Personal Factors and Comorbidities  Age;Comorbidity 1;Comorbidity 2;Fitness;Sex;Profession    Comorbidities  HTN, breast cancer    Examination-Participation Restrictions  Laundry;Meal Prep;Yard Work;Community Activity;Driving    Stability/Clinical Decision Making  Evolving/Moderate complexity    Clinical Decision Making  Moderate    Rehab Potential  Good    PT Frequency  2x / week    PT Duration  8 weeks    PT Treatment/Interventions  ADLs/Self Care Home Management;Electrical Stimulation;Therapeutic activities;Patient/family education;Taping;Joint Manipulations;Spinal Manipulations;Passive range of motion;Dry needling;Manual techniques;Functional mobility training;Ultrasound;Cryotherapy;Traction;Neuromuscular re-education;Therapeutic exercise;DME Instruction;Moist Heat;Iontophoresis 4mg /ml Dexamethasone    PT Next Visit Plan  scar tissue massage    PT Home Exercise Plan  [id number]    Consulted and Agree with Plan of Care  Patient       Patient will benefit from skilled therapeutic intervention in order to improve the following deficits and impairments:  Increased fascial restricitons, Pain, Improper body  mechanics, Decreased mobility, Decreased scar mobility, Impaired tone, Postural dysfunction, Decreased activity tolerance, Decreased endurance, Decreased range of motion, Decreased strength, Impaired UE functional use, Impaired flexibility, Increased edema  Visit Diagnosis: Chronic right shoulder pain  Stiffness of right shoulder, not elsewhere classified     Problem List Patient Active Problem List   Diagnosis Date Noted  . Well woman exam with routine gynecological exam 02/27/2020  . Hypokalemia 02/12/2020  . 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 Chemotherapy-induced neuropathy ([place]) 01/29/2019  . Pyuria 11/07/2018  . Urinary urgency 11/07/2018  . Hyperlipidemia associated with type 2 diabetes mellitus ([place]) 10/02/2018  . Neuropathy due to chemotherapeutic drug ([place]) 10/02/2018  . Myalgia due to statin 10/02/2018  . Controlled type 2 diabetes mellitus with hyperglycemia ([place]) 07/05/2018  . Diverticulosis of large intestine without diverticulitis 03/01/2018  . Personal history of chemotherapy 01/03/2018  . Malignant neoplasm of upper-outer quadrant of right breast in female, estrogen receptor positive ([place]) 03/05/2017  . Goals of care, counseling/discussion 03/05/2017  . Essential hypertension 01/05/2016   Shelton Silvas PT, DPT Shelton Silvas 03/16/2020, 6:24 PM  [facility] [address] [place], [address] Phone: [phone]   Fax:  [phone]  Name: Chelsea Hudson MRN: [id number] Date of Birth: 1966/06/02

## 2020-03-17 ENCOUNTER — Ambulatory Visit: Payer: Medicaid Other | Admitting: Physical Therapy

## 2020-03-18 ENCOUNTER — Ambulatory Visit: Payer: Medicaid Other | Admitting: Physical Therapy

## 2020-03-18 ENCOUNTER — Encounter: Payer: Self-pay | Admitting: Physical Therapy

## 2020-03-18 ENCOUNTER — Other Ambulatory Visit: Payer: Self-pay

## 2020-03-18 DIAGNOSIS — I972 Postmastectomy lymphedema syndrome: Secondary | ICD-10-CM

## 2020-03-18 DIAGNOSIS — G8929 Other chronic pain: Secondary | ICD-10-CM

## 2020-03-18 DIAGNOSIS — M25511 Pain in right shoulder: Secondary | ICD-10-CM

## 2020-03-18 DIAGNOSIS — M25611 Stiffness of right shoulder, not elsewhere classified: Secondary | ICD-10-CM | POA: Diagnosis not present

## 2020-03-18 NOTE — Therapy (Signed)
[facility] [address] [place], [address] Phone: [phone]   Fax:  [phone]  Physical Therapy Treatment  Patient Details  Name: Chelsea Hudson MRN: [id number] Date of Birth: 02-Apr-1966 No data recorded  Encounter Date: 03/18/2020  PT End of Session - 03/18/20 1348    Visit Number  7    Number of Visits  16    Date for PT Re-Evaluation  04/23/20    PT Start Time  0154    PT Stop Time  0232    PT Time Calculation (min)  38 min    Activity Tolerance  Patient tolerated treatment well    Behavior During Therapy  [facility] for tasks assessed/performed       Past Medical History:  Diagnosis Date  . Arthritis    SHOULDER  . Breast cancer ([place]) 02/2017   rt breast  . Cancer ([place]) 02/28/2017   INVASIVE MAMMARY CARCINOMA WITH MUCINOUS FEATURES.  . Colonic diverticular abscess 06/21/2017   Colonoscopy 06/25/2017: No evidence of malignancy.  . Diabetes mellitus without complication ([place])   . Hypertension   . Irregular heart beat    PT STATES IT "SKIPS A BEAT"   . Obesity   . Personal history of chemotherapy   . Personal history of radiation therapy     Past Surgical History:  Procedure Laterality Date  . ANKLE SURGERY    . BREAST BIOPSY Right 02/28/2017   INVASIVE MAMMARY CARCINOMA WITH MUCINOUS FEATURES.  Marland Kitchen BREAST CYST ASPIRATION Right    NEG  . COLONOSCOPY WITH PROPOFOL N/A 06/25/2017   Procedure: COLONOSCOPY WITH PROPOFOL;  Surgeon: Jonathon Bellows, MD;  Location: [facility] ENDOSCOPY;  Service: Gastroenterology;  Laterality: N/A;  . MASTECTOMY, PARTIAL Right 07/30/2017   Procedure: MASTECTOMY PARTIAL;  Surgeon: Robert Bellow, MD;  Location: [facility] ORS;  Service: General;  Laterality: Right;  . PORTACATH PLACEMENT Left 03/15/2017   Procedure: INSERTION PORT-A-CATH;  Surgeon: Robert Bellow, MD;  Location: [facility] ORS;  Service: General;  Laterality: Left;  . RE-EXCISION OF BREAST LUMPECTOMY Right 08/17/2017     INVASIVE CARCINOMA EXTENDS TO NEW LATERAL MARGIN. /RE-EXCISION OF BREAST LUMPECTOMY;: Byrnett, Forest Gleason, MD;  [facility] ORS; General;  Laterality: Right;  . SENTINEL NODE BIOPSY Right 07/30/2017   Procedure: SENTINEL NODE BIOPSY;  Surgeon: Robert Bellow, MD;  Location: [facility] ORS;  Service: General;  Laterality: Right;  . SIMPLE MASTECTOMY WITH AXILLARY SENTINEL NODE BIOPSY Right 09/28/2017   Procedure: SIMPLE MASTECTOMY;  Surgeon: Robert Bellow, MD;  Location: [facility] ORS;  Service: General;  Laterality: Right;    There were no vitals filed for this visit.  Subjective Assessment - 03/18/20 1350    Subjective  Patient reports some continued tension in shoulder, but minimal pain. Good compliance with HEP, and is doing well overall.    Pertinent History  Pt is a 54 year old female s/p masectomy w/ lymphectomy in 2019, following chemo and radiation treatment for R breast cancer diagnosis. Has had lymphadema since beginning of 2020, and has seen improvement for this following OT treatment here since Dec 2020. She is being seen by PT today d/t progressive R shoulder weakness and decrease of motion over the past year d/t scar tissue, and lymphadema. 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 She reports difficulty with all overhead motion, lifting (reports she cannot lift a gallon of milk with RUE), and can only drive with hands at lower  part of wheel. Reports occasional tension/dull pain in the shoulder/neck pain that is random in nature, but usually comes on at the end of the day when she has used the UE often during the day. Worst pain over the past week: 7/10 best 0/10. Pt denies N/V, B&B changes, unexplained weight fluctuation, saddle paresthesia, fever, night sweats, or unrelenting night pain at this time    Limitations  Lifting;House hold activities    How long can you sit comfortably?  unlimited    How long can you stand comfortably?  81mins    How long can you walk  comfortably?  19mins    Diagnostic tests  None    Patient Stated Goals  To be able to use my RUE like I do my LUE    Pain Onset  More than a month ago       Ther-Ex Pulleys abd 68mins flex 46mins with good motion with this; near full AAROM flex and abd with AROM flex: 160d abd 90d High rows 10# 3x 10 with cuing initially for eccentric control with good carry over  Standing ER at wall with back against wall 3x 8 with good carry over following cuing for technique Standing RUE abd 2# DB 3x 8 with cuing for posture and to prevent shoulder hiking with good carry over Bent over flys 1# DB 3x 10/9/8 with good carry over of all cuing and demo for proper technique Seated military press BW x10 1# DB 2x6 with cuing for proper technique with maintained scapular retraction with good carry over  Doorway pec stretch 65min                        PT Education - 03/18/20 1348    Education Details  therex form    Person(s) Educated  Patient    Methods  Explanation;Demonstration;Verbal cues    Comprehension  Verbalized understanding;Returned demonstration;Verbal cues required       PT Short Term Goals - 02/23/20 1521      PT SHORT TERM GOAL #1   Title  Pt will be independent with HEP in order to improve strength and decrease pain in order to improve pain-free function at home and work.    Baseline  02/23/20 HEP given    Time  5    Period  Weeks    Status  New      PT SHORT TERM GOAL #2   Title  Patient will demonstrate full passive shoulder ROM in order to build full active motion needed for overhead tasks    Baseline  02/23/20 flex: 132d abd 102d    Time  5    Period  Weeks    Status  New        PT Long Term Goals - 02/23/20 1522      PT LONG TERM GOAL #1   Title  Patient will increase FOTO score to 59 to demonstrate predicted increase in functional mobility to complete ADLs    Baseline  02/23/20 38    Time  8    Period  Weeks    Status  New      PT LONG TERM GOAL #2    Title  Pt will increase gross periscapular strength to at least 4+/5 MMT grade in order to perform overhead household tasks    Baseline  02/23/20 R/L Lat 4/4+  Y 2/4 T 3+/4 I 4/4+    Time  8    Period  Weeks    Status  New      PT LONG TERM GOAL #3   Title  Patient will demonstrate full active R shoulder abd and flex in order to complete overhead tasks    Baseline  02/23/20 flex: 110d abd 90d    Time  8    Period  Weeks    Status  New      PT LONG TERM GOAL #4   Title  Patient will be able to lift 8# DB onto a shelf at eye level to demonstrate ability to lift a gallon of milk to top shelf of a fridge    Baseline  02/23/20    Time  8    Period  Weeks    Status  New            Plan - 03/18/20 1404    Clinical Impression Statement  PT session focus on increased strengthening of shoulder and periscapular musculature with good success. Patient is motivated throughout session and is able to comply with all cuing for proper technique of therex with neutral spine, without increased pain. PT will continue progression as able    Personal Factors and Comorbidities  Age;Comorbidity 1;Comorbidity 2;Fitness;Sex;Profession    Comorbidities  HTN, breast cancer    Examination-Participation Restrictions  Laundry;Meal Prep;Yard Work;Community Activity;Driving    Stability/Clinical Decision Making  Evolving/Moderate complexity    Clinical Decision Making  Moderate    Rehab Potential  Good    PT Frequency  2x / week    PT Duration  8 weeks    PT Treatment/Interventions  ADLs/Self Care Home Management;Electrical Stimulation;Therapeutic activities;Patient/family education;Taping;Joint Manipulations;Spinal Manipulations;Passive range of motion;Dry needling;Manual techniques;Functional mobility training;Ultrasound;Cryotherapy;Traction;Neuromuscular re-education;Therapeutic exercise;DME Instruction;Moist Heat;Iontophoresis 4mg /ml Dexamethasone    PT Next Visit Plan  scar tissue massage    PT Home Exercise  Plan  [id number]    Consulted and Agree with Plan of Care  Patient       Patient will benefit from skilled therapeutic intervention in order to improve the following deficits and impairments:  Increased fascial restricitons, Pain, Improper body mechanics, Decreased mobility, Decreased scar mobility, Impaired tone, Postural dysfunction, Decreased activity tolerance, Decreased endurance, Decreased range of motion, Decreased strength, Impaired UE functional use, Impaired flexibility, Increased edema  Visit Diagnosis: Chronic right shoulder pain  Stiffness of right shoulder, not elsewhere classified  Postmastectomy lymphedema syndrome     Problem List Patient Active Problem List   Diagnosis Date Noted  . Well woman exam with routine gynecological exam 02/27/2020  . Hypokalemia 02/12/2020  . 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 Chemotherapy-induced neuropathy ([place]) 01/29/2019  . Pyuria 11/07/2018  . Urinary urgency 11/07/2018  . Hyperlipidemia associated with type 2 diabetes mellitus ([place]) 10/02/2018  . Neuropathy due to chemotherapeutic drug ([place]) 10/02/2018  . Myalgia due to statin 10/02/2018  . Controlled type 2 diabetes mellitus with hyperglycemia ([place]) 07/05/2018  . Diverticulosis of large intestine without diverticulitis 03/01/2018  . Personal history of chemotherapy 01/03/2018  . Malignant neoplasm of upper-outer quadrant of right breast in female, estrogen receptor positive ([place]) 03/05/2017  . Goals of care, counseling/discussion 03/05/2017  . Essential hypertension 01/05/2016   Shelton Silvas PT, DPT` Shelton Silvas 03/18/2020, 2:39 PM  [place] [facility] [address], [place], [address] Phone: [phone]   Fax:  [phone]  Name: Chelsea Hudson MRN: [id number] Date of Birth: 10-17-1966

## 2020-03-19 ENCOUNTER — Encounter: Payer: Disability Insurance | Admitting: Physical Therapy

## 2020-03-22 ENCOUNTER — Other Ambulatory Visit: Payer: Medicaid Other

## 2020-03-23 ENCOUNTER — Ambulatory Visit: Payer: Medicaid Other | Attending: Oncology | Admitting: Physical Therapy

## 2020-03-23 ENCOUNTER — Other Ambulatory Visit: Payer: Self-pay

## 2020-03-23 ENCOUNTER — Encounter: Payer: Self-pay | Admitting: Physical Therapy

## 2020-03-23 DIAGNOSIS — M25611 Stiffness of right shoulder, not elsewhere classified: Secondary | ICD-10-CM | POA: Insufficient documentation

## 2020-03-23 DIAGNOSIS — M25511 Pain in right shoulder: Secondary | ICD-10-CM | POA: Diagnosis not present

## 2020-03-23 DIAGNOSIS — G8929 Other chronic pain: Secondary | ICD-10-CM | POA: Insufficient documentation

## 2020-03-23 LAB — COMPLETE METABOLIC PANEL WITH GFR
AG Ratio: 1.3 (calc) (ref 1.0–2.5)
ALT: 15 U/L (ref 6–29)
AST: 12 U/L (ref 10–35)
Albumin: 3.7 g/dL (ref 3.6–5.1)
Alkaline phosphatase (APISO): 101 U/L (ref 37–153)
BUN: 16 mg/dL (ref 7–25)
CO2: 25 mmol/L (ref 20–32)
Calcium: 9.5 mg/dL (ref 8.6–10.4)
Chloride: 102 mmol/L (ref 98–110)
Creat: 0.87 mg/dL (ref 0.50–1.05)
GFR, Est African American: 89 mL/min/{1.73_m2} (ref >=60)
GFR, Est Non African American: 77 mL/min/{1.73_m2} (ref >=60)
Globulin: 2.9 g/dL (calc) (ref 1.9–3.7)
Glucose, Bld: 177 mg/dL — ABNORMAL HIGH (ref 65–99)
Potassium: 4 mmol/L (ref 3.5–5.3)
Sodium: 138 mmol/L (ref 135–146)
Total Bilirubin: 0.3 mg/dL (ref 0.2–1.2)
Total Protein: 6.6 g/dL (ref 6.1–8.1)

## 2020-03-23 NOTE — Therapy (Signed)
[place] [facility] [address] [place], [address] Phone: [phone]   Fax:  [phone]  Physical Therapy Treatment  Patient Details  Name: Chelsea Hudson MRN: [id number] Date of Birth: 15-Jan-1966 No data recorded  Encounter Date: 03/23/2020  PT End of Session - 03/23/20 1615    Visit Number  8    Number of Visits  16    Date for PT Re-Evaluation  04/23/20    Authorization Time Period  12 visits 03/01/20 - 04/11/20    PT Start Time  0407    PT Stop Time  0445    PT Time Calculation (min)  38 min       Past Medical History:  Diagnosis Date  . Arthritis    SHOULDER  . Breast cancer ([place]) 02/2017   rt breast  . Cancer ([place]) 02/28/2017   INVASIVE MAMMARY CARCINOMA WITH MUCINOUS FEATURES.  . Colonic diverticular abscess 06/21/2017   Colonoscopy 06/25/2017: No evidence of malignancy.  . Diabetes mellitus without complication ([place])   . Hypertension   . Irregular heart beat    PT STATES IT "SKIPS A BEAT"   . Obesity   . Personal history of chemotherapy   . Personal history of radiation therapy     Past Surgical History:  Procedure Laterality Date  . ANKLE SURGERY    . BREAST BIOPSY Right 02/28/2017   INVASIVE MAMMARY CARCINOMA WITH MUCINOUS FEATURES.  Marland Kitchen BREAST CYST ASPIRATION Right    NEG  . COLONOSCOPY WITH PROPOFOL N/A 06/25/2017   Procedure: COLONOSCOPY WITH PROPOFOL;  Surgeon: Jonathon Bellows, MD;  Location: [facility] ENDOSCOPY;  Service: Gastroenterology;  Laterality: N/A;  . MASTECTOMY, PARTIAL Right 07/30/2017   Procedure: MASTECTOMY PARTIAL;  Surgeon: Robert Bellow, MD;  Location: [facility] ORS;  Service: General;  Laterality: Right;  . PORTACATH PLACEMENT Left 03/15/2017   Procedure: INSERTION PORT-A-CATH;  Surgeon: Robert Bellow, MD;  Location: [facility] ORS;  Service: General;  Laterality: Left;  . RE-EXCISION OF BREAST LUMPECTOMY Right 08/17/2017    INVASIVE CARCINOMA EXTENDS TO NEW LATERAL MARGIN.  /RE-EXCISION OF BREAST LUMPECTOMY;: Byrnett, Forest Gleason, MD;  [facility] ORS; General;  Laterality: Right;  . SENTINEL NODE BIOPSY Right 07/30/2017   Procedure: SENTINEL NODE BIOPSY;  Surgeon: Robert Bellow, MD;  Location: [facility] ORS;  Service: General;  Laterality: Right;  . SIMPLE MASTECTOMY WITH AXILLARY SENTINEL NODE BIOPSY Right 09/28/2017   Procedure: SIMPLE MASTECTOMY;  Surgeon: Robert Bellow, MD;  Location: [facility] ORS;  Service: General;  Laterality: Right;    There were no vitals filed for this visit.  Subjective Assessment - 03/23/20 1610    Subjective  Patient reports she has been moving more at home, but that her abd AAROM is still difficult. Denies pain, compliance with HEP.    Pertinent History  Pt is a 54 year old female s/p masectomy w/ lymphectomy in 2019, following chemo and radiation treatment for R breast cancer diagnosis. Has had lymphadema since beginning of 2020, and has seen improvement for this following OT treatment here since Dec 2020. She is being seen by PT today d/t progressive R shoulder weakness and decrease of motion over the past year d/t scar tissue, and lymphadema. 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 She reports difficulty with all overhead motion, lifting (reports she cannot lift a gallon of milk with RUE), and can only drive with hands at lower part of wheel. Reports occasional tension/dull  pain in the shoulder/neck pain that is random in nature, but usually comes on at the end of the day when she has used the UE often during the day. Worst pain over the past week: 7/10 best 0/10. Pt denies N/V, B&B changes, unexplained weight fluctuation, saddle paresthesia, fever, night sweats, or unrelenting night pain at this time    Limitations  Lifting;House hold activities    How long can you sit comfortably?  unlimited    How long can you stand comfortably?  81mins    How long can you walk comfortably?  82mins    Diagnostic tests  None    Patient  Stated Goals  To be able to use my RUE like I do my LUE    Pain Onset  More than a month ago          Ther-Ex Pulleys abd and flex 55mins each way with 5-10sec hold in max of each position each rep Y on the wall 2x 5 with minA for initiation with good carry over UE ranger walking AAROM flex x12; abd x12 with 3sec hold with good carry over of demo for desired technique Doorway pec stretch 2x 31min hold  Manual Scar massage x64mins; increased time spent on lateral part of scar at axilla Mobilization with movement into flex 4min, abd 22min and ER 65min AROM following Posterior mobilization G3 6bouts 30sec for increased mobility   PROM following Flex approx 170d Abd approx 160d ER approx 80d  IR 90d                    PT Education - 03/23/20 1613    Education Details  therex form    Person(s) Educated  Patient    Methods  Explanation;Demonstration;Verbal cues    Comprehension  Verbalized understanding;Returned demonstration;Verbal cues required       PT Short Term Goals - 02/23/20 1521      PT SHORT TERM GOAL #1   Title  Pt will be independent with HEP in order to improve strength and decrease pain in order to improve pain-free function at home and work.    Baseline  02/23/20 HEP given    Time  5    Period  Weeks    Status  New      PT SHORT TERM GOAL #2   Title  Patient will demonstrate full passive shoulder ROM in order to build full active motion needed for overhead tasks    Baseline  02/23/20 flex: 132d abd 102d    Time  5    Period  Weeks    Status  New        PT Long Term Goals - 02/23/20 1522      PT LONG TERM GOAL #1   Title  Patient will increase FOTO score to 59 to demonstrate predicted increase in functional mobility to complete ADLs    Baseline  02/23/20 38    Time  8    Period  Weeks    Status  New      PT LONG TERM GOAL #2   Title  Pt will increase gross periscapular strength to at least 4+/5 MMT grade in order to perform overhead  household tasks    Baseline  02/23/20 R/L Lat 4/4+  Y 2/4 T 3+/4 I 4/4+    Time  8    Period  Weeks    Status  New      PT LONG TERM GOAL #3  Title  Patient will demonstrate full active R shoulder abd and flex in order to complete overhead tasks    Baseline  02/23/20 flex: 110d abd 90d    Time  8    Period  Weeks    Status  New      PT LONG TERM GOAL #4   Title  Patient will be able to lift 8# DB onto a shelf at eye level to demonstrate ability to lift a gallon of milk to top shelf of a fridge    Baseline  02/23/20    Time  8    Period  Weeks    Status  New            Plan - 03/23/20 1638    Clinical Impression Statement  PT continued progression for increased motion this session with most passive and active gains seen yet following manual techniques. Patient is able to comply with all cuing for proper technique of therex with good motivation throughout session, without increased pain. PT will continue progression for AROM and strengthening next session    Personal Factors and Comorbidities  Age;Comorbidity 1;Comorbidity 2;Fitness;Sex;Profession    Comorbidities  HTN, breast cancer    Examination-Participation Restrictions  Laundry;Meal Prep;Yard Work;Community Activity;Driving    Stability/Clinical Decision Making  Evolving/Moderate complexity    Clinical Decision Making  Moderate    Rehab Potential  Good    PT Frequency  2x / week    PT Duration  8 weeks    PT Treatment/Interventions  ADLs/Self Care Home Management;Electrical Stimulation;Therapeutic activities;Patient/family education;Taping;Joint Manipulations;Spinal Manipulations;Passive range of motion;Dry needling;Manual techniques;Functional mobility training;Ultrasound;Cryotherapy;Traction;Neuromuscular re-education;Therapeutic exercise;DME Instruction;Moist Heat;Iontophoresis 4mg /ml Dexamethasone    PT Next Visit Plan  scar tissue massage    PT Home Exercise Plan  [id number]    Consulted and Agree with Plan of Care   Patient       Patient will benefit from skilled therapeutic intervention in order to improve the following deficits and impairments:  Increased fascial restricitons, Pain, Improper body mechanics, Decreased mobility, Decreased scar mobility, Impaired tone, Postural dysfunction, Decreased activity tolerance, Decreased endurance, Decreased range of motion, Decreased strength, Impaired UE functional use, Impaired flexibility, Increased edema  Visit Diagnosis: Chronic right shoulder pain  Stiffness of right shoulder, not elsewhere classified     Problem List Patient Active Problem List   Diagnosis Date Noted  . Well woman exam with routine gynecological exam 02/27/2020  . Hypokalemia 02/12/2020  . 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 Chemotherapy-induced neuropathy ([place]) 01/29/2019  . Pyuria 11/07/2018  . Urinary urgency 11/07/2018  . Hyperlipidemia associated with type 2 diabetes mellitus ([place]) 10/02/2018  . Neuropathy due to chemotherapeutic drug ([place]) 10/02/2018  . Myalgia due to statin 10/02/2018  . Controlled type 2 diabetes mellitus with hyperglycemia ([place]) 07/05/2018  . Diverticulosis of large intestine without diverticulitis 03/01/2018  . Personal history of chemotherapy 01/03/2018  . Malignant neoplasm of upper-outer quadrant of right breast in female, estrogen receptor positive ([place]) 03/05/2017  . Goals of care, counseling/discussion 03/05/2017  . Essential hypertension 01/05/2016   Shelton Silvas PT, DPT Shelton Silvas 03/23/2020, 4:42 PM  [facility] [address], [place], [address] Phone: [phone]   Fax:  [phone]  Name: Chelsea Hudson MRN: [id number] Date of Birth: 25-Aug-1966

## 2020-03-25 ENCOUNTER — Ambulatory Visit: Payer: Medicaid Other | Admitting: Physical Therapy

## 2020-03-25 ENCOUNTER — Encounter: Payer: Self-pay | Admitting: Physical Therapy

## 2020-03-25 ENCOUNTER — Other Ambulatory Visit: Payer: Self-pay

## 2020-03-25 DIAGNOSIS — G8929 Other chronic pain: Secondary | ICD-10-CM

## 2020-03-25 DIAGNOSIS — M25511 Pain in right shoulder: Secondary | ICD-10-CM | POA: Diagnosis not present

## 2020-03-25 DIAGNOSIS — M25611 Stiffness of right shoulder, not elsewhere classified: Secondary | ICD-10-CM

## 2020-03-25 NOTE — Therapy (Signed)
[facility] [address] [place], [address] Phone: [phone]   Fax:  [phone]  Physical Therapy Treatment  Patient Details  Name: Chelsea Hudson MRN: [id number] Date of Birth: [birth year] No data recorded  Encounter Date: 03/25/2020  PT End of Session - 03/25/20 1541    Visit Number  9    Number of Visits  16    Date for PT Re-Evaluation  04/23/20    Authorization Time Period  12 visits 03/01/20 - 04/11/20    Authorization - Visit Number  8    Authorization - Number of Visits  12    PT Start Time  0334    PT Stop Time  0413    PT Time Calculation (min)  39 min    Activity Tolerance  Patient tolerated treatment well    Behavior During Therapy  [facility] for tasks assessed/performed       Past Medical History:  Diagnosis Date  . Arthritis    SHOULDER  . Breast cancer ([place]) 02/2017   rt breast  . Cancer ([place]) 02/28/2017   INVASIVE MAMMARY CARCINOMA WITH MUCINOUS FEATURES.  . Colonic diverticular abscess 06/21/2017   Colonoscopy 06/25/2017: No evidence of malignancy.  . Diabetes mellitus without complication ([place])   . Hypertension   . Irregular heart beat    PT STATES IT "SKIPS A BEAT"   . Obesity   . Personal history of chemotherapy   . Personal history of radiation therapy     Past Surgical History:  Procedure Laterality Date  . ANKLE SURGERY    . BREAST BIOPSY Right 02/28/2017   INVASIVE MAMMARY CARCINOMA WITH MUCINOUS FEATURES.  Marland Kitchen BREAST CYST ASPIRATION Right    NEG  . COLONOSCOPY WITH PROPOFOL N/A 06/25/2017   Procedure: COLONOSCOPY WITH PROPOFOL;  Surgeon: Jonathon Bellows, MD;  Location[facility] END[facility]COPY;  Service: Gastroenterology;  Laterality: N/A;  . MASTECTOMY, PARTIAL Right 07/30/2017   Procedure: MASTECTOMY PARTIAL;  Surgeon: Robert Bellow, MD;  Location: [facility] ORS;  Service: General;  Laterality: Right;  . PORTACATH PLACEMENT Left 03/15/2017   Procedure: INSERTION PORT-A-CATH;  Surgeon:  Robert Bellow, MD;  Location: [facility] ORS;  Service: General;  Laterality: Left;  . RE-EXCISION OF BREAST LUMPECTOMY Right 08/17/2017    INVASIVE CARCINOMA EXTENDS TO NEW LATERAL MARGIN. /RE-EXCISION OF BREAST LUMPECTOMY;: Byrnett, Forest Gleason, MD;  [facility] ORS; General;  Laterality: Right;  . SENTINEL NODE BIOPSY Right 07/30/2017   Procedure: SENTINEL NODE BIOPSY;  Surgeon: Robert Bellow, MD;  Location: [facility] ORS;  Service: General;  Laterality: Right;  . SIMPLE MASTECTOMY WITH AXILLARY SENTINEL NODE BIOPSY Right 09/28/2017   Procedure: SIMPLE MASTECTOMY;  Surgeon: Robert Bellow, MD;  Location: [facility] ORS;  Service: General;  Laterality: Right;    There were no vitals filed for this visit.  Subjective Assessment - 03/25/20 1537    Subjective  Reports she feels a little stiff today, no pain. HEP compliance    Pertinent History  Pt is a 54 year old female s/p masectomy w/ lymphectomy in 2019, following chemo and radiation treatment for R breast cancer diagnosis. Has had lymphadema since beginning of 2020, and has seen improvement for this following OT treatment here since Dec 2020. She is being seen by PT today d/t progressive R shoulder weakness and decrease of motion over the past year d/t scar tissue, and lymphadema. 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 She reports difficulty  with all overhead motion, lifting (reports she cannot lift a gallon of milk with RUE), and can only drive with hands at lower part of wheel. Reports occasional tension/dull pain in the shoulder/neck pain that is random in nature, but usually comes on at the end of the day when she has used the UE often during the day. Worst pain over the past week: 7/10 best 0/10. Pt denies N/V, B&B changes, unexplained weight fluctuation, saddle paresthesia, fever, night sweats, or unrelenting night pain at this time    Limitations  Lifting;House hold activities    How long can you sit comfortably?  unlimited    How  long can you stand comfortably?  69mins    How long can you walk comfortably?  4mins    Diagnostic tests  None    Patient Stated Goals  To be able to use my RUE like I do my LUE    Pain Onset  More than a month ago       Ther-Ex Pulleys abd 6mins flex 66mins with good motion with this; near full AAROM flex and abd with AROM flex: 135d abd 92d UE ranger clockwise x10 counterclockwise x10 with cuing for full ROM  Y on wall 3x 6/8/8 with min cuing for technique initially with elbow ext with good carry over following Bent over flys 2# DB 3x 10/9/8 with cuing for scapular retraction and eccentric control with good carry over Overhead press to behind head PVC pipe 2x 10 (patient able to obtain touch down to occiput Standing ER at wall with back against wall 3x 10/9/8 with min cuing to maintain elbow height High rows 10# x10; 15# 2x 10 with min cuing for elbow height Doorway pec stretch 32min                         PT Education - 03/25/20 1540    Education Details  therex form/technique    Person(s) Educated  Patient    Methods  Explanation;Demonstration;Verbal cues    Comprehension  Verbalized understanding;Returned demonstration;Verbal cues required       PT Short Term Goals - 02/23/20 1521      PT SHORT TERM GOAL #1   Title  Pt will be independent with HEP in order to improve strength and decrease pain in order to improve pain-free function at home and work.    Baseline  02/23/20 HEP given    Time  5    Period  Weeks    Status  New      PT SHORT TERM GOAL #2   Title  Patient will demonstrate full passive shoulder ROM in order to build full active motion needed for overhead tasks    Baseline  02/23/20 flex: 132d abd 102d    Time  5    Period  Weeks    Status  New        PT Long Term Goals - 02/23/20 1522      PT LONG TERM GOAL #1   Title  Patient will increase FOTO score to 59 to demonstrate predicted increase in functional mobility to complete ADLs     Baseline  02/23/20 38    Time  8    Period  Weeks    Status  New      PT LONG TERM GOAL #2   Title  Pt will increase gross periscapular strength to at least 4+/5 MMT grade in order to perform overhead household tasks  Baseline  02/23/20 R/L Lat 4/4+  Y 2/4 T 3+/4 I 4/4+    Time  8    Period  Weeks    Status  New      PT LONG TERM GOAL #3   Title  Patient will demonstrate full active R shoulder abd and flex in order to complete overhead tasks    Baseline  02/23/20 flex: 110d abd 90d    Time  8    Period  Weeks    Status  New      PT LONG TERM GOAL #4   Title  Patient will be able to lift 8# DB onto a shelf at eye level to demonstrate ability to lift a gallon of milk to top shelf of a fridge    Baseline  02/23/20    Time  8    Period  Weeks    Status  New            Plan - 03/25/20 1558    Clinical Impression Statement  PT continued therex progression for increased mobility and strength with good success. Patient is able to complete all therex with proper technique following cuing. Patient with fatigue, but good motivation without increased pain. Patient is steadily improving overhead motion, but continues to lack full AROM. PT will continue progression as able.    Personal Factors and Comorbidities  Age;Comorbidity 1;Comorbidity 2;Fitness;Sex;Profession    Comorbidities  HTN, breast cancer    Examination-Participation Restrictions  Laundry;Meal Prep;Yard Work;Community Activity;Driving    Stability/Clinical Decision Making  Evolving/Moderate complexity    Clinical Decision Making  Moderate    Rehab Potential  Good    PT Frequency  2x / week    PT Duration  8 weeks    PT Treatment/Interventions  ADLs/Self Care Home Management;Electrical Stimulation;Therapeutic activities;Patient/family education;Taping;Joint Manipulations;Spinal Manipulations;Passive range of motion;Dry needling;Manual techniques;Functional mobility training;Ultrasound;Cryotherapy;Traction;Neuromuscular  re-education;Therapeutic exercise;DME Instruction;Moist Heat;Iontophoresis 4mg /ml Dexamethasone    PT Next Visit Plan  scar tissue massage    PT Home Exercise Plan  [id number]    Consulted and Agree with Plan of Care  Patient       Patient will benefit from skilled therapeutic intervention in order to improve the following deficits and impairments:  Increased fascial restricitons, Pain, Improper body mechanics, Decreased mobility, Decreased scar mobility, Impaired tone, Postural dysfunction, Decreased activity tolerance, Decreased endurance, Decreased range of motion, Decreased strength, Impaired UE functional use, Impaired flexibility, Increased edema  Visit Diagnosis: Chronic right shoulder pain  Stiffness of right shoulder, not elsewhere classified     Problem List Patient Active Problem List   Diagnosis Date Noted  . Well woman exam with routine gynecological exam 02/27/2020  . Hypokalemia 02/12/2020  . 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 Chemotherapy-induced neuropathy ([place]) 01/29/2019  . Pyuria 11/07/2018  . Urinary urgency 11/07/2018  . Hyperlipidemia associated with type 2 diabetes mellitus ([place]) 10/02/2018  . Neuropathy due to chemotherapeutic drug ([place]) 10/02/2018  . Myalgia due to statin 10/02/2018  . Controlled type 2 diabetes mellitus with hyperglycemia ([place]) 07/05/2018  . Diverticulosis of large intestine without diverticulitis 03/01/2018  . Personal history of chemotherapy 01/03/2018  . Malignant neoplasm of upper-outer quadrant of right breast in female, estrogen receptor positive ([place]) 03/05/2017  . Goals of care, counseling/discussion 03/05/2017  . Essential hypertension 01/05/2016   Shelton Silvas PT, DPT Shelton Silvas 03/25/2020, 4:13 PM  [facility] [address], [place], [address] Phone: [phone]   Fax:  [phone]  Name: Chelsea Hudson MRN: [id number] Date of Birth:  March 14, 1966

## 2020-03-29 ENCOUNTER — Encounter: Payer: Disability Insurance | Admitting: Physical Therapy

## 2020-03-30 ENCOUNTER — Other Ambulatory Visit: Payer: Self-pay

## 2020-03-30 ENCOUNTER — Other Ambulatory Visit (HOSPITAL_COMMUNITY)
Admission: RE | Admit: 2020-03-30 | Discharge: 2020-03-30 | Disposition: A | Discharge status: Home / Self Care | Payer: Medicaid Other | Source: Ambulatory Visit | Attending: Obstetrics and Gynecology | Admitting: Obstetrics and Gynecology

## 2020-03-30 ENCOUNTER — Ambulatory Visit: Payer: Medicaid Other | Admitting: Physical Therapy

## 2020-03-30 ENCOUNTER — Encounter: Payer: Self-pay | Admitting: Physical Therapy

## 2020-03-30 ENCOUNTER — Ambulatory Visit (INDEPENDENT_AMBULATORY_CARE_PROVIDER_SITE_OTHER): Payer: Medicaid Other | Admitting: Obstetrics and Gynecology

## 2020-03-30 ENCOUNTER — Encounter: Payer: Self-pay | Admitting: Obstetrics and Gynecology

## 2020-03-30 VITALS — BP 122/80 | Ht 66.0 in | Wt 251.0 lb

## 2020-03-30 DIAGNOSIS — G8929 Other chronic pain: Secondary | ICD-10-CM

## 2020-03-30 DIAGNOSIS — R87810 Cervical high risk human papillomavirus (HPV) DNA test positive: Secondary | ICD-10-CM | POA: Insufficient documentation

## 2020-03-30 DIAGNOSIS — M25611 Stiffness of right shoulder, not elsewhere classified: Secondary | ICD-10-CM

## 2020-03-30 DIAGNOSIS — M25511 Pain in right shoulder: Secondary | ICD-10-CM | POA: Diagnosis not present

## 2020-03-30 NOTE — Progress Notes (Signed)
   GYNECOLOGY CLINIC COLPOSCOPY PROCEDURE NOTE  54 y.o. G0P0 here for colposcopy for NIL and HR HPV+  pap smear on 02/27/2020. Discussed underlying role for HPV infection in the development of cervical dysplasia, its natural history and progression/regression, need for surveillance.  Is the patient  pregnant: No LMP: Patient's last menstrual period was 01/31/2014 (approximate). Smoking status:  reports that she quit smoking about 2 years ago. Her smoking use included cigarettes. She has a 6.25 pack-year smoking history. She has quit using smokeless tobacco. Contraception: none Future fertility desired:  No  Patient given informed consent, signed copy in the chart, time out was performed.  The patient was position in dorsal lithotomy position. Speculum was placed the cervix was visualized.   After application of acetic acid colposcopic inspection of the cervix was undertaken.   Colposcopy adequate, full visualization of transformation zone: Yes acetowhite lesion(s) noted at 12 o'clock; corresponding biopsies obtained.   ECC specimen obtained:  Yes  All specimens were labeled and sent to pathology.   Patient was given post procedure instructions.  Will follow up pathology and manage accordingly.  Routine preventative health maintenance measures emphasized.  Physical Exam Genitourinary:        Adrian Prows MD [facility] 03/30/2020 3:05 PM

## 2020-03-30 NOTE — Patient Instructions (Signed)
Colposcopy, Care After This sheet gives you information about how to care for yourself after your procedure. Your doctor may also give you more specific instructions. If you have problems or questions, contact your doctor. What can I expect after the procedure? If you did not have a tissue sample removed (did not have a biopsy), you may only have some spotting for a few days. You can go back to your normal activities. If you had a tissue sample removed, it is common to have:  Soreness and pain. This may last for a few days.  Light-headedness.  Mild bleeding from your vagina or dark-colored, grainy discharge from your vagina. This may last for a few days. You may need to wear a sanitary pad.  Spotting for at least 48 hours after the procedure. Follow these instructions at home:   Take over-the-counter and prescription medicines only as told by your doctor. Ask your doctor what medicines you can start taking again. This is very important if you take blood-thinning medicine.  Do not drive or use heavy machinery while taking prescription pain medicine.  For 3 days, or as long as your doctor tells you, avoid: ? Douching. ? Using tampons. ? Having sex.  If you use birth control (contraception), keep using it.  Limit activity for the first day after the procedure. Ask your doctor what activities are safe for you.  It is up to you to get the results of your procedure. Ask your doctor when your results will be ready.  Keep all follow-up visits as told by your doctor. This is important. Contact a doctor if:  You get a skin rash. Get help right away if:  You are bleeding a lot from your vagina. It is a lot of bleeding if you are using more than one pad an hour for 2 hours in a row.  You have clumps of blood (blood clots) coming from your vagina.  You have a fever.  You have chills  You have pain in your lower belly (pelvic area).  You have signs of infection, such as vaginal  discharge that is: ? Different than usual. ? Yellow. ? Bad-smelling.  You have very pain or cramps in your lower belly that do not get better with medicine.  You feel light-headed.  You feel dizzy.  You pass out (faint). Summary  If you did not have a tissue sample removed (did not have a biopsy), you may only have some spotting for a few days. You can go back to your normal activities.  If you had a tissue sample removed, it is common to have mild pain and spotting for 48 hours.  For 3 days, or as long as your doctor tells you, avoid douching, using tampons and having sex.  Get help right away if you have bleeding, very bad pain, or signs of infection. This information is not intended to replace advice given to you by your health care provider. Make sure you discuss any questions you have with your health care provider. Document Revised: 10/19/2017 Document Reviewed: 07/26/2016 Elsevier Patient Education  2020 Elsevier Inc.  

## 2020-03-30 NOTE — Therapy (Signed)
[facility] [address], [place], [address] Phone: [phone]   Fax:  [phone]  Physical Therapy Treatment  Patient Details  Name: Chelsea Hudson MRN: [id number] Date of Birth: 07-02-1966 No data recorded  Encounter Date: 03/30/2020  PT End of Session - 03/30/20 1725    Visit Number  10    Number of Visits  16    Date for PT Re-Evaluation  04/23/20    Authorization Time Period  12 visits 03/01/20 - 04/11/20    Authorization - Visit Number  9    Authorization - Number of Visits  12    PT Start Time  0500    PT Stop Time  0540    PT Time Calculation (min)  40 min    Activity Tolerance  Patient tolerated treatment well    Behavior During Therapy  [facility] for tasks assessed/performed       Past Medical History:  Diagnosis Date  . Arthritis    SHOULDER  . Breast cancer ([place]) 02/2017   rt breast  . Cancer ([place]) 02/28/2017   INVASIVE MAMMARY CARCINOMA WITH MUCINOUS FEATURES.  . Colonic diverticular abscess 06/21/2017   Colonoscopy 06/25/2017: No evidence of malignancy.  . Diabetes mellitus without complication ([place])   . Hypertension   . Irregular heart beat    PT STATES IT "SKIPS A BEAT"   . Obesity   . Personal history of chemotherapy   . Personal history of radiation therapy     Past Surgical History:  Procedure Laterality Date  . ANKLE SURGERY    . BREAST BIOPSY Right 02/28/2017   INVASIVE MAMMARY CARCINOMA WITH MUCINOUS FEATURES.  Marland Kitchen BREAST CYST ASPIRATION Right    NEG  . COLONOSCOPY WITH PROPOFOL N/A 06/25/2017   Procedure: COLONOSCOPY WITH PROPOFOL;  Surgeon: Jonathon Bellows, MD;  Location: [facility] ENDOSCOPY;  Service: Gastroenterology;  Laterality: N/A;  . MASTECTOMY, PARTIAL Right 07/30/2017   Procedure: MASTECTOMY PARTIAL;  Surgeon: Robert Bellow, MD;  Location: [facility] ORS;  Service: General;  Laterality: Right;  . PORTACATH PLACEMENT Left 03/15/2017   Procedure: INSERTION PORT-A-CATH;   Surgeon: Robert Bellow, MD;  Location: [facility] ORS;  Service: General;  Laterality: Left;  . RE-EXCISION OF BREAST LUMPECTOMY Right 08/17/2017    INVASIVE CARCINOMA EXTENDS TO NEW LATERAL MARGIN. /RE-EXCISION OF BREAST LUMPECTOMY;: Byrnett, Forest Gleason, MD;  [facility] ORS; General;  Laterality: Right;  . SENTINEL NODE BIOPSY Right 07/30/2017   Procedure: SENTINEL NODE BIOPSY;  Surgeon: Robert Bellow, MD;  Location: [facility] ORS;  Service: General;  Laterality: Right;  . SIMPLE MASTECTOMY WITH AXILLARY SENTINEL NODE BIOPSY Right 09/28/2017   Procedure: SIMPLE MASTECTOMY;  Surgeon: Robert Bellow, MD;  Location: [facility] ORS;  Service: General;  Laterality: Right;    There were no vitals filed for this visit.  Subjective Assessment - 03/30/20 1703    Subjective  Patient reports some soreness today, minimal pain. Though she is tired from having a biopsy at the [facility] before this appt. Compliance with HEP.    Pertinent History  Pt is a 54 year old female s/p masectomy w/ lymphectomy in 2019, following chemo and radiation treatment for R breast cancer diagnosis. Has had lymphadema since beginning of 2020, and has seen improvement for this following OT treatment here since Dec 2020. She is being seen by PT today d/t progressive R shoulder weakness and decrease of motion over the past year d/t scar tissue, and lymphadema. 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 She reports difficulty with all overhead motion, lifting (reports she cannot lift a gallon of milk with RUE), and can only drive with hands at lower part of wheel. Reports occasional tension/dull pain in the shoulder/neck pain that is random in nature, but usually comes on at the end of the day when she has used the UE often during the day. Worst pain over the past week: 7/10 best 0/10. Pt denies N/V, B&B changes, unexplained weight fluctuation, saddle paresthesia, fever, night sweats, or unrelenting night pain at this time    Limitations   Lifting;House hold activities    How long can you sit comfortably?  unlimited    How long can you stand comfortably?  23mins    How long can you walk comfortably?  25mins    Diagnostic tests  None    Patient Stated Goals  To be able to use my RUE like I do my LUE    Pain Onset  More than a month ago         Ther-Ex Pulleys abd and flex 55mins each way with 5-10sec hold in max of each position each rep Attempted prone Y x5 AAROM Y on door 2x 8 with minimal lift but no assistance Overhead carry 2# DB 4x 19ft with demo and max cuing for setup with good carry over following High row 10# 2x 10 with good carry over of technique Overhead doorway pec stretch 2x 35min hold  Manual Scar massage x56mins; increased time spent on lateral part of scar at axilla Mobilization with movement into flex 19min, abd 48min and ER 21min AROM following Posterior mobilization G3 6bouts 30sec for increased mobility   PROM following Flex approx 170d Abd approx 160d ER approx 80d  IR 90d  AROM  Flex 133d Abd 117 IR T5 ER C8                     PT Education - 03/30/20 1707    Education Details  therex form/technique    Person(s) Educated  Patient    Methods  Explanation;Demonstration;Verbal cues    Comprehension  Verbalized understanding;Returned demonstration;Verbal cues required       PT Short Term Goals - 02/23/20 1521      PT SHORT TERM GOAL #1   Title  Pt will be independent with HEP in order to improve strength and decrease pain in order to improve pain-free function at home and work.    Baseline  02/23/20 HEP given    Time  5    Period  Weeks    Status  New      PT SHORT TERM GOAL #2   Title  Patient will demonstrate full passive shoulder ROM in order to build full active motion needed for overhead tasks    Baseline  02/23/20 flex: 132d abd 102d    Time  5    Period  Weeks    Status  New        PT Long Term Goals - 02/23/20 1522      PT LONG TERM GOAL #1    Title  Patient will increase FOTO score to 59 to demonstrate predicted increase in functional mobility to complete ADLs    Baseline  02/23/20 38    Time  8    Period  Weeks    Status  New      PT LONG TERM GOAL #2   Title  Pt will increase gross periscapular strength to at least 4+/5 MMT grade in order to perform overhead household tasks    Baseline  02/23/20 R/L Lat 4/4+  Y 2/4 T 3+/4 I 4/4+    Time  8    Period  Weeks    Status  New      PT LONG TERM GOAL #3   Title  Patient will demonstrate full active R shoulder abd and flex in order to complete overhead tasks    Baseline  02/23/20 flex: 110d abd 90d    Time  8    Period  Weeks    Status  New      PT LONG TERM GOAL #4   Title  Patient will be able to lift 8# DB onto a shelf at eye level to demonstrate ability to lift a gallon of milk to top shelf of a fridge    Baseline  02/23/20    Time  8    Period  Weeks    Status  New              Patient will benefit from skilled therapeutic intervention in order to improve the following deficits and impairments:     Visit Diagnosis: Chronic right shoulder pain  Stiffness of right shoulder, not elsewhere classified     Problem List Patient Active Problem List   Diagnosis Date Noted  . Well woman exam with routine gynecological exam 02/27/2020  . Intermittent tingling sensation of right hand and foot 02/26/2020  . Numbness and tingling in right hand 02/26/2020  . Hypokalemia 02/12/2020  . 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 Chemotherapy-induced neuropathy ([place]) 01/29/2019  . Pyuria 11/07/2018  . Urinary urgency 11/07/2018  . Hyperlipidemia associated with type 2 diabetes mellitus ([place]) 10/02/2018  . Neuropathy due to chemotherapeutic drug ([place]) 10/02/2018  . Myalgia due to statin 10/02/2018  . Controlled type 2 diabetes mellitus with hyperglycemia ([place]) 07/05/2018  . Diverticulosis of large intestine without diverticulitis 03/01/2018  . Personal history of chemotherapy  01/03/2018  . Peripheral neuropathy due to chemotherapy ([place]) 07/04/2017  . Malignant neoplasm of upper-outer quadrant of right breast in female, estrogen receptor positive ([place]) 03/05/2017  . Goals of care, counseling/discussion 03/05/2017  . Essential hypertension 01/05/2016   Shelton Silvas PT, DPT Shelton Silvas 03/30/2020, 5:45 PM  [facility] [address], [place], [address] Phone: [phone]   Fax:  [phone]  Name: Chelsea Hudson MRN: [id number] Date of Birth: 21-Jun-1966

## 2020-03-31 ENCOUNTER — Ambulatory Visit: Payer: Medicaid Other | Admitting: Physical Therapy

## 2020-04-01 ENCOUNTER — Encounter: Payer: Self-pay | Admitting: Physical Therapy

## 2020-04-01 ENCOUNTER — Other Ambulatory Visit: Payer: Self-pay

## 2020-04-01 ENCOUNTER — Ambulatory Visit: Payer: Medicaid Other | Admitting: Physical Therapy

## 2020-04-01 DIAGNOSIS — M25511 Pain in right shoulder: Secondary | ICD-10-CM | POA: Diagnosis not present

## 2020-04-01 DIAGNOSIS — G8929 Other chronic pain: Secondary | ICD-10-CM

## 2020-04-01 DIAGNOSIS — M25611 Stiffness of right shoulder, not elsewhere classified: Secondary | ICD-10-CM

## 2020-04-01 LAB — SURGICAL PATHOLOGY

## 2020-04-01 NOTE — Therapy (Signed)
[facility] [address], [place], [address] Phone: [phone]   Fax:  [phone]  Physical Therapy Treatment  Patient Details  Name: Chelsea Hudson MRN: [id number] Date of Birth: 07/04/1966 No data recorded  Encounter Date: 04/01/2020  PT End of Session - 04/01/20 1727    Visit Number  11    Number of Visits  16    Date for PT Re-Evaluation  04/23/20    Authorization Time Period  12 visits 03/01/20 - 04/11/20    Authorization - Visit Number  10    Authorization - Number of Visits  12    PT Start Time  0525    PT Stop Time  0603    PT Time Calculation (min)  38 min    Activity Tolerance  Patient tolerated treatment well    Behavior During Therapy  [facility] for tasks assessed/performed       Past Medical History:  Diagnosis Date  . Arthritis    SHOULDER  . Breast cancer ([place]) 02/2017   rt breast  . Cancer ([place]) 02/28/2017   INVASIVE MAMMARY CARCINOMA WITH MUCINOUS FEATURES.  . Colonic diverticular abscess 06/21/2017   Colonoscopy 06/25/2017: No evidence of malignancy.  . Diabetes mellitus without complication ([place])   . Hypertension   . Irregular heart beat    PT STATES IT "SKIPS A BEAT"   . Obesity   . Personal history of chemotherapy   . Personal history of radiation therapy     Past Surgical History:  Procedure Laterality Date  . ANKLE SURGERY    . BREAST BIOPSY Right 02/28/2017   INVASIVE MAMMARY CARCINOMA WITH MUCINOUS FEATURES.  Marland Kitchen BREAST CYST ASPIRATION Right    NEG  . COLONOSCOPY WITH PROPOFOL N/A 06/25/2017   Procedure: COLONOSCOPY WITH PROPOFOL;  Surgeon: Jonathon Bellows, MD;  Location: [facility] ENDOSCOPY;  Service: Gastroenterology;  Laterality: N/A;  . MASTECTOMY, PARTIAL Right 07/30/2017   Procedure: MASTECTOMY PARTIAL;  Surgeon: Robert Bellow, MD;  Location: [facility] ORS;  Service: General;  Laterality: Right;  . PORTACATH PLACEMENT Left 03/15/2017   Procedure: INSERTION PORT-A-CATH;   Surgeon: Robert Bellow, MD;  Location: [facility] ORS;  Service: General;  Laterality: Left;  . RE-EXCISION OF BREAST LUMPECTOMY Right 08/17/2017    INVASIVE CARCINOMA EXTENDS TO NEW LATERAL MARGIN. /RE-EXCISION OF BREAST LUMPECTOMY;: Byrnett, Forest Gleason, MD;  [facility] ORS; General;  Laterality: Right;  . SENTINEL NODE BIOPSY Right 07/30/2017   Procedure: SENTINEL NODE BIOPSY;  Surgeon: Robert Bellow, MD;  Location: [facility] ORS;  Service: General;  Laterality: Right;  . SIMPLE MASTECTOMY WITH AXILLARY SENTINEL NODE BIOPSY Right 09/28/2017   Procedure: SIMPLE MASTECTOMY;  Surgeon: Robert Bellow, MD;  Location: [facility] ORS;  Service: General;  Laterality: Right;    There were no vitals filed for this visit.  Subjective Assessment - 04/01/20 1727    Subjective  Patient reports no pain today, feeling good overall. Patient reports compliance with HEP.    Pertinent History  Pt is a 54 year old female s/p masectomy w/ lymphectomy in 2019, following chemo and radiation treatment for R breast cancer diagnosis. Has had lymphadema since beginning of 2020, and has seen improvement for this following OT treatment here since Dec 2020. She is being seen by PT today d/t progressive R shoulder weakness and decrease of motion over the past year d/t scar tissue, and lymphadema. 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 She  reports difficulty with all overhead motion, lifting (reports she cannot lift a gallon of milk with RUE), and can only drive with hands at lower part of wheel. Reports occasional tension/dull pain in the shoulder/neck pain that is random in nature, but usually comes on at the end of the day when she has used the UE often during the day. Worst pain over the past week: 7/10 best 0/10. Pt denies N/V, B&B changes, unexplained weight fluctuation, saddle paresthesia, fever, night sweats, or unrelenting night pain at this time    Limitations  Lifting;House hold activities    How long can you  sit comfortably?  unlimited    How long can you stand comfortably?  39mins    How long can you walk comfortably?  97mins    Diagnostic tests  None    Patient Stated Goals  To be able to use my RUE like I do my LUE       Ther-Ex Pulleys abd 10mins flex 67mins with good motion with this; near full AAROM flex and abd with AROM flex: 135d abd 92d UE ranger clockwise x10 counterclockwise x10 with cuing for full ROM  Y on wall 3x 6/8/8 with min cuing for technique initially with elbow ext with good carry over following Overhead carry 2# DB 4x 34ft with cuing initially for set up technique with good carry over following Seated overhead press 3# DB 3x 5/6/7 with cuing initially for scapular retraction with good carry over Standing scaption 2# DB 3x 6 with heavy cuing for technique through first set, good carry over following Overhead press to behind head PVC pipe 2x 10 (patient able to obtain touch down to occiput Doorway pec stretch 62min                          PT Education - 04/01/20 1726    Education Details  therex form/technique    Person(s) Educated  Patient    Methods  Explanation;Demonstration;Verbal cues    Comprehension  Verbalized understanding;Returned demonstration;Verbal cues required       PT Short Term Goals - 02/23/20 1521      PT SHORT TERM GOAL #1   Title  Pt will be independent with HEP in order to improve strength and decrease pain in order to improve pain-free function at home and work.    Baseline  02/23/20 HEP given    Time  5    Period  Weeks    Status  New      PT SHORT TERM GOAL #2   Title  Patient will demonstrate full passive shoulder ROM in order to build full active motion needed for overhead tasks    Baseline  02/23/20 flex: 132d abd 102d    Time  5    Period  Weeks    Status  New        PT Long Term Goals - 02/23/20 1522      PT LONG TERM GOAL #1   Title  Patient will increase FOTO score to 59 to demonstrate predicted increase  in functional mobility to complete ADLs    Baseline  02/23/20 38    Time  8    Period  Weeks    Status  New      PT LONG TERM GOAL #2   Title  Pt will increase gross periscapular strength to at least 4+/5 MMT grade in order to perform overhead household tasks    Baseline  02/23/20 R/L Lat 4/4+  Y 2/4 T 3+/4 I 4/4+    Time  8    Period  Weeks    Status  New      PT LONG TERM GOAL #3   Title  Patient will demonstrate full active R shoulder abd and flex in order to complete overhead tasks    Baseline  02/23/20 flex: 110d abd 90d    Time  8    Period  Weeks    Status  New      PT LONG TERM GOAL #4   Title  Patient will be able to lift 8# DB onto a shelf at eye level to demonstrate ability to lift a gallon of milk to top shelf of a fridge    Baseline  02/23/20    Time  8    Period  Weeks    Status  New            Plan - 04/01/20 1733    Clinical Impression Statement  PT continued progression for increased overhead mobility and strengthening with success. Patient is able to comply with all cuing for proper technique of therex with good motivation throughout session. PT will continue progression as able.    Personal Factors and Comorbidities  Age;Comorbidity 1;Comorbidity 2;Fitness;Sex;Profession    Comorbidities  HTN, breast cancer    Examination-Participation Restrictions  Laundry;Meal Prep;Yard Work;Community Activity;Driving    Stability/Clinical Decision Making  Evolving/Moderate complexity    Clinical Decision Making  Moderate    Rehab Potential  Good    PT Frequency  2x / week    PT Duration  8 weeks    PT Treatment/Interventions  ADLs/Self Care Home Management;Electrical Stimulation;Therapeutic activities;Patient/family education;Taping;Joint Manipulations;Spinal Manipulations;Passive range of motion;Dry needling;Manual techniques;Functional mobility training;Ultrasound;Cryotherapy;Traction;Neuromuscular re-education;Therapeutic exercise;DME Instruction;Moist Heat;Iontophoresis  4mg /ml Dexamethasone    PT Next Visit Plan  scar tissue massage    PT Home Exercise Plan  [id number]    Consulted and Agree with Plan of Care  Patient       Patient will benefit from skilled therapeutic intervention in order to improve the following deficits and impairments:  Increased fascial restricitons, Pain, Improper body mechanics, Decreased mobility, Decreased scar mobility, Impaired tone, Postural dysfunction, Decreased activity tolerance, Decreased endurance, Decreased range of motion, Decreased strength, Impaired UE functional use, Impaired flexibility, Increased edema  Visit Diagnosis: Chronic right shoulder pain  Stiffness of right shoulder, not elsewhere classified     Problem List Patient Active Problem List   Diagnosis Date Noted  . Well woman exam with routine gynecological exam 02/27/2020  . Intermittent tingling sensation of right hand and foot 02/26/2020  . Numbness and tingling in right hand 02/26/2020  . Hypokalemia 02/12/2020  . 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 Chemotherapy-induced neuropathy ([place]) 01/29/2019  . Pyuria 11/07/2018  . Urinary urgency 11/07/2018  . Hyperlipidemia associated with type 2 diabetes mellitus ([place]) 10/02/2018  . Neuropathy due to chemotherapeutic drug ([place]) 10/02/2018  . Myalgia due to statin 10/02/2018  . Controlled type 2 diabetes mellitus with hyperglycemia ([place]) 07/05/2018  . Diverticulosis of large intestine without diverticulitis 03/01/2018  . Personal history of chemotherapy 01/03/2018  . Peripheral neuropathy due to chemotherapy ([place]) 07/04/2017  . Malignant neoplasm of upper-outer quadrant of right breast in female, estrogen receptor positive ([place]) 03/05/2017  . Goals of care, counseling/discussion 03/05/2017  . Essential hypertension 01/05/2016   Shelton Silvas PT, DPT Shelton Silvas 04/01/2020, 5:58 PM  [place] [facility] [address] [place],  [address] Phone: [phone]   Fax:  [phone]  Name: Chelsea Hudson MRN: [id number] Date of Birth: 01-Sep-1966

## 2020-04-01 NOTE — Progress Notes (Signed)
Called and discussed with patient

## 2020-04-06 ENCOUNTER — Ambulatory Visit: Payer: Medicaid Other | Admitting: Physical Therapy

## 2020-04-08 ENCOUNTER — Encounter: Payer: Self-pay | Admitting: Physical Therapy

## 2020-04-08 ENCOUNTER — Ambulatory Visit: Payer: Medicaid Other | Admitting: Physical Therapy

## 2020-04-08 ENCOUNTER — Other Ambulatory Visit: Payer: Self-pay

## 2020-04-08 DIAGNOSIS — M25511 Pain in right shoulder: Secondary | ICD-10-CM | POA: Diagnosis not present

## 2020-04-08 DIAGNOSIS — M25611 Stiffness of right shoulder, not elsewhere classified: Secondary | ICD-10-CM

## 2020-04-08 NOTE — Therapy (Signed)
[facility] [address] [place], [address] Phone: [phone]   Fax:  [phone]  Physical Therapy Treatment  Patient Details  Name: Chelsea Hudson MRN: [id number] Date of Birth: [birth year] No data recorded  Encounter Date: 04/08/2020  PT End of Session - 04/08/20 1742    Visit Number  12    Number of Visits  16    Date for PT Re-Evaluation  04/23/20    Authorization Time Period  12 visits 03/01/20 - 04/11/20    Authorization - Visit Number  11    Authorization - Number of Visits  12    PT Start Time  0605    PT Stop Time  0625    PT Time Calculation (min)  20 min    Activity Tolerance  Patient tolerated treatment well    Behavior During Therapy  [facility] for tasks assessed/performed       Past Medical History:  Diagnosis Date  . Arthritis    SHOULDER  . Breast cancer ([place]) 02/2017   rt breast  . Cancer ([place]) 02/28/2017   INVASIVE MAMMARY CARCINOMA WITH MUCINOUS FEATURES.  . Colonic diverticular abscess 06/21/2017   Colonoscopy 06/25/2017: No evidence of malignancy.  . Diabetes mellitus without complication ([place])   . Hypertension   . Irregular heart beat    PT STATES IT "SKIPS A BEAT"   . Obesity   . Personal history of chemotherapy   . Personal history of radiation therapy     Past Surgical History:  Procedure Laterality Date  . ANKLE SURGERY    . BREAST BIOPSY Right 02/28/2017   INVASIVE MAMMARY CARCINOMA WITH MUCINOUS FEATURES.  Marland Kitchen BREAST CYST ASPIRATION Right    NEG  . COLONOSCOPY WITH PROPOFOL N/A 06/25/2017   Procedure: COLONOSCOPY WITH PROPOFOL;  Surgeon: Jonathon Bellows, MD;  Location: [facility] ENDOSCOPY;  Service: Gastroenterology;  Laterality: N/A;  . MASTECTOMY, PARTIAL Right 07/30/2017   Procedure: MASTECTOMY PARTIAL;  Surgeon: Robert Bellow, MD;  Location: [facility] ORS;  Service: General;  Laterality: Right;  . PORTACATH PLACEMENT Left 03/15/2017   Procedure: INSERTION PORT-A-CATH;   Surgeon: Robert Bellow, MD;  Location: [facility] ORS;  Service: General;  Laterality: Left;  . RE-EXCISION OF BREAST LUMPECTOMY Right 08/17/2017    INVASIVE CARCINOMA EXTENDS TO NEW LATERAL MARGIN. /RE-EXCISION OF BREAST LUMPECTOMY;: Byrnett, Forest Gleason, MD;  [facility] ORS; General;  Laterality: Right;  . SENTINEL NODE BIOPSY Right 07/30/2017   Procedure: SENTINEL NODE BIOPSY;  Surgeon: Robert Bellow, MD;  Location: [facility] ORS;  Service: General;  Laterality: Right;  . SIMPLE MASTECTOMY WITH AXILLARY SENTINEL NODE BIOPSY Right 09/28/2017   Procedure: SIMPLE MASTECTOMY;  Surgeon: Robert Bellow, MD;  Location: [facility] ORS;  Service: General;  Laterality: Right;    There were no vitals filed for this visit.  Subjective Assessment - 04/08/20 1757    Subjective  Patient arrives very late to appt, d/t work, allowing time only for HEP review and goal assessment    Pertinent History  Pt is a 54 year old female s/p masectomy w/ lymphectomy in 2019, following chemo and radiation treatment for R breast cancer diagnosis. Has had lymphadema since beginning of 2020, and has seen improvement for this following OT treatment here since Dec 2020. She is being seen by PT today d/t progressive R shoulder weakness and decrease of motion over the past year d/t scar tissue, and lymphadema. 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 She reports difficulty with all overhead motion, lifting (reports she cannot lift a gallon of milk with RUE), and can only drive with hands at lower part of wheel. Reports occasional tension/dull pain in the shoulder/neck pain that is random in nature, but usually comes on at the end of the day when she has used the UE often during the day. Worst pain over the past week: 7/10 best 0/10. Pt denies N/V, B&B changes, unexplained weight fluctuation, saddle paresthesia, fever, night sweats, or unrelenting night pain at this time    Limitations  Lifting;House hold activities    How long  can you sit comfortably?  unlimited    How long can you stand comfortably?  76mins    How long can you walk comfortably?  66mins    Diagnostic tests  None    Patient Stated Goals  To be able to use my RUE like I do my LUE    Pain Onset  More than a month ago         Ther-Ex Goal reassessmnet ROM, AROM, and MMT, education on PROM < AROM and need for continued AAROM therex at home to aid in joint mobility. Education on strength gains made through therex with encouragement to add Y/t/I to HEP with understanding of difference between strength and mobility therex 8# DB lift onto shelf at shoulder height with patient able to do with heavy compensation                        PT Education - 04/08/20 1742    Education Details  HEP review, goal reassessment    Person(s) Educated  Patient    Methods  Explanation;Demonstration;Verbal cues    Comprehension  Verbalized understanding;Returned demonstration;Verbal cues required       PT Short Term Goals - 04/08/20 1811      PT SHORT TERM GOAL #1   Title  Pt will be independent with HEP in order to improve strength and decrease pain in order to improve pain-free function at home and work.    Baseline  04/08/20 Completing HEP regularly 02/23/20 HEP given    Time  5    Period  Weeks    Status  Achieved      PT SHORT TERM GOAL #2   Title  Patient will demonstrate full passive shoulder ROM in order to build full active motion needed for overhead tasks    Baseline  04/08/20 FLEX: 142D  Abd 113d 02/23/20 flex: 132d abd 102d    Time  5    Period  Weeks    Status  On-going        PT Long Term Goals - 04/08/20 1809      PT LONG TERM GOAL #1   Title  Patient will increase FOTO score to 59 to demonstrate predicted increase in functional mobility to complete ADLs    Baseline  04/08/20 70 02/23/20 38    Time  8    Period  Weeks    Status  Achieved      PT LONG TERM GOAL #2   Title  Pt will increase gross periscapular strength to at  least 4+/5 MMT grade in order to perform overhead household tasks    Baseline  04/08/20 R/L Lat 4+/4+  Y 3+/4 T 4+/4 I 4+/4+ 02/23/20 R/L Lat 4/4+  Y 2/4 T 3+/4 I 4/4+    Time  8    Period  Weeks  Status  On-going      PT LONG TERM GOAL #3   Title  Patient will demonstrate full active R shoulder abd and flex in order to complete overhead tasks    Baseline  04/08/20 flex: 143d  abd: 102d 02/23/20 flex: 110d abd 90d    Time  8    Period  Weeks    Status  On-going      PT LONG TERM GOAL #4   Title  Patient will be able to lift 8# DB onto a shelf at eye level to demonstrate ability to lift a gallon of milk to top shelf of a fridge    Baseline  04/08/20 able to complete but with heavy compensation of trunk lean and UT involvement 02/23/20 unable    Time  8    Period  Weeks    Status  On-going            Plan - 04/08/20 1820    Clinical Impression Statement  Pt very late to session d/t compliacations at work, allowing time only for goal reassessment and HEP review/update. patient verbalizes understanding of results of goal review, with strong improvements in AROM and overhead strength, and small, but significant improvements in PROM. PT educated patient on importance of continuing AAROM to increase passive joint mobility as well as adding strengthening into HEP to maintain strength gains made through PT. Pt has made progress toward all goals, though her AROM and PROM are not WNL needed for overhead ADLs. [place] will continue to benefit from skilled PT to address scar tissue, joint mobility, and strengthening/motor control to continue progression toward goals.    Personal Factors and Comorbidities  Age;Comorbidity 1;Comorbidity 2;Fitness;Sex;Profession    Comorbidities  HTN, breast cancer    Examination-Activity Limitations  Bathing;Lift;Carry;Dressing;Reach Overhead    Examination-Participation Restrictions  Laundry;Meal Prep;Yard Work;Community Activity;Driving    Stability/Clinical Decision  Making  Evolving/Moderate complexity    Clinical Decision Making  Moderate    Rehab Potential  Good    PT Frequency  2x / week    PT Duration  8 weeks    PT Treatment/Interventions  ADLs/Self Care Home Management;Electrical Stimulation;Therapeutic activities;Patient/family education;Taping;Joint Manipulations;Spinal Manipulations;Passive range of motion;Dry needling;Manual techniques;Functional mobility training;Ultrasound;Cryotherapy;Traction;Neuromuscular re-education;Therapeutic exercise;DME Instruction;Moist Heat;Iontophoresis 4mg /ml Dexamethasone    PT Next Visit Plan  scar tissue massage    PT Home Exercise Plan  [id number]    Consulted and Agree with Plan of Care  Patient       Patient will benefit from skilled therapeutic intervention in order to improve the following deficits and impairments:  Increased fascial restricitons, Pain, Improper body mechanics, Decreased mobility, Decreased scar mobility, Impaired tone, Postural dysfunction, Decreased activity tolerance, Decreased endurance, Decreased range of motion, Decreased strength, Impaired UE functional use, Impaired flexibility, Increased edema  Visit Diagnosis: Chronic right shoulder pain  Stiffness of right shoulder, not elsewhere classified     Problem List Patient Active Problem List   Diagnosis Date Noted  . Well woman exam with routine gynecological exam 02/27/2020  . Intermittent tingling sensation of right hand and foot 02/26/2020  . Numbness and tingling in right hand 02/26/2020  . Hypokalemia 02/12/2020  . 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 Chemotherapy-induced neuropathy ([place]) 01/29/2019  . Pyuria 11/07/2018  . Urinary urgency 11/07/2018  . Hyperlipidemia associated with type 2 diabetes mellitus ([place]) 10/02/2018  . Neuropathy due to chemotherapeutic drug ([place]) 10/02/2018  . Myalgia due to statin 10/02/2018  . Controlled type 2 diabetes mellitus with hyperglycemia ([place]) 07/05/2018  .  Diverticulosis of large  intestine without diverticulitis 03/01/2018  . Personal history of chemotherapy 01/03/2018  . Peripheral neuropathy due to chemotherapy ([place]) 07/04/2017  . Malignant neoplasm of upper-outer quadrant of right breast in female, estrogen receptor positive ([place]) 03/05/2017  . Goals of care, counseling/discussion 03/05/2017  . Essential hypertension 01/05/2016   Shelton Silvas PT, DPT Shelton Silvas 04/09/2020, 9:14 AM  [facility] [address] [place], [address] Phone: [phone]   Fax:  [phone]  Name: Chelsea Hudson MRN: [id number] Date of Birth: 1965/12/30

## 2020-04-14 ENCOUNTER — Ambulatory Visit: Payer: Medicaid Other | Admitting: Physical Therapy

## 2020-04-21 ENCOUNTER — Ambulatory Visit: Payer: Medicaid Other | Admitting: Physical Therapy

## 2020-04-22 ENCOUNTER — Other Ambulatory Visit: Payer: Self-pay

## 2020-04-22 ENCOUNTER — Ambulatory Visit: Payer: Medicaid Other | Attending: Oncology | Admitting: Physical Therapy

## 2020-04-22 ENCOUNTER — Encounter: Payer: Self-pay | Admitting: Physical Therapy

## 2020-04-22 DIAGNOSIS — M25511 Pain in right shoulder: Secondary | ICD-10-CM | POA: Insufficient documentation

## 2020-04-22 DIAGNOSIS — M25611 Stiffness of right shoulder, not elsewhere classified: Secondary | ICD-10-CM | POA: Insufficient documentation

## 2020-04-22 DIAGNOSIS — I972 Postmastectomy lymphedema syndrome: Secondary | ICD-10-CM | POA: Insufficient documentation

## 2020-04-22 DIAGNOSIS — G8929 Other chronic pain: Secondary | ICD-10-CM | POA: Insufficient documentation

## 2020-04-22 NOTE — Therapy (Signed)
[place] [facility] [address], [place], [address] Phone: [phone]   Fax:  [phone]  Physical Therapy Treatment  Patient Details  Name: Chelsea Hudson MRN: [id number] Date of Birth: 12-10-1965 No data recorded  Encounter Date: 04/22/2020  PT End of Session - 04/22/20 1711    Visit Number  1    Number of Visits  6    Authorization Time Period  6 visits - 05/25/20    Authorization - Visit Number  1    Authorization - Number of Visits  6    PT Start Time  0445    PT Stop Time  0525    PT Time Calculation (min)  40 min    Activity Tolerance  Patient tolerated treatment well    Behavior During Therapy  [facility] for tasks assessed/performed       Past Medical History:  Diagnosis Date   Arthritis    SHOULDER   Breast cancer ([place]) 02/2017   rt breast   Cancer ([place]) 02/28/2017   INVASIVE MAMMARY CARCINOMA WITH MUCINOUS FEATURES.   Colonic diverticular abscess 06/21/2017   Colonoscopy 06/25/2017: No evidence of malignancy.   Diabetes mellitus without complication ([place])    Hypertension    Irregular heart beat    PT STATES IT "SKIPS A BEAT"    Obesity    Personal history of chemotherapy    Personal history of radiation therapy     Past Surgical History:  Procedure Laterality Date   ANKLE SURGERY     BREAST BIOPSY Right 02/28/2017   INVASIVE MAMMARY CARCINOMA WITH MUCINOUS FEATURES.   BREAST CYST ASPIRATION Right    NEG   COLONOSCOPY WITH PROPOFOL N/A 06/25/2017   Procedure: COLONOSCOPY WITH PROPOFOL;  Surgeon: Jonathon Bellows, MD;  Location: [facility] ENDOSCOPY;  Service: Gastroenterology;  Laterality: N/A;   MASTECTOMY, PARTIAL Right 07/30/2017   Procedure: MASTECTOMY PARTIAL;  Surgeon: Robert Bellow, MD;  Location: [facility] ORS;  Service: General;  Laterality: Right;   PORTACATH PLACEMENT Left 03/15/2017   Procedure: INSERTION PORT-A-CATH;  Surgeon: Robert Bellow, MD;  Location: [facility] ORS;   Service: General;  Laterality: Left;   RE-EXCISION OF BREAST LUMPECTOMY Right 08/17/2017    INVASIVE CARCINOMA EXTENDS TO NEW LATERAL MARGIN. /RE-EXCISION OF BREAST LUMPECTOMY;: Robert Bellow, MD;  [facility]; General;  Laterality: Right;   SENTINEL NODE BIOPSY Right 07/30/2017   Procedure: SENTINEL NODE BIOPSY;  Surgeon: Robert Bellow, MD;  Location: [facility] ORS;  Service: General;  Laterality: Right;   SIMPLE MASTECTOMY WITH AXILLARY SENTINEL NODE BIOPSY Right 09/28/2017   Procedure: SIMPLE MASTECTOMY;  Surgeon: Robert Bellow, MD;  Location: [facility]S;  Service: General;  Laterality: Right;    There were no vitals filed for this visit.  Subjective Assessment - 04/22/20 1649    Subjective  Patient reports no pain as of today, reports that she has been completing her HEP with no questions or concerns.    Pertinent History  Pt is a 54 year old female s/p masectomy w/ lymphectomy in 2019, following chemo and radiation treatment for R breast cancer diagnosis. Has had lymphadema since beginning of 2020, and has seen improvement for this following OT treatment here since Dec 2020. She is being seen by PT today d/t progressive R shoulder weakness and decrease of motion over the past year d/t scar tissue, and lymphadema. 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 She reports difficulty with  all overhead motion, lifting (reports she cannot lift a gallon of milk with RUE), and can only drive with hands at lower part of wheel. Reports occasional tension/dull pain in the shoulder/neck pain that is random in nature, but usually comes on at the end of the day when she has used the UE often during the day. Worst pain over the past week: 7/10 best 0/10. Pt denies N/V, B&B changes, unexplained weight fluctuation, saddle paresthesia, fever, night sweats, or unrelenting night pain at this time    Limitations  Lifting;House hold activities    How long can you sit comfortably?  unlimited     How long can you stand comfortably?  30mins    How long can you walk comfortably?  69mins    Diagnostic tests  None    Patient Stated Goals  To be able to use my RUE like I do my LUE    Pain Onset  More than a month ago         Ther-Ex Pulleys abd and flex 65mins each way with 5-10sec hold in max of each position each rep Y on door 2x 10 with minimal lift but no assistance Lat pulldown 25# 3x 10 with min cuing initially to prevent shoulder hiking with good carry over Overhead carry 2# DB 2x 77ft with demo and max cuing for setup with good carry over following Overhead doorway pec stretch x39min hold  Manual Scar massage x38mins; increased time spent on lateral part of scar at axilla Mobilization with movement into flex 62min, abd 27min and ER 85min AROM following Posterior mobilization G3 6bouts 30sec for increased mobility                         PT Education - 04/22/20 1711    Education Details  Therex form/technique    Person(s) Educated  Patient    Methods  Explanation;Demonstration;Verbal cues    Comprehension  Verbalized understanding;Returned demonstration;Verbal cues required       PT Short Term Goals - 04/08/20 1811      PT SHORT TERM GOAL #1   Title  Pt will be independent with HEP in order to improve strength and decrease pain in order to improve pain-free function at home and work.    Baseline  04/08/20 Completing HEP regularly 02/23/20 HEP given    Time  5    Period  Weeks    Status  Achieved      PT SHORT TERM GOAL #2   Title  Patient will demonstrate full passive shoulder ROM in order to build full active motion needed for overhead tasks    Baseline  04/08/20 FLEX: 142D  Abd 113d 02/23/20 flex: 132d abd 102d    Time  5    Period  Weeks    Status  On-going        PT Long Term Goals - 04/08/20 1809      PT LONG TERM GOAL #1   Title  Patient will increase FOTO score to 59 to demonstrate predicted increase in functional mobility to complete  ADLs    Baseline  04/08/20 70 02/23/20 38    Time  8    Period  Weeks    Status  Achieved      PT LONG TERM GOAL #2   Title  Pt will increase gross periscapular strength to at least 4+/5 MMT grade in order to perform overhead household tasks    Baseline  04/08/20  R/L Lat 4+/4+  Y 3+/4 T 4+/4 I 4+/4+ 02/23/20 R/L Lat 4/4+  Y 2/4 T 3+/4 I 4/4+    Time  8    Period  Weeks    Status  On-going      PT LONG TERM GOAL #3   Title  Patient will demonstrate full active R shoulder abd and flex in order to complete overhead tasks    Baseline  04/08/20 flex: 143d  abd: 102d 02/23/20 flex: 110d abd 90d    Time  8    Period  Weeks    Status  On-going      PT LONG TERM GOAL #4   Title  Patient will be able to lift 8# DB onto a shelf at eye level to demonstrate ability to lift a gallon of milk to top shelf of a fridge    Baseline  04/08/20 able to complete but with heavy compensation of trunk lean and UT involvement 02/23/20 unable    Time  8    Period  Weeks    Status  On-going            Plan - 04/22/20 1731    Clinical Impression Statement  PT continued to utilize manual techniques for scar tissue and to increase mobility with good success. PT continued therex progression for overhead mobility and strengthening with patient able to comply with all cuing for proper technique, good motivation throughout session, and no increased pain. PT will continue progression as able.    Personal Factors and Comorbidities  Age;Comorbidity 1;Comorbidity 2;Fitness;Sex;Profession    Comorbidities  HTN, breast cancer    Examination-Activity Limitations  Bathing;Lift;Carry;Dressing;Reach Overhead    Examination-Participation Restrictions  Laundry;Meal Prep;Yard Work;Community Activity;Driving    Stability/Clinical Decision Making  Evolving/Moderate complexity    Clinical Decision Making  Moderate    Rehab Potential  Good    PT Frequency  2x / week    PT Duration  8 weeks    PT Treatment/Interventions  ADLs/Self Care  Home Management;Electrical Stimulation;Therapeutic activities;Patient/family education;Taping;Joint Manipulations;Spinal Manipulations;Passive range of motion;Dry needling;Manual techniques;Functional mobility training;Ultrasound;Cryotherapy;Traction;Neuromuscular re-education;Therapeutic exercise;DME Instruction;Moist Heat;Iontophoresis 4mg /ml Dexamethasone    PT Next Visit Plan  scar tissue massage    PT Home Exercise Plan  [id number]    Consulted and Agree with Plan of Care  Patient       Patient will benefit from skilled therapeutic intervention in order to improve the following deficits and impairments:  Increased fascial restricitons, Pain, Improper body mechanics, Decreased mobility, Decreased scar mobility, Impaired tone, Postural dysfunction, Decreased activity tolerance, Decreased endurance, Decreased range of motion, Decreased strength, Impaired UE functional use, Impaired flexibility, Increased edema  Visit Diagnosis: Chronic right shoulder pain  Stiffness of right shoulder, not elsewhere classified     Problem List Patient Active Problem List   Diagnosis Date Noted   Well woman exam with routine gynecological exam 02/27/2020   Intermittent tingling sensation of right hand and foot 02/26/2020   Numbness and tingling in right hand 02/26/2020   Hypokalemia 02/12/2020   Colovesical fistula 01/31/2019   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/29/2019   Pyuria 11/07/2018   Urinary urgency 11/07/2018   Hyperlipidemia associated with type 2 diabetes mellitus ([place]) 10/02/2018   Neuropathy due to chemotherapeutic drug ([place]) 10/02/2018   Myalgia due to statin 10/02/2018   Controlled type 2 diabetes mellitus with hyperglycemia ([place]) 07/05/2018   Diverticulosis of large intestine without diverticulitis 03/01/2018   Personal history of chemotherapy 01/03/2018   Peripheral neuropathy due to chemotherapy ([place])  07/04/2017   Malignant neoplasm of upper-outer quadrant  of right breast in female, estrogen receptor positive ([place]) 03/05/2017   Goals of care, counseling/discussion 03/05/2017   Essential hypertension 01/05/2016   Shelton Silvas PT, DPT Shelton Silvas 04/22/2020, 5:36 PM  [facility] [place] [facility] [address] [place], [address] Phone: [phone]   Fax:  [phone]  Name: ARASELY FELDER MRN: [id number] Date of Birth: 26-Mar-1966

## 2020-04-25 ENCOUNTER — Other Ambulatory Visit: Payer: Self-pay | Admitting: Oncology

## 2020-04-26 ENCOUNTER — Encounter: Payer: Self-pay | Admitting: Emergency Medicine

## 2020-04-26 NOTE — Telephone Encounter (Signed)
Patient has not been seen since 09/2018.  Can we get her an appointment in the next 2-3 weeks.  Thanks!

## 2020-04-26 NOTE — Telephone Encounter (Signed)
I sent patient a mychart message that we need to see her in the office, and she will be getting a call to schedule an appointment.

## 2020-04-26 NOTE — Telephone Encounter (Signed)
Pt is requesting a refill request from Dr. Grayland Ormond, but she transferred her care to you back in 2019. Could someone please contact patient about the medication please.

## 2020-04-27 ENCOUNTER — Encounter: Payer: Self-pay | Admitting: Physical Therapy

## 2020-04-27 ENCOUNTER — Other Ambulatory Visit: Payer: Self-pay

## 2020-04-27 ENCOUNTER — Ambulatory Visit: Payer: Medicaid Other | Admitting: Physical Therapy

## 2020-04-27 DIAGNOSIS — M25511 Pain in right shoulder: Secondary | ICD-10-CM

## 2020-04-27 DIAGNOSIS — M25611 Stiffness of right shoulder, not elsewhere classified: Secondary | ICD-10-CM

## 2020-04-27 NOTE — Therapy (Signed)
[place] [facility] [address] [place], [address] Phone: [phone]   Fax:  [phone]  Physical Therapy Treatment  Patient Details  Name: Chelsea Hudson MRN: [id number] Date of Birth: 1965-12-30 No data recorded  Encounter Date: 04/27/2020  PT End of Session - 04/27/20 1759    Visit Number  2    Number of Visits  6    Date for PT Re-Evaluation  04/23/20    Authorization - Visit Number  2    Authorization - Number of Visits  6    PT Start Time  0553    PT Stop Time  0631    PT Time Calculation (min)  38 min       Past Medical History:  Diagnosis Date  . Arthritis    SHOULDER  . Breast cancer ([place]) 02/2017   rt breast  . Cancer ([place]) 02/28/2017   INVASIVE MAMMARY CARCINOMA WITH MUCINOUS FEATURES.  . Colonic diverticular abscess 06/21/2017   Colonoscopy 06/25/2017: No evidence of malignancy.  . Diabetes mellitus without complication ([place])   . Hypertension   . Irregular heart beat    PT STATES IT "SKIPS A BEAT"   . Obesity   . Personal history of chemotherapy   . Personal history of radiation therapy     Past Surgical History:  Procedure Laterality Date  . ANKLE SURGERY    . BREAST BIOPSY Right 02/28/2017   INVASIVE MAMMARY CARCINOMA WITH MUCINOUS FEATURES.  Marland Kitchen BREAST CYST ASPIRATION Right    NEG  . COLONOSCOPY WITH PROPOFOL N/A 06/25/2017   Procedure: COLONOSCOPY WITH PROPOFOL;  Surgeon: Jonathon Bellows, MD;  Location: [facility] ENDOSCOPY;  Service: Gastroenterology;  Laterality: N/A;  . MASTECTOMY, PARTIAL Right 07/30/2017   Procedure: MASTECTOMY PARTIAL;  Surgeon: Robert Bellow, MD;  Location: [facility];  Service: General;  Laterality: Right;  . PORTACATH PLACEMENT Left 03/15/2017   Procedure: INSERTION PORT-A-CATH;  Surgeon: Robert Bellow, MD;  Location: [facility]S;  Service: General;  Laterality: Left;  . RE-EXCISION OF BREAST LUMPECTOMY Right 08/17/2017    INVASIVE CARCINOMA EXTENDS TO NEW LATERAL  MARGIN. /RE-EXCISION OF BREAST LUMPECTOMY;: Byrnett, Forest Gleason, MD;  [facility]; General;  Laterality: Right;  . SENTINEL NODE BIOPSY Right 07/30/2017   Procedure: SENTINEL NODE BIOPSY;  Surgeon: Robert Bellow, MD;  Location: [facility];  Service: General;  Laterality: Right;  . SIMPLE MASTECTOMY WITH AXILLARY SENTINEL NODE BIOPSY Right 09/28/2017   Procedure: SIMPLE MASTECTOMY;  Surgeon: Robert Bellow, MD;  Location: [facility];  Service: General;  Laterality: Right;    There were no vitals filed for this visit.  Subjective Assessment - 04/27/20 1757    Subjective  Reports no pain today, good HEP compliance.    Pertinent History  Pt is a 54 year old female s/p masectomy w/ lymphectomy in 2019, following chemo and radiation treatment for R breast cancer diagnosis. Has had lymphadema since beginning of 2020, and has seen improvement for this following OT treatment here since Dec 2020. She is being seen by PT today d/t progressive R shoulder weakness and decrease of motion over the past year d/t scar tissue, and lymphadema. 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 She reports difficulty with all overhead motion, lifting (reports she cannot lift a gallon of milk with RUE), and can only drive with hands at lower part of wheel. Reports occasional tension/dull pain in the shoulder/neck pain that is random  in nature, but usually comes on at the end of the day when she has used the UE often during the day. Worst pain over the past week: 7/10 best 0/10. Pt denies N/V, B&B changes, unexplained weight fluctuation, saddle paresthesia, fever, night sweats, or unrelenting night pain at this time    Limitations  Lifting;House hold activities    How long can you sit comfortably?  unlimited    How long can you stand comfortably?  6mins    How long can you walk comfortably?  39mins    Diagnostic tests  None    Patient Stated Goals  To be able to use my RUE like I do my LUE    Pain Onset   More than a month ago         Ther-Ex Pulleys abd and flex 53mins each way with 5-10sec hold in max of each position each rep Lat pulldown 25# x10; 35# x6; 30# x8 with min cuing initially to prevent shoulder hiking with good carry over Overhead carry 2# DB x3 39ft with demo and min cuing; attempted with 3# unable Overhead doorway pec stretch x54min hold  Manual Scar massage x57mins; increased time spent on lateral part of scar at axilla Mobilization with movement into flex 78min, abd 79min and ER 84min AROM following Posterior mobilization G3 6bouts 30sec for increased mobility                          PT Education - 04/27/20 1759    Education Details  therex form/technique    Person(s) Educated  Patient    Methods  Explanation;Demonstration;Verbal cues    Comprehension  Verbalized understanding;Returned demonstration;Verbal cues required       PT Short Term Goals - 04/08/20 1811      PT SHORT TERM GOAL #1   Title  Pt will be independent with HEP in order to improve strength and decrease pain in order to improve pain-free function at home and work.    Baseline  04/08/20 Completing HEP regularly 02/23/20 HEP given    Time  5    Period  Weeks    Status  Achieved      PT SHORT TERM GOAL #2   Title  Patient will demonstrate full passive shoulder ROM in order to build full active motion needed for overhead tasks    Baseline  04/08/20 FLEX: 142D  Abd 113d 02/23/20 flex: 132d abd 102d    Time  5    Period  Weeks    Status  On-going        PT Long Term Goals - 04/08/20 1809      PT LONG TERM GOAL #1   Title  Patient will increase FOTO score to 59 to demonstrate predicted increase in functional mobility to complete ADLs    Baseline  04/08/20 70 02/23/20 38    Time  8    Period  Weeks    Status  Achieved      PT LONG TERM GOAL #2   Title  Pt will increase gross periscapular strength to at least 4+/5 MMT grade in order to perform overhead household tasks     Baseline  04/08/20 R/L Lat 4+/4+  Y 3+/4 T 4+/4 I 4+/4+ 02/23/20 R/L Lat 4/4+  Y 2/4 T 3+/4 I 4/4+    Time  8    Period  Weeks    Status  On-going      PT LONG  TERM GOAL #3   Title  Patient will demonstrate full active R shoulder abd and flex in order to complete overhead tasks    Baseline  04/08/20 flex: 143d  abd: 102d 02/23/20 flex: 110d abd 90d    Time  8    Period  Weeks    Status  On-going      PT LONG TERM GOAL #4   Title  Patient will be able to lift 8# DB onto a shelf at eye level to demonstrate ability to lift a gallon of milk to top shelf of a fridge    Baseline  04/08/20 able to complete but with heavy compensation of trunk lean and UT involvement 02/23/20 unable    Time  8    Period  Weeks    Status  On-going            Plan - 04/27/20 1830    Clinical Impression Statement  PT continued to utilize manual techniques for scar tissue to increase mobility with good success. PT continued therex progression for increased overhead mobility and strength with good carry over of all provided cuing and motivation throughout session.PT will continue progression as able.    Personal Factors and Comorbidities  Age;Comorbidity 1;Comorbidity 2;Fitness;Sex;Profession    Comorbidities  HTN, breast cancer    Examination-Activity Limitations  Bathing;Lift;Carry;Dressing;Reach Overhead    Examination-Participation Restrictions  Laundry;Meal Prep;Yard Work;Community Activity;Driving    Stability/Clinical Decision Making  Evolving/Moderate complexity    Clinical Decision Making  Moderate    Rehab Potential  Good    PT Frequency  2x / week    PT Duration  8 weeks    PT Treatment/Interventions  ADLs/Self Care Home Management;Electrical Stimulation;Therapeutic activities;Patient/family education;Taping;Joint Manipulations;Spinal Manipulations;Passive range of motion;Dry needling;Manual techniques;Functional mobility training;Ultrasound;Cryotherapy;Traction;Neuromuscular re-education;Therapeutic  exercise;DME Instruction;Moist Heat;Iontophoresis 4mg /ml Dexamethasone    PT Next Visit Plan  scar tissue massage    PT Home Exercise Plan  [id number]    Consulted and Agree with Plan of Care  Patient       Patient will benefit from skilled therapeutic intervention in order to improve the following deficits and impairments:  Increased fascial restricitons, Pain, Improper body mechanics, Decreased mobility, Decreased scar mobility, Impaired tone, Postural dysfunction, Decreased activity tolerance, Decreased endurance, Decreased range of motion, Decreased strength, Impaired UE functional use, Impaired flexibility, Increased edema  Visit Diagnosis: Chronic right shoulder pain  Stiffness of right shoulder, not elsewhere classified     Problem List Patient Active Problem List   Diagnosis Date Noted  . Well woman exam with routine gynecological exam 02/27/2020  . Intermittent tingling sensation of right hand and foot 02/26/2020  . Numbness and tingling in right hand 02/26/2020  . Hypokalemia 02/12/2020  . 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 Chemotherapy-induced neuropathy ([place]) 01/29/2019  . Pyuria 11/07/2018  . Urinary urgency 11/07/2018  . Hyperlipidemia associated with type 2 diabetes mellitus ([place]) 10/02/2018  . Neuropathy due to chemotherapeutic drug ([place]) 10/02/2018  . Myalgia due to statin 10/02/2018  . Controlled type 2 diabetes mellitus with hyperglycemia ([place]) 07/05/2018  . Diverticulosis of large intestine without diverticulitis 03/01/2018  . Personal history of chemotherapy 01/03/2018  . Peripheral neuropathy due to chemotherapy ([place]) 07/04/2017  . Malignant neoplasm of upper-outer quadrant of right breast in female, estrogen receptor positive ([place]) 03/05/2017  . Goals of care, counseling/discussion 03/05/2017  . Essential hypertension 01/05/2016   Durwin Reges DPT Durwin Reges 04/27/2020, 6:32 PM  [place] [facility] [address], [place], [address] Phone: [phone]   Fax:  [phone]  Name: Chelsea Hudson MRN: [id number] Date of Birth: 06/12/1966

## 2020-05-06 ENCOUNTER — Other Ambulatory Visit: Payer: Self-pay

## 2020-05-06 ENCOUNTER — Ambulatory Visit: Payer: Medicaid Other | Admitting: Physical Therapy

## 2020-05-06 DIAGNOSIS — E876 Hypokalemia: Secondary | ICD-10-CM

## 2020-05-06 MED ORDER — POTASSIUM CHLORIDE CRYS ER 10 MEQ PO TBCR: 10.0000 meq | EXTENDED_RELEASE_TABLET | Freq: Two times a day (BID) | ORAL | 1 refills | Status: DC

## 2020-05-12 ENCOUNTER — Encounter: Payer: Self-pay | Admitting: Physical Therapy

## 2020-05-12 ENCOUNTER — Other Ambulatory Visit: Payer: Self-pay

## 2020-05-12 ENCOUNTER — Ambulatory Visit: Payer: Medicaid Other | Admitting: Physical Therapy

## 2020-05-12 DIAGNOSIS — M25511 Pain in right shoulder: Secondary | ICD-10-CM

## 2020-05-12 DIAGNOSIS — M25611 Stiffness of right shoulder, not elsewhere classified: Secondary | ICD-10-CM

## 2020-05-12 DIAGNOSIS — I972 Postmastectomy lymphedema syndrome: Secondary | ICD-10-CM

## 2020-05-12 NOTE — Therapy (Signed)
[facility] [address] [place], [address] Phone: [phone]   Fax:  [phone]  Physical Therapy Treatment  Patient Details  Name: Chelsea Hudson MRN: [id number] Date of Birth: [birth year] No data recorded  Encounter Date: 05/12/2020   PT End of Session - 05/12/20 1603    Visit Number 3    Number of Visits 6    Date for PT Re-Evaluation 04/23/20    Authorization Time Period 6 visits - 05/25/20    Authorization - Visit Number 3    Authorization - Number of Visits 6    PT Start Time 0330    PT Stop Time 0410    PT Time Calculation (min) 40 min    Activity Tolerance Patient tolerated treatment well    Behavior During Therapy [facility] for tasks assessed/performed           Past Medical History:  Diagnosis Date  . Arthritis    SHOULDER  . Breast cancer ([place]) 02/2017   rt breast  . Cancer ([place]) 02/28/2017   INVASIVE MAMMARY CARCINOMA WITH MUCINOUS FEATURES.  . Colonic diverticular abscess 06/21/2017   Colonoscopy 06/25/2017: No evidence of malignancy.  . Diabetes mellitus without complication ([place])   . Hypertension   . Irregular heart beat    PT STATES IT "SKIPS A BEAT"   . Obesity   . Personal history of chemotherapy   . Personal history of radiation therapy     Past Surgical History:  Procedure Laterality Date  . ANKLE SURGERY    . BREAST BIOPSY Right 02/28/2017   INVASIVE MAMMARY CARCINOMA WITH MUCINOUS FEATURES.  Marland Kitchen BREAST CYST ASPIRATION Right    NEG  . COLONOSCOPY WITH PROPOFOL N/A 06/25/2017   Procedure: COLONOSCOPY WITH PROPOFOL;  Surgeon: Jonathon Bellows, MD;  Location: [facility] ENDOSCOPY;  Service: Gastroenterology;  Laterality: N/A;  . MASTECTOMY, PARTIAL Right 07/30/2017   Procedure: MASTECTOMY PARTIAL;  Surgeon: Robert Bellow, MD;  Location: [facility] ORS;  Service: General;  Laterality: Right;  . PORTACATH PLACEMENT Left 03/15/2017   Procedure: INSERTION PORT-A-CATH;  Surgeon: Robert Bellow, MD;  Location: [facility] ORS;  Service: General;  Laterality: Left;  . RE-EXCISION OF BREAST LUMPECTOMY Right 08/17/2017    INVASIVE CARCINOMA EXTENDS TO NEW LATERAL MARGIN. /RE-EXCISION OF BREAST LUMPECTOMY;: Byrnett, Forest Gleason, MD;  [facility] ORS; General;  Laterality: Right;  . SENTINEL NODE BIOPSY Right 07/30/2017   Procedure: SENTINEL NODE BIOPSY;  Surgeon: Robert Bellow, MD;  Location: [facility] ORS;  Service: General;  Laterality: Right;  . SIMPLE MASTECTOMY WITH AXILLARY SENTINEL NODE BIOPSY Right 09/28/2017   Procedure: SIMPLE MASTECTOMY;  Surgeon: Robert Bellow, MD;  Location: [facility] ORS;  Service: General;  Laterality: Right;    There were no vitals filed for this visit.   Subjective Assessment - 05/12/20 1530    Subjective Reports no pain today, is feeling good overall. Reports that she is completing HEP with no questions or concerns.    Pertinent History Pt is a 54 year old female s/p masectomy w/ lymphectomy in 2019, following chemo and radiation treatment for R breast cancer diagnosis. Has had lymphadema since beginning of 2020, and has seen improvement for this following OT treatment here since Dec 2020. She is being seen by PT today d/t progressive R shoulder weakness and decrease of motion over the past year d/t scar tissue, and lymphadema. 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 She reports difficulty  with all overhead motion, lifting (reports she cannot lift a gallon of milk with RUE), and can only drive with hands at lower part of wheel. Reports occasional tension/dull pain in the shoulder/neck pain that is random in nature, but usually comes on at the end of the day when she has used the UE often during the day. Worst pain over the past week: 7/10 best 0/10. Pt denies N/V, B&B changes, unexplained weight fluctuation, saddle paresthesia, fever, night sweats, or unrelenting night pain at this time    Limitations Lifting;House hold activities    How long can  you sit comfortably? unlimited    How long can you stand comfortably? 69mins    How long can you walk comfortably? 76mins    Diagnostic tests None    Patient Stated Goals To be able to use my RUE like I do my LUE    Pain Onset More than a month ago              Ther-Ex Pulleys abd and flex 47mins each way with 5-10sec hold in max of each position each rep Lat pull over supine 5# 3x 7/6/6 with cuing for proper technique with good carry over Y on wall 2x 10 with cuing for eccentric control with good carry over Overhead doorway pec stretchx32min hold  Manual Scar massage x68mins; increased time spent on lateral part of scar at axilla Mobilization with movement into flex 51min, abd 43min and ER 41min AROM following Posterior mobilization G3 6bouts 30sec for increased mobility                        PT Education - 05/12/20 1602    Education Details therex form/technique    Person(s) Educated Patient    Methods Explanation;Demonstration;Verbal cues    Comprehension Verbalized understanding;Returned demonstration;Verbal cues required            PT Short Term Goals - 04/08/20 1811      PT SHORT TERM GOAL #1   Title Pt will be independent with HEP in order to improve strength and decrease pain in order to improve pain-free function at home and work.    Baseline 04/08/20 Completing HEP regularly 02/23/20 HEP given    Time 5    Period Weeks    Status Achieved      PT SHORT TERM GOAL #2   Title Patient will demonstrate full passive shoulder ROM in order to build full active motion needed for overhead tasks    Baseline 04/08/20 FLEX: 142D  Abd 113d 02/23/20 flex: 132d abd 102d    Time 5    Period Weeks    Status On-going             PT Long Term Goals - 04/08/20 1809      PT LONG TERM GOAL #1   Title Patient will increase FOTO score to 59 to demonstrate predicted increase in functional mobility to complete ADLs    Baseline 04/08/20 70 02/23/20 38    Time 8     Period Weeks    Status Achieved      PT LONG TERM GOAL #2   Title Pt will increase gross periscapular strength to at least 4+/5 MMT grade in order to perform overhead household tasks    Baseline 04/08/20 R/L Lat 4+/4+  Y 3+/4 T 4+/4 I 4+/4+ 02/23/20 R/L Lat 4/4+  Y 2/4 T 3+/4 I 4/4+    Time 8    Period  Weeks    Status On-going      PT LONG TERM GOAL #3   Title Patient will demonstrate full active R shoulder abd and flex in order to complete overhead tasks    Baseline 04/08/20 flex: 143d  abd: 102d 02/23/20 flex: 110d abd 90d    Time 8    Period Weeks    Status On-going      PT LONG TERM GOAL #4   Title Patient will be able to lift 8# DB onto a shelf at eye level to demonstrate ability to lift a gallon of milk to top shelf of a fridge    Baseline 04/08/20 able to complete but with heavy compensation of trunk lean and UT involvement 02/23/20 unable    Time 8    Period Weeks    Status On-going                 Plan - 05/12/20 1605    Clinical Impression Statement PT contiued to utilize manual techniques for scar tissue mobility and decreasing muscle tension with good success. Pt is able to demonstrate good carry over of all therex progressions with good carry over of cuing for technique. Pt motivated throughout session with no increased pain. PT will continue progression as able.    Personal Factors and Comorbidities Age;Comorbidity 1;Comorbidity 2;Fitness;Sex;Profession    Comorbidities HTN, breast cancer    Examination-Activity Limitations Bathing;Lift;Carry;Dressing;Reach Overhead    Examination-Participation Restrictions Laundry;Meal Prep;Yard Work;Community Activity;Driving    Stability/Clinical Decision Making Evolving/Moderate complexity    Clinical Decision Making Moderate    Rehab Potential Good    PT Frequency 2x / week    PT Duration 8 weeks    PT Treatment/Interventions ADLs/Self Care Home Management;Electrical Stimulation;Therapeutic activities;Patient/family  education;Taping;Joint Manipulations;Spinal Manipulations;Passive range of motion;Dry needling;Manual techniques;Functional mobility training;Ultrasound;Cryotherapy;Traction;Neuromuscular re-education;Therapeutic exercise;DME Instruction;Moist Heat;Iontophoresis 4mg /ml Dexamethasone    PT Next Visit Plan scar tissue massage    PT Home Exercise Plan [id number]    Consulted and Agree with Plan of Care Patient           Patient will benefit from skilled therapeutic intervention in order to improve the following deficits and impairments:  Increased fascial restricitons, Pain, Improper body mechanics, Decreased mobility, Decreased scar mobility, Impaired tone, Postural dysfunction, Decreased activity tolerance, Decreased endurance, Decreased range of motion, Decreased strength, Impaired UE functional use, Impaired flexibility, Increased edema  Visit Diagnosis: Chronic right shoulder pain  Stiffness of right shoulder, not elsewhere classified  Postmastectomy lymphedema syndrome     Problem List Patient Active Problem List   Diagnosis Date Noted  . Well woman exam with routine gynecological exam 02/27/2020  . Intermittent tingling sensation of right hand and foot 02/26/2020  . Numbness and tingling in right hand 02/26/2020  . Hypokalemia 02/12/2020  . 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 Chemotherapy-induced neuropathy ([place]) 01/29/2019  . Pyuria 11/07/2018  . Urinary urgency 11/07/2018  . Hyperlipidemia associated with type 2 diabetes mellitus ([place]) 10/02/2018  . Neuropathy due to chemotherapeutic drug ([place]) 10/02/2018  . Myalgia due to statin 10/02/2018  . Controlled type 2 diabetes mellitus with hyperglycemia ([place]) 07/05/2018  . Diverticulosis of large intestine without diverticulitis 03/01/2018  . Personal history of chemotherapy 01/03/2018  . Peripheral neuropathy due to chemotherapy ([employer]) 07/04/2017  . Malignant neoplasm of upper-outer quadrant of right breast in female, estrogen  receptor positive ([place]) 03/05/2017  . Goals of care, counseling/discussion 03/05/2017  . Essential hypertension 01/05/2016   Durwin Reges DPT Durwin Reges 05/12/2020, 4:09 PM  [facility] [address], [place], [address] Phone: [phone]   Fax:  [phone]  Name: Chelsea Hudson MRN: [id number] Date of Birth: [birth year]

## 2020-05-13 ENCOUNTER — Ambulatory Visit: Payer: Medicaid Other | Admitting: Physical Therapy

## 2020-05-18 ENCOUNTER — Ambulatory Visit: Payer: Medicaid Other | Admitting: Physical Therapy

## 2020-05-18 ENCOUNTER — Other Ambulatory Visit: Payer: Self-pay

## 2020-05-18 ENCOUNTER — Encounter: Payer: Self-pay | Admitting: Physical Therapy

## 2020-05-18 DIAGNOSIS — G8929 Other chronic pain: Secondary | ICD-10-CM

## 2020-05-18 DIAGNOSIS — M25511 Pain in right shoulder: Secondary | ICD-10-CM | POA: Diagnosis not present

## 2020-05-18 DIAGNOSIS — M25611 Stiffness of right shoulder, not elsewhere classified: Secondary | ICD-10-CM

## 2020-05-18 NOTE — Therapy (Signed)
[facility] [address] [place], [address] Phone: [phone]   Fax:  [phone]  Physical Therapy Treatment  Patient Details  Name: Chelsea Hudson MRN: [id number] Date of Birth: 09/11/1966 No data recorded  Encounter Date: 05/18/2020   PT End of Session - 05/18/20 1733    Visit Number 4    Number of Visits 6    Date for PT Re-Evaluation 04/23/20    Authorization Time Period 6 visits - 05/25/20    Authorization - Visit Number 4    Authorization - Number of Visits 6    PT Start Time 0510    PT Stop Time 0550    PT Time Calculation (min) 40 min    Activity Tolerance Patient tolerated treatment well    Behavior During Therapy [facility] for tasks assessed/performed           Past Medical History:  Diagnosis Date  . Arthritis    SHOULDER  . Breast cancer ([place]) 02/2017   rt breast  . Cancer ([place]) 02/28/2017   INVASIVE MAMMARY CARCINOMA WITH MUCINOUS FEATURES.  . Colonic diverticular abscess 06/21/2017   Colonoscopy 06/25/2017: No evidence of malignancy.  . Diabetes mellitus without complication ([place])   . Hypertension   . Irregular heart beat    PT STATES IT "SKIPS A BEAT"   . Obesity   . Personal history of chemotherapy   . Personal history of radiation therapy     Past Surgical History:  Procedure Laterality Date  . ANKLE SURGERY    . BREAST BIOPSY Right 02/28/2017   INVASIVE MAMMARY CARCINOMA WITH MUCINOUS FEATURES.  Marland Kitchen BREAST CYST ASPIRATION Right    NEG  . COLONOSCOPY WITH PROPOFOL N/A 06/25/2017   Procedure: COLONOSCOPY WITH PROPOFOL;  Surgeon: Jonathon Bellows, MD;  Location: [facility] ENDOSCOPY;  Service: Gastroenterology;  Laterality: N/A;  . MASTECTOMY, PARTIAL Right 07/30/2017   Procedure: MASTECTOMY PARTIAL;  Surgeon: Robert Bellow, MD;  Location: [facility] ORS;  Service: General;  Laterality: Right;  . PORTACATH PLACEMENT Left 03/15/2017   Procedure: INSERTION PORT-A-CATH;  Surgeon: Robert Bellow, MD;  Location: [facility] ORS;  Service: General;  Laterality: Left;  . RE-EXCISION OF BREAST LUMPECTOMY Right 08/17/2017    INVASIVE CARCINOMA EXTENDS TO NEW LATERAL MARGIN. /RE-EXCISION OF BREAST LUMPECTOMY;: Byrnett, Forest Gleason, MD;  [facility] ORS; General;  Laterality: Right;  . SENTINEL NODE BIOPSY Right 07/30/2017   Procedure: SENTINEL NODE BIOPSY;  Surgeon: Robert Bellow, MD;  Location: [facility] ORS;  Service: General;  Laterality: Right;  . SIMPLE MASTECTOMY WITH AXILLARY SENTINEL NODE BIOPSY Right 09/28/2017   Procedure: SIMPLE MASTECTOMY;  Surgeon: Robert Bellow, MD;  Location: [facility] ORS;  Service: General;  Laterality: Right;    There were no vitals filed for this visit.   Subjective Assessment - 05/18/20 1712    Subjective Reports no pain in the shoulder today, is going well overall. Reports she is still having difficulty lifting gallon of milk to high shelf in fridge.    Pertinent History Pt is a 54 year old female s/p masectomy w/ lymphectomy in 2019, following chemo and radiation treatment for R breast cancer diagnosis. Has had lymphadema since beginning of 2020, and has seen improvement for this following OT treatment here since Dec 2020. She is being seen by PT today d/t progressive R shoulder weakness and decrease of motion over the past year d/t scar tissue, and lymphadema. 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 She reports difficulty with all overhead motion, lifting (reports she cannot lift a gallon of milk with RUE), and can only drive with hands at lower part of wheel. Reports occasional tension/dull pain in the shoulder/neck pain that is random in nature, but usually comes on at the end of the day when she has used the UE often during the day. Worst pain over the past week: 7/10 best 0/10. Pt denies N/V, B&B changes, unexplained weight fluctuation, saddle paresthesia, fever, night sweats, or unrelenting night pain at this time    Limitations Lifting;House  hold activities    How long can you sit comfortably? unlimited    How long can you stand comfortably? 87mins    How long can you walk comfortably? 51mins    Diagnostic tests None    Patient Stated Goals To be able to use my RUE like I do my LUE    Pain Onset More than a month ago                    Ther-Ex Pulleys abd and flex 33mins each way with 5-10sec hold in max of each position each rep Attempted retrival of 8# DB from eye level shelf with difficulty, from chest height able to with cuing for sequencing Curl to overhead press 4# DB 3x 5 with cuing for technique with good carry over Lat pulldown 25# 3x 10 with min cuing initially for technique with good carry over Overhead doorway pec stretchx74min hold  Manual Scar massage x33mins; increased time spent on lateral part of scar at axilla Mobilization with movement into flex 26min, abd 61min and ER 56min AROM following Posterior mobilization G3 6bouts 30sec for increased mobility                  PT Education - 05/18/20 1716    Education Details therex form/technique    Person(s) Educated Patient    Methods Explanation;Demonstration;Verbal cues    Comprehension Verbalized understanding;Returned demonstration;Verbal cues required            PT Short Term Goals - 04/08/20 1811      PT SHORT TERM GOAL #1   Title Pt will be independent with HEP in order to improve strength and decrease pain in order to improve pain-free function at home and work.    Baseline 04/08/20 Completing HEP regularly 02/23/20 HEP given    Time 5    Period Weeks    Status Achieved      PT SHORT TERM GOAL #2   Title Patient will demonstrate full passive shoulder ROM in order to build full active motion needed for overhead tasks    Baseline 04/08/20 FLEX: 142D  Abd 113d 02/23/20 flex: 132d abd 102d    Time 5    Period Weeks    Status On-going             PT Long Term Goals - 04/08/20 1809      PT LONG TERM GOAL #1   Title  Patient will increase FOTO score to 59 to demonstrate predicted increase in functional mobility to complete ADLs    Baseline 04/08/20 70 02/23/20 38    Time 8    Period Weeks    Status Achieved      PT LONG TERM GOAL #2   Title Pt will increase gross periscapular strength to at least 4+/5 MMT grade in order to perform overhead household tasks    Baseline 04/08/20 R/L Lat  4+/4+  Y 3+/4 T 4+/4 I 4+/4+ 02/23/20 R/L Lat 4/4+  Y 2/4 T 3+/4 I 4/4+    Time 8    Period Weeks    Status On-going      PT LONG TERM GOAL #3   Title Patient will demonstrate full active R shoulder abd and flex in order to complete overhead tasks    Baseline 04/08/20 flex: 143d  abd: 102d 02/23/20 flex: 110d abd 90d    Time 8    Period Weeks    Status On-going      PT LONG TERM GOAL #4   Title Patient will be able to lift 8# DB onto a shelf at eye level to demonstrate ability to lift a gallon of milk to top shelf of a fridge    Baseline 04/08/20 able to complete but with heavy compensation of trunk lean and UT involvement 02/23/20 unable    Time 8    Period Weeks    Status On-going                 Plan - 05/18/20 1741    Clinical Impression Statement PT continued to utilize manual techniques for increased scar mobility and decreasing muscle tension with good carry over. PT continue to lead patietn throughout therex for increased strength and functional ADL movement with patient able to comply with all cuing for therex technique/form. PT will continue progression as able.    Personal Factors and Comorbidities Age;Comorbidity 1;Comorbidity 2;Fitness;Sex;Profession    Comorbidities HTN, breast cancer    Examination-Activity Limitations Bathing;Lift;Carry;Dressing;Reach Overhead    Examination-Participation Restrictions Laundry;Meal Prep;Yard Work;Community Activity;Driving    Stability/Clinical Decision Making Evolving/Moderate complexity    Clinical Decision Making Moderate    Rehab Potential Good    PT Frequency 2x /  week    PT Duration 8 weeks    PT Treatment/Interventions ADLs/Self Care Home Management;Electrical Stimulation;Therapeutic activities;Patient/family education;Taping;Joint Manipulations;Spinal Manipulations;Passive range of motion;Dry needling;Manual techniques;Functional mobility training;Ultrasound;Cryotherapy;Traction;Neuromuscular re-education;Therapeutic exercise;DME Instruction;Moist Heat;Iontophoresis 4mg /ml Dexamethasone    PT Next Visit Plan scar tissue massage    PT Home Exercise Plan [id number]    Consulted and Agree with Plan of Care Patient           Patient will benefit from skilled therapeutic intervention in order to improve the following deficits and impairments:  Increased fascial restricitons, Pain, Improper body mechanics, Decreased mobility, Decreased scar mobility, Impaired tone, Postural dysfunction, Decreased activity tolerance, Decreased endurance, Decreased range of motion, Decreased strength, Impaired UE functional use, Impaired flexibility, Increased edema  Visit Diagnosis: Chronic right shoulder pain  Stiffness of right shoulder, not elsewhere classified     Problem List Patient Active Problem List   Diagnosis Date Noted  . Well woman exam with routine gynecological exam 02/27/2020  . Intermittent tingling sensation of right hand and foot 02/26/2020  . Numbness and tingling in right hand 02/26/2020  . Hypokalemia 02/12/2020  . 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 Chemotherapy-induced neuropathy ([place]) 01/29/2019  . Pyuria 11/07/2018  . Urinary urgency 11/07/2018  . Hyperlipidemia associated with type 2 diabetes mellitus ([place]) 10/02/2018  . Neuropathy due to chemotherapeutic drug ([place]) 10/02/2018  . Myalgia due to statin 10/02/2018  . Controlled type 2 diabetes mellitus with hyperglycemia ([place]) 07/05/2018  . Diverticulosis of large intestine without diverticulitis 03/01/2018  . Personal history of chemotherapy 01/03/2018  . Peripheral neuropathy due to  chemotherapy ([place]) 07/04/2017  . Malignant neoplasm of upper-outer quadrant of right breast in female, estrogen receptor positive ([place]) 03/05/2017  .  Goals of care, counseling/discussion 03/05/2017  . Essential hypertension 01/05/2016   Durwin Reges DPT  Durwin Reges 05/18/2020, 5:47 PM  [facility] [address] [place], [address] Phone: [phone]   Fax:  [phone]  Name: Chelsea Hudson MRN: [id number] Date of Birth: Jan 12, 1966

## 2020-05-21 ENCOUNTER — Ambulatory Visit: Payer: Medicaid Other | Admitting: Family Medicine

## 2020-05-25 ENCOUNTER — Encounter: Payer: Self-pay | Admitting: Family Medicine

## 2020-05-25 ENCOUNTER — Other Ambulatory Visit: Payer: Self-pay

## 2020-05-25 ENCOUNTER — Ambulatory Visit: Payer: Medicaid Other | Admitting: Occupational Therapy

## 2020-05-25 ENCOUNTER — Ambulatory Visit: Payer: Medicaid Other | Admitting: Family Medicine

## 2020-05-25 ENCOUNTER — Ambulatory Visit: Payer: Medicaid Other | Attending: Oncology | Admitting: Physical Therapy

## 2020-05-25 ENCOUNTER — Encounter: Payer: Self-pay | Admitting: Physical Therapy

## 2020-05-25 VITALS — BP 126/69 | HR 102 | Temp 97.7°F | Ht 66.0 in | Wt 255.0 lb

## 2020-05-25 DIAGNOSIS — L905 Scar conditions and fibrosis of skin: Secondary | ICD-10-CM

## 2020-05-25 DIAGNOSIS — M25611 Stiffness of right shoulder, not elsewhere classified: Secondary | ICD-10-CM | POA: Insufficient documentation

## 2020-05-25 DIAGNOSIS — M25562 Pain in left knee: Secondary | ICD-10-CM | POA: Diagnosis not present

## 2020-05-25 DIAGNOSIS — M25561 Pain in right knee: Secondary | ICD-10-CM | POA: Insufficient documentation

## 2020-05-25 DIAGNOSIS — G8929 Other chronic pain: Secondary | ICD-10-CM | POA: Insufficient documentation

## 2020-05-25 DIAGNOSIS — E1165 Type 2 diabetes mellitus with hyperglycemia: Secondary | ICD-10-CM | POA: Diagnosis not present

## 2020-05-25 DIAGNOSIS — M79601 Pain in right arm: Secondary | ICD-10-CM | POA: Insufficient documentation

## 2020-05-25 DIAGNOSIS — M791 Myalgia, unspecified site: Secondary | ICD-10-CM | POA: Diagnosis not present

## 2020-05-25 DIAGNOSIS — M25511 Pain in right shoulder: Secondary | ICD-10-CM | POA: Insufficient documentation

## 2020-05-25 DIAGNOSIS — I972 Postmastectomy lymphedema syndrome: Secondary | ICD-10-CM | POA: Insufficient documentation

## 2020-05-25 LAB — POCT GLYCOSYLATED HEMOGLOBIN (HGB A1C): Hemoglobin A1C: 7.2 % — AB (ref 4.0–5.6)

## 2020-05-25 MED ORDER — DICLOFENAC SODIUM 1 % EX GEL: 2.0000 g | Freq: Four times a day (QID) | CUTANEOUS | 1 refills | Status: DC

## 2020-05-25 NOTE — Assessment & Plan Note (Signed)
ControlledDM with A1c 7.2% Worsening control from 6.9& on 02/17/2020 and goal A1c < 7.0%. - Complications - Morbid obesity, hypertension.  Plan:  1. Continue current therapy: metformin 1000mg  with breakfast and 500mg  with dinner 2. Encourage improved lifestyle: - low carb/low glycemic diet reinforced prior education - Increase physical activity to 30 minutes most days of the week.  Explained that increased physical activity increases body's use of sugar for energy. 3. Encouraged to check CBG.  Patient with fear of needles. 4. Follow-up 3 months

## 2020-05-25 NOTE — Assessment & Plan Note (Signed)
Chronic bilateral knee pain that has been progressing over the past year.  Reports worsening in the last month.  No trauma, fall, accident or injury known.  Reports worsening of symptoms with walking and prolonged standing.  Plan: 1. Begin diclofenac topical gel up to 4x per day for knee pain 2. Referral to physical therapy placed 3. Referral to orthopedics placed

## 2020-05-25 NOTE — Therapy (Signed)
[facility] [address], [place], [address] Phone: [phone]   Fax:  [phone]  Occupational Therapy Screen  Patient Details  Name: Chelsea Hudson MRN: [id number] Date of Birth: November 29, 1965 Referring Provider (OT): Faythe Casa   Encounter Date: 05/25/2020   OT End of Session - 05/25/20 0933    Visit Number 0           Past Medical History:  Diagnosis Date  . Arthritis    SHOULDER  . Breast cancer ([place]) 02/2017   rt breast  . Cancer ([place]) 02/28/2017   INVASIVE MAMMARY CARCINOMA WITH MUCINOUS FEATURES.  . Colonic diverticular abscess 06/21/2017   Colonoscopy 06/25/2017: No evidence of malignancy.  . Diabetes mellitus without complication ([place])   . Hypertension   . Irregular heart beat    PT STATES IT "SKIPS A BEAT"   . Obesity   . Personal history of chemotherapy   . Personal history of radiation therapy     Past Surgical History:  Procedure Laterality Date  . ANKLE SURGERY    . BREAST BIOPSY Right 02/28/2017   INVASIVE MAMMARY CARCINOMA WITH MUCINOUS FEATURES.  Marland Kitchen BREAST CYST ASPIRATION Right    NEG  . COLONOSCOPY WITH PROPOFOL N/A 06/25/2017   Procedure: COLONOSCOPY WITH PROPOFOL;  Surgeon: Jonathon Bellows, MD;  Location: [facility] ENDOSCOPY;  Service: Gastroenterology;  Laterality: N/A;  . MASTECTOMY, PARTIAL Right 07/30/2017   Procedure: MASTECTOMY PARTIAL;  Surgeon: Robert Bellow, MD;  Location: [facility] ORS;  Service: General;  Laterality: Right;  . PORTACATH PLACEMENT Left 03/15/2017   Procedure: INSERTION PORT-A-CATH;  Surgeon: Robert Bellow, MD;  Location: [facility] ORS;  Service: General;  Laterality: Left;  . RE-EXCISION OF BREAST LUMPECTOMY Right 08/17/2017    INVASIVE CARCINOMA EXTENDS TO NEW LATERAL MARGIN. /RE-EXCISION OF BREAST LUMPECTOMY;: Byrnett, Forest Gleason, MD;  [facility] ORS; General;  Laterality: Right;  . SENTINEL NODE BIOPSY Right 07/30/2017   Procedure: SENTINEL NODE BIOPSY;   Surgeon: Robert Bellow, MD;  Location: [facility] ORS;  Service: General;  Laterality: Right;  . SIMPLE MASTECTOMY WITH AXILLARY SENTINEL NODE BIOPSY Right 09/28/2017   Procedure: SIMPLE MASTECTOMY;  Surgeon: Robert Bellow, MD;  Location: [facility] ORS;  Service: General;  Laterality: Right;    There were no vitals filed for this visit.   Subjective Assessment - 05/25/20 0932    Subjective  My husband is not there to help my with putting my jovipak on under my bra in the back - stay swollen under my arm -and my glove is so tight    Pertinent History R breast CA in 22018/2019 with mastectomy -and about 15 axillary ln removed , had chemo that resulted in neuropathy but stable with gabapentin; Radation 2019 - and report having about year swelling in her R arm and under her arm - increase numbness/pins and needles the last 6 months to year , decrease shoulder AROM over head - cannot hold arm up - rest her hand on her head    Patient Stated Goals I want the swelling and numbness better in my arm , not to get worse - and pain in arm better so I can reach over head    Currently in Pain? No/denies               LYMPHEDEMA/ONCOLOGY QUESTIONNAIRE - 05/25/20 0001      Right Upper Extremity Lymphedema   15 cm Proximal to Olecranon Process 44.2 cm  10 cm Proximal to Olecranon Process 40.6 cm    Olecranon Process 32 cm    15 cm Proximal to Ulnar Styloid Process 28 cm    10 cm Proximal to Ulnar Styloid Process 23.5 cm    Just Proximal to Ulnar Styloid Process 19.5 cm    Across Hand at [employer] 20.8 cm    At [place] of 2nd Digit 6.5 cm    At [facility] of Thumb 6.7 cm           She arrive with her Jobst Elvarex soft sleeve and glove on - wearing correctly -report her glove feels tight over the top of hand - pt ed on washing and stretching it 2-3 x - Reps suggested that before -  Circumference this date increase in proximal forearm and both upper arm measurements ( upper arm increase 2 to 3  cm Report that her she is only wearing about 25% of time her glove  Pt ed on need to wear sleeve and glove( pulling and donning sleeve correctly to correct height)      She report her husband cannot help her putting in her  jovipak unilateral breast pad  In her bra or camisole- mom moved out - pt with large pocket of lymphedema on R thoracic and under arm  Causing congestion in R upper quadrant and will explain increase upper arm circumference  Would recommend lymphedema pump with trunk piece to decongest  lymphedema proximal -   Assess her night time Jubilee compression sleeve with power sleeve - appear to fit well -had been wearing if for few months - report working well - hand down in the am - but increase during day   Plan to contact Rep to order pt pump with trunk attachment to clear thoracic under arm              OT Education - 05/25/20 0933    Education Details garments wearing and HEP    Person(s) Educated Patient    Methods Explanation;Demonstration;Tactile cues;Verbal cues;Handout    Comprehension Verbal cues required;Returned demonstration;Verbalized understanding            OT Short Term Goals - 02/03/20 1227      OT SHORT TERM GOAL #1   Title R UE circumference decrease in upper arm by 1-2 cm to get measured for day and night time compression garments    Baseline see flowsheet - R UE decrease at all levels - await custom sleeves to come in    Time 2    Period Weeks    Status Achieved             OT Long Term Goals - 02/03/20 1228      OT LONG TERM GOAL #1   Title Pt to  be independent in use of daytime and night time compression garments to decrease and maintain circumference of R UE    Baseline only has jovipak unilateral post mastectomy pad , and Jobst Elvarex soft sleeve and glove - but had it wrong on - and upper arms numbers were increase , await night time compression sleeve still - wil contacte DME    Time 5    Period Weeks    Status On-going     [place] Date 03/09/20      OT LONG TERM GOAL #2   Title Pt show increase R Shoulder AROM  by 20 degrees at least to be able to donn compression garments and decrease upper traps  pain    Baseline EVALpain increase to 9/10 in R UE , increase numbness, shoulder ABD 100 , upper traps tightness , limited by scar tissue ; NOW pain and numbness  - after using her arm alot, cleaning the house, or over head , AROM increase to 140 for shoulder flexion and ABD , upper traps pain decrease but still tight - refer to PT for eval and tx    Status Deferred                  Patient will benefit from skilled therapeutic intervention in order to improve the following deficits and impairments:           Visit Diagnosis: Postmastectomy lymphedema syndrome    Problem List Patient Active Problem List   Diagnosis Date Noted  . Well woman exam with routine gynecological exam 02/27/2020  . Intermittent tingling sensation of right hand and foot 02/26/2020  . Numbness and tingling in right hand 02/26/2020  . Hypokalemia 02/12/2020  . 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 Chemotherapy-induced neuropathy ([place]) 01/29/2019  . Pyuria 11/07/2018  . Urinary urgency 11/07/2018  . Hyperlipidemia associated with type 2 diabetes mellitus ([place]) 10/02/2018  . Neuropathy due to chemotherapeutic drug ([place]) 10/02/2018  . Myalgia due to statin 10/02/2018  . Controlled type 2 diabetes mellitus with hyperglycemia ([place]) 07/05/2018  . Diverticulosis of large intestine without diverticulitis 03/01/2018  . Personal history of chemotherapy 01/03/2018  . Peripheral neuropathy due to chemotherapy ([place]) 07/04/2017  . Malignant neoplasm of upper-outer quadrant of right breast in female, estrogen receptor positive ([place]) 03/05/2017  . Goals of care, counseling/discussion 03/05/2017  . Essential hypertension 01/05/2016    Rosalyn Gess OTR/L,CLT 05/25/2020, 9:34 AM  [facility] [address] [place], [address] Phone: [phone]   Fax:  [phone]  Name: Chelsea Hudson MRN: [id number] Date of Birth: 09-21-1966

## 2020-05-25 NOTE — Therapy (Signed)
[facility] [address], [place], [address] Phone: [phone]   Fax:  [phone]  Physical Therapy Treatment/Discharge Summary Reporting Period 04/19/31 - 05/25/20  Patient Details  Name: Chelsea Hudson MRN: [id number] Date of Birth: 1965-12-18 No data recorded  Encounter Date: 05/25/2020    Past Medical History:  Diagnosis Date  . Arthritis    SHOULDER  . Breast cancer ([place]) 02/2017   rt breast  . Cancer ([place]) 02/28/2017   INVASIVE MAMMARY CARCINOMA WITH MUCINOUS FEATURES.  . Colonic diverticular abscess 06/21/2017   Colonoscopy 06/25/2017: No evidence of malignancy.  . Diabetes mellitus without complication ([place])   . Hypertension   . Irregular heart beat    PT STATES IT "SKIPS A BEAT"   . Obesity   . Personal history of chemotherapy   . Personal history of radiation therapy     Past Surgical History:  Procedure Laterality Date  . ANKLE SURGERY    . BREAST BIOPSY Right 02/28/2017   INVASIVE MAMMARY CARCINOMA WITH MUCINOUS FEATURES.  Marland Kitchen BREAST CYST ASPIRATION Right    NEG  . COLONOSCOPY WITH PROPOFOL N/A 06/25/2017   Procedure: COLONOSCOPY WITH PROPOFOL;  Surgeon: Jonathon Bellows, MD;  [facility] ENDOSCOPY;  Service: Gastroenterology;  Laterality: N/A;  . MASTECTOMY, PARTIAL Right 07/30/2017   Procedure: MASTECTOMY PARTIAL;  Surgeon: Robert Bellow, MD;  Location: [facility] ORS;  Service: General;  Laterality: Right;  . PORTACATH PLACEMENT Left 03/15/2017   Procedure: INSERTION PORT-A-CATH;  Surgeon: Robert Bellow, MD;  Location: [facility] ORS;  Service: General;  Laterality: Left;  . RE-EXCISION OF BREAST LUMPECTOMY Right 08/17/2017    INVASIVE CARCINOMA EXTENDS TO NEW LATERAL MARGIN. /RE-EXCISION OF BREAST LUMPECTOMY;: Byrnett, Forest Gleason, MD;  [facility] ORS; General;  Laterality: Right;  . SENTINEL NODE BIOPSY Right 07/30/2017   Procedure: SENTINEL NODE BIOPSY;  Surgeon: Robert Bellow, MD;  Location:  [facility] ORS;  Service: General;  Laterality: Right;  . SIMPLE MASTECTOMY WITH AXILLARY SENTINEL NODE BIOPSY Right 09/28/2017   Procedure: SIMPLE MASTECTOMY;  Surgeon: Robert Bellow, MD;  Location: [facility] ORS;  Service: General;  Laterality: Right;    There were no vitals filed for this visit.   Subjective Assessment - 05/25/20 1119    Subjective Reports she feels 70% better overall, mostly limited by overhead strength at this point.    Pertinent History Pt is a 54 year old female s/p masectomy w/ lymphectomy in 2019, following chemo and radiation treatment for R breast cancer diagnosis. Has had lymphadema since beginning of 2020, and has seen improvement for this following OT treatment here since Dec 2020. She is being seen by PT today d/t progressive R shoulder weakness and decrease of motion over the past year d/t scar tissue, and lymphadema. 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 She reports difficulty with all overhead motion, lifting (reports she cannot lift a gallon of milk with RUE), and can only drive with hands at lower part of wheel. Reports occasional tension/dull pain in the shoulder/neck pain that is random in nature, but usually comes on at the end of the day when she has used the UE often during the day. Worst pain over the past week: 7/10 best 0/10. Pt denies N/V, B&B changes, unexplained weight fluctuation, saddle paresthesia, fever, night sweats, or unrelenting night pain at this time    Limitations Lifting;House hold activities    How long can you sit comfortably? unlimited  How long can you stand comfortably? 53mns    How long can you walk comfortably? 176ms    Diagnostic tests None    Patient Stated Goals To be able to use my RUE like I do my LUE    Pain Onset More than a month ago           Ther-Ex ROM and MMT Review Education on strengthening to aid in pump action of lymphedema, but not to aggravate this PT reviewed the following HEP with  patient with patient able to demonstrate a set of the following with min cuing for correction needed. PT educated patient on parameters of therex (how/when to inc/decrease intensity, frequency, rep/set range, stretch hold time, and purpose of therex) with verbalized understanding.   Access Code: [email or web address] Shoulder Abduction AAROM with Pulley Behind - 1 x daily - 7 x weekly - 20 reps Seated Shoulder Flexion AAROM with Pulley Behind - 1 x daily - 7 x weekly - 20 reps Doorway Pec Stretch at 90 Degrees Abduction - 2 x daily - 7 x weekly - 60sec hold Standing Row with Anchored Resistance - 1 x daily - 3 x weekly - 3 sets - 10 reps Standing High Row with Resistance - 1 x daily - 3 x weekly - 3 sets - 5-10 reps Standing Shoulder Scaption with Resistance - 1 x daily - 3 x weekly - 3 sets - 5-10 reps Prone Scapular Retraction Y - 1 x daily - 3 x weekly - 3 sets - 5-10 reps Shoulder External Rotation and Scapular Retraction with Resistance - 1 x daily - 3 x weekly - 3 sets - 5-10 reps                            PT Short Term Goals - 04/08/20 1811      PT SHORT TERM GOAL #1   Title Pt will be independent with HEP in order to improve strength and decrease pain in order to improve pain-free function at home and work.    Baseline 04/08/20 Completing HEP regularly 02/23/20 HEP given    Time 5    Period Weeks    Status Achieved      PT SHORT TERM GOAL #2   Title Patient will demonstrate full passive shoulder ROM in order to build full active motion needed for overhead tasks    Baseline 04/08/20 FLEX: 142D  Abd 113d 02/23/20 flex: 132d abd 102d    Time 5    Period Weeks    Status On-going             PT Long Term Goals - 05/25/20 1120      PT LONG TERM GOAL #1   Title Patient will increase FOTO score to 59 to demonstrate predicted increase in functional mobility to complete ADLs    Baseline 04/08/20 70 02/23/20 38    Time 8    Period Weeks    Status Achieved      PT LONG  TERM GOAL #2   Title Pt will increase gross periscapular strength to at least 4+/5 MMT grade in order to perform overhead household tasks    Baseline 04/08/20 R/L Lat 5/5  Y 4+/4+ T 4+/4+ I 5/5 04/08/20 R/L Lat 4+/4+  Y 3+/4 T 4+/4 I 4+/4+ 02/23/20 R/L Lat 4/4+  Y 2/4 T 3+/4 I 4/4+    Time 8    Period Weeks    Status Achieved  PT LONG TERM GOAL #3   Title Patient will demonstrate full active R shoulder abd and flex in order to complete overhead tasks    Baseline 05/25/20 flex: 167d  abd: 137d IR T7 ER C7 02/23/20 flex: 110d abd 90d 04/08/20 flex: 143d  abd: 102d 02/23/20 flex: 110d abd 90d    Time 8    Period Weeks    Status Partially Met      PT LONG TERM GOAL #4   Title Patient will be able to lift 8# DB onto a shelf at eye level to demonstrate ability to lift a gallon of milk to top shelf of a fridge    Baseline 05/25/20 able to complete with slow speed 04/08/20 able to complete but with heavy compensation of trunk lean and UT involvement 02/23/20 unable    Time 8    Period Weeks    Status Achieved                 Plan - 05/25/20 1243    Clinical Impression Statement PT reassessed goals this session where patient has met all goals to safely d/c PT to robust HEP. Patient is able to demonstrate good carry over and understanding of HEP recommendations. Pt given clinic    Personal Factors and Comorbidities Age;Comorbidity 1;Comorbidity 2;Fitness;Sex;Profession    Comorbidities HTN, breast cancer    Examination-Activity Limitations Bathing;Lift;Carry;Dressing;Reach Overhead    Examination-Participation Restrictions Laundry;Meal Prep;Yard Work;Community Activity;Driving    Stability/Clinical Decision Making Evolving/Moderate complexity    Clinical Decision Making Moderate    Rehab Potential Good    PT Frequency 2x / week    PT Duration 8 weeks    PT Treatment/Interventions ADLs/Self Care Home Management;Electrical Stimulation;Therapeutic activities;Patient/family education;Taping;Joint  Manipulations;Spinal Manipulations;Passive range of motion;Dry needling;Manual techniques;Functional mobility training;Ultrasound;Cryotherapy;Traction;Neuromuscular re-education;Therapeutic exercise;DME Instruction;Moist Heat;Iontophoresis 35m/ml Dexamethasone    PT Next Visit Plan scar tissue massage    PT Home Exercise Plan [id number]    Consulted and Agree with Plan of Care Patient           Patient will benefit from skilled therapeutic intervention in order to improve the following deficits and impairments:  Increased fascial restricitons, Pain, Improper body mechanics, Decreased mobility, Decreased scar mobility, Impaired tone, Postural dysfunction, Decreased activity tolerance, Decreased endurance, Decreased range of motion, Decreased strength, Impaired UE functional use, Impaired flexibility, Increased edema  Visit Diagnosis: Chronic right shoulder pain  Stiffness of right shoulder, not elsewhere classified  Scar condition and fibrosis of skin  Pain in right arm     Problem List Patient Active Problem List   Diagnosis Date Noted  . Well woman exam with routine gynecological exam 02/27/2020  . Intermittent tingling sensation of right hand and foot 02/26/2020  . Numbness and tingling in right hand 02/26/2020  . Hypokalemia 02/12/2020  . 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 Chemotherapy-induced neuropathy ([place] 01/29/2019  . Pyuria 11/07/2018  . Urinary urgency 11/07/2018  . Hyperlipidemia associated with type 2 diabetes mellitus ([place] 10/02/2018  . Neuropathy due to chemotherapeutic drug ([place] 10/02/2018  . Myalgia due to statin 10/02/2018  . Controlled type 2 diabetes mellitus with hyperglycemia ([place] 07/05/2018  . Diverticulosis of large intestine without diverticulitis 03/01/2018  . Personal history of chemotherapy 01/03/2018  . Peripheral neuropathy due to chemotherapy ([place] 07/04/2017  . Malignant neoplasm of upper-outer quadrant of right breast in female, estrogen  receptor positive ([place] 03/05/2017  . Goals of care, counseling/discussion 03/05/2017  . Essential hypertension 01/05/2016   CDurwin RegesDPT CDurwin Reges7/04/2020, 12:51  PM  [facility] [address] [place], [address] Phone: [phone]   Fax:  [phone]  Name: Chelsea Hudson MRN: [id number] Date of Birth: 1966-02-03

## 2020-05-25 NOTE — Progress Notes (Signed)
Subjective:    Patient ID: Chelsea Hudson, female    DOB: 1966-08-07, 55 y.o.   MRN: [id number]  Chelsea Hudson is a 54 y.o. female presenting on 05/25/2020 for Knee Pain (bilateral knee pain  that worsen over the last year. Pain worsen with standing and walking. ), Cramps (leg, hands and torso cramps that she associates with her Lipitor ), and Diabetes   HPI   Diabetes Pt presents today for follow up Type 2 Diabetes Mellitus.  He/she (caps): She ACTION; IS/IS NOT: is not checking AM CBG at home. -Current diabetic medications include: metformin 1000mg  with breakfast and 500mg  with dinner -ACTION; IS/IS NOT: is not currently symptomatic -Actions; denies/reports/admits to: denies polydipsia, polyphagia, polyuria, headaches, diaphoresis, shakiness, chills, pain, numbness or tingling in extremities or changes in vision -Clinical course has been stable -Reports no exercise routine -Diet is high in salt, high in fat, and high in carbohydrates  PREVENTION Eye exam current (within 1 year) Due, discussed Foot exam current (within 1 year) Up to date Lipid/ASCVD risk reduction - on statin: YES/NO: Unable to tolerate statin due to myalgia Kidney Protection (On ACE/ARB)? YES/NO: Was on lisinopril, has declined to continue this  [name] has concerns for bilateral knee pain that has progressed over the past year.  Reports worsening in the past month.  Denies injury, trauma, or fall with knees.  Has had worsening of symptoms with prolonged standing and walking.  Has not taken anything for her symptoms in the past.  Denies numbness, tingling, weakness, knee locking/catching, falls.  Has concerns for muscle myalgias with her atorvastatin.  Reports she has been getting cramps in her hands, flank and both legs in the evenings.  Reports this had happened in the past as well and looking to discontinue medication.  Depression screen [facility] 2/9 12/03/2019 09/26/2018 05/24/2018  Decreased Interest 0 0 0  Down,  Depressed, Hopeless 0 0 0  PHQ - 2 Score 0 0 0  Altered sleeping - - -  Tired, decreased energy - - -  Change in appetite - - -  Feeling bad or failure about yourself  - - -  Trouble concentrating - - -  Moving slowly or fidgety/restless - - -  Suicidal thoughts - - -  PHQ-9 Score - - -  Some recent data might be hidden    Social History   Tobacco Use  . Smoking status: Former Smoker    Packs/day: 0.25    Years: 25.00    Pack years: 6.25    Types: Cigarettes    Quit date: 10/20/2017    Years since quitting: 2.5  . Smokeless tobacco: Former Network engineer  . Vaping Use: Never used  Substance Use Topics  . Alcohol use: Yes    Comment: occas  . Drug use: No    Review of Systems  Constitutional: Negative.   HENT: Negative.   Eyes: Negative.   Respiratory: Negative.   Cardiovascular: Negative.   Gastrointestinal: Negative.   Endocrine: Negative.   Genitourinary: Negative.   Musculoskeletal: Positive for arthralgias. Negative for back pain, gait problem, joint swelling, myalgias, neck pain and neck stiffness.  Skin: Negative.   Allergic/Immunologic: Negative.   Neurological: Negative.   Hematological: Negative.   Psychiatric/Behavioral: Negative.    Per HPI unless specifically indicated above     Objective:    BP 126/69 (BP Location: Left Arm, Patient Position: Sitting, Cuff Size: Large)   Pulse (!) 102   Temp 97.7 F (  36.5 C) (Temporal)   Ht 5\' 6"  (1.676 m)   Wt 255 lb (115.7 kg)   LMP 01/31/2014 (Approximate) Comment: LMP was 3 years ago.  BMI 41.16 kg/m   Wt Readings from Last 3 Encounters:  05/25/20 255 lb (115.7 kg)  03/30/20 251 lb (113.9 kg)  02/27/20 246 lb (111.6 kg)    Physical Exam Vitals reviewed.  Constitutional:      General: She is not in acute distress.    Appearance: Normal appearance. She is well-developed and well-groomed. She is morbidly obese. She is not ill-appearing or toxic-appearing.  HENT:     Head: Normocephalic and  atraumatic.     Nose:     Comments: [name] is in place, covering mouth and nose. Eyes:     General: Lids are normal. Vision grossly intact.        Right eye: No discharge.        Left eye: No discharge.     Extraocular Movements: Extraocular movements intact.     Conjunctiva/sclera: Conjunctivae normal.     Pupils: Pupils are equal, round, and reactive to light.  Cardiovascular:     Rate and Rhythm: Normal rate and regular rhythm.     Pulses: Normal pulses.          Dorsalis pedis pulses are 2+ on the right side and 2+ on the left side.     Heart sounds: Normal heart sounds. No murmur heard.  No friction rub. No gallop.   Pulmonary:     Effort: Pulmonary effort is normal. No respiratory distress.     Breath sounds: Normal breath sounds.  Musculoskeletal:        General: Swelling present. No tenderness. Normal range of motion.     Right knee: Swelling present. No deformity, effusion, erythema, ecchymosis, lacerations or crepitus. Normal range of motion. No tenderness. Normal alignment. Normal pulse.     Left knee: Swelling present. No deformity, effusion, erythema, ecchymosis, lacerations or crepitus. Normal range of motion. No tenderness. Normal alignment. Normal pulse.     Right lower leg: No edema.     Left lower leg: No edema.     Comments: Normal tone, 5/5 strength BLE  Skin:    General: Skin is warm and dry.     Capillary Refill: Capillary refill takes less than 2 seconds.  Neurological:     General: No focal deficit present.     Mental Status: She is alert and oriented to person, place, and time.  Psychiatric:        Attention and Perception: Attention and perception normal.        Mood and Affect: Mood and affect normal.        Speech: Speech normal.        Behavior: Behavior normal. Behavior is cooperative.        Thought Content: Thought content normal.        Cognition and Memory: Cognition and memory normal.        Judgment: Judgment normal.    Results for  orders placed or performed in visit on 05/25/20  POCT glycosylated hemoglobin (Hb A1C)  Result Value Ref Range   Hemoglobin A1C 7.2 (A) 4.0 - 5.6 %   HbA1c POC (<> result, manual entry)     HbA1c, POC (prediabetic range)     HbA1c, POC (controlled diabetic range)        Assessment & Plan:   Problem List Items Addressed This Visit  Endocrine   Controlled type 2 diabetes mellitus with hyperglycemia ([place]) - Primary    ControlledDM with A1c 7.2% Worsening control from 6.9& on 02/17/2020 and goal A1c < 7.0%. - Complications - Morbid obesity, hypertension.  Plan:  1. Continue current therapy: metformin 1000mg  with breakfast and 500mg  with dinner 2. Encourage improved lifestyle: - low carb/low glycemic diet reinforced prior education - Increase physical activity to 30 minutes most days of the week.  Explained that increased physical activity increases body's use of sugar for energy. 3. Encouraged to check CBG.  Patient with fear of needles. 4. Follow-up 3 months        Relevant Orders   POCT glycosylated hemoglobin (Hb A1C) (Completed)     Other   Myalgia due to statin    Reports cramping in hands, flank and throughout legs in the evenings.  Requesting to stop atorvastatin.  Plan: 1. Stop atorvastatin due to myalgia 2. Work on reducing dietary cholesterol intake and increasing exercise as tolerated 3. Follow up for repeat labs in 3 months      Knee pain, bilateral    Chronic bilateral knee pain that has been progressing over the past year.  Reports worsening in the last month.  No trauma, fall, accident or injury known.  Reports worsening of symptoms with walking and prolonged standing.  Plan: 1. Begin diclofenac topical gel up to 4x per day for knee pain 2. Referral to physical therapy placed 3. Referral to orthopedics placed      Relevant Medications   diclofenac Sodium (VOLTAREN) 1 % GEL   Other Relevant Orders   AMB referral to orthopedics   Ambulatory referral  to Physical Therapy      Meds ordered this encounter  Medications  . diclofenac Sodium (VOLTAREN) 1 % GEL    Sig: Apply 2 g topically 4 (four) times daily.    Dispense:  100 g    Refill:  1      Follow up plan: Return in about 3 months (around 08/25/2020) for T2DM, A1C F/U.   Harlin Rain, [place] Family Nurse Practitioner [facility] 05/25/2020, 2:50 PM

## 2020-05-25 NOTE — Patient Instructions (Signed)
Your medication refills have been sent to your pharmacy on file.  I have sent in a prescription for Diclofenac Gel to apply topically to both knees up to 4x a day as needed for discomfort.  I have put in a referral to physical therapy for chronic bilateral knee pain.  You should hear from them within 1 week, if you do not, please contact our office and I will follow up with them.  We will plan to see you back in 3 months for diabetes follow up visit  You will receive a survey after today's visit either digitally by e-mail or paper by [place] mail. Your experiences and feedback matter to [place].  Please respond so we know how we are doing as we provide care for you.  Call us with any questions/concerns/needs.  It is my goal to be available to you for your health concerns.  Thanks for choosing me to be a partner in your healthcare needs!  Harlin Rain, FNP-C Family Nurse Practitioner [facility] Phone: [phone]

## 2020-05-25 NOTE — Assessment & Plan Note (Signed)
Reports cramping in hands, flank and throughout legs in the evenings.  Requesting to stop atorvastatin.  Plan: 1. Stop atorvastatin due to myalgia 2. Work on reducing dietary cholesterol intake and increasing exercise as tolerated 3. Follow up for repeat labs in 3 months

## 2020-05-27 ENCOUNTER — Telehealth: Payer: Self-pay

## 2020-05-27 NOTE — Telephone Encounter (Signed)
Copied from [place] [phone]. Topic: General - Other >> May 27, 2020  2:25 PM Celene Kras wrote: Reason for CRM: Pt called and is requesting to speak with nurse or PCP regarding her continuing to experience cramping. Pt states that she is needing to have clarification on the medication that is causing her to cramp as well. Please advise.   I informed the patient that Elmyra Ricks stated in her last visit to stop the medication because she complained of   Reports cramping in hands, flank and throughout legs in the evenings.  Requesting to stop atorvastatin.  Plan: 1. Stop atorvastatin due to myalgia 2. Work on reducing dietary cholesterol intake and increasing exercise as tolerated 3. Follow up for repeat labs in 3 months

## 2020-06-01 ENCOUNTER — Ambulatory Visit: Payer: Medicaid Other | Admitting: Family Medicine

## 2020-06-30 ENCOUNTER — Encounter: Payer: Self-pay | Admitting: Obstetrics and Gynecology

## 2020-06-30 ENCOUNTER — Ambulatory Visit (INDEPENDENT_AMBULATORY_CARE_PROVIDER_SITE_OTHER): Payer: Medicaid Other | Admitting: Obstetrics and Gynecology

## 2020-06-30 DIAGNOSIS — Z6841 Body Mass Index (BMI) 40.0 and over, adult: Secondary | ICD-10-CM

## 2020-06-30 DIAGNOSIS — Z853 Personal history of malignant neoplasm of breast: Secondary | ICD-10-CM | POA: Diagnosis not present

## 2020-06-30 DIAGNOSIS — I10 Essential (primary) hypertension: Secondary | ICD-10-CM | POA: Diagnosis not present

## 2020-06-30 DIAGNOSIS — E1165 Type 2 diabetes mellitus with hyperglycemia: Secondary | ICD-10-CM | POA: Diagnosis not present

## 2020-06-30 NOTE — Progress Notes (Signed)
GYNECOLOGY CLINIC PROGRESS NOTE Subjective:     Chelsea Hudson is a 54 y.o. G0P0 female here for discussion regarding weight loss  She has a PMH significant for right breast cancer s/p lumpectomy, DM, and HTN. She has noted a weight gain of approximately 20-25 pounds over the last 1 year. She feels ideal weight is 175-180 pounds. Weight at graduation from high school was 190 pounds. History of eating disorders: none. There is a family history positive for obesity in the patient, mother, father and sister. Previous treatments for obesity include prescription appetite suppressants: The patient cannot recall past treatments for weight loss, but remembers taking medications and self-directed dieting. Obesity associated medical conditions: diabetes mellitus and hypertension. Obesity associated medications: none. Cardiovascular risk factors besides obesity: diabetes mellitus, hypertension and sedentary lifestyle. 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 She states this past year with the pandemic has been challenging, and she gained back all the weight she had previously lost with past weight loss endeavors. She is ready to begin another weight loss plan at this time.   She was last seen at [facility] in 2017. Was seen by Lorelle Gibbs, CNM.  Notes being placed on Phentermine.  However also states this was prior to her diagnosis of DM and HTN.  She was recently started on Glucophage ~ 3-4 months ago. She does endorse ~ 9 lb weight loss since starting the medication.  Would like to try weight loss medications again.   Past Medical History:  Diagnosis Date  . Arthritis    SHOULDER  . Breast cancer ([place]) 02/2017   rt breast  . Breast cancer ([place]) 2018  . Cancer ([place]) 02/28/2017   INVASIVE MAMMARY CARCINOMA WITH MUCINOUS FEATURES.  . Colonic diverticular abscess 06/21/2017   Colonoscopy 06/25/2017: No evidence of malignancy.  . Diabetes mellitus without  complication ([place])   . Hypertension   . Irregular heart beat    PT STATES IT "SKIPS A BEAT"   . Obesity   . Personal history of chemotherapy   . Personal history of radiation therapy     Family History  Problem Relation Age of Onset  . Diabetes Father   . Stroke Father   . Hypertension Father   . Hypertension Mother   . Brain cancer Maternal Aunt 60  . Colon cancer Neg Hx     Past Surgical History:  Procedure Laterality Date  . ANKLE SURGERY    . BREAST BIOPSY Right 02/28/2017   INVASIVE MAMMARY CARCINOMA WITH MUCINOUS FEATURES.  Marland Kitchen BREAST CYST ASPIRATION Right    NEG  . COLONOSCOPY WITH PROPOFOL N/A 06/25/2017   Procedure: COLONOSCOPY WITH PROPOFOL;  Surgeon: Jonathon Bellows, MD;  Location: [facility] ENDOSCOPY;  Service: Gastroenterology;  Laterality: N/A;  . MASTECTOMY, PARTIAL Right 07/30/2017   Procedure: MASTECTOMY PARTIAL;  Surgeon: Robert Bellow, MD;  Location: [facility] ORS;  Service: General;  Laterality: Right;  . PORTACATH PLACEMENT Left 03/15/2017   Procedure: INSERTION PORT-A-CATH;  Surgeon: Robert Bellow, MD;  Location: [facility] ORS;  Service: General;  Laterality: Left;  . RE-EXCISION OF BREAST LUMPECTOMY Right 08/17/2017    INVASIVE CARCINOMA EXTENDS TO NEW LATERAL MARGIN. /RE-EXCISION OF BREAST LUMPECTOMY;: Byrnett, Forest Gleason, MD;  [facility] ORS; General;  Laterality: Right;  . SENTINEL NODE BIOPSY Right 07/30/2017   Procedure: SENTINEL NODE BIOPSY;  Surgeon: Robert Bellow, MD;  Location: [facility] ORS;  Service: General;  Laterality: Right;  . SIMPLE  MASTECTOMY WITH AXILLARY SENTINEL NODE BIOPSY Right 09/28/2017   Procedure: SIMPLE MASTECTOMY;  Surgeon: Earline Mayotte, MD;  Location: [facility] ORS;  Service: General;  Laterality: Right;    Social History   Socioeconomic History  . Marital status: Married    Spouse name: Not on file  . Number of children: Not on file  . Years of education: Not on file  . Highest education level: Not on file  Occupational History  . Not on  file  Tobacco Use  . Smoking status: Former Smoker    Packs/day: 0.25    Years: 25.00    Pack years: 6.25    Types: Cigarettes    Quit date: 10/20/2017    Years since quitting: 2.6  . Smokeless tobacco: Former Clinical biochemist  . Vaping Use: Never used  Substance and Sexual Activity  . Alcohol use: Not Currently    Comment: occas  . Drug use: No  . Sexual activity: Not Currently  Other Topics Concern  . Not on file  Social History Narrative  . Not on file   Social Determinants of Health   Financial Resource Strain:   . Difficulty of Paying Living Expenses:   Food Insecurity:   . Worried About Programme researcher, broadcasting/film/video in the Last Year:   . Barista in the Last Year:   Transportation Needs:   . Freight forwarder (Medical):   Marland Kitchen Lack of Transportation (Non-Medical):   Physical Activity:   . Days of Exercise per Week:   . Minutes of Exercise per Session:   Stress:   . Feeling of Stress :   Social Connections:   . Frequency of Communication with Friends and Family:   . Frequency of Social Gatherings with Friends and Family:   . Attends Religious Services:   . Active Member of Clubs or Organizations:   . Attends Banker Meetings:   Marland Kitchen Marital Status:   Intimate Partner Violence:   . Fear of Current or Ex-Partner:   . Emotionally Abused:   Marland Kitchen Physically Abused:   . Sexually Abused:     Current Outpatient Medications on File Prior to Visit  Medication Sig Dispense Refill  . Accu-Chek Softclix Lancets lancets USE TO CHECK GLUCOSE UP TO ONCE DAILY AS DIRECTED    . blood glucose meter kit and supplies Dispense based on patient and insurance preference. Use up to one time daily as directed. (FOR ICD-10 E10.9, E11.9). 1 each 0  . Blood Glucose Monitoring Suppl (ACCU-CHEK AVIVA PLUS) w/Device KIT USE UP TO ONE TIME DAILY AS DIRECTED    . diclofenac Sodium (VOLTAREN) 1 % GEL Apply 2 g topically 4 (four) times daily. 100 g 1  . diphenhydrAMINE HCl (BENADRYL  ALLERGY PO) Take by mouth. For sleep    . gabapentin (NEURONTIN) 400 MG capsule Take 1 capsule (400 mg total) by mouth at bedtime. 90 capsule 1  . glucose blood (ACCU-CHEK AVIVA PLUS) test strip USE UP TO ONE TIME DAILY AS DIRECTED    . letrozole (FEMARA) 2.5 MG tablet Take 1 tablet by mouth once daily 90 tablet 0  . metFORMIN (GLUCOPHAGE) 500 MG tablet TAKE 2 TABLETS BY MOUTH ONCE DAILY WITH BREAKFAST AND 1 WITH SUPPER 270 tablet 1  . potassium chloride (KLOR-CON) 10 MEQ tablet Take 1 tablet (10 mEq total) by mouth 2 (two) times daily. 60 tablet 1  . triamterene-hydrochlorothiazide (MAXZIDE-25) 37.5-25 MG tablet Take 1 tablet by mouth daily. 90 tablet  1   No current facility-administered medications on file prior to visit.    Allergies  Allergen Reactions  . Other Hives and Itching    [name]      Review of Systems Pertinent items noted in HPI and remainder of comprehensive ROS otherwise negative.    Objective:   BP 123/84   Pulse 92   Ht 5' 6"  (1.676 m)   Wt 249 lb 6.4 oz (113.1 kg)   LMP 01/31/2014 (Approximate) Comment: LMP was 3 years ago.  BMI 40.25 kg/m   General appearance: alert and no distress Neck: no adenopathy, no carotid bruit, no JVD, supple, symmetrical, trachea midline and thyroid not enlarged, symmetric, no tenderness/mass/nodules Lungs: clear to auscultation bilaterally Heart: regular rate and rhythm, S1, S2 normal, no murmur, click, rub or gallop Abdomen: soft, non-tender; bowel sounds normal; no masses,  no organomegaly. Abdominal circumference 49 in. Extremities: extremities normal, atraumatic, no cyanosis or edema Neurologic: Grossly normal   Assessment:   Morbid Obesity. I assessed [place] to be in a contemplative stage with respect to weight loss. Type 2 DM HTN H/o breast cancer   Plan:  Patient desires weight loss, but has not initiated  interventions (dietary modification, or physical activity). General weight loss/lifestyle modification strategies discussed (elicit support from others; identify saboteurs; non-food rewards, etc). Diet interventions: low calorie (1200 kCal/d) deficit diet and qualitative changes (increase low-fat, low-carb, high-protein, high-fiber foods). Patient unable to attend nutrition classes due to work schedule. Given handout on sample diet.  Also encouraged on maintaining adequate water intake (currently drinking 4-6 bottles of water daily). Informal exercise measures discussed, e.g. taking stairs instead of elevator. Regular aerobic exercise program discussed. Advised that I would not be prescribing medication at this time as she has not initiated other interventions (dietary modification and physical activity). Once she has initiated this, if no further weight loss success, can consider medication.  Follow up in: 1 month.    A total of 30 minutes were spent face-to-face with the patient during the encounter with greater than 50% dealing with counseling and coordination of care.   Rubie Maid, MD [facility]

## 2020-06-30 NOTE — Progress Notes (Signed)
Pt is present to discuss weight management.  Pt stated that she was doing well no problems.

## 2020-06-30 NOTE — Patient Instructions (Signed)
Daily Weight Record It is important to weigh yourself daily. To do this:  Make sure you use a reliable scale. Use the same scale each day.  Keep this daily weight chart near your scale.  Weigh yourself each morning at the same time.  Before weighing yourself: ? Take off your shoes. ? Make sure you are wearing the same amount of clothing each day.  Write down your weight in the spaces on the form.  Compare today's weight to yesterday's weight.  Bring this form with you to your follow-up visits with your health care provider. Call your health care provider if you have concerns about your weight, including rapid weight gain or loss. Date: ________ Weight: ____________________ Date: ________ Weight: ____________________ Date: ________ Weight: ____________________ Date: ________ Weight: ____________________ Date: ________ Weight: ____________________ Date: ________ Weight: ____________________ Date: ________ Weight: ____________________ Date: ________ Weight: ____________________ Date: ________ Weight: ____________________ Date: ________ Weight: ____________________ Date: ________ Weight: ____________________ Date: ________ Weight: ____________________ Date: ________ Weight: ____________________ Date: ________ Weight: ____________________ Date: ________ Weight: ____________________ Date: ________ Weight: ____________________ Date: ________ Weight: ____________________ Date: ________ Weight: ____________________ Date: ________ Weight: ____________________ Date: ________ Weight: ____________________ Date: ________ Weight: ____________________ Date: ________ Weight: ____________________ Date: ________ Weight: ____________________ Date: ________ Weight: ____________________ Date: ________ Weight: ____________________ Date: ________ Weight: ____________________ Date: ________ Weight: ____________________ Date: ________ Weight: ____________________ Date: ________ Weight: ____________________  Date: ________ Weight: ____________________ Date: ________ Weight: ____________________ Date: ________ Weight: ____________________ Date: ________ Weight: ____________________ Date: ________ Weight: ____________________ Date: ________ Weight: ____________________ Date: ________ Weight: ____________________ Date: ________ Weight: ____________________ Date: ________ Weight: ____________________ Date: ________ Weight: ____________________ Date: ________ Weight: ____________________ Date: ________ Weight: ____________________ Date: ________ Weight: ____________________ Date: ________ Weight: ____________________ Date: ________ Weight: ____________________ Date: ________ Weight: ____________________ Date: ________ Weight: ____________________ Date: ________ Weight: ____________________ Date: ________ Weight: ____________________ Date: ________ Weight: ____________________ Date: ________ Weight: ____________________ This information is not intended to replace advice given to you by your health care provider. Make sure you discuss any questions you have with your health care provider. Document Revised: 11/05/2017 Document Reviewed: 11/05/2017 Elsevier Patient Education  2020 [place] [employer] Calories are units of energy. Your body needs a certain amount of calories from food to keep you going throughout the day. When you eat more calories than your body needs, your body stores the extra calories as fat. When you eat fewer calories than your body needs, your body burns fat to get the energy it needs. Calorie counting means keeping track of how many calories you eat and drink each day. Calorie counting can be helpful if you need to lose weight. If you make sure to eat fewer calories than your body needs, you should lose weight. Ask your health care provider what a healthy weight is for you. For calorie counting to work, you will need to eat the right number of calories in a day  in order to lose a healthy amount of weight per week. A dietitian can help you determine how many calories you need in a day and will give you suggestions on how to reach your calorie goal.  A healthy amount of weight to lose per week is usually 1-2 lb (0.5-0.9 kg). This usually means that your daily calorie intake should be reduced by 500-750 calories.  Eating 1,200 - 1,500 calories per day can help most women lose weight.  Eating 1,500 - 1,800 calories per day can help most men lose weight. What is my plan? My goal is to  have __________ calories per day. If I have this many calories per day, I should lose around __________ pounds per week. What do I need to know about calorie counting? In order to meet your daily calorie goal, you will need to:  Find out how many calories are in each food you would like to eat. Try to do this before you eat.  Decide how much of the food you plan to eat.  Write down what you ate and how many calories it had. Doing this is called keeping a food log. To successfully lose weight, it is important to balance calorie counting with a healthy lifestyle that includes regular activity. Aim for 150 minutes of moderate exercise (such as walking) or 75 minutes of vigorous exercise (such as running) each week. Where do I find calorie information?  The number of calories in a food can be found on a Nutrition Facts label. If a food does not have a Nutrition Facts label, try to look up the calories online or ask your dietitian for help. Remember that calories are listed per serving. If you choose to have more than one serving of a food, you will have to multiply the calories per serving by the amount of servings you plan to eat. For example, the label on a package of bread might say that a serving size is 1 slice and that there are 90 calories in a serving. If you eat 1 slice, you will have eaten 90 calories. If you eat 2 slices, you will have eaten 180 calories. How do I  keep a food log? Immediately after each meal, record the following information in your food log:  What you ate. Don't forget to include toppings, sauces, and other extras on the food.  How much you ate. This can be measured in cups, ounces, or number of items.  How many calories each food and drink had.  The total number of calories in the meal. Keep your food log near you, such as in a small notebook in your pocket, or use a mobile app or website. Some programs will calculate calories for you and show you how many calories you have left for the day to meet your goal. What are some calorie counting tips?   Use your calories on foods and drinks that will fill you up and not leave you hungry: ? Some examples of foods that fill you up are nuts and nut butters, vegetables, lean proteins, and high-fiber foods like whole grains. High-fiber foods are foods with more than 5 g fiber per serving. ? Drinks such as sodas, specialty coffee drinks, alcohol, and juices have a lot of calories, yet do not fill you up.  Eat nutritious foods and avoid empty calories. Empty calories are calories you get from foods or beverages that do not have many vitamins or protein, such as candy, sweets, and soda. It is better to have a nutritious high-calorie food (such as an avocado) than a food with few nutrients (such as a bag of chips).  Know how many calories are in the foods you eat most often. This will help you calculate calorie counts faster.  Pay attention to calories in drinks. Low-calorie drinks include water and unsweetened drinks.  Pay attention to nutrition labels for "low fat" or "fat free" foods. These foods sometimes have the same amount of calories or more calories than the full fat versions. They also often have added sugar, starch, or salt, to make up for flavor that  was removed with the fat.  Find a way of tracking calories that works for you. Get creative. Try different apps or programs if writing  down calories does not work for you. What are some portion control tips?  Know how many calories are in a serving. This will help you know how many servings of a certain food you can have.  Use a measuring cup to measure serving sizes. You could also try weighing out portions on a kitchen scale. With time, you will be able to estimate serving sizes for some foods.  Take some time to put servings of different foods on your favorite plates, bowls, and cups so you know what a serving looks like.  Try not to eat straight from a bag or box. Doing this can lead to overeating. Put the amount you would like to eat in a cup or on a plate to make sure you are eating the right portion.  Use smaller plates, glasses, and bowls to prevent overeating.  Try not to multitask (for example, watch TV or use your computer) while eating. If it is time to eat, sit down at a table and enjoy your food. This will help you to know when you are full. It will also help you to be aware of what you are eating and how much you are eating. What are tips for following this plan? Reading food labels  Check the calorie count compared to the serving size. The serving size may be smaller than what you are used to eating.  Check the source of the calories. Make sure the food you are eating is high in vitamins and protein and low in saturated and trans fats. Shopping  Read nutrition labels while you shop. This will help you make healthy decisions before you decide to purchase your food.  Make a grocery list and stick to it. Cooking  Try to cook your favorite foods in a healthier way. For example, try baking instead of frying.  Use low-fat dairy products. Meal planning  Use more fruits and vegetables. Half of your plate should be fruits and vegetables.  Include lean proteins like poultry and fish. How do I count calories when eating out?  Ask for smaller portion sizes.  Consider sharing an entree and sides instead of  getting your own entree.  If you get your own entree, eat only half. Ask for a box at the beginning of your meal and put the rest of your entree in it so you are not tempted to eat it.  If calories are listed on the menu, choose the lower calorie options.  Choose dishes that include vegetables, fruits, whole grains, low-fat dairy products, and lean protein.  Choose items that are boiled, broiled, grilled, or steamed. Stay away from items that are buttered, battered, fried, or served with cream sauce. Items labeled "crispy" are usually fried, unless stated otherwise.  Choose water, low-fat milk, unsweetened iced tea, or other drinks without added sugar. If you want an alcoholic beverage, choose a lower calorie option such as a glass of wine or light beer.  Ask for dressings, sauces, and syrups on the side. These are usually high in calories, so you should limit the amount you eat.  If you want a salad, choose a garden salad and ask for grilled meats. Avoid extra toppings like bacon, cheese, or fried items. Ask for the dressing on the side, or ask for olive oil and vinegar or lemon to use as dressing.  Estimate how many servings of a food you are given. For example, a serving of cooked rice is  cup or about the size of half a baseball. Knowing serving sizes will help you be aware of how much food you are eating at restaurants. The list below tells you how big or small some common portion sizes are based on everyday objects: ? 1 oz--4 stacked dice. ? 3 oz--1 deck of cards. ? 1 tsp--1 die. ? 1 Tbsp-- a ping-pong ball. ? 2 Tbsp--1 ping-pong ball. ?  cup-- baseball. ? 1 cup--1 baseball. Summary  Calorie counting means keeping track of how many calories you eat and drink each day. If you eat fewer calories than your body needs, you should lose weight.  A healthy amount of weight to lose per week is usually 1-2 lb (0.5-0.9 kg). This usually means reducing your daily calorie intake by 500-750  calories.  The number of calories in a food can be found on a Nutrition Facts label. If a food does not have a Nutrition Facts label, try to look up the calories online or ask your dietitian for help.  Use your calories on foods and drinks that will fill you up, and not on foods and drinks that will leave you hungry.  Use smaller plates, glasses, and bowls to prevent overeating. This information is not intended to replace advice given to you by your health care provider. Make sure you discuss any questions you have with your health care provider. Document Revised: 07/26/2018 Document Reviewed: 10/06/2016 Elsevier Patient Education  [place].

## 2020-07-01 ENCOUNTER — Encounter: Payer: Self-pay | Admitting: Obstetrics and Gynecology

## 2020-07-01 DIAGNOSIS — Z853 Personal history of malignant neoplasm of breast: Secondary | ICD-10-CM | POA: Insufficient documentation

## 2020-07-08 DIAGNOSIS — I890 Lymphedema, not elsewhere classified: Secondary | ICD-10-CM | POA: Diagnosis not present

## 2020-07-12 ENCOUNTER — Other Ambulatory Visit: Payer: Self-pay | Admitting: Family Medicine

## 2020-07-12 DIAGNOSIS — E876 Hypokalemia: Secondary | ICD-10-CM

## 2020-08-03 ENCOUNTER — Other Ambulatory Visit: Payer: Self-pay

## 2020-08-03 ENCOUNTER — Ambulatory Visit: Payer: Medicaid Other | Attending: Oncology | Admitting: Physical Therapy

## 2020-08-03 ENCOUNTER — Encounter: Payer: Self-pay | Admitting: Obstetrics and Gynecology

## 2020-08-03 ENCOUNTER — Encounter: Payer: Self-pay | Admitting: Physical Therapy

## 2020-08-03 ENCOUNTER — Ambulatory Visit (INDEPENDENT_AMBULATORY_CARE_PROVIDER_SITE_OTHER): Payer: Medicaid Other | Admitting: Obstetrics and Gynecology

## 2020-08-03 VITALS — BP 121/90 | HR 102 | Ht 66.0 in | Wt 251.8 lb

## 2020-08-03 DIAGNOSIS — Z6841 Body Mass Index (BMI) 40.0 and over, adult: Secondary | ICD-10-CM | POA: Diagnosis not present

## 2020-08-03 DIAGNOSIS — R293 Abnormal posture: Secondary | ICD-10-CM | POA: Diagnosis not present

## 2020-08-03 DIAGNOSIS — Z7689 Persons encountering health services in other specified circumstances: Secondary | ICD-10-CM

## 2020-08-03 DIAGNOSIS — E1165 Type 2 diabetes mellitus with hyperglycemia: Secondary | ICD-10-CM | POA: Diagnosis not present

## 2020-08-03 DIAGNOSIS — M25561 Pain in right knee: Secondary | ICD-10-CM | POA: Insufficient documentation

## 2020-08-03 DIAGNOSIS — R2689 Other abnormalities of gait and mobility: Secondary | ICD-10-CM | POA: Insufficient documentation

## 2020-08-03 DIAGNOSIS — M25562 Pain in left knee: Secondary | ICD-10-CM | POA: Insufficient documentation

## 2020-08-03 DIAGNOSIS — G8929 Other chronic pain: Secondary | ICD-10-CM | POA: Diagnosis not present

## 2020-08-03 DIAGNOSIS — I10 Essential (primary) hypertension: Secondary | ICD-10-CM | POA: Diagnosis not present

## 2020-08-03 MED ORDER — PHENTERMINE HCL 37.5 MG PO CAPS: 37.5000 mg | ORAL_CAPSULE | ORAL | 0 refills | Status: DC

## 2020-08-03 NOTE — Progress Notes (Signed)
    GYNECOLOGY PROGRESS NOTE  Subjective:    Patient ID: Chelsea Hudson, female    DOB: 1966/08/29, 54 y.o.   MRN: [id number]  HPI  Patient is a 54 y.o. female who presents for 1 month weight management follow up. She was instructed on dietary modification and increasing physical activity.  She reports that she has had difficulty trying to manage her diet.  Did pretty good for the first week, but afterwards noted that she just could not stick with it.  She does report that although she has not started exercising (due to work schedule, notes it is difficult to find time), however is trying to increase her physical activity while at work.    Current interventions:  1. Diet - Chelsea Hudson reports that she does not eat as much, usually eats breakfast, skips lunch, doesn't eat much for dinner but is not always healthy.  2. Activity - increasing activity at work, notes parking further away and walking to where she needs to go. 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  3. Reports bowel movements are normal.    The following portions of the patient's history were reviewed and updated as appropriate: allergies, current medications, past family history, past medical history, past social history, past surgical history and problem list.  Review of Systems Pertinent items noted in HPI and remainder of comprehensive ROS otherwise negative.   Objective:    Vitals with BMI 08/03/2020 06/30/2020 05/25/2020  Height 5\' 6"  5\' 6"  5\' 6"   Weight 251 lbs 13 oz 249 lbs 6 oz 255 lbs  BMI 40.66 63.14 97.02  Systolic 637 858 850  Diastolic 90 84 69  Pulse 277 92 102    General appearance: alert and no distress Abdomen: soft, non-tender.  Waist circumference 43 in.    Labs:  No new labs  Assessment:   Weight management Obesity, Body mass index is 40.64 kg/m. HTN DM Type II  Plan:   1. Patient desiring weight loss, had initially lost 6 lbs as of last visit, has  regained 3.  Finding difficulty with managing diet.  Given a new handout for meal planning.  Will also refer to [facility] for nutrition consult, especially in light of h/o diabetes. Patient notes that this is a struggle for her, and that she usually needs assistance with making a dietary plan.   2. DM recently resumed Metformin several months ago. Can help with weight loss.  Patient noting compliance.  3. Hypertension controlled on medications.   4. Lengthy discussion had with patient regarding weight loss medications.  She previously had a trial of Phentermine in 2019, and lost ~ 40 lbs.  Strongly desires to resume.  Again had conversation with patient that if she was not actively attempting to lose weight with dietary modifications and exercise, then the results from medication would be short-lived. Patient states that she just needs a jumpstart to help with her weight loss and will continue necessary measures including follow up with Nutritionist once established.  After discussion, conceded to give patient 1 month trial on medication (Phentermine). If no significant weight loss noted, will discontinue and try different medication or other intervention.  5. RTC in 1 month.    A total of 15 minutes were spent face-to-face with the patient during this encounter and over half of that time dealt with counseling and coordination of care.   Rubie Maid, MD [facility]

## 2020-08-03 NOTE — Therapy (Signed)
[facility] [address] [place], [address] Phone: [phone]   Fax:  [phone]  Physical Therapy Evaluation  Patient Details  Name: Chelsea Hudson MRN: [id number] Date of Birth: 08/18/1966 No data recorded  Encounter Date: 08/03/2020   PT End of Session - 08/04/20 0942    Visit Number 1    Number of Visits 17    Date for PT Re-Evaluation 10/01/20    PT Start Time 0545    PT Stop Time 0630    PT Time Calculation (min) 45 min    Activity Tolerance Patient tolerated treatment well    Behavior During Therapy [facility] for tasks assessed/performed           Past Medical History:  Diagnosis Date  . Arthritis    SHOULDER  . Breast cancer ([place]) 02/2017   rt breast  . Breast cancer ([place]) 2018  . Cancer ([place]) 02/28/2017   INVASIVE MAMMARY CARCINOMA WITH MUCINOUS FEATURES.  . Colonic diverticular abscess 06/21/2017   Colonoscopy 06/25/2017: No evidence of malignancy.  . Diabetes mellitus without complication ([place])   . Hypertension   . Irregular heart beat    PT STATES IT "SKIPS A BEAT"   . Obesity   . Personal history of chemotherapy   . Personal history of radiation therapy     Past Surgical History:  Procedure Laterality Date  . ANKLE SURGERY    . BREAST BIOPSY Right 02/28/2017   INVASIVE MAMMARY CARCINOMA WITH MUCINOUS FEATURES.  Marland Kitchen BREAST CYST ASPIRATION Right    NEG  . COLONOSCOPY WITH PROPOFOL N/A 06/25/2017   Procedure: COLONOSCOPY WITH PROPOFOL;  Surgeon: Jonathon Bellows, MD;  Location: [facility] ENDOSCOPY;  Service: Gastroenterology;  Laterality: N/A;  . MASTECTOMY, PARTIAL Right 07/30/2017   Procedure: MASTECTOMY PARTIAL;  Surgeon: Robert Bellow, MD;  Location: [facility] ORS;  Service: General;  Laterality: Right;  . PORTACATH PLACEMENT Left 03/15/2017   Procedure: INSERTION PORT-A-CATH;  Surgeon: Robert Bellow, MD;  Location: [facility] ORS;  Service: General;  Laterality: Left;  . RE-EXCISION OF  BREAST LUMPECTOMY Right 08/17/2017    INVASIVE CARCINOMA EXTENDS TO NEW LATERAL MARGIN. /RE-EXCISION OF BREAST LUMPECTOMY;: Byrnett, Forest Gleason, MD;  [facility] ORS; General;  Laterality: Right;  . SENTINEL NODE BIOPSY Right 07/30/2017   Procedure: SENTINEL NODE BIOPSY;  Surgeon: Robert Bellow, MD;  Location: [facility] ORS;  Service: General;  Laterality: Right;  . SIMPLE MASTECTOMY WITH AXILLARY SENTINEL NODE BIOPSY Right 09/28/2017   Procedure: SIMPLE MASTECTOMY;  Surgeon: Robert Bellow, MD;  Location: [facility] ORS;  Service: General;  Laterality: Right;    There were no vitals filed for this visit.   Patient was educated on diagnosis, anatomy and pathology involved, prognosis, role of PT, and was given an HEP, demonstrating exercise with proper form following verbal and tactile cues, and was given a paper hand out to continue exercise at home. Pt was educated on and agreed to plan of care.  OBJECTIVE  MUSCULOSKELETAL: Tremor: Absent Bulk: Normal Tone: Normal, no spasticity, rigidity, or clonus No trophic changes noted to lower extremities. No ecchymosis, erythema, or edema noted around knee. No gross knee deformity noted  Posture Mild ant pelvic tilt, "hanging on Y's" posture  Lumbar/Hip AROM: WFL and painless with overpressure in all planes  Gait Decreased stride length, ambulates with bilat ankle ER and hip ER  Palpation Mild tenderness to palpation along medial joint line of knee L>R. No pain over patellar tendon.  No pain with palpation to quadriceps or hamstrings.  Strength R/L 3+/3+ Hip flexion 4/4 Hip external rotation 5/5 Hip internal rotation 3+/3+ Hip extension  4-/4 Hip abduction 4+/4+ Hip adduction 4/4- Knee extension 5/5 Knee flexion 5/5 Ankle Dorsiflexion 5/5 Ankle Plantarflexion 5/5 Ankle Inversion 5/5 Ankle Eversion *indicates pain  AROM WNL hip/knee/ankle bilat  Muscle Length Hamstring length: Decreased on LLE Quad length Pat Patrick): Positive bilat Hip  Flexor length Marcello Moores): Positive bilat IT band length Nicoletta Dress): Negative   Passive Accessory Motion Superior Tibiofibular Joint: WNL Knee: WNL bilat Patella: WNL bilat Ankle: WNL bilat  NEUROLOGICAL:  Mental Status Patient is oriented to person, place and time.  Recent memory is intact.  Remote memory is intact.  Attention span and concentration are intact.  Expressive speech is intact.  Patient's fund of knowledge is within normal limits for educational level.  Sensation Grossly intact to light touch bilateral LEs as determined by testing dermatomes L2-S2 Proprioception and hot/cold testing deferred on this date   VASCULAR Dorsalis pedis and posterior tibial pulses are palpable  SPECIAL TESTS  Ligamentous Stability  Anterior Drawer Test: Negative bilat Lachman Test  Negative bilat Posterior Drawer Test:  Negative bilat Valgus Stress Test: Negative bilat Varus Stress Test: Negative bilat  Meniscus Tests McMurray Test:  Negative bilat Thessaly Test:  Negative bilat   FUNCTIONAL TESTS 5xsts 20sec 10MWT 0.42m/s Sit to stand: Unable to deep squat d/t pain in bilat knees, wide BOS increased trunk flex Stairs step to leading with RLE for ascent and RLE for descent Lateral step down knee valgus bilat decreased depth   Ther-Ex PT reviewed the following HEP with patient with patient able to demonstrate a set of the following with min cuing for correction needed. PT educated patient on parameters of therex (how/when to inc/decrease intensity, frequency, rep/set range, stretch hold time, and purpose of therex) with verbalized understanding.  Access Code: [facility] Single Leg Bridge - 1 x daily - 4 x weekly - 3 sets - 10 reps Prone Hip Extension with Bent Knee - 1 x daily - 4 x weekly - 3 sets - 10 reps Mini Squat with Counter Support - 1 x daily - 4 x weekly - 3 sets - 10 reps Modified Thomas Stretch - 2-3 x daily - 7 x weekly - 30-60sec hold          Subjective  Assessment - 08/03/20 1752    Pertinent History Pt is a 54 year old female returning to this clinic for bilat knee pain (familar to this PT for previous shoulder rehab). Chronic bilat knee pain >46months R>L, she had cortisone shots in bilat knees last month, they have felt better following, but continues to have L knee pain "is babying R knee". She reports long standing R knee pain history that has caused her to be cautious with it, believes L knee is hurting more now d/t compensation. 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 Worst pain 8/10 best 0/10. Denies sensationPt denies N/V, B&B changes, unexplained weight fluctuation, saddle paresthesia, fever, night sweats, or unrelenting night pain at this time.    Limitations Walking;Lifting;Standing;House hold activities    How long can you sit comfortably? less than 44mins    How long can you stand comfortably? 59mins    How long can you walk comfortably? 39mins    Patient Stated Goals reciprocal stair pattern  Currently in Pain? Yes    Pain Score 0-No pain    Pain Location Knee    Pain Orientation Right;Left    Pain Descriptors / Indicators Throbbing;Dull    Pain Type Chronic pain    Pain Radiating Towards none    Pain Onset More than a month ago    Pain Frequency Intermittent    Aggravating Factors  stairs, sitting, standing, walking, getting in trunk    Pain Relieving Factors straightening her knee    Effect of Pain on Daily Activities unable to complete work duties or ADLs without pain                          Objective measurements completed on examination: See above findings.               PT Education - 08/04/20 0941    Education Details Patient was educated on diagnosis, anatomy and pathology involved, prognosis, role of PT, and was given an HEP, demonstrating exercise with proper  form following verbal and tactile cues, and was given a paper hand out to continue exercise at home. Pt was educated on and agreed to plan of care.    Person(s) Educated Patient    Methods Explanation;Demonstration;Tactile cues;Verbal cues;Handout    Comprehension Verbalized understanding;Returned demonstration;Verbal cues required;Tactile cues required            PT Short Term Goals - 08/04/20 0942      PT SHORT TERM GOAL #1   Title Pt will be independent with HEP in order to decrease ankle pain and increase strength in order to improve pain-free function at home and work.    Baseline 08/04/20 HEP given    Time 5    Period Weeks    Status New             PT Long Term Goals - 08/04/20 0943      PT LONG TERM GOAL #1   Title Pt will decrease worst pain as reported on NPRS by at least 3 points in order to demonstrate clinically significant reduction in ankle/foot pain.    Baseline 08/04/20 8/10    Time 8    Period Weeks    Status New      PT LONG TERM GOAL #2   Title Pt will decrease 5TSTS by at least 3 seconds in order to demonstrate clinically significant improvement in LE strength    Baseline 08/04/20 20sec    Time 8    Period Weeks    Status New      PT LONG TERM GOAL #3   Title Patient will demonstrate gait speed (10MWT) of 1.87m/s or more in order to demonstrate community ambulator speed    Baseline 08/04/20 0.39m/s    Time 8    Period Weeks    Status New      PT LONG TERM GOAL #4   Title Patient will increase FOTO score to 61 to demonstrate predicted increase in functional mobility to complete ADLs    Baseline 08/04/20 48    Time 8    Period Weeks    Status New                  Plan - 08/04/20 1027    Clinical Impression Statement Pt is a 54 year old female presenting with acute on chronic bilat knee pain, currently L>R following compensating for RLE, successful cortisone shots a month ago for significatnt pain  reduction. Impairments in decreased bilat  hip and core strength, postural dysfunction, gait abnormalities, and pain. Activity limitations in stair negotiation, lifting, squatting, getting into dump turnk, sitting and walking for more than 46mins; inhibiting ability to participate in ADLs, and complete occupational tasks. Pt will benefit from skilled PT to address aforementioned impairments in order to improve function at home and work.    Personal Factors and Comorbidities Age;Comorbidity 1;Comorbidity 2;Fitness;Sex;Profession    Comorbidities HTN, breast cancer    Examination-Activity Limitations Bathing;Lift;Carry;Dressing;Reach Overhead    Examination-Participation Restrictions Laundry;Meal Prep;Yard Work;Community Activity;Driving    Stability/Clinical Decision Making Evolving/Moderate complexity    Clinical Decision Making Moderate    Rehab Potential Good    PT Frequency 2x / week    PT Duration 8 weeks    PT Treatment/Interventions ADLs/Self Care Home Management;Electrical Stimulation;Therapeutic activities;Patient/family education;Taping;Joint Manipulations;Spinal Manipulations;Passive range of motion;Dry needling;Manual techniques;Functional mobility training;Ultrasound;Cryotherapy;Traction;Neuromuscular re-education;Therapeutic exercise;DME Instruction;Moist Heat;Iontophoresis 4mg /ml Dexamethasone    PT Next Visit Plan scar tissue massage    PT Home Exercise Plan [id number]    Consulted and Agree with Plan of Care Patient           Patient will benefit from skilled therapeutic intervention in order to improve the following deficits and impairments:  Increased fascial restricitons, Pain, Improper body mechanics, Decreased mobility, Decreased scar mobility, Impaired tone, Postural dysfunction, Decreased activity tolerance, Decreased endurance, Decreased range of motion, Decreased strength, Impaired UE functional use, Impaired flexibility, Increased edema  Visit Diagnosis: Chronic pain of left knee  Chronic pain of right  knee  Abnormal posture  Other abnormalities of gait and mobility     Problem List Patient Active Problem List   Diagnosis Date Noted  . Morbid obesity with BMI of 40.0-44.9, adult ([place]) 07/01/2020  . History of breast cancer in female 07/01/2020  . Knee pain, bilateral 05/25/2020  . Well woman exam with routine gynecological exam 02/27/2020  . Intermittent tingling sensation of right hand and foot 02/26/2020  . Numbness and tingling in right hand 02/26/2020  . Hypokalemia 02/12/2020  . 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 Chemotherapy-induced neuropathy ([place]) 01/29/2019  . Pyuria 11/07/2018  . Urinary urgency 11/07/2018  . Hyperlipidemia associated with type 2 diabetes mellitus ([place]) 10/02/2018  . Neuropathy due to chemotherapeutic drug ([place]) 10/02/2018  . Myalgia due to statin 10/02/2018  . Controlled type 2 diabetes mellitus with hyperglycemia ([place]) 07/05/2018  . Diverticulosis of large intestine without diverticulitis 03/01/2018  . Personal history of chemotherapy 01/03/2018  . Peripheral neuropathy due to chemotherapy ([place]) 07/04/2017  . Malignant neoplasm of upper-outer quadrant of right breast in female, estrogen receptor positive ([place]) 03/05/2017  . Goals of care, counseling/discussion 03/05/2017  . Essential hypertension 01/05/2016   Chelsea Hudson DPT Chelsea Hudson 08/04/2020, 10:41 AM  [facility] [address], [place], [address] Phone: [phone]   Fax:  [phone]  Name: Chelsea Hudson MRN: [id number] Date of Birth: [birth year]

## 2020-08-03 NOTE — Progress Notes (Signed)
Pt present for weight management. Pt's last visit 06/30/2020 wt 249lb. Pt stated that she was doing well no problems.

## 2020-08-04 ENCOUNTER — Encounter: Payer: Self-pay | Admitting: Obstetrics and Gynecology

## 2020-08-05 ENCOUNTER — Ambulatory Visit: Payer: Medicaid Other | Admitting: Physical Therapy

## 2020-08-11 ENCOUNTER — Other Ambulatory Visit: Payer: Self-pay

## 2020-08-11 ENCOUNTER — Ambulatory Visit: Payer: Medicaid Other | Admitting: Physical Therapy

## 2020-08-11 ENCOUNTER — Encounter: Payer: Self-pay | Admitting: Physical Therapy

## 2020-08-11 DIAGNOSIS — M25562 Pain in left knee: Secondary | ICD-10-CM

## 2020-08-11 DIAGNOSIS — R293 Abnormal posture: Secondary | ICD-10-CM

## 2020-08-11 DIAGNOSIS — R2689 Other abnormalities of gait and mobility: Secondary | ICD-10-CM

## 2020-08-11 DIAGNOSIS — M25561 Pain in right knee: Secondary | ICD-10-CM

## 2020-08-11 NOTE — Therapy (Signed)
[facility] [address], [place], [address] Phone: [phone]   Fax:  [phone]  Physical Therapy Treatment  Patient Details  Name: Chelsea Hudson MRN: [id number] Date of Birth: January 24, 1966 No data recorded  Encounter Date: 08/11/2020   PT End of Session - 08/11/20 1459    Visit Number 2    Number of Visits 17    Date for PT Re-Evaluation 10/01/20    Authorization Time Period 3 visits 08/12/20 - 08/20/20    Authorization - Visit Number 1    Authorization - Number of Visits 3    PT Start Time 0247    PT Stop Time 0315    PT Time Calculation (min) 28 min    Activity Tolerance Patient tolerated treatment well    Behavior During Therapy [facility] for tasks assessed/performed           Past Medical History:  Diagnosis Date  . Arthritis    SHOULDER  . Breast cancer ([place]) 02/2017   rt breast  . Breast cancer ([place]) 2018  . Cancer ([place]) 02/28/2017   INVASIVE MAMMARY CARCINOMA WITH MUCINOUS FEATURES.  . Colonic diverticular abscess 06/21/2017   Colonoscopy 06/25/2017: No evidence of malignancy.  . Diabetes mellitus without complication ([place])   . Hypertension   . Irregular heart beat    PT STATES IT "SKIPS A BEAT"   . Obesity   . Personal history of chemotherapy   . Personal history of radiation therapy     Past Surgical History:  Procedure Laterality Date  . ANKLE SURGERY    . BREAST BIOPSY Right 02/28/2017   INVASIVE MAMMARY CARCINOMA WITH MUCINOUS FEATURES.  Marland Kitchen BREAST CYST ASPIRATION Right    NEG  . COLONOSCOPY WITH PROPOFOL N/A 06/25/2017   Procedure: COLONOSCOPY WITH PROPOFOL;  Surgeon: Jonathon Bellows, MD;  Location: [facility] ENDOSCOPY;  Service: Gastroenterology;  Laterality: N/A;  . MASTECTOMY, PARTIAL Right 07/30/2017   Procedure: MASTECTOMY PARTIAL;  Surgeon: Robert Bellow, MD;  Location: [facility] ORS;  Service: General;  Laterality: Right;  . PORTACATH PLACEMENT Left 03/15/2017   Procedure: INSERTION  PORT-A-CATH;  Surgeon: Robert Bellow, MD;  Location: [facility] ORS;  Service: General;  Laterality: Left;  . RE-EXCISION OF BREAST LUMPECTOMY Right 08/17/2017    INVASIVE CARCINOMA EXTENDS TO NEW LATERAL MARGIN. /RE-EXCISION OF BREAST LUMPECTOMY;: Byrnett, Forest Gleason, MD;  [facility] ORS; General;  Laterality: Right;  . SENTINEL NODE BIOPSY Right 07/30/2017   Procedure: SENTINEL NODE BIOPSY;  Surgeon: Robert Bellow, MD;  Location: [facility] ORS;  Service: General;  Laterality: Right;  . SIMPLE MASTECTOMY WITH AXILLARY SENTINEL NODE BIOPSY Right 09/28/2017   Procedure: SIMPLE MASTECTOMY;  Surgeon: Robert Bellow, MD;  Location: [facility] ORS;  Service: General;  Laterality: Right;    There were no vitals filed for this visit.   Subjective Assessment - 08/11/20 1454    Subjective Pt reports she has been completing HEP. Pain today 4/10 bilat knees, she has been working out more, completing dancersizing, causing more pain. Insurance auth begins tomorrow, appt completed as screen.    Pertinent History Pt is a 54 year old female returning to this clinic for bilat knee pain (familar to this PT for previous shoulder rehab). Chronic bilat knee pain >75months R>L, she had cortisone shots in bilat knees last month, they have felt better following, but continues to have L knee pain "is babying R knee". She reports long standing R knee pain history that has  caused her to be cautious with it, believes L knee is hurting more now d/t compensation. 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 Worst pain 8/10 best 0/10. Denies sensationPt denies N/V, B&B changes, unexplained weight fluctuation, saddle paresthesia, fever, night sweats, or unrelenting night pain at this time.    Limitations Walking;Lifting;Standing;House hold activities           Ther-Ex Nustep seat 11 L5 70mins for gentle strengthening and  endurance of BLEs SL brige 2x 10 bilat with min cuing for full hip ext with good carry over Prone hip ext knee 2x 10 with min cuing for glute contraction with good carry over Modified thomas stretch 60sec bilat with patient reporting this Is "not how she was completing this stretch" but completes with good carry over following cuing Mini squat with counter 2x 10 with min cuing for technique with good carry over                           PT Education - 08/11/20 1458    Education Details HEP review, posture    Person(s) Educated Patient    Methods Demonstration;Explanation;Verbal cues    Comprehension Verbal cues required;Returned demonstration;Verbalized understanding            PT Short Term Goals - 08/04/20 0942      PT SHORT TERM GOAL #1   Title Pt will be independent with HEP in order to decrease ankle pain and increase strength in order to improve pain-free function at home and work.    Baseline 08/04/20 HEP given    Time 5    Period Weeks    Status New             PT Long Term Goals - 08/04/20 0943      PT LONG TERM GOAL #1   Title Pt will decrease worst pain as reported on NPRS by at least 3 points in order to demonstrate clinically significant reduction in ankle/foot pain.    Baseline 08/04/20 8/10    Time 8    Period Weeks    Status New      PT LONG TERM GOAL #2   Title Pt will decrease 5TSTS by at least 3 seconds in order to demonstrate clinically significant improvement in LE strength    Baseline 08/04/20 20sec    Time 8    Period Weeks    Status New      PT LONG TERM GOAL #3   Title Patient will demonstrate gait speed (10MWT) of 1.68m/s or more in order to demonstrate community ambulator speed    Baseline 08/04/20 0.43m/s    Time 8    Period Weeks    Status New      PT LONG TERM GOAL #4   Title Patient will increase FOTO score to 61 to demonstrate predicted increase in functional mobility to complete ADLs    Baseline 08/04/20 48     Time 8    Period Weeks    Status New                 Plan - 08/11/20 1505    Clinical Impression Statement Pt not authorized at time d/t insurance lapse. PT screened patient with HEP review to ensure understanding with good carry over. Patient will benefit from progression toward increased resistance and  functional movements to address strength and motion impairments. Patient demonstrates good carry over of cuing and correction for HEP.    Personal Factors and Comorbidities Age;Comorbidity 1;Comorbidity 2;Fitness;Sex;Profession    Comorbidities HTN, breast cancer    Examination-Activity Limitations Bathing;Lift;Carry;Dressing;Reach Overhead    Examination-Participation Restrictions Laundry;Meal Prep;Yard Work;Community Activity;Driving    Stability/Clinical Decision Making Evolving/Moderate complexity    Clinical Decision Making Moderate    Rehab Potential Good    PT Frequency 2x / week    PT Duration 8 weeks    PT Treatment/Interventions ADLs/Self Care Home Management;Electrical Stimulation;Therapeutic activities;Patient/family education;Taping;Joint Manipulations;Spinal Manipulations;Passive range of motion;Dry needling;Manual techniques;Functional mobility training;Ultrasound;Cryotherapy;Traction;Neuromuscular re-education;Therapeutic exercise;DME Instruction;Moist Heat;Iontophoresis 4mg /ml Dexamethasone    PT Next Visit Plan scar tissue massage    PT Home Exercise Plan [id number]    Consulted and Agree with Plan of Care Patient           Patient will benefit from skilled therapeutic intervention in order to improve the following deficits and impairments:  Increased fascial restricitons, Pain, Improper body mechanics, Decreased mobility, Decreased scar mobility, Impaired tone, Postural dysfunction, Decreased activity tolerance, Decreased endurance, Decreased range of motion, Decreased strength, Impaired UE functional use, Impaired flexibility, Increased edema  Visit  Diagnosis: Chronic pain of left knee  Chronic pain of right knee  Abnormal posture  Other abnormalities of gait and mobility     Problem List Patient Active Problem List   Diagnosis Date Noted  . Morbid obesity with BMI of 40.0-44.9, adult ([place]) 07/01/2020  . History of breast cancer in female 07/01/2020  . Knee pain, bilateral 05/25/2020  . Well woman exam with routine gynecological exam 02/27/2020  . Intermittent tingling sensation of right hand and foot 02/26/2020  . Numbness and tingling in right hand 02/26/2020  . Hypokalemia 02/12/2020  . 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 Chemotherapy-induced neuropathy ([place]) 01/29/2019  . Pyuria 11/07/2018  . Urinary urgency 11/07/2018  . Hyperlipidemia associated with type 2 diabetes mellitus ([place]) 10/02/2018  . Neuropathy due to chemotherapeutic drug ([place]) 10/02/2018  . Myalgia due to statin 10/02/2018  . Controlled type 2 diabetes mellitus with hyperglycemia ([place]) 07/05/2018  . Diverticulosis of large intestine without diverticulitis 03/01/2018  . Personal history of chemotherapy 01/03/2018  . Peripheral neuropathy due to chemotherapy ([place]) 07/04/2017  . Malignant neoplasm of upper-outer quadrant of right breast in female, estrogen receptor positive ([place]) 03/05/2017  . Goals of care, counseling/discussion 03/05/2017  . Essential hypertension 01/05/2016   Durwin Reges DPT Durwin Reges 08/11/2020, 3:14 PM  [facility] [address] [place], [address] Phone: [phone]   Fax:  [phone]  Name: DELBERT VU MRN: [id number] Date of Birth: [birth year]

## 2020-08-12 ENCOUNTER — Ambulatory Visit: Payer: Medicaid Other | Admitting: Physical Therapy

## 2020-08-17 ENCOUNTER — Encounter: Payer: Self-pay | Admitting: Physical Therapy

## 2020-08-17 ENCOUNTER — Other Ambulatory Visit: Payer: Self-pay

## 2020-08-17 ENCOUNTER — Ambulatory Visit: Payer: Medicaid Other

## 2020-08-17 DIAGNOSIS — R293 Abnormal posture: Secondary | ICD-10-CM

## 2020-08-17 DIAGNOSIS — M25561 Pain in right knee: Secondary | ICD-10-CM

## 2020-08-17 DIAGNOSIS — M25562 Pain in left knee: Secondary | ICD-10-CM

## 2020-08-17 DIAGNOSIS — R2689 Other abnormalities of gait and mobility: Secondary | ICD-10-CM | POA: Diagnosis not present

## 2020-08-17 DIAGNOSIS — G8929 Other chronic pain: Secondary | ICD-10-CM | POA: Diagnosis not present

## 2020-08-17 NOTE — Therapy (Signed)
[facility] [address] [place], [address] Phone: [phone]   Fax:  [phone]  Physical Therapy Treatment  Patient Details  Name: TOBEY LIPPARD MRN: [id number] Date of Birth: [birth year] No data recorded  Encounter Date: 08/17/2020   PT End of Session - 08/17/20 1526    Visit Number 3    Number of Visits 17    Date for PT Re-Evaluation 10/01/20    Authorization Time Period 3 visits 08/12/20 - 08/20/20    Authorization - Visit Number 2    Authorization - Number of Visits 3    PT Start Time 1522    PT Stop Time 1600    PT Time Calculation (min) 38 min    Activity Tolerance Patient tolerated treatment well;No increased pain    Behavior During Therapy WFL for tasks assessed/performed           Past Medical History:  Diagnosis Date  . Arthritis    SHOULDER  . Breast cancer ([place]) 02/2017   rt breast  . Breast cancer ([place]) 2018  . Cancer ([place]) 02/28/2017   INVASIVE MAMMARY CARCINOMA WITH MUCINOUS FEATURES.  . Colonic diverticular abscess 06/21/2017   Colonoscopy 06/25/2017: No evidence of malignancy.  . Diabetes mellitus without complication ([place])   . Hypertension   . Irregular heart beat    PT STATES IT "SKIPS A BEAT"   . Obesity   . Personal history of chemotherapy   . Personal history of radiation therapy     Past Surgical History:  Procedure Laterality Date  . ANKLE SURGERY    . BREAST BIOPSY Right 02/28/2017   INVASIVE MAMMARY CARCINOMA WITH MUCINOUS FEATURES.  Marland Kitchen BREAST CYST ASPIRATION Right    NEG  . COLONOSCOPY WITH PROPOFOL N/A 06/25/2017   Procedure: COLONOSCOPY WITH PROPOFOL;  Surgeon: Jonathon Bellows, MD;  Location: [facility] ENDOSCOPY;  Service: Gastroenterology;  Laterality: N/A;  . MASTECTOMY, PARTIAL Right 07/30/2017   Procedure: MASTECTOMY PARTIAL;  Surgeon: Robert Bellow, MD;  Location: [facility] ORS;  Service: General;  Laterality: Right;  . PORTACATH PLACEMENT Left 03/15/2017    Procedure: INSERTION PORT-A-CATH;  Surgeon: Robert Bellow, MD;  Location: [facility] ORS;  Service: General;  Laterality: Left;  . RE-EXCISION OF BREAST LUMPECTOMY Right 08/17/2017    INVASIVE CARCINOMA EXTENDS TO NEW LATERAL MARGIN. /RE-EXCISION OF BREAST LUMPECTOMY;: Byrnett, Forest Gleason, MD;  [facility] ORS; General;  Laterality: Right;  . SENTINEL NODE BIOPSY Right 07/30/2017   Procedure: SENTINEL NODE BIOPSY;  Surgeon: Robert Bellow, MD;  Location: [facility] ORS;  Service: General;  Laterality: Right;  . SIMPLE MASTECTOMY WITH AXILLARY SENTINEL NODE BIOPSY Right 09/28/2017   Procedure: SIMPLE MASTECTOMY;  Surgeon: Robert Bellow, MD;  Location: [facility] ORS;  Service: General;  Laterality: Right;    There were no vitals filed for this visit.   Subjective Assessment - 08/17/20 1525    Subjective Pt doing well today. She reports no pain in knees. Continues to work on her HEP as well as weight loss exercises. No other updates today.    Pertinent History Pt is a 54 year old female returning to this clinic for bilat knee pain (familar to this PT for previous shoulder rehab). Chronic bilat knee pain >79months R>L, she had cortisone shots in bilat knees last month, they have felt better following, but continues to have L knee pain "is babying R knee". She reports long standing R knee pain history that has caused her to  be cautious with it, believes L knee is hurting more now d/t compensation. 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 Worst pain 8/10 best 0/10. Denies sensationPt denies N/V, B&B changes, unexplained weight fluctuation, saddle paresthesia, fever, night sweats, or unrelenting night pain at this time.    Currently in Pain? No/denies           INTERVENTION THIS DATE:   -Nustep seat 11 L5 30mins for gentle strengthening and endurance of BLEs -Seated LAQ 1x15 bilat (no  resistance)  -STS 2x10 from elevated surface (audible crepitus)  -Hooklying Single leg bridge 1x10 bilat -Glute bridge 2x15 (appears to mildly aggravate the low back)  -SL brige 1x10 bilat with min cuing for full hip ext with good carry over (lacks full range) -Modified thomas stretch 2x30sec bilat (contrlateral SKTC prior to execution)  -Prone hip ext 2x10 with min cuing for glute contraction with good carry over -Supine SAQ 2x15 bilat 3lb AW  -Standing hamstring curls 1x10 bilat 3lb weights -SLS, unsupported, 6x10sec bilat       PT Short Term Goals - 08/04/20 0942      PT SHORT TERM GOAL #1   Title Pt will be independent with HEP in order to decrease ankle pain and increase strength in order to improve pain-free function at home and work.    Baseline 08/04/20 HEP given    Time 5    Period Weeks    Status New             PT Long Term Goals - 08/04/20 0943      PT LONG TERM GOAL #1   Title Pt will decrease worst pain as reported on NPRS by at least 3 points in order to demonstrate clinically significant reduction in ankle/foot pain.    Baseline 08/04/20 8/10    Time 8    Period Weeks    Status New      PT LONG TERM GOAL #2   Title Pt will decrease 5TSTS by at least 3 seconds in order to demonstrate clinically significant improvement in LE strength    Baseline 08/04/20 20sec    Time 8    Period Weeks    Status New      PT LONG TERM GOAL #3   Title Patient will demonstrate gait speed (10MWT) of 1.107m/s or more in order to demonstrate community ambulator speed    Baseline 08/04/20 0.42m/s    Time 8    Period Weeks    Status New      PT LONG TERM GOAL #4   Title Patient will increase FOTO score to 61 to demonstrate predicted increase in functional mobility to complete ADLs    Baseline 08/04/20 48    Time 8    Period Weeks    Status New                 Plan - 08/17/20 1527    Clinical Impression Statement Pt able to tolerate entire session as planned, rest  interval provided as needed per pt report. Pt given multimodal cues for most exercises as needed to assure best form, and appropriate ROM. Pt conitnues to make gradual progress toward goals. Pt educated on how crepitus as a sole factor is not a sufficient way to limit activity, but encouraged to consider how her knees feel during activity. Pt continues to make gradual progress  overall.     Personal Factors and Comorbidities Age;Comorbidity 1;Comorbidity 2;Fitness;Sex;Profession    Comorbidities HTN, breast cancer    Examination-Activity Limitations Bathing;Lift;Carry;Dressing;Reach Overhead    Examination-Participation Restrictions Laundry;Meal Prep;Yard Work;Community Activity;Driving    Stability/Clinical Decision Making Evolving/Moderate complexity    Rehab Potential Good    PT Frequency 2x / week    PT Duration 8 weeks    PT Treatment/Interventions ADLs/Self Care Home Management;Electrical Stimulation;Therapeutic activities;Patient/family education;Taping;Joint Manipulations;Spinal Manipulations;Passive range of motion;Dry needling;Manual techniques;Functional mobility training;Ultrasound;Cryotherapy;Traction;Neuromuscular re-education;Therapeutic exercise;DME Instruction;Moist Heat;Iontophoresis 4mg /ml Dexamethasone    PT Next Visit Plan scar tissue massage    PT Home Exercise Plan [id number]    Consulted and Agree with Plan of Care Patient           Patient will benefit from skilled therapeutic intervention in order to improve the following deficits and impairments:  Increased fascial restricitons, Pain, Improper body mechanics, Decreased mobility, Decreased scar mobility, Impaired tone, Postural dysfunction, Decreased activity tolerance, Decreased endurance, Decreased range of motion, Decreased strength, Impaired UE functional use, Impaired flexibility, Increased edema  Visit Diagnosis: Chronic pain of left knee  Chronic pain of right knee  Abnormal posture  Other abnormalities of  gait and mobility     Problem List Patient Active Problem List   Diagnosis Date Noted  . Morbid obesity with BMI of 40.0-44.9, adult ([place]) 07/01/2020  . History of breast cancer in female 07/01/2020  . Knee pain, bilateral 05/25/2020  . Well woman exam with routine gynecological exam 02/27/2020  . Intermittent tingling sensation of right hand and foot 02/26/2020  . Numbness and tingling in right hand 02/26/2020  . Hypokalemia 02/12/2020  . 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 Chemotherapy-induced neuropathy ([place]) 01/29/2019  . Pyuria 11/07/2018  . Urinary urgency 11/07/2018  . Hyperlipidemia associated with type 2 diabetes mellitus ([place]) 10/02/2018  . Neuropathy due to chemotherapeutic drug ([place]) 10/02/2018  . Myalgia due to statin 10/02/2018  . Controlled type 2 diabetes mellitus with hyperglycemia ([place]) 07/05/2018  . Diverticulosis of large intestine without diverticulitis 03/01/2018  . Personal history of chemotherapy 01/03/2018  . Peripheral neuropathy due to chemotherapy ([place]) 07/04/2017  . Malignant neoplasm of upper-outer quadrant of right breast in female, estrogen receptor positive ([place]) 03/05/2017  . Goals of care, counseling/discussion 03/05/2017  . Essential hypertension 01/05/2016   3:54 PM, 08/17/20 Etta Grandchild, PT, DPT Physical Therapist - [place] [phone] (Office)  Tagg Eustice C 08/17/2020, 3:33 PM  [facility] [address] [place], [address] Phone: [phone]   Fax:  [phone]  Name: DAJANEE VOORHEIS MRN: [id number] Date of Birth: 06-06-1966

## 2020-08-18 ENCOUNTER — Ambulatory Visit: Payer: Medicaid Other | Admitting: Physical Therapy

## 2020-08-24 ENCOUNTER — Ambulatory Visit: Payer: Medicaid Other | Attending: Oncology | Admitting: Physical Therapy

## 2020-08-24 ENCOUNTER — Encounter: Payer: Self-pay | Admitting: *Deleted

## 2020-08-24 ENCOUNTER — Encounter: Payer: Medicaid Other | Attending: Obstetrics and Gynecology | Admitting: *Deleted

## 2020-08-24 ENCOUNTER — Encounter: Payer: Self-pay | Admitting: Physical Therapy

## 2020-08-24 ENCOUNTER — Other Ambulatory Visit: Payer: Self-pay

## 2020-08-24 VITALS — BP 136/84 | Ht 67.0 in | Wt 245.4 lb

## 2020-08-24 DIAGNOSIS — M25561 Pain in right knee: Secondary | ICD-10-CM | POA: Diagnosis not present

## 2020-08-24 DIAGNOSIS — E1165 Type 2 diabetes mellitus with hyperglycemia: Secondary | ICD-10-CM | POA: Diagnosis not present

## 2020-08-24 DIAGNOSIS — R293 Abnormal posture: Secondary | ICD-10-CM | POA: Diagnosis not present

## 2020-08-24 DIAGNOSIS — E119 Type 2 diabetes mellitus without complications: Secondary | ICD-10-CM

## 2020-08-24 DIAGNOSIS — G8929 Other chronic pain: Secondary | ICD-10-CM | POA: Insufficient documentation

## 2020-08-24 DIAGNOSIS — M25562 Pain in left knee: Secondary | ICD-10-CM | POA: Insufficient documentation

## 2020-08-24 DIAGNOSIS — R2689 Other abnormalities of gait and mobility: Secondary | ICD-10-CM | POA: Insufficient documentation

## 2020-08-24 NOTE — Therapy (Signed)
[facility] [address], [place], [address] Phone: [phone]   Fax:  [phone]  Physical Therapy Treatment  Patient Details  Name: Chelsea Hudson MRN: [id number] Date of Birth: October 02, 1966 No data recorded  Encounter Date: 08/24/2020   PT End of Session - 08/24/20 1753    Visit Number 4    Number of Visits 17    Date for PT Re-Evaluation 10/01/20    Authorization Time Period 3 visits 08/12/20 - 09/10/20    Authorization - Visit Number 2    Authorization - Number of Visits 3    PT Start Time 0548    PT Stop Time 0626    PT Time Calculation (min) 38 min    Activity Tolerance Patient tolerated treatment well;No increased pain    Behavior During Therapy WFL for tasks assessed/performed           Past Medical History:  Diagnosis Date  . Arthritis    SHOULDER  . Breast cancer ([place]) 02/2017   rt breast  . Breast cancer ([place]) 2018  . Cancer ([place]) 02/28/2017   INVASIVE MAMMARY CARCINOMA WITH MUCINOUS FEATURES.  . Colonic diverticular abscess 06/21/2017   Colonoscopy 06/25/2017: No evidence of malignancy.  . Diabetes mellitus without complication ([place])   . Hypertension   . Irregular heart beat    PT STATES IT "SKIPS A BEAT"   . Obesity   . Personal history of chemotherapy   . Personal history of radiation therapy     Past Surgical History:  Procedure Laterality Date  . ANKLE SURGERY    . BREAST BIOPSY Right 02/28/2017   INVASIVE MAMMARY CARCINOMA WITH MUCINOUS FEATURES.  Marland Kitchen BREAST CYST ASPIRATION Right    NEG  . COLONOSCOPY WITH PROPOFOL N/A 06/25/2017   Procedure: COLONOSCOPY WITH PROPOFOL;  Surgeon: Jonathon Bellows, MD;  Location: [facility] ENDOSCOPY;  Service: Gastroenterology;  Laterality: N/A;  . MASTECTOMY, PARTIAL Right 07/30/2017   Procedure: MASTECTOMY PARTIAL;  Surgeon: Robert Bellow, MD;  Location: [facility] ORS;  Service: General;  Laterality: Right;  . PORTACATH PLACEMENT Left 03/15/2017    Procedure: INSERTION PORT-A-CATH;  Surgeon: Robert Bellow, MD;  Location: [facility] ORS;  Service: General;  Laterality: Left;  . RE-EXCISION OF BREAST LUMPECTOMY Right 08/17/2017    INVASIVE CARCINOMA EXTENDS TO NEW LATERAL MARGIN. /RE-EXCISION OF BREAST LUMPECTOMY;: Byrnett, Forest Gleason, MD;  [facility] ORS; General;  Laterality: Right;  . SENTINEL NODE BIOPSY Right 07/30/2017   Procedure: SENTINEL NODE BIOPSY;  Surgeon: Robert Bellow, MD;  Location: [facility] ORS;  Service: General;  Laterality: Right;  . SIMPLE MASTECTOMY WITH AXILLARY SENTINEL NODE BIOPSY Right 09/28/2017   Procedure: SIMPLE MASTECTOMY;  Surgeon: Robert Bellow, MD;  Location: [facility] ORS;  Service: General;  Laterality: Right;    There were no vitals filed for this visit.   Subjective Assessment - 08/24/20 1751    Subjective Pt is doing well, no pain, but some audible cracking in bilat knees. Continues HEP without question/concerns.    Pertinent History Pt is a 54 year old female returning to this clinic for bilat knee pain (familar to this PT for previous shoulder rehab). Chronic bilat knee pain >73months R>L, she had cortisone shots in bilat knees last month, they have felt better following, but continues to have L knee pain "is babying R knee". She reports long standing R knee pain history that has caused her to be cautious with it, believes L knee is hurting  more now d/t compensation. 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 Worst pain 8/10 best 0/10. Denies sensationPt denies N/V, B&B changes, unexplained weight fluctuation, saddle paresthesia, fever, night sweats, or unrelenting night pain at this time.    Limitations Walking;Lifting;Standing;House hold activities    How long can you sit comfortably? less than 23mins    How long can you stand comfortably? 28mins    How long can you walk comfortably? 74mins     Diagnostic tests None    Patient Stated Goals reciprocal stair pattern    Pain Onset More than a month ago               Ther-Ex  -Nustep seat 11 L5 56mins for gentle strengthening and endurance of BLEs - Mini squat with 61finger support x10 with good carry over from previous sessions - Mini squat with 10# DB swing 2x 10 with min cuing initially with good carry over following - OMEGA leg press 65# 3x 10 with cuing initially for eccentric control with good carry over - Alt lateral step up onto 6in step x12; 8in step x12 able to complete without CL toe push off on 6in step, unable from 8in  - Side stepping in mini squat RTB 2x R 2x L 32ft with cuing for foot clearance, eccentric control and to maintain squat position with good carry over -SLS, unsupported, 3x10sec bilat           PT Education - 08/24/20 1753    Education Details therex form/technique    Person(s) Educated Patient    Methods Explanation;Demonstration;Verbal cues    Comprehension Verbalized understanding;Returned demonstration;Verbal cues required            PT Short Term Goals - 08/04/20 0942      PT SHORT TERM GOAL #1   Title Pt will be independent with HEP in order to decrease ankle pain and increase strength in order to improve pain-free function at home and work.    Baseline 08/04/20 HEP given    Time 5    Period Weeks    Status New             PT Long Term Goals - 08/04/20 0943      PT LONG TERM GOAL #1   Title Pt will decrease worst pain as reported on NPRS by at least 3 points in order to demonstrate clinically significant reduction in ankle/foot pain.    Baseline 08/04/20 8/10    Time 8    Period Weeks    Status New      PT LONG TERM GOAL #2   Title Pt will decrease 5TSTS by at least 3 seconds in order to demonstrate clinically significant improvement in LE strength    Baseline 08/04/20 20sec    Time 8    Period Weeks    Status New      PT LONG TERM GOAL #3   Title Patient will  demonstrate gait speed (10MWT) of 1.36m/s or more in order to demonstrate community ambulator speed    Baseline 08/04/20 0.55m/s    Time 8    Period Weeks    Status New      PT LONG TERM GOAL #4   Title Patient will increase FOTO score to 61 to demonstrate predicted increase in functional mobility to complete ADLs    Baseline 08/04/20 48  Time 8    Period Weeks    Status New                 Plan - 08/24/20 1803    Clinical Impression Statement PT continued therex progression for increased hip strength with success. Patient is able to comply with all cuing for proper technique, and complete all planned therex with good motivation and no increased pain. PT will continue progression as able.    Personal Factors and Comorbidities Age;Comorbidity 1;Comorbidity 2;Fitness;Sex;Profession    Comorbidities HTN, breast cancer    Examination-Activity Limitations Bathing;Lift;Carry;Dressing;Reach Overhead    Examination-Participation Restrictions Laundry;Meal Prep;Yard Work;Community Activity;Driving    Stability/Clinical Decision Making Evolving/Moderate complexity    Clinical Decision Making Moderate    Rehab Potential Good    PT Frequency 2x / week    PT Treatment/Interventions ADLs/Self Care Home Management;Electrical Stimulation;Therapeutic activities;Patient/family education;Taping;Joint Manipulations;Spinal Manipulations;Passive range of motion;Dry needling;Manual techniques;Functional mobility training;Ultrasound;Cryotherapy;Traction;Neuromuscular re-education;Therapeutic exercise;DME Instruction;Moist Heat;Iontophoresis 4mg /ml Dexamethasone    PT Next Visit Plan scar tissue massage    PT Home Exercise Plan [id number]    Consulted and Agree with Plan of Care Patient           Patient will benefit from skilled therapeutic intervention in order to improve the following deficits and impairments:  Increased fascial restricitons, Pain, Improper body mechanics, Decreased mobility,  Decreased scar mobility, Impaired tone, Postural dysfunction, Decreased activity tolerance, Decreased endurance, Decreased range of motion, Decreased strength, Impaired UE functional use, Impaired flexibility, Increased edema  Visit Diagnosis: Chronic pain of left knee  Chronic pain of right knee  Abnormal posture  Other abnormalities of gait and mobility     Problem List Patient Active Problem List   Diagnosis Date Noted  . Morbid obesity with BMI of 40.0-44.9, adult ([place]) 07/01/2020  . History of breast cancer in female 07/01/2020  . Knee pain, bilateral 05/25/2020  . Well woman exam with routine gynecological exam 02/27/2020  . Intermittent tingling sensation of right hand and foot 02/26/2020  . Numbness and tingling in right hand 02/26/2020  . Hypokalemia 02/12/2020  . 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 Chemotherapy-induced neuropathy ([place]) 01/29/2019  . Pyuria 11/07/2018  . Urinary urgency 11/07/2018  . Hyperlipidemia associated with type 2 diabetes mellitus ([place]) 10/02/2018  . Neuropathy due to chemotherapeutic drug ([place]) 10/02/2018  . Myalgia due to statin 10/02/2018  . Controlled type 2 diabetes mellitus with hyperglycemia ([place]) 07/05/2018  . Diverticulosis of large intestine without diverticulitis 03/01/2018  . Personal history of chemotherapy 01/03/2018  . Peripheral neuropathy due to chemotherapy ([place]) 07/04/2017  . Malignant neoplasm of upper-outer quadrant of right breast in female, estrogen receptor positive ([place]) 03/05/2017  . Goals of care, counseling/discussion 03/05/2017  . Essential hypertension 01/05/2016   Durwin Reges DPT Durwin Reges 08/24/2020, 6:26 PM  [facility] [address], [place], [address] Phone: [phone]   Fax:  [phone]  Name: Chelsea Hudson MRN: [id number] Date of Birth: [birth year]

## 2020-08-24 NOTE — Patient Instructions (Signed)
Check blood sugars 2 x day before breakfast and 2 hrs after supper 3 x week Bring blood sugar records to the next class  Exercise: Continue dancing for 30  minutes 3 days a week and gradually increase to 30 minutes 5 x week  Eat 3 meals day, 1-2  snacks a day Space meals 4-6 hours apart Don't skip meals - have at least 1 serving of protein and 1 serving of carbohydrate at lunch  Make an eye doctor appointment  Return for classes on:

## 2020-08-25 NOTE — Progress Notes (Signed)
Diabetes Self-Management Education  Visit Type: First/Initial  Appt. Start Time: 1605 Appt. End Time: 5427  08/25/2020  Ms. Joaquina Nissen, identified by name and date of birth, is a 54 y.o. female with a diagnosis of Diabetes: Type 2.   ASSESSMENT  Blood pressure 136/84, height 5\' 7"  (1.702 m), weight 245 lb 6.4 oz (111.3 kg), last menstrual period 01/31/2014. Body mass index is 38.44 kg/m.   Diabetes Self-Management Education - 08/24/20 1724      Visit Information   Visit Type First/Initial      Initial Visit   Diabetes Type Type 2    Are you currently following a meal plan? Yes    What type of meal plan do you follow? "eat more - only eating 2 meals/day"    Are you taking your medications as prescribed? Yes    Date Diagnosed 7 months ago      Health Coping   How would you rate your overall health? Good      Psychosocial Assessment   Patient Belief/Attitude about Diabetes Motivated to manage diabetes    Self-care barriers None    Self-management support Doctor's office    Patient Concerns Nutrition/Meal planning;Weight Control;Healthy Lifestyle    Special Needs None    Preferred Learning Style Auditory    Learning Readiness Change in progress    How often do you need to have someone help you when you read instructions, pamphlets, or other written materials from your doctor or pharmacy? 1 - Never    What is the last grade level you completed in school? 12th      Pre-Education Assessment   Patient understands the diabetes disease and treatment process. Needs Instruction    Patient understands incorporating nutritional management into lifestyle. Needs Instruction    Patient undertands incorporating physical activity into lifestyle. Needs Review    Patient understands using medications safely. Needs Instruction    Patient understands monitoring blood glucose, interpreting and using results Needs Review    Patient understands prevention, detection, and treatment of acute  complications. Needs Instruction    Patient understands prevention, detection, and treatment of chronic complications. Needs Instruction    Patient understands how to develop strategies to address psychosocial issues. Needs Instruction    Patient understands how to develop strategies to promote health/change behavior. Needs Instruction      Complications   Last HgB A1C per patient/outside source 7.2 %   05/25/2020   How often do you check your blood sugar? 0 times/day (not testing)   She has a meter but doesn't like to check her blood sugar at home.   Have you had a dilated eye exam in the past 12 months? No    Have you had a dental exam in the past 12 months? Yes    Are you checking your feet? Yes    How many days per week are you checking your feet? 7      Dietary Intake   Breakfast left overs; occasional [employer]    Snack (morning) 0-1 snacks/day - peanuts, fruit    Lunch skips or eats fruit (apple, pear, banana, grapes)    Dinner chicken, occasional beef, pork, salmon, potaotes, peas, beans, corn, rice, green beans, broccoli, greens, onions, lettuce, tomatoes, carrots, cuccumbers    Beverage(s) water      Exercise   Exercise Type Light (walking / raking leaves)   Dancing   How many days per week to you exercise? 3    How many minutes per  day do you exercise? 30    Total minutes per week of exercise 90      Patient Education   Previous Diabetes Education No    Disease state  Definition of diabetes, type 1 and 2, and the diagnosis of diabetes;Factors that contribute to the development of diabetes    Nutrition management  Role of diet in the treatment of diabetes and the relationship between the three main macronutrients and blood glucose level;Food label reading, portion sizes and measuring food.;Reviewed blood glucose goals for pre and post meals and how to evaluate the patients' food intake on their blood glucose level.    Physical activity and exercise  Role of exercise on diabetes  management, blood pressure control and cardiac health.    Medications Reviewed patients medication for diabetes, action, purpose, timing of dose and side effects.    Monitoring Purpose and frequency of SMBG.;Taught/discussed recording of test results and interpretation of SMBG.;Identified appropriate SMBG and/or A1C goals.    Chronic complications Relationship between chronic complications and blood glucose control    Psychosocial adjustment Identified and addressed patients feelings and concerns about diabetes      Individualized Goals (developed by patient)   Reducing Risk Other (comment)   lose weight, lead a healthier lifestyle, become more fit     Outcomes   Expected Outcomes Demonstrated interest in learning. Expect positive outcomes    Future DMSE 2 wks           Individualized Plan for Diabetes Self-Management Training:   Learning Objective:  Patient will have a greater understanding of diabetes self-management. Patient education plan is to attend individual and/or group sessions per assessed needs and concerns.   Plan:   Patient Instructions  Check blood sugars 2 x day before breakfast and 2 hrs after supper 3 x week Bring blood sugar records to the next class Exercise: Continue dancing for 30  minutes 3 days a week and gradually increase to 30 minutes 5 x week Eat 3 meals day, 1-2  snacks a day Space meals 4-6 hours apart Don't skip meals - have at least 1 serving of protein and 1 serving of carbohydrate at lunch Make an eye doctor appointment  Expected Outcomes:  Demonstrated interest in learning. Expect positive outcomes  Education material provided:  General Meal Planning Guidelines Simple Meal Plan  If problems or questions, patient to contact team via:  Johny Drilling, RN, [place] [phone]  Future DSME appointment: 2 wks  September 06, 2020 for  Diabetes  Class 1

## 2020-08-26 ENCOUNTER — Encounter: Payer: Self-pay | Admitting: Physical Therapy

## 2020-08-26 ENCOUNTER — Other Ambulatory Visit: Payer: Self-pay

## 2020-08-26 ENCOUNTER — Ambulatory Visit: Payer: Medicaid Other | Admitting: Physical Therapy

## 2020-08-26 DIAGNOSIS — M25561 Pain in right knee: Secondary | ICD-10-CM | POA: Diagnosis not present

## 2020-08-26 DIAGNOSIS — M25562 Pain in left knee: Secondary | ICD-10-CM | POA: Diagnosis not present

## 2020-08-26 DIAGNOSIS — R2689 Other abnormalities of gait and mobility: Secondary | ICD-10-CM

## 2020-08-26 DIAGNOSIS — G8929 Other chronic pain: Secondary | ICD-10-CM | POA: Diagnosis not present

## 2020-08-26 DIAGNOSIS — R293 Abnormal posture: Secondary | ICD-10-CM | POA: Diagnosis not present

## 2020-08-26 NOTE — Therapy (Signed)
[facility] [address] [place], [address] Phone: [phone]   Fax:  [phone]  Physical Therapy Treatment  Patient Details  Name: Chelsea Hudson MRN: [id number] Date of Birth: Oct 05, 1966 No data recorded  Encounter Date: 08/26/2020   PT End of Session - 08/26/20 1750    Visit Number 5    Number of Visits 17    Date for PT Re-Evaluation 10/01/20    Authorization Time Period 3 visits 08/12/20 - 09/10/20    Authorization - Visit Number 3    Authorization - Number of Visits 3    PT Start Time 6962    PT Stop Time 0625    PT Time Calculation (min) 40 min    Activity Tolerance Patient tolerated treatment well;No increased pain    Behavior During Therapy WFL for tasks assessed/performed           Past Medical History:  Diagnosis Date  . Arthritis    SHOULDER  . Breast cancer ([place]) 02/2017   rt breast  . Breast cancer ([place]) 2018  . Cancer ([place]) 02/28/2017   INVASIVE MAMMARY CARCINOMA WITH MUCINOUS FEATURES.  . Colonic diverticular abscess 06/21/2017   Colonoscopy 06/25/2017: No evidence of malignancy.  . Diabetes mellitus without complication ([place])   . Hypertension   . Irregular heart beat    PT STATES IT "SKIPS A BEAT"   . Obesity   . Personal history of chemotherapy   . Personal history of radiation therapy     Past Surgical History:  Procedure Laterality Date  . ANKLE SURGERY    . BREAST BIOPSY Right 02/28/2017   INVASIVE MAMMARY CARCINOMA WITH MUCINOUS FEATURES.  Marland Kitchen BREAST CYST ASPIRATION Right    NEG  . COLONOSCOPY WITH PROPOFOL N/A 06/25/2017   Procedure: COLONOSCOPY WITH PROPOFOL;  Surgeon: Jonathon Bellows, MD;  Location: [facility] ENDOSCOPY;  Service: Gastroenterology;  Laterality: N/A;  . MASTECTOMY, PARTIAL Right 07/30/2017   Procedure: MASTECTOMY PARTIAL;  Surgeon: Robert Bellow, MD;  Location: [facility] ORS;  Service: General;  Laterality: Right;  . PORTACATH PLACEMENT Left 03/15/2017    Procedure: INSERTION PORT-A-CATH;  Surgeon: Robert Bellow, MD;  Location: [facility] ORS;  Service: General;  Laterality: Left;  . RE-EXCISION OF BREAST LUMPECTOMY Right 08/17/2017    INVASIVE CARCINOMA EXTENDS TO NEW LATERAL MARGIN. /RE-EXCISION OF BREAST LUMPECTOMY;: Byrnett, Forest Gleason, MD;  [facility] ORS; General;  Laterality: Right;  . SENTINEL NODE BIOPSY Right 07/30/2017   Procedure: SENTINEL NODE BIOPSY;  Surgeon: Robert Bellow, MD;  Location: [facility] ORS;  Service: General;  Laterality: Right;  . SIMPLE MASTECTOMY WITH AXILLARY SENTINEL NODE BIOPSY Right 09/28/2017   Procedure: SIMPLE MASTECTOMY;  Surgeon: Robert Bellow, MD;  Location: [facility] ORS;  Service: General;  Laterality: Right;    There were no vitals filed for this visit.   Subjective Assessment - 08/26/20 1749    Subjective Pt reports she is doing well overall, increased L knee pain today, reports it is a 4/10. Completing HEP    Pertinent History Pt is a 54 year old female returning to this clinic for bilat knee pain (familar to this PT for previous shoulder rehab). Chronic bilat knee pain >44months R>L, she had cortisone shots in bilat knees last month, they have felt better following, but continues to have L knee pain "is babying R knee". She reports long standing R knee pain history that has caused her to be cautious with it, believes L knee  is hurting more now d/t compensation. 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 Worst pain 8/10 best 0/10. Denies sensationPt denies N/V, B&B changes, unexplained weight fluctuation, saddle paresthesia, fever, night sweats, or unrelenting night pain at this time.    Limitations Walking;Lifting;Standing;House hold activities    How long can you sit comfortably? less than 49mins    How long can you stand comfortably? 74mins    How long can you walk comfortably? 1mins     Diagnostic tests None    Patient Stated Goals reciprocal stair pattern    Pain Onset More than a month ago           Ther-Ex  -Nustep seat 11 L5 61mins for gentle strengthening and endurance of BLEs - Mini squat with 10# DB swing 3x 12with min cuing initially with good carry over following - Side stepping in mini squat RTB 2x R 2x L 63ft with min cuing for eccentric control  - Alt toe taps 4x 15 sec with cuing for "bounce" and speed with some carry over - Alt lateral x12 with good carry over following of demo and multimodal cuing; better carry over with 6in hurdles for visual step over cuing Standing on foam narrow BOS drawing alphabet with UE with cuing for increased rotation and motion with good carry over.  -SLS, unsupported, x15sec bilat; on foam 10sec bilat                          PT Education - 08/26/20 1750    Education Details therex form/ technique    Person(s) Educated Patient    Methods Explanation;Demonstration;Verbal cues    Comprehension Verbalized understanding;Returned demonstration;Verbal cues required            PT Short Term Goals - 08/04/20 0942      PT SHORT TERM GOAL #1   Title Pt will be independent with HEP in order to decrease ankle pain and increase strength in order to improve pain-free function at home and work.    Baseline 08/04/20 HEP given    Time 5    Period Weeks    Status New             PT Long Term Goals - 08/04/20 0943      PT LONG TERM GOAL #1   Title Pt will decrease worst pain as reported on NPRS by at least 3 points in order to demonstrate clinically significant reduction in ankle/foot pain.    Baseline 08/04/20 8/10    Time 8    Period Weeks    Status New      PT LONG TERM GOAL #2   Title Pt will decrease 5TSTS by at least 3 seconds in order to demonstrate clinically significant improvement in LE strength    Baseline 08/04/20 20sec    Time 8    Period Weeks    Status New      PT LONG TERM GOAL #3    Title Patient will demonstrate gait speed (10MWT) of 1.73m/s or more in order to demonstrate community ambulator speed    Baseline 08/04/20 0.15m/s    Time 8    Period Weeks    Status New      PT LONG TERM GOAL #4   Title Patient will increase FOTO score to 61 to demonstrate predicted increase in  functional mobility to complete ADLs    Baseline 08/04/20 48    Time 8    Period Weeks    Status New                 Plan - 08/26/20 1807    Clinical Impression Statement PT continued therex progression for increased hip and core strength with success. PT progressed standing tolerance through therex as well with success. Pt is able to comply with all cuing and demo for proper technique of therex with good motivation throughout session, with no increased pain. PT will continue progression as able.    Personal Factors and Comorbidities Age;Comorbidity 1;Comorbidity 2;Fitness;Sex;Profession    Comorbidities HTN, breast cancer    Examination-Activity Limitations Bathing;Lift;Carry;Dressing;Reach Overhead    Examination-Participation Restrictions Laundry;Meal Prep;Yard Work;Community Activity;Driving    Stability/Clinical Decision Making Evolving/Moderate complexity    Clinical Decision Making Moderate    Rehab Potential Good    PT Frequency 2x / week    PT Duration 8 weeks    PT Treatment/Interventions ADLs/Self Care Home Management;Electrical Stimulation;Therapeutic activities;Patient/family education;Taping;Joint Manipulations;Spinal Manipulations;Passive range of motion;Dry needling;Manual techniques;Functional mobility training;Ultrasound;Cryotherapy;Traction;Neuromuscular re-education;Therapeutic exercise;DME Instruction;Moist Heat;Iontophoresis 4mg /ml Dexamethasone    PT Next Visit Plan scar tissue massage    PT Home Exercise Plan [id number]    Consulted and Agree with Plan of Care Patient           Patient will benefit from skilled therapeutic intervention in order to improve  the following deficits and impairments:  Increased fascial restricitons, Pain, Improper body mechanics, Decreased mobility, Decreased scar mobility, Impaired tone, Postural dysfunction, Decreased activity tolerance, Decreased endurance, Decreased range of motion, Decreased strength, Impaired UE functional use, Impaired flexibility, Increased edema  Visit Diagnosis: Chronic pain of left knee  Chronic pain of right knee  Abnormal posture  Other abnormalities of gait and mobility     Problem List Patient Active Problem List   Diagnosis Date Noted  . Morbid obesity with BMI of 40.0-44.9, adult ([place]) 07/01/2020  . History of breast cancer in female 07/01/2020  . Knee pain, bilateral 05/25/2020  . Well woman exam with routine gynecological exam 02/27/2020  . Intermittent tingling sensation of right hand and foot 02/26/2020  . Numbness and tingling in right hand 02/26/2020  . Hypokalemia 02/12/2020  . 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 Pyuria 11/07/2018  . Urinary urgency 11/07/2018  . Hyperlipidemia associated with type 2 diabetes mellitus ([place]) 10/02/2018  . Neuropathy due to chemotherapeutic drug ([place]) 10/02/2018  . Myalgia due to statin 10/02/2018  . Controlled type 2 diabetes mellitus with hyperglycemia ([place]) 07/05/2018  . Diverticulosis of large intestine without diverticulitis 03/01/2018  . Personal history of chemotherapy 01/03/2018  . Peripheral neuropathy due to chemotherapy ([place]) 07/04/2017  . Malignant neoplasm of upper-outer quadrant of right breast in female, estrogen receptor positive ([place]) 03/05/2017  . Goals of care, counseling/discussion 03/05/2017  . Essential hypertension 01/05/2016   Durwin Reges DPT Durwin Reges 08/26/2020, 6:23 PM  [facility] [address] [place], [address] Phone: [phone]   Fax:  [phone]  Name:  KRYSTA BLOOMFIELD MRN: [id number] Date of Birth: 08/15/1966

## 2020-08-31 ENCOUNTER — Ambulatory Visit: Payer: Medicaid Other | Admitting: Physical Therapy

## 2020-08-31 ENCOUNTER — Other Ambulatory Visit: Payer: Self-pay

## 2020-08-31 ENCOUNTER — Encounter: Payer: Self-pay | Admitting: Physical Therapy

## 2020-08-31 DIAGNOSIS — M25562 Pain in left knee: Secondary | ICD-10-CM | POA: Diagnosis not present

## 2020-08-31 DIAGNOSIS — G8929 Other chronic pain: Secondary | ICD-10-CM

## 2020-08-31 DIAGNOSIS — R293 Abnormal posture: Secondary | ICD-10-CM | POA: Diagnosis not present

## 2020-08-31 DIAGNOSIS — M25561 Pain in right knee: Secondary | ICD-10-CM | POA: Diagnosis not present

## 2020-08-31 DIAGNOSIS — R2689 Other abnormalities of gait and mobility: Secondary | ICD-10-CM | POA: Diagnosis not present

## 2020-08-31 NOTE — Therapy (Signed)
[facility] [address] [place], [address] Phone: [phone]   Fax:  [phone]  Physical Therapy Treatment  Patient Details  Name: Chelsea Hudson MRN: [id number] Date of Birth: 04/09/1966 No data recorded  Encounter Date: 08/31/2020   PT End of Session - 08/31/20 1739    Visit Number 6    Number of Visits 17    Date for PT Re-Evaluation 10/01/20    PT Start Time 0530    PT Stop Time 0608    PT Time Calculation (min) 38 min    Activity Tolerance Patient tolerated treatment well;No increased pain    Behavior During Therapy WFL for tasks assessed/performed           Past Medical History:  Diagnosis Date  . Arthritis    SHOULDER  . Breast cancer ([place]) 02/2017   rt breast  . Breast cancer ([place]) 2018  . Cancer ([place]) 02/28/2017   INVASIVE MAMMARY CARCINOMA WITH MUCINOUS FEATURES.  . Colonic diverticular abscess 06/21/2017   Colonoscopy 06/25/2017: No evidence of malignancy.  . Diabetes mellitus without complication ([place])   . Hypertension   . Irregular heart beat    PT STATES IT "SKIPS A BEAT"   . Obesity   . Personal history of chemotherapy   . Personal history of radiation therapy     Past Surgical History:  Procedure Laterality Date  . ANKLE SURGERY    . BREAST BIOPSY Right 02/28/2017   INVASIVE MAMMARY CARCINOMA WITH MUCINOUS FEATURES.  Marland Kitchen BREAST CYST ASPIRATION Right    NEG  . COLONOSCOPY WITH PROPOFOL N/A 06/25/2017   Procedure: COLONOSCOPY WITH PROPOFOL;  Surgeon: Jonathon Bellows, MD;  Location: [facility] ENDOSCOPY;  Service: Gastroenterology;  Laterality: N/A;  . MASTECTOMY, PARTIAL Right 07/30/2017   Procedure: MASTECTOMY PARTIAL;  Surgeon: Robert Bellow, MD;  Location: [facility] ORS;  Service: General;  Laterality: Right;  . PORTACATH PLACEMENT Left 03/15/2017   Procedure: INSERTION PORT-A-CATH;  Surgeon: Robert Bellow, MD;  Location: [facility] ORS;  Service: General;  Laterality: Left;  .  RE-EXCISION OF BREAST LUMPECTOMY Right 08/17/2017    INVASIVE CARCINOMA EXTENDS TO NEW LATERAL MARGIN. /RE-EXCISION OF BREAST LUMPECTOMY;: Byrnett, Forest Gleason, MD;  [facility] ORS; General;  Laterality: Right;  . SENTINEL NODE BIOPSY Right 07/30/2017   Procedure: SENTINEL NODE BIOPSY;  Surgeon: Robert Bellow, MD;  Location: [facility] ORS;  Service: General;  Laterality: Right;  . SIMPLE MASTECTOMY WITH AXILLARY SENTINEL NODE BIOPSY Right 09/28/2017   Procedure: SIMPLE MASTECTOMY;  Surgeon: Robert Bellow, MD;  Location: [facility] ORS;  Service: General;  Laterality: Right;    There were no vitals filed for this visit.   Subjective Assessment - 08/31/20 1729    Subjective Pt reports 4/10 L knee pain today, no R knee pain. Reports she has been more sore lately d/t being more active,completing HEP.    Pertinent History Pt is a 54 year old female returning to this clinic for bilat knee pain (familar to this PT for previous shoulder rehab). Chronic bilat knee pain >75months R>L, she had cortisone shots in bilat knees last month, they have felt better following, but continues to have L knee pain "is babying R knee". She reports long standing R knee pain history that has caused her to be cautious with it, believes L knee is hurting more now d/t compensation. 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 Worst pain 8/10 best 0/10. Denies sensationPt denies N/V, B&B changes, unexplained weight fluctuation, saddle paresthesia, fever, night sweats, or unrelenting night pain at this time.    Limitations Walking;Lifting;Standing;House hold activities    How long can you sit comfortably? less than 86mins    How long can you stand comfortably? 70mins    Diagnostic tests None    Patient Stated Goals reciprocal stair pattern    Pain Onset More than a month ago           Ther-Ex -Treadmill walking up  10% grade 1.60mph 44mins min cuing for foot clearance and inc hip ext with good carry over - 6# ball slam (squat) 3x 12 with good carry over of demonstration - ball toss/retrive from lateral position from PT x10 bilat; for 20sec bilat; 30sec bilat for increased speed/power demand with good carry over  - Alt toe taps 15sec; 3x 20sec with 30sec between demo and cuing needed for true toe tap without drag from step with good carry over of - Alt lateral  Lunge 3x 12 over 6in hurdles with cuing for "power step back" with good carry over - Alt reverse lunge to runners position on foam2x 12 with good carry over of cuing and demo                           PT Education - 08/31/20 1731    Education Details therex form/technique    Person(s) Educated Patient    Methods Explanation;Demonstration;Verbal cues    Comprehension Verbalized understanding;Returned demonstration;Verbal cues required            PT Short Term Goals - 08/04/20 0942      PT SHORT TERM GOAL #1   Title Pt will be independent with HEP in order to decrease ankle pain and increase strength in order to improve pain-free function at home and work.    Baseline 08/04/20 HEP given    Time 5    Period Weeks    Status New             PT Long Term Goals - 08/04/20 0943      PT LONG TERM GOAL #1   Title Pt will decrease worst pain as reported on NPRS by at least 3 points in order to demonstrate clinically significant reduction in ankle/foot pain.    Baseline 08/04/20 8/10    Time 8    Period Weeks    Status New      PT LONG TERM GOAL #2   Title Pt will decrease 5TSTS by at least 3 seconds in order to demonstrate clinically significant improvement in LE strength    Baseline 08/04/20 20sec    Time 8    Period Weeks    Status New      PT LONG TERM GOAL #3   Title Patient will demonstrate gait speed (10MWT) of 1.82m/s or more in order to demonstrate community ambulator speed    Baseline 08/04/20 0.72m/s     Time 8    Period Weeks    Status New      PT LONG TERM GOAL #4   Title Patient will increase FOTO score to 61 to demonstrate predicted increase in functional mobility to complete ADLs    Baseline 08/04/20 48    Time 8    Period Weeks    Status New  Plan - 08/31/20 1759    Clinical Impression Statement PT continued therex progression for increased hip and core strength with success. PT continued progression for increased power and speed as well with success. Patient is motivated throughout session with no increased pain. PT will continue progression as able.    Personal Factors and Comorbidities Age;Comorbidity 1;Comorbidity 2;Fitness;Sex;Profession    Comorbidities HTN, breast cancer    Examination-Activity Limitations Bathing;Lift;Carry;Dressing;Reach Overhead    Examination-Participation Restrictions Laundry;Meal Prep;Yard Work;Community Activity;Driving    Stability/Clinical Decision Making Evolving/Moderate complexity    Clinical Decision Making Moderate    Rehab Potential Good    PT Frequency 2x / week    PT Duration 8 weeks    PT Treatment/Interventions ADLs/Self Care Home Management;Electrical Stimulation;Therapeutic activities;Patient/family education;Taping;Joint Manipulations;Spinal Manipulations;Passive range of motion;Dry needling;Manual techniques;Functional mobility training;Ultrasound;Cryotherapy;Traction;Neuromuscular re-education;Therapeutic exercise;DME Instruction;Moist Heat;Iontophoresis 4mg /ml Dexamethasone    PT Next Visit Plan continue POC    PT Home Exercise Plan [id number]    Consulted and Agree with Plan of Care Patient           Patient will benefit from skilled therapeutic intervention in order to improve the following deficits and impairments:  Increased fascial restricitons, Pain, Improper body mechanics, Decreased mobility, Decreased scar mobility, Impaired tone, Postural dysfunction, Decreased activity tolerance, Decreased  endurance, Decreased range of motion, Decreased strength, Impaired UE functional use, Impaired flexibility, Increased edema  Visit Diagnosis: Chronic pain of left knee  Chronic pain of right knee  Abnormal posture  Other abnormalities of gait and mobility     Problem List Patient Active Problem List   Diagnosis Date Noted  . Morbid obesity with BMI of 40.0-44.9, adult ([place]) 07/01/2020  . History of breast cancer in female 07/01/2020  . Knee pain, bilateral 05/25/2020  . Well woman exam with routine gynecological exam 02/27/2020  . Intermittent tingling sensation of right hand and foot 02/26/2020  . Numbness and tingling in right hand 02/26/2020  . Hypokalemia 02/12/2020  . 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 Chemotherapy-induced neuropathy ([place]) 01/29/2019  . Pyuria 11/07/2018  . Urinary urgency 11/07/2018  . Hyperlipidemia associated with type 2 diabetes mellitus ([place]) 10/02/2018  . Neuropathy due to chemotherapeutic drug ([place]) 10/02/2018  . Myalgia due to statin 10/02/2018  . Controlled type 2 diabetes mellitus with hyperglycemia ([place]) 07/05/2018  . Diverticulosis of large intestine without diverticulitis 03/01/2018  . Personal history of chemotherapy 01/03/2018  . Peripheral neuropathy due to chemotherapy ([place]) 07/04/2017  . Malignant neoplasm of upper-outer quadrant of right breast in female, estrogen receptor positive ([place]) 03/05/2017  . Goals of care, counseling/discussion 03/05/2017  . Essential hypertension 01/05/2016   Durwin Reges DPT Durwin Reges 08/31/2020, 6:08 PM  [facility] [address], [place], [address] Phone: [phone]   Fax:  [phone]  Name: Chelsea Hudson MRN: [id number] Date of Birth: 1966/03/31

## 2020-09-02 ENCOUNTER — Ambulatory Visit: Payer: Medicaid Other | Admitting: Physical Therapy

## 2020-09-06 ENCOUNTER — Encounter: Payer: Medicaid Other | Admitting: *Deleted

## 2020-09-06 ENCOUNTER — Other Ambulatory Visit: Payer: Self-pay

## 2020-09-06 ENCOUNTER — Encounter: Payer: Self-pay | Admitting: *Deleted

## 2020-09-06 VITALS — Wt 244.3 lb

## 2020-09-06 DIAGNOSIS — E1165 Type 2 diabetes mellitus with hyperglycemia: Secondary | ICD-10-CM | POA: Diagnosis not present

## 2020-09-06 DIAGNOSIS — E119 Type 2 diabetes mellitus without complications: Secondary | ICD-10-CM

## 2020-09-06 NOTE — Progress Notes (Signed)

## 2020-09-07 ENCOUNTER — Encounter: Payer: Self-pay | Admitting: Physical Therapy

## 2020-09-07 ENCOUNTER — Ambulatory Visit: Payer: Medicaid Other | Admitting: Physical Therapy

## 2020-09-07 DIAGNOSIS — M25562 Pain in left knee: Secondary | ICD-10-CM | POA: Diagnosis not present

## 2020-09-07 DIAGNOSIS — G8929 Other chronic pain: Secondary | ICD-10-CM | POA: Diagnosis not present

## 2020-09-07 DIAGNOSIS — R293 Abnormal posture: Secondary | ICD-10-CM

## 2020-09-07 DIAGNOSIS — R2689 Other abnormalities of gait and mobility: Secondary | ICD-10-CM | POA: Diagnosis not present

## 2020-09-07 DIAGNOSIS — M25561 Pain in right knee: Secondary | ICD-10-CM | POA: Diagnosis not present

## 2020-09-07 NOTE — Therapy (Signed)
[facility] [address], [place], [address] Phone: [phone]   Fax:  [phone]  Physical Therapy Treatment  Patient Details  Name: Chelsea Hudson MRN: [id number] Date of Birth: [birth year] No data recorded  Encounter Date: 09/07/2020   PT End of Session - 09/07/20 1707    Visit Number 7    Number of Visits 17    Date for PT Re-Evaluation 10/01/20    Authorization Time Period 3 visits 08/12/20 - 09/10/20    Authorization - Visit Number 3    Authorization - Number of Visits 3    PT Start Time 0500    PT Stop Time 0540    PT Time Calculation (min) 40 min    Activity Tolerance Patient tolerated treatment well;No increased pain    Behavior During Therapy WFL for tasks assessed/performed           Past Medical History:  Diagnosis Date  . Arthritis    SHOULDER  . Breast cancer ([place]) 02/2017   rt breast  . Breast cancer ([place]) 2018  . Cancer ([place]) 02/28/2017   INVASIVE MAMMARY CARCINOMA WITH MUCINOUS FEATURES.  . Colonic diverticular abscess 06/21/2017   Colonoscopy 06/25/2017: No evidence of malignancy.  . Diabetes mellitus without complication ([place])   . Hypertension   . Irregular heart beat    PT STATES IT "SKIPS A BEAT"   . Obesity   . Personal history of chemotherapy   . Personal history of radiation therapy     Past Surgical History:  Procedure Laterality Date  . ANKLE SURGERY    . BREAST BIOPSY Right 02/28/2017   INVASIVE MAMMARY CARCINOMA WITH MUCINOUS FEATURES.  Marland Kitchen BREAST CYST ASPIRATION Right    NEG  . COLONOSCOPY WITH PROPOFOL N/A 06/25/2017   Procedure: COLONOSCOPY WITH PROPOFOL;  Surgeon: Jonathon Bellows, MD;  Location[facility] ENDOSCOPY;  Service: Gastroenterology;  Laterality: N/A;  . MASTECTOMY, PARTIAL Right 07/30/2017   Procedure: MASTECTOMY PARTIAL;  Surgeon: Robert Bellow, MD;  Location: [facility] ORS;  Service: General;  Laterality: Right;  . PORTACATH PLACEMENT Left 03/15/2017    Procedure: INSERTION PORT-A-CATH;  Surgeon: Robert Bellow, MD;  Location: [facility] ORS;  Service: General;  Laterality: Left;  . RE-EXCISION OF BREAST LUMPECTOMY Right 08/17/2017    INVASIVE CARCINOMA EXTENDS TO NEW LATERAL MARGIN. /RE-EXCISION OF BREAST LUMPECTOMY;: Byrnett, Forest Gleason, MD;  [facility] ORS; General;  Laterality: Right;  . SENTINEL NODE BIOPSY Right 07/30/2017   Procedure: SENTINEL NODE BIOPSY;  Surgeon: Robert Bellow, MD;  Location: [facility] ORS;  Service: General;  Laterality: Right;  . SIMPLE MASTECTOMY WITH AXILLARY SENTINEL NODE BIOPSY Right 09/28/2017   Procedure: SIMPLE MASTECTOMY;  Surgeon: Robert Bellow, MD;  Location: [facility] ORS;  Service: General;  Laterality: Right;    There were no vitals filed for this visit.   Subjective Assessment - 09/07/20 1701    Subjective Pt reports her knee is a little more aggravated today d/t having to get in and out of the truck more today. Reports her exercising is going well.    Pertinent History Pt is a 54 year old female returning to this clinic for bilat knee pain (familar to this PT for previous shoulder rehab). Chronic bilat knee pain >67months R>L, she had cortisone shots in bilat knees last month, they have felt better following, but continues to have L knee pain "is babying R knee". She reports long standing R knee pain history that has caused  her to be cautious with it, believes L knee is hurting more now d/t compensation. 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 Worst pain 8/10 best 0/10. Denies sensationPt denies N/V, B&B changes, unexplained weight fluctuation, saddle paresthesia, fever, night sweats, or unrelenting night pain at this time.    Limitations Walking;Lifting;Standing;House hold activities    How long can you sit comfortably? less than 76mins    How long can you stand comfortably? 54mins    How long  can you walk comfortably? 54mins    Diagnostic tests None    Patient Stated Goals reciprocal stair pattern    Pain Onset More than a month ago               Ther-Ex -Treadmill walking up 10% grade 1.64mph 45mins min cuing for L knee ext and increased step length with good carry over - 6# ball slam (squat) 3x 15 with good carry over from previous session - Side stepping with GTB at ankles, and GTB in BUE flex with therapist anchoring laterally for increased core activation 3x 6ft bilat with good carry over of increased foot clearance - Alt toe taps 3x 15sec onto 8in step (previously 6in); good carry over of technique - Alt step up > runner position 8in step 3x 12 with cuing for no push off CL LE with decent carry voer OMEGA leg press SL 35# x10; 45# 2x 8 bilat with cuing for eccentric control with good carry over           PT Education - 09/07/20 1704    Education Details therex form/technique    Person(s) Educated Patient    Methods Explanation;Demonstration;Verbal cues    Comprehension Verbalized understanding;Returned demonstration;Verbal cues required            PT Short Term Goals - 08/04/20 0942      PT SHORT TERM GOAL #1   Title Pt will be independent with HEP in order to decrease ankle pain and increase strength in order to improve pain-free function at home and work.    Baseline 08/04/20 HEP given    Time 5    Period Weeks    Status New             PT Long Term Goals - 08/04/20 0943      PT LONG TERM GOAL #1   Title Pt will decrease worst pain as reported on NPRS by at least 3 points in order to demonstrate clinically significant reduction in ankle/foot pain.    Baseline 08/04/20 8/10    Time 8    Period Weeks    Status New      PT LONG TERM GOAL #2   Title Pt will decrease 5TSTS by at least 3 seconds in order to demonstrate clinically significant improvement in LE strength    Baseline 08/04/20 20sec    Time 8    Period Weeks    Status New       PT LONG TERM GOAL #3   Title Patient will demonstrate gait speed (10MWT) of 1.52m/s or more in order to demonstrate community ambulator speed    Baseline 08/04/20 0.33m/s    Time 8    Period Weeks    Status New      PT LONG TERM GOAL #4   Title Patient will increase FOTO score to 61 to demonstrate predicted increase  in functional mobility to complete ADLs    Baseline 08/04/20 48    Time 8    Period Weeks    Status New                 Plan - 09/07/20 1710    Clinical Impression Statement PT continued therex progression for increased BLE and core strength with success. Patient is able to complete all planned therex progression with carry over of multimodal cuing for proper technique without increased pain. PT will continue progression as able.    Personal Factors and Comorbidities Age;Comorbidity 1;Comorbidity 2;Fitness;Sex;Profession    Comorbidities HTN, breast cancer    Examination-Activity Limitations Bathing;Lift;Carry;Dressing;Reach Overhead    Examination-Participation Restrictions Laundry;Meal Prep;Yard Work;Community Activity;Driving    Stability/Clinical Decision Making Evolving/Moderate complexity    Clinical Decision Making Moderate    Rehab Potential Good    PT Frequency 2x / week    PT Duration 8 weeks    PT Treatment/Interventions ADLs/Self Care Home Management;Electrical Stimulation;Therapeutic activities;Patient/family education;Taping;Joint Manipulations;Spinal Manipulations;Passive range of motion;Dry needling;Manual techniques;Functional mobility training;Ultrasound;Cryotherapy;Traction;Neuromuscular re-education;Therapeutic exercise;DME Instruction;Moist Heat;Iontophoresis 4mg /ml Dexamethasone    PT Next Visit Plan continue POC    PT Home Exercise Plan [id number]    Consulted and Agree with Plan of Care Patient           Patient will benefit from skilled therapeutic intervention in order to improve the following deficits and impairments:  Increased fascial  restricitons, Pain, Improper body mechanics, Decreased mobility, Decreased scar mobility, Impaired tone, Postural dysfunction, Decreased activity tolerance, Decreased endurance, Decreased range of motion, Decreased strength, Impaired UE functional use, Impaired flexibility, Increased edema  Visit Diagnosis: Chronic pain of left knee  Chronic pain of right knee  Abnormal posture  Other abnormalities of gait and mobility     Problem List Patient Active Problem List   Diagnosis Date Noted  . Morbid obesity with BMI of 40.0-44.9, adult ([place]) 07/01/2020  . History of breast cancer in female 07/01/2020  . Knee pain, bilateral 05/25/2020  . Well woman exam with routine gynecological exam 02/27/2020  . Intermittent tingling sensation of right hand and foot 02/26/2020  . Numbness and tingling in right hand 02/26/2020  . Hypokalemia 02/12/2020  . 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 Chemotherapy-induced neuropathy ([place]) 01/29/2019  . Pyuria 11/07/2018  . Urinary urgency 11/07/2018  . Hyperlipidemia associated with type 2 diabetes mellitus ([place]) 10/02/2018  . Neuropathy due to chemotherapeutic drug ([place]) 10/02/2018  . Myalgia due to statin 10/02/2018  . Controlled type 2 diabetes mellitus with hyperglycemia ([place]) 07/05/2018  . Diverticulosis of large intestine without diverticulitis 03/01/2018  . Personal history of chemotherapy 01/03/2018  . Peripheral neuropathy due to chemotherapy ([place]) 07/04/2017  . Malignant neoplasm of upper-outer quadrant of right breast in female, estrogen receptor positive ([place]) 03/05/2017  . Goals of care, counseling/discussion 03/05/2017  . Essential hypertension 01/05/2016   Durwin Reges DPT Durwin Reges 09/07/2020, 5:39 PM  [facility] [address], [place], [address] Phone: [phone]   Fax:  [phone]  Name: Chelsea Hudson MRN: [id number] Date of Birth:  May 05, 1966

## 2020-09-09 ENCOUNTER — Other Ambulatory Visit: Payer: Self-pay

## 2020-09-09 ENCOUNTER — Encounter: Payer: Self-pay | Admitting: Physical Therapy

## 2020-09-09 ENCOUNTER — Ambulatory Visit: Payer: Medicaid Other | Admitting: Physical Therapy

## 2020-09-09 ENCOUNTER — Encounter: Payer: Medicaid Other | Admitting: Obstetrics and Gynecology

## 2020-09-09 DIAGNOSIS — R293 Abnormal posture: Secondary | ICD-10-CM

## 2020-09-09 DIAGNOSIS — R2689 Other abnormalities of gait and mobility: Secondary | ICD-10-CM

## 2020-09-09 DIAGNOSIS — M25562 Pain in left knee: Secondary | ICD-10-CM | POA: Diagnosis not present

## 2020-09-09 DIAGNOSIS — M25561 Pain in right knee: Secondary | ICD-10-CM | POA: Diagnosis not present

## 2020-09-09 DIAGNOSIS — G8929 Other chronic pain: Secondary | ICD-10-CM

## 2020-09-09 NOTE — Therapy (Signed)
[facility] [address] [place], [address] Phone: [phone]   Fax:  [phone]  Physical Therapy Treatment  Patient Details  Name: Chelsea Hudson MRN: [id number] Date of Birth: [birth year] No data recorded  Encounter Date: 09/09/2020   PT End of Session - 09/09/20 1754    Visit Number 8    Number of Visits 17    Date for PT Re-Evaluation 10/01/20    Authorization - Visit Number 3    Authorization - Number of Visits 4    PT Start Time 1638    PT Stop Time 0625    PT Time Calculation (min) 40 min    Activity Tolerance Patient tolerated treatment well;No increased pain    Behavior During Therapy WFL for tasks assessed/performed           Past Medical History:  Diagnosis Date  . Arthritis    SHOULDER  . Breast cancer ([place]) 02/2017   rt breast  . Breast cancer ([place]) 2018  . Cancer ([place]) 02/28/2017   INVASIVE MAMMARY CARCINOMA WITH MUCINOUS FEATURES.  . Colonic diverticular abscess 06/21/2017   Colonoscopy 06/25/2017: No evidence of malignancy.  . Diabetes mellitus without complication ([place])   . Hypertension   . Irregular heart beat    PT STATES IT "SKIPS A BEAT"   . Obesity   . Personal history of chemotherapy   . Personal history of radiation therapy     Past Surgical History:  Procedure Laterality Date  . ANKLE SURGERY    . BREAST BIOPSY Right 02/28/2017   INVASIVE MAMMARY CARCINOMA WITH MUCINOUS FEATURES.  Marland Kitchen BREAST CYST ASPIRATION Right    NEG  . COLONOSCOPY WITH PROPOFOL N/A 06/25/2017   Procedure: COLONOSCOPY WITH PROPOFOL;  Surgeon: Jonathon Bellows, MD;  Location: [facility] ENDOSCOPY;  Service: Gastroenterology;  Laterality: N/A;  . MASTECTOMY, PARTIAL Right 07/30/2017   Procedure: MASTECTOMY PARTIAL;  Surgeon: Robert Bellow, MD;  Location: [facility] ORS;  Service: General;  Laterality: Right;  . PORTACATH PLACEMENT Left 03/15/2017   Procedure: INSERTION PORT-A-CATH;  Surgeon: Robert Bellow, MD;  Location: [facility] ORS;  Service: General;  Laterality: Left;  . RE-EXCISION OF BREAST LUMPECTOMY Right 08/17/2017    INVASIVE CARCINOMA EXTENDS TO NEW LATERAL MARGIN. /RE-EXCISION OF BREAST LUMPECTOMY;: Byrnett, Forest Gleason, MD;  [facility] ORS; General;  Laterality: Right;  . SENTINEL NODE BIOPSY Right 07/30/2017   Procedure: SENTINEL NODE BIOPSY;  Surgeon: Robert Bellow, MD;  Location: [facility] ORS;  Service: General;  Laterality: Right;  . SIMPLE MASTECTOMY WITH AXILLARY SENTINEL NODE BIOPSY Right 09/28/2017   Procedure: SIMPLE MASTECTOMY;  Surgeon: Robert Bellow, MD;  Location: [facility]S;  Service: General;  Laterality: Right;    There were no vitals filed for this visit.   Subjective Assessment - 09/09/20 1750    Subjective Pt reports after last session her knees were very sore that evening, but felt better the next day. Is completing HEP, doing workouts, and feeling stronger.    Pertinent History Pt is a 54 year old female returning to this clinic for bilat knee pain (familar to this PT for previous shoulder rehab). Chronic bilat knee pain >75months R>L, she had cortisone shots in bilat knees last month, they have felt better following, but continues to have L knee pain "is babying R knee". She reports long standing R knee pain history that has caused her to be cautious with it, believes L knee is hurting more now  d/t compensation. 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 Worst pain 8/10 best 0/10. Denies sensationPt denies N/V, B&B changes, unexplained weight fluctuation, saddle paresthesia, fever, night sweats, or unrelenting night pain at this time.    Limitations Walking;Lifting;Standing;House hold activities    How long can you sit comfortably? less than 55mins    How long can you stand comfortably? 85mins    How long can you walk comfortably? 75mins     Diagnostic tests None    Patient Stated Goals reciprocal stair pattern    Pain Onset More than a month ago            Ther-Ex -Treadmill walking up 10% grade 1.30mph 24mins min cuing for L knee ext and increased step length with good carry over - Alt LE swing walk touch to opposite hand 59ft; with 3# AW 3x 30ft - alt step up/down 6in 3x 30sec with cuing for increased speed, and soft landing with good carry over Squat to overhead press 8# DB bilat 3x 8 with cuing for squat mechanics initially with good carry over - Alt step up > runner position 8in step 2x 12 with cuing for no push off CL LE with decent carry voer OMEGA leg press SL 45# 2x 10 bilat with cuing for eccentric control with good carry over                           PT Education - 09/09/20 1754    Education Details therex form/technique    Person(s) Educated Patient    Methods Explanation;Demonstration;Verbal cues    Comprehension Verbalized understanding;Returned demonstration;Verbal cues required            PT Short Term Goals - 08/04/20 0942      PT SHORT TERM GOAL #1   Title Pt will be independent with HEP in order to decrease ankle pain and increase strength in order to improve pain-free function at home and work.    Baseline 08/04/20 HEP given    Time 5    Period Weeks    Status New             PT Long Term Goals - 08/04/20 0943      PT LONG TERM GOAL #1   Title Pt will decrease worst pain as reported on NPRS by at least 3 points in order to demonstrate clinically significant reduction in ankle/foot pain.    Baseline 08/04/20 8/10    Time 8    Period Weeks    Status New      PT LONG TERM GOAL #2   Title Pt will decrease 5TSTS by at least 3 seconds in order to demonstrate clinically significant improvement in LE strength    Baseline 08/04/20 20sec    Time 8    Period Weeks    Status New      PT LONG TERM GOAL #3   Title Patient will demonstrate gait speed (10MWT) of 1.84m/s or  more in order to demonstrate community ambulator speed    Baseline 08/04/20 0.21m/s    Time 8    Period Weeks    Status New      PT LONG TERM GOAL #4   Title Patient will increase FOTO score to 61 to demonstrate predicted increase in functional mobility to complete ADLs    Baseline  08/04/20 48    Time 8    Period Weeks    Status New                 Plan - 09/09/20 1819    Clinical Impression Statement PT continued therex progression for LE strength and stability with success. Patient is able to comply with all cuing for proper technique of therex following multimodal cuing. Patient with good motivation throughout session and no increased pain. PT will continue progression as able.    Personal Factors and Comorbidities Age;Comorbidity 1;Comorbidity 2;Fitness;Sex;Profession    Comorbidities HTN, breast cancer    Examination-Activity Limitations Bathing;Lift;Carry;Dressing;Reach Overhead    Examination-Participation Restrictions Laundry;Meal Prep;Yard Work;Community Activity;Driving    Stability/Clinical Decision Making Evolving/Moderate complexity    Clinical Decision Making Moderate    Rehab Potential Good    PT Frequency 2x / week    PT Duration 8 weeks    PT Treatment/Interventions ADLs/Self Care Home Management;Electrical Stimulation;Therapeutic activities;Patient/family education;Taping;Joint Manipulations;Spinal Manipulations;Passive range of motion;Dry needling;Manual techniques;Functional mobility training;Ultrasound;Cryotherapy;Traction;Neuromuscular re-education;Therapeutic exercise;DME Instruction;Moist Heat;Iontophoresis 4mg /ml Dexamethasone    PT Next Visit Plan continue POC    PT Home Exercise Plan [id number]    Consulted and Agree with Plan of Care Patient           Patient will benefit from skilled therapeutic intervention in order to improve the following deficits and impairments:  Increased fascial restricitons, Pain, Improper body mechanics, Decreased  mobility, Decreased scar mobility, Impaired tone, Postural dysfunction, Decreased activity tolerance, Decreased endurance, Decreased range of motion, Decreased strength, Impaired UE functional use, Impaired flexibility, Increased edema  Visit Diagnosis: Chronic pain of left knee  Chronic pain of right knee  Abnormal posture  Other abnormalities of gait and mobility     Problem List Patient Active Problem List   Diagnosis Date Noted  . Morbid obesity with BMI of 40.0-44.9, adult ([place]) 07/01/2020  . History of breast cancer in female 07/01/2020  . Knee pain, bilateral 05/25/2020  . Well woman exam with routine gynecological exam 02/27/2020  . Intermittent tingling sensation of right hand and foot 02/26/2020  . Numbness and tingling in right hand 02/26/2020  . Hypokalemia 02/12/2020  . 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 Chemotherapy-induced neuropathy ([place]) 01/29/2019  . Pyuria 11/07/2018  . Urinary urgency 11/07/2018  . Hyperlipidemia associated with type 2 diabetes mellitus ([place]) 10/02/2018  . Neuropathy due to chemotherapeutic drug ([place]) 10/02/2018  . Myalgia due to statin 10/02/2018  . Controlled type 2 diabetes mellitus with hyperglycemia ([place]) 07/05/2018  . Diverticulosis of large intestine without diverticulitis 03/01/2018  . Personal history of chemotherapy 01/03/2018  . Peripheral neuropathy due to chemotherapy ([place]) 07/04/2017  . Malignant neoplasm of upper-outer quadrant of right breast in female, estrogen receptor positive ([place]) 03/05/2017  . Goals of care, counseling/discussion 03/05/2017  . Essential hypertension 01/05/2016   Durwin Reges DPT Durwin Reges 09/09/2020, 6:23 PM  [facility] [address], [place], [address] Phone: [phone]   Fax:  [phone]  Name: DAMYIAH MOXLEY MRN: [id number] Date of Birth: 12/27/1965

## 2020-09-13 ENCOUNTER — Encounter: Payer: Medicaid Other | Admitting: *Deleted

## 2020-09-13 ENCOUNTER — Telehealth: Payer: Self-pay

## 2020-09-13 ENCOUNTER — Encounter: Payer: Self-pay | Admitting: *Deleted

## 2020-09-13 ENCOUNTER — Other Ambulatory Visit: Payer: Self-pay

## 2020-09-13 VITALS — Wt 244.5 lb

## 2020-09-13 DIAGNOSIS — E1165 Type 2 diabetes mellitus with hyperglycemia: Secondary | ICD-10-CM | POA: Diagnosis not present

## 2020-09-13 DIAGNOSIS — E119 Type 2 diabetes mellitus without complications: Secondary | ICD-10-CM

## 2020-09-13 NOTE — Telephone Encounter (Signed)
Copied from [place] [id number]. Topic: General - Other >> Sep 13, 2020 10:33 AM Yvette Rack wrote: Reason for CRM: Pt requests to speak with Juliann Pares nurse to discuss Rx for potassium chloride (KLOR-CON) 10 MEQ tablet.   Attempted to contact the patient. She was busy at the time to talk. I will call back at a later time.

## 2020-09-13 NOTE — Progress Notes (Signed)

## 2020-09-14 ENCOUNTER — Ambulatory Visit: Payer: Medicaid Other | Admitting: Physical Therapy

## 2020-09-14 ENCOUNTER — Encounter: Payer: Medicaid Other | Admitting: Obstetrics and Gynecology

## 2020-09-14 ENCOUNTER — Encounter: Payer: Self-pay | Admitting: Physical Therapy

## 2020-09-14 DIAGNOSIS — R2689 Other abnormalities of gait and mobility: Secondary | ICD-10-CM

## 2020-09-14 DIAGNOSIS — M25561 Pain in right knee: Secondary | ICD-10-CM | POA: Diagnosis not present

## 2020-09-14 DIAGNOSIS — R293 Abnormal posture: Secondary | ICD-10-CM | POA: Diagnosis not present

## 2020-09-14 DIAGNOSIS — G8929 Other chronic pain: Secondary | ICD-10-CM

## 2020-09-14 DIAGNOSIS — M25562 Pain in left knee: Secondary | ICD-10-CM

## 2020-09-14 NOTE — Therapy (Signed)
[facility] [address], [place], [address] Phone: [phone]   Fax:  [phone]  Physical Therapy Treatment  Patient Details  Name: Chelsea Hudson MRN: [id number] Date of Birth: [birth year] No data recorded  Encounter Date: 09/14/2020   PT End of Session - 09/14/20 1823    Visit Number 9    Number of Visits 17    Date for PT Re-Evaluation 10/01/20    Authorization - Visit Number 4    Authorization - Number of Visits 4    PT Start Time 0550    PT Stop Time 0630    PT Time Calculation (min) 40 min    Activity Tolerance Patient tolerated treatment well;No increased pain    Behavior During Therapy WFL for tasks assessed/performed           Past Medical History:  Diagnosis Date  . Arthritis    SHOULDER  . Breast cancer ([place]) 02/2017   rt breast  . Breast cancer ([place]) 2018  . Cancer ([place]) 02/28/2017   INVASIVE MAMMARY CARCINOMA WITH MUCINOUS FEATURES.  . Colonic diverticular abscess 06/21/2017   Colonoscopy 06/25/2017: No evidence of malignancy.  . Diabetes mellitus without complication ([place])   . Hypertension   . Irregular heart beat    PT STATES IT "SKIPS A BEAT"   . Obesity   . Personal history of chemotherapy   . Personal history of radiation therapy     Past Surgical History:  Procedure Laterality Date  . ANKLE SURGERY    . BREAST BIOPSY Right 02/28/2017   INVASIVE MAMMARY CARCINOMA WITH MUCINOUS FEATURES.  Marland Kitchen BREAST CYST ASPIRATION Right    NEG  . COLONOSCOPY WITH PROPOFOL N/A 06/25/2017   Procedure: COLONOSCOPY WITH PROPOFOL;  Surgeon: Jonathon Bellows, MD;  Location: [facility] ENDOSCOPY;  Service: Gastroenterology;  Laterality: N/A;  . MASTECTOMY, PARTIAL Right 07/30/2017   Procedure: MASTECTOMY PARTIAL;  Surgeon: Robert Bellow, MD;  Location: [facility] ORS;  Service: General;  Laterality: Right;  . PORTACATH PLACEMENT Left 03/15/2017   Procedure: INSERTION PORT-A-CATH;  Surgeon: Robert Bellow, MD;  Location: [facility] ORS;  Service: General;  Laterality: Left;  . RE-EXCISION OF BREAST LUMPECTOMY Right 08/17/2017    INVASIVE CARCINOMA EXTENDS TO NEW LATERAL MARGIN. /RE-EXCISION OF BREAST LUMPECTOMY;: Byrnett, Forest Gleason, MD;  [facility] ORS; General;  Laterality: Right;  . SENTINEL NODE BIOPSY Right 07/30/2017   Procedure: SENTINEL NODE BIOPSY;  Surgeon: Robert Bellow, MD;  Location: [facility] ORS;  Service: General;  Laterality: Right;  . SIMPLE MASTECTOMY WITH AXILLARY SENTINEL NODE BIOPSY Right 09/28/2017   Procedure: SIMPLE MASTECTOMY;  Surgeon: Robert Bellow, MD;  Location: [facility]S;  Service: General;  Laterality: Right;    There were no vitals filed for this visit.   Subjective Assessment - 09/14/20 1757    Subjective Pt reports 3/10 bilat knee pain today, reporting it is getting better overall. She feels like she is getting stronger. Completing HEP    Pertinent History Pt is a 54 year old female returning to this clinic for bilat knee pain (familar to this PT for previous shoulder rehab). Chronic bilat knee pain >90months R>L, she had cortisone shots in bilat knees last month, they have felt better following, but continues to have L knee pain "is babying R knee". She reports long standing R knee pain history that has caused her to be cautious with it, believes L knee is hurting more now d/t compensation. 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 Worst pain 8/10 best 0/10. Denies sensationPt denies N/V, B&B changes, unexplained weight fluctuation, saddle paresthesia, fever, night sweats, or unrelenting night pain at this time.    Limitations Walking;Lifting;Standing;House hold activities    How long can you sit comfortably? less than 25mins    How long can you stand comfortably? 15mins    How long can you walk comfortably? 50mins    Diagnostic tests None     Patient Stated Goals reciprocal stair pattern    Pain Onset More than a month ago             Ther-Ex -Treadmill walking up 10% grade 1.38mph 79mins min cuing forL knee ext and increased step length with good carry over - SL squat with unilateral TRX 2x 10 bilat good carry over following demo  - alt step up/down 8in 3x 30sec with cuing for increased speed, and soft landing with good carry over Squat with ball slam 3x 12 with min cuing for increased depth with good carry over - Alt step up > runner position 8in step x12; 12in step x12 with cuing for no push off CL LE with decent carry voer Standing on foam catch/toss to alt sides x20               PT Education - 09/14/20 1822    Education Details therex form/technique    Person(s) Educated Patient    Methods Explanation;Demonstration;Verbal cues    Comprehension Verbalized understanding;Returned demonstration;Verbal cues required            PT Short Term Goals - 08/04/20 0942      PT SHORT TERM GOAL #1   Title Pt will be independent with HEP in order to decrease ankle pain and increase strength in order to improve pain-free function at home and work.    Baseline 08/04/20 HEP given    Time 5    Period Weeks    Status New             PT Long Term Goals - 08/04/20 0943      PT LONG TERM GOAL #1   Title Pt will decrease worst pain as reported on NPRS by at least 3 points in order to demonstrate clinically significant reduction in ankle/foot pain.    Baseline 08/04/20 8/10    Time 8    Period Weeks    Status New      PT LONG TERM GOAL #2   Title Pt will decrease 5TSTS by at least 3 seconds in order to demonstrate clinically significant improvement in LE strength    Baseline 08/04/20 20sec    Time 8    Period Weeks    Status New      PT LONG TERM GOAL #3   Title Patient will demonstrate gait speed (10MWT) of 1.47m/s or more in order to demonstrate community ambulator speed    Baseline 08/04/20 0.55m/s     Time 8    Period Weeks    Status New      PT LONG TERM GOAL #4   Title Patient will increase FOTO score to 61 to demonstrate predicted increase in functional mobility to complete ADLs    Baseline 08/04/20 48    Time 8    Period Weeks    Status New  Plan - 09/14/20 1825    Clinical Impression Statement PT continued there progression for LE strength/power with success. Patient is able to comply with all cuing for proper technique of therex without increased pain throughout. Patient is making good progress toward pain management and strength goals. PT will continue progressionas able.    Personal Factors and Comorbidities Age;Comorbidity 1;Comorbidity 2;Fitness;Sex;Profession    Comorbidities HTN, breast cancer    Examination-Activity Limitations Bathing;Lift;Carry;Dressing;Reach Overhead    Examination-Participation Restrictions Laundry;Meal Prep;Yard Work;Community Activity;Driving    Stability/Clinical Decision Making Evolving/Moderate complexity    Clinical Decision Making Moderate    Rehab Potential Good    PT Frequency 2x / week    PT Duration 8 weeks    PT Treatment/Interventions ADLs/Self Care Home Management;Electrical Stimulation;Therapeutic activities;Patient/family education;Taping;Joint Manipulations;Spinal Manipulations;Passive range of motion;Dry needling;Manual techniques;Functional mobility training;Ultrasound;Cryotherapy;Traction;Neuromuscular re-education;Therapeutic exercise;DME Instruction;Moist Heat;Iontophoresis 4mg /ml Dexamethasone    PT Next Visit Plan continue POC    PT Home Exercise Plan [id number]    Consulted and Agree with Plan of Care Patient           Patient will benefit from skilled therapeutic intervention in order to improve the following deficits and impairments:  Increased fascial restricitons, Pain, Improper body mechanics, Decreased mobility, Decreased scar mobility, Impaired tone, Postural dysfunction, Decreased activity  tolerance, Decreased endurance, Decreased range of motion, Decreased strength, Impaired UE functional use, Impaired flexibility, Increased edema  Visit Diagnosis: Chronic pain of left knee  Chronic pain of right knee  Abnormal posture  Other abnormalities of gait and mobility     Problem List Patient Active Problem List   Diagnosis Date Noted  . Morbid obesity with BMI of 40.0-44.9, adult ([place]) 07/01/2020  . History of breast cancer in female 07/01/2020  . Knee pain, bilateral 05/25/2020  . Well woman exam with routine gynecological exam 02/27/2020  . Intermittent tingling sensation of right hand and foot 02/26/2020  . Numbness and tingling in right hand 02/26/2020  . Hypokalemia 02/12/2020  . 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 Chemotherapy-induced neuropathy ([place]) 01/29/2019  . Pyuria 11/07/2018  . Urinary urgency 11/07/2018  . Hyperlipidemia associated with type 2 diabetes mellitus ([place]) 10/02/2018  . Neuropathy due to chemotherapeutic drug ([place]) 10/02/2018  . Myalgia due to statin 10/02/2018  . Controlled type 2 diabetes mellitus with hyperglycemia ([place]) 07/05/2018  . Diverticulosis of large intestine without diverticulitis 03/01/2018  . Personal history of chemotherapy 01/03/2018  . Peripheral neuropathy due to chemotherapy ([place]) 07/04/2017  . Malignant neoplasm of upper-outer quadrant of right breast in female, estrogen receptor positive ([place]) 03/05/2017  . Goals of care, counseling/discussion 03/05/2017  . Essential hypertension 01/05/2016   Durwin Reges DPT Durwin Reges 09/14/2020, 6:34 PM  [facility] [address] [place], [address] Phone: [phone]   Fax:  [phone]  Name: Chelsea Hudson MRN: [id number] Date of Birth: 06-15-1966

## 2020-09-15 ENCOUNTER — Other Ambulatory Visit: Payer: Self-pay

## 2020-09-15 DIAGNOSIS — E876 Hypokalemia: Secondary | ICD-10-CM

## 2020-09-15 MED ORDER — POTASSIUM CHLORIDE ER 10 MEQ PO TBCR: 10.0000 meq | EXTENDED_RELEASE_TABLET | Freq: Two times a day (BID) | ORAL | 0 refills | Status: DC

## 2020-09-16 ENCOUNTER — Ambulatory Visit: Payer: Medicaid Other | Admitting: Physical Therapy

## 2020-09-20 ENCOUNTER — Other Ambulatory Visit: Payer: Self-pay

## 2020-09-20 ENCOUNTER — Encounter: Payer: Self-pay | Admitting: Dietician

## 2020-09-20 ENCOUNTER — Encounter: Payer: Medicaid Other | Attending: Obstetrics and Gynecology | Admitting: Dietician

## 2020-09-20 VITALS — BP 128/80 | Ht 67.0 in | Wt 244.5 lb

## 2020-09-20 DIAGNOSIS — E119 Type 2 diabetes mellitus without complications: Secondary | ICD-10-CM | POA: Insufficient documentation

## 2020-09-20 NOTE — Progress Notes (Signed)
Appt. Start Time: 530p Appt. End Time: 830p  Class 3 Diabetes Overview - identify functions of pancreas and insulin; define insulin deficiency vs insulin resistance  Medications - state name, dose, timing of currently prescribed medications; describe types of medications available for diabetes  Psychosocial - identify DM as a source of stress; state the effects of stress on BG control; verbalize appropriate stress management techniques; identify personal stress issues   Nutritional Management - use food labels to identify serving size, content of carbohydrate, fiber, protein, fat, saturated fat and sodium; recognize food sources of fat, saturated fat, trans fat, and sodium, and verbalize goals for intake; describe healthful, appropriate food choices when dining out   Exercise - state a plan for personal exercise; verbalize contraindications for exercise  Self-Monitoring - state importance of SMBG; use SMBG results to effectively manage diabetes; identify importance of regular HbA1C testing and goals for results  Acute Complications - recognize hyperglycemia and hypoglycemia with causes and effects; identify blood glucose results as high, low or in control; list steps in treating and preventing high and low blood glucose  Chronic Complications - state importance of daily self-foot exams; explain appropriate eye and dental care  Lifestyle Changes/Goals & Health/Community Resources - set goals for proper diabetes care; state need for and frequency of healthcare follow-up; describe appropriate community resources for good health (ADA, web sites, apps)   Teaching Materials Used: Class 3 Slide Packet Diabetes Stress Test Stress Management Tools Stress Poem Goal Setting Worksheet Website/App List

## 2020-09-22 ENCOUNTER — Encounter: Payer: Self-pay | Admitting: *Deleted

## 2020-09-23 ENCOUNTER — Other Ambulatory Visit: Payer: Self-pay

## 2020-09-23 ENCOUNTER — Ambulatory Visit: Payer: Medicaid Other | Attending: Oncology | Admitting: Physical Therapy

## 2020-09-23 ENCOUNTER — Encounter: Payer: Self-pay | Admitting: Physical Therapy

## 2020-09-23 DIAGNOSIS — R293 Abnormal posture: Secondary | ICD-10-CM | POA: Insufficient documentation

## 2020-09-23 DIAGNOSIS — M25561 Pain in right knee: Secondary | ICD-10-CM | POA: Diagnosis not present

## 2020-09-23 DIAGNOSIS — M25562 Pain in left knee: Secondary | ICD-10-CM | POA: Diagnosis not present

## 2020-09-23 DIAGNOSIS — G8929 Other chronic pain: Secondary | ICD-10-CM | POA: Diagnosis not present

## 2020-09-23 DIAGNOSIS — R2689 Other abnormalities of gait and mobility: Secondary | ICD-10-CM | POA: Diagnosis not present

## 2020-09-23 NOTE — Therapy (Signed)
[facility] [address] [place], [address] Phone: [phone]   Fax:  [phone]  Physical Therapy Treatment  Patient Details  Name: Chelsea Hudson MRN: [id number] Date of Birth: [birth year] No data recorded  Encounter Date: 09/23/2020   PT End of Session - 09/23/20 1756    Visit Number 10    Number of Visits 17    Date for PT Re-Evaluation 10/01/20    PT Start Time 5053    PT Stop Time 0625    PT Time Calculation (min) 40 min    Activity Tolerance Patient tolerated treatment well;No increased pain    Behavior During Therapy WFL for tasks assessed/performed           Past Medical History:  Diagnosis Date  . Arthritis    SHOULDER  . Breast cancer ([place]) 02/2017   rt breast  . Breast cancer ([place]) 2018  . Cancer ([place]) 02/28/2017   INVASIVE MAMMARY CARCINOMA WITH MUCINOUS FEATURES.  . Colonic diverticular abscess 06/21/2017   Colonoscopy 06/25/2017: No evidence of malignancy.  . Diabetes mellitus without complication ([place])   . Hypertension   . Irregular heart beat    PT STATES IT "SKIPS A BEAT"   . Obesity   . Personal history of chemotherapy   . Personal history of radiation therapy     Past Surgical History:  Procedure Laterality Date  . ANKLE SURGERY    . BREAST BIOPSY Right 02/28/2017   INVASIVE MAMMARY CARCINOMA WITH MUCINOUS FEATURES.  Marland Kitchen BREAST CYST ASPIRATION Right    NEG  . COLONOSCOPY WITH PROPOFOL N/A 06/25/2017   Procedure: COLONOSCOPY WITH PROPOFOL;  Surgeon: Jonathon Bellows, MD;  Location[facility];  Service: Gastroenterology;  Laterality: N/A;  . MASTECTOMY, PARTIAL Right 07/30/2017   Procedure: MASTECTOMY PARTIAL;  Surgeon: Robert Bellow, MD;  Location: [facility] ORS;  Service: General;  Laterality: Right;  . PORTACATH PLACEMENT Left 03/15/2017   Procedure: INSERTION PORT-A-CATH;  Surgeon: Robert Bellow, MD;  Location: [facility] ORS;  Service: General;  Laterality: Left;  .  RE-EXCISION OF BREAST LUMPECTOMY Right 08/17/2017    INVASIVE CARCINOMA EXTENDS TO NEW LATERAL MARGIN. /RE-EXCISION OF BREAST LUMPECTOMY;: Byrnett, Forest Gleason, MD;  [facility] ORS; General;  Laterality: Right;  . SENTINEL NODE BIOPSY Right 07/30/2017   Procedure: SENTINEL NODE BIOPSY;  Surgeon: Robert Bellow, MD;  Location: [facility] ORS;  Service: General;  Laterality: Right;  . SIMPLE MASTECTOMY WITH AXILLARY SENTINEL NODE BIOPSY Right 09/28/2017   Procedure: SIMPLE MASTECTOMY;  Surgeon: Robert Bellow, MD;  Location: [facility] ORS;  Service: General;  Laterality: Right;    There were no vitals filed for this visit.   Subjective Assessment - 09/23/20 1751    Subjective Reports no pain over the past week. Reports compliance with HEP and dancercise.    Pertinent History Pt is a 54 year old female returning to this clinic for bilat knee pain (familar to this PT for previous shoulder rehab). Chronic bilat knee pain >41months R>L, she had cortisone shots in bilat knees last month, they have felt better following, but continues to have L knee pain "is babying R knee". She reports long standing R knee pain history that has caused her to be cautious with it, believes L knee is hurting more now d/t compensation. 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  Worst pain 8/10 best 0/10. Denies sensationPt denies N/V, B&B changes, unexplained weight fluctuation, saddle paresthesia, fever, night sweats, or unrelenting night pain at this time.    Limitations Walking;Lifting;Standing;House hold activities    How long can you sit comfortably? less than 48mins    How long can you stand comfortably? 23mins    How long can you walk comfortably? 83mins    Diagnostic tests None    Patient Stated Goals reciprocal stair pattern    Pain Onset More than a month ago             Ther-Ex -Treadmill walking up  10% grade 1.71mph 21mins min cuing forL knee ext and increased step length with good carry over - SL squat with unilateral TRX 2x 10 bilat good carry over following demo  - squat jump on total L18 x6; L20 2x 6 with min cuing for landing with good carry over - Abd pushing treadmill belt in mini squat 2x 12 bilat with min cuing to "stay low" with good carry over - alt step up/down 8in 3x 30sec with cuing for increased speed, and soft landing with good carry over - alt speed alt step overs 8in step 3x 30sec         PT Education - 09/23/20 1756    Education Details therex form/technique    Person(s) Educated Patient    Methods Explanation;Demonstration;Verbal cues    Comprehension Verbalized understanding;Returned demonstration;Verbal cues required            PT Short Term Goals - 08/04/20 0942      PT SHORT TERM GOAL #1   Title Pt will be independent with HEP in order to decrease ankle pain and increase strength in order to improve pain-free function at home and work.    Baseline 08/04/20 HEP given    Time 5    Period Weeks    Status New             PT Long Term Goals - 08/04/20 0943      PT LONG TERM GOAL #1   Title Pt will decrease worst pain as reported on NPRS by at least 3 points in order to demonstrate clinically significant reduction in ankle/foot pain.    Baseline 08/04/20 8/10    Time 8    Period Weeks    Status New      PT LONG TERM GOAL #2   Title Pt will decrease 5TSTS by at least 3 seconds in order to demonstrate clinically significant improvement in LE strength    Baseline 08/04/20 20sec    Time 8    Period Weeks    Status New      PT LONG TERM GOAL #3   Title Patient will demonstrate gait speed (10MWT) of 1.56m/s or more in order to demonstrate community ambulator speed    Baseline 08/04/20 0.62m/s    Time 8    Period Weeks    Status New      PT LONG TERM GOAL #4   Title Patient will increase FOTO score to 61 to demonstrate predicted increase in  functional mobility to complete ADLs    Baseline 08/04/20 48    Time 8    Period Weeks    Status New                 Plan - 09/23/20 1813    Clinical Impression Statement PT continued therex progression for increased BLE strength and power with success. Patietn reports  no increased pain, only muscle fatigue throughout session. Patient is motivated throughout session with good carry over of all cuing for proper technique of therex. PT will continue progression as able.    Personal Factors and Comorbidities Age;Comorbidity 1;Comorbidity 2;Fitness;Sex;Profession    Comorbidities HTN, breast cancer    Examination-Activity Limitations Bathing;Lift;Carry;Dressing;Reach Overhead    Examination-Participation Restrictions Laundry;Meal Prep;Yard Work;Community Activity;Driving    Stability/Clinical Decision Making Evolving/Moderate complexity    Clinical Decision Making Moderate    Rehab Potential Good    PT Frequency 2x / week    PT Duration 8 weeks    PT Treatment/Interventions ADLs/Self Care Home Management;Electrical Stimulation;Therapeutic activities;Patient/family education;Taping;Joint Manipulations;Spinal Manipulations;Passive range of motion;Dry needling;Manual techniques;Functional mobility training;Ultrasound;Cryotherapy;Traction;Neuromuscular re-education;Therapeutic exercise;DME Instruction;Moist Heat;Iontophoresis 4mg /ml Dexamethasone    PT Next Visit Plan continue POC    PT Home Exercise Plan [id number]    Consulted and Agree with Plan of Care Patient           Patient will benefit from skilled therapeutic intervention in order to improve the following deficits and impairments:  Increased fascial restricitons, Pain, Improper body mechanics, Decreased mobility, Decreased scar mobility, Impaired tone, Postural dysfunction, Decreased activity tolerance, Decreased endurance, Decreased range of motion, Decreased strength, Impaired UE functional use, Impaired flexibility, Increased  edema  Visit Diagnosis: Chronic pain of left knee  Chronic pain of right knee  Abnormal posture  Other abnormalities of gait and mobility     Problem List Patient Active Problem List   Diagnosis Date Noted  . Morbid obesity with BMI of 40.0-44.9, adult ([place]) 07/01/2020  . History of breast cancer in female 07/01/2020  . Knee pain, bilateral 05/25/2020  . Well woman exam with routine gynecological exam 02/27/2020  . Intermittent tingling sensation of right hand and foot 02/26/2020  . Numbness and tingling in right hand 02/26/2020  . Hypokalemia 02/12/2020  . 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 Chemotherapy-induced neuropathy ([place]) 01/29/2019  . Pyuria 11/07/2018  . Urinary urgency 11/07/2018  . Hyperlipidemia associated with type 2 diabetes mellitus ([place]) 10/02/2018  . Neuropathy due to chemotherapeutic drug ([place]) 10/02/2018  . Myalgia due to statin 10/02/2018  . Controlled type 2 diabetes mellitus with hyperglycemia ([place]) 07/05/2018  . Diverticulosis of large intestine without diverticulitis 03/01/2018  . Personal history of chemotherapy 01/03/2018  . Peripheral neuropathy due to chemotherapy ([place]) 07/04/2017  . Malignant neoplasm of upper-outer quadrant of right breast in female, estrogen receptor positive ([place]) 03/05/2017  . Goals of care, counseling/discussion 03/05/2017  . Essential hypertension 01/05/2016    Durwin Reges 09/23/2020, 6:24 PM  [facility] [address] [place], [address] Phone: [phone]   Fax:  [phone]  Name: KEVINA PILOTO MRN: [id number] Date of Birth: 05-13-1966

## 2020-09-28 ENCOUNTER — Other Ambulatory Visit: Payer: Self-pay

## 2020-09-28 ENCOUNTER — Encounter: Payer: Self-pay | Admitting: Physical Therapy

## 2020-09-28 ENCOUNTER — Ambulatory Visit: Payer: Medicaid Other | Admitting: Physical Therapy

## 2020-09-28 DIAGNOSIS — R293 Abnormal posture: Secondary | ICD-10-CM | POA: Diagnosis not present

## 2020-09-28 DIAGNOSIS — R2689 Other abnormalities of gait and mobility: Secondary | ICD-10-CM

## 2020-09-28 DIAGNOSIS — M25561 Pain in right knee: Secondary | ICD-10-CM

## 2020-09-28 DIAGNOSIS — M25562 Pain in left knee: Secondary | ICD-10-CM | POA: Diagnosis not present

## 2020-09-28 DIAGNOSIS — G8929 Other chronic pain: Secondary | ICD-10-CM | POA: Diagnosis not present

## 2020-09-28 NOTE — Therapy (Signed)
[facility] [address] [place], [address] Phone: [phone]   Fax:  [phone]  Physical Therapy Treatment  Patient Details  Name: Chelsea Hudson MRN: [id number] Date of Birth: 06/10/1966 No data recorded  Encounter Date: 09/28/2020   PT End of Session - 09/28/20 1725    Visit Number 11    Number of Visits 17    Date for PT Re-Evaluation 10/01/20    Authorization Time Period 3 visits 08/12/20 - 09/10/20    PT Start Time 0443    PT Stop Time 0526    PT Time Calculation (min) 43 min    Activity Tolerance Patient tolerated treatment well;No increased pain    Behavior During Therapy WFL for tasks assessed/performed           Past Medical History:  Diagnosis Date  . Arthritis    SHOULDER  . Breast cancer ([place]) 02/2017   rt breast  . Breast cancer ([place]) 2018  . Cancer ([place]) 02/28/2017   INVASIVE MAMMARY CARCINOMA WITH MUCINOUS FEATURES.  . Colonic diverticular abscess 06/21/2017   Colonoscopy 06/25/2017: No evidence of malignancy.  . Diabetes mellitus without complication ([place])   . Hypertension   . Irregular heart beat    PT STATES IT "SKIPS A BEAT"   . Obesity   . Personal history of chemotherapy   . Personal history of radiation therapy     Past Surgical History:  Procedure Laterality Date  . ANKLE SURGERY    . BREAST BIOPSY Right 02/28/2017   INVASIVE MAMMARY CARCINOMA WITH MUCINOUS FEATURES.  Marland Kitchen BREAST CYST ASPIRATION Right    NEG  . COLONOSCOPY WITH PROPOFOL N/A 06/25/2017   Procedure: COLONOSCOPY WITH PROPOFOL;  Surgeon: Jonathon Bellows, MD;  Location: [facility] ENDOSCOPY;  Service: Gastroenterology;  Laterality: N/A;  . MASTECTOMY, PARTIAL Right 07/30/2017   Procedure: MASTECTOMY PARTIAL;  Surgeon: Robert Bellow, MD;  Location: [facility] ORS;  Service: General;  Laterality: Right;  . PORTACATH PLACEMENT Left 03/15/2017   Procedure: INSERTION PORT-A-CATH;  Surgeon: Robert Bellow, MD;   Location: [facility] ORS;  Service: General;  Laterality: Left;  . RE-EXCISION OF BREAST LUMPECTOMY Right 08/17/2017    INVASIVE CARCINOMA EXTENDS TO NEW LATERAL MARGIN. /RE-EXCISION OF BREAST LUMPECTOMY;: Byrnett, Forest Gleason, MD;  [facility] ORS; General;  Laterality: Right;  . SENTINEL NODE BIOPSY Right 07/30/2017   Procedure: SENTINEL NODE BIOPSY;  Surgeon: Robert Bellow, MD;  Location: [facility] ORS;  Service: General;  Laterality: Right;  . SIMPLE MASTECTOMY WITH AXILLARY SENTINEL NODE BIOPSY Right 09/28/2017   Procedure: SIMPLE MASTECTOMY;  Surgeon: Robert Bellow, MD;  Location: [facility] ORS;  Service: General;  Laterality: Right;    There were no vitals filed for this visit.   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    Pertinent History Pt is a 54 year old female returning to this clinic for bilat knee pain (familar to this PT for previous shoulder rehab). Chronic bilat knee pain >64months R>L, she had cortisone shots in bilat knees last month, they have felt better following, but continues to have L knee pain "is babying R knee". She reports long standing R knee pain history that has caused her to be cautious with it, believes L knee is hurting more now d/t compensation. 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 Worst pain 8/10 best 0/10. Denies sensationPt denies N/V, B&B changes, unexplained weight fluctuation, saddle paresthesia, fever, night sweats, or unrelenting night pain at this time.    Limitations Walking;Lifting;Standing;House hold activities    How long can you sit comfortably? less than 40mins    How long can you stand comfortably? 56mins    How long can you walk comfortably? 23mins    Diagnostic tests None    Patient Stated Goals  reciprocal stair pattern    Currently in Pain? Yes    Pain Score 7     Pain Location Knee    Pain Onset More than a month ago            Ther-Ex  -Treadmill walking up 10% grade 1.35mph 53mins min cuing for L knee ext and increased step length with good carry over  - squat jump on total L18 x8, L19x7, L20 2x 6 with min cuing for landing with good carry over    - SL squat with unilateral TRX 2x 10 bilat good carry over following demo   - alt step up/down 12in 3x 30sec with cuing for increased speed, and soft landing with good carry over  - alt speed alt step overs 8in step 3x 30sec  -side lunges 1x12 alternating sides and pushing back to middle     -20# kettlebell swings (held off until next visit)                  PT Education - 09/28/20 1648    Education Details therex form/technique    Person(s) Educated Patient    Methods Explanation;Demonstration;Verbal cues    Comprehension Verbalized understanding;Returned demonstration;Verbal cues required            PT Short Term Goals - 08/04/20 0942      PT SHORT TERM GOAL #1   Title Pt will be independent with HEP in order to decrease ankle pain and increase strength in order to improve pain-free function at home and work.    Baseline 08/04/20 HEP given    Time 5    Period Weeks    Status New             PT Long Term Goals - 08/04/20 0943      PT LONG TERM GOAL #1   Title Pt will decrease worst pain as reported on NPRS by at least 3 points in order to demonstrate clinically significant reduction in ankle/foot pain.    Baseline 08/04/20 8/10    Time 8    Period Weeks    Status New      PT LONG TERM GOAL #2   Title Pt will decrease 5TSTS by at least 3 seconds in order to demonstrate clinically significant improvement in LE strength    Baseline 08/04/20 20sec    Time 8    Period Weeks    Status New      PT LONG TERM GOAL #3   Title Patient will demonstrate gait speed (10MWT) of 1.42m/s or more in  order to demonstrate community ambulator speed    Baseline 08/04/20 0.42m/s    Time 8    Period Weeks    Status New      PT LONG TERM GOAL #4   Title Patient will increase FOTO score to 61 to demonstrate predicted increase in functional mobility  to complete ADLs    Baseline 08/04/20 48    Time 8    Period Weeks    Status New                 Plan - 09/28/20 1728    Clinical Impression Statement PT continued with therex program for BLE endurance, strength, and power. Pt able to progress to higher step height with increased speed and decreased fatigue. Pt able to complete side lunges with cues for proper form and technique to target glutes, hamstrings, and quads for BLE strengthening.  Pt able to complete PT session without an increase in L knee pain. PT will continue to progress as able.    Personal Factors and Comorbidities Age;Comorbidity 1;Comorbidity 2;Fitness;Sex;Profession    Comorbidities HTN, breast cancer    Examination-Activity Limitations Bathing;Lift;Carry;Dressing;Reach Overhead    Examination-Participation Restrictions Laundry;Meal Prep;Yard Work;Community Activity;Driving    Stability/Clinical Decision Making Evolving/Moderate complexity    Clinical Decision Making Moderate    Rehab Potential Good    PT Frequency 2x / week    PT Duration 8 weeks    PT Treatment/Interventions ADLs/Self Care Home Management;Electrical Stimulation;Therapeutic activities;Patient/family education;Taping;Joint Manipulations;Spinal Manipulations;Passive range of motion;Dry needling;Manual techniques;Functional mobility training;Ultrasound;Cryotherapy;Traction;Neuromuscular re-education;Therapeutic exercise;DME Instruction;Moist Heat;Iontophoresis 4mg /ml Dexamethasone    PT Next Visit Plan continue POC    PT Home Exercise Plan [id number]    Consulted and Agree with Plan of Care Patient           Patient will benefit from skilled therapeutic intervention in order to improve the following  deficits and impairments:  Increased fascial restricitons, Pain, Improper body mechanics, Decreased mobility, Decreased scar mobility, Impaired tone, Postural dysfunction, Decreased activity tolerance, Decreased endurance, Decreased range of motion, Decreased strength, Impaired UE functional use, Impaired flexibility, Increased edema  Visit Diagnosis: Chronic pain of left knee  Chronic pain of right knee  Abnormal posture  Other abnormalities of gait and mobility     Problem List Patient Active Problem List   Diagnosis Date Noted  . Morbid obesity with BMI of 40.0-44.9, adult ([place]) 07/01/2020  . History of breast cancer in female 07/01/2020  . Knee pain, bilateral 05/25/2020  . Well woman exam with routine gynecological exam 02/27/2020  . Intermittent tingling sensation of right hand and foot 02/26/2020  . Numbness and tingling in right hand 02/26/2020  . Hypokalemia 02/12/2020  . 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 Chemotherapy-induced neuropathy ([place]) 01/29/2019  . Pyuria 11/07/2018  . Urinary urgency 11/07/2018  . Hyperlipidemia associated with type 2 diabetes mellitus ([place]) 10/02/2018  . Neuropathy due to chemotherapeutic drug ([place]) 10/02/2018  . Myalgia due to statin 10/02/2018  . Controlled type 2 diabetes mellitus with hyperglycemia ([place]) 07/05/2018  . Diverticulosis of large intestine without diverticulitis 03/01/2018  . Personal history of chemotherapy 01/03/2018  . Peripheral neuropathy due to chemotherapy ([place]) 07/04/2017  . Malignant neoplasm of upper-outer quadrant of right breast in female, estrogen receptor positive ([place]) 03/05/2017  . Goals of care, counseling/discussion 03/05/2017  . Essential hypertension 01/05/2016    Durwin Reges DPT [facility], SPT  Durwin Reges 09/29/2020, 5:21 PM  [facility] [address] [place], [address] Phone: [phone]   Fax:   [phone]  Name: ANDEE CHIVERS MRN: [id number] Date of Birth: 1966-03-30

## 2020-09-30 ENCOUNTER — Other Ambulatory Visit: Payer: Self-pay

## 2020-09-30 ENCOUNTER — Telehealth: Payer: Self-pay

## 2020-09-30 ENCOUNTER — Ambulatory Visit: Payer: Medicaid Other | Admitting: Physical Therapy

## 2020-09-30 ENCOUNTER — Encounter: Payer: Self-pay | Admitting: Physical Therapy

## 2020-09-30 DIAGNOSIS — G8929 Other chronic pain: Secondary | ICD-10-CM | POA: Diagnosis not present

## 2020-09-30 DIAGNOSIS — M25562 Pain in left knee: Secondary | ICD-10-CM | POA: Diagnosis not present

## 2020-09-30 DIAGNOSIS — M25561 Pain in right knee: Secondary | ICD-10-CM | POA: Diagnosis not present

## 2020-09-30 DIAGNOSIS — R2689 Other abnormalities of gait and mobility: Secondary | ICD-10-CM

## 2020-09-30 DIAGNOSIS — R293 Abnormal posture: Secondary | ICD-10-CM | POA: Diagnosis not present

## 2020-09-30 NOTE — Therapy (Signed)
[facility] [address], [place], [address] Phone: [phone]   Fax:  [phone]  Physical Therapy Treatment/Progress Note Reporting Period 08/04/20-09/30/20  Patient Details  Name: Chelsea Hudson MRN: [id number] Date of Birth: 1966/10/03 No data recorded  Encounter Date: 09/30/2020   PT End of Session - 09/30/20 1811    Visit Number 12    Number of Visits 17    Date for PT Re-Evaluation 10/01/20    Authorization Time Period 3 visits 08/12/20 - 09/10/20    PT Start Time 0520    PT Stop Time 0602    PT Time Calculation (min) 42 min    Activity Tolerance Patient tolerated treatment well;No increased pain    Behavior During Therapy WFL for tasks assessed/performed           Past Medical History:  Diagnosis Date  . Arthritis    SHOULDER  . Breast cancer ([place]) 02/2017   rt breast  . Breast cancer ([place]) 2018  . Cancer ([place]) 02/28/2017   INVASIVE MAMMARY CARCINOMA WITH MUCINOUS FEATURES.  . Colonic diverticular abscess 06/21/2017   Colonoscopy 06/25/2017: No evidence of malignancy.  . Diabetes mellitus without complication ([place])   . Hypertension   . Irregular heart beat    PT STATES IT "SKIPS A BEAT"   . Obesity   . Personal history of chemotherapy   . Personal history of radiation therapy     Past Surgical History:  Procedure Laterality Date  . ANKLE SURGERY    . BREAST BIOPSY Right 02/28/2017   INVASIVE MAMMARY CARCINOMA WITH MUCINOUS FEATURES.  Marland Kitchen BREAST CYST ASPIRATION Right    NEG  . COLONOSCOPY WITH PROPOFOL N/A 06/25/2017   Procedure: COLONOSCOPY WITH PROPOFOL;  Surgeon: Jonathon Bellows, MD;  Location: [facility] ENDOSCOPY;  Service: Gastroenterology;  Laterality: N/A;  . MASTECTOMY, PARTIAL Right 07/30/2017   Procedure: MASTECTOMY PARTIAL;  Surgeon: Robert Bellow, MD;  Location: [facility] ORS;  Service: General;  Laterality: Right;  . PORTACATH PLACEMENT Left 03/15/2017   Procedure: INSERTION  PORT-A-CATH;  Surgeon: Robert Bellow, MD;  Location: [facility] ORS;  Service: General;  Laterality: Left;  . RE-EXCISION OF BREAST LUMPECTOMY Right 08/17/2017    INVASIVE CARCINOMA EXTENDS TO NEW LATERAL MARGIN. /RE-EXCISION OF BREAST LUMPECTOMY;: Byrnett, Forest Gleason, MD;  [facility] ORS; General;  Laterality: Right;  . SENTINEL NODE BIOPSY Right 07/30/2017   Procedure: SENTINEL NODE BIOPSY;  Surgeon: Robert Bellow, MD;  Location: [facility] ORS;  Service: General;  Laterality: Right;  . SIMPLE MASTECTOMY WITH AXILLARY SENTINEL NODE BIOPSY Right 09/28/2017   Procedure: SIMPLE MASTECTOMY;  Surgeon: Robert Bellow, MD;  Location: [facility] ORS;  Service: General;  Laterality: Right;    There were no vitals filed for this visit.   Subjective Assessment - 09/30/20 1721    Subjective Pt reports L knee is 8/10 and sore after falling out of truck yesterday morning.    Pertinent History Pt is a 54 year old female returning to this clinic for bilat knee pain (familar to this PT for previous shoulder rehab). Chronic bilat knee pain >64months R>L, she had cortisone shots in bilat knees last month, they have felt better following, but continues to have L knee pain "is babying R knee". She reports long standing R knee pain history that has caused her to be cautious with it, believes L knee is hurting more now d/t compensation. 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 Worst pain 8/10 best 0/10. Denies sensationPt denies N/V, B&B changes, unexplained weight fluctuation, saddle paresthesia, fever, night sweats, or unrelenting night pain at this time.    Limitations Walking;Lifting;Standing;House hold activities    How long can you sit comfortably? less than 2mins, 09/30/20- unlimited    How long can you stand comfortably? 72mins    How long can you walk comfortably? 69mins    Diagnostic tests None     Patient Stated Goals reciprocal stair pattern    Currently in Pain? Yes    Pain Score 8     Pain Location Knee    Pain Orientation Left    Pain Onset More than a month ago           Ther-Ex  -Treadmill walking up 10% grade 1.66mph 72mins min cuing for L knee ext and increased step length with good carry over - squat jump on total gym L18 x8; L19x 6 L20 2x 5 with min cuing for landing with good carry over -20# kettlebell swings 1x10 -side lunges 1x10  -5TSTS -10MWT  HEP review verbalized understanding  Gait training  -stairs training w/o holding on to railing with reciprocating pattern, pt is hesitant and has decreased confidence during activity; able to complete 75% toward normalized pattern with unilateral handrail after repeated trials                          PT Education - 09/30/20 1825    Education Details therex form/technique    Methods Explanation;Demonstration;Verbal cues    Comprehension Verbalized understanding;Returned demonstration;Verbal cues required            PT Short Term Goals - 09/30/20 1734      PT SHORT TERM GOAL #1   Title Pt will be independent with HEP in order to decrease knee pain and increase strength in order to improve pain-free function at home and work.    Baseline 08/04/20 HEP given    Time 5    Period Weeks    Status On-going      PT SHORT TERM GOAL #3   Title Pt will be able to demonstrate an alternating step pattern to navigate stair and be able to get in and out of work truck without pain and difficulty.    Baseline 09/30/20 pt demonstrates step to pattern    Time 4    Period Weeks    Status New             PT Long Term Goals - 09/30/20 1729      PT LONG TERM GOAL #1   Title Pt will decrease worst pain as reported on NPRS by at least 3 points in order to demonstrate clinically significant reduction in bilat knees.    Baseline 08/04/20 8/10 09/30/20 4/10    Time 8    Period Weeks    Status Achieved       PT LONG TERM GOAL #2   Title Pt will decrease 5TSTS by at least 3 seconds in order to demonstrate clinically significant improvement in LE strength    Baseline 08/04/20 20sec 09/30/20 14sec    Time 8    Period Weeks    Status Achieved      PT LONG TERM GOAL #3   Title Patient will demonstrate gait speed (10MWT) of 1.8m/s or more in order to demonstrate  community ambulator speed    Baseline 08/04/20 0.60m/s 09/30/20 1.10m/s    Time 8    Status On-going      PT LONG TERM GOAL #4   Title Patient will increase FOTO score to 61 to demonstrate predicted increase in functional mobility to complete ADLs    Baseline 08/04/20 48 09/30/20 74    Time 8    Period Weeks    Status Achieved      PT LONG TERM GOAL #5   Title Pt will demonstrate 5xSTS of 10sec or less in order to demonstrate age matched BLE strength and power    Baseline 09/30/20 14sec    Time 8    Period Weeks    Status New                 Plan - 09/30/20 1801    Clinical Impression Statement Pt able to continue with therex program for BLE strengthening to decrease pain and increase walking and standing tolerance. Pt states the pain has decreased overall in her knees and is mostly painful after activity. Pt is able to complete exercises during PT without an increase in pain and is demonstrating good BLE strength. Pt activity tolerance is limited mostly due to fatigue. PT will continue to focus on endurance and BLE strengthening.    Personal Factors and Comorbidities Age;Comorbidity 1;Comorbidity 2;Fitness;Sex;Profession    Comorbidities HTN, breast cancer    Examination-Activity Limitations Bathing;Lift;Carry;Dressing;Reach Overhead    Examination-Participation Restrictions Laundry;Meal Prep;Yard Work;Community Activity;Driving    Stability/Clinical Decision Making Evolving/Moderate complexity    Clinical Decision Making Moderate    Rehab Potential Good    PT Frequency 2x / week    PT Duration 8 weeks    PT  Treatment/Interventions ADLs/Self Care Home Management;Electrical Stimulation;Therapeutic activities;Patient/family education;Taping;Joint Manipulations;Spinal Manipulations;Passive range of motion;Dry needling;Manual techniques;Functional mobility training;Ultrasound;Cryotherapy;Traction;Neuromuscular re-education;Therapeutic exercise;DME Instruction;Moist Heat;Iontophoresis 4mg /ml Dexamethasone    PT Next Visit Plan continue POC    PT Home Exercise Plan [id number]    Consulted and Agree with Plan of Care Patient           Patient will benefit from skilled therapeutic intervention in order to improve the following deficits and impairments:  Increased fascial restricitons, Pain, Improper body mechanics, Decreased mobility, Decreased scar mobility, Impaired tone, Postural dysfunction, Decreased activity tolerance, Decreased endurance, Decreased range of motion, Decreased strength, Impaired UE functional use, Impaired flexibility, Increased edema  Visit Diagnosis: Chronic pain of left knee  Chronic pain of right knee  Abnormal posture  Other abnormalities of gait and mobility     Problem List Patient Active Problem List   Diagnosis Date Noted  . Morbid obesity with BMI of 40.0-44.9, adult ([place]) 07/01/2020  . History of breast cancer in female 07/01/2020  . Knee pain, bilateral 05/25/2020  . Well woman exam with routine gynecological exam 02/27/2020  . Intermittent tingling sensation of right hand and foot 02/26/2020  . Numbness and tingling in right hand 02/26/2020  . Hypokalemia 02/12/2020  . 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 Chemotherapy-induced neuropathy ([place]) 01/29/2019  . Pyuria 11/07/2018  . Urinary urgency 11/07/2018  . Hyperlipidemia associated with type 2 diabetes mellitus ([place]) 10/02/2018  . Neuropathy due to chemotherapeutic drug ([place]) 10/02/2018  . Myalgia due to statin 10/02/2018  . Controlled type 2 diabetes mellitus with hyperglycemia ([place]) 07/05/2018  .  Diverticulosis of large intestine without diverticulitis 03/01/2018  . Personal history of chemotherapy 01/03/2018  . Peripheral neuropathy due to chemotherapy ([place]) 07/04/2017  . Malignant neoplasm of  upper-outer quadrant of right breast in female, estrogen receptor positive ([place]) 03/05/2017  . Goals of care, counseling/discussion 03/05/2017  . Essential hypertension 01/05/2016    Durwin Reges DPT [facility], SPT Durwin Reges 10/01/2020, 10:03 AM  [place] [facility] [facility] [address] [place], [address] Phone: [phone]   Fax:  [phone]  Name: CALEDONIA ZOU MRN: [id number] Date of Birth: 07-04-1966

## 2020-09-30 NOTE — Telephone Encounter (Signed)
Copied from [place] [id number]. Topic: General - Other >> Sep 29, 2020 11:13 AM Celene Kras wrote: Reason for CRM: Pt called and is requesting to speak with a nurse regarding her medications and when she should set up next appt. Please advise.  Called pt schedule appt for 10/05/20 for DM/ HTN.  KP

## 2020-10-04 ENCOUNTER — Encounter: Payer: Disability Insurance | Admitting: Physical Therapy

## 2020-10-04 NOTE — Progress Notes (Deleted)
Pt present for weight management. Pt's last visit 08/03/2020 wt 251 lb.

## 2020-10-05 ENCOUNTER — Ambulatory Visit (INDEPENDENT_AMBULATORY_CARE_PROVIDER_SITE_OTHER): Payer: Medicaid Other | Admitting: Obstetrics and Gynecology

## 2020-10-05 ENCOUNTER — Encounter: Payer: Medicaid Other | Admitting: Obstetrics and Gynecology

## 2020-10-05 ENCOUNTER — Encounter: Payer: Self-pay | Admitting: Obstetrics and Gynecology

## 2020-10-05 ENCOUNTER — Other Ambulatory Visit: Payer: Self-pay

## 2020-10-05 ENCOUNTER — Ambulatory Visit: Payer: Self-pay | Admitting: Family Medicine

## 2020-10-05 VITALS — BP 103/73 | HR 111 | Ht 67.0 in | Wt 240.3 lb

## 2020-10-05 DIAGNOSIS — E669 Obesity, unspecified: Secondary | ICD-10-CM

## 2020-10-05 DIAGNOSIS — Z7689 Persons encountering health services in other specified circumstances: Secondary | ICD-10-CM

## 2020-10-05 DIAGNOSIS — I10 Essential (primary) hypertension: Secondary | ICD-10-CM

## 2020-10-05 DIAGNOSIS — E1165 Type 2 diabetes mellitus with hyperglycemia: Secondary | ICD-10-CM

## 2020-10-05 NOTE — Progress Notes (Signed)
Pt present for weight management. Pt stated she feels wired after starting phentermine.

## 2020-10-05 NOTE — Progress Notes (Signed)
    GYNECOLOGY PROGRESS NOTE  Subjective:    Patient ID: Chelsea Hudson, female    DOB: 02-21-1966, 54 y.o.   MRN: [id number]  HPI  Patient is a 54 y.o. G0P0 female who presents for 1 month weight management follow up. She has had her first visit with the Nutritionist 2 weeks ago. She also trialed Phentermine for 1 month. Noted side effects of feeling "wired" initially and dry mouth. Has been taking the medication every other day. Has lost 4 lbs since visit to nutritionist. Starting weight at onset of weight loss journey was 255 lbs (with control of DM).    Current interventions:  1. Diet - Has breakfast (veggie smoothie). Has a snack but skips lunch. Has a "sensible dinner".  Has started using air fryer.  2. Activity - 1 day of physical therapy, and does dance class 2 days per week.  3. Reports bowel movements are normal.    The following portions of the patient's history were reviewed and updated as appropriate: allergies, current medications, past family history, past medical history, past social history, past surgical history and problem list.  Review of Systems Pertinent items noted in HPI and remainder of comprehensive ROS otherwise negative.   Objective:    Vitals with BMI 10/05/2020 09/20/2020 09/13/2020  Height 5\' 7"  5\' 7"  -  Weight 240 lbs 5 oz 244 lbs 8 oz 244 lbs 8 oz  BMI 37.63 70.26 37.85  Systolic 885 027 -  Diastolic 73 80 -  Pulse 741 - -    General appearance: alert and no distress Abdomen: soft, non-tender.  Waist circumference 49 in.    Labs:   Assessment:   Weight management Obesity, Body mass index is 37.64 kg/m.  DM  Essential HTN   Plan:   1. Advised to continue management of diet, counseled on strict adherence to dietary recommendations to maximize weight loss. Also strongly encouraged a regimented exercise routine.  Discussed option of trying a different medication for her weight loss in light of her medical conditions, or can decrease dose of  Phentermine to be able to take medication every day (decrease to 30 mg from 37.5 mg). Will have  2. DM and HTN, currently managed by PCP.   F/u in 1 month for weight check.    A total of 15 minutes were spent face-to-face with the patient during this encounter and over half of that time dealt with counseling and coordination of care.   Rubie Maid, MD [facility]

## 2020-10-07 ENCOUNTER — Other Ambulatory Visit: Payer: Self-pay

## 2020-10-07 ENCOUNTER — Ambulatory Visit: Payer: Medicaid Other | Admitting: Physical Therapy

## 2020-10-07 ENCOUNTER — Encounter: Payer: Disability Insurance | Admitting: Physical Therapy

## 2020-10-07 ENCOUNTER — Encounter: Payer: Self-pay | Admitting: Physical Therapy

## 2020-10-07 DIAGNOSIS — R2689 Other abnormalities of gait and mobility: Secondary | ICD-10-CM | POA: Diagnosis not present

## 2020-10-07 DIAGNOSIS — R293 Abnormal posture: Secondary | ICD-10-CM | POA: Diagnosis not present

## 2020-10-07 DIAGNOSIS — G8929 Other chronic pain: Secondary | ICD-10-CM

## 2020-10-07 DIAGNOSIS — M25562 Pain in left knee: Secondary | ICD-10-CM | POA: Diagnosis not present

## 2020-10-07 DIAGNOSIS — M25561 Pain in right knee: Secondary | ICD-10-CM | POA: Diagnosis not present

## 2020-10-07 NOTE — Therapy (Signed)
[facility] [address] [place], [address] Phone: [phone]   Fax:  [phone]  Physical Therapy Treatment  Patient Details  Name: Chelsea Hudson MRN: [id number] Date of Birth: 16-Apr-1966 No data recorded  Encounter Date: 10/07/2020   PT End of Session - 10/07/20 1729    Visit Number 13    Number of Visits 17    Date for PT Re-Evaluation 10/01/20    Authorization Time Period 3 visits 08/12/20 - 09/10/20    PT Start Time 0500    PT Stop Time 0538    PT Time Calculation (min) 38 min    Activity Tolerance Patient tolerated treatment well;No increased pain    Behavior During Therapy WFL for tasks assessed/performed           Past Medical History:  Diagnosis Date  . Arthritis    SHOULDER  . Breast cancer ([place]) 02/2017   rt breast  . Breast cancer ([place]) 2018  . Cancer ([place]) 02/28/2017   INVASIVE MAMMARY CARCINOMA WITH MUCINOUS FEATURES.  . Colonic diverticular abscess 06/21/2017   Colonoscopy 06/25/2017: No evidence of malignancy.  . Diabetes mellitus without complication ([place])   . Hypertension   . Irregular heart beat    PT STATES IT "SKIPS A BEAT"   . Obesity   . Personal history of chemotherapy   . Personal history of radiation therapy     Past Surgical History:  Procedure Laterality Date  . ANKLE SURGERY    . BREAST BIOPSY Right 02/28/2017   INVASIVE MAMMARY CARCINOMA WITH MUCINOUS FEATURES.  Marland Kitchen BREAST CYST ASPIRATION Right    NEG  . COLONOSCOPY WITH PROPOFOL N/A 06/25/2017   Procedure: COLONOSCOPY WITH PROPOFOL;  Surgeon: Jonathon Bellows, MD;  Location: [facility] ENDOSCOPY;  Service: Gastroenterology;  Laterality: N/A;  . MASTECTOMY, PARTIAL Right 07/30/2017   Procedure: MASTECTOMY PARTIAL;  Surgeon: Robert Bellow, MD;  Location: [facility] ORS;  Service: General;  Laterality: Right;  . PORTACATH PLACEMENT Left 03/15/2017   Procedure: INSERTION PORT-A-CATH;  Surgeon: Robert Bellow, MD;   Location: [facility] ORS;  Service: General;  Laterality: Left;  . RE-EXCISION OF BREAST LUMPECTOMY Right 08/17/2017    INVASIVE CARCINOMA EXTENDS TO NEW LATERAL MARGIN. /RE-EXCISION OF BREAST LUMPECTOMY;: Byrnett, Forest Gleason, MD;  [facility] ORS; General;  Laterality: Right;  . SENTINEL NODE BIOPSY Right 07/30/2017   Procedure: SENTINEL NODE BIOPSY;  Surgeon: Robert Bellow, MD;  Location: [facility] ORS;  Service: General;  Laterality: Right;  . SIMPLE MASTECTOMY WITH AXILLARY SENTINEL NODE BIOPSY Right 09/28/2017   Procedure: SIMPLE MASTECTOMY;  Surgeon: Robert Bellow, MD;  Location: [facility] ORS;  Service: General;  Laterality: Right;    There were no vitals filed for this visit.   Subjective Assessment - 10/07/20 1705    Subjective Patient reports no pain today because she did not have to climb in and out of the truck for work today.    Pertinent History Pt is a 54 year old female returning to this clinic for bilat knee pain (familar to this PT for previous shoulder rehab). Chronic bilat knee pain >42months R>L, she had cortisone shots in bilat knees last month, they have felt better following, but continues to have L knee pain "is babying R knee". She reports long standing R knee pain history that has caused her to be cautious with it, believes L knee is hurting more now d/t compensation. 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 Worst pain 8/10 best 0/10. Denies sensationPt denies N/V, B&B changes, unexplained weight fluctuation, saddle paresthesia, fever, night sweats, or unrelenting night pain at this time.    Limitations Walking;Lifting;Standing;House hold activities    How long can you sit comfortably? less than 65mins, 09/30/20- unlimited    How long can you stand comfortably? 77mins    How long can you walk comfortably? 72mins    Diagnostic tests None    Patient Stated Goals  reciprocal stair pattern    Currently in Pain? No/denies    Pain Onset More than a month ago            Ther-Ex-  -Treadmill walking up 10% grade 1.33mph 25mins min cuing for L knee ext and increased step length with good carry over  -squat jump on total L18 x10; L19x 8 L20 2x 6 with min cuing for landing with good carry over  -Single leg squats 2x10 bilat   -Single leg RDLs 1x10 bilat with 10# dumbbell to correct trunk rotation                           PT Education - 10/07/20 1728    Education Details therex form/technique    Person(s) Educated Patient    Methods Explanation;Demonstration;Verbal cues    Comprehension Verbalized understanding;Returned demonstration;Verbal cues required            PT Short Term Goals - 09/30/20 1734      PT SHORT TERM GOAL #1   Title Pt will be independent with HEP in order to decrease knee pain and increase strength in order to improve pain-free function at home and work.    Baseline 08/04/20 HEP given    Time 5    Period Weeks    Status On-going      PT SHORT TERM GOAL #3   Title Pt will be able to demonstrate an alternating step pattern to navigate stair and be able to get in and out of work truck without pain and difficulty.    Baseline 09/30/20 pt demonstrates step to pattern    Time 4    Period Weeks    Status New             PT Long Term Goals - 09/30/20 1729      PT LONG TERM GOAL #1   Title Pt will decrease worst pain as reported on NPRS by at least 3 points in order to demonstrate clinically significant reduction in bilat knees.    Baseline 08/04/20 8/10 09/30/20 4/10    Time 8    Period Weeks    Status Achieved      PT LONG TERM GOAL #2   Title Pt will decrease 5TSTS by at least 3 seconds in order to demonstrate clinically significant improvement in LE strength    Baseline 08/04/20 20sec 09/30/20 14sec    Time 8    Period Weeks    Status Achieved      PT LONG TERM GOAL #3   Title Patient  will demonstrate gait speed (10MWT) of 1.40m/s or more in order to demonstrate community ambulator speed    Baseline 08/04/20 0.33m/s 09/30/20 1.75m/s    Time 8    Status On-going      PT LONG TERM GOAL #4   Title Patient will increase FOTO score to 61  to demonstrate predicted increase in functional mobility to complete ADLs    Baseline 08/04/20 48 09/30/20 74    Time 8    Period Weeks    Status Achieved      PT LONG TERM GOAL #5   Title Pt will demonstrate 5xSTS of 10sec or less in order to demonstrate age matched BLE strength and power    Baseline 09/30/20 14sec    Time 8    Period Weeks    Status New                 Plan - 10/07/20 1738    Clinical Impression Statement Pt returns to therapy with no pain in knees so PT session focused on BLE strengthening. Pt states her pain is mostly present when having to work all day and climb in and out of truck. Pt able to tolerate all strenthening activities with rest breaks in between sets and no pain. PT will continue to work on BLE strengthening to increase walking and standing tolerance needed to complete full work days with no pain. PT will continue to progress as able.    Personal Factors and Comorbidities Age;Comorbidity 1;Comorbidity 2;Fitness;Sex;Profession    Comorbidities HTN, breast cancer    Examination-Activity Limitations Bathing;Lift;Carry;Dressing;Reach Overhead    Examination-Participation Restrictions Laundry;Meal Prep;Yard Work;Community Activity;Driving    Stability/Clinical Decision Making Evolving/Moderate complexity    Clinical Decision Making Moderate    Rehab Potential Good    PT Frequency 2x / week    PT Duration 8 weeks    PT Treatment/Interventions ADLs/Self Care Home Management;Electrical Stimulation;Therapeutic activities;Patient/family education;Taping;Joint Manipulations;Spinal Manipulations;Passive range of motion;Dry needling;Manual techniques;Functional mobility  training;Ultrasound;Cryotherapy;Traction;Neuromuscular re-education;Therapeutic exercise;DME Instruction;Moist Heat;Iontophoresis 4mg /ml Dexamethasone    PT Next Visit Plan continue POC    PT Home Exercise Plan [id number]           Patient will benefit from skilled therapeutic intervention in order to improve the following deficits and impairments:  Increased fascial restricitons, Pain, Improper body mechanics, Decreased mobility, Decreased scar mobility, Impaired tone, Postural dysfunction, Decreased activity tolerance, Decreased endurance, Decreased range of motion, Decreased strength, Impaired UE functional use, Impaired flexibility, Increased edema  Visit Diagnosis: Chronic pain of left knee  Chronic pain of right knee  Abnormal posture  Other abnormalities of gait and mobility     Problem List Patient Active Problem List   Diagnosis Date Noted  . Morbid obesity with BMI of 40.0-44.9, adult ([place]) 07/01/2020  . History of breast cancer in female 07/01/2020  . Knee pain, bilateral 05/25/2020  . Well woman exam with routine gynecological exam 02/27/2020  . Intermittent tingling sensation of right hand and foot 02/26/2020  . Numbness and tingling in right hand 02/26/2020  . Hypokalemia 02/12/2020  . 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 Chemotherapy-induced neuropathy ([place]) 01/29/2019  . Pyuria 11/07/2018  . Urinary urgency 11/07/2018  . Hyperlipidemia associated with type 2 diabetes mellitus ([place]) 10/02/2018  . Neuropathy due to chemotherapeutic drug ([place]) 10/02/2018  . Myalgia due to statin 10/02/2018  . Controlled type 2 diabetes mellitus with hyperglycemia ([place]) 07/05/2018  . Diverticulosis of large intestine without diverticulitis 03/01/2018  . Personal history of chemotherapy 01/03/2018  . Peripheral neuropathy due to chemotherapy ([place]) 07/04/2017  . Malignant neoplasm of upper-outer quadrant of right breast in female, estrogen receptor positive ([place]) 03/05/2017  .  Goals of care, counseling/discussion 03/05/2017  . Essential hypertension 01/05/2016    Durwin Reges DPT [facility], SPT Durwin Reges 10/08/2020, 9:46 AM  [place] [facility] [address] [place], [address] Phone: [phone]   Fax:  [phone]  Name: PAULEEN GOLEMAN MRN: [id number] Date of Birth: 10-19-1966

## 2020-10-12 ENCOUNTER — Ambulatory Visit: Payer: Medicaid Other | Admitting: Physical Therapy

## 2020-10-12 ENCOUNTER — Other Ambulatory Visit: Payer: Self-pay

## 2020-10-12 ENCOUNTER — Encounter: Payer: Self-pay | Admitting: Physical Therapy

## 2020-10-12 DIAGNOSIS — M25561 Pain in right knee: Secondary | ICD-10-CM | POA: Diagnosis not present

## 2020-10-12 DIAGNOSIS — M25562 Pain in left knee: Secondary | ICD-10-CM | POA: Diagnosis not present

## 2020-10-12 DIAGNOSIS — R293 Abnormal posture: Secondary | ICD-10-CM | POA: Diagnosis not present

## 2020-10-12 DIAGNOSIS — R2689 Other abnormalities of gait and mobility: Secondary | ICD-10-CM

## 2020-10-12 DIAGNOSIS — G8929 Other chronic pain: Secondary | ICD-10-CM

## 2020-10-12 NOTE — Therapy (Signed)
[facility] [address], [place], [address] Phone: [phone]   Fax:  [phone]  Physical Therapy Treatment  Patient Details  Name: Chelsea Hudson MRN: [id number] Date of Birth: 26-Jul-1966 No data recorded  Encounter Date: 10/12/2020   PT End of Session - 10/12/20 1814    Visit Number 14    Number of Visits 17    Date for PT Re-Evaluation 10/01/20    Authorization Time Period 3 visits 08/12/20 - 09/10/20    PT Start Time 0543    PT Stop Time 0615    PT Time Calculation (min) 32 min    Activity Tolerance Patient tolerated treatment well;No increased pain    Behavior During Therapy WFL for tasks assessed/performed           Past Medical History:  Diagnosis Date  . Arthritis    SHOULDER  . Breast cancer ([place]) 02/2017   rt breast  . Breast cancer ([place]) 2018  . Cancer ([place]) 02/28/2017   INVASIVE MAMMARY CARCINOMA WITH MUCINOUS FEATURES.  . Colonic diverticular abscess 06/21/2017   Colonoscopy 06/25/2017: No evidence of malignancy.  . Diabetes mellitus without complication ([place])   . Hypertension   . Irregular heart beat    PT STATES IT "SKIPS A BEAT"   . Obesity   . Personal history of chemotherapy   . Personal history of radiation therapy     Past Surgical History:  Procedure Laterality Date  . ANKLE SURGERY    . BREAST BIOPSY Right 02/28/2017   INVASIVE MAMMARY CARCINOMA WITH MUCINOUS FEATURES.  Marland Kitchen BREAST CYST ASPIRATION Right    NEG  . COLONOSCOPY WITH PROPOFOL N/A 06/25/2017   Procedure: COLONOSCOPY WITH PROPOFOL;  Surgeon: Jonathon Bellows, MD;  Location: [facility] ENDOSCOPY;  Service: Gastroenterology;  Laterality: N/A;  . MASTECTOMY, PARTIAL Right 07/30/2017   Procedure: MASTECTOMY PARTIAL;  Surgeon: Robert Bellow, MD;  Location: [facility] ORS;  Service: General;  Laterality: Right;  . PORTACATH PLACEMENT Left 03/15/2017   Procedure: INSERTION PORT-A-CATH;  Surgeon: Robert Bellow, MD;   Location: [facility] ORS;  Service: General;  Laterality: Left;  . RE-EXCISION OF BREAST LUMPECTOMY Right 08/17/2017    INVASIVE CARCINOMA EXTENDS TO NEW LATERAL MARGIN. /RE-EXCISION OF BREAST LUMPECTOMY;: Byrnett, Forest Gleason, MD;  [facility] ORS; General;  Laterality: Right;  . SENTINEL NODE BIOPSY Right 07/30/2017   Procedure: SENTINEL NODE BIOPSY;  Surgeon: Robert Bellow, MD;  Location: [facility] ORS;  Service: General;  Laterality: Right;  . SIMPLE MASTECTOMY WITH AXILLARY SENTINEL NODE BIOPSY Right 09/28/2017   Procedure: SIMPLE MASTECTOMY;  Surgeon: Robert Bellow, MD;  Location: [facility] ORS;  Service: General;  Laterality: Right;    There were no vitals filed for this visit.   Subjective Assessment - 10/12/20 1746    Subjective Patient reports no pain in knees today and only had to climb in and out the truck 3 times today.    Pertinent History Pt is a 54 year old female returning to this clinic for bilat knee pain (familar to this PT for previous shoulder rehab). Chronic bilat knee pain >66months R>L, she had cortisone shots in bilat knees last month, they have felt better following, but continues to have L knee pain "is babying R knee". She reports long standing R knee pain history that has caused her to be cautious with it, believes L knee is hurting more now d/t compensation. 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 Worst pain 8/10 best 0/10. Denies sensationPt denies N/V, B&B changes, unexplained weight fluctuation, saddle paresthesia, fever, night sweats, or unrelenting night pain at this time.            Pt was late to appointment   Ther-Ex -squat jump on total L18 x8; L19x7 L20 2x6 with min cuing for landing with good carry over    -side lunges with sliders 2x12   -Ant/Post step up and down 2x12 bilat with cues to control and lightly tap heel                      PT Short Term Goals - 09/30/20 1734      PT SHORT TERM GOAL #1   Title Pt will be independent with HEP in order to decrease knee pain and increase strength in order to improve pain-free function at home and work.    Baseline 08/04/20 HEP given    Time 5    Period Weeks    Status On-going      PT SHORT TERM GOAL #3   Title Pt will be able to demonstrate an alternating step pattern to navigate stair and be able to get in and out of work truck without pain and difficulty.    Baseline 09/30/20 pt demonstrates step to pattern    Time 4    Period Weeks    Status New             PT Long Term Goals - 09/30/20 1729      PT LONG TERM GOAL #1   Title Pt will decrease worst pain as reported on NPRS by at least 3 points in order to demonstrate clinically significant reduction in bilat knees.    Baseline 08/04/20 8/10 09/30/20 4/10    Time 8    Period Weeks    Status Achieved      PT LONG TERM GOAL #2   Title Pt will decrease 5TSTS by at least 3 seconds in order to demonstrate clinically significant improvement in LE strength    Baseline 08/04/20 20sec 09/30/20 14sec    Time 8    Period Weeks    Status Achieved      PT LONG TERM GOAL #3   Title Patient will demonstrate gait speed (10MWT) of 1.34m/s or more in order to demonstrate community ambulator speed    Baseline 08/04/20 0.40m/s 09/30/20 1.71m/s    Time 8    Status On-going      PT LONG TERM GOAL #4   Title Patient will increase FOTO score to 61 to demonstrate predicted increase in functional mobility to complete ADLs    Baseline 08/04/20 48 09/30/20 74    Time 8    Period Weeks    Status Achieved      PT LONG TERM GOAL #5   Title Pt will demonstrate 5xSTS of 10sec or less in order to demonstrate age matched BLE strength and power    Baseline 09/30/20 14sec    Time 8    Period Weeks    Status New                 Plan - 10/12/20 1814    Clinical Impression Statement Pt  continues to present with no pain in BLE and is able to complete exercises to focus on strengthening hip and knee muscles. Pt able to  complete lateral lunge with cues to push hips back and keep lunging leg in line and not pushing posterior. Patient able to complete squat jumps with more ease and less rest breaks in between. PT will progress as able.    Personal Factors and Comorbidities Age;Comorbidity 1;Comorbidity 2;Fitness;Sex;Profession    Comorbidities HTN, breast cancer    Examination-Activity Limitations Bathing;Lift;Carry;Dressing;Reach Overhead    Examination-Participation Restrictions Laundry;Meal Prep;Yard Work;Community Activity;Driving    Stability/Clinical Decision Making Evolving/Moderate complexity    Clinical Decision Making Moderate    Rehab Potential Good    PT Frequency 2x / week    PT Duration 8 weeks    PT Treatment/Interventions ADLs/Self Care Home Management;Electrical Stimulation;Therapeutic activities;Patient/family education;Taping;Joint Manipulations;Spinal Manipulations;Passive range of motion;Dry needling;Manual techniques;Functional mobility training;Ultrasound;Cryotherapy;Traction;Neuromuscular re-education;Therapeutic exercise;DME Instruction;Moist Heat;Iontophoresis 4mg /ml Dexamethasone    PT Next Visit Plan continue POC    PT Home Exercise Plan [id number]    Consulted and Agree with Plan of Care Patient           Patient will benefit from skilled therapeutic intervention in order to improve the following deficits and impairments:  Increased fascial restricitons, Pain, Improper body mechanics, Decreased mobility, Decreased scar mobility, Impaired tone, Postural dysfunction, Decreased activity tolerance, Decreased endurance, Decreased range of motion, Decreased strength, Impaired UE functional use, Impaired flexibility, Increased edema  Visit Diagnosis: Chronic pain of left knee  Chronic pain of right knee  Other abnormalities of gait and  mobility  Abnormal posture     Problem List Patient Active Problem List   Diagnosis Date Noted  . Morbid obesity with BMI of 40.0-44.9, adult ([place]) 07/01/2020  . History of breast cancer in female 07/01/2020  . Knee pain, bilateral 05/25/2020  . Well woman exam with routine gynecological exam 02/27/2020  . Intermittent tingling sensation of right hand and foot 02/26/2020  . Numbness and tingling in right hand 02/26/2020  . Hypokalemia 02/12/2020  . 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 Chemotherapy-induced neuropathy ([place]) 01/29/2019  . Pyuria 11/07/2018  . Urinary urgency 11/07/2018  . Hyperlipidemia associated with type 2 diabetes mellitus ([place]) 10/02/2018  . Neuropathy due to chemotherapeutic drug ([place]) 10/02/2018  . Myalgia due to statin 10/02/2018  . Controlled type 2 diabetes mellitus with hyperglycemia ([place]) 07/05/2018  . Diverticulosis of large intestine without diverticulitis 03/01/2018  . Personal history of chemotherapy 01/03/2018  . Peripheral neuropathy due to chemotherapy ([place]) 07/04/2017  . Malignant neoplasm of upper-outer quadrant of right breast in female, estrogen receptor positive ([place]) 03/05/2017  . Goals of care, counseling/discussion 03/05/2017  . Essential hypertension 01/05/2016    Durwin Reges DPT [facility], SPT Durwin Reges 10/12/2020, 6:18 PM  [facility] [address] [place], [address] Phone: [phone]   Fax:  [phone]  Name: Chelsea Hudson MRN: [id number] Date of Birth: 1966/07/22

## 2020-10-18 ENCOUNTER — Encounter: Payer: Self-pay | Admitting: Family Medicine

## 2020-10-18 ENCOUNTER — Other Ambulatory Visit: Payer: Self-pay

## 2020-10-18 ENCOUNTER — Ambulatory Visit
Admission: RE | Admit: 2020-10-18 | Discharge: 2020-10-18 | Disposition: A | Discharge status: Home / Self Care | Payer: Medicaid Other | Source: Ambulatory Visit | Attending: Hematology and Oncology | Admitting: Hematology and Oncology

## 2020-10-18 ENCOUNTER — Ambulatory Visit (INDEPENDENT_AMBULATORY_CARE_PROVIDER_SITE_OTHER): Payer: Medicaid Other | Admitting: Family Medicine

## 2020-10-18 VITALS — BP 129/85 | HR 97 | Resp 17 | Ht 67.0 in | Wt 238.6 lb

## 2020-10-18 DIAGNOSIS — G620 Drug-induced polyneuropathy: Secondary | ICD-10-CM | POA: Diagnosis not present

## 2020-10-18 DIAGNOSIS — Z853 Personal history of malignant neoplasm of breast: Secondary | ICD-10-CM | POA: Insufficient documentation

## 2020-10-18 DIAGNOSIS — E1165 Type 2 diabetes mellitus with hyperglycemia: Secondary | ICD-10-CM

## 2020-10-18 DIAGNOSIS — E876 Hypokalemia: Secondary | ICD-10-CM

## 2020-10-18 DIAGNOSIS — I10 Essential (primary) hypertension: Secondary | ICD-10-CM | POA: Diagnosis not present

## 2020-10-18 DIAGNOSIS — Z170 Estrogen receptor positive status [ER+]: Secondary | ICD-10-CM

## 2020-10-18 DIAGNOSIS — T451X5A Adverse effect of antineoplastic and immunosuppressive drugs, initial encounter: Secondary | ICD-10-CM

## 2020-10-18 DIAGNOSIS — Z1231 Encounter for screening mammogram for malignant neoplasm of breast: Secondary | ICD-10-CM | POA: Insufficient documentation

## 2020-10-18 LAB — POCT GLYCOSYLATED HEMOGLOBIN (HGB A1C): Hemoglobin A1C: 6.7 % — AB (ref 4.0–5.6)

## 2020-10-18 MED ORDER — POTASSIUM CHLORIDE ER 10 MEQ PO TBCR: 10.0000 meq | EXTENDED_RELEASE_TABLET | Freq: Two times a day (BID) | ORAL | 1 refills | Status: DC

## 2020-10-18 MED ORDER — TRIAMTERENE-HCTZ 37.5-25 MG PO TABS: 1.0000 | ORAL_TABLET | Freq: Every day | ORAL | 1 refills | Status: DC

## 2020-10-18 MED ORDER — GABAPENTIN 400 MG PO CAPS: 400.0000 mg | ORAL_CAPSULE | Freq: Every day | ORAL | 1 refills | Status: DC

## 2020-10-18 MED ORDER — METFORMIN HCL 500 MG PO TABS: ORAL_TABLET | ORAL | 1 refills | Status: DC

## 2020-10-18 NOTE — Progress Notes (Signed)
Subjective:    Patient ID: Chelsea Hudson, female    DOB: 06-Oct-1966, 54 y.o.   MRN: [id number]  Chelsea Hudson is a 54 y.o. female presenting on 10/18/2020 for Diabetes and Hypertension   HPI   Diabetes Pt presents today for follow up Type 2 Diabetes Mellitus.  He/she (caps): She ACTION; IS/IS NOT: is not checking AM CBG at home. -Current diabetic medications include: metformin 1000mg  in AM and 500mg  in PM -ACTION; IS/IS NOT: is not currently symptomatic -Actions; denies/reports/admits to: denies polydipsia, polyphagia, polyuria, headaches, diaphoresis, shakiness, chills, pain, numbness or tingling in extremities or changes in vision -Clinical course has been improving  -Reports no exercise routine -Diet is moderate in salt, moderate in fat, and moderate in carbohydrates  PREVENTION Eye exam current (within 1 year) Due, encouraged Foot exam current (within 1 year) Up to date Lipid/ASCVD risk reduction - on statin: YES/NO: No  Kidney Protection (On ACE/ARB)? YES/NO: No   Hypertension - She is not checking BP at home or outside of clinic.    - Current medications: triamterene-hydrochlorothiazide 37.5-25mg  daily, tolerating well without side effects - She is not currently symptomatic. - Pt denies headache, lightheadedness, dizziness, changes in vision, chest tightness/pressure, palpitations, leg swelling, sudden loss of speech or loss of consciousness. - She  reports no regular exercise routine. - Her diet is moderate in salt, moderate in fat, and moderate in carbohydrates.  Depression screen [facility] 2/9 08/24/2020 12/03/2019 09/26/2018  Decreased Interest 0 0 0  Down, Depressed, Hopeless 0 0 0  PHQ - 2 Score 0 0 0  Some recent data might be hidden    Social History   Tobacco Use  . Smoking status: Former Smoker    Packs/day: 0.25    Years: 25.00    Pack years: 6.25    Types: Cigarettes    Quit date: 10/20/2017    Years since quitting: 2.9  . Smokeless tobacco: Former Health visitor  . Vaping Use: Never used  Substance Use Topics  . Alcohol use: Not Currently    Comment: occas  . Drug use: No    Review of Systems  Constitutional: Negative.   HENT: Negative.   Eyes: Negative.   Respiratory: Negative.   Cardiovascular: Negative.   Gastrointestinal: Negative.   Endocrine: Negative.   Genitourinary: Negative.   Musculoskeletal: Negative.   Skin: Negative.   Allergic/Immunologic: Negative.   Neurological: Negative.   Hematological: Negative.   Psychiatric/Behavioral: Negative.    Per HPI unless specifically indicated above     Objective:    BP 129/85 (BP Location: Left Arm, Patient Position: Sitting, Cuff Size: Large)   Pulse 97   Resp 17   Ht 5\' 7"  (1.702 m)   Wt 238 lb 9.6 oz (108.2 kg)   LMP 01/31/2014 (Approximate) Comment: LMP was 3 years ago.  SpO2 100%   BMI 37.37 kg/m   Wt Readings from Last 3 Encounters:  10/18/20 238 lb 9.6 oz (108.2 kg)  10/05/20 240 lb 4.8 oz (109 kg)  09/20/20 244 lb 8 oz (110.9 kg)    Physical Exam Vitals and nursing note reviewed.  Constitutional:      General: She is not in acute distress.    Appearance: Normal appearance. She is well-developed and well-groomed. She is obese. She is not ill-appearing or toxic-appearing.  HENT:     Head: Normocephalic and atraumatic.     Nose:     Comments: Chelsea Hudson is in place, covering mouth  and nose. Eyes:     General: Lids are normal. Vision grossly intact.        Right eye: No discharge.        Left eye: No discharge.     Extraocular Movements: Extraocular movements intact.     Conjunctiva/sclera: Conjunctivae normal.     Pupils: Pupils are equal, round, and reactive to light.  Cardiovascular:     Rate and Rhythm: Normal rate and regular rhythm.     Pulses: Normal pulses.     Heart sounds: Normal heart sounds. No murmur heard.  No friction rub. No gallop.   Pulmonary:     Effort: Pulmonary effort is normal. No respiratory distress.     Breath sounds:  Normal breath sounds.  Musculoskeletal:     Right lower leg: No edema.     Left lower leg: No edema.  Skin:    General: Skin is warm and dry.     Capillary Refill: Capillary refill takes less than 2 seconds.  Neurological:     General: No focal deficit present.     Mental Status: She is alert and oriented to person, place, and time.  Psychiatric:        Attention and Perception: Attention and perception normal.        Mood and Affect: Mood and affect normal.        Speech: Speech normal.        Behavior: Behavior normal. Behavior is cooperative.        Thought Content: Thought content normal.        Cognition and Memory: Cognition and memory normal.        Judgment: Judgment normal.    Results for orders placed or performed in visit on 10/18/20  POCT glycosylated hemoglobin (Hb A1C)  Result Value Ref Range   Hemoglobin A1C 6.7 (A) 4.0 - 5.6 %   HbA1c POC (<> result, manual entry)     HbA1c, POC (prediabetic range)     HbA1c, POC (controlled diabetic range)        Assessment & Plan:   Problem List Items Addressed This Visit      Cardiovascular and Mediastinum   Essential hypertension    Controlled hypertension.  BP is at goal < 130/80.  Pt is working on lifestyle modifications.  Taking medications tolerating well without side effects.  Complications:  Obesity, T2DM, HLD  Plan: 1. Continue taking triamterene-HCTZ 37.5-25mg  daily 2. Obtain labs at next visit  3. Encouraged heart healthy diet and increasing exercise to 30 minutes most days of the week, going no more than 2 days in a row without exercise. 4. Check BP 1-2 x per week at home, keep log, and bring to clinic at next appointment. 5. Follow up 6 months.       Relevant Medications   triamterene-hydrochlorothiazide (MAXZIDE-25) 37.5-25 MG tablet     Endocrine   Controlled type 2 diabetes mellitus with hyperglycemia ([place]) - Primary    ControlledDM with A1c 6.7% improved from 7.2% on 05/25/2020 and goal A1c <  7.0%. - Complications - peripheral neuropathy, obesity, HTN, HLD  Plan:  1. Continue current therapy: metformin 1000mg  AM and 500mg  PM WC 2. Encourage improved lifestyle: - low carb/low glycemic diet handout provided - Increase physical activity to 30 minutes most days of the week.  Explained that increased physical activity increases body's use of sugar for energy. 3. Check fasting am CBG and log these.  Bring log to next visit for review  4. Advised to schedule DM ophtho exam, send record. 5. Follow-up 6 months      Relevant Medications   metFORMIN (GLUCOPHAGE) 500 MG tablet   Other Relevant Orders   POCT glycosylated hemoglobin (Hb A1C) (Completed)     Nervous and Auditory   Neuropathy due to chemotherapeutic drug ([facility])    Stable and controlled with current dosage of gabapentin 400mg  at bedtime.  Will continue.      Relevant Medications   gabapentin (NEURONTIN) 400 MG capsule     Other   Hypokalemia    Stable with current dosage of potassium chloride 10 mEq BID.  Will continue.      Relevant Medications   potassium chloride (KLOR-CON) 10 MEQ tablet      Meds ordered this encounter  Medications  . gabapentin (NEURONTIN) 400 MG capsule    Sig: Take 1 capsule (400 mg total) by mouth at bedtime.    Dispense:  90 capsule    Refill:  1  . metFORMIN (GLUCOPHAGE) 500 MG tablet    Sig: TAKE 2 TABLETS BY MOUTH ONCE DAILY WITH BREAKFAST AND 1 WITH SUPPER    Dispense:  270 tablet    Refill:  1  . potassium chloride (KLOR-CON) 10 MEQ tablet    Sig: Take 1 tablet (10 mEq total) by mouth 2 (two) times daily.    Dispense:  180 tablet    Refill:  1  . triamterene-hydrochlorothiazide (MAXZIDE-25) 37.5-25 MG tablet    Sig: Take 1 tablet by mouth daily.    Dispense:  90 tablet    Refill:  1    Follow up plan: Return in about 6 months (around 04/17/2021) for HTN & DM F/U.   Harlin Rain, [place] [facility] Nurse Practitioner [facility] 10/18/2020, 4:35 PM

## 2020-10-18 NOTE — Assessment & Plan Note (Signed)
Stable and controlled with current dosage of gabapentin 400mg  at bedtime.  Will continue.

## 2020-10-18 NOTE — Patient Instructions (Signed)
Your provider would like to you have your annual eye exam. Please contact your current eye doctor or here are some good options for you to contact.   [facility]   Address: [address] [place], [place] [address] Phone: [phone]  Website: [email or web address] [address], [place], [place] [address] Phone: [phone] [email or web address]  [facility]  Address: [place], [place], [place] [address] Phone: [phone]   [facility] [address] [place], [place] [address] Phone: [phone]  You can learn more information online about your diabetes at American Diabetes Association: http://www.diabetes.org/ - General self-care (diet, medications, blood sugar checks). - Diet recommendations - There are even recipes available for you to look at and try.  Try to get exercise a minimum of 30 minutes per day at least 5 days per week as well as  adequate water intake all while measuring blood pressure a few times per week.  Keep a blood pressure log and bring back to clinic at your next visit.  If your readings are consistently over 130/80 to contact our office/send me a MyChart message and we will see you sooner.  Can try DASH and Mediterranean diet options, avoiding processed foods, lowering sodium intake, avoiding pork products, and eating a plant based diet for optimal health.  We will plan to see you back in 6 months for hypertension & diabetes  You will receive a survey after today's visit either digitally by e-mail or paper by USPS mail. Your experiences and feedback matter to [place].  Please respond so we know how we are doing as we provide care for you.  Call us with any questions/concerns/needs.  It is my goal to be available to you for your health concerns.  Thanks for choosing me to be a partner in your healthcare needs!  Harlin Rain, FNP-C Family Nurse Practitioner [facility] Phone: [phone]

## 2020-10-18 NOTE — Assessment & Plan Note (Signed)
Stable with current dosage of potassium chloride 10 mEq BID.  Will continue.

## 2020-10-18 NOTE — Assessment & Plan Note (Signed)
ControlledDM with A1c 6.7% improved from 7.2% on 05/25/2020 and goal A1c < 7.0%. - Complications - peripheral neuropathy, obesity, HTN, HLD  Plan:  1. Continue current therapy: metformin 1000mg  AM and 500mg  PM WC 2. Encourage improved lifestyle: - low carb/low glycemic diet handout provided - Increase physical activity to 30 minutes most days of the week.  Explained that increased physical activity increases body's use of sugar for energy. 3. Check fasting am CBG and log these.  Bring log to next visit for review 4. Advised to schedule DM ophtho exam, send record. 5. Follow-up 6 months

## 2020-10-18 NOTE — Assessment & Plan Note (Signed)
Controlled hypertension.  BP is at goal < 130/80.  Pt is working on lifestyle modifications.  Taking medications tolerating well without side effects.  Complications:  Obesity, T2DM, HLD  Plan: 1. Continue taking triamterene-HCTZ 37.5-25mg  daily 2. Obtain labs at next visit  3. Encouraged heart healthy diet and increasing exercise to 30 minutes most days of the week, going no more than 2 days in a row without exercise. 4. Check BP 1-2 x per week at home, keep log, and bring to clinic at next appointment. 5. Follow up 6 months.

## 2020-10-19 ENCOUNTER — Ambulatory Visit: Payer: Medicaid Other | Admitting: Physical Therapy

## 2020-10-19 ENCOUNTER — Other Ambulatory Visit: Payer: Self-pay | Admitting: Hematology and Oncology

## 2020-10-27 NOTE — Progress Notes (Signed)
[facility]  [address], [address] [place], [place] [address] Phone: [phone]  Fax: [phone]   Clinic Day:  10/28/2020  Referring physician: Verl Bangs, FNP   Chief Complaint: Chelsea Hudson is a 54 y.o. female with stage IIIC right breast cancer who is seen for 8 month assessment.   HPI: The patient was last seen in the medical oncology clinic on 02/12/2020 as a new patient.  At that time,  she felt "ok".  She had lymphedema.  She denied any further rectal bleeding. Hematocrit was 36.7, hemoglobin 11.8, platelets 378,000, WBC 10,100. Sodium was 134. Potassium was 3.0. CA27.29 was 16.8. Discussed continued surveillance.  Screening left mammogram on 10/18/2020 revealed no evidence of malignancy.  During the interim, she has been doing well. Her lymphedema in her arm is good; she is using a sleeve. Her rectal bleeding has resolved. 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 She still has tingling in her fingertips (R>L) and if she forgets to take a pill, she cannot sleep. Her hot flashes and carpal tunnel are stable.  The patient performs monthly breast exams and has no concerns. She is still taking Femara and needs a refill. She needs to schedule a bone density.   She takes oral potassium 10 mEq BID.   Past Medical History:  Diagnosis Date  . Arthritis    SHOULDER  . Breast cancer ([place]) 02/2017   rt breast  . Breast cancer ([place]) 2018  . Cancer ([place]) 02/28/2017   INVASIVE MAMMARY CARCINOMA WITH MUCINOUS FEATURES.  . Colonic diverticular abscess 06/21/2017   Colonoscopy 06/25/2017: No evidence of malignancy.  . Diabetes mellitus without complication ([place])   . Hypertension   . Irregular heart beat    PT STATES IT "SKIPS A BEAT"   . Obesity   . Personal history of chemotherapy   . Personal history of radiation therapy     Past Surgical History:  Procedure Laterality Date  . ANKLE SURGERY    . BREAST  BIOPSY Right 02/28/2017   INVASIVE MAMMARY CARCINOMA WITH MUCINOUS FEATURES.  Marland Kitchen BREAST CYST ASPIRATION Right    NEG  . COLONOSCOPY WITH PROPOFOL N/A 06/25/2017   Procedure: COLONOSCOPY WITH PROPOFOL;  Surgeon: Jonathon Bellows, MD;  Location[facility] ENDOSCOPY;  Service: Gastroenterology;  Laterality: N/A;  . MASTECTOMY, PARTIAL Right 07/30/2017   Procedure: MASTECTOMY PARTIAL;  Surgeon: Robert Bellow, MD;  Location: [facility] ORS;  Service: General;  Laterality: Right;  . PORTACATH PLACEMENT Left 03/15/2017   Procedure: INSERTION PORT-A-CATH;  Surgeon: Robert Bellow, MD;  Location: [facility] ORS;  Service: General;  Laterality: Left;  . RE-EXCISION OF BREAST LUMPECTOMY Right 08/17/2017    INVASIVE CARCINOMA EXTENDS TO NEW LATERAL MARGIN. /RE-EXCISION OF BREAST LUMPECTOMY;: Byrnett, Forest Gleason, MD;  [facility] ORS; General;  Laterality: Right;  . SENTINEL NODE BIOPSY Right 07/30/2017   Procedure: SENTINEL NODE BIOPSY;  Surgeon: Robert Bellow, MD;  Location: [facility] ORS;  Service: General;  Laterality: Right;  . SIMPLE MASTECTOMY WITH AXILLARY SENTINEL NODE BIOPSY Right 09/28/2017   Procedure: SIMPLE MASTECTOMY;  Surgeon: Robert Bellow, MD;  Location: [facility] ORS;  Service: General;  Laterality: Right;    Family History  Problem Relation Age of Onset  . Diabetes Father   . Stroke Father   . Hypertension Father   . Hypertension Mother   . Brain cancer Maternal Aunt 60  . Diabetes Sister   . Colon cancer Neg Hx   .  Breast cancer Neg Hx     Social History:  reports that she quit smoking about 3 years ago. Her smoking use included cigarettes. She has a 6.25 pack-year smoking history. She has quit using smokeless tobacco. She reports previous alcohol use. She reports that she does not use drugs. She is a Administrator. She denies any exposure to any toxins and radiations. The patient is alone today.  Allergies:  Allergies  Allergen Reactions  . Other Hives and Itching    Patient states that she's allergic  to an antibiotic but not sure which one. It was given to her for infection     Current Medications: Current Outpatient Medications  Medication Sig Dispense Refill  . gabapentin (NEURONTIN) 400 MG capsule Take 1 capsule (400 mg total) by mouth at bedtime. 90 capsule 1  . letrozole (FEMARA) 2.5 MG tablet Take 1 tablet by mouth once daily 90 tablet 0  . metFORMIN (GLUCOPHAGE) 500 MG tablet TAKE 2 TABLETS BY MOUTH ONCE DAILY WITH BREAKFAST AND 1 WITH SUPPER 270 tablet 1  . phentermine 37.5 MG capsule Take 1 capsule (37.5 mg total) by mouth every morning. 30 capsule 0  . potassium chloride (KLOR-CON) 10 MEQ tablet Take 1 tablet (10 mEq total) by mouth 2 (two) times daily. 180 tablet 1  . triamterene-hydrochlorothiazide (MAXZIDE-25) 37.5-25 MG tablet Take 1 tablet by mouth daily. 90 tablet 1   No current facility-administered medications for this visit.    Review of Systems  Constitutional: Negative for chills, fever, malaise/fatigue and weight loss (up 1 lb).       Feels "good."  HENT: Negative for congestion, ear discharge, ear pain, hearing loss, nosebleeds, sinus pain, sore throat and tinnitus.   Eyes: Negative for blurred vision and double vision.  Respiratory: Negative for cough, hemoptysis, sputum production and shortness of breath.   Cardiovascular: Negative for chest pain, palpitations and leg swelling.  Gastrointestinal: Negative for abdominal pain, blood in stool, constipation, diarrhea, heartburn, melena, nausea and vomiting.  Genitourinary: Negative for dysuria, frequency, hematuria and urgency.  Musculoskeletal: Positive for joint pain (arthritis in shoulders and knees). Negative for back pain, myalgias and neck pain.       Lymphedema, wears a compression sleeve.  Skin: Negative for itching and rash.  Neurological: Positive for tingling (in fingertips, R>L). Negative for dizziness, sensory change, weakness and headaches.       Carpal tunnel.  Endo/Heme/Allergies: Does not  bruise/bleed easily.       Hot flashes.  Psychiatric/Behavioral: Negative for depression and memory loss. The patient has insomnia. The patient is not nervous/anxious.   All other systems reviewed and are negative.  Performance status (ECOG): 1  Vitals Blood pressure 139/83, pulse 98, temperature 97.8 F (36.6 C), resp. rate 20, weight 244 lb 13.1 oz (111 kg), last menstrual period 01/31/2014, SpO2 99 %.   Physical Exam Vitals and nursing note reviewed.  Constitutional:      General: She is not in acute distress.    Appearance: She is well-developed. She is not diaphoretic.  HENT:     Head: Normocephalic and atraumatic.     Mouth/Throat:     Pharynx: No oropharyngeal exudate.     Comments: Partial metal plate. Eyes:     General: No scleral icterus.    Conjunctiva/sclera: Conjunctivae normal.     Pupils: Pupils are equal, round, and reactive to light.     Comments: Brown eyes.  Neck:     Vascular: No JVD.  Cardiovascular:  Rate and Rhythm: Normal rate and regular rhythm.     Heart sounds: Normal heart sounds. No murmur heard.   Pulmonary:     Effort: Pulmonary effort is normal. No respiratory distress.     Breath sounds: Normal breath sounds. No wheezing or rales.  Chest:     Chest wall: No tenderness.  Breasts:     Right: Skin change (scarring from radiation) present. No swelling, bleeding, tenderness or supraclavicular adenopathy.     Left: No swelling, bleeding, mass, skin change, tenderness or supraclavicular adenopathy.    Abdominal:     General: Bowel sounds are normal. There is no distension.     Palpations: Abdomen is soft. There is no mass.     Tenderness: There is no abdominal tenderness. There is no guarding or rebound.  Musculoskeletal:        General: No tenderness. Normal range of motion.     Cervical back: Normal range of motion and neck supple.     Right lower leg: Edema (2+, chronic) present.     Left lower leg: Edema (2+, chronic) present.   Lymphadenopathy:     Head:     Right side of head: No preauricular, posterior auricular or occipital adenopathy.     Left side of head: No preauricular, posterior auricular or occipital adenopathy.     Cervical: No cervical adenopathy.     Upper Body:     Right upper body: No supraclavicular adenopathy.     Left upper body: No supraclavicular adenopathy.     Lower Body: No right inguinal adenopathy. No left inguinal adenopathy.  Skin:    General: Skin is warm and dry.     Findings: No erythema or rash.  Neurological:     Mental Status: She is alert and oriented to person, place, and time.  Psychiatric:        Behavior: Behavior normal.        Thought Content: Thought content normal.        Judgment: Judgment normal.    Appointment on 10/28/2020  Component Date Value Ref Range Status  . WBC 10/28/2020 10.0  4.0 - 10.5 K/uL Final  . RBC 10/28/2020 4.53  3.87 - 5.11 MIL/uL Final  . Hemoglobin 10/28/2020 11.9* 12.0 - 15.0 g/dL Final  . HCT 10/28/2020 36.9  36.0 - 46.0 % Final  . MCV 10/28/2020 81.5  80.0 - 100.0 fL Final  . MCH 10/28/2020 26.3  26.0 - 34.0 pg Final  . MCHC 10/28/2020 32.2  30.0 - 36.0 g/dL Final  . RDW 10/28/2020 17.9* 11.5 - 15.5 % Final  . Platelets 10/28/2020 367  150 - 400 K/uL Final  . nRBC 10/28/2020 0.0  0.0 - 0.2 % Final  . Neutrophils Relative % 10/28/2020 80  % Final  . Neutro Abs 10/28/2020 8.0* 1.7 - 7.7 K/uL Final  . Lymphocytes Relative 10/28/2020 16  % Final  . Lymphs Abs 10/28/2020 1.6  0.7 - 4.0 K/uL Final  . Monocytes Relative 10/28/2020 3  % Final  . Monocytes Absolute 10/28/2020 0.3  0.1 - 1.0 K/uL Final  . Eosinophils Relative 10/28/2020 1  % Final  . Eosinophils Absolute 10/28/2020 0.1  0.0 - 0.5 K/uL Final  . Basophils Relative 10/28/2020 0  % Final  . Basophils Absolute 10/28/2020 0.0  0.0 - 0.1 K/uL Final  . Immature Granulocytes 10/28/2020 0  % Final  . Abs Immature Granulocytes 10/28/2020 0.02  0.00 - 0.07 K/uL Final   Performed at  [facility], [address], [place], [place] [address]    Assessment:  MANVI GUILLIAMS is a 54 y.o. 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  Biopsy on 02/28/2017 revealed invasive carcinoma with mucinous features.  Lymph node biopsy revealed metastatic disease.  Tumor was ER positive (90%), PR positive (90%) and HER-2/neu negative.  She had clinical stage [id number] breast cancer.  PET scan on 03/09/2017 revealed hypermetabolic right axillary/subpectoral adenopathy.  There was low-grade activity in the right upper breast likely at the postoperative site.  Appearance was compatible with metastatic spread to right axillary/subpectoral lymph nodes.  There was some sigmoid colon diverticulosis, with faint inflammatory findings adjacent to the sigmoid colon proximally, and with accentuated activity in the involved segment of the sigmoid colon.   Partial mastectomy and sentinel lymph node biopsy on 07/30/2017 revealed a 1.8 cm invasive mammary carcinoma with mucinous features.  There was lymphovascular invasion.  Eight of 12 lymph nodes were positive for metastatic disease.   She required multiple excisions to obtain clear margins resulting in a full mastectomy on 09/28/2017.  She underwent complete axillary node dissection on 03/04/2018.  Level 2 and 3 lymph node dissection revealed 3 of 3 lymph nodes positive for carcinoma.  The largest tumor deposit was at least 20 mm.  Extracapsular extension was present < 1 mm beyond the lymph node capsule.   Mammogram and ultrasound on 02/09/2017 revealed an irregular 2.1 x 1.0 x 0.9 cm hypoechoic mass with a feeding vessel 11:00, 8 cm from the nipple.  There was overlying skin thickness.  Ultrasound of the right axilla revealed at least 3 bulky lymph nodes deep to the pectoralis (level 2 lymphadenopathy).  Lymph node measured 1.6 x 1.4 x 1.9 cm and 2 x 1.7  x 1.8 cm.  She received 4 cycles of AC (03/22/2017 - 05/02/2017) and 7 of 12 cycles of neoadjuvant Taxol (05/16/2017 - 06/27/2017).  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  She received adjuvant radiation at [facility].  She began Femara in 05/2018.  Left mammogram on 10/09/2018 revealed no suspicious masses, asymmetries, or malignant calcifications.  Left screening mammogram on 10/13/2019 showed no evidence of malignancy.    CA27.29 has been followed: 21.1 on 06/12/2019, 18.4 on 10/15/2019, 16.8 on 02/12/2020, and 18.2 on 10/28/2020.  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  Bone density on 07/17/2018 was normal with a T-score of -0.3 at the AP spine L1-L4.  She is not interested in the COVID-19 vaccine.   Symptomatically, she has been doing well. Her lymphedema is well managed; she is using a sleeve. Her rectal bleeding has resolved. She still has tingling in her fingertips (R>L).  Hot flashes and carpal tunnel are stable.  She performs monthly breast exams and has no concerns. She is taking Femara. Exam is stable.  Plan: 1.   Labs today: CBC with diff, CMP, CA27.29 2.   Stage IIIC right breast cancer             She received neoadjuvant chemotherapy (4 cycles of AC and 7 weeks of Taxol).             She underwent completion mastectomy and radiation.             She began Femara on 05/2018.             Clinically, she is doing  well.  Exam reveals no evidence of recurrent disease.             Left screening mammogram on 10/13/2019 revealed no evidence of malignancy.  Mammogram on 10/18/2020.  Patient is on Femara (began 05/2018).     Refill Femara 2.5 mg p.o. daily (dispense #90 with 3 refills).    Continue surveillance. 3.   Chemotherapy neuropathy  She has severe right carpal tunnel syndrome.             She has been referred for carpal release syndrome.  Continue to monitor 4.   Hypokalemia  Potassium  3.4.    Patient is on potassium 10 mEq p.o. twice daily.  Continue to monitor. 5.   Health maintenance  Bone density on 07/17/2018 was normal.  Discuss need for bone density every 2 years on an aromatase inhibitor.  Bone density on 11/02/2020. 6.   Port-a-cath  Port flush today and every 12 weeks. 7.   Schedule mammogram on 10/18/2021. 8.   RTC in 6 months for MD assessment, labs (CBC with diff, CMP, CA27.29), and review of bone density.   I discussed the assessment and treatment plan with the patient.  The patient was provided an opportunity to ask questions and all were answered.  The patient agreed with the plan and demonstrated an understanding of the instructions.  The patient was advised to call back if the symptoms worsen or if the condition fails to improve as anticipated.  I provided 20 minutes of face-to-face time during this this encounter and > 50% was spent counseling as documented under my assessment and plan.  An additional 10 minutes were spent reviewing her chart (Epic and Care Everywhere) including notes, labs, and imaging studies.    Lequita Asal, MD, PhD    10/28/2020, 3:27 PM  I, Mirian Mo Tufford, am acting as Education administrator for [employer]. Mike Gip, MD, PhD.  I, Deeya Richeson C. Mike Gip, MD, have reviewed the above documentation for accuracy and completeness, and I agree with the above.

## 2020-10-28 ENCOUNTER — Inpatient Hospital Stay: Payer: Medicaid Other | Attending: Hematology and Oncology | Admitting: Hematology and Oncology

## 2020-10-28 ENCOUNTER — Encounter: Payer: Self-pay | Admitting: Hematology and Oncology

## 2020-10-28 ENCOUNTER — Inpatient Hospital Stay: Payer: Medicaid Other

## 2020-10-28 ENCOUNTER — Other Ambulatory Visit: Payer: Self-pay

## 2020-10-28 VITALS — BP 139/83 | HR 98 | Temp 97.8°F | Resp 20 | Wt 244.8 lb

## 2020-10-28 DIAGNOSIS — Z923 Personal history of irradiation: Secondary | ICD-10-CM | POA: Diagnosis not present

## 2020-10-28 DIAGNOSIS — C50411 Malignant neoplasm of upper-outer quadrant of right female breast: Secondary | ICD-10-CM

## 2020-10-28 DIAGNOSIS — Z0000 Encounter for general adult medical examination without abnormal findings: Secondary | ICD-10-CM

## 2020-10-28 DIAGNOSIS — Z95828 Presence of other vascular implants and grafts: Secondary | ICD-10-CM

## 2020-10-28 DIAGNOSIS — Z9221 Personal history of antineoplastic chemotherapy: Secondary | ICD-10-CM | POA: Insufficient documentation

## 2020-10-28 DIAGNOSIS — G620 Drug-induced polyneuropathy: Secondary | ICD-10-CM

## 2020-10-28 DIAGNOSIS — Z87891 Personal history of nicotine dependence: Secondary | ICD-10-CM | POA: Insufficient documentation

## 2020-10-28 DIAGNOSIS — E876 Hypokalemia: Secondary | ICD-10-CM

## 2020-10-28 DIAGNOSIS — T451X5A Adverse effect of antineoplastic and immunosuppressive drugs, initial encounter: Secondary | ICD-10-CM | POA: Insufficient documentation

## 2020-10-28 DIAGNOSIS — Z170 Estrogen receptor positive status [ER+]: Secondary | ICD-10-CM | POA: Diagnosis not present

## 2020-10-28 LAB — COMPREHENSIVE METABOLIC PANEL
ALT: 25 U/L (ref 0–44)
AST: 24 U/L (ref 15–41)
Albumin: 3.7 g/dL (ref 3.5–5.0)
Alkaline Phosphatase: 78 U/L (ref 38–126)
Anion gap: 13 (ref 5–15)
BUN: 17 mg/dL (ref 6–20)
CO2: 27 mmol/L (ref 22–32)
Calcium: 9.1 mg/dL (ref 8.9–10.3)
Chloride: 96 mmol/L — ABNORMAL LOW (ref 98–111)
Creatinine, Ser: 0.96 mg/dL (ref 0.44–1.00)
GFR, Estimated: >60 mL/min (ref >60)
Glucose, Bld: 125 mg/dL — ABNORMAL HIGH (ref 70–99)
Potassium: 3.4 mmol/L — ABNORMAL LOW (ref 3.5–5.1)
Sodium: 136 mmol/L (ref 135–145)
Total Bilirubin: 0.4 mg/dL (ref 0.3–1.2)
Total Protein: 7.6 g/dL (ref 6.5–8.1)

## 2020-10-28 LAB — CBC WITH DIFFERENTIAL/PLATELET
Abs Immature Granulocytes: 0.02 10*3/uL (ref 0.00–0.07)
Basophils Absolute: 0 10*3/uL (ref 0.0–0.1)
Basophils Relative: 0 %
Eosinophils Absolute: 0.1 10*3/uL (ref 0.0–0.5)
Eosinophils Relative: 1 %
HCT: 36.9 % (ref 36.0–46.0)
Hemoglobin: 11.9 g/dL — ABNORMAL LOW (ref 12.0–15.0)
Immature Granulocytes: 0 %
Lymphocytes Relative: 16 %
Lymphs Abs: 1.6 10*3/uL (ref 0.7–4.0)
MCH: 26.3 pg (ref 26.0–34.0)
MCHC: 32.2 g/dL (ref 30.0–36.0)
MCV: 81.5 fL (ref 80.0–100.0)
Monocytes Absolute: 0.3 10*3/uL (ref 0.1–1.0)
Monocytes Relative: 3 %
Neutro Abs: 8 10*3/uL — ABNORMAL HIGH (ref 1.7–7.7)
Neutrophils Relative %: 80 %
Platelets: 367 10*3/uL (ref 150–400)
RBC: 4.53 MIL/uL (ref 3.87–5.11)
RDW: 17.9 % — ABNORMAL HIGH (ref 11.5–15.5)
WBC: 10 10*3/uL (ref 4.0–10.5)
nRBC: 0 % (ref 0.0–0.2)

## 2020-10-28 MED ORDER — LETROZOLE 2.5 MG PO TABS: 2.5000 mg | ORAL_TABLET | Freq: Every day | ORAL | 3 refills | Status: DC

## 2020-10-28 NOTE — Progress Notes (Incomplete)
[facility]  [address] [place], [place] [address] Phone: [phone]  Fax: [phone]   Telemedicine Office Visit:  10/27/2020  Referring physician: Verl Bangs, FNP  I connected with Chelsea Hudson on $Remove'@[email or web address]$ @ at 3:31 PM by videoconferencing and verified that I was speaking with the correct person using 2 identifiers.  The patient was at *** home.  I discussed the limitations, risk, security and privacy concerns of performing an evaluation and management service by videoconferencing and the availability of in person appointments.  I also discussed with the patient that there may be a patient responsible charge related to this service.  The patient expressed understanding and agreed to proceed.   Chief Complaint: Chelsea Hudson is a 54 y.o. female with stage IIIC right breast cancer who is seen for 8 month assessment.   HPI: The patient was last seen in the medical oncology clinic on 02/12/2020 as a new patient.  At that time,  she felt "ok".  She had lymphedema.  She denied any further rectal bleeding. Hematocrit was 36.7, hemoglobin 11.8, platelets 378,000, WBC 10,100. Sodium was 134. Potassium was 3.0. CA27.29 was 16.8. We discussed continued surveillance.  Left screening mammogram on 10/18/2020 revealed no evidence of malignancy.  The patient needs a refill of her Femara.  During the interim, ***   Past Medical History:  Diagnosis Date  . Arthritis    SHOULDER  . Breast cancer ([place]) 02/2017   rt breast  . Breast cancer ([place]) 2018  . Cancer ([place]) 02/28/2017   INVASIVE MAMMARY CARCINOMA WITH MUCINOUS FEATURES.  . Colonic diverticular abscess 06/21/2017   Colonoscopy 06/25/2017: No evidence of malignancy.  . Diabetes mellitus without complication ([place])   . Hypertension   . Irregular heart beat    PT STATES IT "SKIPS A BEAT"   . Obesity   . Personal history of chemotherapy   . Personal history of radiation therapy     Past  Surgical History:  Procedure Laterality Date  . ANKLE SURGERY    . BREAST BIOPSY Right 02/28/2017   INVASIVE MAMMARY CARCINOMA WITH MUCINOUS FEATURES.  Marland Kitchen BREAST CYST ASPIRATION Right    NEG  . COLONOSCOPY WITH PROPOFOL N/A 06/25/2017   Procedure: COLONOSCOPY WITH PROPOFOL;  Surgeon: Jonathon Bellows, MD;  Location: [facility] ENDOSCOPY;  Service: Gastroenterology;  Laterality: N/A;  . MASTECTOMY, PARTIAL Right 07/30/2017   Procedure: MASTECTOMY PARTIAL;  Surgeon: Robert Bellow, MD;  Location: [facility] ORS;  Service: General;  Laterality: Right;  . PORTACATH PLACEMENT Left 03/15/2017   Procedure: INSERTION PORT-A-CATH;  Surgeon: Robert Bellow, MD;  Location: [facility] ORS;  Service: General;  Laterality: Left;  . RE-EXCISION OF BREAST LUMPECTOMY Right 08/17/2017    INVASIVE CARCINOMA EXTENDS TO NEW LATERAL MARGIN. /RE-EXCISION OF BREAST LUMPECTOMY;: Byrnett, Forest Gleason, MD;  [facility] ORS; General;  Laterality: Right;  . SENTINEL NODE BIOPSY Right 07/30/2017   Procedure: SENTINEL NODE BIOPSY;  Surgeon: Robert Bellow, MD;  Location: [facility] ORS;  Service: General;  Laterality: Right;  . SIMPLE MASTECTOMY WITH AXILLARY SENTINEL NODE BIOPSY Right 09/28/2017   Procedure: SIMPLE MASTECTOMY;  Surgeon: Robert Bellow, MD;  Location: [facility] ORS;  Service: General;  Laterality: Right;    Family History  Problem Relation Age of Onset  . Diabetes Father   . Stroke Father   . Hypertension Father   . Hypertension Mother   . Brain cancer Maternal Aunt 60  . Diabetes Sister   . Colon cancer Neg  Hx   . Breast cancer Neg Hx     Social History:  reports that she quit smoking about 3 years ago. Her smoking use included cigarettes. She has a 6.25 pack-year smoking history. She has quit using smokeless tobacco. She reports previous alcohol use. She reports that she does not use drugs. She is a Administrator. She denies any exposure to any toxins and radiations. The patient is alone*** today.  Participants in the patient's  visit and their role in the encounter included the patient, ***, and Waymon Budge, RN or Vito Berger, CMA, today.  The intake visit was provided by *** Waymon Budge, RN or Vito Berger, [place].  Allergies:  Allergies  Allergen Reactions  . Other Hives and Itching    [name]     Current Medications: Current Outpatient Medications  Medication Sig Dispense Refill  . gabapentin (NEURONTIN) 400 MG capsule Take 1 capsule (400 mg total) by mouth at bedtime. 90 capsule 1  . letrozole (FEMARA) 2.5 MG tablet Take 1 tablet by mouth once daily 90 tablet 0  . metFORMIN (GLUCOPHAGE) 500 MG tablet TAKE 2 TABLETS BY MOUTH ONCE DAILY WITH BREAKFAST AND 1 WITH SUPPER 270 tablet 1  . phentermine 37.5 MG capsule Take 1 capsule (37.5 mg total) by mouth every morning. 30 capsule 0  . potassium chloride (KLOR-CON) 10 MEQ tablet Take 1 tablet (10 mEq total) by mouth 2 (two) times daily. 180 tablet 1  . triamterene-hydrochlorothiazide (MAXZIDE-25) 37.5-25 MG tablet Take 1 tablet by mouth daily. 90 tablet 1   No current facility-administered medications for this visit.    Review of Systems  Constitutional: Positive for weight loss (3 pounds). Negative for chills, fever and malaise/fatigue.       Doing ok.  HENT: Negative for congestion, ear pain, hearing loss, sore throat and tinnitus.   Eyes: Negative for blurred vision and double vision.  Respiratory: Negative for cough, hemoptysis, sputum production and shortness of breath.   Cardiovascular: Negative for chest pain, palpitations and leg swelling.  Gastrointestinal: Negative for abdominal pain, blood in stool, constipation, diarrhea, melena, nausea and vomiting.  Genitourinary: Negative for dysuria, frequency, hematuria and urgency.       Last menstrual cycle was 5 years ago.   Musculoskeletal: Positive for joint pain (arthritis in  shoulders and knees) and neck pain (left sided). Negative for back pain and myalgias.       Lymphedema. Compression sleeve.  Skin: Negative for rash.  Neurological: Positive for tingling (left arm). Negative for dizziness, sensory change, weakness and headaches.       Neuropathy not improving.  Carpal tunnel.  Endo/Heme/Allergies: Does not bruise/bleed easily.       Hot flashes.  Psychiatric/Behavioral: Negative for depression and memory loss. The patient has insomnia (on allergy pill with Benadryl). The patient is not nervous/anxious.   All other systems reviewed and are negative.  Performance status (ECOG): 0-1***  Vitals Last menstrual period 01/31/2014.   Physical Exam Vitals and nursing note reviewed.  Constitutional:      General: She is not in acute distress.    Appearance: She is well-developed. She is not diaphoretic.  HENT:     Head: Normocephalic and atraumatic.     Mouth/Throat:     Pharynx: No oropharyngeal exudate.  Eyes:     General: No scleral icterus.    Conjunctiva/sclera: Conjunctivae normal.  Pupils: Pupils are equal, round, and reactive to light.     Comments: Brown eyes.  Neck:     Vascular: No JVD.  Cardiovascular:     Rate and Rhythm: Normal rate and regular rhythm.     Heart sounds: Normal heart sounds. No murmur heard.   Pulmonary:     Effort: Pulmonary effort is normal. No respiratory distress.     Breath sounds: Normal breath sounds. No wheezing or rales.  Chest:     Chest wall: No tenderness.     Breasts:        Right: No inverted nipple, mass, nipple discharge, skin change or tenderness.        Left: No inverted nipple, mass, nipple discharge, skin change or tenderness.  Abdominal:     General: Bowel sounds are normal. There is no distension.     Palpations: Abdomen is soft. There is no mass.     Tenderness: There is no abdominal tenderness. There is no guarding or rebound.  Musculoskeletal:        General: No tenderness. Normal range  of motion.     Cervical back: Normal range of motion and neck supple.  Lymphadenopathy:     Head:     Right side of head: No preauricular, posterior auricular or occipital adenopathy.     Left side of head: No preauricular, posterior auricular or occipital adenopathy.     Cervical: No cervical adenopathy.     Upper Body:     Right upper body: No supraclavicular adenopathy.     Left upper body: No supraclavicular adenopathy.     Lower Body: No right inguinal adenopathy. No left inguinal adenopathy.     Comments: Edge of thyroid palpable.  Skin:    General: Skin is warm and dry.     Findings: No erythema or rash.  Neurological:     Mental Status: She is alert and oriented to person, place, and time.  Psychiatric:        Behavior: Behavior normal.        Thought Content: Thought content normal.        Judgment: Judgment normal.    No visits with results within 3 Day(s) from this visit.  Latest known visit with results is:  Office Visit on 10/18/2020  Component Date Value Ref Range Status  . Hemoglobin A1C 10/18/2020 6.7* 4.0 - 5.6 % Final    Assessment:  Chelsea Hudson is a 54 y.o. 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  Biopsy on 02/28/2017 revealed invasive carcinoma with mucinous features.  Lymph node biopsy revealed metastatic disease.  Tumor was ER positive (90%), PR positive (90%) and HER-2/neu negative.  She had clinical stage T3N3aM0 breast cancer.  PET scan on 03/09/2017 revealed hypermetabolic right axillary/subpectoral adenopathy.  There was low-grade activity in the right upper breast likely at the postoperative site.  Appearance was compatible with metastatic spread to right axillary/subpectoral lymph nodes.  There was some sigmoid colon diverticulosis, with faint inflammatory findings adjacent to the sigmoid colon proximally, and with accentuated activity in the  involved segment of the sigmoid colon.   Partial mastectomy and sentinel lymph node biopsy on 07/30/2017 revealed a 1.8 cm invasive mammary carcinoma with mucinous features.  There was lymphovascular invasion.  Eight of 12 lymph nodes were positive for metastatic disease.   She required multiple excisions to obtain clear margins resulting in a full  mastectomy on 09/28/2017.  She underwent complete axillary node dissection on 03/04/2018.  Level 2 and 3 lymph node dissection revealed 3 of 3 lymph nodes positive for carcinoma.  The largest tumor deposit was at least 20 mm.  Extracapsular extension was present < 1 mm beyond the lymph node capsule.   Mammogram and ultrasound on 02/09/2017 revealed an irregular 2.1 x 1.0 x 0.9 cm hypoechoic mass with a feeding vessel 11:00, 8 cm from the nipple.  There was overlying skin thickness.  Ultrasound of the right axilla revealed at least 3 bulky lymph nodes deep to the pectoralis (level 2 lymphadenopathy).  Lymph node measured 1.6 x 1.4 x 1.9 cm and 2 x 1.7 x 1.8 cm.  She received 4 cycles of AC (03/22/2017 - 05/02/2017) and 7 of 12 cycles of neoadjuvant Taxol (05/16/2017 - 06/27/2017).  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  She received adjuvant radiation at [facility].  She began Femara in 05/2018.  Left diagnostic mammogram on 10/09/2018 revealed no suspicious masses, asymmetries, or malignant calcifications.  Left screening mammogram on 10/13/2019 showed no evidence of malignancy.  Left screening mammogram on 10/18/2020 revealed no evidence of malignancy.  CA27.29 has been followed: 21.1 on 06/12/2019, 18.4 on 10/15/2019, and 16.8 on 02/12/2020.  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  Bone density on 07/17/2018 was normal with a T-score of -0.3 at the AP spine L1-L4.  She is not interested in the COVID-19 vaccine.   Symptomatically,***  Plan: 1.   Labs today: CBC with  diff, CMP, CA27.29   2.   Stage IIIC right breast cancer             She received neoadjuvant chemotherapy (4 cycles of AC and 7 weeks of Taxol).             She underwent completion mastectomy and radiation.             She began Femara on 05/2018.             Clinically, she continues to do well without recurrent disease.             Left unilateral mammogram on 10/13/2019 showed no evidence of malignancy.  Continue surveillance. 3.   Chemotherapy neuropathy             Review interval neurology note (02/10/2020).  She has severe right carpal tunnel syndrome.             She has been referred for carpal release syndrome. 4.   Hypokalemia  Rx: potassium 20 meq q day x 3 days.  RN to contact PCP re: hypokalemia. 5.   RTC in 6 months for MD assessment and labs (CBC with diff, CMP, CA27.29).   I discussed the assessment and treatment plan with the patient.  The patient was provided an opportunity to ask questions and all were answered.  The patient agreed with the plan and demonstrated an understanding of the instructions.  The patient was advised to call back if the symptoms worsen or if the condition fails to improve as anticipated.  I provided *** minutes (3:32 PM - x:xx) of face-to-face video visit time during this this encounter and > 50% was spent counseling as documented under my assessment and plan.  I provided these services from the [facility] office ***.   Lequita Asal, MD, PhD    10/27/2020, 3:15 PM  I, Mirian Mo Tufford, am acting as scribe  for [place] C. Mike Gip, MD, PhD.  I, Tremel Setters C. Mike Gip, MD, have reviewed the above documentation for accuracy and completeness, and I agree with the above.

## 2020-10-28 NOTE — Progress Notes (Signed)
Pt has not had a port flush since April 2021, discussed the importance of having port flushed every 8-12 weeks to ensure it is functional, she verbalizes u/o.

## 2020-10-29 LAB — CANCER ANTIGEN 27.29: CA 27.29: 18.2 U/mL (ref 0.0–38.6)

## 2020-11-05 ENCOUNTER — Encounter: Payer: Medicaid Other | Admitting: Obstetrics and Gynecology

## 2020-11-15 ENCOUNTER — Telehealth: Payer: Self-pay

## 2020-11-15 NOTE — Telephone Encounter (Signed)
Pt called in and stated that her last visit she was informed about a new medicine and the pt stated that she hasn't had a refill sent on it and the pt stated that she is out of the meds. [facility] on [address]. Please advise

## 2020-11-16 NOTE — Telephone Encounter (Signed)
Spoke to pt concerning her call to the office yesterday. Pt stated that she was calling about the medication phentermine and another form of the medication that was discussed in her last visit with [facility]. Pt stated that she informed AC that the medication made her feel weird and they discuss another weight loss medication.

## 2020-11-16 NOTE — Telephone Encounter (Signed)
Please advise. Thanks Coti Burd 

## 2020-11-16 NOTE — Telephone Encounter (Signed)
Please read this message to patient. See if there is an option she would prefer.   Chelsea Hudson,   Here is a list o the other medications that we can try (however it is based on your insurance which one they may cover).   1.  Liraglutide (Saxenda) is used to help people lose weight and maintain weight loss. It is used with a reduced-calorie diet and exercise. This medicine is for injection daily under the skin of your upper leg, stomach area, or upper arm.  The dose gets titrated up.  It is important to discuss with your PCP if you are on any other diabetes medications as this can affect your blood sugars (can lead to low sugars and hypoglycemia if taken with any other medications for diabetes).  Side effects can include gallbladder issues, nausea, and constipation.   2. Bupropion/Naltrexone (Contrave) combines a dopamine/norepinephrine reuptake inhibitor and an opioid receptor antagonist. This medication is a tablet, and the dose gets titrated up every few weeks. It controls cravings and addicted behaviors related to food. Side effects include: constipation, headaches, insomnia, and dry mouth.  You can be on this medication for > 1 year or longer.   3. Reginal Lutes is a newer medication, once-weekly injectable prescription. Wegovy contains semaglutide (GLP-1). ommon side effects of WegovyT may include: nausea, diarrhea, vomiting, constipation, stomach (abdomen) pain, headache, tiredness (fatigue), upset stomach, dizziness, feeling bloated, belching, gas, stomach flu, and heartburn.  There is a high demand for this particular medication so may be a slight delay in receiving it. You can be on this medication for > 1 year or longer.

## 2020-11-18 NOTE — Telephone Encounter (Signed)
Spoke to pt concerning her call to the office. Informed Pt that [facility] had sent me a list of weight loss medication that she could try other than phentermine. Sent message from [facility] to pt via mychart. Advised pt to please contact me with which medication that she would like to start taking.

## 2020-11-21 DIAGNOSIS — Z0000 Encounter for general adult medical examination without abnormal findings: Secondary | ICD-10-CM | POA: Insufficient documentation

## 2020-11-21 DIAGNOSIS — Z95828 Presence of other vascular implants and grafts: Secondary | ICD-10-CM | POA: Insufficient documentation

## 2020-12-03 ENCOUNTER — Other Ambulatory Visit: Payer: Self-pay

## 2020-12-03 ENCOUNTER — Ambulatory Visit (INDEPENDENT_AMBULATORY_CARE_PROVIDER_SITE_OTHER): Payer: Medicaid Other | Admitting: Obstetrics and Gynecology

## 2020-12-03 ENCOUNTER — Encounter: Payer: Self-pay | Admitting: Obstetrics and Gynecology

## 2020-12-03 VITALS — BP 105/74 | HR 104 | Ht 67.0 in | Wt 234.4 lb

## 2020-12-03 DIAGNOSIS — Z7689 Persons encountering health services in other specified circumstances: Secondary | ICD-10-CM | POA: Diagnosis not present

## 2020-12-03 DIAGNOSIS — I10 Essential (primary) hypertension: Secondary | ICD-10-CM | POA: Diagnosis not present

## 2020-12-03 DIAGNOSIS — E1165 Type 2 diabetes mellitus with hyperglycemia: Secondary | ICD-10-CM

## 2020-12-03 DIAGNOSIS — E669 Obesity, unspecified: Secondary | ICD-10-CM | POA: Diagnosis not present

## 2020-12-03 MED ORDER — PHENTERMINE HCL 30 MG PO CAPS: 30.0000 mg | ORAL_CAPSULE | ORAL | 0 refills | Status: DC

## 2020-12-03 NOTE — Progress Notes (Signed)
     GYNECOLOGY PROGRESS NOTE  Subjective:    Patient ID: Chelsea Hudson, female    DOB: Aug 26, 1966, 55 y.o.   MRN: [id number]  HPI  Patient is a 55 y.o. G0P0 female who presents for 1 month weight management follow up. She has utilized Phentermine for the past 2 months.  Ran out of refills . Had considered trying a different medication as she was not noticing the response that she had previously on Phentermine when she first trialed over 1.5 years ago.  Was also noting side effects of feeling "wired and wired", and dry mouth on higher dosing (37.5 mg). Had been taking the medication every other day.  Starting weight at onset of weight loss journey was 255 lbs.  Desires to get to 200 lbs.    Current interventions:  1. Diet - Has breakfast (usually small portion of previous dinner). Has a snack but skips lunch. Has a "sensible dinner".  Consuming more vegetables.  2. Activity - Does dance class 2 days per week and some other exercises at home.  3. Reports bowel movements are normal.    The following portions of the patient's history were reviewed and updated as appropriate: allergies, current medications, past family history, past medical history, past social history, past surgical history and problem list.  Review of Systems Pertinent items noted in HPI and remainder of comprehensive ROS otherwise negative.   Objective:    Vitals with BMI 12/03/2020 10/28/2020 10/18/2020  Height 5\' 7"  - 5\' 7"   Weight 234 lbs 6 oz 244 lbs 13 oz 238 lbs 10 oz  BMI 36.7 39.03 00.92  Systolic 330 076 226  Diastolic 74 83 85  Pulse 333 98 97    General appearance: alert and no distress Abdomen: soft, non-tender.  Waist circumference 48 1/2 in.    Labs:  No new labs  Assessment:   Weight management Obesity, Body mass index is 36.71 kg/m.  DM  Essential HTN   Plan:   1. Advised to continue management of diet, continue adherence to dietary recommendations and physical activity to maximize  weight loss.  Had previously discussed option of trying a different medication for her weight loss in light of her medical conditions (such as Saxenda or Contrave), or could decrease dose of Phentermine back to 30 mg from 37.5 mg to be able to take medication every day (as she is currently taking qod). After discussion of other medications, patient notes she will stick to the Phentermine at a lower dosing. Can also consider administration of Vitamin 12 to assist with weight loss efforts. Will check labs (Vitamin B12 and Vitamin D) to assess for any deficiencies.  2. DM and HTN, currently managed by PCP.   F/u in 1 month for weight check.    A total of 15 minutes were spent face-to-face with the patient during this encounter and over half of that time dealt with counseling and coordination of care.   Rubie Maid, MD [facility]

## 2020-12-03 NOTE — Progress Notes (Signed)
Pt present for weight management. Pt would like to discuss other options that are not injections and may just stay with the phentermine.

## 2020-12-04 ENCOUNTER — Other Ambulatory Visit: Payer: Self-pay | Admitting: Obstetrics and Gynecology

## 2020-12-04 DIAGNOSIS — E559 Vitamin D deficiency, unspecified: Secondary | ICD-10-CM | POA: Insufficient documentation

## 2020-12-04 LAB — VITAMIN B12: Vitamin B-12: 495 pg/mL (ref 232–1245)

## 2020-12-04 LAB — VITAMIN D 25 HYDROXY (VIT D DEFICIENCY, FRACTURES): Vit D, 25-Hydroxy: 12.1 ng/mL — ABNORMAL LOW (ref 30.0–100.0)

## 2020-12-04 MED ORDER — VITAMIN D (ERGOCALCIFEROL) 1.25 MG (50000 UNIT) PO CAPS: 50000.0000 [IU] | ORAL_CAPSULE | ORAL | 0 refills | Status: DC

## 2020-12-04 MED ORDER — CYANOCOBALAMIN 1000 MCG/ML IJ SOLN
1000.0000 ug | Freq: Once | INTRAMUSCULAR | Status: AC
Start: 2020-12-04 — End: 2020-12-03
  Administered 2020-12-03: 1000 ug via INTRAMUSCULAR

## 2020-12-04 NOTE — Patient Instructions (Signed)
Preventing Unhealthy Weight Gain, Adult Staying at a healthy weight is important to your overall health. When fat builds up in your body, you may become overweight or obese. Being overweight or obese increases your risk of developing certain health problems, such as heart disease, diabetes, sleeping problems, joint problems, and some types of cancer. Unhealthy weight gain is often the result of making unhealthy food choices or not getting enough exercise. You can make changes to your lifestyle to prevent obesity and stay as healthy as possible. What nutrition changes can be made?  Eat only as much as your body needs. To do this: ? Pay attention to signs that you are hungry or full. Stop eating as soon as you feel full. ? If you feel hungry, try drinking water first before eating. Drink enough water so your urine is clear or pale yellow. ? Eat smaller portions. Pay attention to portion sizes when eating out. ? Look at serving sizes on food labels. Most foods contain more than one serving per container. ? Eat the recommended number of calories for your gender and activity level. For most active people, a daily total of 2,000 calories is appropriate. If you are trying to lose weight or are not very active, you may need to eat fewer calories. Talk with your health care provider or a diet and nutrition specialist (dietitian) about how many calories you need each day.  Choose healthy foods, such as: ? Fruits and vegetables. At each meal, try to fill at least half of your plate with fruits and vegetables. ? Whole grains, such as whole-wheat bread, brown rice, and quinoa. ? Lean meats, such as chicken or fish. ? Other healthy proteins, such as beans, eggs, or tofu. ? Healthy fats, such as nuts, seeds, fatty fish, and olive oil. ? Low-fat or fat-free dairy products.  Check food labels, and avoid food and drinks that: ? Are high in calories. ? Have added sugar. ? Are high in sodium. ? Have saturated  fats or trans fats.  Cook foods in healthier ways, such as by baking, broiling, or grilling.  Make a meal plan for the week, and shop with a grocery list to help you stay on track with your purchases. Try to avoid going to the grocery store when you are hungry.  When grocery shopping, try to shop around the outside of the store first, where the fresh foods are. Doing this helps you to avoid prepackaged foods, which can be high in sugar, salt (sodium), and fat.   What lifestyle changes can be made?  Exercise for 30 or more minutes on 5 or more days each week. Exercising may include brisk walking, yard work, biking, running, swimming, and team sports like basketball and soccer. Ask your health care provider which exercises are safe for you.  Do muscle-strengthening activities, such as lifting weights or using resistance bands, on 2 or more days a week.  Do not use any products that contain nicotine or tobacco, such as cigarettes and e-cigarettes. If you need help quitting, ask your health care provider.  Limit alcohol intake to no more than 1 drink a day for nonpregnant women and 2 drinks a day for men. One drink equals 12 oz of beer, 5 oz of wine, or 1 oz of hard liquor.  Try to get 7-9 hours of sleep each night.   What other changes can be made?  Keep a food and activity journal to keep track of: ? What you ate and how  many calories you had. Remember to count the calories in sauces, dressings, and side dishes. ? Whether you were active, and what exercises you did. ? Your calorie, weight, and activity goals.  Check your weight regularly. Track any changes. If you notice you have gained weight, make changes to your diet or activity routine.  Avoid taking weight-loss medicines or supplements. Talk to your health care provider before starting any new medicine or supplement.  Talk to your health care provider before trying any new diet or exercise plan. Why are these changes  important? Eating healthy, staying active, and having healthy habits can help you to prevent obesity. Those changes also:  Help you manage stress and emotions.  Help you connect with friends and family.  Improve your self-esteem.  Improve your sleep.  Prevent long-term health problems. What can happen if changes are not made? Being obese or overweight can cause you to develop joint or bone problems, which can make it hard for you to stay active or do activities you enjoy. Being obese or overweight also puts stress on your heart and lungs and can lead to health problems like diabetes, heart disease, and some cancers. Where to find more information Talk with your health care provider or a dietitian about healthy eating and healthy lifestyle choices. You may also find information from:  U.S. Department of Agriculture, MyPlate: FormerBoss.no  American Heart Association: www.heart.org  Centers for Disease Control and Prevention: http://www.wolf.info/ Summary  Staying at a healthy weight is important to your overall health. It helps you to prevent certain diseases and health problems, such as heart disease, diabetes, joint problems, sleep disorders, and some types of cancer.  Being obese or overweight can cause you to develop joint or bone problems, which can make it hard for you to stay active or do activities you enjoy.  You can prevent unhealthy weight gain by eating a healthy diet, exercising regularly, not smoking, limiting alcohol, and getting enough sleep.  Talk with your health care provider or a dietitian for guidance about healthy eating and healthy lifestyle choices. This information is not intended to replace advice given to you by your health care provider. Make sure you discuss any questions you have with your health care provider. Document Revised: 03/04/2020 Document Reviewed: 03/04/2020 Elsevier Patient Education  2021 [employer].

## 2021-01-04 ENCOUNTER — Encounter: Payer: Medicaid Other | Admitting: Obstetrics and Gynecology

## 2021-01-12 ENCOUNTER — Ambulatory Visit (INDEPENDENT_AMBULATORY_CARE_PROVIDER_SITE_OTHER): Payer: Medicaid Other | Admitting: Obstetrics and Gynecology

## 2021-01-12 ENCOUNTER — Encounter: Payer: Self-pay | Admitting: Obstetrics and Gynecology

## 2021-01-12 ENCOUNTER — Other Ambulatory Visit: Payer: Self-pay

## 2021-01-12 VITALS — BP 128/77 | HR 99 | Ht 67.0 in | Wt 234.0 lb

## 2021-01-12 DIAGNOSIS — E669 Obesity, unspecified: Secondary | ICD-10-CM

## 2021-01-12 DIAGNOSIS — Z7689 Persons encountering health services in other specified circumstances: Secondary | ICD-10-CM | POA: Diagnosis not present

## 2021-01-12 DIAGNOSIS — I10 Essential (primary) hypertension: Secondary | ICD-10-CM

## 2021-01-12 DIAGNOSIS — E1165 Type 2 diabetes mellitus with hyperglycemia: Secondary | ICD-10-CM | POA: Diagnosis not present

## 2021-01-12 DIAGNOSIS — E559 Vitamin D deficiency, unspecified: Secondary | ICD-10-CM

## 2021-01-12 MED ORDER — PHENTERMINE HCL 30 MG PO CAPS: 30.0000 mg | ORAL_CAPSULE | ORAL | 0 refills | Status: DC

## 2021-01-12 MED ORDER — TOPIRAMATE 25 MG PO TABS: 25.0000 mg | ORAL_TABLET | Freq: Every day | ORAL | 0 refills | Status: DC

## 2021-01-12 NOTE — Progress Notes (Signed)
GYNECOLOGY PROGRESS NOTE  Subjective:    Patient ID: Chelsea Hudson, female    DOB: [birth year], 55 y.o.   MRN: [id number]  HPI  Patient is a 55 y.o. G0P0 female who presents for 1 month weight management follow up. She has utilized Phentermine for the past 3.5 months.   Had been taking the medication every other day to reduce side effects.  Starting weight at onset of weight loss journey was 255 lbs.  Desires to get to 200 lbs.    Notes that she recently got back from [place] from vacation. Plans to go to [place], [place] in April. Also reports 2 deaths in the family last month.   Current interventions:  1. Diet - Has sensible breakfast and dinner, skips lunch.  Consuming more vegetables. Had has been consuming more fruit as well on her vacation. Still doing good with water intake (outside of her vacation).  2. Activity - Has not been as active physically this past month.  Knows that she needs to do better.   3. Reports bowel movements are off since her vacation. Feels bloated.    The following portions of the patient's history were reviewed and updated as appropriate: allergies, current medications, past family history, past medical history, past social history, past surgical history and problem list.  Review of Systems Pertinent items noted in HPI and remainder of comprehensive ROS otherwise negative.   Objective:    Vitals with BMI 01/12/2021 12/03/2020 10/28/2020  Height 5\' 7"  5\' 7"  -  Weight 234 lbs 234 lbs 6 oz 244 lbs 13 oz  BMI 36.64 85.0 27.74  Systolic 128 786 767  Diastolic 77 74 83  Pulse 99 104 98    General appearance: alert and no distress Abdomen: soft, non-tender.  Waist circumference 49 in.    Labs:  Office Visit on 12/03/2020  Component Date Value Ref Range Status  . Vitamin B-12 12/03/2020 495  232 - 1,245 pg/mL Final  . Vit D, 25-Hydroxy 12/03/2020 12.1* 30.0 - 100.0 ng/mL Final   Comment: Vitamin D deficiency has been defined by the [place] practice guideline as a level of serum 25-OH vitamin D less than 20 ng/mL (1,2). The Endocrine Society went on to further define vitamin D insufficiency as a level between 21 and 29 ng/mL (2). 1. IOM (Institute of Medicine). 2010. Dietary reference    intakes for calcium and D. [place]: The    [employer]. 2. Holick MF, Binkley [place], Bischoff-Ferrari HA, et al.    Evaluation, treatment, and prevention of vitamin D    deficiency: an Endocrine Society clinical practice    guideline. JCEM. 2011 Jul; 96(7):1911-30.      Assessment:   Weight management Obesity, Body mass index is 36.65 kg/m.  DM  Essential HTN   Plan:   1. Strongly encouraged strict compliance of diet and resume physical activity to maximize weight loss.  Reiterated that use of Phentermine should be short term, and if she was going to have difficulties adhering to diet and exercises, should consider discontinuation of current medication, or consider switching to a different long-term weight loss medication. Patient notes she slacked this past month due to all the things going on, but is planning to get back on track now. Will also add Topamax (off-label use) to weight loss regimen to see if this will help with her weight loss.  2. DM and HTN, currently managed by PCP.  3. Vitamin D deficiency, patient reports that she has initiated her high dose  supplementation.   F/u in 1 month for weight check.    A total of 15 minutes were spent face-to-face with the patient during this encounter and over half of that time dealt with counseling and coordination of care.   Rubie Maid, MD [facility]

## 2021-01-12 NOTE — Progress Notes (Signed)
Pt present for weight management. Pt's last visit 12/03/2020 wt 234lb. Pt denies any side effects from the medication phentermine.

## 2021-01-19 ENCOUNTER — Inpatient Hospital Stay: Payer: Medicare Other | Attending: Hematology and Oncology

## 2021-01-19 ENCOUNTER — Other Ambulatory Visit: Payer: Self-pay

## 2021-01-19 DIAGNOSIS — C50411 Malignant neoplasm of upper-outer quadrant of right female breast: Secondary | ICD-10-CM | POA: Insufficient documentation

## 2021-01-19 DIAGNOSIS — Z452 Encounter for adjustment and management of vascular access device: Secondary | ICD-10-CM | POA: Diagnosis not present

## 2021-01-19 DIAGNOSIS — Z170 Estrogen receptor positive status [ER+]: Secondary | ICD-10-CM | POA: Diagnosis not present

## 2021-01-19 DIAGNOSIS — Z95828 Presence of other vascular implants and grafts: Secondary | ICD-10-CM

## 2021-01-19 MED ORDER — HEPARIN SOD (PORK) LOCK FLUSH 100 UNIT/ML IV SOLN
500.0000 [IU] | Freq: Once | INTRAVENOUS | Status: AC
  Administered 2021-01-19: 500 [IU] via INTRAVENOUS
  Filled 2021-01-19: qty 5

## 2021-01-19 MED ORDER — SODIUM CHLORIDE 0.9% FLUSH
10.0000 mL | Freq: Once | INTRAVENOUS | Status: AC
  Administered 2021-01-19: 10 mL via INTRAVENOUS
  Filled 2021-01-19: qty 10

## 2021-02-09 ENCOUNTER — Encounter: Payer: Medicaid Other | Admitting: Obstetrics and Gynecology

## 2021-02-24 ENCOUNTER — Encounter: Payer: Self-pay | Admitting: Obstetrics and Gynecology

## 2021-03-01 ENCOUNTER — Ambulatory Visit (INDEPENDENT_AMBULATORY_CARE_PROVIDER_SITE_OTHER): Payer: Medicaid Other | Admitting: Obstetrics and Gynecology

## 2021-03-01 ENCOUNTER — Encounter: Payer: Self-pay | Admitting: Obstetrics and Gynecology

## 2021-03-01 ENCOUNTER — Other Ambulatory Visit: Payer: Self-pay

## 2021-03-01 VITALS — BP 142/90 | HR 106 | Ht 67.0 in | Wt 229.0 lb

## 2021-03-01 DIAGNOSIS — I10 Essential (primary) hypertension: Secondary | ICD-10-CM

## 2021-03-01 DIAGNOSIS — Z7689 Persons encountering health services in other specified circumstances: Secondary | ICD-10-CM | POA: Diagnosis not present

## 2021-03-01 DIAGNOSIS — E669 Obesity, unspecified: Secondary | ICD-10-CM

## 2021-03-01 DIAGNOSIS — E559 Vitamin D deficiency, unspecified: Secondary | ICD-10-CM

## 2021-03-01 DIAGNOSIS — E1165 Type 2 diabetes mellitus with hyperglycemia: Secondary | ICD-10-CM

## 2021-03-01 MED ORDER — PHENTERMINE HCL 30 MG PO CAPS: 30.0000 mg | ORAL_CAPSULE | ORAL | 0 refills | Status: DC

## 2021-03-01 MED ORDER — TOPIRAMATE 25 MG PO TABS: 25.0000 mg | ORAL_TABLET | Freq: Every day | ORAL | 1 refills | Status: DC

## 2021-03-01 MED ORDER — CYANOCOBALAMIN 1000 MCG/ML IJ SOLN
1000.0000 ug | Freq: Once | INTRAMUSCULAR | Status: AC
  Administered 2021-03-01: 1000 ug via INTRAMUSCULAR

## 2021-03-01 NOTE — Progress Notes (Signed)
Weight Management-Pt stated that she was doing well. Waist measurement 48 inches.   Office B12 used.

## 2021-03-01 NOTE — Progress Notes (Signed)
GYNECOLOGY PROGRESS NOTE  Subjective:    Patient ID: Chelsea Hudson, female    DOB: 1966-04-28, 55 y.o.   MRN: [id number]  HPI  Patient is a 55 y.o. G0P0 female who presents for 1 month weight management follow up. She has utilized Phentermine for the past 4.5 months. She was also started on Topamax last visit to help increase weight loss efforts.   Had been taking the medication every other day to reduce side effects.  Starting weight at onset of weight loss journey was 255 lbs.  Desires to get to 200 lbs.    Plans vacation to [place], [place] at the end of the month.   Current interventions:  1. Diet - Has "sensible" breakfast and dinner, skips lunch.  Consuming more vegetables. Still doing good with water intake (outside of her vacation).  2. Activity - Has not been as active physically this past month due to last vacation but is still trying to do Zumba class.   3. Reports bowel movements are still "off" since her vacation.    The following portions of the patient's history were reviewed and updated as appropriate: allergies, current medications, past family history, past medical history, past social history, past surgical history and problem list.  Review of Systems Pertinent items noted in HPI and remainder of comprehensive ROS otherwise negative.   Objective:    Vitals with BMI 03/01/2021 01/12/2021 12/03/2020  Height 5\' 7"  5\' 7"  5\' 7"   Weight 229 lbs 234 lbs 234 lbs 6 oz  BMI 35.86 73.22 02.5  Systolic 427 062 376  Diastolic 90 77 74  Pulse 283 99 104    General appearance: alert and no distress Abdomen: soft, non-tender.  Waist circumference 48 in.    Labs:  Office Visit on 12/03/2020  Component Date Value Ref Range Status  . Vitamin B-12 12/03/2020 495  232 - 1,245 pg/mL Final  . Vit D, 25-Hydroxy 12/03/2020 12.1* 30.0 - 100.0 ng/mL Final   Comment: Vitamin D deficiency has been defined by the [place] practice guideline as  a level of serum 25-OH vitamin D less than 20 ng/mL (1,2). The Endocrine Society went on to further define vitamin D insufficiency as a level between 21 and 29 ng/mL (2). 1. IOM (Institute of Medicine). 2010. Dietary reference    intakes for calcium and D. [place]: The    [employer]. 2. Holick MF, Binkley [place], Bischoff-Ferrari HA, et al.    Evaluation, treatment, and prevention of vitamin D    deficiency: an Endocrine Society clinical practice    guideline. JCEM. 2011 Jul; 96(7):1911-30.      Assessment:   Weight management Obesity, Body mass index is 35.87 kg/m.  DM  Essential HTN  Vitamin D deficiency  Plan:   1. Patient with better weight loss over the past month with addition of Topamax and resumption of some physical activity.  Can continue regimen.  Will also see if Vitamin B supplementation helps with weight loss.  Given 1 dose today.  If no significant change, can discontinue.  2. DM and HTN, currently managed by PCP.  However has not been seen in "a while". Will recheck HgbA1c and lipid panel.  3. Vitamin D deficiency, patient reports that she has completed her high dose  supplementation. Will recheck levels.   F/u in 1 month for weight check.    A total of 15 minutes were spent face-to-face with the patient during  this encounter and over half of that time dealt with counseling and coordination of care.   Rubie Maid, MD [facility]

## 2021-03-02 LAB — LIPID PANEL
Chol/HDL Ratio: 5.1 ratio — ABNORMAL HIGH (ref 0.0–4.4)
Cholesterol, Total: 168 mg/dL (ref 100–199)
HDL: 33 mg/dL — ABNORMAL LOW (ref >39)
LDL Chol Calc (NIH): 109 mg/dL — ABNORMAL HIGH (ref 0–99)
Triglycerides: 147 mg/dL (ref 0–149)
VLDL Cholesterol Cal: 26 mg/dL (ref 5–40)

## 2021-03-02 LAB — HEMOGLOBIN A1C
Est. average glucose Bld gHb Est-mCnc: 137 mg/dL
Hgb A1c MFr Bld: 6.4 % — ABNORMAL HIGH (ref 4.8–5.6)

## 2021-03-02 LAB — VITAMIN D 25 HYDROXY (VIT D DEFICIENCY, FRACTURES): Vit D, 25-Hydroxy: 53.5 ng/mL (ref 30.0–100.0)

## 2021-03-08 ENCOUNTER — Other Ambulatory Visit: Payer: Self-pay

## 2021-03-08 ENCOUNTER — Encounter: Payer: Self-pay | Admitting: Family Medicine

## 2021-03-08 ENCOUNTER — Ambulatory Visit: Payer: Medicaid Other | Admitting: Family Medicine

## 2021-03-08 VITALS — BP 142/90 | HR 92 | Temp 97.1°F | Ht 67.0 in | Wt 231.6 lb

## 2021-03-08 DIAGNOSIS — H6121 Impacted cerumen, right ear: Secondary | ICD-10-CM

## 2021-03-08 DIAGNOSIS — S00411A Abrasion of right ear, initial encounter: Secondary | ICD-10-CM

## 2021-03-08 NOTE — Progress Notes (Signed)
Subjective:    Patient ID: Chelsea Hudson, female    DOB: 09-27-1966, 55 y.o.   MRN: [id number]  Chelsea Hudson is a 55 y.o. female presenting on 03/08/2021 for Ear Pain (Right ear and feels more pressure than the pain. Pt states she washed her hair on Saturday and felt water went in.)  Previous PCP Cyndia Skeeters, FNP   HPI   Right Ear Impacted Cerumen / Fullness Reports that she got some water trapped in R ear on weekend on Saturday, and said that the water did not come out, and it felt like she has had pressure in that R ear, occasionally will get some pain. The left ear does not bother her at all. Admits some hearing muffled on R side. Denies any sinus pain or pressure or allergies   Depression screen [facility] 2/9 08/24/2020 12/03/2019 09/26/2018  Decreased Interest 0 0 0  Down, Depressed, Hopeless 0 0 0  PHQ - 2 Score 0 0 0  Some recent data might be hidden    Social History   Tobacco Use  . Smoking status: Former Smoker    Packs/day: 0.25    Years: 25.00    Pack years: 6.25    Types: Cigarettes    Quit date: 10/20/2017    Years since quitting: 3.3  . Smokeless tobacco: Former Network engineer  . Vaping Use: Never used  Substance Use Topics  . Alcohol use: Not Currently    Comment: occas  . Drug use: No    Review of Systems Per HPI unless specifically indicated above     Objective:    BP (!) 142/90 (BP Location: Left Arm, Patient Position: Sitting, Cuff Size: Normal)   Pulse 92   Temp (!) 97.1 F (36.2 C) (Temporal)   Ht 5\' 7"  (1.702 m)   Wt 231 lb 9.6 oz (105.1 kg)   LMP 01/31/2014 (Approximate) Comment: LMP was 3 years ago.  SpO2 99%   BMI 36.27 kg/m   Wt Readings from Last 3 Encounters:  03/08/21 231 lb 9.6 oz (105.1 kg)  03/01/21 229 lb (103.9 kg)  01/12/21 234 lb (106.1 kg)    Physical Exam Vitals and nursing note reviewed.  Constitutional:      General: She is not in acute distress.    Appearance: She is well-developed. She is not diaphoretic.      Comments: Well-appearing, comfortable, cooperative  HENT:     Head: Normocephalic and atraumatic.     Right Ear: There is impacted cerumen.     Left Ear: Tympanic membrane, ear canal and external ear normal. There is no impacted cerumen.     Ears:     Comments: Unable to view R TM due to obstructed cerumen blocking. Eyes:     General:        Right eye: No discharge.        Left eye: No discharge.     Conjunctiva/sclera: Conjunctivae normal.  Cardiovascular:     Rate and Rhythm: Normal rate.  Pulmonary:     Effort: Pulmonary effort is normal.  Skin:    General: Skin is warm and dry.     Findings: No erythema or rash.  Neurological:     Mental Status: She is alert and oriented to person, place, and time.  Psychiatric:        Behavior: Behavior normal.     Comments: Well groomed, good eye contact, normal speech and thoughts    ________________________________________________________ PROCEDURE NOTE  Date: 03/08/21 Right Ear Lavage / Cerumen Removal Discussed benefits and risks (including pain / discomforts, dizziness, minor abrasion of ear canal). Verbal consent given by patient. Medication: Carbamide peroxide ear drops, Ear Lavage Solution (warm water + hydrogen peroxide) Performed by Rosalyn Charters CMA Student / Loni Muse CMA / Dr Parks Ranger Several drops of carbamide peroxide placed RIGHT ear, allowed to sit for few minutes. Ear lavage solution flushed into one ear at a time in attempt to dislodge and remove ear wax. Only RIGHT ear  Repeat Ear Exam: - Partially successful removal of main impacted cerumen in middle of ear canal, has some residual darker dry deeper cerumen at back of TM, unresolved. Some superficial abrasion of ear canal with slight bleeding, seems to have reached hemostasis but has residual blood in ear canal, after remaining cerumen was removed via curette.    Results for orders placed or performed in visit on 03/01/21  Vitamin D (25 hydroxy)  Result  Value Ref Range   Vit D, 25-Hydroxy 53.5 30.0 - 100.0 ng/mL  Hemoglobin A1c  Result Value Ref Range   Hgb A1c MFr Bld 6.4 (H) 4.8 - 5.6 %   Est. average glucose Bld gHb Est-mCnc 137 mg/dL  Lipid panel  Result Value Ref Range   Cholesterol, Total 168 100 - 199 mg/dL   Triglycerides 147 0 - 149 mg/dL   HDL 33 (L) >39 mg/dL   VLDL Cholesterol Cal 26 5 - 40 mg/dL   LDL Chol Calc (NIH) 109 (H) 0 - 99 mg/dL   Chol/HDL Ratio 5.1 (H) 0.0 - 4.4 ratio      Assessment & Plan:   Problem List Items Addressed This Visit   None   Visit Diagnoses    Right ear impacted cerumen    -  Primary      Significant amount of large thick impacted cerumen Right ear, suspected primary cause of current reduced bilateral hearing and ear fullness  Plan: 1. Partially successful office ear lavage cerumen removal today, re-evaluated with R ear with some mild abrasion superficially as a result of lavage / curette, and has only residual deeper cerumen still impacted against TM but majority of wax removed 2. Counseled on avoiding Q-tips and may use Kleenex as wick, use OTC Debrox as needed 3. Follow-up as needed  May monitor symptoms closely, now she is dramatically improved. She should follow-up if unresolved, try OTC option for ear drops and flushing, and may consider ENT in future. We can repeat flushing again if indicated. Return sooner if concern for infection from abrasion.  No orders of the defined types were placed in this encounter.     Follow up plan: Return if symptoms worsen or fail to improve.   Nobie Putnam, [facility] 03/08/2021, 1:29 PM

## 2021-03-08 NOTE — Patient Instructions (Addendum)
Thank you for coming to the office today.  You have thick impacted ear wax (cerumen) blocking ear canal and ear drum on R side. This is the most likely cause of reduced hearing and ear fullness and discomfort. - We were able to remove almost all of the ear wax with flushing in the office today  Recommend in future try using OTC ear drops at home in the future if ear wax builds up again, over the counter Debrox (Carbamide peroxide), use on both sides following instructions on bottle, pharmacist will direct you to the appropriate ear drops if you need help. May take a week or more.  Avoid using Q-tips inside ears, as this can push wax deeper, but you can try to use rolled up kleenex as a wick to absorb fluid and wax as well.  If you are not making progress with ear wax removal at home, or the problem keeps coming back, please notify our office or return for re-evaluation, and we can discuss referral to ENT office for more formal ear wax removal.   Please schedule a Follow-up Appointment to: Return if symptoms worsen or fail to improve.  If you have any other questions or concerns, please feel free to call the office or send a message through [place]. You may also schedule an earlier appointment if necessary.  Additionally, you may be receiving a survey about your experience at our office within a few days to 1 week by e-mail or mail. We value your feedback.  Nobie Putnam, DO [place]

## 2021-03-28 NOTE — Progress Notes (Signed)
Pt present for weight management. Pt's last visit 03/01/2021 weight 229. Today's visit    BMI:35.44.

## 2021-03-29 ENCOUNTER — Ambulatory Visit (INDEPENDENT_AMBULATORY_CARE_PROVIDER_SITE_OTHER): Payer: Medicare Other | Admitting: Obstetrics and Gynecology

## 2021-03-29 ENCOUNTER — Encounter: Payer: Self-pay | Admitting: Obstetrics and Gynecology

## 2021-03-29 ENCOUNTER — Other Ambulatory Visit: Payer: Self-pay

## 2021-03-29 VITALS — BP 123/84 | HR 98 | Ht 67.0 in | Wt 226.3 lb

## 2021-03-29 DIAGNOSIS — Z7689 Persons encountering health services in other specified circumstances: Secondary | ICD-10-CM | POA: Diagnosis not present

## 2021-03-29 DIAGNOSIS — E669 Obesity, unspecified: Secondary | ICD-10-CM

## 2021-03-29 DIAGNOSIS — E1165 Type 2 diabetes mellitus with hyperglycemia: Secondary | ICD-10-CM | POA: Diagnosis not present

## 2021-03-29 DIAGNOSIS — I10 Essential (primary) hypertension: Secondary | ICD-10-CM | POA: Diagnosis not present

## 2021-03-29 MED ORDER — TOPIRAMATE 50 MG PO TABS: 50.0000 mg | ORAL_TABLET | Freq: Every day | ORAL | 0 refills | Status: DC

## 2021-03-29 MED ORDER — PHENTERMINE HCL 37.5 MG PO CAPS: 37.5000 mg | ORAL_CAPSULE | ORAL | 1 refills | Status: DC

## 2021-03-29 NOTE — Progress Notes (Signed)
GYNECOLOGY PROGRESS NOTE  Subjective:    Patient ID: Chelsea Hudson, female    DOB: 03/25/1966, 55 y.o.   MRN: [id number]  HPI  Patient is a 55 y.o. G0P0 female who presents for 1 month weight management follow up. She has utilized Phentermine for the past 5.5 months. Also on Topamax (for the past 2 months) to help increase weight loss efforts.   Had been taking the medication every other day to reduce side effects.  Starting weight at onset of weight loss journey was 255 lbs.  Desires to get to 200 lbs. Recently returned from vacation.     Current interventions:  1. Diet - Has "sensible" breakfast and dinner, skips lunch.  Overall eating healthier. Still doing good with water intake   2. Activity - Zumba classes every other day.  Also doing some stomach exercises while driving.  3. Reports bowel movements are normal again.    The following portions of the patient's history were reviewed and updated as appropriate: allergies, current medications, past family history, past medical history, past social history, past surgical history and problem list.  Review of Systems Pertinent items noted in HPI and remainder of comprehensive ROS otherwise negative.   Objective:    Vitals with BMI 03/29/2021 03/08/2021 03/01/2021  Height 5\' 7"  5\' 7"  5\' 7"   Weight 226 lbs 5 oz 231 lbs 10 oz 229 lbs  BMI 35.44 02.40 97.35  Systolic 329 924 268  Diastolic 84 90 90  Pulse 98 92 106    General appearance: alert and no distress Abdomen: soft, non-tender.  Waist circumference 48 in.    Labs:  Office Visit on 03/01/2021  Component Date Value Ref Range Status  . Vit D, 25-Hydroxy 03/01/2021 53.5  30.0 - 100.0 ng/mL Final   Comment: Vitamin D deficiency has been defined by the [place] practice guideline as a level of serum 25-OH vitamin D less than 20 ng/mL (1,2). The Endocrine Society went on to further define vitamin D insufficiency as a level between 21  and 29 ng/mL (2). 1. IOM (Institute of Medicine). 2010. Dietary reference    intakes for calcium and D. [place]: The    [employer]. 2. Holick MF, Binkley [place], Bischoff-Ferrari HA, et al.    Evaluation, treatment, and prevention of vitamin D    deficiency: an Endocrine Society clinical practice    guideline. JCEM. 2011 Jul; 96(7):1911-30.   Marland Kitchen Hgb A1c MFr Bld 03/01/2021 6.4* 4.8 - 5.6 % Final   Comment:          Prediabetes: 5.7 - 6.4          Diabetes: >6.4          Glycemic control for adults with diabetes: <7.0   . Est. average glucose Bld gHb Est-m* 03/01/2021 137  mg/dL Final  . Cholesterol, Total 03/01/2021 168  100 - 199 mg/dL Final  . Triglycerides 03/01/2021 147  0 - 149 mg/dL Final  . HDL 03/01/2021 33* >39 mg/dL Final  . VLDL Cholesterol Cal 03/01/2021 26  5 - 40 mg/dL Final  . LDL Chol Calc (NIH) 03/01/2021 109* 0 - 99 mg/dL Final  . Chol/HDL Ratio 03/01/2021 5.1* 0.0 - 4.4 ratio Final   Comment:                                   T.  Chol/HDL Ratio                                             Men  Women                               1/2 Avg.Risk  3.4    3.3                                   Avg.Risk  5.0    4.4                                2X Avg.Risk  9.6    7.1                                3X Avg.Risk 23.4   11.0      Assessment:   Weight management Obesity, Body mass index is 35.44 kg/m.  DM  Essential HTN  Vitamin D deficiency  Plan:   1. Patient with better weight loss over the past month with addition of Topamax and resumption of some physical activity.  Can continue regimen.   2. DM and HTN, currently managed by PCP.  Lipid panel and A1c improving. HTN well controlled, tolerating Phentermine.    F/u in 1 month for annual exam and next weight check.      Rubie Maid, MD [facility]

## 2021-04-26 ENCOUNTER — Ambulatory Visit: Payer: Medicaid Other

## 2021-04-28 ENCOUNTER — Ambulatory Visit (INDEPENDENT_AMBULATORY_CARE_PROVIDER_SITE_OTHER): Payer: Medicare Other

## 2021-04-28 ENCOUNTER — Other Ambulatory Visit: Payer: Self-pay

## 2021-04-28 VITALS — BP 129/83 | HR 97 | Ht 67.0 in | Wt 226.7 lb

## 2021-04-28 DIAGNOSIS — E669 Obesity, unspecified: Secondary | ICD-10-CM | POA: Diagnosis not present

## 2021-04-28 DIAGNOSIS — Z7689 Persons encountering health services in other specified circumstances: Secondary | ICD-10-CM

## 2021-04-28 NOTE — Progress Notes (Signed)
Pt present for weight management. Pt's last visit 03/29/2021 weight 226 lb BMI 35.44. pt's visit today wt 226.7lb BMI  35.51 waist 48"  Pt stated that started taking the medication 3 days due to the pharmacy did not have the medication in stock. Pt has a follow up appointment to see the provider in 1 month.

## 2021-05-03 ENCOUNTER — Inpatient Hospital Stay: Payer: Medicare Other | Attending: Hematology and Oncology | Admitting: Oncology

## 2021-05-03 ENCOUNTER — Other Ambulatory Visit: Payer: Self-pay

## 2021-05-03 ENCOUNTER — Encounter: Payer: Self-pay | Admitting: Oncology

## 2021-05-03 ENCOUNTER — Inpatient Hospital Stay: Payer: Medicare Other

## 2021-05-03 VITALS — BP 144/83 | HR 82 | Temp 97.9°F | Resp 20 | Wt 220.7 lb

## 2021-05-03 DIAGNOSIS — Z79899 Other long term (current) drug therapy: Secondary | ICD-10-CM | POA: Diagnosis not present

## 2021-05-03 DIAGNOSIS — C50411 Malignant neoplasm of upper-outer quadrant of right female breast: Secondary | ICD-10-CM | POA: Insufficient documentation

## 2021-05-03 DIAGNOSIS — T451X5A Adverse effect of antineoplastic and immunosuppressive drugs, initial encounter: Secondary | ICD-10-CM | POA: Diagnosis not present

## 2021-05-03 DIAGNOSIS — Z923 Personal history of irradiation: Secondary | ICD-10-CM | POA: Insufficient documentation

## 2021-05-03 DIAGNOSIS — Z170 Estrogen receptor positive status [ER+]: Secondary | ICD-10-CM | POA: Insufficient documentation

## 2021-05-03 DIAGNOSIS — Z9221 Personal history of antineoplastic chemotherapy: Secondary | ICD-10-CM | POA: Diagnosis not present

## 2021-05-03 DIAGNOSIS — Z9011 Acquired absence of right breast and nipple: Secondary | ICD-10-CM | POA: Diagnosis not present

## 2021-05-03 DIAGNOSIS — E876 Hypokalemia: Secondary | ICD-10-CM | POA: Diagnosis not present

## 2021-05-03 DIAGNOSIS — Z79811 Long term (current) use of aromatase inhibitors: Secondary | ICD-10-CM | POA: Insufficient documentation

## 2021-05-03 DIAGNOSIS — G620 Drug-induced polyneuropathy: Secondary | ICD-10-CM | POA: Insufficient documentation

## 2021-05-03 DIAGNOSIS — D649 Anemia, unspecified: Secondary | ICD-10-CM | POA: Diagnosis not present

## 2021-05-03 LAB — CBC WITH DIFFERENTIAL/PLATELET
Abs Immature Granulocytes: 0.03 10*3/uL (ref 0.00–0.07)
Basophils Absolute: 0 10*3/uL (ref 0.0–0.1)
Basophils Relative: 0 %
Eosinophils Absolute: 0.1 10*3/uL (ref 0.0–0.5)
Eosinophils Relative: 1 %
HCT: 34.7 % — ABNORMAL LOW (ref 36.0–46.0)
Hemoglobin: 11 g/dL — ABNORMAL LOW (ref 12.0–15.0)
Immature Granulocytes: 0 %
Lymphocytes Relative: 24 %
Lymphs Abs: 1.9 10*3/uL (ref 0.7–4.0)
MCH: 25.7 pg — ABNORMAL LOW (ref 26.0–34.0)
MCHC: 31.7 g/dL (ref 30.0–36.0)
MCV: 81.1 fL (ref 80.0–100.0)
Monocytes Absolute: 0.3 10*3/uL (ref 0.1–1.0)
Monocytes Relative: 4 %
Neutro Abs: 5.4 10*3/uL (ref 1.7–7.7)
Neutrophils Relative %: 71 %
Platelets: 390 10*3/uL (ref 150–400)
RBC: 4.28 MIL/uL (ref 3.87–5.11)
RDW: 17.1 % — ABNORMAL HIGH (ref 11.5–15.5)
WBC: 7.7 10*3/uL (ref 4.0–10.5)
nRBC: 0 % (ref 0.0–0.2)

## 2021-05-03 LAB — COMPREHENSIVE METABOLIC PANEL
ALT: 18 U/L (ref 0–44)
AST: 18 U/L (ref 15–41)
Albumin: 3.6 g/dL (ref 3.5–5.0)
Alkaline Phosphatase: 88 U/L (ref 38–126)
Anion gap: 7 (ref 5–15)
BUN: 16 mg/dL (ref 6–20)
CO2: 25 mmol/L (ref 22–32)
Calcium: 9.3 mg/dL (ref 8.9–10.3)
Chloride: 103 mmol/L (ref 98–111)
Creatinine, Ser: 0.92 mg/dL (ref 0.44–1.00)
GFR, Estimated: >60 mL/min (ref >60)
Glucose, Bld: 118 mg/dL — ABNORMAL HIGH (ref 70–99)
Potassium: 3.5 mmol/L (ref 3.5–5.1)
Sodium: 135 mmol/L (ref 135–145)
Total Bilirubin: 0.4 mg/dL (ref 0.3–1.2)
Total Protein: 7.4 g/dL (ref 6.5–8.1)

## 2021-05-03 LAB — VITAMIN B12: Vitamin B-12: 237 pg/mL (ref 180–914)

## 2021-05-03 LAB — IRON AND TIBC
Iron: 34 ug/dL (ref 28–170)
Saturation Ratios: 12 % (ref 10.4–31.8)
TIBC: 297 ug/dL (ref 250–450)
UIBC: 263 ug/dL

## 2021-05-03 LAB — FERRITIN: Ferritin: 74 ng/mL (ref 11–307)

## 2021-05-03 LAB — FOLATE: Folate: 7.9 ng/mL (ref >5.9)

## 2021-05-03 MED ORDER — SODIUM CHLORIDE 0.9% FLUSH
10.0000 mL | INTRAVENOUS | Status: DC | PRN
  Administered 2021-05-03: 10 mL via INTRAVENOUS
  Filled 2021-05-03: qty 10

## 2021-05-03 MED ORDER — HEPARIN SOD (PORK) LOCK FLUSH 100 UNIT/ML IV SOLN
500.0000 [IU] | Freq: Once | INTRAVENOUS | Status: AC
  Administered 2021-05-03: 500 [IU] via INTRAVENOUS
  Filled 2021-05-03: qty 5

## 2021-05-03 NOTE — Progress Notes (Signed)
[facility]  [address] [place], [place] [address] Phone: [phone]  Fax: [phone]   Clinic Day:  05/03/2021  Referring physician: Verl Bangs, FNP   Chief Complaint: Chelsea Hudson is a 55 y.o. female with stage IIIC right breast cancer who is seen for 8 month assessment.   HPI: The patient was last seen in the medical oncology clinic on 10/28/21.   Screening left mammogram on 10/18/2020 revealed no evidence of malignancy.  Patient reports that she is doing well.  She is currently trying to lose weight.  She is down approximately 6 pounds from her last visit.  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  The patient performs monthly breast exams and has no concerns. She is still taking Femara and needs a refill.   She needs to schedule a bone density.   She takes oral potassium 10 mEq BID.   Past Medical History:  Diagnosis Date   Arthritis    SHOULDER   Breast cancer ([place]) 02/2017   rt breast   Breast cancer ([place]) 2018   Cancer ([place]) 02/28/2017   INVASIVE MAMMARY CARCINOMA WITH MUCINOUS FEATURES.   Colonic diverticular abscess 06/21/2017   Colonoscopy 06/25/2017: No evidence of malignancy.   Diabetes mellitus without complication ([place])    Hypertension    Irregular heart beat    PT STATES IT "SKIPS A BEAT"    Obesity    Personal history of chemotherapy    Personal history of radiation therapy     Past Surgical History:  Procedure Laterality Date   ANKLE SURGERY     BREAST BIOPSY Right 02/28/2017   INVASIVE MAMMARY CARCINOMA WITH MUCINOUS FEATURES.   BREAST CYST ASPIRATION Right    NEG   COLONOSCOPY WITH PROPOFOL N/A 06/25/2017   Procedure: COLONOSCOPY WITH PROPOFOL;  Surgeon: Jonathon Bellows, MD;  Location: [facility] ENDOSCOPY;  Service: Gastroenterology;  Laterality: N/A;   MASTECTOMY, PARTIAL Right 07/30/2017   Procedure: MASTECTOMY PARTIAL;  Surgeon:  Robert Bellow, MD;  Location: [facility] ORS;  Service: General;  Laterality: Right;   PORTACATH PLACEMENT Left 03/15/2017   Procedure: INSERTION PORT-A-CATH;  Surgeon: Robert Bellow, MD;  Location: [facility] ORS;  Service: General;  Laterality: Left;   RE-EXCISION OF BREAST LUMPECTOMY Right 08/17/2017    INVASIVE CARCINOMA EXTENDS TO NEW LATERAL MARGIN. /RE-EXCISION OF BREAST LUMPECTOMY;: Byrnett, Forest Gleason, MD;  [facility] ORS; General;  Laterality: Right;   SENTINEL NODE BIOPSY Right 07/30/2017   Procedure: SENTINEL NODE BIOPSY;  Surgeon: Robert Bellow, MD;  Location: [facility] ORS;  Service: General;  Laterality: Right;   SIMPLE MASTECTOMY WITH AXILLARY SENTINEL NODE BIOPSY Right 09/28/2017   Procedure: SIMPLE MASTECTOMY;  Surgeon: Robert Bellow, MD;  Location: [facility] ORS;  Service: General;  Laterality: Right;    Family History  Problem Relation Age of Onset   Diabetes Father    Stroke Father    Hypertension Father    Hypertension Mother    Brain cancer Maternal Aunt 60   Diabetes Sister    Colon cancer Neg Hx    Breast cancer Neg Hx     Social History:  reports that she quit smoking about 3 years ago. Her smoking use included cigarettes. She has a 6.25 pack-year smoking history. She has quit using smokeless tobacco. She reports previous alcohol use. She reports that she does not use drugs. She is a Administrator. She denies any exposure  to any toxins and radiations. The patient is alone today.  Allergies:  Allergies  Allergen Reactions   Other Hives and Itching    [name]     Current Medications: Current Outpatient Medications  Medication Sig Dispense Refill   gabapentin (NEURONTIN) 400 MG capsule Take 1 capsule (400 mg total) by mouth at bedtime. 90 capsule 1   letrozole (FEMARA) 2.5 MG tablet Take 1 tablet (2.5 mg total) by mouth daily. 90 tablet 3   metFORMIN (GLUCOPHAGE) 500 MG  tablet TAKE 2 TABLETS BY MOUTH ONCE DAILY WITH BREAKFAST AND 1 WITH SUPPER 270 tablet 1   phentermine 37.5 MG capsule Take 1 capsule (37.5 mg total) by mouth every morning. 30 capsule 1   potassium chloride (KLOR-CON) 10 MEQ tablet Take 1 tablet (10 mEq total) by mouth 2 (two) times daily. 180 tablet 1   topiramate (TOPAMAX) 50 MG tablet Take 1 tablet (50 mg total) by mouth daily. 90 tablet 0   triamterene-hydrochlorothiazide (MAXZIDE-25) 37.5-25 MG tablet Take 1 tablet by mouth daily. 90 tablet 1   No current facility-administered medications for this visit.    Review of Systems  Constitutional:  Positive for malaise/fatigue. Negative for chills, fever and weight loss.  HENT:  Negative for congestion, ear pain and tinnitus.   Eyes: Negative.  Negative for blurred vision and double vision.  Respiratory: Negative.  Negative for cough, sputum production and shortness of breath.   Cardiovascular: Negative.  Negative for chest pain, palpitations and leg swelling.  Gastrointestinal: Negative.  Negative for abdominal pain, constipation, diarrhea, nausea and vomiting.  Genitourinary:  Negative for dysuria, frequency and urgency.  Musculoskeletal:  Positive for joint pain. Negative for back pain, falls and neck pain.  Skin: Negative.  Negative for rash.  Neurological: Negative.  Negative for weakness and headaches.  Endo/Heme/Allergies: Negative.  Does not bruise/bleed easily.  Psychiatric/Behavioral: Negative.  Negative for depression. The patient is not nervous/anxious and does not have insomnia.   Performance status (ECOG): 1  Vitals Blood pressure (!) 144/83, pulse 82, temperature 97.9 F (36.6 C), resp. rate 20, weight 220 lb 10.9 oz (100.1 kg), last menstrual period 01/31/2014, SpO2 100 %.   Physical Exam Constitutional:      Appearance: She is well-developed. She is obese.  HENT:     Head: Normocephalic and atraumatic.  Eyes:     Pupils: Pupils are equal, round, and reactive to  light.  Cardiovascular:     Rate and Rhythm: Normal rate and regular rhythm.     Heart sounds: No murmur heard. Pulmonary:     Effort: Pulmonary effort is normal.     Breath sounds: Normal breath sounds. No wheezing.  Abdominal:     General: Bowel sounds are normal. There is no distension.     Palpations: Abdomen is soft. There is no mass.     Tenderness: There is no abdominal tenderness.  Musculoskeletal:        General: Normal range of motion.     Cervical back: Normal range of motion.  Skin:    General: Skin is warm and dry.  Neurological:     Mental Status: She is alert and oriented to person, place, and time.  Psychiatric:        Behavior: Behavior normal.   Infusion on 05/03/2021  Component Date Value Ref Range Status   Sodium 05/03/2021 135  135 - 145  mmol/L Final   Potassium 05/03/2021 3.5  3.5 - 5.1 mmol/L Final   Chloride 05/03/2021 103  98 - 111 mmol/L Final   CO2 05/03/2021 25  22 - 32 mmol/L Final   Glucose, Bld 05/03/2021 118 (A) 70 - 99 mg/dL Final   Glucose reference range applies only to samples taken after fasting for at least 8 hours.   BUN 05/03/2021 16  6 - 20 mg/dL Final   Creatinine, Ser 05/03/2021 0.92  0.44 - 1.00 mg/dL Final   Calcium 05/03/2021 9.3  8.9 - 10.3 mg/dL Final   Total Protein 05/03/2021 7.4  6.5 - 8.1 g/dL Final   Albumin 05/03/2021 3.6  3.5 - 5.0 g/dL Final   AST 05/03/2021 18  15 - 41 U/L Final   ALT 05/03/2021 18  0 - 44 U/L Final   Alkaline Phosphatase 05/03/2021 88  38 - 126 U/L Final   Total Bilirubin 05/03/2021 0.4  0.3 - 1.2 mg/dL Final   GFR, Estimated 05/03/2021 >60  >60 mL/min Final   Comment: (NOTE) Calculated using the CKD-EPI Creatinine Equation (2021)    Anion gap 05/03/2021 7  5 - 15 Final   Performed at [facility], [address], [place], [place] [address]   WBC 05/03/2021 7.7  4.0 - 10.5 K/uL Final   RBC 05/03/2021 4.28  3.87 - 5.11 MIL/uL Final   Hemoglobin 05/03/2021 11.0 (A) 12.0 - 15.0 g/dL  Final   HCT 05/03/2021 34.7 (A) 36.0 - 46.0 % Final   MCV 05/03/2021 81.1  80.0 - 100.0 fL Final   MCH 05/03/2021 25.7 (A) 26.0 - 34.0 pg Final   MCHC 05/03/2021 31.7  30.0 - 36.0 g/dL Final   RDW 05/03/2021 17.1 (A) 11.5 - 15.5 % Final   Platelets 05/03/2021 390  150 - 400 K/uL Final   nRBC 05/03/2021 0.0  0.0 - 0.2 % Final   Neutrophils Relative % 05/03/2021 71  % Final   Neutro Abs 05/03/2021 5.4  1.7 - 7.7 K/uL Final   Lymphocytes Relative 05/03/2021 24  % Final   Lymphs Abs 05/03/2021 1.9  0.7 - 4.0 K/uL Final   Monocytes Relative 05/03/2021 4  % Final   Monocytes Absolute 05/03/2021 0.3  0.1 - 1.0 K/uL Final   Eosinophils Relative 05/03/2021 1  % Final   Eosinophils Absolute 05/03/2021 0.1  0.0 - 0.5 K/uL Final   Basophils Relative 05/03/2021 0  % Final   Basophils Absolute 05/03/2021 0.0  0.0 - 0.1 K/uL Final   Immature Granulocytes 05/03/2021 0  % Final   Abs Immature Granulocytes 05/03/2021 0.03  0.00 - 0.07 K/uL Final   Performed at [facility], [address], [place], [place] [address]    Assessment:  Chelsea Hudson is a 55 y.o. 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  Biopsy on 02/28/2017 revealed invasive carcinoma with mucinous features.  Lymph node biopsy revealed metastatic disease.  Tumor was ER positive (90%), PR positive (90%) and HER-2/neu negative.  She had clinical stage T3N3aM0 breast cancer.   PET scan on 03/09/2017 revealed hypermetabolic right axillary/subpectoral adenopathy.  There was low-grade activity in the right upper breast likely at the postoperative site.  Appearance was compatible with metastatic spread to right axillary/subpectoral lymph nodes.  There was some sigmoid colon diverticulosis, with faint inflammatory findings adjacent to the sigmoid colon proximally, and with accentuated activity in the  involved segment of the sigmoid colon.     Partial mastectomy and sentinel lymph node biopsy on 07/30/2017 revealed a 1.8 cm invasive mammary carcinoma with mucinous features.  There was lymphovascular invasion.  Eight of 12 lymph nodes were positive for metastatic disease.   She required multiple excisions to obtain clear margins resulting in a full mastectomy on 09/28/2017.  She underwent complete axillary node dissection on 03/04/2018.  Level 2 and 3 lymph node dissection revealed 3 of 3 lymph nodes positive for carcinoma.  The largest tumor deposit was at least 20 mm.  Extracapsular extension was present < 1 mm beyond the lymph node capsule.    Mammogram and ultrasound on 02/09/2017 revealed an irregular 2.1 x 1.0 x 0.9 cm hypoechoic mass with a feeding vessel 11:00, 8 cm from the nipple.  There was overlying skin thickness.  Ultrasound of the right axilla revealed at least 3 bulky lymph nodes deep to the pectoralis (level 2 lymphadenopathy).  Lymph node measured 1.6 x 1.4 x 1.9 cm and 2 x 1.7 x 1.8 cm.   She received 4 cycles of AC (03/22/2017 - 05/02/2017) and 7 of 12 cycles of neoadjuvant Taxol (05/16/2017 - 06/27/2017).  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  She received adjuvant radiation at [facility].  She began Femara in 05/2018.   Left mammogram on 10/09/2018 revealed no suspicious masses, asymmetries, or malignant calcifications.  Left screening mammogram on 10/13/2019 showed no evidence of malignancy.     CA27.29 has been followed: 21.1 on 06/12/2019, 18.4 on 10/15/2019, 16.8 on 02/12/2020, and 18.2 on 10/28/2020.  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   Bone density on 07/17/2018 was normal with a T-score of -0.3 at the AP spine L1-L4.  She is not interested in the COVID-19 vaccine.   Symptomatically, she is feeling well.  She is taking her Femara as instructed.  She needs to schedule a bone scan.  Plan: Stage IIIC right breast cancer She  received neoadjuvant chemotherapy (4 cycles of AC and 7 weeks of Taxol). She underwent completion mastectomy and radiation. She began Femara on 05/2018. Clinically, she is doing well. Exam reveals no evidence of recurrent disease. Left screening mammogram on 10/18/20 revealed no evidence of malignancy.   She is due for repeat mammogram in November 2022.  Orders placed. Continue Femara.    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 Continue to monitor  Hypokalemia Potassium 3.5. Continue potassium 10 mEq p.o. twice daily  Health maintenance Bone density on 07/17/2018 was normal. Discuss need for bone density every 2 years on an aromatase inhibitor. Patient states she was unable to make last appointment for her bone density scan. We will get this rescheduled ASAP.  Anemia Labs from 05/03/2021 show a hemoglobin of 11.0 which is slightly lower than before. Check vitamin B12, iron, folate and ferritin.  Will call patient with results  Disposition Schedule mammogram in Nov 2022 Bone density ASAP.  She is scheduled for bone density tomorrow 05/04/2021. RTC in 6 months for MD assessment, labs (CBC with diff, CMP, CA27.29), and review of bone density.   Greater than 50% was spent in counseling and coordination of care with this patient including but not limited to discussion of the relevant topics above (See A&P) including, but not limited to diagnosis and management of acute and chronic medical conditions.   Faythe Casa, NP 05/03/2021 3:07 PM

## 2021-05-04 ENCOUNTER — Ambulatory Visit
Admission: RE | Admit: 2021-05-04 | Discharge: 2021-05-04 | Disposition: A | Discharge status: Home / Self Care | Payer: Medicare Other | Source: Ambulatory Visit | Attending: Oncology | Admitting: Oncology

## 2021-05-04 DIAGNOSIS — C50411 Malignant neoplasm of upper-outer quadrant of right female breast: Secondary | ICD-10-CM | POA: Diagnosis not present

## 2021-05-04 DIAGNOSIS — Z170 Estrogen receptor positive status [ER+]: Secondary | ICD-10-CM | POA: Diagnosis not present

## 2021-05-05 LAB — CA 27.29 (SERIAL MONITOR): CA 27.29: 14 U/mL (ref 0.0–38.6)

## 2021-05-24 ENCOUNTER — Ambulatory Visit (INDEPENDENT_AMBULATORY_CARE_PROVIDER_SITE_OTHER): Payer: Medicare Other | Admitting: Obstetrics and Gynecology

## 2021-05-24 ENCOUNTER — Encounter: Payer: Self-pay | Admitting: Obstetrics and Gynecology

## 2021-05-24 ENCOUNTER — Other Ambulatory Visit: Payer: Self-pay

## 2021-05-24 VITALS — BP 123/87 | HR 93 | Ht 67.0 in | Wt 223.9 lb

## 2021-05-24 DIAGNOSIS — Z7689 Persons encountering health services in other specified circumstances: Secondary | ICD-10-CM

## 2021-05-24 DIAGNOSIS — E669 Obesity, unspecified: Secondary | ICD-10-CM

## 2021-05-24 MED ORDER — PHENTERMINE HCL 37.5 MG PO CAPS: 37.5000 mg | ORAL_CAPSULE | ORAL | 1 refills | Status: DC

## 2021-05-24 NOTE — Progress Notes (Signed)
  Pt present for weight check and management. Pt's last visit 04/28/2021 weight was 226 lb and BMI 35.51. Today's visit BMI 35.07;waist 49 inches and weight 223.9lb.  Pt currently not taking Topamax due to having headaches. Pt lost bottle of phentermine and missed several doses of the medication.

## 2021-05-24 NOTE — Progress Notes (Signed)
GYNECOLOGY PROGRESS NOTE  Subjective:    Patient ID: Chelsea Hudson, female    DOB: [birth year], 55 y.o.   MRN: [id number]  HPI  Patient is a 55 y.o. G0P0 female who presents for 2 month weight management follow up. She has utilized Phentermine for the past 7.5 months. Also on Topamax (for the past 4 months) to help increase weight loss efforts. Reports that last month she lost her pill bottle, and has not been on the Phentermine for ~ 1 month.  She also reports side effects from the increased dose of Topamax (changed from 25 to 50 mg last visit).   Starting weight at onset of weight loss journey was 255 lbs.  Desires to get to 200 lbs     Current interventions:  1. Diet - Has been trying to maintain an overall healthy diet. Eats breakfast and dinner, skips lunch.  Doing well with water intake   2. Activity - Zumba classes every other day.   3. Reports bowel movements are normal.    The following portions of the patient's history were reviewed and updated as appropriate: allergies, current medications, past family history, past medical history, past social history, past surgical history and problem list.   Review of Systems Pertinent items noted in HPI and remainder of comprehensive ROS otherwise negative.   Objective:    Vitals with BMI 05/24/2021 05/03/2021 04/28/2021  Height 5\' 7"  - 5\' 7"   Weight 223 lbs 14 oz 220 lbs 11 oz 226 lbs 11 oz  BMI 35.06 44.81 85.6  Systolic 314 970 263  Diastolic 87 83 83  Pulse 93 82 97    General appearance: alert and no distress Abdomen: soft, non-tender.  Waist circumference 49 in.    Labs:  Office Visit on 05/03/2021  Component Date Value Ref Range Status   Folate 05/03/2021 7.9  >5.9 ng/mL Final   Performed at [facility], [address] [place], [place] [address]   Vitamin B-12 05/03/2021 237  180 - 914 pg/mL Final   Comment: (NOTE) This assay is not validated for testing neonatal or myeloproliferative syndrome  specimens for Vitamin B12 levels. Performed at [facility], [place] [address] [place], [place] [address]    Iron 05/03/2021 34  28 - 170 ug/dL Final   TIBC 05/03/2021 297  250 - 450 ug/dL Final   Saturation Ratios 05/03/2021 12  10.4 - 31.8 % Final   UIBC 05/03/2021 263  ug/dL Final   Performed at [facility], [place], [place] [address]   Ferritin 05/03/2021 74  11 - 307 ng/mL Final   Performed at [facility], [address] [place], [place] [address]     Assessment:   Weight management Obesity, Body mass index is 35.07 kg/m.  DM  Essential HTN  H/o breast cancer  Plan:   Patient with only 3 lb weight loss total in last 2 months. Lost her medication (Phentermine) last month. Strongly encouraged patient to regard medication with extra care as she can only have 1 refill per month. Also cautioned that after next visit, she my need to pause Phentermine use due to risk of plateau with weight loss. For now, can continue Phentermine and Topamax. Continue to encourage strict dietary modification and exercise.  DM and HTN, currently managed by PCP.  Lipid panel and A1c improving. HTN well controlled, tolerating Phentermine.  3. H/o breast cancer, s/p recent visit with Oncology last month.  F/u in 2 months for annual exam and next weight check.      Rubie Maid, MD [facility]

## 2021-06-06 ENCOUNTER — Telehealth: Payer: Self-pay | Admitting: Internal Medicine

## 2021-06-06 NOTE — Telephone Encounter (Signed)
Patient called, left VM to return the call to the office to speak to a nurse. Unsure why patient called initially, poor connection as noted below.

## 2021-06-06 NOTE — Telephone Encounter (Signed)
Copied from [place] [id number]. Topic: General - Other >> Jun 06, 2021  3:49 PM Chelsea Hudson wrote: Reason for CRM: Pt stated she needs a nurse to return her call. Phone was breaking up and could barely hear what pt was saying. Pt requests that a nurse return her call.

## 2021-06-07 ENCOUNTER — Other Ambulatory Visit: Payer: Self-pay

## 2021-06-07 DIAGNOSIS — E1165 Type 2 diabetes mellitus with hyperglycemia: Secondary | ICD-10-CM

## 2021-06-07 DIAGNOSIS — I10 Essential (primary) hypertension: Secondary | ICD-10-CM

## 2021-06-07 MED ORDER — TRIAMTERENE-HCTZ 37.5-25 MG PO TABS: 1.0000 | ORAL_TABLET | Freq: Every day | ORAL | 1 refills | Status: DC

## 2021-06-07 MED ORDER — METFORMIN HCL 500 MG PO TABS: ORAL_TABLET | ORAL | 1 refills | Status: DC

## 2021-06-09 ENCOUNTER — Encounter: Payer: Self-pay | Admitting: Internal Medicine

## 2021-06-09 ENCOUNTER — Ambulatory Visit: Payer: Medicare Other | Admitting: Internal Medicine

## 2021-06-09 ENCOUNTER — Other Ambulatory Visit: Payer: Self-pay

## 2021-06-09 VITALS — BP 142/74 | HR 89 | Temp 97.5°F | Resp 17 | Ht 67.0 in | Wt 226.2 lb

## 2021-06-09 DIAGNOSIS — E66811 Obesity, class 1: Secondary | ICD-10-CM | POA: Insufficient documentation

## 2021-06-09 DIAGNOSIS — E6609 Other obesity due to excess calories: Secondary | ICD-10-CM | POA: Insufficient documentation

## 2021-06-09 DIAGNOSIS — I700 Atherosclerosis of aorta: Secondary | ICD-10-CM | POA: Insufficient documentation

## 2021-06-09 DIAGNOSIS — E785 Hyperlipidemia, unspecified: Secondary | ICD-10-CM

## 2021-06-09 DIAGNOSIS — I10 Essential (primary) hypertension: Secondary | ICD-10-CM | POA: Diagnosis not present

## 2021-06-09 DIAGNOSIS — D508 Other iron deficiency anemias: Secondary | ICD-10-CM

## 2021-06-09 DIAGNOSIS — M1990 Unspecified osteoarthritis, unspecified site: Secondary | ICD-10-CM | POA: Insufficient documentation

## 2021-06-09 DIAGNOSIS — D649 Anemia, unspecified: Secondary | ICD-10-CM | POA: Insufficient documentation

## 2021-06-09 DIAGNOSIS — M170 Bilateral primary osteoarthritis of knee: Secondary | ICD-10-CM

## 2021-06-09 DIAGNOSIS — G620 Drug-induced polyneuropathy: Secondary | ICD-10-CM

## 2021-06-09 DIAGNOSIS — E1169 Type 2 diabetes mellitus with other specified complication: Secondary | ICD-10-CM | POA: Diagnosis not present

## 2021-06-09 DIAGNOSIS — Z853 Personal history of malignant neoplasm of breast: Secondary | ICD-10-CM

## 2021-06-09 DIAGNOSIS — D631 Anemia in chronic kidney disease: Secondary | ICD-10-CM | POA: Insufficient documentation

## 2021-06-09 DIAGNOSIS — E1165 Type 2 diabetes mellitus with hyperglycemia: Secondary | ICD-10-CM

## 2021-06-09 DIAGNOSIS — Z6835 Body mass index (BMI) 35.0-35.9, adult: Secondary | ICD-10-CM

## 2021-06-09 NOTE — Assessment & Plan Note (Signed)
A1C and urine microlabumin today Encouraged her to consume a low carb diet and exercise for weight loss Continue Metformin Referral to opthalomology for eye exam Referral to podiatry for foot exam She declines all vaccines

## 2021-06-09 NOTE — Assessment & Plan Note (Signed)
Continue Gabapentin

## 2021-06-09 NOTE — Assessment & Plan Note (Signed)
Will monitor CBC

## 2021-06-09 NOTE — Progress Notes (Signed)
Subjective:    Patient ID: Chelsea Hudson, female    DOB: 1966/07/02, 55 y.o.   MRN: [id number]  HPI  Patient presents to clinic today for follow-up of chronic conditions.  She is establishing care with me today, transferring care from [facility], NP.  HTN: Her BP today is 142/74.  She has been out of her triamterene HCT x3 days.  ECG from 12/2017 reviewed.  OA: Mainly in her knees.  She takes pain relief medication OTC as needed with good relief of symptoms.  History of Right Breast Cancer: s/p mastectomy, lymph node excision, chemo and radiation.  She is taking Femara as prescribed.  She follows with oncology.  Chemotherapy Induced Neuropathy: Managed with Gabapentin with good relief of symptoms.  DM 2: Her last A1c was 6.4%, 02/2021.  She is taking metformin as prescribed.  She does not check her sugars.  She checks her feet routinely.  Her last eye exam was more than 1 year ago.  She declines flu, Pneumovax and COVID vaccines  HLD: Her last LDL was 109, triglycerides 147,/2022.  She is not currently taking any cholesterol-lowering medication at this time.  She does not consume a low-fat diet.  Anemia: Her last H/H was 11/34.7, 04/2021.  She is not currently taking any oral iron at this time.  She does not follow with hematology.  Review of Systems     Past Medical History:  Diagnosis Date   Arthritis    SHOULDER   Breast cancer ([place]) 02/2017   rt breast   Breast cancer ([place]) 2018   Cancer ([place]) 02/28/2017   INVASIVE MAMMARY CARCINOMA WITH MUCINOUS FEATURES.   Colonic diverticular abscess 06/21/2017   Colonoscopy 06/25/2017: No evidence of malignancy.   Diabetes mellitus without complication ([place])    Hypertension    Irregular heart beat    PT STATES IT "SKIPS A BEAT"    Obesity    Personal history of chemotherapy    Personal history of radiation therapy     Current Outpatient Medications  Medication Sig Dispense Refill   gabapentin (NEURONTIN) 400 MG capsule Take 1  capsule (400 mg total) by mouth at bedtime. 90 capsule 1   letrozole (FEMARA) 2.5 MG tablet Take 1 tablet (2.5 mg total) by mouth daily. 90 tablet 3   metFORMIN (GLUCOPHAGE) 500 MG tablet TAKE 2 TABLETS BY MOUTH ONCE DAILY WITH BREAKFAST AND 1 WITH SUPPER 270 tablet 1   phentermine 37.5 MG capsule Take 1 capsule (37.5 mg total) by mouth every morning. 30 capsule 1   potassium chloride (KLOR-CON) 10 MEQ tablet Take 1 tablet (10 mEq total) by mouth 2 (two) times daily. 180 tablet 1   topiramate (TOPAMAX) 50 MG tablet Take 1 tablet (50 mg total) by mouth daily. (Patient not taking: Reported on 05/24/2021) 90 tablet 0   triamterene-hydrochlorothiazide (MAXZIDE-25) 37.5-25 MG tablet Take 1 tablet by mouth daily. 90 tablet 1   No current facility-administered medications for this visit.    Allergies  Allergen Reactions   Other Hives and Itching    [name]     Family History  Problem Relation Age of Onset   Diabetes Father    Stroke Father    Hypertension Father    Hypertension Mother    Brain cancer Maternal Aunt 54   Diabetes Sister    Colon cancer Neg Hx    Breast  cancer Neg Hx     Social History   Socioeconomic History   Marital status: Married    Spouse name: Not on file   Number of children: Not on file   Years of education: Not on file   Highest education level: Not on file  Occupational History   Not on file  Tobacco Use   Smoking status: Former    Packs/day: 0.25    Years: 25.00    Pack years: 6.25    Types: Cigarettes    Quit date: 10/20/2017    Years since quitting: 3.6   Smokeless tobacco: Former  Scientific laboratory technician Use: Never used  Substance and Sexual Activity   Alcohol use: Not Currently    Comment: occas   Drug use: No   Sexual activity: Not Currently  Other Topics Concern   Not on file  Social History Narrative   Not on file   Social Determinants of  Health   Financial Resource Strain: Not on file  Food Insecurity: Not on file  Transportation Needs: Not on file  Physical Activity: Not on file  Stress: Not on file  Social Connections: Not on file  Intimate Partner Violence: Not on file     Constitutional: Denies fever, malaise, fatigue, headache or abrupt weight changes.  HEENT: Denies eye pain, eye redness, ear pain, ringing in the ears, wax buildup, runny nose, nasal congestion, bloody nose, or sore throat. Respiratory: Denies difficulty breathing, shortness of breath, cough or sputum production.   Cardiovascular: Denies chest pain, chest tightness, palpitations or swelling in the hands or feet.  Gastrointestinal: Denies abdominal pain, bloating, constipation, diarrhea or blood in the stool.  GU: Denies urgency, frequency, pain with urination, burning sensation, blood in urine, odor or discharge. Musculoskeletal: Patient reports intermittent knee pain.  Denies decrease in range of motion, difficulty with gait, muscle pain or joint swelling.  Skin: Patient reports fungal toenails bilaterally.  Denies redness, rashes, lesions or ulcercations.  Neurological: Patient reports neuropathic pain in feet.  Denies dizziness, difficulty with memory, difficulty with speech or problems with balance and coordination.  Psych: Denies anxiety, depression, SI/HI.  No other specific complaints in a complete review of systems (except as listed in HPI above).  Objective:   Physical Exam   BP (!) 142/74 (BP Location: Left Arm, Patient Position: Sitting, Cuff Size: Large)   Pulse 89   Temp (!) 97.5 F (36.4 C) (Temporal)   Resp 17   Ht '5\' 7"'$  (1.702 m)   Wt 226 lb 3.2 oz (102.6 kg)   LMP 01/31/2014 (Approximate) Comment: LMP was 3 years ago.  SpO2 100%   BMI 35.43 kg/m   Wt Readings from Last 3 Encounters:  05/24/21 223 lb 14.4 oz (101.6 kg)  05/03/21 220 lb 10.9 oz (100.1 kg)  04/28/21 226 lb 11.2 oz (102.8 kg)    General: Appears her  stated age, obese, in NAD. Skin: Warm, dry and intact. No ulcerations noted.  Fungal toenails noted bilaterally. HEENT: Head: normal shape and size; Eyes: sclera white and EOMs intact;  Cardiovascular: Normal rate and rhythm. S1,S2 noted.  No murmur, rubs or gallops noted.  Trace pitting BLE edema. No carotid bruits noted. Pulmonary/Chest: Normal effort and positive vesicular breath sounds. No respiratory distress. No wheezes, rales or ronchi noted.  Musculoskeletal: No difficulty with gait.  Neurological: Alert and oriented.  Psychiatric: Mood and affect normal. Behavior is normal. Judgment and thought content normal.     BMET  Component Value Date/Time   NA 135 05/03/2021 1406   K 3.5 05/03/2021 1406   CL 103 05/03/2021 1406   CO2 25 05/03/2021 1406   GLUCOSE 118 (H) 05/03/2021 1406   BUN 16 05/03/2021 1406   CREATININE 0.92 05/03/2021 1406   CREATININE 0.87 03/22/2020 0802   CALCIUM 9.3 05/03/2021 1406   GFRNONAA >60 05/03/2021 1406   GFRNONAA 77 03/22/2020 0802   GFRAA 89 03/22/2020 0802    Lipid Panel     Component Value Date/Time   CHOL 168 03/01/2021 1649   TRIG 147 03/01/2021 1649   HDL 33 (L) 03/01/2021 1649   CHOLHDL 5.1 (H) 03/01/2021 1649   CHOLHDL 4.1 02/27/2020 0849   VLDL 23 04/04/2017 0941   LDLCALC 109 (H) 03/01/2021 1649   LDLCALC 96 02/27/2020 0849    CBC    Component Value Date/Time   WBC 7.7 05/03/2021 1406   RBC 4.28 05/03/2021 1406   HGB 11.0 (L) 05/03/2021 1406   HCT 34.7 (L) 05/03/2021 1406   PLT 390 05/03/2021 1406   MCV 81.1 05/03/2021 1406   MCH 25.7 (L) 05/03/2021 1406   MCHC 31.7 05/03/2021 1406   RDW 17.1 (H) 05/03/2021 1406   LYMPHSABS 1.9 05/03/2021 1406   MONOABS 0.3 05/03/2021 1406   EOSABS 0.1 05/03/2021 1406   BASOSABS 0.0 05/03/2021 1406    Hgb A1C Lab Results  Component Value Date   HGBA1C 6.4 (H) 03/01/2021           Assessment & Plan:    Webb Silversmith, NP This visit occurred during the SARS-CoV-2  public health emergency.  Safety protocols were in place, including screening questions prior to the visit, additional usage of staff PPE, and extensive cleaning of exam room while observing appropriate contact time as indicated for disinfecting solutions.

## 2021-06-09 NOTE — Assessment & Plan Note (Signed)
Encouraged regular physical activity Encouraged weight loss as this can help reduce joint pain Ok to continue pain relievers OTC

## 2021-06-09 NOTE — Patient Instructions (Signed)
https://www.diabeteseducator.org/docs/default-source/living-with-diabetes/conquering-the-grocery-store-v1.pdf?sfvrsn=4">  Carbohydrate Counting for Diabetes Mellitus, Adult Carbohydrate counting is a method of keeping track of how many carbohydrates you eat. Eating carbohydrates naturally increases the amount of sugar (glucose) in the blood. Counting how many carbohydrates you eat improves your bloodglucose control, which helps you manage your diabetes. It is important to know how many carbohydrates you can safely have in each meal. This is different for every person. A dietitian can help you make a meal plan and calculate how many carbohydrates you should have at each meal andsnack. What foods contain carbohydrates? Carbohydrates are found in the following foods: Grains, such as breads and cereals. Dried beans and soy products. Starchy vegetables, such as potatoes, peas, and corn. Fruit and fruit juices. Milk and yogurt. Sweets and snack foods, such as cake, cookies, candy, chips, and soft drinks. How do I count carbohydrates in foods? There are two ways to count carbohydrates in food. You can read food labels or learn standard serving sizes of foods. You can use either of the methods or acombination of both. Using the Nutrition Facts label The Nutrition Facts list is included on the labels of almost all packaged foods and beverages in the [place] It includes: The serving size. Information about nutrients in each serving, including the grams (g) of carbohydrate per serving. To use the Nutrition Facts: Decide how many servings you will have. Multiply the number of servings by the number of carbohydrates per serving. The resulting number is the total amount of carbohydrates that you will be having. Learning the standard serving sizes of foods When you eat carbohydrate foods that are not packaged or do not include Nutrition Facts on the label, you need to measure the servings in order to count the  amount of carbohydrates. Measure the foods that you will eat with a food scale or measuring cup, if needed. Decide how many standard-size servings you will eat. Multiply the number of servings by 15. For foods that contain carbohydrates, one serving equals 15 g of carbohydrates. For example, if you eat 2 cups or 10 oz (300 g) of strawberries, you will have eaten 2 servings and 30 g of carbohydrates (2 servings x 15 g = 30 g). For foods that have more than one food mixed, such as soups and casseroles, you must count the carbohydrates in each food that is included. The following list contains standard serving sizes of common carbohydrate-rich foods. Each of these servings has about 15 g of carbohydrates: 1 slice of bread. 1 six-inch (15 cm) tortilla. ? cup or 2 oz (53 g) cooked rice or pasta.  cup or 3 oz (85 g) cooked or canned, drained and rinsed beans or lentils.  cup or 3 oz (85 g) starchy vegetable, such as peas, corn, or squash.  cup or 4 oz (120 g) hot cereal.  cup or 3 oz (85 g) boiled or mashed potatoes, or  or 3 oz (85 g) of a large baked potato.  cup or 4 fl oz (118 mL) fruit juice. 1 cup or 8 fl oz (237 mL) milk. 1 small or 4 oz (106 g) apple.  or 2 oz (63 g) of a medium banana. 1 cup or 5 oz (150 g) strawberries. 3 cups or 1 oz (24 g) popped popcorn. What is an example of carbohydrate counting? To calculate the number of carbohydrates in this sample meal, follow the [place] below. Sample meal 3 oz (85 g) chicken breast. ? cup or 4 oz (106 g) brown rice.    cup or 3 oz (85 g) corn. 1 cup or 8 fl oz (237 mL) milk. 1 cup or 5 oz (150 g) strawberries with sugar-free whipped topping. Carbohydrate calculation Identify the foods that contain carbohydrates: Rice. Corn. Milk. Strawberries. Calculate how many servings you have of each food: 2 servings rice. 1 serving corn. 1 serving milk. 1 serving strawberries. Multiply each number of servings by 15 g: 2 servings  rice x 15 g = 30 g. 1 serving corn x 15 g = 15 g. 1 serving milk x 15 g = 15 g. 1 serving strawberries x 15 g = 15 g. Add together all of the amounts to find the total grams of carbohydrates eaten: 30 g + 15 g + 15 g + 15 g = 75 g of carbohydrates total. What are tips for following this plan? Shopping Develop a meal plan and then make a shopping list. Buy fresh and frozen vegetables, fresh and frozen fruit, dairy, eggs, beans, lentils, and whole grains. Look at food labels. Choose foods that have more fiber and less sugar. Avoid processed foods and foods with added sugars. Meal planning Aim to have the same amount of carbohydrates at each meal and for each snack time. Plan to have regular, balanced meals and snacks. Where to find more information American Diabetes Association: www.diabetes.org Centers for Disease Control and Prevention: www.cdc.gov Summary Carbohydrate counting is a method of keeping track of how many carbohydrates you eat. Eating carbohydrates naturally increases the amount of sugar (glucose) in the blood. Counting how many carbohydrates you eat improves your blood glucose control, which helps you manage your diabetes. A dietitian can help you make a meal plan and calculate how many carbohydrates you should have at each meal and snack. This information is not intended to replace advice given to you by your health care provider. Make sure you discuss any questions you have with your healthcare provider. Document Revised: 11/06/2019 Document Reviewed: 11/07/2019 Elsevier Patient Education  2021 Elsevier Inc.  

## 2021-06-09 NOTE — Assessment & Plan Note (Signed)
Encouraged diet and exercise for weight loss ?

## 2021-06-09 NOTE — Assessment & Plan Note (Signed)
In remission, on Femara She will continue to see oncology, will follow

## 2021-06-09 NOTE — Assessment & Plan Note (Signed)
Lipid profile reviewed Encouraged her to consume a low fat diet Consider statin therapy pending labs

## 2021-06-09 NOTE — Assessment & Plan Note (Signed)
Uncontrolled off meds Triamterene HCT refilled today Reinforced DASH diet and exercise for weight loss Kidney function reviewed

## 2021-06-10 LAB — LIPID PANEL
Cholesterol: 165 mg/dL (ref <200)
HDL: 35 mg/dL — ABNORMAL LOW (ref >=50)
LDL Cholesterol (Calc): 111 mg/dL (calc) — ABNORMAL HIGH
Non-HDL Cholesterol (Calc): 130 mg/dL (calc) — ABNORMAL HIGH (ref <130)
Total CHOL/HDL Ratio: 4.7 (calc) (ref <5.0)
Triglycerides: 90 mg/dL (ref <150)

## 2021-06-10 LAB — HEMOGLOBIN A1C
Hgb A1c MFr Bld: 6.2 % of total Hgb — ABNORMAL HIGH (ref <5.7)
Mean Plasma Glucose: 131 mg/dL
eAG (mmol/L): 7.3 mmol/L

## 2021-06-10 LAB — MICROALBUMIN / CREATININE URINE RATIO
Creatinine, Urine: 200 mg/dL (ref 20–275)
Microalb Creat Ratio: 34 mcg/mg creat — ABNORMAL HIGH (ref <30)
Microalb, Ur: 6.8 mg/dL

## 2021-06-14 ENCOUNTER — Other Ambulatory Visit: Payer: Self-pay

## 2021-06-14 DIAGNOSIS — E785 Hyperlipidemia, unspecified: Secondary | ICD-10-CM

## 2021-06-14 DIAGNOSIS — E1169 Type 2 diabetes mellitus with other specified complication: Secondary | ICD-10-CM

## 2021-06-14 DIAGNOSIS — E1165 Type 2 diabetes mellitus with hyperglycemia: Secondary | ICD-10-CM

## 2021-06-15 MED ORDER — ATORVASTATIN CALCIUM 10 MG PO TABS
10.0000 mg | ORAL_TABLET | Freq: Every day | ORAL | 2 refills | Status: DC
Start: 2021-06-15 — End: 2021-07-21

## 2021-06-15 MED ORDER — LISINOPRIL 5 MG PO TABS
5.0000 mg | ORAL_TABLET | Freq: Every day | ORAL | 2 refills | Status: DC
Start: 2021-06-15 — End: 2021-07-21

## 2021-07-06 ENCOUNTER — Other Ambulatory Visit: Payer: Self-pay

## 2021-07-06 DIAGNOSIS — T451X5A Adverse effect of antineoplastic and immunosuppressive drugs, initial encounter: Secondary | ICD-10-CM

## 2021-07-06 DIAGNOSIS — G620 Drug-induced polyneuropathy: Secondary | ICD-10-CM

## 2021-07-06 NOTE — Telephone Encounter (Signed)
Copied from [place] [id number]. Topic: Quick Communication - Rx Refill/Question >> Jul 05, 2021 10:42 AM Pawlus, Brayton Layman A wrote: Pt wanted to know if a refill could be provided for gabapentin (NEURONTIN) 400 MG capsule, last refill was 09/2020 by Cyndia Skeeters, please advise.

## 2021-07-07 MED ORDER — GABAPENTIN 400 MG PO CAPS: 400.0000 mg | ORAL_CAPSULE | Freq: Every day | ORAL | 1 refills | Status: DC

## 2021-07-21 ENCOUNTER — Other Ambulatory Visit: Payer: Self-pay

## 2021-07-21 DIAGNOSIS — I10 Essential (primary) hypertension: Secondary | ICD-10-CM

## 2021-07-21 DIAGNOSIS — E1165 Type 2 diabetes mellitus with hyperglycemia: Secondary | ICD-10-CM

## 2021-07-21 DIAGNOSIS — E1169 Type 2 diabetes mellitus with other specified complication: Secondary | ICD-10-CM

## 2021-07-21 MED ORDER — METFORMIN HCL 500 MG PO TABS: ORAL_TABLET | ORAL | 1 refills | Status: DC

## 2021-07-21 MED ORDER — ATORVASTATIN CALCIUM 10 MG PO TABS: 10.0000 mg | ORAL_TABLET | Freq: Every day | ORAL | 1 refills | Status: DC

## 2021-07-21 MED ORDER — TRIAMTERENE-HCTZ 37.5-25 MG PO TABS: 1.0000 | ORAL_TABLET | Freq: Every day | ORAL | 1 refills | Status: DC

## 2021-07-21 MED ORDER — LISINOPRIL 5 MG PO TABS
5.0000 mg | ORAL_TABLET | Freq: Every day | ORAL | 1 refills | Status: DC
Start: 2021-07-21 — End: 2021-11-16

## 2021-07-26 ENCOUNTER — Other Ambulatory Visit: Payer: Self-pay

## 2021-07-26 ENCOUNTER — Encounter: Payer: Self-pay | Admitting: Obstetrics and Gynecology

## 2021-07-26 ENCOUNTER — Ambulatory Visit (INDEPENDENT_AMBULATORY_CARE_PROVIDER_SITE_OTHER): Payer: Medicare HMO | Admitting: Obstetrics and Gynecology

## 2021-07-26 VITALS — BP 104/72 | HR 111 | Ht 67.0 in | Wt 212.9 lb

## 2021-07-26 DIAGNOSIS — I10 Essential (primary) hypertension: Secondary | ICD-10-CM

## 2021-07-26 DIAGNOSIS — Z853 Personal history of malignant neoplasm of breast: Secondary | ICD-10-CM | POA: Diagnosis not present

## 2021-07-26 DIAGNOSIS — E669 Obesity, unspecified: Secondary | ICD-10-CM

## 2021-07-26 DIAGNOSIS — E1165 Type 2 diabetes mellitus with hyperglycemia: Secondary | ICD-10-CM

## 2021-07-26 DIAGNOSIS — Z7689 Persons encountering health services in other specified circumstances: Secondary | ICD-10-CM | POA: Diagnosis not present

## 2021-07-26 NOTE — Progress Notes (Signed)
     GYNECOLOGY PROGRESS NOTE  Subjective:    Patient ID: Chelsea Hudson, female    DOB: [birth year], 55 y.o.   MRN: [id number]  HPI  Patient is a 55 y.o. G0P0 female who presents for 2 month weight management follow up. She has utilized Phentermine for the past 7.5 months. Also on Topamax (for the past 4 months) to help increase weight loss efforts. Resumed Phentermine after being off for 1 month as she lost her pill bottle. She denies side effects from the medications.  Starting weight at onset of weight loss journey was 255 lbs.  Desires to get to 200 lbs.   Reports that her husband has been in and out of the hospital for the past few weeks.  Has been stressful on her.  Reports that her husband is not really eating. She is trying creative ways to cook to help him build an appetite.     Current interventions:  1. Diet - Has been trying to maintain an overall healthy diet. Eats breakfast and dinner, skips lunch.  Doing well with water intake   2. Activity - tries to do Zumba classes, also does exercise videos at home while cooking.   3. Reports bowel movements are normal.    The following portions of the patient's history were reviewed and updated as appropriate: allergies, current medications, past family history, past medical history, past social history, past surgical history and problem list.   Review of Systems Pertinent items noted in HPI and remainder of comprehensive ROS otherwise negative.   Objective:    Vitals with BMI 07/26/2021 06/09/2021 05/24/2021  Height '5\' 7"'$  '5\' 7"'$  '5\' 7"'$   Weight 212 lbs 14 oz 226 lbs 3 oz 223 lbs 14 oz  BMI 33.34 [id number]  Systolic [id number]  Diastolic 72 74 87  Pulse [id number] 89 93    General appearance: alert and no distress Abdomen: soft, non-tender.  Waist circumference 46 3/4 in.    Labs:  No new labs   Assessment:   Weight management Obesity, Body mass index is 33.34 kg/m.  DM  Essential HTN  H/o breast cancer  Plan:    Patient with better weight loss this month.  For now, can continue Phentermine and Topamax. Continue to encourage strict dietary modification and exercise.  DM and HTN, currently managed by PCP.  No elevated BPs noted, can continue use of the Phentermine. Would encourage starting a fish oil supplement due to low HDL.  3. H/o breast cancer, currently on Letrazole.    F/u in 2 months for weight check. Also needs to be rescheduled for annual exam (missed last appointment).    Rubie Maid, MD [facility]

## 2021-07-29 ENCOUNTER — Other Ambulatory Visit: Payer: Self-pay

## 2021-08-01 ENCOUNTER — Other Ambulatory Visit: Payer: Self-pay | Admitting: Internal Medicine

## 2021-08-01 DIAGNOSIS — E876 Hypokalemia: Secondary | ICD-10-CM

## 2021-08-01 NOTE — Telephone Encounter (Signed)
Medication Refill - Medication: Potasium   Has the patient contacted their pharmacy? Yes.  She said they told her to call the office (Agent: If no, request that the patient contact the pharmacy for the refill.) (Agent: If yes, when and what did the pharmacy advise?)  Preferred Pharmacy (with phone number or street name): [facility]  [address]  Agent: Please be advised that RX refills may take up to 3 business days. We ask that you follow-up with your pharmacy.

## 2021-08-02 MED ORDER — POTASSIUM CHLORIDE ER 10 MEQ PO TBCR: 10.0000 meq | EXTENDED_RELEASE_TABLET | Freq: Two times a day (BID) | ORAL | 1 refills | Status: DC

## 2021-09-13 ENCOUNTER — Ambulatory Visit: Payer: Self-pay | Admitting: Internal Medicine

## 2021-09-14 ENCOUNTER — Other Ambulatory Visit: Payer: Self-pay | Admitting: Obstetrics and Gynecology

## 2021-09-14 MED ORDER — TOPIRAMATE 50 MG PO TABS: 50.0000 mg | ORAL_TABLET | Freq: Every day | ORAL | 3 refills | Status: DC

## 2021-09-19 ENCOUNTER — Encounter: Payer: Self-pay | Admitting: Internal Medicine

## 2021-09-19 ENCOUNTER — Ambulatory Visit (INDEPENDENT_AMBULATORY_CARE_PROVIDER_SITE_OTHER): Payer: Medicare HMO | Admitting: Internal Medicine

## 2021-09-19 ENCOUNTER — Other Ambulatory Visit: Payer: Self-pay

## 2021-09-19 VITALS — BP 138/68 | HR 83 | Temp 97.1°F | Resp 18 | Ht 67.0 in | Wt 221.8 lb

## 2021-09-19 DIAGNOSIS — E1165 Type 2 diabetes mellitus with hyperglycemia: Secondary | ICD-10-CM

## 2021-09-19 DIAGNOSIS — E785 Hyperlipidemia, unspecified: Secondary | ICD-10-CM | POA: Diagnosis not present

## 2021-09-19 DIAGNOSIS — Z6835 Body mass index (BMI) 35.0-35.9, adult: Secondary | ICD-10-CM | POA: Diagnosis not present

## 2021-09-19 DIAGNOSIS — G620 Drug-induced polyneuropathy: Secondary | ICD-10-CM | POA: Diagnosis not present

## 2021-09-19 DIAGNOSIS — E1169 Type 2 diabetes mellitus with other specified complication: Secondary | ICD-10-CM

## 2021-09-19 DIAGNOSIS — I10 Essential (primary) hypertension: Secondary | ICD-10-CM

## 2021-09-19 NOTE — Assessment & Plan Note (Signed)
Continue Gabapentin

## 2021-09-19 NOTE — Assessment & Plan Note (Signed)
A1C today No urine microalbumin due to ACEI therapy Encouraged her to consume a low carb diet and exercise for weight loss Continue Metformin, Gabapentin and Lisinopril. Encouraged routine eye exam Encouraged routine foot exams She declines immuniaztions

## 2021-09-19 NOTE — Progress Notes (Signed)
Subjective:    Patient ID: Chelsea Hudson, female    DOB: May 07, 1966, 55 y.o.   MRN: [id number]  HPI  Pt presents to the clinic today for follow up of HTN, HLD and DM 2.  HTN: Her BP today is 142/72. She is taking Triamterene HCT and Lisinopril as prescribed. ECG from 12/2017 reviewed.  HLD: Her last LDL was 111, triglycerides 90, 05/2021. She denies myalgias on Atorvastatin. She tries to consume a low fat diet.  DM 2 with Peripheral Neuropathy: Her last A1C 6.2%, 05/2021. She is taking Metformin and Gabapentin as prescribed. She reports worsening tinging in her right hand.  She is on Lisinopril for renal protection. She does not check her sugars. Her last eye exam was more than 1 year ago. Flu never. Pneumovax never. Covid never.  Review of Systems  Past Medical History:  Diagnosis Date   Arthritis    SHOULDER   Breast cancer ([place]) 02/2017   rt breast   Breast cancer ([place]) 2018   Cancer ([place]) 02/28/2017   INVASIVE MAMMARY CARCINOMA WITH MUCINOUS FEATURES.   Colonic diverticular abscess 06/21/2017   Colonoscopy 06/25/2017: No evidence of malignancy.   Diabetes mellitus without complication ([place])    Hypertension    Irregular heart beat    PT STATES IT "SKIPS A BEAT"    Obesity    Personal history of chemotherapy    Personal history of radiation therapy     Current Outpatient Medications  Medication Sig Dispense Refill   atorvastatin (LIPITOR) 10 MG tablet Take 1 tablet (10 mg total) by mouth daily. 90 tablet 1   gabapentin (NEURONTIN) 400 MG capsule Take 1 capsule (400 mg total) by mouth at bedtime. 90 capsule 1   letrozole (FEMARA) 2.5 MG tablet Take 1 tablet (2.5 mg total) by mouth daily. 90 tablet 3   lisinopril (ZESTRIL) 5 MG tablet Take 1 tablet (5 mg total) by mouth daily. 90 tablet 1   metFORMIN (GLUCOPHAGE) 500 MG tablet TAKE 2 TABLETS BY MOUTH ONCE DAILY WITH BREAKFAST AND 1 WITH SUPPER 270 tablet 1   phentermine 37.5 MG capsule Take 1 capsule (37.5 mg total) by  mouth every morning. 30 capsule 1   potassium chloride (KLOR-CON) 10 MEQ tablet Take 1 tablet (10 mEq total) by mouth 2 (two) times daily. 180 tablet 1   topiramate (TOPAMAX) 50 MG tablet Take 1 tablet (50 mg total) by mouth daily. 90 tablet 3   triamterene-hydrochlorothiazide (MAXZIDE-25) 37.5-25 MG tablet Take 1 tablet by mouth daily. 90 tablet 1   No current facility-administered medications for this visit.    Allergies  Allergen Reactions   Other Hives and Itching    [name]     Family History  Problem Relation Age of Onset   Diabetes Father    Stroke Father    Hypertension Father    Hypertension Mother    Brain cancer Maternal Aunt 64   Diabetes Sister    Colon cancer Neg Hx    Breast cancer Neg Hx     Social History   Socioeconomic History   Marital status: Married    Spouse name: Not on file   Number of children: Not on file   Years of education: Not on file   Highest education level: Not on file  Occupational History   Not on file  Tobacco Use   Smoking status:  Former    Packs/day: 0.25    Years: 25.00    Pack years: 6.25    Types: Cigarettes    Quit date: 10/20/2017    Years since quitting: 3.9   Smokeless tobacco: Former  Scientific laboratory technician Use: Never used  Substance and Sexual Activity   Alcohol use: Not Currently    Comment: occas   Drug use: No   Sexual activity: Not Currently  Other Topics Concern   Not on file  Social History Narrative   Not on file   Social Determinants of Health   Financial Resource Strain: Not on file  Food Insecurity: Not on file  Transportation Needs: Not on file  Physical Activity: Not on file  Stress: Not on file  Social Connections: Not on file  Intimate Partner Violence: Not on file     Constitutional: Denies fever, malaise, fatigue, headache or abrupt weight changes.  Respiratory: Denies difficulty  breathing, shortness of breath, cough or sputum production.   Cardiovascular: Denies chest pain, chest tightness, palpitations or swelling in the hands or feet.  Gastrointestinal: Denies abdominal pain, bloating, constipation, diarrhea or blood in the stool.  GU: Denies urgency, frequency, pain with urination, burning sensation, blood in urine, odor or discharge. Musculoskeletal: Denies decrease in range of motion, difficulty with gait, muscle pain or joint pain and swelling.  Skin: Denies redness, rashes, lesions or ulcercations.  Neurological: Pt reports peripheral neuropathy. Denies dizziness, difficulty with memory, difficulty with speech or problems with balance and coordination.    No other specific complaints in a complete review of systems (except as listed in HPI above).     Objective:   Physical Exam  BP 138/68   Pulse 83   Temp (!) 97.1 F (36.2 C) (Temporal)   Resp 18   Ht 5\' 7"  (1.702 m)   Wt 221 lb 12.8 oz (100.6 kg)   LMP 01/31/2014 (Approximate) Comment: LMP was 3 years ago.  SpO2 100%   BMI 34.74 kg/m   Wt Readings from Last 3 Encounters:  07/26/21 212 lb 14.4 oz (96.6 kg)  06/09/21 226 lb 3.2 oz (102.6 kg)  05/24/21 223 lb 14.4 oz (101.6 kg)    General: Appears her stated age, obese, in NAD. Skin: Warm, dry and intact. No ulcerations noted. HEENT: Head: normal shape and size; Eyes: EOMs intact;  Cardiovascular: Normal rate and rhythm. S1,S2 noted.  No murmur, rubs or gallops noted. No JVD or BLE edema. No carotid bruits noted. Pulmonary/Chest: Normal effort and positive vesicular breath sounds. No respiratory distress. No wheezes, rales or ronchi noted.  Musculoskeletal: No difficulty with gait.  Neurological: Alert and oriented. Negative Phalen's. Negative Tinel's. Psychiatric: Mood and affect normal. Behavior is normal. Judgment and thought content normal.    BMET    Component Value Date/Time   NA 135 05/03/2021 1406   K 3.5 05/03/2021 1406   CL 103  05/03/2021 1406   CO2 25 05/03/2021 1406   GLUCOSE 118 (H) 05/03/2021 1406   BUN 16 05/03/2021 1406   CREATININE 0.92 05/03/2021 1406   CREATININE 0.87 03/22/2020 0802   CALCIUM 9.3 05/03/2021 1406   GFRNONAA >60 05/03/2021 1406   GFRNONAA 77 03/22/2020 0802   GFRAA 89 03/22/2020 0802    Lipid Panel     Component Value Date/Time   CHOL 165 06/09/2021 1029   CHOL 168 03/01/2021 1649   TRIG 90 06/09/2021 1029   HDL 35 (L) 06/09/2021 1029   HDL 33 (L) 03/01/2021  1649   CHOLHDL 4.7 06/09/2021 1029   VLDL 23 04/04/2017 0941   LDLCALC 111 (H) 06/09/2021 1029    CBC    Component Value Date/Time   WBC 7.7 05/03/2021 1406   RBC 4.28 05/03/2021 1406   HGB 11.0 (L) 05/03/2021 1406   HCT 34.7 (L) 05/03/2021 1406   PLT 390 05/03/2021 1406   MCV 81.1 05/03/2021 1406   MCH 25.7 (L) 05/03/2021 1406   MCHC 31.7 05/03/2021 1406   RDW 17.1 (H) 05/03/2021 1406   LYMPHSABS 1.9 05/03/2021 1406   MONOABS 0.3 05/03/2021 1406   EOSABS 0.1 05/03/2021 1406   BASOSABS 0.0 05/03/2021 1406    Hgb A1C Lab Results  Component Value Date   HGBA1C 6.2 (H) 06/09/2021            Assessment & Plan:   Webb Silversmith, NP This visit occurred during the SARS-CoV-2 public health emergency.  Safety protocols were in place, including screening questions prior to the visit, additional usage of staff PPE, and extensive cleaning of exam room while observing appropriate contact time as indicated for disinfecting solutions.

## 2021-09-19 NOTE — Patient Instructions (Signed)
Carbohydrate Counting for Diabetes Mellitus, Adult Carbohydrate counting is a method of keeping track of how many carbohydrates you eat. Eating carbohydrates naturally increases the amount of sugar (glucose) in the blood. Counting how many carbohydrates you eat improves your blood glucose control, which helps you manage your diabetes. It is important to know how many carbohydrates you can safely have in each meal. This is different for every person. A dietitian can help you make a meal plan and calculate how many carbohydrates you should have at each meal and snack. What foods contain carbohydrates? Carbohydrates are found in the following foods: Grains, such as breads and cereals. Dried beans and soy products. Starchy vegetables, such as potatoes, peas, and corn. Fruit and fruit juices. Milk and yogurt. Sweets and snack foods, such as cake, cookies, candy, chips, and soft drinks. How do I count carbohydrates in foods? There are two ways to count carbohydrates in food. You can read food labels or learn standard serving sizes of foods. You can use either of the methods or a combination of both. Using the Nutrition Facts label The Nutrition Facts list is included on the labels of almost all packaged foods and beverages in the [place] It includes: The serving size. Information about nutrients in each serving, including the grams (g) of carbohydrate per serving. To use the Nutrition Facts: Decide how many servings you will have. Multiply the number of servings by the number of carbohydrates per serving. The resulting number is the total amount of carbohydrates that you will be having. Learning the standard serving sizes of foods When you eat carbohydrate foods that are not packaged or do not include Nutrition Facts on the label, you need to measure the servings in order to count the amount of carbohydrates. Measure the foods that you will eat with a food scale or measuring cup, if needed. Decide how  many standard-size servings you will eat. Multiply the number of servings by 15. For foods that contain carbohydrates, one serving equals 15 g of carbohydrates. For example, if you eat 2 cups or 10 oz (300 g) of strawberries, you will have eaten 2 servings and 30 g of carbohydrates (2 servings x 15 g = 30 g). For foods that have more than one food mixed, such as soups and casseroles, you must count the carbohydrates in each food that is included. The following list contains standard serving sizes of common carbohydrate-rich foods. Each of these servings has about 15 g of carbohydrates: 1 slice of bread. 1 six-inch (15 cm) tortilla. ? cup or 2 oz (53 g) cooked rice or pasta.  cup or 3 oz (85 g) cooked or canned, drained and rinsed beans or lentils.  cup or 3 oz (85 g) starchy vegetable, such as peas, corn, or squash.  cup or 4 oz (120 g) hot cereal.  cup or 3 oz (85 g) boiled or mashed potatoes, or  or 3 oz (85 g) of a large baked potato.  cup or 4 fl oz (118 mL) fruit juice. 1 cup or 8 fl oz (237 mL) milk. 1 small or 4 oz (106 g) apple.  or 2 oz (63 g) of a medium banana. 1 cup or 5 oz (150 g) strawberries. 3 cups or 1 oz (24 g) popped popcorn. What is an example of carbohydrate counting? To calculate the number of carbohydrates in this sample meal, follow the steps shown below. Sample meal 3 oz (85 g) chicken breast. ? cup or 4 oz (106 g) brown   rice.  cup or 3 oz (85 g) corn. 1 cup or 8 fl oz (237 mL) milk. 1 cup or 5 oz (150 g) strawberries with sugar-free whipped topping. Carbohydrate calculation Identify the foods that contain carbohydrates: Rice. Corn. Milk. Strawberries. Calculate how many servings you have of each food: 2 servings rice. 1 serving corn. 1 serving milk. 1 serving strawberries. Multiply each number of servings by 15 g: 2 servings rice x 15 g = 30 g. 1 serving corn x 15 g = 15 g. 1 serving milk x 15 g = 15 g. 1 serving strawberries x 15 g = 15  g. Add together all of the amounts to find the total grams of carbohydrates eaten: 30 g + 15 g + 15 g + 15 g = 75 g of carbohydrates total. What are tips for following this plan? Shopping Develop a meal plan and then make a shopping list. Buy fresh and frozen vegetables, fresh and frozen fruit, dairy, eggs, beans, lentils, and whole grains. Look at food labels. Choose foods that have more fiber and less sugar. Avoid processed foods and foods with added sugars. Meal planning Aim to have the same amount of carbohydrates at each meal and for each snack time. Plan to have regular, balanced meals and snacks. Where to find more information American Diabetes Association: www.diabetes.org Centers for Disease Control and Prevention: www.cdc.gov Summary Carbohydrate counting is a method of keeping track of how many carbohydrates you eat. Eating carbohydrates naturally increases the amount of sugar (glucose) in the blood. Counting how many carbohydrates you eat improves your blood glucose control, which helps you manage your diabetes. A dietitian can help you make a meal plan and calculate how many carbohydrates you should have at each meal and snack. This information is not intended to replace advice given to you by your health care provider. Make sure you discuss any questions you have with your health care provider. Document Revised: 11/06/2019 Document Reviewed: 11/07/2019 Elsevier Patient Education  2021 Elsevier Inc.  

## 2021-09-19 NOTE — Assessment & Plan Note (Signed)
Controlled on Lisinopril and Triamterene HCT Reinforced DASH exercise for weight loss CMET today

## 2021-09-19 NOTE — Assessment & Plan Note (Signed)
Encouraged diet and exercise for weight loss ?

## 2021-09-19 NOTE — Assessment & Plan Note (Signed)
CMET and Lipid profile today Encouraged her to consume a low fat diet Continue Atorvastatin

## 2021-09-20 ENCOUNTER — Other Ambulatory Visit: Payer: Self-pay | Admitting: Internal Medicine

## 2021-09-20 DIAGNOSIS — E876 Hypokalemia: Secondary | ICD-10-CM

## 2021-09-20 DIAGNOSIS — T451X5A Adverse effect of antineoplastic and immunosuppressive drugs, initial encounter: Secondary | ICD-10-CM

## 2021-09-20 DIAGNOSIS — G620 Drug-induced polyneuropathy: Secondary | ICD-10-CM

## 2021-09-20 LAB — COMPLETE METABOLIC PANEL WITH GFR
AG Ratio: 1.3 (calc) (ref 1.0–2.5)
ALT: 13 U/L (ref 6–29)
AST: 13 U/L (ref 10–35)
Albumin: 3.9 g/dL (ref 3.6–5.1)
Alkaline phosphatase (APISO): 102 U/L (ref 37–153)
BUN: 16 mg/dL (ref 7–25)
CO2: 26 mmol/L (ref 20–32)
Calcium: 9.4 mg/dL (ref 8.6–10.4)
Chloride: 107 mmol/L (ref 98–110)
Creat: 0.92 mg/dL (ref 0.50–1.03)
Globulin: 3.1 g/dL (calc) (ref 1.9–3.7)
Glucose, Bld: 101 mg/dL — ABNORMAL HIGH (ref 65–99)
Potassium: 4 mmol/L (ref 3.5–5.3)
Sodium: 141 mmol/L (ref 135–146)
Total Bilirubin: 0.3 mg/dL (ref 0.2–1.2)
Total Protein: 7 g/dL (ref 6.1–8.1)
eGFR: 74 mL/min/{1.73_m2} (ref >=60)

## 2021-09-20 LAB — LIPID PANEL
Cholesterol: 123 mg/dL (ref <200)
HDL: 37 mg/dL — ABNORMAL LOW (ref >=50)
LDL Cholesterol (Calc): 69 mg/dL (calc)
Non-HDL Cholesterol (Calc): 86 mg/dL (calc) (ref <130)
Total CHOL/HDL Ratio: 3.3 (calc) (ref <5.0)
Triglycerides: 89 mg/dL (ref <150)

## 2021-09-20 LAB — HEMOGLOBIN A1C
Hgb A1c MFr Bld: 6.3 % of total Hgb — ABNORMAL HIGH (ref <5.7)
Mean Plasma Glucose: 134 mg/dL
eAG (mmol/L): 7.4 mmol/L

## 2021-09-20 MED ORDER — GABAPENTIN 400 MG PO CAPS: 400.0000 mg | ORAL_CAPSULE | Freq: Every day | ORAL | 1 refills | Status: DC

## 2021-09-20 MED ORDER — POTASSIUM CHLORIDE ER 10 MEQ PO TBCR: 10.0000 meq | EXTENDED_RELEASE_TABLET | Freq: Two times a day (BID) | ORAL | 1 refills | Status: DC

## 2021-09-20 NOTE — Telephone Encounter (Signed)
Medication Refill - Medication: 90 day supply of potassium chloride (KLOR-CON) 10 MEQ tablet, gabapentin (NEURONTIN) 400 MG capsule  Has the patient contacted their pharmacy? Yes.    (Agent: If yes, when and what did the pharmacy advise?) [facility] pharmacist checking on the status of fax refill request  Preferred Pharmacy (with phone number or street name):   [place], [place] Phone:  [phone]  Fax:  [phone]      Has the patient been seen for an appointment in the last year OR does the patient have an upcoming appointment? Yes.    Agent: Please be advised that RX refills may take up to 3 business days. We ask that you follow-up with your pharmacy.

## 2021-09-20 NOTE — Telephone Encounter (Signed)
Requested Prescriptions  Pending Prescriptions Disp Refills  . potassium chloride (KLOR-CON) 10 MEQ tablet 180 tablet 1    Sig: Take 1 tablet (10 mEq total) by mouth 2 (two) times daily.     Endocrinology:  Minerals - Potassium Supplementation Passed - 09/20/2021  6:45 PM      Passed - K in normal range and within 360 days    Potassium  Date Value Ref Range Status  09/19/2021 4.0 3.5 - 5.3 mmol/L Final         Passed - Cr in normal range and within 360 days    Creat  Date Value Ref Range Status  09/19/2021 0.92 0.50 - 1.03 mg/dL Final   Creatinine, Urine  Date Value Ref Range Status  06/09/2021 200 20 - 275 mg/dL Final         Passed - Valid encounter within last 12 months    Recent Outpatient Visits          Yesterday Controlled type 2 diabetes mellitus with hyperglycemia, without long-term current use of insulin ([place])   [facility], Coralie Keens, NP   3 months ago Controlled type 2 diabetes mellitus with hyperglycemia, without long-term current use of insulin [facility])   [facility], Coralie Keens, NP   6 months ago Right ear impacted cerumen   [place], DO   11 months ago Controlled type 2 diabetes mellitus with hyperglycemia, without long-term current use of insulin [facility])   [facility], Lupita Raider, FNP   1 year ago Controlled type 2 diabetes mellitus with hyperglycemia, without long-term current use of insulin [facility])   [facility], Lupita Raider, FNP      Future Appointments            In 2 months Rubie Maid, MD [facility]   In 2 months [place], Coralie Keens, NP [facility] [place]   In 3 months [place], Coralie Keens, NP [facility]           . gabapentin (NEURONTIN) 400 MG capsule 90 capsule 1    Sig: Take 1 capsule (400 mg total) by mouth at bedtime.     Neurology: Anticonvulsants - gabapentin Passed - 09/20/2021   6:45 PM      Passed - Valid encounter within last 12 months    Recent Outpatient Visits          Yesterday Controlled type 2 diabetes mellitus with hyperglycemia, without long-term current use of insulin [facility])   [facility], Coralie Keens, NP   3 months ago Controlled type 2 diabetes mellitus with hyperglycemia, without long-term current use of insulin [facility])   [facility], Coralie Keens, NP   6 months ago Right ear impacted cerumen   [place], DO   11 months ago Controlled type 2 diabetes mellitus with hyperglycemia, without long-term current use of insulin [facility])   [facility], Lupita Raider, FNP   1 year ago Controlled type 2 diabetes mellitus with hyperglycemia, without long-term current use of insulin [facility])   [facility], Lupita Raider, FNP      Future Appointments            In 2 months Rubie Maid, MD [facility]   In 2 months [place], Coralie Keens, NP [facility] [place] [facility]   In 3 months [place], Coralie Keens, NP [facility]

## 2021-09-23 ENCOUNTER — Other Ambulatory Visit: Payer: Self-pay

## 2021-09-27 ENCOUNTER — Encounter: Payer: Medicare HMO | Admitting: Obstetrics and Gynecology

## 2021-10-10 NOTE — Progress Notes (Deleted)
    GYNECOLOGY PROGRESS NOTE  Subjective:    Patient ID: Chelsea Hudson, female    DOB: Jul 14, 1966, 55 y.o.   MRN: [id number]  HPI  Patient is a 55 y.o. female who presents for 2 month weight management follow up. She has a past history of obesity and diabetes. Resumed Phentermine after being off for 1 month as she lost her pill bottle. She denies side effects from the medications.  Starting weight at onset of weight loss journey was 255 lbs. Desires to get to 200 lbs.       Current interventions:  1. Diet -  2. Activity -  3. Reports bowel movements are ***.    {Common ambulatory SmartLinks:19316}  Review of Systems {ros; complete:30496}   Objective:    Vitals with BMI 09/19/2021 09/19/2021 07/26/2021  Height - 5\' 7"  5\' 7"   Weight - 221 lbs 13 oz 212 lbs 14 oz  BMI - 99.77 41.42  Systolic 395 320 233  Diastolic 68 72 72  Pulse - 83 111    General appearance: {general exam:16600} Abdomen: soft, non-tender.  Waist circumference *** in.    Labs:   Assessment:   Weight management Obesity, There is no height or weight on file to calculate BMI.  Plan:   Weight management  - doing well with weight loss, can continue current management.      Rubie Maid, MD [facility]

## 2021-10-11 ENCOUNTER — Encounter: Payer: Medicare HMO | Admitting: Obstetrics and Gynecology

## 2021-10-19 ENCOUNTER — Ambulatory Visit: Payer: Medicare Other

## 2021-10-20 ENCOUNTER — Encounter: Payer: Self-pay | Admitting: Obstetrics and Gynecology

## 2021-10-27 ENCOUNTER — Other Ambulatory Visit: Payer: Self-pay | Admitting: *Deleted

## 2021-10-27 DIAGNOSIS — Z170 Estrogen receptor positive status [ER+]: Secondary | ICD-10-CM

## 2021-10-27 DIAGNOSIS — C50411 Malignant neoplasm of upper-outer quadrant of right female breast: Secondary | ICD-10-CM

## 2021-11-04 ENCOUNTER — Inpatient Hospital Stay: Payer: Medicare Other | Attending: Nurse Practitioner

## 2021-11-04 ENCOUNTER — Telehealth: Payer: Self-pay | Admitting: Obstetrics and Gynecology

## 2021-11-04 ENCOUNTER — Encounter: Payer: Self-pay | Admitting: Nurse Practitioner

## 2021-11-04 ENCOUNTER — Other Ambulatory Visit: Payer: Self-pay

## 2021-11-04 ENCOUNTER — Inpatient Hospital Stay (HOSPITAL_BASED_OUTPATIENT_CLINIC_OR_DEPARTMENT_OTHER): Payer: Medicare Other | Admitting: Nurse Practitioner

## 2021-11-04 VITALS — BP 106/72 | HR 102 | Temp 98.1°F | Resp 16 | Ht 67.0 in | Wt 223.2 lb

## 2021-11-04 DIAGNOSIS — Z5181 Encounter for therapeutic drug level monitoring: Secondary | ICD-10-CM | POA: Diagnosis not present

## 2021-11-04 DIAGNOSIS — Z853 Personal history of malignant neoplasm of breast: Secondary | ICD-10-CM | POA: Diagnosis not present

## 2021-11-04 DIAGNOSIS — Z95828 Presence of other vascular implants and grafts: Secondary | ICD-10-CM

## 2021-11-04 DIAGNOSIS — Z79811 Long term (current) use of aromatase inhibitors: Secondary | ICD-10-CM

## 2021-11-04 DIAGNOSIS — D638 Anemia in other chronic diseases classified elsewhere: Secondary | ICD-10-CM

## 2021-11-04 DIAGNOSIS — Z08 Encounter for follow-up examination after completed treatment for malignant neoplasm: Secondary | ICD-10-CM

## 2021-11-04 DIAGNOSIS — Z170 Estrogen receptor positive status [ER+]: Secondary | ICD-10-CM

## 2021-11-04 LAB — CBC WITH DIFFERENTIAL/PLATELET
Abs Immature Granulocytes: 0.02 10*3/uL (ref 0.00–0.07)
Basophils Absolute: 0 10*3/uL (ref 0.0–0.1)
Basophils Relative: 1 %
Eosinophils Absolute: 0.1 10*3/uL (ref 0.0–0.5)
Eosinophils Relative: 1 %
HCT: 35.1 % — ABNORMAL LOW (ref 36.0–46.0)
Hemoglobin: 11.1 g/dL — ABNORMAL LOW (ref 12.0–15.0)
Immature Granulocytes: 0 %
Lymphocytes Relative: 22 %
Lymphs Abs: 1.9 10*3/uL (ref 0.7–4.0)
MCH: 26.4 pg (ref 26.0–34.0)
MCHC: 31.6 g/dL (ref 30.0–36.0)
MCV: 83.4 fL (ref 80.0–100.0)
Monocytes Absolute: 0.4 10*3/uL (ref 0.1–1.0)
Monocytes Relative: 5 %
Neutro Abs: 6.2 10*3/uL (ref 1.7–7.7)
Neutrophils Relative %: 71 %
Platelets: 340 10*3/uL (ref 150–400)
RBC: 4.21 MIL/uL (ref 3.87–5.11)
RDW: 17.2 % — ABNORMAL HIGH (ref 11.5–15.5)
WBC: 8.7 10*3/uL (ref 4.0–10.5)
nRBC: 0 % (ref 0.0–0.2)

## 2021-11-04 LAB — COMPREHENSIVE METABOLIC PANEL
ALT: 19 U/L (ref 0–44)
AST: 18 U/L (ref 15–41)
Albumin: 3.6 g/dL (ref 3.5–5.0)
Alkaline Phosphatase: 97 U/L (ref 38–126)
Anion gap: 11 (ref 5–15)
BUN: 23 mg/dL — ABNORMAL HIGH (ref 6–20)
CO2: 23 mmol/L (ref 22–32)
Calcium: 9 mg/dL (ref 8.9–10.3)
Chloride: 104 mmol/L (ref 98–111)
Creatinine, Ser: 0.83 mg/dL (ref 0.44–1.00)
GFR, Estimated: >60 mL/min (ref >60)
Glucose, Bld: 96 mg/dL (ref 70–99)
Potassium: 3.7 mmol/L (ref 3.5–5.1)
Sodium: 138 mmol/L (ref 135–145)
Total Bilirubin: 0.3 mg/dL (ref 0.3–1.2)
Total Protein: 7.7 g/dL (ref 6.5–8.1)

## 2021-11-04 MED ORDER — HEPARIN SOD (PORK) LOCK FLUSH 100 UNIT/ML IV SOLN
500.0000 [IU] | Freq: Once | INTRAVENOUS | Status: AC
  Administered 2021-11-04: 500 [IU] via INTRAVENOUS
  Filled 2021-11-04: qty 5

## 2021-11-04 MED ORDER — SODIUM CHLORIDE 0.9% FLUSH
10.0000 mL | Freq: Once | INTRAVENOUS | Status: AC
  Administered 2021-11-04: 10 mL via INTRAVENOUS
  Filled 2021-11-04: qty 10

## 2021-11-04 NOTE — Progress Notes (Unsigned)
[facility]  [address] [place], [place] [address] Phone: [phone]  Fax: [phone]   Clinic Day:  11/04/2021  Referring physician: Verl Bangs, FNP   Chief Complaint: Chelsea Hudson is a 55 y.o. female with stage IIIC right breast cancer who is seen for 8 month assessment.   HPI:  Chelsea Hudson is a 55 y.o. 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  Biopsy on 02/28/2017 revealed invasive carcinoma with mucinous features.  Lymph node biopsy revealed metastatic disease.  Tumor was ER positive (90%), PR positive (90%) and HER-2/neu negative.  She had clinical stage [id number] breast cancer.   PET scan on 03/09/2017 revealed hypermetabolic right axillary/subpectoral adenopathy.  There was low-grade activity in the right upper breast likely at the postoperative site.  Appearance was compatible with metastatic spread to right axillary/subpectoral lymph nodes.  There was some sigmoid colon diverticulosis, with faint inflammatory findings adjacent to the sigmoid colon proximally, and with accentuated activity in the involved segment of the sigmoid colon.    Partial mastectomy and sentinel lymph node biopsy on 07/30/2017 revealed a 1.8 cm invasive mammary carcinoma with mucinous features.  There was lymphovascular invasion.  Eight of 12 lymph nodes were positive for metastatic disease.   She required multiple excisions to obtain clear margins resulting in a full mastectomy on 09/28/2017.  She underwent complete axillary node dissection on 03/04/2018.  Level 2 and 3 lymph node dissection revealed 3 of 3 lymph nodes positive for carcinoma.  The largest tumor deposit was at least 20 mm.  Extracapsular extension was present < 1 mm beyond the lymph node capsule.    Mammogram and ultrasound on 02/09/2017 revealed an irregular 2.1 x 1.0 x 0.9 cm hypoechoic mass  with a feeding vessel 11:00, 8 cm from the nipple.  There was overlying skin thickness.  Ultrasound of the right axilla revealed at least 3 bulky lymph nodes deep to the pectoralis (level 2 lymphadenopathy).  Lymph node measured 1.6 x 1.4 x 1.9 cm and 2 x 1.7 x 1.8 cm.   She received 4 cycles of AC (03/22/2017 - 05/02/2017) and 7 of 12 cycles of neoadjuvant Taxol (05/16/2017 - 06/27/2017).  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  She received adjuvant radiation at [facility].  She began Femara in 05/2018.   Left mammogram on 10/09/2018 revealed no suspicious masses, asymmetries, or malignant calcifications.  Left screening mammogram on 10/13/2019 showed no evidence of malignancy.     CA27.29 has been followed: 21.1 on 06/12/2019, 18.4 on 10/15/2019, 16.8 on 02/12/2020, and 18.2 on 10/28/2020.  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   Bone density on 07/17/2018 was normal with a T-score of -0.3 at the AP spine L1-L4.  She is not interested in the COVID-19 vaccine.   Interval History:    Past Medical History:  Diagnosis Date   Arthritis    SHOULDER   Breast cancer ([place]) 02/2017   rt breast   Breast cancer ([place]) 2018   Cancer ([place]) 02/28/2017   INVASIVE MAMMARY CARCINOMA WITH MUCINOUS FEATURES.   Colonic diverticular abscess 06/21/2017   Colonoscopy 06/25/2017: No evidence of malignancy.   Diabetes mellitus without complication ([place])    Hypertension    Irregular heart beat    PT STATES IT "SKIPS A BEAT"    Obesity    Personal history of chemotherapy  Personal history of radiation therapy     Past Surgical History:  Procedure Laterality Date   ANKLE SURGERY     BREAST BIOPSY Right 02/28/2017   INVASIVE MAMMARY CARCINOMA WITH MUCINOUS FEATURES.   BREAST CYST ASPIRATION Right    NEG   COLONOSCOPY WITH PROPOFOL N/A 06/25/2017   Procedure: COLONOSCOPY WITH PROPOFOL;  Surgeon: Jonathon Bellows, MD;  Location:  [facility] ENDOSCOPY;  Service: Gastroenterology;  Laterality: N/A;   MASTECTOMY, PARTIAL Right 07/30/2017   Procedure: MASTECTOMY PARTIAL;  Surgeon: Robert Bellow, MD;  Location: [facility] ORS;  Service: General;  Laterality: Right;   PORTACATH PLACEMENT Left 03/15/2017   Procedure: INSERTION PORT-A-CATH;  Surgeon: Robert Bellow, MD;  Location: [facility] ORS;  Service: General;  Laterality: Left;   RE-EXCISION OF BREAST LUMPECTOMY Right 08/17/2017    INVASIVE CARCINOMA EXTENDS TO NEW LATERAL MARGIN. /RE-EXCISION OF BREAST LUMPECTOMY;: Byrnett, Forest Gleason, MD;  [facility] ORS; General;  Laterality: Right;   SENTINEL NODE BIOPSY Right 07/30/2017   Procedure: SENTINEL NODE BIOPSY;  Surgeon: Robert Bellow, MD;  Location: [facility] ORS;  Service: General;  Laterality: Right;   SIMPLE MASTECTOMY WITH AXILLARY SENTINEL NODE BIOPSY Right 09/28/2017   Procedure: SIMPLE MASTECTOMY;  Surgeon: Robert Bellow, MD;  Location: [facility] ORS;  Service: General;  Laterality: Right;    Family History  Problem Relation Age of Onset   Diabetes Father    Stroke Father    Hypertension Father    Hypertension Mother    Brain cancer Maternal Aunt 60   Diabetes Sister    Colon cancer Neg Hx    Breast cancer Neg Hx     Social History:  reports that she quit smoking about 4 years ago. Her smoking use included cigarettes. She has a 6.25 pack-year smoking history. She has quit using smokeless tobacco. She reports that she does not currently use alcohol. She reports that she does not use drugs. She is a Administrator. She denies any exposure to any toxins and radiations. The patient is alone today.  Allergies:  Allergies  Allergen Reactions   Other Hives and Itching    [name]     Current Medications: Current Outpatient Medications  Medication Sig Dispense Refill   atorvastatin (LIPITOR) 10 MG tablet Take 1 tablet (10 mg total)  by mouth daily. 90 tablet 1   gabapentin (NEURONTIN) 400 MG capsule Take 1 capsule (400 mg total) by mouth at bedtime. 90 capsule 1   letrozole (FEMARA) 2.5 MG tablet Take 1 tablet (2.5 mg total) by mouth daily. 90 tablet 3   lisinopril (ZESTRIL) 5 MG tablet Take 1 tablet (5 mg total) by mouth daily. 90 tablet 1   metFORMIN (GLUCOPHAGE) 500 MG tablet TAKE 2 TABLETS BY MOUTH ONCE DAILY WITH BREAKFAST AND 1 WITH SUPPER 270 tablet 1   phentermine 37.5 MG capsule Take 1 capsule (37.5 mg total) by mouth every morning. 30 capsule 1   potassium chloride (KLOR-CON) 10 MEQ tablet Take 1 tablet (10 mEq total) by mouth 2 (two) times daily. 180 tablet 1   topiramate (TOPAMAX) 50 MG tablet Take 1 tablet (50 mg total) by mouth daily. 90 tablet 3   triamterene-hydrochlorothiazide (MAXZIDE-25) 37.5-25 MG tablet Take 1 tablet by mouth daily. 90 tablet 1   No current facility-administered medications for this visit.    Review of Systems  Constitutional:  Positive for malaise/fatigue. Negative for  chills, fever and weight loss.  HENT:  Negative for congestion, ear pain and tinnitus.   Eyes: Negative.  Negative for blurred vision and double vision.  Respiratory: Negative.  Negative for cough, sputum production and shortness of breath.   Cardiovascular: Negative.  Negative for chest pain, palpitations and leg swelling.  Gastrointestinal: Negative.  Negative for abdominal pain, constipation, diarrhea, nausea and vomiting.  Genitourinary:  Negative for dysuria, frequency and urgency.  Musculoskeletal:  Positive for joint pain. Negative for back pain, falls and neck pain.  Skin: Negative.  Negative for rash.  Neurological: Negative.  Negative for weakness and headaches.  Endo/Heme/Allergies: Negative.  Does not bruise/bleed easily.  Psychiatric/Behavioral: Negative.  Negative for depression. The patient is not nervous/anxious and does not have insomnia.   Performance status (ECOG): 1  Vitals Blood pressure  106/72, pulse (!) 102, temperature 98.1 F (36.7 C), temperature source Tympanic, resp. rate 16, height _0  (1.702 m), weight 223 lb 3.2 oz (101.2 kg), last menstrual period 01/31/2014, SpO2 100 %.   Physical Exam Constitutional:      Appearance: She is well-developed. She is obese.  HENT:     Head: Normocephalic and atraumatic.  Eyes:     Pupils: Pupils are equal, round, and reactive to light.  Cardiovascular:     Rate and Rhythm: Normal rate and regular rhythm.     Heart sounds: No murmur heard. Pulmonary:     Effort: Pulmonary effort is normal.     Breath sounds: Normal breath sounds. No wheezing.  Abdominal:     General: Bowel sounds are normal. There is no distension.     Palpations: Abdomen is soft. There is no mass.     Tenderness: There is no abdominal tenderness.  Musculoskeletal:        General: Normal range of motion.     Cervical back: Normal range of motion.  Skin:    General: Skin is warm and dry.  Neurological:     Mental Status: She is alert and oriented to person, place, and time.  Psychiatric:        Behavior: Behavior normal.   Infusion on 11/04/2021  Component Date Value Ref Range Status   Sodium 11/04/2021 138  135 - 145 mmol/L Final   Potassium 11/04/2021 3.7  3.5 - 5.1 mmol/L Final   Chloride 11/04/2021 104  98 - 111 mmol/L Final   CO2 11/04/2021 23  22 - 32 mmol/L Final   Glucose, Bld 11/04/2021 96  70 - 99 mg/dL Final   Glucose reference range applies only to samples taken after fasting for at least 8 hours.   BUN 11/04/2021 23 (H)  6 - 20 mg/dL Final   Creatinine, Ser 11/04/2021 0.83  0.44 - 1.00 mg/dL Final   Calcium 11/04/2021 9.0  8.9 - 10.3 mg/dL Final   Total Protein 11/04/2021 7.7  6.5 - 8.1 g/dL Final   Albumin 11/04/2021 3.6  3.5 - 5.0 g/dL Final   AST 11/04/2021 18  15 - 41 U/L Final   ALT 11/04/2021 19  0 - 44 U/L Final   Alkaline Phosphatase 11/04/2021 97  38 - 126 U/L Final   Total Bilirubin 11/04/2021 0.3  0.3 - 1.2 mg/dL Final    GFR, Estimated 11/04/2021 >60  >60 mL/min Final   Comment: (NOTE) Calculated using the CKD-EPI Creatinine Equation (2021)    Anion gap 11/04/2021 11  5 - 15 Final   Performed at [facility], [place], [place]  [address]   WBC 11/04/2021 8.7  4.0 - 10.5 K/uL Final   RBC 11/04/2021 4.21  3.87 - 5.11 MIL/uL Final   Hemoglobin 11/04/2021 11.1 (L)  12.0 - 15.0 g/dL Final   HCT 11/04/2021 35.1 (L)  36.0 - 46.0 % Final   MCV 11/04/2021 83.4  80.0 - 100.0 fL Final   MCH 11/04/2021 26.4  26.0 - 34.0 pg Final   MCHC 11/04/2021 31.6  30.0 - 36.0 g/dL Final   RDW 11/04/2021 17.2 (H)  11.5 - 15.5 % Final   Platelets 11/04/2021 340  150 - 400 K/uL Final   nRBC 11/04/2021 0.0  0.0 - 0.2 % Final   Neutrophils Relative % 11/04/2021 71  % Final   Neutro Abs 11/04/2021 6.2  1.7 - 7.7 K/uL Final   Lymphocytes Relative 11/04/2021 22  % Final   Lymphs Abs 11/04/2021 1.9  0.7 - 4.0 K/uL Final   Monocytes Relative 11/04/2021 5  % Final   Monocytes Absolute 11/04/2021 0.4  0.1 - 1.0 K/uL Final   Eosinophils Relative 11/04/2021 1  % Final   Eosinophils Absolute 11/04/2021 0.1  0.0 - 0.5 K/uL Final   Basophils Relative 11/04/2021 1  % Final   Basophils Absolute 11/04/2021 0.0  0.0 - 0.1 K/uL Final   Immature Granulocytes 11/04/2021 0  % Final   Abs Immature Granulocytes 11/04/2021 0.02  0.00 - 0.07 K/uL Final   Performed at [facility], [address] [place], [place] [address]    Assessment & Plan:   Stage IIIC right breast cancer She received neoadjuvant chemotherapy (4 cycles of AC and 7 weeks of Taxol). She underwent completion mastectomy and radiation. She began Femara on 05/2018. Clinically, she is doing well. Exam reveals no evidence of recurrent disease. Left screening mammogram on 10/18/20 revealed no evidence of malignancy.   She is due for repeat mammogram in November 2022.  Orders placed. Continue Femara.    Chemotherapy neuropathy On gabapentin 300 mg  nightly. Continue to monitor  Hypokalemia Potassium 3.5. Continue potassium 10 mEq p.o. twice daily  Bone Density- T score - 0.4; normal. Repeat 04/2023.   Health maintenance Bone density on 07/17/2018 was normal. Discuss need for bone density every 2 years on an aromatase inhibitor. Patient states she was unable to make last appointment for her bone density scan. We will get this rescheduled ASAP.  Anemia Labs from 05/03/2021 show a hemoglobin of 11.0 which is slightly lower than before. Check vitamin B12, iron, folate and ferritin.  Will call patient with results  Disposition Schedule mammogram in Nov 2022 Bone density ASAP.  She is scheduled for bone density tomorrow 05/04/2021. RTC in 6 months for MD assessment, labs (CBC with diff, CMP, CA27.29), and review of bone density.   Greater than 50% was spent in counseling and coordination of care with this patient including but not limited to discussion of the relevant topics above (See A&P) including, but not limited to diagnosis and management of acute and chronic medical conditions.   Faythe Casa, NP 11/04/2021 3:14 PM

## 2021-11-04 NOTE — Telephone Encounter (Signed)
Pt is calling in needing a refill on her phentermine 37.5 MG Pharm:  [facility] [address].  Pt had to cancel her appointment for 11/07/2021 @ 8:45 due to her work schedule unable to get off work.

## 2021-11-05 LAB — CANCER ANTIGEN 27.29: CA 27.29: 21.7 U/mL (ref 0.0–38.6)

## 2021-11-07 ENCOUNTER — Encounter: Payer: Medicare Other | Admitting: Obstetrics and Gynecology

## 2021-11-16 ENCOUNTER — Telehealth: Payer: Self-pay

## 2021-11-16 ENCOUNTER — Other Ambulatory Visit: Payer: Self-pay | Admitting: Internal Medicine

## 2021-11-16 DIAGNOSIS — E876 Hypokalemia: Secondary | ICD-10-CM

## 2021-11-16 DIAGNOSIS — T451X5A Adverse effect of antineoplastic and immunosuppressive drugs, initial encounter: Secondary | ICD-10-CM

## 2021-11-16 DIAGNOSIS — E785 Hyperlipidemia, unspecified: Secondary | ICD-10-CM

## 2021-11-16 DIAGNOSIS — E1165 Type 2 diabetes mellitus with hyperglycemia: Secondary | ICD-10-CM

## 2021-11-16 DIAGNOSIS — I10 Essential (primary) hypertension: Secondary | ICD-10-CM

## 2021-11-16 MED ORDER — LISINOPRIL 5 MG PO TABS: 5.0000 mg | ORAL_TABLET | Freq: Every day | ORAL | 1 refills | Status: DC

## 2021-11-16 MED ORDER — GABAPENTIN 400 MG PO CAPS: 400.0000 mg | ORAL_CAPSULE | Freq: Every day | ORAL | 1 refills | Status: DC

## 2021-11-16 MED ORDER — ATORVASTATIN CALCIUM 10 MG PO TABS: 10.0000 mg | ORAL_TABLET | Freq: Every day | ORAL | 1 refills | Status: DC

## 2021-11-16 MED ORDER — TRIAMTERENE-HCTZ 37.5-25 MG PO TABS: 1.0000 | ORAL_TABLET | Freq: Every day | ORAL | 1 refills | Status: DC

## 2021-11-16 MED ORDER — POTASSIUM CHLORIDE ER 10 MEQ PO TBCR: 10.0000 meq | EXTENDED_RELEASE_TABLET | Freq: Two times a day (BID) | ORAL | 1 refills | Status: DC

## 2021-11-16 MED ORDER — METFORMIN HCL 500 MG PO TABS: ORAL_TABLET | ORAL | 1 refills | Status: DC

## 2021-11-16 NOTE — Telephone Encounter (Signed)
Copied from [place] [id number]. Topic: Quick Communication - Rx Refill/Question >> Nov 16, 2021 12:50 PM Leward Quan A wrote: Medication: potassium chloride (KLOR-CON) 10 MEQ tablet, gabapentin (NEURONTIN) 400 MG capsule  Has the patient contacted their pharmacy? Yes.  Request Rx sent to [facility]  (Agent: If no, request that the patient contact the pharmacy for the refill. If patient does not wish to contact the pharmacy document the reason why and proceed with request.) (Agent: If yes, when and what did the pharmacy advise?)  Preferred Pharmacy (with phone number or street name): [facility] [place] [place] - [address]  Phone:  [phone] Fax:  [phone]    Has the patient been seen for an appointment in the last year OR does the patient have an upcoming appointment? Yes.    Agent: Please be advised that RX refills may take up to 3 business days. We ask that you follow-up with your pharmacy.

## 2021-11-16 NOTE — Telephone Encounter (Signed)
The pt mail order pharmacy was changed in her chart.

## 2021-11-23 ENCOUNTER — Other Ambulatory Visit (HOSPITAL_COMMUNITY)
Admission: RE | Admit: 2021-11-23 | Discharge: 2021-11-23 | Disposition: A | Discharge status: Home / Self Care | Payer: Medicare Other | Source: Ambulatory Visit | Attending: Obstetrics and Gynecology | Admitting: Obstetrics and Gynecology

## 2021-11-23 ENCOUNTER — Ambulatory Visit (INDEPENDENT_AMBULATORY_CARE_PROVIDER_SITE_OTHER): Payer: Medicaid Other | Admitting: Obstetrics and Gynecology

## 2021-11-23 ENCOUNTER — Encounter: Payer: Self-pay | Admitting: Obstetrics and Gynecology

## 2021-11-23 ENCOUNTER — Other Ambulatory Visit: Payer: Self-pay

## 2021-11-23 VITALS — BP 123/68 | HR 90 | Ht 70.0 in | Wt 230.0 lb

## 2021-11-23 DIAGNOSIS — R87810 Cervical high risk human papillomavirus (HPV) DNA test positive: Secondary | ICD-10-CM | POA: Insufficient documentation

## 2021-11-23 DIAGNOSIS — I10 Essential (primary) hypertension: Secondary | ICD-10-CM

## 2021-11-23 DIAGNOSIS — R600 Localized edema: Secondary | ICD-10-CM

## 2021-11-23 DIAGNOSIS — E1165 Type 2 diabetes mellitus with hyperglycemia: Secondary | ICD-10-CM

## 2021-11-23 DIAGNOSIS — Z01419 Encounter for gynecological examination (general) (routine) without abnormal findings: Secondary | ICD-10-CM | POA: Diagnosis not present

## 2021-11-23 DIAGNOSIS — Z8619 Personal history of other infectious and parasitic diseases: Secondary | ICD-10-CM | POA: Diagnosis not present

## 2021-11-23 DIAGNOSIS — E669 Obesity, unspecified: Secondary | ICD-10-CM | POA: Diagnosis not present

## 2021-11-23 MED ORDER — FUROSEMIDE 20 MG PO TABS: 20.0000 mg | ORAL_TABLET | Freq: Every day | ORAL | 0 refills | Status: DC

## 2021-11-23 NOTE — Progress Notes (Signed)
ANNUAL PREVENTATIVE CARE GYNECOLOGY  ENCOUNTER NOTE  Subjective:       Chelsea Hudson is a 56 y.o. G0P0 married female here for a routine annual gynecologic exam. The patient is not currently sexually active. The patient is not taking hormone replacement therapy. Patient denies post-menopausal vaginal bleeding. The patient wears seatbelts: yes. The patient participates in regular exercise: no, not currently. Has the patient ever been transfused or tattooed?: no. The patient reports that there is not domestic violence in her life.  Current complaints: 1.  Notes that since October she has not been as compliant with her diet.  Was previously on weight loss program.     Gynecologic History Patient's last menstrual period was 01/31/2014 (approximate). Contraception: post menopausal status Last Pap: 02/2020. Results were: abnormal, ASCUS HR HPV+ (did no have reflex testing).  Last mammogram: 10/09/2018. Results were: normal Last Colonoscopy: 06/25/2017. Results were: diverticulae.  Last Dexa Scan: never had one.    Obstetric History OB History  Gravida Para Term Preterm AB Living  0            SAB IAB Ectopic Multiple Live Births             Obstetric Comments  1st Menstrual Cycle:  14         Past Medical History:  Diagnosis Date   Arthritis    SHOULDER   Breast cancer ([place]) 02/2017   rt breast   Breast cancer ([place]) 2018   Cancer ([place]) 02/28/2017   INVASIVE MAMMARY CARCINOMA WITH MUCINOUS FEATURES.   Colonic diverticular abscess 06/21/2017   Colonoscopy 06/25/2017: No evidence of malignancy.   Diabetes mellitus without complication ([place])    Hypertension    Irregular heart beat    PT STATES IT "SKIPS A BEAT"    Obesity    Personal history of chemotherapy    Personal history of radiation therapy     Family History  Problem Relation Age of Onset   Diabetes Father    Stroke Father    Hypertension Father    Hypertension Mother    Brain cancer Maternal Aunt 60    Diabetes Sister    Colon cancer Neg Hx    Breast cancer Neg Hx     Past Surgical History:  Procedure Laterality Date   ANKLE SURGERY     BREAST BIOPSY Right 02/28/2017   INVASIVE MAMMARY CARCINOMA WITH MUCINOUS FEATURES.   BREAST CYST ASPIRATION Right    NEG   COLONOSCOPY WITH PROPOFOL N/A 06/25/2017   Procedure: COLONOSCOPY WITH PROPOFOL;  Surgeon: Jonathon Bellows, MD;  Location: [facility] ENDOSCOPY;  Service: Gastroenterology;  Laterality: N/A;   MASTECTOMY, PARTIAL Right 07/30/2017   Procedure: MASTECTOMY PARTIAL;  Surgeon: Robert Bellow, MD;  Location: [facility] ORS;  Service: General;  Laterality: Right;   PORTACATH PLACEMENT Left 03/15/2017   Procedure: INSERTION PORT-A-CATH;  Surgeon: Robert Bellow, MD;  Location: [facility] ORS;  Service: General;  Laterality: Left;   RE-EXCISION OF BREAST LUMPECTOMY Right 08/17/2017    INVASIVE CARCINOMA EXTENDS TO NEW LATERAL MARGIN. /RE-EXCISION OF BREAST LUMPECTOMY;: Robert Bellow, MD;  [place] [facility]; General;  Laterality: Right;   SENTINEL NODE BIOPSY Right 07/30/2017   Procedure: SENTINEL NODE BIOPSY;  Surgeon: Robert Bellow, MD;  Location: [facility] ORS;  Service: General;  Laterality: Right;   SIMPLE MASTECTOMY WITH AXILLARY SENTINEL NODE BIOPSY Right 09/28/2017   Procedure: SIMPLE MASTECTOMY;  Surgeon: Robert Bellow, MD;  Location: [facility] ORS;  Service: General;  Laterality:  Right;    Social History   Socioeconomic History   Marital status: Married    Spouse name: Not on file   Number of children: Not on file   Years of education: Not on file   Highest education level: Not on file  Occupational History   Not on file  Tobacco Use   Smoking status: Former    Packs/day: 0.25    Years: 25.00    Pack years: 6.25    Types: Cigarettes    Quit date: 10/20/2017    Years since quitting: 4.0   Smokeless tobacco: Former  Scientific laboratory technician Use: Never used  Substance and Sexual Activity   Alcohol use: Not Currently    Comment: occas   Drug use:  No   Sexual activity: Not Currently  Other Topics Concern   Not on file  Social History Narrative   Not on file   Social Determinants of Health   Financial Resource Strain: Not on file  Food Insecurity: Not on file  Transportation Needs: Not on file  Physical Activity: Not on file  Stress: Not on file  Social Connections: Not on file  Intimate Partner Violence: Not on file    Current Outpatient Medications on File Prior to Visit  Medication Sig Dispense Refill   atorvastatin (LIPITOR) 10 MG tablet Take 1 tablet (10 mg total) by mouth daily. 90 tablet 1   gabapentin (NEURONTIN) 400 MG capsule Take 1 capsule (400 mg total) by mouth at bedtime. 90 capsule 1   letrozole (FEMARA) 2.5 MG tablet Take 1 tablet (2.5 mg total) by mouth daily. 90 tablet 3   lisinopril (ZESTRIL) 5 MG tablet Take 1 tablet (5 mg total) by mouth daily. 90 tablet 1   metFORMIN (GLUCOPHAGE) 500 MG tablet TAKE 2 TABLETS BY MOUTH ONCE DAILY WITH BREAKFAST AND 1 WITH SUPPER 270 tablet 1   phentermine 37.5 MG capsule Take 1 capsule (37.5 mg total) by mouth every morning. 30 capsule 1   potassium chloride (KLOR-CON) 10 MEQ tablet Take 1 tablet (10 mEq total) by mouth 2 (two) times daily. 180 tablet 1   topiramate (TOPAMAX) 50 MG tablet Take 1 tablet (50 mg total) by mouth daily. 90 tablet 3   triamterene-hydrochlorothiazide (MAXZIDE-25) 37.5-25 MG tablet Take 1 tablet by mouth daily. 90 tablet 1   No current facility-administered medications on file prior to visit.    Allergies  Allergen Reactions   Other Hives and Itching    [name]       Review of Systems ROS Review of Systems - General ROS: negative for - chills, fatigue, fever, hot flashes, night sweats, weight gain or weight loss Psychological ROS: negative for - anxiety, decreased libido, depression, mood swings, physical abuse or sexual abuse Ophthalmic  ROS: negative for - blurry vision, eye pain or loss of vision ENT ROS: negative for - headaches, hearing change, visual changes or vocal changes Allergy and Immunology ROS: negative for - hives, itchy/watery eyes or seasonal allergies Hematological and Lymphatic ROS: negative for - bleeding problems, bruising, swollen lymph nodes or weight loss Endocrine ROS: negative for - galactorrhea, hair pattern changes, hot flashes, malaise/lethargy, mood swings, palpitations, polydipsia/polyuria, skin changes, temperature intolerance or unexpected weight changes Breast ROS: negative for - new or changing breast lumps or nipple discharge Respiratory ROS: negative for - cough or shortness of breath Cardiovascular ROS: negative  for - chest pain, irregular heartbeat, palpitations or shortness of breath Gastrointestinal ROS: no abdominal pain, change in bowel habits, or black or bloody stools Genito-Urinary ROS: no dysuria, trouble voiding, or hematuria Musculoskeletal ROS: negative for - joint pain or joint stiffness Neurological ROS: negative for - bowel and bladder control changes Dermatological ROS: negative for rash and skin lesion changes   Objective:   BP 123/68    Pulse 90    Ht 5\' 10"  (1.778 m)    Wt 230 lb (104.3 kg)    LMP 01/31/2014 (Approximate) Comment: LMP was 3 years ago.   SpO2 98%    BMI 33.00 kg/m  CONSTITUTIONAL: Well-developed, well-nourished female in no acute distress. Mild obesity PSYCHIATRIC: Normal mood and affect. Normal behavior. Normal judgment and thought content. [place]: Alert and oriented to person, place, and time. Normal muscle tone coordination. No cranial nerve deficit noted. HENT:  Normocephalic, atraumatic, External right and left ear normal. Oropharynx is clear and moist EYES: Conjunctivae and EOM are normal. Pupils are equal, round, and reactive to light. No scleral icterus.  NECK: Normal range of motion, supple, no masses.  Normal thyroid.  SKIN: Skin is warm and  dry. No rash noted. Not diaphoretic. No erythema. No pallor. CARDIOVASCULAR: Normal heart rate noted, regular rhythm, no murmur. RESPIRATORY: Clear to auscultation bilaterally. Effort and breath sounds normal, no problems with respiration noted. BREASTS: Symmetric in size. No masses, skin changes, nipple drainage, or lymphadenopathy. ABDOMEN: Soft, normal bowel sounds, no distention noted.  No tenderness, rebound or guarding.  BLADDER: Normal PELVIC:  Bladder no bladder distension noted  Urethra: normal appearing urethra with no masses, tenderness or lesions  Vulva: normal appearing vulva with no masses, tenderness or lesions  Vagina: normal appearing vagina with normal color and discharge, no lesions  Cervix: normal appearing cervix without discharge or lesions  Uterus: uterus is normal size, shape, consistency and nontender  Adnexa: normal adnexa in size, nontender and no masses  RV: External Exam NormaI, No Rectal Masses, and Normal Sphincter tone  MUSCULOSKELETAL: Normal range of motion. No tenderness.  No cyanosis, clubbing, +1 pitting edema bilaterally to calves (notes it has been present for ~ 1 week).  2+ distal pulses. LYMPHATIC: No Axillary, Supraclavicular, or Inguinal Adenopathy.   Labs: Lab Results  Component Value Date   WBC 8.7 11/04/2021   HGB 11.1 (L) 11/04/2021   HCT 35.1 (L) 11/04/2021   MCV 83.4 11/04/2021   PLT 340 11/04/2021    Lab Results  Component Value Date   CREATININE 0.83 11/04/2021   BUN 23 (H) 11/04/2021   NA 138 11/04/2021   K 3.7 11/04/2021   CL 104 11/04/2021   CO2 23 11/04/2021    Lab Results  Component Value Date   ALT 19 11/04/2021   AST 18 11/04/2021   ALKPHOS 97 11/04/2021   BILITOT 0.3 11/04/2021    Lab Results  Component Value Date   CHOL 123 09/19/2021   HDL 37 (L) 09/19/2021   LDLCALC 69 09/19/2021   TRIG 89 09/19/2021   CHOLHDL 3.3 09/19/2021    Lab Results  Component Value Date   TSH 2.40 02/27/2020    Lab  Results  Component Value Date   HGBA1C 6.3 (H) 09/19/2021     Assessment:   1. Well woman exam with routine gynecological exam   2. Cervical high risk HPV (human papillomavirus) test positive   3. Obesity (BMI 30-39.9)   4. Controlled type 2 diabetes mellitus with hyperglycemia, without  long-term current use of insulin ([place])   5. Essential hypertension   6. Bilateral lower extremity edema     Plan:  Pap: Pap Co Test Mammogram:  Scheduled.  Stool Guaiac Testing:  Not Indicated. Up to date on colonoscopy.  Labs:  reviewed.  Routine preventative health maintenance measures emphasized: Exercise/Diet/Weight control, Tobacco Warnings, Alcohol/Substance use risks, Stress Management, and Peer Pressure Issues COVID Vaccination status: Declined.  Flu vaccine: Declined flu vaccine.  DM and HTN currently managed by PCP.  Given prescription for small dose of Lasix for several days for edema. Discussed lowering salt intake  Return to [place], MD [facility]

## 2021-11-23 NOTE — Progress Notes (Deleted)
ANNUAL PREVENTATIVE CARE GYNECOLOGY  ENCOUNTER NOTE  Subjective:       Chelsea Hudson is a 56 y.o. G0P0 married female here for a routine annual gynecologic exam. The patient is not currently sexually active. The patient is not taking hormone replacement therapy. Patient denies post-menopausal vaginal bleeding. The patient wears seatbelts: yes. The patient participates in regular exercise: no, not currently. Has the patient ever been transfused or tattooed?: no. The patient reports that there is not domestic violence in her life.  Current complaints: 1.  Notes that since October she has not been as compliant with her diet.  Was previously on weight loss program.     Gynecologic History Patient's last menstrual period was 01/31/2014 (approximate). Contraception: post menopausal status Last Pap: 02/2020. Results were: abnormal, ASCUS HR HPV+ (did no have reflex testing).  Last mammogram: 10/09/2018. Results were: normal Last Colonoscopy: 06/25/2017. Results were: diverticulae.  Last Dexa Scan: never had one.    Obstetric History OB History  Gravida Para Term Preterm AB Living  0            SAB IAB Ectopic Multiple Live Births             Obstetric Comments  1st Menstrual Cycle:  14         Past Medical History:  Diagnosis Date   Arthritis    SHOULDER   Breast cancer ([place]) 02/2017   rt breast   Breast cancer ([place]) 2018   Cancer ([place]) 02/28/2017   INVASIVE MAMMARY CARCINOMA WITH MUCINOUS FEATURES.   Colonic diverticular abscess 06/21/2017   Colonoscopy 06/25/2017: No evidence of malignancy.   Diabetes mellitus without complication ([place])    Hypertension    Irregular heart beat    PT STATES IT "SKIPS A BEAT"    Obesity    Personal history of chemotherapy    Personal history of radiation therapy     Family History  Problem Relation Age of Onset   Diabetes Father    Stroke Father    Hypertension Father    Hypertension Mother    Brain cancer Maternal Aunt 60    Diabetes Sister    Colon cancer Neg Hx    Breast cancer Neg Hx     Past Surgical History:  Procedure Laterality Date   ANKLE SURGERY     BREAST BIOPSY Right 02/28/2017   INVASIVE MAMMARY CARCINOMA WITH MUCINOUS FEATURES.   BREAST CYST ASPIRATION Right    NEG   COLONOSCOPY WITH PROPOFOL N/A 06/25/2017   Procedure: COLONOSCOPY WITH PROPOFOL;  Surgeon: Jonathon Bellows, MD;  Location: [facility] ENDOSCOPY;  Service: Gastroenterology;  Laterality: N/A;   MASTECTOMY, PARTIAL Right 07/30/2017   Procedure: MASTECTOMY PARTIAL;  Surgeon: Robert Bellow, MD;  Location: [facility] ORS;  Service: General;  Laterality: Right;   PORTACATH PLACEMENT Left 03/15/2017   Procedure: INSERTION PORT-A-CATH;  Surgeon: Robert Bellow, MD;  Location: [facility] ORS;  Service: General;  Laterality: Left;   RE-EXCISION OF BREAST LUMPECTOMY Right 08/17/2017    INVASIVE CARCINOMA EXTENDS TO NEW LATERAL MARGIN. /RE-EXCISION OF BREAST LUMPECTOMY;: Robert Bellow, MD;  [place] [facility]; General;  Laterality: Right;   SENTINEL NODE BIOPSY Right 07/30/2017   Procedure: SENTINEL NODE BIOPSY;  Surgeon: Robert Bellow, MD;  Location: [facility] ORS;  Service: General;  Laterality: Right;   SIMPLE MASTECTOMY WITH AXILLARY SENTINEL NODE BIOPSY Right 09/28/2017   Procedure: SIMPLE MASTECTOMY;  Surgeon: Robert Bellow, MD;  Location: [facility] ORS;  Service: General;  Laterality:  Right;    Social History   Socioeconomic History   Marital status: Married    Spouse name: Not on file   Number of children: Not on file   Years of education: Not on file   Highest education level: Not on file  Occupational History   Not on file  Tobacco Use   Smoking status: Former    Packs/day: 0.25    Years: 25.00    Pack years: 6.25    Types: Cigarettes    Quit date: 10/20/2017    Years since quitting: 4.0   Smokeless tobacco: Former  Scientific laboratory technician Use: Never used  Substance and Sexual Activity   Alcohol use: Not Currently    Comment: occas   Drug use:  No   Sexual activity: Not Currently  Other Topics Concern   Not on file  Social History Narrative   Not on file   Social Determinants of Health   Financial Resource Strain: Not on file  Food Insecurity: Not on file  Transportation Needs: Not on file  Physical Activity: Not on file  Stress: Not on file  Social Connections: Not on file  Intimate Partner Violence: Not on file    Current Outpatient Medications on File Prior to Visit  Medication Sig Dispense Refill   atorvastatin (LIPITOR) 10 MG tablet Take 1 tablet (10 mg total) by mouth daily. 90 tablet 1   gabapentin (NEURONTIN) 400 MG capsule Take 1 capsule (400 mg total) by mouth at bedtime. 90 capsule 1   letrozole (FEMARA) 2.5 MG tablet Take 1 tablet (2.5 mg total) by mouth daily. 90 tablet 3   lisinopril (ZESTRIL) 5 MG tablet Take 1 tablet (5 mg total) by mouth daily. 90 tablet 1   metFORMIN (GLUCOPHAGE) 500 MG tablet TAKE 2 TABLETS BY MOUTH ONCE DAILY WITH BREAKFAST AND 1 WITH SUPPER 270 tablet 1   phentermine 37.5 MG capsule Take 1 capsule (37.5 mg total) by mouth every morning. 30 capsule 1   potassium chloride (KLOR-CON) 10 MEQ tablet Take 1 tablet (10 mEq total) by mouth 2 (two) times daily. 180 tablet 1   topiramate (TOPAMAX) 50 MG tablet Take 1 tablet (50 mg total) by mouth daily. 90 tablet 3   triamterene-hydrochlorothiazide (MAXZIDE-25) 37.5-25 MG tablet Take 1 tablet by mouth daily. 90 tablet 1   No current facility-administered medications on file prior to visit.    Allergies  Allergen Reactions   Other Hives and Itching    [name]       Review of Systems ROS Review of Systems - General ROS: negative for - chills, fatigue, fever, hot flashes, night sweats, weight gain or weight loss Psychological ROS: negative for - anxiety, decreased libido, depression, mood swings, physical abuse or sexual abuse Ophthalmic  ROS: negative for - blurry vision, eye pain or loss of vision ENT ROS: negative for - headaches, hearing change, visual changes or vocal changes Allergy and Immunology ROS: negative for - hives, itchy/watery eyes or seasonal allergies Hematological and Lymphatic ROS: negative for - bleeding problems, bruising, swollen lymph nodes or weight loss Endocrine ROS: negative for - galactorrhea, hair pattern changes, hot flashes, malaise/lethargy, mood swings, palpitations, polydipsia/polyuria, skin changes, temperature intolerance or unexpected weight changes Breast ROS: negative for - new or changing breast lumps or nipple discharge Respiratory ROS: negative for - cough or shortness of breath Cardiovascular ROS: negative  for - chest pain, irregular heartbeat, palpitations or shortness of breath Gastrointestinal ROS: no abdominal pain, change in bowel habits, or black or bloody stools Genito-Urinary ROS: no dysuria, trouble voiding, or hematuria Musculoskeletal ROS: negative for - joint pain or joint stiffness Neurological ROS: negative for - bowel and bladder control changes Dermatological ROS: negative for rash and skin lesion changes   Objective:   BP 123/68    Pulse 90    Ht 5\' 10"  (1.778 m)    Wt 230 lb (104.3 kg)    LMP 01/31/2014 (Approximate) Comment: LMP was 3 years ago.   SpO2 98%    BMI 33.00 kg/m  CONSTITUTIONAL: Well-developed, well-nourished female in no acute distress.  PSYCHIATRIC: Normal mood and affect. Normal behavior. Normal judgment and thought content. [place]: Alert and oriented to person, place, and time. Normal muscle tone coordination. No cranial nerve deficit noted. HENT:  Normocephalic, atraumatic, External right and left ear normal. Oropharynx is clear and moist EYES: Conjunctivae and EOM are normal. Pupils are equal, round, and reactive to light. No scleral icterus.  NECK: Normal range of motion, supple, no masses.  Normal thyroid.  SKIN: Skin is warm and dry. No rash  noted. Not diaphoretic. No erythema. No pallor. CARDIOVASCULAR: Normal heart rate noted, regular rhythm, no murmur. RESPIRATORY: Clear to auscultation bilaterally. Effort and breath sounds normal, no problems with respiration noted. BREASTS: Symmetric in size. No masses, skin changes, nipple drainage, or lymphadenopathy. ABDOMEN: Soft, normal bowel sounds, no distention noted.  No tenderness, rebound or guarding.  BLADDER: Normal PELVIC:  Bladder {:311640}  Urethra: {:311719}  Vulva: {:311722}  Vagina: {:311643}  Cervix: {:311644}  Uterus: {:311718}  Adnexa: {:311645}  RV: {Blank multiple:19196::"External Exam NormaI","No Rectal Masses","Normal Sphincter tone"}  MUSCULOSKELETAL: Normal range of motion. No tenderness.  No cyanosis, clubbing, or edema.  2+ distal pulses. LYMPHATIC: No Axillary, Supraclavicular, or Inguinal Adenopathy.   Labs: Lab Results  Component Value Date   WBC 8.7 11/04/2021   HGB 11.1 (L) 11/04/2021   HCT 35.1 (L) 11/04/2021   MCV 83.4 11/04/2021   PLT 340 11/04/2021    Lab Results  Component Value Date   CREATININE 0.83 11/04/2021   BUN 23 (H) 11/04/2021   NA 138 11/04/2021   K 3.7 11/04/2021   CL 104 11/04/2021   CO2 23 11/04/2021    Lab Results  Component Value Date   ALT 19 11/04/2021   AST 18 11/04/2021   ALKPHOS 97 11/04/2021   BILITOT 0.3 11/04/2021    Lab Results  Component Value Date   CHOL 123 09/19/2021   HDL 37 (L) 09/19/2021   LDLCALC 69 09/19/2021   TRIG 89 09/19/2021   CHOLHDL 3.3 09/19/2021    Lab Results  Component Value Date   TSH 2.40 02/27/2020    Lab Results  Component Value Date   HGBA1C 6.3 (H) 09/19/2021     Assessment:   Annual gynecologic examination 56 y.o. Contraception: {method:5051} {Blank multiple:19196::"Normal BMI","Overweight","Obesity 1","Obesity 2"} Problem List Items Addressed This Visit   None   Plan:  Pap: {Blank multiple:19196::"Pap, Reflex if ASCUS","Pap Co Test","GC/CT  NAAT","Not needed","Not done"} Mammogram: {Blank multiple:19196::"***","Ordered","Not Ordered","Not Indicated"} Stool Guaiac Testing:  {Blank multiple:19196::"***","Ordered","Not Ordered","Not Indicated"} Labs: {Blank multiple:19196::"Lipid 1","FBS","TSH","Hemoglobin A1C","Vit D Level""***"} Routine preventative health maintenance measures emphasized: {Blank multiple:19196::"Exercise/Diet/Weight control","Tobacco Warnings","Alcohol/Substance use risks","Stress Management","Peer Pressure Issues","Safe Sex"} COVID Vaccination status: Declined.  Flu vaccine: Declined flu vaccine.  DM  Return to [place], MD

## 2021-11-23 NOTE — Patient Instructions (Signed)

## 2021-11-28 LAB — CYTOLOGY - PAP: Diagnosis: NEGATIVE

## 2021-12-12 ENCOUNTER — Encounter: Payer: Medicare Other | Admitting: Internal Medicine

## 2021-12-12 NOTE — Progress Notes (Deleted)
HPI:  Patient presents the clinic today for her subsequent annual Medicare wellness exam.  Past Medical History:  Diagnosis Date   Arthritis    SHOULDER   Breast cancer ([place]) 02/2017   rt breast   Breast cancer ([place]) 2018   Cancer ([place]) 02/28/2017   INVASIVE MAMMARY CARCINOMA WITH MUCINOUS FEATURES.   Colonic diverticular abscess 06/21/2017   Colonoscopy 06/25/2017: No evidence of malignancy.   Diabetes mellitus without complication ([place])    Hypertension    Irregular heart beat    PT STATES IT "SKIPS A BEAT"    Obesity    Personal history of chemotherapy    Personal history of radiation therapy     Current Outpatient Medications  Medication Sig Dispense Refill   atorvastatin (LIPITOR) 10 MG tablet Take 1 tablet (10 mg total) by mouth daily. 90 tablet 1   furosemide (LASIX) 20 MG tablet Take 1 tablet (20 mg total) by mouth daily. 3 tablet 0   gabapentin (NEURONTIN) 400 MG capsule Take 1 capsule (400 mg total) by mouth at bedtime. 90 capsule 1   letrozole (FEMARA) 2.5 MG tablet Take 1 tablet (2.5 mg total) by mouth daily. 90 tablet 3   lisinopril (ZESTRIL) 5 MG tablet Take 1 tablet (5 mg total) by mouth daily. 90 tablet 1   metFORMIN (GLUCOPHAGE) 500 MG tablet TAKE 2 TABLETS BY MOUTH ONCE DAILY WITH BREAKFAST AND 1 WITH SUPPER 270 tablet 1   phentermine 37.5 MG capsule Take 1 capsule (37.5 mg total) by mouth every morning. 30 capsule 1   potassium chloride (KLOR-CON) 10 MEQ tablet Take 1 tablet (10 mEq total) by mouth 2 (two) times daily. 180 tablet 1   topiramate (TOPAMAX) 50 MG tablet Take 1 tablet (50 mg total) by mouth daily. 90 tablet 3   triamterene-hydrochlorothiazide (MAXZIDE-25) 37.5-25 MG tablet Take 1 tablet by mouth daily. 90 tablet 1   No current facility-administered medications for this visit.    Allergies  Allergen Reactions   Other Hives and Itching    Patient states that she's allergic to an antibiotic but not sure which one. It was given to her for  infection     Family History  Problem Relation Age of Onset   Diabetes Father    Stroke Father    Hypertension Father    Hypertension Mother    Brain cancer Maternal Aunt 42   Diabetes Sister    Colon cancer Neg Hx    Breast cancer Neg Hx     Social History   Socioeconomic History   Marital status: Married    Spouse name: Not on file   Number of children: Not on file   Years of education: Not on file   Highest education level: Not on file  Occupational History   Not on file  Tobacco Use   Smoking status: Former    Packs/day: 0.25    Years: 25.00    Pack years: 6.25    Types: Cigarettes    Quit date: 10/20/2017    Years since quitting: 4.1   Smokeless tobacco: Former  Scientific laboratory technician Use: Never used  Substance and Sexual Activity   Alcohol use: Not Currently    Comment: occas   Drug use: No   Sexual activity: Not Currently  Other Topics Concern   Not on file  Social History Narrative   Not on file   Social Determinants of Health   Financial Resource Strain: Not on file  Food Insecurity:  Not on file  Transportation Needs: Not on file  Physical Activity: Not on file  Stress: Not on file  Social Connections: Not on file  Intimate Partner Violence: Not on file    Hospitiliaztions: None  Health Maintenance:    Flu:  Tetanus:  Pneumovax:  Prevnar:  Shingrix:  COVID:  Mammogram: 09/2020, scheduled  Pap Smear: 11/2021  Bone Density: 04/2019  Colon Screening: 06/2017  Eye Doctor:  Dental Exam:   Providers:   PCP: Webb Silversmith, NP  Gynecologist: Dr. Marcelline Mates  Oncologist: Dr. Mike Gip   I have personally reviewed and have noted:  1. The patient's medical and social history 2. Their use of alcohol, tobacco or illicit drugs 3. Their current medications and supplements 4. The patient's functional ability including ADL's, fall risks, home safety risks and hearing or visual impairment. 5. Diet and physical activities 6. Evidence for depression or  mood disorder  Subjective:   Review of Systems:   Constitutional: Denies fever, malaise, fatigue, headache or abrupt weight changes.  HEENT: Denies eye pain, eye redness, ear pain, ringing in the ears, wax buildup, runny nose, nasal congestion, bloody nose, or sore throat. Respiratory: Denies difficulty breathing, shortness of breath, cough or sputum production.   Cardiovascular: Denies chest pain, chest tightness, palpitations or swelling in the hands or feet.  Gastrointestinal: Denies abdominal pain, bloating, constipation, diarrhea or blood in the stool.  GU: Denies urgency, frequency, pain with urination, burning sensation, blood in urine, odor or discharge. Musculoskeletal: Patient reports joint pain.  Denies decrease in range of motion, difficulty with gait, muscle pain or joint swelling.  Skin: Denies redness, rashes, lesions or ulcercations.  Neurological: Patient reports neuropathic pain.  Denies dizziness, difficulty with memory, difficulty with speech or problems with balance and coordination.  Psych: Denies anxiety, depression, SI/HI.  No other specific complaints in a complete review of systems (except as listed in HPI above).  Objective:  PE:   LMP 01/31/2014 (Approximate) Comment: LMP was 3 years ago. Wt Readings from Last 3 Encounters:  11/23/21 230 lb (104.3 kg)  11/04/21 223 lb 3.2 oz (101.2 kg)  09/19/21 221 lb 12.8 oz (100.6 kg)    General: Appears her stated age, obese, in NAD. Skin: Warm, dry and intact. No rashes, lesions or ulcerations noted. HEENT: Head: normal shape and size; Eyes: sclera white, no icterus, conjunctiva pink, PERRLA and EOMs intact; Ears: Tm's gray and intact, normal light reflex; Throat/Mouth: Teeth present, mucosa pink and moist, no exudate, lesions or ulcerations noted.  Neck: Neck supple, trachea midline. No masses, lumps or thyromegaly present.  Cardiovascular: Normal rate and rhythm. S1,S2 noted.  No murmur, rubs or gallops noted. No  JVD or BLE edema. No carotid bruits noted. Pulmonary/Chest: Normal effort and positive vesicular breath sounds. No respiratory distress. No wheezes, rales or ronchi noted.  Abdomen: Soft and nontender. Normal bowel sounds. No distention or masses noted. Liver, spleen and kidneys non palpable. Musculoskeletal: Normal range of motion. Strength 5/5 BUE/BLE. No signs of joint swelling.  Neurological: Alert and oriented. Cranial nerves II-XII grossly intact. Coordination normal.  Psychiatric: Mood and affect normal. Behavior is normal. Judgment and thought content normal.     BMET    Component Value Date/Time   NA 138 11/04/2021 1450   K 3.7 11/04/2021 1450   CL 104 11/04/2021 1450   CO2 23 11/04/2021 1450   GLUCOSE 96 11/04/2021 1450   BUN 23 (H) 11/04/2021 1450   CREATININE 0.83 11/04/2021 1450   CREATININE 0.92  09/19/2021 1003   CALCIUM 9.0 11/04/2021 1450   GFRNONAA >60 11/04/2021 1450   GFRNONAA 77 03/22/2020 0802   GFRAA 89 03/22/2020 0802    Lipid Panel     Component Value Date/Time   CHOL 123 09/19/2021 1003   CHOL 168 03/01/2021 1649   TRIG 89 09/19/2021 1003   HDL 37 (L) 09/19/2021 1003   HDL 33 (L) 03/01/2021 1649   CHOLHDL 3.3 09/19/2021 1003   VLDL 23 04/04/2017 0941   LDLCALC 69 09/19/2021 1003    CBC    Component Value Date/Time   WBC 8.7 11/04/2021 1450   RBC 4.21 11/04/2021 1450   HGB 11.1 (L) 11/04/2021 1450   HCT 35.1 (L) 11/04/2021 1450   PLT 340 11/04/2021 1450   MCV 83.4 11/04/2021 1450   MCH 26.4 11/04/2021 1450   MCHC 31.6 11/04/2021 1450   RDW 17.2 (H) 11/04/2021 1450   LYMPHSABS 1.9 11/04/2021 1450   MONOABS 0.4 11/04/2021 1450   EOSABS 0.1 11/04/2021 1450   BASOSABS 0.0 11/04/2021 1450    Hgb A1C Lab Results  Component Value Date   HGBA1C 6.3 (H) 09/19/2021      Assessment and Plan:   Medicare Annual Wellness Visit:  Diet: Heart healthy or DM if diabetic Physical activity: Sedentary Depression/mood screen: Negative, PHQ 9  score of Hearing: Intact to whispered voice Visual acuity: Grossly normal, performs annual eye exam  ADLs: Capable Fall risk: None Home safety: Good Cognitive evaluation: Intact to orientation, naming, recall and repetition EOL planning: Adv directives, full code/ I agree  Preventative Medicine: Pap smear UTD.  Mammogram scheduled.  Bone density UTD.  Colon screening UTD.  Encouraged her to consume a balanced diet and exercise regimen.  Advised her to see an eye doctor and dentist annually.  Will check CBC, c-Met, lipid, A1c, HIV and hep C today.  Due dates for screening exam given the patient's part of her radius.   Next appointment: 6 months, follow-up chronic conditions   Webb Silversmith, NP This visit occurred during the SARS-CoV-2 public health emergency.  Safety protocols were in place, including screening questions prior to the visit, additional usage of staff PPE, and extensive cleaning of exam room while observing appropriate contact time as indicated for disinfecting solutions.

## 2021-12-14 ENCOUNTER — Other Ambulatory Visit: Payer: Self-pay | Admitting: *Deleted

## 2021-12-14 DIAGNOSIS — Z170 Estrogen receptor positive status [ER+]: Secondary | ICD-10-CM

## 2021-12-14 MED ORDER — LETROZOLE 2.5 MG PO TABS: 2.5000 mg | ORAL_TABLET | Freq: Every day | ORAL | 1 refills | Status: DC

## 2021-12-20 ENCOUNTER — Ambulatory Visit (INDEPENDENT_AMBULATORY_CARE_PROVIDER_SITE_OTHER): Payer: Medicaid Other | Admitting: Internal Medicine

## 2021-12-20 ENCOUNTER — Encounter: Payer: Self-pay | Admitting: Internal Medicine

## 2021-12-20 ENCOUNTER — Other Ambulatory Visit: Payer: Self-pay

## 2021-12-20 VITALS — BP 105/71 | HR 72 | Temp 97.1°F | Ht 66.0 in | Wt 227.0 lb

## 2021-12-20 DIAGNOSIS — R7989 Other specified abnormal findings of blood chemistry: Secondary | ICD-10-CM

## 2021-12-20 DIAGNOSIS — E1165 Type 2 diabetes mellitus with hyperglycemia: Secondary | ICD-10-CM

## 2021-12-20 DIAGNOSIS — Z114 Encounter for screening for human immunodeficiency virus [HIV]: Secondary | ICD-10-CM

## 2021-12-20 DIAGNOSIS — Z6836 Body mass index (BMI) 36.0-36.9, adult: Secondary | ICD-10-CM

## 2021-12-20 DIAGNOSIS — G620 Drug-induced polyneuropathy: Secondary | ICD-10-CM

## 2021-12-20 DIAGNOSIS — Z0001 Encounter for general adult medical examination with abnormal findings: Secondary | ICD-10-CM

## 2021-12-20 DIAGNOSIS — Z1159 Encounter for screening for other viral diseases: Secondary | ICD-10-CM | POA: Diagnosis not present

## 2021-12-20 NOTE — Patient Instructions (Signed)
Health Maintenance for Postmenopausal Women ?Menopause is a normal process in which your ability to get pregnant comes to an end. This process happens slowly over many months or years, usually between the ages of 48 and 55. Menopause is complete when you have missed your menstrual period for 12 months. ?It is important to talk with your health care provider about some of the most common conditions that affect women after menopause (postmenopausal women). These include heart disease, cancer, and bone loss (osteoporosis). Adopting a healthy lifestyle and getting preventive care can help to promote your health and wellness. The actions you take can also lower your chances of developing some of these common conditions. ?What are the signs and symptoms of menopause? ?During menopause, you may have the following symptoms: ?Hot flashes. These can be moderate or severe. ?Night sweats. ?Decrease in sex drive. ?Mood swings. ?Headaches. ?Tiredness (fatigue). ?Irritability. ?Memory problems. ?Problems falling asleep or staying asleep. ?Talk with your health care provider about treatment options for your symptoms. ?Do I need hormone replacement therapy? ?Hormone replacement therapy is effective in treating symptoms that are caused by menopause, such as hot flashes and night sweats. ?Hormone replacement carries certain risks, especially as you become older. If you are thinking about using estrogen or estrogen with progestin, discuss the benefits and risks with your health care provider. ?How can I reduce my risk for heart disease and stroke? ?The risk of heart disease, heart attack, and stroke increases as you age. One of the causes may be a change in the body's hormones during menopause. This can affect how your body uses dietary fats, triglycerides, and cholesterol. Heart attack and stroke are medical emergencies. There are many things that you can do to help prevent heart disease and stroke. ?Watch your blood pressure ?High  blood pressure causes heart disease and increases the risk of stroke. This is more likely to develop in people who have high blood pressure readings or are overweight. ?Have your blood pressure checked: ?Every 3-5 years if you are 18-39 years of age. ?Every year if you are 40 years old or older. ?Eat a healthy diet ? ?Eat a diet that includes plenty of vegetables, fruits, low-fat dairy products, and lean protein. ?Do not eat a lot of foods that are high in solid fats, added sugars, or sodium. ?Get regular exercise ?Get regular exercise. This is one of the most important things you can do for your health. Most adults should: ?Try to exercise for at least 150 minutes each week. The exercise should increase your heart rate and make you sweat (moderate-intensity exercise). ?Try to do strengthening exercises at least twice each week. Do these in addition to the moderate-intensity exercise. ?Spend less time sitting. Even light physical activity can be beneficial. ?Other tips ?Work with your health care provider to achieve or maintain a healthy weight. ?Do not use any products that contain nicotine or tobacco. These products include cigarettes, chewing tobacco, and vaping devices, such as e-cigarettes. If you need help quitting, ask your health care provider. ?Know your numbers. Ask your health care provider to check your cholesterol and your blood sugar (glucose). Continue to have your blood tested as directed by your health care provider. ?Do I need screening for cancer? ?Depending on your health history and family history, you may need to have cancer screenings at different stages of your life. This may include screening for: ?Breast cancer. ?Cervical cancer. ?Lung cancer. ?Colorectal cancer. ?What is my risk for osteoporosis? ?After menopause, you may be   at increased risk for osteoporosis. Osteoporosis is a condition in which bone destruction happens more quickly than new bone creation. To help prevent osteoporosis or  the bone fractures that can happen because of osteoporosis, you may take the following actions: ?If you are 19-50 years old, get at least 1,000 mg of calcium and at least 600 international units (IU) of vitamin D per day. ?If you are older than age 50 but younger than age 70, get at least 1,200 mg of calcium and at least 600 international units (IU) of vitamin D per day. ?If you are older than age 70, get at least 1,200 mg of calcium and at least 800 international units (IU) of vitamin D per day. ?Smoking and drinking excessive alcohol increase the risk of osteoporosis. Eat foods that are rich in calcium and vitamin D, and do weight-bearing exercises several times each week as directed by your health care provider. ?How does menopause affect my mental health? ?Depression may occur at any age, but it is more common as you become older. Common symptoms of depression include: ?Feeling depressed. ?Changes in sleep patterns. ?Changes in appetite or eating patterns. ?Feeling an overall lack of motivation or enjoyment of activities that you previously enjoyed. ?Frequent crying spells. ?Talk with your health care provider if you think that you are experiencing any of these symptoms. ?General instructions ?See your health care provider for regular wellness exams and vaccines. This may include: ?Scheduling regular health, dental, and eye exams. ?Getting and maintaining your vaccines. These include: ?Influenza vaccine. Get this vaccine each year before the flu season begins. ?Pneumonia vaccine. ?Shingles vaccine. ?Tetanus, diphtheria, and pertussis (Tdap) booster vaccine. ?Your health care provider may also recommend other immunizations. ?Tell your health care provider if you have ever been abused or do not feel safe at home. ?Summary ?Menopause is a normal process in which your ability to get pregnant comes to an end. ?This condition causes hot flashes, night sweats, decreased interest in sex, mood swings, headaches, or lack  of sleep. ?Treatment for this condition may include hormone replacement therapy. ?Take actions to keep yourself healthy, including exercising regularly, eating a healthy diet, watching your weight, and checking your blood pressure and blood sugar levels. ?Get screened for cancer and depression. Make sure that you are up to date with all your vaccines. ?This information is not intended to replace advice given to you by your health care provider. Make sure you discuss any questions you have with your health care provider. ?Document Revised: 03/28/2021 Document Reviewed: 03/28/2021 ?Elsevier Patient Education ? 2022 Else[employer] Inc[employer] ? ?

## 2021-12-20 NOTE — Progress Notes (Signed)
Subjective:    Patient ID: Chelsea Hudson, female    DOB: 07-Dec-1965, 56 y.o.   MRN: [id number]  HPI  Patient presents to clinic today for her annual exam.  Flu: never Tetanus: unsure COVID: never Pneumovax: never Shingrix: never Pap smear: 11/2021 Mammogram: 09/2020, scheduled Bone density: 04/2021 Colon screening: 06/2017 Vision screening: scheduled, not been in the last year Dentist: biannually  Diet: She does eat meat. She does eat fruits and veggies. She does eat some fried foods. She drinks mostly water, some sweet tea. Exercise: Dance  Review of Systems     Past Medical History:  Diagnosis Date   Arthritis    SHOULDER   Breast cancer ([place]) 02/2017   rt breast   Breast cancer ([place]) 2018   Cancer ([place]) 02/28/2017   INVASIVE MAMMARY CARCINOMA WITH MUCINOUS FEATURES.   Colonic diverticular abscess 06/21/2017   Colonoscopy 06/25/2017: No evidence of malignancy.   Diabetes mellitus without complication ([place])    Hypertension    Irregular heart beat    PT STATES IT "SKIPS A BEAT"    Obesity    Personal history of chemotherapy    Personal history of radiation therapy     Current Outpatient Medications  Medication Sig Dispense Refill   atorvastatin (LIPITOR) 10 MG tablet Take 1 tablet (10 mg total) by mouth daily. 90 tablet 1   furosemide (LASIX) 20 MG tablet Take 1 tablet (20 mg total) by mouth daily. 3 tablet 0   gabapentin (NEURONTIN) 400 MG capsule Take 1 capsule (400 mg total) by mouth at bedtime. 90 capsule 1   letrozole (FEMARA) 2.5 MG tablet Take 1 tablet (2.5 mg total) by mouth daily. 90 tablet 1   lisinopril (ZESTRIL) 5 MG tablet Take 1 tablet (5 mg total) by mouth daily. 90 tablet 1   metFORMIN (GLUCOPHAGE) 500 MG tablet TAKE 2 TABLETS BY MOUTH ONCE DAILY WITH BREAKFAST AND 1 WITH SUPPER 270 tablet 1   phentermine 37.5 MG capsule Take 1 capsule (37.5 mg total) by mouth every morning. 30 capsule 1   potassium chloride (KLOR-CON) 10 MEQ tablet Take 1  tablet (10 mEq total) by mouth 2 (two) times daily. 180 tablet 1   topiramate (TOPAMAX) 50 MG tablet Take 1 tablet (50 mg total) by mouth daily. 90 tablet 3   triamterene-hydrochlorothiazide (MAXZIDE-25) 37.5-25 MG tablet Take 1 tablet by mouth daily. 90 tablet 1   No current facility-administered medications for this visit.    Allergies  Allergen Reactions   Other Hives and Itching    [name]     Family History  Problem Relation Age of Onset   Diabetes Father    Stroke Father    Hypertension Father    Hypertension Mother    Brain cancer Maternal Aunt 59   Diabetes Sister    Colon cancer Neg Hx    Breast cancer Neg Hx     Social History   Socioeconomic History   Marital status: Married    Spouse name: Not on file   Number of children: Not on file   Years of education: Not on file   Highest education level: Not on file  Occupational History   Not on file  Tobacco Use   Smoking status: Former    Packs/day: 0.25    Years: 25.00    Pack years: 6.25    Types: Cigarettes  Quit date: 10/20/2017    Years since quitting: 4.1   Smokeless tobacco: Former  Scientific laboratory technician Use: Never used  Substance and Sexual Activity   Alcohol use: Not Currently    Comment: occas   Drug use: No   Sexual activity: Not Currently  Other Topics Concern   Not on file  Social History Narrative   Not on file   Social Determinants of Health   Financial Resource Strain: Not on file  Food Insecurity: Not on file  Transportation Needs: Not on file  Physical Activity: Not on file  Stress: Not on file  Social Connections: Not on file  Intimate Partner Violence: Not on file     Constitutional: Denies fever, malaise, fatigue, headache or abrupt weight changes.  HEENT: Denies eye pain, eye redness, ear pain, ringing in the ears, wax buildup, runny nose, nasal congestion, bloody nose, or  sore throat. Respiratory: Denies difficulty breathing, shortness of breath, cough or sputum production.   Cardiovascular: Denies chest pain, chest tightness, palpitations or swelling in the hands or feet.  Gastrointestinal: Denies abdominal pain, bloating, constipation, diarrhea or blood in the stool.  GU: Denies urgency, frequency, pain with urination, burning sensation, blood in urine, odor or discharge. Musculoskeletal: Patient reports joint pain.  Denies decrease in range of motion, difficulty with gait, muscle pain or joint swelling.  Skin: Denies redness, rashes, lesions or ulcercations.  Neurological: Patient reports neuropathic pain.  Denies dizziness, difficulty with memory, difficulty with speech or problems with balance and coordination.  Psych: Denies anxiety, depression, SI/HI.  No other specific complaints in a complete review of systems (except as listed in HPI above).  Objective:   Physical Exam  BP 105/71 (BP Location: Left Arm, Patient Position: Sitting, Cuff Size: Large)    Pulse 72    Temp (!) 97.1 F (36.2 C) (Temporal)    Ht 5' 6"  (1.676 m)    Wt 227 lb (103 kg)    LMP 01/31/2014 (Approximate) Comment: LMP was 3 years ago.   SpO2 100%    BMI 36.64 kg/m   Wt Readings from Last 3 Encounters:  11/23/21 230 lb (104.3 kg)  11/04/21 223 lb 3.2 oz (101.2 kg)  09/19/21 221 lb 12.8 oz (100.6 kg)    General: Appears her stated age, obese, in NAD. Skin: Warm, dry and intact. No ulcerations noted. HEENT: Head: normal shape and size; Eyes: sclera white and EOMs intact;  Neck:  Neck supple, trachea midline. No masses, lumps or thyromegaly present.  Cardiovascular: Normal rate and rhythm. S1,S2 noted.  No murmur, rubs or gallops noted. No JVD or BLE edema. No carotid bruits noted. Pulmonary/Chest: Normal effort and positive vesicular breath sounds. No respiratory distress. No wheezes, rales or ronchi noted.  Abdomen: Normal bowel sounds.  Musculoskeletal: Strength 5/5 BUE/BLE.   No difficulty with gait.  Neurological: Alert and oriented. Cranial nerves II-XII grossly intact. Coordination normal.  Psychiatric: Mood and affect normal. Behavior is normal. Judgment and thought content normal.     BMET    Component Value Date/Time   NA 138 11/04/2021 1450   K 3.7 11/04/2021 1450   CL 104 11/04/2021 1450   CO2 23 11/04/2021 1450   GLUCOSE 96 11/04/2021 1450   BUN 23 (H) 11/04/2021 1450   CREATININE 0.83 11/04/2021 1450   CREATININE 0.92 09/19/2021 1003   CALCIUM 9.0 11/04/2021 1450   GFRNONAA >60 11/04/2021 1450   GFRNONAA 77 03/22/2020 0802   GFRAA 89 03/22/2020 0802  Lipid Panel     Component Value Date/Time   CHOL 123 09/19/2021 1003   CHOL 168 03/01/2021 1649   TRIG 89 09/19/2021 1003   HDL 37 (L) 09/19/2021 1003   HDL 33 (L) 03/01/2021 1649   CHOLHDL 3.3 09/19/2021 1003   VLDL 23 04/04/2017 0941   LDLCALC 69 09/19/2021 1003    CBC    Component Value Date/Time   WBC 8.7 11/04/2021 1450   RBC 4.21 11/04/2021 1450   HGB 11.1 (L) 11/04/2021 1450   HCT 35.1 (L) 11/04/2021 1450   PLT 340 11/04/2021 1450   MCV 83.4 11/04/2021 1450   MCH 26.4 11/04/2021 1450   MCHC 31.6 11/04/2021 1450   RDW 17.2 (H) 11/04/2021 1450   LYMPHSABS 1.9 11/04/2021 1450   MONOABS 0.4 11/04/2021 1450   EOSABS 0.1 11/04/2021 1450   BASOSABS 0.0 11/04/2021 1450    Hgb A1C Lab Results  Component Value Date   HGBA1C 6.3 (H) 09/19/2021            Assessment & Plan:   Preventative Health Maintenance:  She declines flu shot Advised her if she gets bit or cut she will need to go get a tetanus vaccine She declines COVID-vaccine She declines Pneumovax Discussed Shingrix, she would like to get this she will go to the pharmacy to get this done Pap smear UTD Mammogram scheduled Bone density UTD Colon screening UTD Encouraged her to consume a balanced diet and exercise regimen Advised her to see an eye doctor and dentist annually We will check CBC, c-Met,  lipid, A1c, HIV and hep C  RTC in 6 months, follow-up chronic conditions Webb Silversmith, NP This visit occurred during the SARS-CoV-2 public health emergency.  Safety protocols were in place, including screening questions prior to the visit, additional usage of staff PPE, and extensive cleaning of exam room while observing appropriate contact time as indicated for disinfecting solutions.

## 2021-12-20 NOTE — Assessment & Plan Note (Signed)
Encourage diet and exercise for weight loss 

## 2021-12-21 LAB — COMPLETE METABOLIC PANEL WITH GFR
AG Ratio: 1.3 (calc) (ref 1.0–2.5)
ALT: 14 U/L (ref 6–29)
AST: 12 U/L (ref 10–35)
Albumin: 4 g/dL (ref 3.6–5.1)
Alkaline phosphatase (APISO): 103 U/L (ref 37–153)
BUN/Creatinine Ratio: 21 (calc) (ref 6–22)
BUN: 26 mg/dL — ABNORMAL HIGH (ref 7–25)
CO2: 25 mmol/L (ref 20–32)
Calcium: 9.8 mg/dL (ref 8.6–10.4)
Chloride: 105 mmol/L (ref 98–110)
Creat: 1.23 mg/dL — ABNORMAL HIGH (ref 0.50–1.03)
Globulin: 3.2 g/dL (calc) (ref 1.9–3.7)
Glucose, Bld: 100 mg/dL — ABNORMAL HIGH (ref 65–99)
Potassium: 4.1 mmol/L (ref 3.5–5.3)
Sodium: 140 mmol/L (ref 135–146)
Total Bilirubin: 0.3 mg/dL (ref 0.2–1.2)
Total Protein: 7.2 g/dL (ref 6.1–8.1)
eGFR: 53 mL/min/{1.73_m2} — ABNORMAL LOW (ref >=60)

## 2021-12-21 LAB — CBC
HCT: 37.1 % (ref 35.0–45.0)
Hemoglobin: 11.8 g/dL (ref 11.7–15.5)
MCH: 26 pg — ABNORMAL LOW (ref 27.0–33.0)
MCHC: 31.8 g/dL — ABNORMAL LOW (ref 32.0–36.0)
MCV: 81.9 fL (ref 80.0–100.0)
MPV: 9.1 fL (ref 7.5–12.5)
Platelets: 341 10*3/uL (ref 140–400)
RBC: 4.53 10*6/uL (ref 3.80–5.10)
RDW: 15.6 % — ABNORMAL HIGH (ref 11.0–15.0)
WBC: 9.2 10*3/uL (ref 3.8–10.8)

## 2021-12-21 LAB — HEMOGLOBIN A1C
Hgb A1c MFr Bld: 6.4 % of total Hgb — ABNORMAL HIGH (ref <5.7)
Mean Plasma Glucose: 137 mg/dL
eAG (mmol/L): 7.6 mmol/L

## 2021-12-21 LAB — LIPID PANEL
Cholesterol: 121 mg/dL (ref <200)
HDL: 34 mg/dL — ABNORMAL LOW (ref >=50)
LDL Cholesterol (Calc): 68 mg/dL (calc)
Non-HDL Cholesterol (Calc): 87 mg/dL (calc) (ref <130)
Total CHOL/HDL Ratio: 3.6 (calc) (ref <5.0)
Triglycerides: 108 mg/dL (ref <150)

## 2021-12-21 LAB — MICROALBUMIN / CREATININE URINE RATIO
Creatinine, Urine: 74 mg/dL (ref 20–275)
Microalb Creat Ratio: 8 mcg/mg creat (ref <30)
Microalb, Ur: 0.6 mg/dL

## 2021-12-21 LAB — HIV ANTIBODY (ROUTINE TESTING W REFLEX): HIV 1&2 Ab, 4th Generation: NONREACTIVE

## 2021-12-21 LAB — HEPATITIS C ANTIBODY
Hepatitis C Ab: NONREACTIVE
SIGNAL TO CUT-OFF: 0.06 (ref <1.00)

## 2021-12-21 NOTE — Addendum Note (Signed)
Addended by: Jearld Fenton on: 12/21/2021 10:11 AM   Modules accepted: Orders

## 2021-12-30 ENCOUNTER — Encounter: Payer: Medicaid Other | Admitting: Obstetrics and Gynecology

## 2022-01-10 DIAGNOSIS — M79674 Pain in right toe(s): Secondary | ICD-10-CM | POA: Diagnosis not present

## 2022-01-10 DIAGNOSIS — B351 Tinea unguium: Secondary | ICD-10-CM | POA: Diagnosis not present

## 2022-01-10 DIAGNOSIS — E119 Type 2 diabetes mellitus without complications: Secondary | ICD-10-CM | POA: Diagnosis not present

## 2022-01-10 DIAGNOSIS — L600 Ingrowing nail: Secondary | ICD-10-CM | POA: Diagnosis not present

## 2022-01-10 DIAGNOSIS — M79675 Pain in left toe(s): Secondary | ICD-10-CM | POA: Diagnosis not present

## 2022-01-10 DIAGNOSIS — L851 Acquired keratosis [keratoderma] palmaris et plantaris: Secondary | ICD-10-CM | POA: Diagnosis not present

## 2022-01-18 ENCOUNTER — Other Ambulatory Visit: Payer: Self-pay

## 2022-01-18 ENCOUNTER — Ambulatory Visit
Admission: RE | Admit: 2022-01-18 | Discharge: 2022-01-18 | Disposition: A | Discharge status: Home / Self Care | Payer: Medicare Other | Source: Ambulatory Visit | Attending: Nurse Practitioner | Admitting: Nurse Practitioner

## 2022-01-18 DIAGNOSIS — Z853 Personal history of malignant neoplasm of breast: Secondary | ICD-10-CM | POA: Diagnosis not present

## 2022-01-18 DIAGNOSIS — Z08 Encounter for follow-up examination after completed treatment for malignant neoplasm: Secondary | ICD-10-CM | POA: Insufficient documentation

## 2022-01-18 DIAGNOSIS — Z1231 Encounter for screening mammogram for malignant neoplasm of breast: Secondary | ICD-10-CM | POA: Diagnosis not present

## 2022-01-19 ENCOUNTER — Encounter: Payer: Medicaid Other | Admitting: Obstetrics and Gynecology

## 2022-02-01 ENCOUNTER — Encounter: Payer: Medicaid Other | Admitting: Obstetrics and Gynecology

## 2022-02-06 ENCOUNTER — Other Ambulatory Visit: Payer: Self-pay | Admitting: Internal Medicine

## 2022-02-06 DIAGNOSIS — E1165 Type 2 diabetes mellitus with hyperglycemia: Secondary | ICD-10-CM

## 2022-02-06 DIAGNOSIS — E1169 Type 2 diabetes mellitus with other specified complication: Secondary | ICD-10-CM

## 2022-02-06 DIAGNOSIS — I10 Essential (primary) hypertension: Secondary | ICD-10-CM

## 2022-02-07 NOTE — Telephone Encounter (Signed)
Requested Prescriptions  ?Pending Prescriptions Disp Refills  ?? triamterene-hydrochlorothiazide (MAXZIDE-25) 37.5-25 MG tablet [Pharmacy Med Name: Triamterene-HCTZ 37.5-25 MG Oral Tablet] 100 tablet 1  ?  Sig: TAKE 1 TABLET BY MOUTH DAILY  ?  ? Cardiovascular: Diuretic Combos Failed - 02/06/2022  6:39 AM  ?  ?  Failed - Cr in normal range and within 180 days  ?  Creat  ?Date Value Ref Range Status  ?12/20/2021 1.23 (H) 0.50 - 1.03 mg/dL Final  ? ?Creatinine, Urine  ?Date Value Ref Range Status  ?12/20/2021 74 20 - 275 mg/dL Final  ?   ?  ?  Passed - K in normal range and within 180 days  ?  Potassium  ?Date Value Ref Range Status  ?12/20/2021 4.1 3.5 - 5.3 mmol/L Final  ?   ?  ?  Passed - Na in normal range and within 180 days  ?  Sodium  ?Date Value Ref Range Status  ?12/20/2021 140 135 - 146 mmol/L Final  ?   ?  ?  Passed - Last BP in normal range  ?  BP Readings from Last 1 Encounters:  ?12/20/21 105/71  ?   ?  ?  Passed - Valid encounter within last 6 months  ?  Recent Outpatient Visits   ?      ? 1 month ago Encounter for general adult medical examination with abnormal findings  ? [facility] [place], [place], NP  ? 4 months ago Controlled type 2 diabetes mellitus with hyperglycemia, without long-term current use of insulin ([place])  ? [facility] [place], [place] W, NP  ? 8 months ago Controlled type 2 diabetes mellitus with hyperglycemia, without long-term current use of insulin ([place])  ? [facility] [place], Coralie Keens, NP  ? 11 months ago Right ear impacted cerumen  ? [place], DO  ? 1 year ago Controlled type 2 diabetes mellitus with hyperglycemia, without long-term current use of insulin ([place])  ? [facility], Lupita Raider, FNP  ?  ?  ?Future Appointments   ?        ? In 4 months Baity, Coralie Keens, NP [facility]  ?  ? ?  ?  ?  ?? atorvastatin (LIPITOR) 10 MG tablet [Pharmacy Med Name:  Atorvastatin Calcium 10 MG Oral Tablet] 100 tablet 2  ?  Sig: TAKE 1 TABLET BY MOUTH DAILY  ?  ? Cardiovascular:  Antilipid - Statins Failed - 02/06/2022  6:39 AM  ?  ?  Failed - Lipid Panel in normal range within the last 12 months  ?  Cholesterol, Total  ?Date Value Ref Range Status  ?03/01/2021 168 100 - 199 mg/dL Final  ? ?Cholesterol  ?Date Value Ref Range Status  ?12/20/2021 121 <200 mg/dL Final  ? ?LDL Cholesterol (Calc)  ?Date Value Ref Range Status  ?12/20/2021 68 mg/dL (calc) Final  ?  Comment:  ?  Reference range: <100 ?Marland Kitchen ?Desirable range <100 mg/dL for primary prevention;   ?<70 mg/dL for patients with CHD or diabetic patients  ?with > or = 2 CHD risk factors. ?. ?LDL-C is now calculated using the Martin-Hopkins  ?calculation, which is a validated novel method providing  ?better accuracy than the Friedewald equation in the  ?estimation of LDL-C.  ?Cresenciano Genre et al. Annamaria Helling. 4801;655(37): 2061-2068  ?(http://education.[employer].com/faq/FAQ164) ?  ? ?HDL  ?Date Value Ref Range Status  ?12/20/2021  34 (L) > OR = 50 mg/dL Final  ?03/01/2021 33 (L) >39 mg/dL Final  ? ?Triglycerides  ?Date Value Ref Range Status  ?12/20/2021 108 <150 mg/dL Final  ? ?  ?  ?  Passed - Patient is not pregnant  ?  ?  Passed - Valid encounter within last 12 months  ?  Recent Outpatient Visits   ?      ? 1 month ago Encounter for general adult medical examination with abnormal findings  ? [facility] [place], [place], NP  ? 4 months ago Controlled type 2 diabetes mellitus with hyperglycemia, without long-term current use of insulin ([place])  ? [facility] [place], [place] W, NP  ? 8 months ago Controlled type 2 diabetes mellitus with hyperglycemia, without long-term current use of insulin ([place])  ? [facility] [place], Coralie Keens, NP  ? 11 months ago Right ear impacted cerumen  ? [place], DO  ? 1 year ago Controlled type 2 diabetes mellitus with  hyperglycemia, without long-term current use of insulin ([place])  ? [facility] [place], Lupita Raider, FNP  ?  ?  ?Future Appointments   ?        ? In 4 months Baity, Coralie Keens, NP [place]  ?  ? ?  ?  ?  ?? lisinopril (ZESTRIL) 5 MG tablet [Pharmacy Med Name: Lisinopril 5 MG Oral Tablet] 100 tablet 1  ?  Sig: TAKE 1 TABLET BY MOUTH DAILY  ?  ? Cardiovascular:  ACE Inhibitors Failed - 02/06/2022  6:39 AM  ?  ?  Failed - Cr in normal range and within 180 days  ?  Creat  ?Date Value Ref Range Status  ?12/20/2021 1.23 (H) 0.50 - 1.03 mg/dL Final  ? ?Creatinine, Urine  ?Date Value Ref Range Status  ?12/20/2021 74 20 - 275 mg/dL Final  ?   ?  ?  Passed - K in normal range and within 180 days  ?  Potassium  ?Date Value Ref Range Status  ?12/20/2021 4.1 3.5 - 5.3 mmol/L Final  ?   ?  ?  Passed - Patient is not pregnant  ?  ?  Passed - Last BP in normal range  ?  BP Readings from Last 1 Encounters:  ?12/20/21 105/71  ?   ?  ?  Passed - Valid encounter within last 6 months  ?  Recent Outpatient Visits   ?      ? 1 month ago Encounter for general adult medical examination with abnormal findings  ? [facility] [place], [place], NP  ? 4 months ago Controlled type 2 diabetes mellitus with hyperglycemia, without long-term current use of insulin ([place])  ? [facility] [place], [place] W, NP  ? 8 months ago Controlled type 2 diabetes mellitus with hyperglycemia, without long-term current use of insulin ([place])  ? [facility] [place], Coralie Keens, NP  ? 11 months ago Right ear impacted cerumen  ? [place], DO  ? 1 year ago Controlled type 2 diabetes mellitus with hyperglycemia, without long-term current use of insulin ([place])  ? [facility], Lupita Raider, FNP  ?  ?  ?Future Appointments   ?        ? In 4 months Baity, Coralie Keens, NP [facility], [place]  ?  ? ?  ?  ?  ??  metFORMIN (GLUCOPHAGE) 500 MG  tablet [Pharmacy Med Name: metFORMIN HCl 500 MG Oral Tablet] 300 tablet 1  ?  Sig: TAKE 2 TABLETS BY MOUTH ONCE  DAILY WITH BREAKFAST AND 1  TABLET BY MOUTH WITH SUPPER  ?  ? Endocrinology:  Diabetes - Biguanides Failed - 02/06/2022  6:39 AM  ?  ?  Failed - Cr in normal range and within 360 days  ?  Creat  ?Date Value Ref Range Status  ?12/20/2021 1.23 (H) 0.50 - 1.03 mg/dL Final  ? ?Creatinine, Urine  ?Date Value Ref Range Status  ?12/20/2021 74 20 - 275 mg/dL Final  ?   ?  ?  Failed - eGFR in normal range and within 360 days  ?  GFR, Est African American  ?Date Value Ref Range Status  ?03/22/2020 89 > OR = 60 mL/min/1.77m Final  ? ?GFR, Est Non African American  ?Date Value Ref Range Status  ?03/22/2020 77 > OR = 60 mL/min/1.791mFinal  ? ?GFR, Estimated  ?Date Value Ref Range Status  ?11/04/2021 >60 >60 mL/min Final  ?  Comment:  ?  (NOTE) ?Calculated using the CKD-EPI Creatinine Equation (2021) ?  ? ?eGFR  ?Date Value Ref Range Status  ?12/20/2021 53 (L) > OR = 60 mL/min/1.7361minal  ?  Comment:  ?  The eGFR is based on the CKD-EPI 2021 equation. To calculate  ?the new eGFR from a previous Creatinine or Cystatin C ?result, go to https://www.kidney.org/professionals/ ?kdoqi/gfr%5Fcalculator ?  ?   ?  ?  Passed - HBA1C is between 0 and 7.9 and within 180 days  ?  Hgb A1c MFr Bld  ?Date Value Ref Range Status  ?12/20/2021 6.4 (H) <5.7 % of total Hgb Final  ?  Comment:  ?  For someone without known diabetes, a hemoglobin  ?A1c value between 5.7% and 6.4% is consistent with ?prediabetes and should be confirmed with a  ?follow-up test. ?. ?For someone with known diabetes, a value <7% ?indicates that their diabetes is well controlled. A1c ?targets should be individualized based on duration of ?diabetes, age, comorbid conditions, and other ?considerations. ?. ?This assay result is consistent with an increased risk ?of diabetes. ?. ?Currently, no consensus exists regarding use of ?hemoglobin A1c for diagnosis of  diabetes for children. ?. ?  ?   ?  ?  Passed - B12 Level in normal range and within 720 days  ?  Vitamin B-12  ?Date Value Ref Range Status  ?05/03/2021 237 180 - 914 pg/mL Final  ?  Comment:  ?  (NOTE) ?This assay is

## 2022-02-16 ENCOUNTER — Other Ambulatory Visit: Payer: Self-pay | Admitting: Internal Medicine

## 2022-02-16 DIAGNOSIS — T451X5A Adverse effect of antineoplastic and immunosuppressive drugs, initial encounter: Secondary | ICD-10-CM

## 2022-02-16 DIAGNOSIS — E876 Hypokalemia: Secondary | ICD-10-CM

## 2022-02-17 NOTE — Telephone Encounter (Signed)
Requested medications are due for refill today.  Unsure  ? ?Requested medications are on the active medications list.  yes ? ?Last refill. Gabapentin 12/20/2021 #180 1 refill - marked as historical medication. Kcl 11/16/2021 #180 1 refill ? ?Future visit scheduled.   yes ? ?Notes to clinic.  Gabapentin sig on med list does not match rx. Please review. Pharmacy is requesting 1 year supply. ? ? ? ?Requested Prescriptions  ?Pending Prescriptions Disp Refills  ? gabapentin (NEURONTIN) 400 MG capsule [Pharmacy Med Name: Gabapentin 400 MG Oral Capsule] 100 capsule 2  ?  Sig: TAKE 1 CAPSULE BY MOUTH AT  BEDTIME  ?  ? Neurology: Anticonvulsants - gabapentin Failed - 02/16/2022  7:11 PM  ?  ?  Failed - Cr in normal range and within 360 days  ?  Creat  ?Date Value Ref Range Status  ?12/20/2021 1.23 (H) 0.50 - 1.03 mg/dL Final  ? ?Creatinine, Urine  ?Date Value Ref Range Status  ?12/20/2021 74 20 - 275 mg/dL Final  ?  ?  ?  ?  Passed - Completed PHQ-2 or PHQ-9 in the last 360 days  ?  ?  Passed - Valid encounter within last 12 months  ?  Recent Outpatient Visits   ? ?      ? 1 month ago Encounter for general adult medical examination with abnormal findings  ? [facility] [place], [place], NP  ? 5 months ago Controlled type 2 diabetes mellitus with hyperglycemia, without long-term current use of insulin ([place])  ? [facility] [place], [place], NP  ? 8 months ago Controlled type 2 diabetes mellitus with hyperglycemia, without long-term current use of insulin ([place])  ? [facility] [place], Coralie Keens, NP  ? 11 months ago Right ear impacted cerumen  ? [place], DO  ? 1 year ago Controlled type 2 diabetes mellitus with hyperglycemia, without long-term current use of insulin ([place])  ? [facility], Lupita Raider, FNP  ? ?  ?  ?Future Appointments   ? ?        ? In 4 months Baity, Coralie Keens, NP [facility]  ? ?  ? ?  ?   ?  ? potassium chloride (KLOR-CON) 10 MEQ tablet [Pharmacy Med Name: Potassium Chloride ER 10 MEQ Oral Tablet Extended Release] 200 tablet 2  ?  Sig: TAKE 1 TABLET BY MOUTH TWICE  DAILY  ?  ? Endocrinology:  Minerals - Potassium Supplementation Failed - 02/16/2022  7:11 PM  ?  ?  Failed - Cr in normal range and within 360 days  ?  Creat  ?Date Value Ref Range Status  ?12/20/2021 1.23 (H) 0.50 - 1.03 mg/dL Final  ? ?Creatinine, Urine  ?Date Value Ref Range Status  ?12/20/2021 74 20 - 275 mg/dL Final  ?  ?  ?  ?  Passed - K in normal range and within 360 days  ?  Potassium  ?Date Value Ref Range Status  ?12/20/2021 4.1 3.5 - 5.3 mmol/L Final  ?  ?  ?  ?  Passed - Valid encounter within last 12 months  ?  Recent Outpatient Visits   ? ?      ? 1 month ago Encounter for general adult medical examination with abnormal findings  ? [facility] [place], [place], NP  ? 5 months ago Controlled type 2 diabetes mellitus with  hyperglycemia, without long-term current use of insulin ([place])  ? [facility] [place], [place], NP  ? 8 months ago Controlled type 2 diabetes mellitus with hyperglycemia, without long-term current use of insulin ([place])  ? [facility] [place], Coralie Keens, NP  ? 11 months ago Right ear impacted cerumen  ? [place], DO  ? 1 year ago Controlled type 2 diabetes mellitus with hyperglycemia, without long-term current use of insulin ([place])  ? [facility], Lupita Raider, FNP  ? ?  ?  ?Future Appointments   ? ?        ? In 4 months Baity, Coralie Keens, NP [facility] [place], [place]  ? ?  ? ?  ?  ?  ?  ?

## 2022-03-06 ENCOUNTER — Ambulatory Visit (INDEPENDENT_AMBULATORY_CARE_PROVIDER_SITE_OTHER): Payer: Medicare Other

## 2022-03-06 DIAGNOSIS — Z0000 Encounter for general adult medical examination without abnormal findings: Secondary | ICD-10-CM | POA: Diagnosis not present

## 2022-03-06 NOTE — Progress Notes (Signed)
? ?I connected with  Chelsea Hudson today by telephone and verified that I am speaking with the correct person using two identifiers. ?Location patient: home ?Location provider: work ?Persons participating in the virtual visit:  Chelsea Hudson, Chelsea [place], CNA, CMA  ?  ?I discussed the limitations, risks, security and privacy concerns of performing an evaluation and management service by telephone and the availability of in person appointments. I also discussed with the patient that there may be a patient responsible charge related to this service. The patient expressed understanding and verbally consented to this telephonic visit.  ?  ? ?Subjective:  ? Chelsea Hudson is a 56 y.o. female who presents for Medicare Annual (Subsequent) preventive examination. ? ?Review of Systems    ?Per HPI unless specifically indicated below   ?  ? ?   ?Objective:  ?  ?There were no vitals filed for this visit. ?There is no height or weight on file to calculate BMI. ? ? ?  05/03/2021  ?  2:19 PM 10/28/2020  ?  3:23 PM 08/24/2020  ?  4:19 PM 08/03/2020  ?  6:00 PM 02/23/2020  ?  2:41 PM 10/14/2019  ? 11:55 AM 06/12/2019  ?  8:45 AM  ?Advanced Directives  ?Does Patient Have a Medical Advance Directive? No No No No No No No  ?Would patient like information on creating a medical advance directive? No - Patient declined No - Patient declined No - Patient declined No - Patient declined No - Patient declined No - Patient declined No - Patient declined  ? ? ?Current Medications (verified) ?Outpatient Encounter Medications as of 03/06/2022  ?Medication Sig  ? atorvastatin (LIPITOR) 10 MG tablet TAKE 1 TABLET BY MOUTH DAILY  ? gabapentin (NEURONTIN) 400 MG capsule TAKE 1 CAPSULE BY MOUTH AT  BEDTIME  ? letrozole (FEMARA) 2.5 MG tablet Take 1 tablet (2.5 mg total) by mouth daily.  ? lisinopril (ZESTRIL) 5 MG tablet TAKE 1 TABLET BY MOUTH DAILY  ? metFORMIN (GLUCOPHAGE) 500 MG tablet TAKE 2 TABLETS BY MOUTH ONCE  DAILY WITH BREAKFAST AND 1  TABLET  BY MOUTH WITH SUPPER  ? potassium chloride (KLOR-CON) 10 MEQ tablet TAKE 1 TABLET BY MOUTH TWICE  DAILY  ? triamterene-hydrochlorothiazide (MAXZIDE-25) 37.5-25 MG tablet TAKE 1 TABLET BY MOUTH DAILY  ? furosemide (LASIX) 20 MG tablet Take 1 tablet (20 mg total) by mouth daily. (Patient not taking: Reported on 03/06/2022)  ? [DISCONTINUED] phentermine 37.5 MG capsule Take 1 capsule (37.5 mg total) by mouth every morning. (Patient not taking: Reported on 03/06/2022)  ? [DISCONTINUED] topiramate (TOPAMAX) 50 MG tablet Take 1 tablet (50 mg total) by mouth daily. (Patient not taking: Reported on 03/06/2022)  ? ?No facility-administered encounter medications on file as of 03/06/2022.  ? ? ?Allergies (verified) ?Other  ? ?History: ?Past Medical History:  ?Diagnosis Date  ? 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 Breast cancer ([place]) 02/2017  ? rt breast  ? Breast cancer ([place]) 2018  ? Cancer ([place]) 02/28/2017  ? INVASIVE MAMMARY CARCINOMA WITH MUCINOUS FEATURES.  ? Colonic diverticular abscess 06/21/2017  ? Colonoscopy 06/25/2017: No evidence of malignancy.  ? Diabetes mellitus without complication ([place])   ? Hypertension   ? Irregular heart beat   ? PT STATES IT "SKIPS A BEAT"   ? Obesity   ? Personal history of chemotherapy   ? Personal history of radiation therapy   ? ?Past Surgical History:  ?Procedure Laterality Date  ? ANKLE SURGERY    ?  BREAST BIOPSY Right 02/28/2017  ? INVASIVE MAMMARY CARCINOMA WITH MUCINOUS FEATURES.  ? BREAST CYST ASPIRATION Right   ? NEG  ? COLONOSCOPY WITH PROPOFOL N/A 06/25/2017  ? Procedure: COLONOSCOPY WITH PROPOFOL;  Surgeon: Jonathon Bellows, MD;  Location: [facility];  Service: Gastroenterology;  Laterality: N/A;  ? MASTECTOMY, PARTIAL Right 07/30/2017  ? Procedure: MASTECTOMY PARTIAL;  Surgeon: Robert Bellow, MD;  Location: [facility] ORS;  Service: General;  Laterality: Right;  ? PORTACATH PLACEMENT Left 03/15/2017  ? Procedure: INSERTION PORT-A-CATH;  Surgeon: Robert Bellow, MD;  Location: [facility] ORS;   Service: General;  Laterality: Left;  ? RE-EXCISION OF BREAST LUMPECTOMY Right 08/17/2017  ?  INVASIVE CARCINOMA EXTENDS TO NEW LATERAL MARGIN. /RE-EXCISION OF BREAST LUMPECTOMY;: Byrnett, Forest Gleason, MD;  [facility] ORS; General;  Laterality: Right;  ? SENTINEL NODE BIOPSY Right 07/30/2017  ? Procedure: SENTINEL NODE BIOPSY;  Surgeon: Robert Bellow, MD;  Location: [facility] ORS;  Service: General;  Laterality: Right;  ? SIMPLE MASTECTOMY WITH AXILLARY SENTINEL NODE BIOPSY Right 09/28/2017  ? Procedure: SIMPLE MASTECTOMY;  Surgeon: Robert Bellow, MD;  Location: [facility] ORS;  Service: General;  Laterality: Right;  ? ?Family History  ?Problem Relation Age of Onset  ? Diabetes Father   ? Stroke Father   ? Hypertension Father   ? Hypertension Mother   ? Brain cancer Maternal Aunt 60  ? Diabetes Sister   ? Colon cancer Neg Hx   ? Breast cancer Neg Hx   ? ?Social History  ? ?Socioeconomic History  ? Marital status: Married  ?  Spouse name: Not on file  ? Number of children: Not on file  ? Years of education: Not on file  ? Highest education level: Not on file  ?Occupational History  ? Not on file  ?Tobacco Use  ? Smoking status: Former  ?  Packs/day: 0.25  ?  Years: 25.00  ?  Pack years: 6.25  ?  Types: Cigarettes  ?  Quit date: 10/20/2017  ?  Years since quitting: 4.3  ? Smokeless tobacco: Former  ?Vaping Use  ? Vaping Use: Never used  ?Substance and Sexual Activity  ? Alcohol use: Not Currently  ?  Comment: occas  ? Drug use: No  ? Sexual activity: Not Currently  ?Other Topics Concern  ? Not on file  ?Social History Narrative  ? Not on file  ? ?Social Determinants of Health  ? ?Financial Resource Strain: Low Risk   ? Difficulty of Paying Living Expenses: Not hard at all  ?Food Insecurity: No Food Insecurity  ? Worried About Charity fundraiser in the Last Year: Never true  ? Ran Out of Food in the Last Year: Never true  ?Transportation Needs: No Transportation Needs  ? Lack of Transportation (Medical): No  ? Lack of  Transportation (Non-Medical): No  ?Physical Activity: Sufficiently Active  ? Days of Exercise per Week: 3 days  ? Minutes of Exercise per Session: 60 min  ?Stress: No Stress Concern Present  ? Feeling of Stress : Not at all  ?Social Connections: Socially Integrated  ? Frequency of Communication with Friends and Family: More than three times a week  ? Frequency of Social Gatherings with Friends and Family: Twice a week  ? Attends Religious Services: More than 4 times per year  ? Active Member of Clubs or Organizations: Yes  ? Attends Archivist Meetings: More than 4 times per year  ? Marital Status: Married  ? ? ?Tobacco  Counseling ?Counseling given: Not Answered ? ? ?Clinical Intake: ? ?Pre-visit preparation completed: No ? ?Pain : No/denies pain ? ?  ? ?Nutritional Status: BMI 25 -29 Overweight ?Nutritional Risks: None ?Diabetes: Yes ?CBG done?: No ?CBG resulted in Enter/ Edit results?: No ?Did pt. bring in CBG monitor from home?: No ? ?How often do you need to have someone help you when you read instructions, pamphlets, or other written materials from your doctor or pharmacy?: 1 - Never ? ?Diabetic? Yes ? ?Interpreter Needed?: No ? ?Information entered by :: Chelsea Hudson, CMA ? ? ?Activities of Daily Living ? ?  03/06/2022  ? 10:36 AM 12/20/2021  ?  3:36 PM  ?In your present state of health, do you have any difficulty performing the following activities:  ?Hearing? 0 0  ?Vision? 1 0  ?Difficulty concentrating or making decisions? 1 0  ?Walking or climbing stairs? 0 0  ?Dressing or bathing? 0 0  ?Doing errands, shopping? 0 0  ? ? ?Patient Care Team: ?Jearld Fenton, NP as PCP - General (Internal Medicine) ?[name], RN as Registered Nurse ?[name], RN as Registered Nurse ?Robert Bellow, MD (General Surgery) ?Beckey Rutter, NP         Onocology ?[place], [place] ? ?Indicate any recent Medical Services you may have received from other than [facility] providers in the past  year (date may be approximate). ?No hospitalization in the past 12 months. ?   ?Assessment:  ? This is a routine wellness examination for [place]. ? ?Hearing/Vision screen ?No results found. ? ?Dietary issues and exercise

## 2022-03-06 NOTE — Patient Instructions (Signed)
Screening for Type 2 Diabetes ? ?A screening test for type 2 diabetes (type 2 diabetes mellitus) is a blood test to measure your blood sugar (glucose) level. This test is done to check for early signs of diabetes, before you develop symptoms.  ?Type 2 diabetes is a long-term (chronic) disease. In type 2 diabetes, one or both of these problems may be present: ?The pancreas does not make enough of a hormone called insulin. ?Cells in the body do not respond properly to insulin that the body makes (insulin resistance). ?Normally, insulin allows blood sugar (glucose) to enter cells in the body. The cells use glucose for energy. Insulin resistance or lack of insulin causes excess glucose to build up in the blood instead of going into cells. This results in high blood glucose levels (hyperglycemia), which can cause many complications. ?You may be screened for type 2 diabetes as part of your regular health care, especially if you have a high risk for diabetes. Screening can help to identify type 2 diabetes at its early stage (prediabetes). Identifying and treating prediabetes may delay or prevent the development of type 2 diabetes. ?Tell a health care provider about: ?All medicines you are taking, including vitamins, herbs, eye drops, creams, and over-the-counter medicines. ?Any bleeding problems you have. ?Any medical conditions you have. ?Whether you are pregnant or may be pregnant. ?Who should be screened for type 2 diabetes? ?Adults ?Adults age 75 and older. These adults should be screened once every three years. ?Adults who are any age, are overweight, and have one other risk factor. These adults should be screened once every three years. ?Adults who have normal blood glucose levels and two or more risk factors. These adults may be screened once every year (annually). ?Women who have had gestational diabetes in the past. These women should be screened once every three years. ?Pregnant women who have risk factors. These  women should be screened at their first prenatal visit and again between weeks 24 and 28 of pregnancy. ?Children and adolescents ?Children and adolescents should be screened for type 2 diabetes if they are overweight and have any of the following risk factors: ?A family history of type 2 diabetes. ?Being a member of a high-risk ethnic group. ?Signs of insulin resistance or conditions that are associated with insulin resistance. ?A mother who had gestational diabetes while pregnant. ?Screening should be done at least once every three years, starting at age 63 or at the onset of puberty, whichever comes first. ?Your health care provider or your child's health care provider may recommend having a screening more or less often. ?What are the risk factors for type 2 diabetes? ?The following are factors that may make you more likely to develop type 2 diabetes and can be modified: ?Not getting enough exercise. ?Having high blood pressure. ?Having low levels of good cholesterol (HDL-C) or high levels of blood fats (triglycerides). ?Having high blood glucose in a previous blood test. ?Being overweight or obese. ?The following are factors that may make you more likely to develop type 2 diabetes and can not be modified: ?Having a parent or sibling (first-degree relative) who has diabetes. ?Being of American-Indian, African-American, Hispanic/Latino, Asian, or [place] descent. ?Being older than age 85. ?Having a history of diabetes during pregnancy (gestational diabetes). ?Having certain diseases or conditions that may be caused by insulin resistance, including: ?Acanthosis nigricans. This is a condition that causes dark skin on the neck, armpits, and groin. ?Polycystic ovary syndrome (PCOS). ?Cardiovascular heart disease. ?What happens  during screening? ?During screening, your health care provider may ask questions about: ?Your health and your risk factors, including your activity level and any medical conditions that  you have. ?The health of your first-degree relatives. ?Past pregnancies, if this applies. ?Your health care provider will also do a physical exam, including a blood pressure measurement and blood tests. There are four blood tests that can be used to screen for type 2 diabetes. You may have one or more of the following: ?A fasting blood glucose (FBG) test. You will not be allowed to eat (you will fast) for 8 hours or more before a blood sample is taken. ?A random blood glucose test. This test checks your blood glucose at any time of the day regardless of when you ate. ?An oral glucose tolerance test (OGTT). This test measures your blood glucose at two times: ?After you have not eaten (have fasted) overnight. This is your baseline glucose level. ?Two hours after you drink a glucose-containing beverage. ?An A1C (hemoglobin A1C) blood test. This test provides information about blood glucose control over the previous 2-3 months. ?What do the results mean? ?Your test results are a measurement of how much glucose is in your blood. Normal blood glucose levels mean that you do not have diabetes or prediabetes. High blood glucose levels may mean that you have prediabetes or diabetes. Depending on the results, other tests may be needed to confirm the diagnosis. ?You may be diagnosed with type 2 diabetes if: ?Your FBG level is 126 mg/dL (7.0 mmol/L) or higher. ?Your random blood glucose level is 200 mg/dL (11.1 mmol/L) or higher. ?Your A1C level is 6.5% or higher. ?Your OGTT result is higher than 200 mg/dL (11.1 mmol/L). ?These blood tests may be repeated to confirm your diagnosis. Talk with your health care provider about what your results mean. ?Summary ?A screening test for type 2 diabetes (type 2 diabetes mellitus) is a blood test to measure your blood sugar (glucose) level. ?Know what your risk factors are for developing type 2 diabetes. ?If you are at risk, get screening tests as often as told by your health care  provider. ?Screening may help you identify type 2 diabetes at its early stage (prediabetes). Identifying and treating prediabetes may delay or prevent the development of type 2 diabetes. ?This information is not intended to replace advice given to you by your health care provider. Make sure you discuss any questions you have with your health care provider. ?Document Revised: 01/31/2021 Document Reviewed: 01/31/2021 ?Elsevier Patient Education ? [place]. ? ?Fall Prevention in the Home, Adult ?Falls can cause injuries and can happen to people of all ages. There are many things you can do to make your home safe and to help prevent falls. Ask for help when making these changes. ?What actions can I take to prevent falls? ?General Instructions ?Use good lighting in all rooms. Replace any light bulbs that burn out. ?Turn on the lights in dark areas. Use night-lights. ?Keep items that you use often in easy-to-reach places. Lower the shelves around your home if needed. ?Set up your furniture so you have a clear path. Avoid moving your furniture around. ?Do not have throw rugs or other things on the floor that can make you trip. ?Avoid walking on wet floors. ?If any of your floors are uneven, fix them. ?Add color or contrast paint or tape to clearly mark and help you see: ?Grab bars or handrails. ?First and last steps of staircases. ?Where the edge of  each step is. ?If you use a stepladder: ?Make sure that it is fully opened. Do not climb a closed stepladder. ?Make sure the sides of the stepladder are locked in place. ?Ask someone to hold the stepladder while you use it. ?Know where your pets are when moving through your home. ?What can I do in the bathroom? ? ?  ? ?Keep the floor dry. Clean up any water on the floor right away. ?Remove soap buildup in the tub or shower. ?Use nonskid mats or decals on the floor of the tub or shower. ?Attach bath mats securely with double-sided, nonslip rug tape. ?If you need to sit  down in the shower, use a plastic, nonslip stool. ?Install grab bars by the toilet and in the tub and shower. Do not use towel bars as grab bars. ?What can I do in the bedroom? ?Make sure that you have a lig

## 2022-05-02 ENCOUNTER — Encounter: Payer: Self-pay | Admitting: Family Medicine

## 2022-05-02 ENCOUNTER — Ambulatory Visit (INDEPENDENT_AMBULATORY_CARE_PROVIDER_SITE_OTHER): Payer: Medicare Other | Admitting: Family Medicine

## 2022-05-02 ENCOUNTER — Ambulatory Visit: Payer: Self-pay | Admitting: *Deleted

## 2022-05-02 VITALS — BP 108/63 | HR 98 | Ht 66.0 in | Wt 241.4 lb

## 2022-05-02 DIAGNOSIS — M25511 Pain in right shoulder: Secondary | ICD-10-CM | POA: Diagnosis not present

## 2022-05-02 MED ORDER — NAPROXEN 500 MG PO TABS: 500.0000 mg | ORAL_TABLET | Freq: Two times a day (BID) | ORAL | 1 refills | Status: DC

## 2022-05-02 MED ORDER — BACLOFEN 10 MG PO TABS: 5.0000 mg | ORAL_TABLET | Freq: Two times a day (BID) | ORAL | 1 refills | Status: DC | PRN

## 2022-05-02 NOTE — Patient Instructions (Addendum)
Thank you for coming to the office today.  Recommend trial of Anti-inflammatory with Naproxen (Naprosyn) '500mg'$  tabs - take one with food and plenty of water TWICE daily every day (breakfast and dinner), for next 1 to 2 weeks, then you may take only as needed - DO NOT TAKE any ibuprofen, aleve, motrin while you are taking this medicine  - It is safe to take Tylenol Ext Str '500mg'$  tabs - take 1 to 2 (max dose '1000mg'$ ) every 6 hours as needed for breakthrough pain, max 24 hour daily dose is 6 to 8 tablets or '4000mg'$   Start taking Baclofen (Lioresal) '10mg'$  (muscle relaxant) - start with half (cut) to one whole pill at night as needed for next 1-3 nights (may make you drowsy, caution with driving) see how it affects you, then if tolerated increase to one pill 2 to 3 times a day or (every 8 hours as needed)   - Try spoonful of yellow mustard to relieve leg cramps or try daily to prevent the problem  - OTC natural option is Hyland's Leg Cramps (Dissolving tablet) take as needed for muscle cramps  Please schedule a Follow-up Appointment to: Return if symptoms worsen or fail to improve.  If you have any other questions or concerns, please feel free to call the office or send a message through Welcome. You may also schedule an earlier appointment if necessary.  Additionally, you may be receiving a survey about your experience at our office within a few days to 1 week by e-mail or mail. We value your feedback.  Nobie Putnam, DO [place]

## 2022-05-02 NOTE — Progress Notes (Signed)
Subjective:    Patient ID: Chelsea Hudson, female    DOB: 01-24-1966, 56 y.o.   MRN: [id number]  DORTHEA Hudson is a 56 y.o. female presenting on 05/02/2022 for Arm Pain  PCP Webb Silversmith, FNP   Patient presents for a same day appointment.  HPI   R Shoulder vs Right Arm Pain Right upper arm pain for 3 weeks. Gradually worsening severity up to 9 out of 10 with movement and activity. R upper posterior arm and shoulder pain. No known inciting injury that she is aware of.   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 Denies any numbness or tingling or weakness.  Right sided breast cancer. 2018 dx She did initial chemotherapy. Then did not get all lymph nodes out. She had to do another surgery at [facility] and then Radiation therapy in 2019  She has done Physical Therapy and she had weakness on Right side. Not having pain.  If stretches she feels cramping episodes occur in arm and legs as well.      05/02/2022    4:28 PM 03/06/2022   10:37 AM 12/20/2021    3:36 PM  Depression screen PHQ 2/9  Decreased Interest 1 0 0  Down, Depressed, Hopeless 0 2 0  PHQ - 2 Score 1 2 0  Altered sleeping 0 0 0  Tired, decreased energy 1 0 0  Change in appetite 0 1 0  Feeling bad or failure about yourself  0 0 0  Trouble concentrating 0 1 0  Moving slowly or fidgety/restless 0 0 0  Suicidal thoughts 0 0 0  PHQ-9 Score 2 4 0  Difficult doing work/chores Not difficult at all Not difficult at all Not difficult at all    Social History   Tobacco Use   Smoking status: Former    Packs/day: 0.25    Years: 25.00    Total pack years: 6.25    Types: Cigarettes    Quit date: 10/20/2017    Years since quitting: 4.5   Smokeless tobacco: Former  Scientific laboratory technician Use: Never used  Substance Use Topics   Alcohol use: Not Currently    Comment: occas   Drug use: No    Review of Systems Per HPI unless specifically indicated above      Objective:    BP 108/63   Pulse 98   Ht 5' 6"  (1.676 m)   Wt 241 lb 6.4 oz (109.5 kg)   LMP 01/31/2014 (Approximate) Comment: LMP was 3 years ago.  SpO2 99%   BMI 38.96 kg/m   Wt Readings from Last 3 Encounters:  05/02/22 241 lb 6.4 oz (109.5 kg)  12/20/21 227 lb (103 kg)  11/23/21 230 lb (104.3 kg)    Physical Exam Vitals and nursing note reviewed.  Constitutional:      General: She is not in acute distress.    Appearance: Normal appearance. She is well-developed. She is obese. She is not diaphoretic.     Comments: Well-appearing, comfortable, cooperative  HENT:     Head: Normocephalic and atraumatic.  Eyes:     General:        Right eye: No discharge.        Left eye: No discharge.     Conjunctiva/sclera: Conjunctivae normal.  Cardiovascular:     Rate and Rhythm: Normal rate.  Pulmonary:     Effort: Pulmonary effort is normal.  Musculoskeletal:  Comments: Right shoulder with pain on forward flex and abduction. Has full range of motion fingers and forearm. Positive impingement symptoms. Intact sensation distal  Skin:    General: Skin is warm and dry.     Findings: No erythema or rash.  Neurological:     Mental Status: She is alert and oriented to person, place, and time.  Psychiatric:        Mood and Affect: Mood normal.        Behavior: Behavior normal.        Thought Content: Thought content normal.     Comments: Well groomed, good eye contact, normal speech and thoughts       Results for orders placed or performed in visit on 12/20/21  CBC  Result Value Ref Range   WBC 9.2 3.8 - 10.8 Thousand/uL   RBC 4.53 3.80 - 5.10 Million/uL   Hemoglobin 11.8 11.7 - 15.5 g/dL   HCT 37.1 35.0 - 45.0 %   MCV 81.9 80.0 - 100.0 fL   MCH 26.0 (L) 27.0 - 33.0 pg   MCHC 31.8 (L) 32.0 - 36.0 g/dL   RDW 15.6 (H) 11.0 - 15.0 %   Platelets 341 140 - 400 Thousand/uL   MPV 9.1 7.5 - 12.5 fL  COMPLETE METABOLIC PANEL WITH GFR  Result Value Ref Range   Glucose, Bld 100  (H) 65 - 99 mg/dL   BUN 26 (H) 7 - 25 mg/dL   Creat 1.23 (H) 0.50 - 1.03 mg/dL   eGFR 53 (L) > OR = 60 mL/min/1.93m   BUN/Creatinine Ratio 21 6 - 22 (calc)   Sodium 140 135 - 146 mmol/L   Potassium 4.1 3.5 - 5.3 mmol/L   Chloride 105 98 - 110 mmol/L   CO2 25 20 - 32 mmol/L   Calcium 9.8 8.6 - 10.4 mg/dL   Total Protein 7.2 6.1 - 8.1 g/dL   Albumin 4.0 3.6 - 5.1 g/dL   Globulin 3.2 1.9 - 3.7 g/dL (calc)   AG Ratio 1.3 1.0 - 2.5 (calc)   Total Bilirubin 0.3 0.2 - 1.2 mg/dL   Alkaline phosphatase (APISO) 103 37 - 153 U/L   AST 12 10 - 35 U/L   ALT 14 6 - 29 U/L  Lipid panel  Result Value Ref Range   Cholesterol 121 <200 mg/dL   HDL 34 (L) > OR = 50 mg/dL   Triglycerides 108 <150 mg/dL   LDL Cholesterol (Calc) 68 mg/dL (calc)   Total CHOL/HDL Ratio 3.6 <5.0 (calc)   Non-HDL Cholesterol (Calc) 87 <130 mg/dL (calc)  Hemoglobin A1c  Result Value Ref Range   Hgb A1c MFr Bld 6.4 (H) <5.7 % of total Hgb   Mean Plasma Glucose 137 mg/dL   eAG (mmol/L) 7.6 mmol/L  Microalbumin / creatinine urine ratio  Result Value Ref Range   Creatinine, Urine 74 20 - 275 mg/dL   Microalb, Ur 0.6 mg/dL   Microalb Creat Ratio 8 <30 mcg/mg creat  Hepatitis C antibody  Result Value Ref Range   Hepatitis C Ab NON-REACTIVE NON-REACTIVE   SIGNAL TO CUT-OFF 0.06 <1.00  HIV Antibody (routine testing w rflx)  Result Value Ref Range   HIV 1&2 Ab, 4th Generation NON-REACTIVE NON-REACTIVE   I have personally reviewed the radiology report from 08/25/19 on X-ry R Shoulder.  Narrative & Impression  CLINICAL DATA:  Postmastectomy pain.   EXAM: RIGHT SHOULDER - 2+ VIEW   COMPARISON:  None.   FINDINGS: There is no evidence  of fracture or dislocation. There is no evidence of arthropathy or other focal bone abnormality. Soft tissues are unremarkable.   IMPRESSION: Negative.     Electronically Signed   By: Marijo Conception M.D.   On: 08/25/2019 16:14      Assessment & Plan:   Problem List Items  Addressed This Visit   None Visit Diagnoses     Acute pain of right shoulder    -  Primary   Relevant Medications   naproxen (NAPROSYN) 500 MG tablet   baclofen (LIORESAL) 10 MG tablet       Consistent with acute vs subacute 3 weeks R shoulder vs Upper arm pain, no clear inciting injury Suspect may be R shoulder rotator cuff tendinopathy bursitis Also concern w/ prior history R breast cancer and radiation / s/p mastectomy lymph node dissection causing symptoms of residual or chronic pain. Unsure why would develop or show up now.  She was not familiar with this pain before. Chart review showed X-ray R Shoulder for post-mastectomy pain in 08/2019. Negative for arthropathy.   Plan: 1. Start anti inflammatory rx Naproxen 552m twice daily (with food) for 2 weeks, then as needed - Add Baclofen PRN muscle relaxant caution sedation 2. May take Tylenol Ex Str 1-2 q 6 hr PRN 3. Relative rest but keep shoulder mobile, demonstrated ROM exercises, avoid heavy lifting 4. May try heating pad PRN 5. Follow-up 4-6 weeks if not improved for re-evaluation, consider repeat X-ray imaging, vs referral to PT vs Ortho  She returns to Oncology in 1 week as well for follow-up surveillance on breast cancer would consider further evaluation there to determine if more consistent with her previous post surgical and radiation pain.   Meds ordered this encounter  Medications   naproxen (NAPROSYN) 500 MG tablet    Sig: Take 1 tablet (500 mg total) by mouth 2 (two) times daily with a meal. For 1-2 weeks then as needed    Dispense:  60 tablet    Refill:  1   baclofen (LIORESAL) 10 MG tablet    Sig: Take 0.5-1 tablets (5-10 mg total) by mouth 2 (two) times daily as needed for muscle spasms.    Dispense:  30 each    Refill:  1      Follow up plan: Return if symptoms worsen or fail to improve.    ANobie Putnam DO [facility] 05/02/2022, 5:02 PM

## 2022-05-02 NOTE — Telephone Encounter (Signed)
  Chief Complaint: right arm pain Symptoms: pain, weak, can't hold up Frequency: constant Pertinent Negatives: Patient denies fever Disposition: '[]'$ ED /'[]'$ Urgent Care (no appt availability in office) / '[x]'$ Appointment(In office/virtual)/ '[]'$  [place] [place] [facility]/ '[]'$ Home Care/ '[]'$ Refused Recommended Disposition /'[]'$ [facility]/ '[]'$  Follow-up with PCP Additional Notes: Appt made for today, pt had right breast cancer and is concerned about right arm pain.  Reason for Disposition  Numbness (i.e., loss of sensation) in hand or fingers  Answer Assessment - Initial Assessment Questions 1. ONSET: "When did the pain start?"     3 weeks ago 2. LOCATION: "Where is the pain located?"     Right arm, had right breast cancer, was weak but not painful then 3. PAIN: "How bad is the pain?" (Scale 1-10; or mild, moderate, severe)   - MILD (1-3): doesn't interfere with normal activities   - MODERATE (4-7): interferes with normal activities (e.g., work or school) or awakens from sleep   - SEVERE (8-10): excruciating pain, unable to do any normal activities, unable to hold a cup of water     9 4. WORK OR EXERCISE: "Has there been any recent work or exercise that involved this part of the body?"     no 5. CAUSE: "What do you think is causing the arm pain?"     unsure 6. OTHER SYMPTOMS: "Do you have any other symptoms?" (e.g., neck pain, swelling, rash, fever, numbness, weakness)     no 7. PREGNANCY: "Is there any chance you are pregnant?" "When was your last menstrual period?"     na  Protocols used: Arm Pain-A-AH

## 2022-05-08 ENCOUNTER — Other Ambulatory Visit: Payer: Self-pay

## 2022-05-08 DIAGNOSIS — Z853 Personal history of malignant neoplasm of breast: Secondary | ICD-10-CM

## 2022-05-09 ENCOUNTER — Inpatient Hospital Stay: Payer: Medicare Other | Attending: Oncology

## 2022-05-09 ENCOUNTER — Encounter: Payer: Self-pay | Admitting: Oncology

## 2022-05-09 ENCOUNTER — Inpatient Hospital Stay (HOSPITAL_BASED_OUTPATIENT_CLINIC_OR_DEPARTMENT_OTHER): Payer: Medicare Other | Admitting: Oncology

## 2022-05-09 ENCOUNTER — Other Ambulatory Visit: Payer: Self-pay

## 2022-05-09 VITALS — BP 134/88 | HR 87 | Temp 97.2°F | Resp 18 | Wt 244.3 lb

## 2022-05-09 DIAGNOSIS — Z9221 Personal history of antineoplastic chemotherapy: Secondary | ICD-10-CM | POA: Diagnosis not present

## 2022-05-09 DIAGNOSIS — C50411 Malignant neoplasm of upper-outer quadrant of right female breast: Secondary | ICD-10-CM

## 2022-05-09 DIAGNOSIS — R7989 Other specified abnormal findings of blood chemistry: Secondary | ICD-10-CM | POA: Insufficient documentation

## 2022-05-09 DIAGNOSIS — R944 Abnormal results of kidney function studies: Secondary | ICD-10-CM | POA: Insufficient documentation

## 2022-05-09 DIAGNOSIS — Z923 Personal history of irradiation: Secondary | ICD-10-CM | POA: Diagnosis not present

## 2022-05-09 DIAGNOSIS — Z79811 Long term (current) use of aromatase inhibitors: Secondary | ICD-10-CM | POA: Diagnosis not present

## 2022-05-09 DIAGNOSIS — Z170 Estrogen receptor positive status [ER+]: Secondary | ICD-10-CM

## 2022-05-09 DIAGNOSIS — Z87891 Personal history of nicotine dependence: Secondary | ICD-10-CM | POA: Diagnosis not present

## 2022-05-09 DIAGNOSIS — Z79899 Other long term (current) drug therapy: Secondary | ICD-10-CM | POA: Insufficient documentation

## 2022-05-09 DIAGNOSIS — Z853 Personal history of malignant neoplasm of breast: Secondary | ICD-10-CM | POA: Diagnosis not present

## 2022-05-09 LAB — COMPREHENSIVE METABOLIC PANEL
ALT: 19 U/L (ref 0–44)
AST: 20 U/L (ref 15–41)
Albumin: 3.7 g/dL (ref 3.5–5.0)
Alkaline Phosphatase: 94 U/L (ref 38–126)
Anion gap: 8 (ref 5–15)
BUN: 24 mg/dL — ABNORMAL HIGH (ref 6–20)
CO2: 26 mmol/L (ref 22–32)
Calcium: 9.1 mg/dL (ref 8.9–10.3)
Chloride: 103 mmol/L (ref 98–111)
Creatinine, Ser: 1.24 mg/dL — ABNORMAL HIGH (ref 0.44–1.00)
GFR, Estimated: 52 mL/min — ABNORMAL LOW (ref >60)
Glucose, Bld: 140 mg/dL — ABNORMAL HIGH (ref 70–99)
Potassium: 3.8 mmol/L (ref 3.5–5.1)
Sodium: 137 mmol/L (ref 135–145)
Total Bilirubin: 0.4 mg/dL (ref 0.3–1.2)
Total Protein: 7.6 g/dL (ref 6.5–8.1)

## 2022-05-09 LAB — CBC WITH DIFFERENTIAL/PLATELET
Abs Immature Granulocytes: 0.03 10*3/uL (ref 0.00–0.07)
Basophils Absolute: 0 10*3/uL (ref 0.0–0.1)
Basophils Relative: 0 %
Eosinophils Absolute: 0.3 10*3/uL (ref 0.0–0.5)
Eosinophils Relative: 3 %
HCT: 36.5 % (ref 36.0–46.0)
Hemoglobin: 11.4 g/dL — ABNORMAL LOW (ref 12.0–15.0)
Immature Granulocytes: 0 %
Lymphocytes Relative: 20 %
Lymphs Abs: 1.7 10*3/uL (ref 0.7–4.0)
MCH: 26 pg (ref 26.0–34.0)
MCHC: 31.2 g/dL (ref 30.0–36.0)
MCV: 83.3 fL (ref 80.0–100.0)
Monocytes Absolute: 0.3 10*3/uL (ref 0.1–1.0)
Monocytes Relative: 4 %
Neutro Abs: 6.1 10*3/uL (ref 1.7–7.7)
Neutrophils Relative %: 73 %
Platelets: 336 10*3/uL (ref 150–400)
RBC: 4.38 MIL/uL (ref 3.87–5.11)
RDW: 17.4 % — ABNORMAL HIGH (ref 11.5–15.5)
WBC: 8.4 10*3/uL (ref 4.0–10.5)
nRBC: 0 % (ref 0.0–0.2)

## 2022-05-09 MED ORDER — LETROZOLE 2.5 MG PO TABS: 2.5000 mg | ORAL_TABLET | Freq: Every day | ORAL | 1 refills | Status: DC

## 2022-05-09 NOTE — Progress Notes (Signed)
Pt here to establish are with Dr. Tasia Catchings. Pt reports occasional sharp pain to right breast.

## 2022-05-09 NOTE — Assessment & Plan Note (Signed)
Likely due to NSAIDS use. Encourage hydration.

## 2022-05-09 NOTE — Assessment & Plan Note (Signed)
Bone density on 05/04/2021 was normal. Recommend patient to take calcium 1200mg and vitamin D supplementation.  

## 2022-05-09 NOTE — Assessment & Plan Note (Addendum)
Clinically, she is doing well. Exam reveals no evidence of recurrent disease, focal tight scarring tissue. . Continue annual screen mammogram of left breast.  Labs are reviewed and discussed with patient. 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  At risk of lymphedema, refer to lymphedema clinic.

## 2022-05-09 NOTE — Progress Notes (Signed)
Hematology/Oncology Progress note [phone] [phone]     Clinic Day:  05/09/2022   Referring physician: Jearld Fenton, NP  ASSESSMENT & PLAN:   Assessment & Plan: History of right breast cancer Clinically, she is doing well. Exam reveals no evidence of recurrent disease, focal tight scarring tissue. . Continue annual screen mammogram of left breast.  Labs are reviewed and discussed with patient. 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  At risk of lymphedema, refer to lymphedema clinic.    Aromatase inhibitor use Bone density on 05/04/2021 was normal. Recommend patient to take calcium 1233m and vitamin D supplementation.    Elevated serum creatinine Likely due to NSAIDS use. Encourage hydration.    The patient understands the plans discussed today and is in agreement with them.  She knows to contact our office if she develops concerns prior to her next appointment.  Follow up in 6 months.   ZEarlie Server MD  Orders Placed This Encounter  Procedures   CBC with Differential/Platelet    Standing Status:   Future    Standing Expiration Date:   05/10/2023   Comprehensive metabolic panel    Standing Status:   Future    Standing Expiration Date:   05/09/2023   Cancer antigen 27.29    Standing Status:   Future    Standing Expiration Date:   05/10/2023   Ambulatory referral to Occupational Therapy    Referral Priority:   Routine    Referral Type:   Occupational Therapy    Referral Reason:   Specialty Services Required    Requested Specialty:   Occupational Therapy    Number of Visits Requested:   1      CHIEF COMPLAINT:  Chief complaints: follow up for history of breast cancer.   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  Biopsy on 02/28/2017 revealed invasive carcinoma with mucinous features.  Lymph node biopsy revealed metastatic disease.  Tumor was ER positive (90%), PR positive (90%) and HER-2/neu negative.  She had clinical stage T3N3aM0 breast cancer.   PET scan on 03/09/2017 revealed hypermetabolic right axillary/subpectoral adenopathy.  There was low-grade activity in the right upper breast likely at the postoperative site.  Appearance was compatible with metastatic spread to right axillary/subpectoral lymph nodes.  There was some sigmoid colon diverticulosis, with faint inflammatory findings adjacent to the sigmoid colon proximally, and with accentuated activity in the involved segment of the sigmoid colon.    Partial mastectomy and sentinel lymph node biopsy on 07/30/2017 revealed a 1.8 cm invasive mammary carcinoma with mucinous features.  There was lymphovascular invasion.  Eight of 12 lymph nodes were positive for metastatic disease.   She required multiple excisions to obtain clear margins resulting in a full mastectomy on 09/28/2017.  She underwent complete axillary node dissection on 03/04/2018.  Level 2 and 3 lymph node dissection revealed 3 of 3 lymph nodes positive for carcinoma.  The largest tumor deposit was at least 20 mm.  Extracapsular extension was present < 1 mm beyond the lymph node capsule.   She received 4 cycles of AC (03/22/2017 - 05/02/2017) and 7 of 12 cycles of neoadjuvant Taxol (05/16/2017 - 06/27/2017).  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  She received adjuvant radiation at [facility].  She began Letrozole in 05/2018.  Patient reports feeling well. Occasionally she has sharp pain at the previous mastectomy site. Otherwise she has no new complaints.  She tolerates Letrozole, with no side effects.  01/18/22 left screening mammogram negative.    REVIEW OF SYSTEMS:  Review of Systems   Constitutional:  Negative for appetite change, chills, fatigue and fever.  HENT:   Negative for hearing loss and voice change.   Eyes:  Negative for eye problems.  Respiratory:  Negative for chest tightness and cough.   Cardiovascular:  Negative for chest pain.  Gastrointestinal:  Negative for abdominal distention, abdominal pain and blood in stool.  Endocrine: Negative for hot flashes.  Genitourinary:  Negative for difficulty urinating and frequency.   Musculoskeletal:  Negative for arthralgias.  Skin:  Negative for itching and rash.  Neurological:  Negative for extremity weakness.  Hematological:  Negative for adenopathy.  Psychiatric/Behavioral:  Negative for confusion.       VITALS:  Blood pressure 134/88, pulse 87, temperature (!) 97.2 F (36.2 C), resp. rate 18, weight 244 lb 4.8 oz (110.8 kg), last menstrual period 01/31/2014.  Wt Readings from Last 3 Encounters:  05/09/22 244 lb 4.8 oz (110.8 kg)  05/02/22 241 lb 6.4 oz (109.5 kg)  12/20/21 227 lb (103 kg)    Body mass index is 39.43 kg/m.  Performance status (ECOG): 0 - Asymptomatic  PHYSICAL EXAM:  Physical Exam Constitutional:      General: She is not in acute distress.    Appearance: She is not diaphoretic.  HENT:     Head: Normocephalic and atraumatic.     Nose: Nose normal.     Mouth/Throat:     Pharynx: No oropharyngeal exudate.  Eyes:     General: No scleral icterus.    Pupils: Pupils are equal, round, and reactive to light.  Cardiovascular:     Rate and Rhythm: Normal rate and regular rhythm.     Heart sounds: No murmur heard. Pulmonary:     Effort: Pulmonary effort is normal. No respiratory distress.     Breath sounds: No rales.  Chest:     Chest wall: No tenderness.  Abdominal:     General: There is no distension.     Palpations: Abdomen is soft.     Tenderness: There is no abdominal tenderness.  Musculoskeletal:        General: Normal range of motion.     Cervical back: Normal range of  motion and neck supple.  Skin:    General: Skin is warm and dry.     Findings: No erythema.  Neurological:     Mental Status: She is alert and oriented to person, place, and time.     Cranial Nerves: No cranial nerve deficit.     Motor: No abnormal muscle tone.     Coordination: Coordination normal.  Psychiatric:        Mood and Affect: Affect normal.    Breast exam is performed in seated and lying down position. Patient is status post right mastectomy. Focal tight scar tissue.  No palpable chest wall recurrence. No palpable bilateral axillary adenopathy   LABS:      Latest Ref Rng & Units 05/09/2022    1:43 PM 12/20/2021    3:31 PM 11/04/2021    2:50 PM  CBC  WBC 4.0 - 10.5 K/uL 8.4  9.2  8.7   Hemoglobin 12.0 - 15.0 g/dL 11.4  11.8  11.1   Hematocrit 36.0 - 46.0 % 36.5  37.1  35.1   Platelets 150 - 400 K/uL 336  341  340       Latest Ref Rng & Units 05/09/2022    1:43 PM 12/20/2021    3:31 PM 11/04/2021    2:50 PM  CMP  Glucose 70 - 99 mg/dL 140  100  96   BUN 6 - 20 mg/dL 24  26  23    Creatinine 0.44 - 1.00 mg/dL 1.24  1.23  0.83   Sodium 135 - 145 mmol/L 137  140  138   Potassium 3.5 - 5.1 mmol/L 3.8  4.1  3.7   Chloride 98 - 111 mmol/L 103  105  104   CO2 22 - 32 mmol/L 26  25  23    Calcium 8.9 - 10.3 mg/dL 9.1  9.8  9.0   Total Protein 6.5 - 8.1 g/dL 7.6  7.2  7.7   Total Bilirubin 0.3 - 1.2 mg/dL 0.4  0.3  0.3   Alkaline Phos 38 - 126 U/L 94   97   AST 15 - 41 U/L 20  12  18    ALT 0 - 44 U/L 19  14  19      STUDIES:  No results found.    HISTORY:   Past Medical History:  Diagnosis Date   Arthritis    SHOULDER   Breast cancer ([place]) 02/2017   rt breast   Breast cancer ([place]) 2018   Cancer ([place]) 02/28/2017   INVASIVE MAMMARY CARCINOMA WITH MUCINOUS FEATURES.   Colonic diverticular abscess 06/21/2017   Colonoscopy 06/25/2017: No evidence of malignancy.   Diabetes mellitus without complication ([place])    Hypertension    Irregular heart beat    PT STATES  IT "SKIPS A BEAT"    Obesity    Personal history of chemotherapy    Personal history of radiation therapy     Past Surgical History:  Procedure Laterality Date   ANKLE SURGERY     BREAST BIOPSY Right 02/28/2017   INVASIVE MAMMARY CARCINOMA WITH MUCINOUS FEATURES.   BREAST CYST ASPIRATION Right    NEG   COLONOSCOPY WITH PROPOFOL N/A 06/25/2017   Procedure: COLONOSCOPY WITH PROPOFOL;  Surgeon: Jonathon Bellows, MD;  Location: [facility] ENDOSCOPY;  Service: Gastroenterology;  Laterality: N/A;   MASTECTOMY, PARTIAL Right 07/30/2017   Procedure: MASTECTOMY PARTIAL;  Surgeon: Robert Bellow, MD;  Location: [facility] ORS;  Service: General;  Laterality: Right;   PORTACATH PLACEMENT Left 03/15/2017   Procedure: INSERTION PORT-A-CATH;  Surgeon: Robert Bellow, MD;  Location: [facility] ORS;  Service: General;  Laterality: Left;   RE-EXCISION OF BREAST LUMPECTOMY Right 08/17/2017    INVASIVE CARCINOMA EXTENDS TO NEW LATERAL MARGIN. /RE-EXCISION OF BREAST LUMPECTOMY;: Byrnett, Forest Gleason, MD;  [facility] ORS; General;  Laterality: Right;   SENTINEL NODE BIOPSY Right 07/30/2017   Procedure: SENTINEL NODE BIOPSY;  Surgeon: Robert Bellow, MD;  Location: [facility] ORS;  Service: General;  Laterality: Right;   SIMPLE MASTECTOMY WITH AXILLARY SENTINEL NODE BIOPSY Right 09/28/2017   Procedure: SIMPLE MASTECTOMY;  Surgeon: Robert Bellow, MD;  Location: [facility] ORS;  Service: General;  Laterality: Right;    Family History  Problem Relation Age of Onset   Diabetes Father    Stroke Father    Hypertension Father    Hypertension Mother    Brain cancer Maternal Aunt 60   Diabetes Sister    Colon cancer Neg Hx    Breast  cancer Neg Hx     Social History:  reports that she quit smoking about 4 years ago. Her smoking use included cigarettes. She has a 6.25 pack-year smoking history. She has quit using smokeless tobacco. She reports that she does not currently use alcohol. She reports that she does not use drugs.  Allergies:   Allergies  Allergen Reactions   Other Hives and Itching    [name]     Current Medications: Current Outpatient Medications  Medication Sig Dispense Refill   atorvastatin (LIPITOR) 10 MG tablet TAKE 1 TABLET BY MOUTH DAILY 100 tablet 2   baclofen (LIORESAL) 10 MG tablet Take 0.5-1 tablets (5-10 mg total) by mouth 2 (two) times daily as needed for muscle spasms. 30 each 1   gabapentin (NEURONTIN) 400 MG capsule TAKE 1 CAPSULE BY MOUTH AT  BEDTIME 90 capsule 0   letrozole (FEMARA) 2.5 MG tablet Take 1 tablet (2.5 mg total) by mouth daily. 90 tablet 1   lisinopril (ZESTRIL) 5 MG tablet TAKE 1 TABLET BY MOUTH DAILY 100 tablet 1   metFORMIN (GLUCOPHAGE) 500 MG tablet TAKE 2 TABLETS BY MOUTH ONCE  DAILY WITH BREAKFAST AND 1  TABLET BY MOUTH WITH SUPPER 300 tablet 1   naproxen (NAPROSYN) 500 MG tablet Take 1 tablet (500 mg total) by mouth 2 (two) times daily with a meal. For 1-2 weeks then as needed 60 tablet 1   potassium chloride (KLOR-CON) 10 MEQ tablet TAKE 1 TABLET BY MOUTH TWICE  DAILY 180 tablet 0   triamterene-hydrochlorothiazide (MAXZIDE-25) 37.5-25 MG tablet TAKE 1 TABLET BY MOUTH DAILY 100 tablet 1   furosemide (LASIX) 20 MG tablet Take 1 tablet (20 mg total) by mouth daily. (Patient not taking: Reported on 03/06/2022) 3 tablet 0   No current facility-administered medications for this visit.

## 2022-05-10 ENCOUNTER — Telehealth: Payer: Self-pay

## 2022-05-10 ENCOUNTER — Other Ambulatory Visit: Payer: Self-pay

## 2022-05-10 DIAGNOSIS — M25511 Pain in right shoulder: Secondary | ICD-10-CM

## 2022-05-10 LAB — CANCER ANTIGEN 27.29: CA 27.29: 27.9 U/mL (ref 0.0–38.6)

## 2022-05-10 NOTE — Telephone Encounter (Signed)
Spoke to pt and she clarified that pain was coming from right mastectomy site, but she did mention she wasn't sure if it was also her right shoulder or right arm. She did go to see pcp last week and he gave her medication that is helping and pain has improved.

## 2022-05-10 NOTE — Telephone Encounter (Signed)
-----   Message from Earlie Server, MD sent at 05/09/2022 10:27 PM EDT ----- She reported right mastectomy site pain with today's visit. Reviewed his pcp's note on 6/13, she has reported right shoulder pain. Would you please clarify with her, the location, severity, duration, and if better with recent ibuprofen. Thanks.

## 2022-05-10 NOTE — Telephone Encounter (Signed)
Pt informed that Right shoulder xray has been ordered and that it is a walk in procedure.  Pt verbalized understanding.

## 2022-05-17 ENCOUNTER — Inpatient Hospital Stay: Payer: Medicare Other | Admitting: Occupational Therapy

## 2022-05-17 ENCOUNTER — Telehealth: Payer: Self-pay

## 2022-05-17 ENCOUNTER — Other Ambulatory Visit: Payer: Self-pay

## 2022-05-17 DIAGNOSIS — M25511 Pain in right shoulder: Secondary | ICD-10-CM

## 2022-05-17 NOTE — Telephone Encounter (Signed)
Order faxed to [facility] for OTC compression sleeve and glove compression for right extremity lymphedema. Order for PT for Right shoulder pain entered per [facility].

## 2022-05-17 NOTE — Therapy (Unsigned)
[place] [facility] at [facility] [place], [place], [place], [address] Phone: [phone]   Fax:  [phone]  Occupational Therapy Screen  Patient Details  Name: Chelsea Hudson MRN: [id number] Date of Birth: December 16, 1965 No data recorded  Encounter Date: 05/17/2022   OT End of Session - 05/17/22 1320     Visit Number 0             Past Medical History:  Diagnosis Date   Arthritis    SHOULDER   Breast cancer ([place]) 02/2017   rt breast   Breast cancer ([place]) 2018   Cancer ([place]) 02/28/2017   INVASIVE MAMMARY CARCINOMA WITH MUCINOUS FEATURES.   Colonic diverticular abscess 06/21/2017   Colonoscopy 06/25/2017: No evidence of malignancy.   Diabetes mellitus without complication ([place])    Hypertension    Irregular heart beat    PT STATES IT "SKIPS A BEAT"    Obesity    Personal history of chemotherapy    Personal history of radiation therapy     Past Surgical History:  Procedure Laterality Date   ANKLE SURGERY     BREAST BIOPSY Right 02/28/2017   INVASIVE MAMMARY CARCINOMA WITH MUCINOUS FEATURES.   BREAST CYST ASPIRATION Right    NEG   COLONOSCOPY WITH PROPOFOL N/A 06/25/2017   Procedure: COLONOSCOPY WITH PROPOFOL;  Surgeon: Jonathon Bellows, MD;  Location: [facility] ENDOSCOPY;  Service: Gastroenterology;  Laterality: N/A;   MASTECTOMY, PARTIAL Right 07/30/2017   Procedure: MASTECTOMY PARTIAL;  Surgeon: Robert Bellow, MD;  Location: [facility] ORS;  Service: General;  Laterality: Right;   PORTACATH PLACEMENT Left 03/15/2017   Procedure: INSERTION PORT-A-CATH;  Surgeon: Robert Bellow, MD;  Location: [facility] ORS;  Service: General;  Laterality: Left;   RE-EXCISION OF BREAST LUMPECTOMY Right 08/17/2017    INVASIVE CARCINOMA EXTENDS TO NEW LATERAL MARGIN. /RE-EXCISION OF BREAST LUMPECTOMY;: Robert Bellow, MD;  [facility]; General;  Laterality: Right;   SENTINEL NODE BIOPSY Right 07/30/2017   Procedure: SENTINEL NODE BIOPSY;  Surgeon: Robert Bellow, MD;  Location: [facility] ORS;  Service: General;  Laterality: Right;   SIMPLE MASTECTOMY WITH AXILLARY SENTINEL NODE BIOPSY Right 09/28/2017   Procedure: SIMPLE MASTECTOMY;  Surgeon: Robert Bellow, MD;  Location: [facility] ORS;  Service: General;  Laterality: Right;    There were no vitals filed for this visit.   Subjective Assessment - 05/17/22 1316     Subjective  I had my follow-up with Dr. Tasia Catchings last week-one of the reasons I would wanted to see her was I have increased right upper arm pain as well as under my arm and chest.  She noticed that my arm was swelling more and wanted me to check in with you again.  I think I seen you about a year or 2 ago during [place].  We will wearing masks.  My arm swells at times more than other times.  I started since I seen Dr. Tasia Catchings last week using my pump for 2 hours a day and that helps with the swelling.  I do not wear any compression anymore for a long time.    Currently in Pain? Yes    Pain Score 7     Pain Location --   Shouder and axilla   Pain Orientation Right    Pain Descriptors / Indicators Aching;Tightness;Sore    Pain Type Acute pain;Chronic pain    Pain Onset More than a month ago    Pain Frequency Intermittent  Aggravating Factors  When reaching with the right shoulder                 LYMPHEDEMA/ONCOLOGY QUESTIONNAIRE - 05/17/22 0001       Right Upper Extremity Lymphedema   15 cm Proximal to Olecranon Process 43 cm    10 cm Proximal to Olecranon Process 38.4 cm    Olecranon Process 31.5 cm    15 cm Proximal to Ulnar Styloid Process 28.2 cm    10 cm Proximal to Ulnar Styloid Process 23 cm    Just Proximal to Ulnar Styloid Process 19 cm    Across Hand at [employer] 20.4 cm      Left Upper Extremity Lymphedema   15 cm Proximal to Olecranon Process 42 cm    10 cm Proximal to Olecranon Process 37.5 cm    Olecranon Process 30 cm    15 cm Proximal to Ulnar Styloid Process 26.8 cm    10 cm Proximal to Ulnar Styloid  Process 22.4 cm    Just Proximal to Ulnar Styloid Process 19 cm    Across Hand at [employer] 19.2 cm                Dr. Collie Siad note 05/13/2022: Assessment & Plan: History of right breast cancer Clinically, she is doing well. Exam reveals no evidence of recurrent disease, focal tight scarring tissue. . Continue annual screen mammogram of left breast.  Labs are reviewed and discussed with patient. 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  At risk of lymphedema, refer to lymphedema clinic.      Aromatase inhibitor use Bone density on 05/04/2021 was normal. Recommend patient to take calcium '1200mg'$  and vitamin D supplementation.      Elevated serum creatinine Likely due to NSAIDS use. Encourage hydration.    The patient understands the plans discussed today and is in agreement with them.  She knows to contact our office if she develops concerns prior to her next appointment.   Follow up in 6 months.     OT SCREEN 05/17/22:    Patient arrived this date referred by Dr. Tasia Catchings with right upper extremity and thoracic lymphedema.  As well as increased right shoulder and axilla pain.  Patient was seen by this OT in 2021 for right upper  UE and thoracic lymphedema.  Patient was fitted with a custom [employer] El[employer] soft compression sleeve and glove for daytime as well as a nighttime garment.  Because of thoracic lymphedema patient was also fitted with a pump chest pain and recommended a unilateral postmastectomy  jovi pack breast pad. Patient reports she used it for a little while but then stopped.  Patient with increased right upper extremity and chest rise for the last 3 to 4 weeks.  Had increased swelling/lymphedema in right upper extremity when seen by Dr. Tasia Catchings. Circumference was measured for right and left upper extremity and compared.  Right upper extremity is increased by 1.5 cm at elbow and forearm.. Reviewed with patient again stages for lymphedema.  As well  as preventative measures.  Recommended patient start back with her pump using it morning and evening about 45 minutes to hours. Wearing her unilateral postmastectomy Jovi pack breast that sports bra some during the day when she feels increased congestion and thoracic.  Decreased lymphedema in the upper arm.  Decreasing pain and discomfort during passive range of motion for shoulder At this stage do not feel patient needs a custom  compression day sleeve.  We will send patient next week to get fitted for over-the-counter Harmony Mitty sleeve and glove to wear with high risk activities like pulling, pushing and lifting because of husband's health patient do a lot of that at home.  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 Reviewed with patient again to start back with some of her PT home program from 2 years ago-reviewed pulleys for shoulder flexion and abduction.  20 reps pain-free patient feels a pull mostly abduction, chest during flexion. Patient to do 2 times a day.  As well as scapular retraction 3 times a day. Would recommend at this time for patient to turn to her physical therapy from 2 years ago for a few sessions to decrease pain increase range of motion and strength in right shoulder. We will ask Dr. Tasia Catchings for in the meantime patient do want to check back in 3 weeks with OT and cancer center in case she did not start physical therapy yet and to                             Visit Diagnosis: Right shoulder pain, unspecified chronicity  Postmastectomy lymphedema syndrome    Problem List Patient Active Problem List   Diagnosis Date Noted   Aromatase inhibitor use 05/09/2022   Elevated serum creatinine 05/09/2022   Osteoarthritis 06/09/2021   Anemia 06/09/2021   Class 2 obesity due to excess calories with body mass index (BMI) of 36.0 to 36.9 in adult 06/09/2021   Hyperlipidemia associated with type 2 diabetes  mellitus ([place]) 10/02/2018   Neuropathy due to chemotherapeutic drug ([place]) 10/02/2018   Controlled type 2 diabetes mellitus with hyperglycemia ([place]) 07/05/2018   Essential hypertension 01/05/2016    Rosalyn Gess, OTR/L,CLT 05/17/2022, 1:20 PM  [place] at [facility] [address], [place], [place], [address] Phone: [phone]   Fax:  [phone]  Name: Chelsea Hudson MRN: [id number] Date of Birth: 06-13-1966

## 2022-05-25 ENCOUNTER — Ambulatory Visit: Payer: Medicare Other | Admitting: Occupational Therapy

## 2022-05-30 ENCOUNTER — Other Ambulatory Visit: Payer: Self-pay | Admitting: Internal Medicine

## 2022-05-30 DIAGNOSIS — G620 Drug-induced polyneuropathy: Secondary | ICD-10-CM

## 2022-05-30 DIAGNOSIS — E876 Hypokalemia: Secondary | ICD-10-CM

## 2022-05-31 ENCOUNTER — Ambulatory Visit: Payer: Medicaid Other | Admitting: Physical Therapy

## 2022-05-31 NOTE — Telephone Encounter (Signed)
Requested Prescriptions  Pending Prescriptions Disp Refills  . gabapentin (NEURONTIN) 400 MG capsule [Pharmacy Med Name: Gabapentin 400 MG Oral Capsule] 90 capsule 3    Sig: TAKE 1 CAPSULE BY MOUTH AT  BEDTIME     Neurology: Anticonvulsants - gabapentin Failed - 05/30/2022 10:56 AM      Failed - Cr in normal range and within 360 days    Creat  Date Value Ref Range Status  12/20/2021 1.23 (H) 0.50 - 1.03 mg/dL Final   Creatinine, Ser  Date Value Ref Range Status  05/09/2022 1.24 (H) 0.44 - 1.00 mg/dL Final   Creatinine, Urine  Date Value Ref Range Status  12/20/2021 74 20 - 275 mg/dL Final         Passed - Completed PHQ-2 or PHQ-9 in the last 360 days      Passed - Valid encounter within last 12 months    Recent Outpatient Visits          4 weeks ago Acute pain of right shoulder   [place], DO   5 months ago Encounter for general adult medical examination with abnormal findings   [facility] [place], Coralie Keens, NP   8 months ago Controlled type 2 diabetes mellitus with hyperglycemia, without long-term current use of insulin [facility])   [facility] [place], Coralie Keens, NP   11 months ago Controlled type 2 diabetes mellitus with hyperglycemia, without long-term current use of insulin [facility])   [facility] [place], Coralie Keens, NP   1 year ago Right ear impacted cerumen   [place], DO      Future Appointments            In 2 weeks Garnette Gunner, Coralie Keens, NP [facility] [place]           . potassium chloride (KLOR-CON) 10 MEQ tablet [Pharmacy Med Name: Potassium Chloride ER 10 MEQ Oral Tablet Extended Release] 180 tablet 3    Sig: TAKE 1 TABLET BY MOUTH TWICE  DAILY     Endocrinology:  Minerals - Potassium Supplementation Failed - 05/30/2022 10:56 AM      Failed - Cr in normal range and within 360 days    Creat  Date Value Ref Range Status   12/20/2021 1.23 (H) 0.50 - 1.03 mg/dL Final   Creatinine, Ser  Date Value Ref Range Status  05/09/2022 1.24 (H) 0.44 - 1.00 mg/dL Final   Creatinine, Urine  Date Value Ref Range Status  12/20/2021 74 20 - 275 mg/dL Final         Passed - K in normal range and within 360 days    Potassium  Date Value Ref Range Status  05/09/2022 3.8 3.5 - 5.1 mmol/L Final         Passed - Valid encounter within last 12 months    Recent Outpatient Visits          4 weeks ago Acute pain of right shoulder   [place], DO   5 months ago Encounter for general adult medical examination with abnormal findings   [facility] [place], Coralie Keens, NP   8 months ago Controlled type 2 diabetes mellitus with hyperglycemia, without long-term current use of insulin [facility])   [facility] [place], Coralie Keens, NP   11 months ago Controlled type 2 diabetes mellitus with hyperglycemia, without long-term  current use of insulin [facility])   [facility] [place], Coralie Keens, NP   1 year ago Right ear impacted cerumen   [place], DO      Future Appointments            In 2 weeks Garnette Gunner, Coralie Keens, NP [facility], [facility] [place]

## 2022-06-02 DIAGNOSIS — Z4431 Encounter for fitting and adjustment of external right breast prosthesis: Secondary | ICD-10-CM | POA: Diagnosis not present

## 2022-06-02 DIAGNOSIS — Z9011 Acquired absence of right breast and nipple: Secondary | ICD-10-CM | POA: Diagnosis not present

## 2022-06-02 DIAGNOSIS — C50411 Malignant neoplasm of upper-outer quadrant of right female breast: Secondary | ICD-10-CM | POA: Diagnosis not present

## 2022-06-05 ENCOUNTER — Ambulatory Visit: Payer: Medicaid Other | Admitting: Physical Therapy

## 2022-06-06 ENCOUNTER — Ambulatory Visit: Payer: Medicare Other | Admitting: Occupational Therapy

## 2022-06-06 ENCOUNTER — Ambulatory Visit: Payer: Medicare Other | Attending: Oncology | Admitting: Physical Therapy

## 2022-06-06 ENCOUNTER — Encounter: Payer: Self-pay | Admitting: Physical Therapy

## 2022-06-06 DIAGNOSIS — I972 Postmastectomy lymphedema syndrome: Secondary | ICD-10-CM

## 2022-06-06 DIAGNOSIS — M25511 Pain in right shoulder: Secondary | ICD-10-CM | POA: Diagnosis not present

## 2022-06-06 DIAGNOSIS — G8929 Other chronic pain: Secondary | ICD-10-CM | POA: Insufficient documentation

## 2022-06-06 NOTE — Therapy (Signed)
[facility] [address], [place], [address] Phone: [phone]   Fax:  [phone]  Occupational Therapy Screen:  Patient Details  Name: Chelsea Hudson MRN: [id number] Date of Birth: [birth year] No data recorded  Encounter Date: 06/06/2022   OT End of Session - 06/06/22 0946     Visit Number 0             Past Medical History:  Diagnosis Date   Arthritis    SHOULDER   Breast cancer ([place]) 02/2017   rt breast   Breast cancer ([place]) 2018   Cancer ([place]) 02/28/2017   INVASIVE MAMMARY CARCINOMA WITH MUCINOUS FEATURES.   Colonic diverticular abscess 06/21/2017   Colonoscopy 06/25/2017: No evidence of malignancy.   Diabetes mellitus without complication ([place])    Hypertension    Irregular heart beat    PT STATES IT "SKIPS A BEAT"    Obesity    Personal history of chemotherapy    Personal history of radiation therapy     Past Surgical History:  Procedure Laterality Date   ANKLE SURGERY     BREAST BIOPSY Right 02/28/2017   INVASIVE MAMMARY CARCINOMA WITH MUCINOUS FEATURES.   BREAST CYST ASPIRATION Right    NEG   COLONOSCOPY WITH PROPOFOL N/A 06/25/2017   Procedure: COLONOSCOPY WITH PROPOFOL;  Surgeon: Jonathon Bellows, MD;  Location: [facility] ENDOSCOPY;  Service: Gastroenterology;  Laterality: N/A;   MASTECTOMY, PARTIAL Right 07/30/2017   Procedure: MASTECTOMY PARTIAL;  Surgeon: Robert Bellow, MD;  Location: [facility] ORS;  Service: General;  Laterality: Right;   PORTACATH PLACEMENT Left 03/15/2017   Procedure: INSERTION PORT-A-CATH;  Surgeon: Robert Bellow, MD;  Location: [facility] ORS;  Service: General;  Laterality: Left;   RE-EXCISION OF BREAST LUMPECTOMY Right 08/17/2017    INVASIVE CARCINOMA EXTENDS TO NEW LATERAL MARGIN. /RE-EXCISION OF BREAST LUMPECTOMY;: Robert Bellow, MD;  [facility]; General;  Laterality: Right;   SENTINEL NODE BIOPSY Right 07/30/2017   Procedure: SENTINEL NODE BIOPSY;  Surgeon:  Robert Bellow, MD;  Location: [facility] ORS;  Service: General;  Laterality: Right;   SIMPLE MASTECTOMY WITH AXILLARY SENTINEL NODE BIOPSY Right 09/28/2017   Procedure: SIMPLE MASTECTOMY;  Surgeon: Robert Bellow, MD;  Location: [facility] ORS;  Service: General;  Laterality: Right;    There were no vitals filed for this visit.   Subjective Assessment - 06/06/22 0945     Subjective  I only got my glove in the mail. Did not ger sleeve yet - I am wearing my old sleeve and pumping 2 x day    Currently in Pain? No/denies                 LYMPHEDEMA/ONCOLOGY QUESTIONNAIRE - 06/06/22 0001       Right Upper Extremity Lymphedema   15 cm Proximal to Olecranon Process 42.5 cm    10 cm Proximal to Olecranon Process 39 cm    Olecranon Process 31 cm    15 cm Proximal to Ulnar Styloid Process 27.5 cm    10 cm Proximal to Ulnar Styloid Process 23 cm    Just Proximal to Ulnar Styloid Process 19 cm              Patient was seen 05/17/22 after referred by Dr. Tasia Catchings with right upper extremity and thoracic lymphedema.  As well as increased right shoulder and axilla pain.  Patient was seen by this OT in 2021 for right upper  UE and  thoracic lymphedema.  Patient was fitted with a custom [employer] soft compression sleeve and glove for daytime as well as a nighttime garment.  Because of thoracic lymphedema patient was also fitted with a pump chest pain and recommended a unilateral postmastectomy  jovi pack breast pad. Patient reports she used it for a little while but then stopped.  Patient had increased right upper extremity and chest swelling  4 wks prior.  Had increased swelling/lymphedema in right upper extremity when seen by Dr. Tasia Catchings. Since seen 3 weeks ago patient report she started back using her pump morning and evening about 45 minutes to hour.  As well as wearing her old compression sleeve. Patient to have  unilateral postmastectomy Jovi pack breast that that she can wear during the day under  her sports bra when she feels increased congestion and thoracic.   Patient's right upper extremity measurements did decrease with doing home program the last 3 weeks.  Patient was measured for a over-the-counter [employer] sleeve and glove to wear with high risk activities like pulling, pushing and lifting because of husband's health patient do a lot of that at home.  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 Patient reports she did get the glove in the mild but not the sleeve yet.  This OT reached out to DME company.   Patient had increased shoulder pain also 3 weeks ago and referred her to PT for a few sessions. Patient was evaluated by PT this morning. Patient to follow-up with me after she  get her annual daytime sleeve and wearing it for a week or 2.                           Patient will benefit from skilled therapeutic intervention in order to improve the following deficits and impairments:           Visit Diagnosis: Postmastectomy lymphedema syndrome    Problem List Patient Active Problem List   Diagnosis Date Noted   Aromatase inhibitor use 05/09/2022   Elevated serum creatinine 05/09/2022   Osteoarthritis 06/09/2021   Anemia 06/09/2021   Class 2 obesity due to excess calories with body mass index (BMI) of 36.0 to 36.9 in adult 06/09/2021   Hyperlipidemia associated with type 2 diabetes mellitus ([place]) 10/02/2018   Neuropathy due to chemotherapeutic drug ([place]) 10/02/2018   Controlled type 2 diabetes mellitus with hyperglycemia ([place]) 07/05/2018   Essential hypertension 01/05/2016    Rosalyn Gess, OTR/L,CLT 06/06/2022, 9:47 AM  [facility] [address], [place], [address] Phone: [phone]   Fax:  [phone]  Name: Chelsea Hudson MRN: [id number] Date of Birth: 09-Mar-1966

## 2022-06-06 NOTE — Therapy (Signed)
[facility] [address] [place], [address] Phone: [phone]   Fax:  [phone]  Physical Therapy Treatment  Patient Details  Name: Chelsea Hudson MRN: [id number] Date of Birth: 05-04-1966 No data recorded  Encounter Date: 06/06/2022   PT End of Session - 06/06/22 0831     Visit Number 1    Number of Visits 17    Date for PT Re-Evaluation 08/04/22    Authorization - Visit Number 1    Authorization - Number of Visits 17    Progress Note Due on Visit 10    PT Start Time 0747    PT Stop Time 0830    PT Time Calculation (min) 43 min    Activity Tolerance Patient tolerated treatment well    Behavior During Therapy [facility] for tasks assessed/performed             Past Medical History:  Diagnosis Date   Arthritis    SHOULDER   Breast cancer ([place]) 02/2017   rt breast   Breast cancer ([place]) 2018   Cancer ([place]) 02/28/2017   INVASIVE MAMMARY CARCINOMA WITH MUCINOUS FEATURES.   Colonic diverticular abscess 06/21/2017   Colonoscopy 06/25/2017: No evidence of malignancy.   Diabetes mellitus without complication ([place])    Hypertension    Irregular heart beat    PT STATES IT "SKIPS A BEAT"    Obesity    Personal history of chemotherapy    Personal history of radiation therapy     Past Surgical History:  Procedure Laterality Date   ANKLE SURGERY     BREAST BIOPSY Right 02/28/2017   INVASIVE MAMMARY CARCINOMA WITH MUCINOUS FEATURES.   BREAST CYST ASPIRATION Right    NEG   COLONOSCOPY WITH PROPOFOL N/A 06/25/2017   Procedure: COLONOSCOPY WITH PROPOFOL;  Surgeon: Jonathon Bellows, MD;  Location: [facility] ENDOSCOPY;  Service: Gastroenterology;  Laterality: N/A;   MASTECTOMY, PARTIAL Right 07/30/2017   Procedure: MASTECTOMY PARTIAL;  Surgeon: Robert Bellow, MD;  Location: [facility] ORS;  Service: General;  Laterality: Right;   PORTACATH PLACEMENT Left 03/15/2017   Procedure: INSERTION PORT-A-CATH;  Surgeon: Robert Bellow, MD;  Location: [facility] ORS;  Service: General;  Laterality: Left;   RE-EXCISION OF BREAST LUMPECTOMY Right 08/17/2017    INVASIVE CARCINOMA EXTENDS TO NEW LATERAL MARGIN. /RE-EXCISION OF BREAST LUMPECTOMY;: Robert Bellow, MD;  [place] [facility]; General;  Laterality: Right;   SENTINEL NODE BIOPSY Right 07/30/2017   Procedure: SENTINEL NODE BIOPSY;  Surgeon: Robert Bellow, MD;  Location: [facility] ORS;  Service: General;  Laterality: Right;   SIMPLE MASTECTOMY WITH AXILLARY SENTINEL NODE BIOPSY Right 09/28/2017   Procedure: SIMPLE MASTECTOMY;  Surgeon: Robert Bellow, MD;  Location: [facility] ORS;  Service: General;  Laterality: Right;    There were no vitals filed for this visit.   Subjective Assessment - 06/06/22 0753     Pertinent History Pt is a 56 year old female familar with this clinic. Insideous onset in May of this year started having some increased swelling and pain in her R shoulder, and was not able to lift it as high as the L. 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 Has difficulty lifting herself up into the truck (grabbing overhead handle).  She has difficulty with overhead ADLS (reaching into overhead cabinet, cleaning her TV and windows. Pt completes line dancing 3x/week for exercises. Is still wanting to complete R breast reconstruction, but per patient MD would like her to lose more weight prior. Pt is R handed. Pt denies N/V, B&B changes, unexplained weight fluctuation, saddle paresthesia, fever, night sweats, or unrelenting night pain at this time.    Limitations Lifting;House hold activities;Reading    How long can you sit comfortably? unlimited    How long can you stand comfortably? unlimited    How long can you walk comfortably? unlimited    Diagnostic tests none     Patient Stated Goals be able to lift my arm over my head    Currently in Pain? No/denies               OBJECTIVE  MUSCULOSKELETAL: Tremor: Normal Bulk: Normal Tone: Normal  Observation: FHRS, upper crossed syndrome Scapular dyskinesis   Cervical Screen AROM: WFL and painless with overpressure in all planes Lateral flexion WNL for motion bilat with L UT stretching with R and L LF Spurlings A (ipsilateral lateral flexion/axial compression): R: Negative L: Negative Spurlings B (ipsilateral lateral flexion/contralateral rotation/axial compression): R: Negative L: Negative Repeated movement: No centralization or peripheralization with protraction or retraction  Elbow Screen Elbow AROM: Within Normal Limits  Palpation TTP at pec minor with noted tension and trigger points Latent trigger points to R UT and levator scapulae Pt reports concordant LUE pain to L UT palpation   Strength R/L 4-/5 Shoulder flexion (anterior deltoid/pec major/coracobrachialis, axillary n. (C5-6) and musculocutaneous n. (C5-7)) 5/5 Shoulder abduction (deltoid/supraspinatus, axillary/suprascapular n, C5) 4+/5 Shoulder external rotation (infraspinatus/teres minor) 5/5 Shoulder internal rotation (subcapularis/lats/pec major) 5/5 Shoulder extension (posterior deltoid, lats, teres major, axillary/thoracodorsal n.) 5/5 Shoulder horizontal abduction 5/5 Elbow flexion (biceps brachii, brachialis, brachioradialis, musculoskeletal n, C5-6) 5/5 Elbow extension (triceps, radial n, C7) 3+/4 Y lower trap 4-/4+ T periscapular scap retractors  AROM R/L 103/180 Shoulder flexion 94/180 Shoulder abduction C3/CTJ Shoulder external rotation T12/T12 Shoulder internal rotation 60/60 Shoulder extension *Indicates pain, overpressure performed unless otherwise indicated  PROM R/L 154/180 Shoulder flexion 130/180 Shoulder abduction 71/90 Shoulder external rotation 82/70 Shoulder internal rotation 60/60 Shoulder  extension *Indicates pain, overpressure performed unless otherwise indicated  Accessory Motions/Glides Glenohumeral: Posterior: R: normal L: normal Inferior: R: normal L: normal Anterior: R: normal L: normal  Acromioclavicular:  Posterior: R: normal L: normal Anterior: R: normal L: normal  Muscle Length Testing Pectoralis Major: R: normal L: normal Pectoralis Minor: R:  normal length, but with tension "stretch" at max stretch  L: normal Biceps: R: abnormal L: abnormal  NEUROLOGICAL:  Mental Status Patient is oriented to person, place and time.  Recent memory is intact.  Remote memory is intact.  Attention span and concentration are intact.  Expressive speech is intact.  Patient's fund of knowledge is within normal limits for educational level.  Sensation Grossly intact to light touch bilateral UE as determined by testing dermatomes C2-T2 Proprioception and hot/cold testing deferred on this date   SPECIAL TESTS  Rotator Cuff  Drop Arm Test: Negative Painful Arc (Pain from 60 to 120 degrees scaption): Negative Infraspinatus Muscle Test: Negative If all 3 tests positive, the probability of a full-thickness rotator cuff tear is 91%  Subacromial Impingement Hawkins-Kennedy: Negative Neer (Block scapula, PROM flexion): Negative Painful Arc (Pain from 60 to 120 degrees scaption): Negative Empty Can: Positive External Rotation Resistance: Positive Horizontal Adduction: Negative Scapular Assist: Negative Positive  Hawkins-Kennedy, Painful arc sign, Infraspinatus muscle test then +LR: 10.56 of some type of impingement present, 2/3 tests: +LR 5.06, -LR 0.17, Positive 3/5 Hawkins-Kennedy, neer, painful arc, empty can, and external rotation resistance then SN: .75 (.54-.96) SP: .74 (.61-.88) +LR: 2.93 (1.60-5.36) -LR: .34 (.14-.80)  Labral Tear Biceps Load II (120 elevation, full ER, 90 elbow flexion, full supination, resisted elbow flexion): Negative Crank (160 scaption,  axial load with IR/ER): Negative Active Compression Test: Negative  Bicep Tendon Pathology Speed (shoulder flexion to 90, external rotation, full elbow extension, and forearm supination with resistance: Negative Yergason's (resisted shoulder ER and supination/biceps tendon pathology): Negative  Shoulder Instability Sulcus Sign: Negative Anterior Apprehension: Negative   Ther-Ex PT reviewed the following HEP with patient with patient able to demonstrate a set of the following with min cuing for correction needed. PT educated patient on parameters of therex (how/when to inc/decrease intensity, frequency, rep/set range, stretch hold time, and purpose of therex) with verbalized understanding.  Access Code: [email or web address] - Seated Shoulder Flexion AAROM with Pulley Behind  - 2 x daily - 7 x weekly - 15-20 reps - 2-3sec hold - Seated Shoulder Abduction AAROM with Pulley Behind  - 2 x daily - 7 x weekly - 15-20 reps - 2-3 hold - doorway pec stretch  - 2 x daily - 7 x weekly - 30-60sec hold                             PT Education - 06/06/22 0830     Education Details Patient was educated on diagnosis, anatomy and pathology involved, prognosis, role of PT, and was given an HEP, demonstrating exercise with proper form following verbal and tactile cues, and was given a paper hand out to continue exercise at home. Pt was educated on and agreed to plan of care.    Person(s) Educated Patient    Methods Explanation;Demonstration;Verbal cues    Comprehension Verbalized understanding;Returned demonstration;Verbal cues required              PT Short Term Goals - 06/06/22 1031       PT SHORT TERM GOAL #1   Title Pt will be independent with HEP in order to decrease shoulder pain and increase strength in order to improve pain-free function at home and work.    Baseline 06/06/22 HEP given    Time 5    Period Weeks    Status New               PT Long Term Goals -  06/06/22 1032       PT LONG TERM GOAL #1   Title Patient will increase FOTO score to 59 to demonstrate predicted increase in functional mobility to complete ADLs    Baseline 06/06/22 46    Time 8    Period Weeks    Status New      PT LONG TERM GOAL #2   Title Pt will demonstrate full R shoulder AROM in order to complete overhead and self care ADLs.    Baseline 06/06/22 flex: 103d abd 94d ER Apleys C3    Time 8    Period Weeks    Status New      PT LONG TERM GOAL #3   Title Pt will demonstrate 4+/5 gross shoulder and periscapular strength in order to complete heavy household ADLs    Baseline 06/06/22 R/L Flex 4-/5; ER 4+/5; Y lower trap 3+/4; T scap retractors  4-/4+    Time 8    Period Weeks    Status New                   Plan - 06/06/22 0952     Clinical Impression Statement Pt is a 56 year old female familiar with this clinic from past OT lymphadema and PT R shoulder ROM management following R sided masectomy fall 2021. Patient currently presenting with increase in RUE lymphadema and subsequent decrease in R shoulder mobility since May 2023. Impairments in R shoulder mobility AROM > PROM, decreased R shoulder and periscapular strength, increased muscle tension of pec minor, and pain. Acitvity limitations in overhead reaching, overhead lifting, washing hair, and forward reaching; inhibiting full participation in self care ADLs. Pt will benefit from skilled PT to address aforementioned impairments to return to optimal PLOF    Personal Factors and Comorbidities Comorbidity 3+;Fitness;Past/Current Experience;Time since onset of injury/illness/exacerbation    Comorbidities HTN, cancer, lymphadema, obesity    Examination-Activity Limitations Bathing;Reach Overhead;Lift;Hygiene/Grooming;Dressing;Carry    Examination-Participation Restrictions Cleaning;Community Activity;Laundry;Yard Work    [employer]chant [employer] officer Evolving/Moderate complexity    Clinical Decision Making  Moderate    Rehab Potential Good    PT Frequency 2x / week    PT Duration 8 weeks    PT Treatment/Interventions ADLs/Self Care Home Management;Gait training;Iontophoresis '4mg'$ /ml Dexamethasone;Neuromuscular re-education;Manual techniques;Aquatic Therapy;Moist Heat;Functional mobility training;Taping;Spinal Manipulations;Dry needling;DME Instruction;Electrical Stimulation;Cryotherapy;Fluidtherapy;Ultrasound;Traction;Therapeutic activities;Therapeutic exercise;Passive range of motion;Patient/family education    PT Next Visit Plan ULTT, HEP review    PT Home Exercise Plan pulleys, pec stretch    Consulted and Agree with Plan of Care Patient             Patient will benefit from skilled therapeutic intervention in order to improve the following deficits and impairments:  Decreased range of motion, Increased fascial restricitons, Impaired tone, Decreased endurance, Impaired UE functional use, Pain, Decreased activity tolerance, Decreased mobility, Decreased strength, Postural dysfunction, Impaired sensation, Impaired flexibility, Improper body mechanics  Visit Diagnosis: Chronic right shoulder pain     Problem List Patient Active Problem List   Diagnosis Date Noted   Aromatase inhibitor use 05/09/2022   Elevated serum creatinine 05/09/2022   Osteoarthritis 06/09/2021   Anemia 06/09/2021   Class 2 obesity due to excess calories with body mass index (BMI) of 36.0 to 36.9 in adult 06/09/2021   Hyperlipidemia associated with type 2 diabetes mellitus ([place]) 10/02/2018   Neuropathy due to chemotherapeutic drug ([place]) 10/02/2018   Controlled type 2 diabetes mellitus with hyperglycemia ([place]) 07/05/2018   Essential hypertension 01/05/2016   Durwin Reges DPT Durwin Reges, PT 06/06/2022, 10:47 AM  [facility] [address], [place], [address] Phone: [phone]   Fax:  [phone]  Name: Chelsea Hudson MRN:  [id number] Date of Birth: June 01, 1966

## 2022-06-07 ENCOUNTER — Ambulatory Visit: Payer: Medicaid Other | Admitting: Occupational Therapy

## 2022-06-08 LAB — HM DIABETES EYE EXAM

## 2022-06-12 DIAGNOSIS — H5213 Myopia, bilateral: Secondary | ICD-10-CM | POA: Diagnosis not present

## 2022-06-13 ENCOUNTER — Encounter: Payer: Self-pay | Admitting: Physical Therapy

## 2022-06-13 ENCOUNTER — Ambulatory Visit: Payer: Medicare Other | Admitting: Physical Therapy

## 2022-06-13 DIAGNOSIS — I972 Postmastectomy lymphedema syndrome: Secondary | ICD-10-CM | POA: Diagnosis not present

## 2022-06-13 DIAGNOSIS — M25511 Pain in right shoulder: Secondary | ICD-10-CM | POA: Diagnosis not present

## 2022-06-13 DIAGNOSIS — G8929 Other chronic pain: Secondary | ICD-10-CM

## 2022-06-13 NOTE — Therapy (Addendum)
OUTPATIENT PHYSICAL THERAPY TREATMENT NOTE   Patient Name: Chelsea Hudson MRN: [id number] DOB:1966-07-23, 56 y.o., female Today's Date: 06/14/2022  PCP: Webb Silversmith NP REFERRING PROVIDER: Earlie Server MD  END OF SESSION:   PT End of Session - 06/13/22 0932     Visit Number 2    Number of Visits 17    Date for PT Re-Evaluation 08/04/22    Authorization Time Period 3 visits 08/12/20 - 09/10/20    Authorization - Visit Number 2    Authorization - Number of Visits 17    Progress Note Due on Visit 10    PT Start Time 0928    PT Stop Time 1000    PT Time Calculation (min) 32 min    Activity Tolerance Patient tolerated treatment well    Behavior During Therapy [facility] for tasks assessed/performed             Past Medical History:  Diagnosis Date   Arthritis    SHOULDER   Breast cancer ([place]) 02/2017   rt breast   Breast cancer ([place]) 2018   Cancer ([place]) 02/28/2017   INVASIVE MAMMARY CARCINOMA WITH MUCINOUS FEATURES.   Colonic diverticular abscess 06/21/2017   Colonoscopy 06/25/2017: No evidence of malignancy.   Diabetes mellitus without complication ([place])    Hypertension    Irregular heart beat    PT STATES IT "SKIPS A BEAT"    Obesity    Personal history of chemotherapy    Personal history of radiation therapy    Past Surgical History:  Procedure Laterality Date   ANKLE SURGERY     BREAST BIOPSY Right 02/28/2017   INVASIVE MAMMARY CARCINOMA WITH MUCINOUS FEATURES.   BREAST CYST ASPIRATION Right    NEG   COLONOSCOPY WITH PROPOFOL N/A 06/25/2017   Procedure: COLONOSCOPY WITH PROPOFOL;  Surgeon: Jonathon Bellows, MD;  Location: [facility] ENDOSCOPY;  Service: Gastroenterology;  Laterality: N/A;   MASTECTOMY, PARTIAL Right 07/30/2017   Procedure: MASTECTOMY PARTIAL;  Surgeon: Robert Bellow, MD;  Location: [facility] ORS;  Service: General;  Laterality: Right;   PORTACATH PLACEMENT Left 03/15/2017   Procedure: INSERTION PORT-A-CATH;  Surgeon: Robert Bellow, MD;  Location: [facility] ORS;   Service: General;  Laterality: Left;   RE-EXCISION OF BREAST LUMPECTOMY Right 08/17/2017    INVASIVE CARCINOMA EXTENDS TO NEW LATERAL MARGIN. /RE-EXCISION OF BREAST LUMPECTOMY;: Byrnett, Forest Gleason, MD;  [facility] ORS; General;  Laterality: Right;   SENTINEL NODE BIOPSY Right 07/30/2017   Procedure: SENTINEL NODE BIOPSY;  Surgeon: Robert Bellow, MD;  Location: [facility] ORS;  Service: General;  Laterality: Right;   SIMPLE MASTECTOMY WITH AXILLARY SENTINEL NODE BIOPSY Right 09/28/2017   Procedure: SIMPLE MASTECTOMY;  Surgeon: Robert Bellow, MD;  Location: [facility] ORS;  Service: General;  Laterality: Right;   Patient Active Problem List   Diagnosis Date Noted   Aromatase inhibitor use 05/09/2022   Elevated serum creatinine 05/09/2022   Osteoarthritis 06/09/2021   Anemia 06/09/2021   Class 2 obesity due to excess calories with body mass index (BMI) of 36.0 to 36.9 in adult 06/09/2021   Hyperlipidemia associated with type 2 diabetes mellitus ([place]) 10/02/2018   Neuropathy due to chemotherapeutic drug ([place]) 10/02/2018   Controlled type 2 diabetes mellitus with hyperglycemia ([place]) 07/05/2018   Essential hypertension 01/05/2016    REFERRING DIAG: Chronic R shoulder pain   THERAPY DIAG:  Chronic right shoulder pain  Rationale for Evaluation and Treatment Rehabilitation  PERTINENT HISTORY: Pt is a 56 year old female familar with  this clinic. Insideous onset in May of this year started having some increased swelling and pain in her R shoulder, and was not able to lift it as high as the L. 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 Has difficulty lifting herself up into the truck (grabbing overhead handle). She has difficulty with overhead ADLS (reaching into overhead cabinet, cleaning her TV and  windows. Pt completes line dancing 3x/week for exercises. Is still wanting to complete R breast reconstruction, but per patient MD would like her to lose more weight prior. Pt is R handed. Pt denies N/V, B&B changes, unexplained weight fluctuation, saddle paresthesia, fever, night sweats, or unrelenting night pain at this time.  PRECAUTIONS: none  SUBJECTIVE: Pt reports no pain on arrival, that she just feels tight. Compliance with HEP without question or concern  PAIN:  Are you having pain? No   OBJECTIVE: (objective measures completed at initial evaluation unless otherwise dated)  Ther-Ex Pulleys flex and abd x12 each  Pullover dowel with 7# dowel x12 with min cuing for technique with good carry over Scapular retraction _ protraction with dowel with 7# x12  Doorway pec stretch 30sec   Manual STM with trigger point release to latissimus dorsi G3 mob with movement in all directions  PROM all directions      PT Short Term Goals - 06/06/22 1031       PT SHORT TERM GOAL #1   Title Pt will be independent with HEP in order to decrease shoulder pain and increase strength in order to improve pain-free function at home and work.    Baseline 06/06/22 HEP given    Time 5    Period Weeks    Status New              PT Long Term Goals - 06/06/22 1032       PT LONG TERM GOAL #1   Title Patient will increase FOTO score to 59 to demonstrate predicted increase in functional mobility to complete ADLs    Baseline 06/06/22 46    Time 8    Period Weeks    Status New      PT LONG TERM GOAL #2   Title Pt will demonstrate full R shoulder AROM in order to complete overhead and self care ADLs.    Baseline 06/06/22 flex: 103d abd 94d ER Apleys C3    Time 8    Period Weeks    Status New      PT LONG TERM GOAL #3   Title Pt will demonstrate 4+/5 gross shoulder and periscapular strength in order to complete heavy household ADLs    Baseline 06/06/22 R/L Flex 4-/5; ER 4+/5; Y lower trap  3+/4; T scap retractors 4-/4+    Time 8    Period Weeks    Status New              Plan - 06/14/22 1048     Personal Factors and Comorbidities Comorbidity 3+;Fitness;Past/Current Experience;Time since onset of injury/illness/exacerbation    Comorbidities HTN, cancer, lymphadema, obesity    Examination-Activity Limitations Bathing;Reach Overhead;Lift;Hygiene/Grooming;Dressing;Carry    Examination-Participation Restrictions Cleaning;Community Activity;Laundry;Yard Work    Stability/Clinical Decision Making Evolving/Moderate complexity    Rehab Potential Good    PT Frequency 2x / week  PT Duration 8 weeks    PT Treatment/Interventions ADLs/Self Care Home Management;Gait training;Iontophoresis '4mg'$ /ml Dexamethasone;Neuromuscular re-education;Manual techniques;Aquatic Therapy;Moist Heat;Functional mobility training;Taping;Spinal Manipulations;Dry needling;DME Instruction;Electrical Stimulation;Cryotherapy;Fluidtherapy;Ultrasound;Traction;Therapeutic activities;Therapeutic exercise;Passive range of motion;Patient/family education    PT Next Visit Plan ULTT, HEP review    PT Home Exercise Plan pulleys, pec stretch    Consulted and Agree with Plan of Care Patient               Durwin Reges, PT 06/14/2022, 11:25 AM        Durwin Reges DPT Durwin Reges, PT 06/14/2022, 11:25 AM

## 2022-06-15 ENCOUNTER — Encounter: Payer: Self-pay | Admitting: Physical Therapy

## 2022-06-15 ENCOUNTER — Ambulatory Visit: Payer: Medicare Other | Admitting: Physical Therapy

## 2022-06-15 DIAGNOSIS — G8929 Other chronic pain: Secondary | ICD-10-CM

## 2022-06-15 DIAGNOSIS — M25511 Pain in right shoulder: Secondary | ICD-10-CM | POA: Diagnosis not present

## 2022-06-15 DIAGNOSIS — I972 Postmastectomy lymphedema syndrome: Secondary | ICD-10-CM | POA: Diagnosis not present

## 2022-06-15 NOTE — Therapy (Signed)
OUTPATIENT PHYSICAL THERAPY TREATMENT NOTE   Patient Name: Chelsea Hudson MRN: [id number] DOB:July 28, 1966, 56 y.o., female Today's Date: 06/15/2022  PCP: Webb Silversmith NP REFERRING PROVIDER: Earlie Server MD  END OF SESSION:   PT End of Session - 06/15/22 0901     Visit Number 3    Number of Visits 17    Date for PT Re-Evaluation 08/04/22    Authorization Time Period 3 visits 08/12/20 - 09/10/20    Authorization - Visit Number 3    Authorization - Number of Visits 17    Progress Note Due on Visit 10    PT Start Time 0833    PT Stop Time 0911    PT Time Calculation (min) 38 min    Activity Tolerance Patient tolerated treatment well    Behavior During Therapy [facility] for tasks assessed/performed              Past Medical History:  Diagnosis Date   Arthritis    SHOULDER   Breast cancer ([place]) 02/2017   rt breast   Breast cancer ([place]) 2018   Cancer ([place]) 02/28/2017   INVASIVE MAMMARY CARCINOMA WITH MUCINOUS FEATURES.   Colonic diverticular abscess 06/21/2017   Colonoscopy 06/25/2017: No evidence of malignancy.   Diabetes mellitus without complication ([place])    Hypertension    Irregular heart beat    PT STATES IT "SKIPS A BEAT"    Obesity    Personal history of chemotherapy    Personal history of radiation therapy    Past Surgical History:  Procedure Laterality Date   ANKLE SURGERY     BREAST BIOPSY Right 02/28/2017   INVASIVE MAMMARY CARCINOMA WITH MUCINOUS FEATURES.   BREAST CYST ASPIRATION Right    NEG   COLONOSCOPY WITH PROPOFOL N/A 06/25/2017   Procedure: COLONOSCOPY WITH PROPOFOL;  Surgeon: Jonathon Bellows, MD;  Location: [facility] ENDOSCOPY;  Service: Gastroenterology;  Laterality: N/A;   MASTECTOMY, PARTIAL Right 07/30/2017   Procedure: MASTECTOMY PARTIAL;  Surgeon: Robert Bellow, MD;  Location: [facility] ORS;  Service: General;  Laterality: Right;   PORTACATH PLACEMENT Left 03/15/2017   Procedure: INSERTION PORT-A-CATH;  Surgeon: Robert Bellow, MD;  Location: [facility] ORS;   Service: General;  Laterality: Left;   RE-EXCISION OF BREAST LUMPECTOMY Right 08/17/2017    INVASIVE CARCINOMA EXTENDS TO NEW LATERAL MARGIN. /RE-EXCISION OF BREAST LUMPECTOMY;: Byrnett, Forest Gleason, MD;  [facility] ORS; General;  Laterality: Right;   SENTINEL NODE BIOPSY Right 07/30/2017   Procedure: SENTINEL NODE BIOPSY;  Surgeon: Robert Bellow, MD;  Location: [facility] ORS;  Service: General;  Laterality: Right;   SIMPLE MASTECTOMY WITH AXILLARY SENTINEL NODE BIOPSY Right 09/28/2017   Procedure: SIMPLE MASTECTOMY;  Surgeon: Robert Bellow, MD;  Location: [facility] ORS;  Service: General;  Laterality: Right;   Patient Active Problem List   Diagnosis Date Noted   Aromatase inhibitor use 05/09/2022   Elevated serum creatinine 05/09/2022   Osteoarthritis 06/09/2021   Anemia 06/09/2021   Class 2 obesity due to excess calories with body mass index (BMI) of 36.0 to 36.9 in adult 06/09/2021   Hyperlipidemia associated with type 2 diabetes mellitus ([place]) 10/02/2018   Neuropathy due to chemotherapeutic drug ([place]) 10/02/2018   Controlled type 2 diabetes mellitus with hyperglycemia ([place]) 07/05/2018   Essential hypertension 01/05/2016    REFERRING DIAG: Chronic R shoulder pain   THERAPY DIAG:  Chronic right shoulder pain  Rationale for Evaluation and Treatment Rehabilitation  PERTINENT HISTORY: Pt is a 56 year old female familar  with this clinic. Insideous onset in May of this year started having some increased swelling and pain in her R shoulder, and was not able to lift it as high as the L. 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 Has difficulty lifting herself up into the truck (grabbing overhead handle). She has difficulty with overhead ADLS (reaching into overhead cabinet, cleaning her TV and  windows. Pt completes line dancing 3x/week for exercises. Is still wanting to complete R breast reconstruction, but per patient MD would like her to lose more weight prior. Pt is R handed. Pt denies N/V, B&B changes, unexplained weight fluctuation, saddle paresthesia, fever, night sweats, or unrelenting night pain at this time.  PRECAUTIONS: none  SUBJECTIVE: Pt reports no pain on arrival, she was sore following last treatment. Doing well with her HEP  PAIN:  Are you having pain? No   OBJECTIVE: (objective measures completed at initial evaluation unless otherwise dated)  Ther-Ex Pulleys flex and abd x12 each  Pullover dowel with 7# dowel x12 with min cuing for technique with good carry over Chest press 7# 2 x12  Y on wall x12 with cuing for decreased thoracic ext compensation and RUE mobility = to L with decent carry over Post capsule stretch x30sec Doorway pec stretch 30sec   Manual STM with trigger point release to latissimus dorsi G3 mob with movement in all directions  PROM all directions  Clinical Impression: PT continued therex progression for increased shoulder mobility and strengthening with success. PT advised patient in scar massage as well with understanding. Pt is motivated throughout session and able to comply with all cuing for proper technique of therex with no increased pain throughout session. PT will continue progression as able.      PT Short Term Goals - 06/06/22 1031       PT SHORT TERM GOAL #1   Title Pt will be independent with HEP in order to decrease shoulder pain and increase strength in order to improve pain-free function at home and work.    Baseline 06/06/22 HEP given    Time 5    Period Weeks    Status New              PT Long Term Goals - 06/06/22 1032       PT LONG TERM GOAL #1   Title Patient will increase FOTO score to 59 to demonstrate predicted increase in functional mobility to complete ADLs    Baseline 06/06/22 46    Time 8     Period Weeks    Status New      PT LONG TERM GOAL #2   Title Pt will demonstrate full R shoulder AROM in order to complete overhead and self care ADLs.    Baseline 06/06/22 flex: 103d abd 94d ER Apleys C3    Time 8    Period Weeks    Status New      PT LONG TERM GOAL #3   Title Pt will demonstrate 4+/5 gross shoulder and periscapular strength in order to complete heavy household ADLs    Baseline 06/06/22 R/L Flex 4-/5; ER 4+/5; Y lower trap 3+/4; T scap retractors 4-/4+    Time 8    Period Weeks    Status New  Plan - 06/15/22 0911     Personal Factors and Comorbidities Comorbidity 3+;Fitness;Past/Current Experience;Time since onset of injury/illness/exacerbation    Comorbidities HTN, cancer, lymphadema, obesity    Examination-Activity Limitations Bathing;Reach Overhead;Lift;Hygiene/Grooming;Dressing;Carry    Examination-Participation Restrictions Cleaning;Community Activity;Laundry;Yard Work    [employer] Evolving/Moderate complexity    Rehab Potential Good    PT Frequency 2x / week    PT Duration 8 weeks    PT Treatment/Interventions ADLs/Self Care Home Management;Gait training;Iontophoresis '4mg'$ /ml Dexamethasone;Neuromuscular re-education;Manual techniques;Aquatic Therapy;Moist Heat;Functional mobility training;Taping;Spinal Manipulations;Dry needling;DME Instruction;Electrical Stimulation;Cryotherapy;Fluidtherapy;Ultrasound;Traction;Therapeutic activities;Therapeutic exercise;Passive range of motion;Patient/family education    PT Next Visit Plan ULTT, HEP review    PT Home Exercise Plan pulleys, pec stretch    Consulted and Agree with Plan of Care Patient                Durwin Reges, PT 06/15/2022, 1:01 PM        Durwin Reges DPT Durwin Reges, PT 06/15/2022, 1:01 PM

## 2022-06-20 ENCOUNTER — Encounter: Payer: Self-pay | Admitting: Physical Therapy

## 2022-06-20 ENCOUNTER — Ambulatory Visit: Payer: Medicare Other | Attending: Oncology | Admitting: Physical Therapy

## 2022-06-20 ENCOUNTER — Ambulatory Visit (INDEPENDENT_AMBULATORY_CARE_PROVIDER_SITE_OTHER): Payer: Medicare Other | Admitting: Internal Medicine

## 2022-06-20 ENCOUNTER — Encounter: Payer: Self-pay | Admitting: Internal Medicine

## 2022-06-20 VITALS — BP 102/64 | HR 90 | Temp 96.9°F | Wt 241.0 lb

## 2022-06-20 DIAGNOSIS — Z853 Personal history of malignant neoplasm of breast: Secondary | ICD-10-CM | POA: Diagnosis not present

## 2022-06-20 DIAGNOSIS — G8929 Other chronic pain: Secondary | ICD-10-CM | POA: Diagnosis not present

## 2022-06-20 DIAGNOSIS — E1165 Type 2 diabetes mellitus with hyperglycemia: Secondary | ICD-10-CM | POA: Diagnosis not present

## 2022-06-20 DIAGNOSIS — M25511 Pain in right shoulder: Secondary | ICD-10-CM | POA: Insufficient documentation

## 2022-06-20 DIAGNOSIS — D508 Other iron deficiency anemias: Secondary | ICD-10-CM | POA: Diagnosis not present

## 2022-06-20 DIAGNOSIS — G620 Drug-induced polyneuropathy: Secondary | ICD-10-CM | POA: Diagnosis not present

## 2022-06-20 DIAGNOSIS — E785 Hyperlipidemia, unspecified: Secondary | ICD-10-CM

## 2022-06-20 DIAGNOSIS — Z6838 Body mass index (BMI) 38.0-38.9, adult: Secondary | ICD-10-CM | POA: Diagnosis not present

## 2022-06-20 DIAGNOSIS — E1169 Type 2 diabetes mellitus with other specified complication: Secondary | ICD-10-CM | POA: Diagnosis not present

## 2022-06-20 DIAGNOSIS — M170 Bilateral primary osteoarthritis of knee: Secondary | ICD-10-CM

## 2022-06-20 DIAGNOSIS — T451X5A Adverse effect of antineoplastic and immunosuppressive drugs, initial encounter: Secondary | ICD-10-CM

## 2022-06-20 DIAGNOSIS — N1831 Chronic kidney disease, stage 3a: Secondary | ICD-10-CM | POA: Diagnosis not present

## 2022-06-20 DIAGNOSIS — I10 Essential (primary) hypertension: Secondary | ICD-10-CM | POA: Diagnosis not present

## 2022-06-20 LAB — POCT GLYCOSYLATED HEMOGLOBIN (HGB A1C): Hemoglobin A1C: 6.8 % — AB (ref 4.0–5.6)

## 2022-06-20 NOTE — Therapy (Signed)
OUTPATIENT PHYSICAL THERAPY TREATMENT NOTE   Patient Name: Chelsea Hudson MRN: [id number] DOB:1966/07/11, 56 y.o., female Today's Date: 06/20/2022  PCP: Webb Silversmith NP REFERRING PROVIDER: Earlie Server MD  END OF SESSION:   PT End of Session - 06/20/22 0758     Visit Number 4    Number of Visits 17    Date for PT Re-Evaluation 08/04/22    Authorization - Visit Number 4    Authorization - Number of Visits 17    Progress Note Due on Visit 10    PT Start Time 0753    PT Stop Time 0831    PT Time Calculation (min) 38 min    Activity Tolerance Patient tolerated treatment well    Behavior During Therapy [facility] for tasks assessed/performed               Past Medical History:  Diagnosis Date   Arthritis    SHOULDER   Breast cancer ([place]) 02/2017   rt breast   Breast cancer ([place]) 2018   Cancer ([place]) 02/28/2017   INVASIVE MAMMARY CARCINOMA WITH MUCINOUS FEATURES.   Colonic diverticular abscess 06/21/2017   Colonoscopy 06/25/2017: No evidence of malignancy.   Diabetes mellitus without complication ([place])    Hypertension    Irregular heart beat    PT STATES IT "SKIPS A BEAT"    Obesity    Personal history of chemotherapy    Personal history of radiation therapy    Past Surgical History:  Procedure Laterality Date   ANKLE SURGERY     BREAST BIOPSY Right 02/28/2017   INVASIVE MAMMARY CARCINOMA WITH MUCINOUS FEATURES.   BREAST CYST ASPIRATION Right    NEG   COLONOSCOPY WITH PROPOFOL N/A 06/25/2017   Procedure: COLONOSCOPY WITH PROPOFOL;  Surgeon: Jonathon Bellows, MD;  Location: [facility] ENDOSCOPY;  Service: Gastroenterology;  Laterality: N/A;   MASTECTOMY, PARTIAL Right 07/30/2017   Procedure: MASTECTOMY PARTIAL;  Surgeon: Robert Bellow, MD;  Location: [facility] ORS;  Service: General;  Laterality: Right;   PORTACATH PLACEMENT Left 03/15/2017   Procedure: INSERTION PORT-A-CATH;  Surgeon: Robert Bellow, MD;  Location: [facility] ORS;  Service: General;  Laterality: Left;   RE-EXCISION OF  BREAST LUMPECTOMY Right 08/17/2017    INVASIVE CARCINOMA EXTENDS TO NEW LATERAL MARGIN. /RE-EXCISION OF BREAST LUMPECTOMY;: Byrnett, Forest Gleason, MD;  [facility] ORS; General;  Laterality: Right;   SENTINEL NODE BIOPSY Right 07/30/2017   Procedure: SENTINEL NODE BIOPSY;  Surgeon: Robert Bellow, MD;  Location: [facility] ORS;  Service: General;  Laterality: Right;   SIMPLE MASTECTOMY WITH AXILLARY SENTINEL NODE BIOPSY Right 09/28/2017   Procedure: SIMPLE MASTECTOMY;  Surgeon: Robert Bellow, MD;  Location: [facility] ORS;  Service: General;  Laterality: Right;   Patient Active Problem List   Diagnosis Date Noted   Aromatase inhibitor use 05/09/2022   Elevated serum creatinine 05/09/2022   Osteoarthritis 06/09/2021   Anemia 06/09/2021   Class 2 obesity due to excess calories with body mass index (BMI) of 36.0 to 36.9 in adult 06/09/2021   Hyperlipidemia associated with type 2 diabetes mellitus ([place]) 10/02/2018   Neuropathy due to chemotherapeutic drug ([place]) 10/02/2018   Controlled type 2 diabetes mellitus with hyperglycemia ([place]) 07/05/2018   Essential hypertension 01/05/2016    REFERRING DIAG: Chronic R shoulder pain   THERAPY DIAG:  Chronic right shoulder pain  Rationale for Evaluation and Treatment Rehabilitation  PERTINENT HISTORY: Pt is a 56 year old female familar with this clinic. Insideous onset in May of this year  started having some increased swelling and pain in her R shoulder, and was not able to lift it as high as the L. 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 Has difficulty lifting herself up into the truck (grabbing overhead handle). She has difficulty with overhead ADLS (reaching into overhead cabinet, cleaning her TV and windows. Pt completes line dancing 3x/week for exercises. Is  still wanting to complete R breast reconstruction, but per patient MD would like her to lose more weight prior. Pt is R handed. Pt denies N/V, B&B changes, unexplained weight fluctuation, saddle paresthesia, fever, night sweats, or unrelenting night pain at this time.  PRECAUTIONS: none  SUBJECTIVE: Some soreness from last visit. Reports no pain. Patient reports compliance with HEP   PAIN:  Are you having pain? No   OBJECTIVE: (objective measures completed at initial evaluation unless otherwise dated)  Ther-Ex Pulleys flex and abd x12 each  [employer] with cuing for scapular retraction with good carry over Pullover dowel with 7# dowel x12 with min cuing for technique with good carry over Scapular retraction <> protraction in supine 9d BUE flex7# dowel 2 x12  Lat pull down 20# 2x 10 with good carry over of initial demo  Post capsule stretch x30sec Doorway pec stretch 30sec   Manual STM with trigger point release to latissimus dorsi G3 mob with movement in all directions  PROM all directions  Clinical Impression: PT continued therex progression for increased shoulder mobility and strengthening with success. Pt is able to comply with all cuing for proper technique of therex with excellent motivation throughout session.  Pt continues to demonstrate increased overhead mobility between sessions. PT continued to encourage patient in HEP and scar massage to maintain mobility. PT will continue progression as able.      PT Short Term Goals - 06/06/22 1031       PT SHORT TERM GOAL #1   Title Pt will be independent with HEP in order to decrease shoulder pain and increase strength in order to improve pain-free function at home and work.    Baseline 06/06/22 HEP given    Time 5    Period Weeks    Status New              PT Long Term Goals - 06/06/22 1032       PT LONG TERM GOAL #1   Title Patient will increase FOTO score to 59 to demonstrate predicted increase in functional  mobility to complete ADLs    Baseline 06/06/22 46    Time 8    Period Weeks    Status New      PT LONG TERM GOAL #2   Title Pt will demonstrate full R shoulder AROM in order to complete overhead and self care ADLs.    Baseline 06/06/22 flex: 103d abd 94d ER Apleys C3    Time 8    Period Weeks    Status New      PT LONG TERM GOAL #3   Title Pt will demonstrate 4+/5 gross shoulder and periscapular strength in order to complete heavy household ADLs    Baseline 06/06/22 R/L Flex 4-/5; ER 4+/5; Y lower trap 3+/4; T scap retractors 4-/4+    Time 8    Period Weeks  Status New               Durwin Reges DPT Durwin Reges, PT 06/20/2022, 3:15 PM

## 2022-06-20 NOTE — Progress Notes (Signed)
Subjective:    Patient ID: Chelsea Hudson, female    DOB: 1966-03-10, 56 y.o.   MRN: [id number]  HPI  Patient presents to clinic today for follow-up of chronic conditions.  HTN: Her BP today is 102/76.  She is taking Triamterene HCT and Potassium as prescribed.  ECG from 12/2017 reviewed.  OA: Mainly in her knees.  She takes Tylenol OTC as needed with good relief of symptoms.  History of Right Breast Cancer: Status post mastectomy, lymph node excision, chemo and radiation.  She is taking Letrozole as prescribed.  She follows with oncology.  Chemotherapy-Induced Neuropathy: Managed with Gabapentin with good relief of symptoms.  DM2: Her last A1c was 6.4%, 11/2021.  She is taking Metformin as prescribed.  She does not check her sugars.  She checks her feet routinely.  Her last eye exam was  05/2022.  She does not take flu, Pneumovax or COVID vaccines.  HLD: Her last LDL was 68, triglycerides 108, 11/2021.  She is not taking any cholesterol-lowering medication at this time.  She does not consume a low-fat diet.  Anemia: Her last H/H was 11.4/36.5, 04/2022.  She is not currently taking any oral Iron at this time.  She does not follow with hematology.  CKD 3: Her last creatinine was 1.52, GFR 23, 04/2022.  She is not currently on an ACEI/ARB.  She does not follow with nephrology.  Review of Systems   Past Medical History:  Diagnosis Date   Arthritis    SHOULDER   Breast cancer ([place]) 02/2017   rt breast   Breast cancer ([place]) 2018   Cancer ([place]) 02/28/2017   INVASIVE MAMMARY CARCINOMA WITH MUCINOUS FEATURES.   Colonic diverticular abscess 06/21/2017   Colonoscopy 06/25/2017: No evidence of malignancy.   Diabetes mellitus without complication ( [place])    Hypertension    Irregular heart beat    PT STATES IT "SKIPS A BEAT"    Obesity    Personal history of chemotherapy    Personal history of radiation therapy     Current Outpatient Medications  Medication Sig Dispense Refill    atorvastatin (LIPITOR) 10 MG tablet TAKE 1 TABLET BY MOUTH DAILY 100 tablet 2   baclofen (LIORESAL) 10 MG tablet Take 0.5-1 tablets (5-10 mg total) by mouth 2 (two) times daily as needed for muscle spasms. 30 each 1   furosemide (LASIX) 20 MG tablet Take 1 tablet (20 mg total) by mouth daily. (Patient not taking: Reported on 03/06/2022) 3 tablet 0   gabapentin (NEURONTIN) 400 MG capsule TAKE 1 CAPSULE BY MOUTH AT  BEDTIME 90 capsule 3   letrozole (FEMARA) 2.5 MG tablet Take 1 tablet (2.5 mg total) by mouth daily. 90 tablet 1   lisinopril (ZESTRIL) 5 MG tablet TAKE 1 TABLET BY MOUTH DAILY 100 tablet 1   metFORMIN (GLUCOPHAGE) 500 MG tablet TAKE 2 TABLETS BY MOUTH ONCE  DAILY WITH BREAKFAST AND 1  TABLET BY MOUTH WITH SUPPER 300 tablet 1   naproxen (NAPROSYN) 500 MG tablet Take 1 tablet (500 mg total) by mouth 2 (two) times daily with a meal. For 1-2 weeks then as needed 60 tablet 1   potassium chloride (KLOR-CON) 10 MEQ tablet TAKE 1 TABLET BY MOUTH TWICE  DAILY 180 tablet 3   triamterene-hydrochlorothiazide (MAXZIDE-25) 37.5-25 MG tablet TAKE 1 TABLET BY MOUTH DAILY 100 tablet 1   No current facility-administered medications for this visit.    Allergies  Allergen Reactions   Other Hives and Itching  [name]     Family History  Problem Relation Age of Onset   Diabetes Father    Stroke Father    Hypertension Father    Hypertension Mother    Brain cancer Maternal Aunt 5   Diabetes Sister    Colon cancer Neg Hx    Breast cancer Neg Hx     Social History   Socioeconomic History   Marital status: Married    Spouse name: Not on file   Number of children: Not on file   Years of education: Not on file   Highest education level: Not on file  Occupational History   Not on file  Tobacco Use   Smoking status: Former    Packs/day: 0.25    Years: 25.00    Total pack years: 6.25     Types: Cigarettes    Quit date: 10/20/2017    Years since quitting: 4.6   Smokeless tobacco: Former  Scientific laboratory technician Use: Never used  Substance and Sexual Activity   Alcohol use: Not Currently    Comment: occas   Drug use: No   Sexual activity: Not Currently  Other Topics Concern   Not on file  Social History Narrative   Not on file   Social Determinants of Health   Financial Resource Strain: Low Risk  (03/06/2022)   Overall Financial Resource Strain (CARDIA)    Difficulty of Paying Living Expenses: Not hard at all  Food Insecurity: No Food Insecurity (03/06/2022)   Hunger Vital Sign    Worried About Running Out of Food in the Last Year: Never true    [place] in the Last Year: Never true  Transportation Needs: No Transportation Needs (03/06/2022)   PRAPARE - Hydrologist (Medical): No    Lack of Transportation (Non-Medical): No  Physical Activity: Sufficiently Active (03/06/2022)   Exercise Vital Sign    Days of Exercise per Week: 3 days    Minutes of Exercise per Session: 60 min  Stress: No Stress Concern Present (03/06/2022)   [place]    Feeling of Stress : Not at all  Social Connections: [place] (03/06/2022)   Social Connection and Isolation Panel [NHANES]    Frequency of Communication with Friends and Family: More than three times a week    Frequency of Social Gatherings with Friends and Family: Twice a week    Attends Religious Services: More than 4 times per year    Active Member of [employer] or Organizations: Yes    Attends Music therapist: More than 4 times per year    Marital Status: Married  Human resources officer Violence: Not on file     Constitutional: Denies fever, malaise, fatigue, headache or abrupt weight changes.  HEENT: Denies eye pain, eye redness, ear pain, ringing in the ears, wax buildup, runny nose, nasal congestion, bloody  nose, or sore throat. Respiratory: Denies difficulty breathing, shortness of breath, cough or sputum production.   Cardiovascular: Denies chest pain, chest tightness, palpitations or swelling in the hands or feet.  Gastrointestinal: Denies abdominal pain, bloating, constipation, diarrhea or blood in the stool.  GU: Denies urgency, frequency, pain with urination, burning sensation, blood in urine, odor or discharge. Musculoskeletal: Patient reports joint pain in knees.  Denies decrease in range of motion, difficulty with  gait, muscle pain or joint swelling.  Skin: Denies redness, rashes, lesions or ulcercations.  Neurological: Patient reports neuropathic pain.  Denies dizziness, difficulty with memory, difficulty with speech or problems with balance and coordination.  Psych: Denies anxiety, depression, SI/HI.  No other specific complaints in a complete review of systems (except as listed in HPI above).    Objective:   Physical Exam  .BP 102/64 (BP Location: Left Arm, Patient Position: Sitting, Cuff Size: Large)   Pulse 90   Temp (!) 96.9 F (36.1 C) (Temporal)   Wt 241 lb (109.3 kg)   LMP 01/31/2014 (Approximate) Comment: LMP was 3 years ago.  SpO2 100%   BMI 38.90 kg/m   Wt Readings from Last 3 Encounters:  05/09/22 244 lb 4.8 oz (110.8 kg)  05/02/22 241 lb 6.4 oz (109.5 kg)  12/20/21 227 lb (103 kg)    General: Appears her stated age, obese, in NAD. Skin: Warm, dry and intact. No  ulcerations noted. HEENT: Head: normal shape and size; Eyes: sclera white, no icterus, conjunctiva pink, PERRLA and EOMs intact;  Cardiovascular: Normal rate and rhythm. S1,S2 noted.  No murmur, rubs or gallops noted.  Trace pitting BLE edema. No carotid bruits noted. Pulmonary/Chest: Normal effort and positive vesicular breath sounds. No respiratory distress. No wheezes, rales or ronchi noted.  Musculoskeletal: No difficulty with gait.  Neurological: Alert and oriented.  Psychiatric: Mood and affect  mildly flat.  Behavior is normal. Judgment and thought content normal.   BMET    Component Value Date/Time   NA 137 05/09/2022 1343   K 3.8 05/09/2022 1343   CL 103 05/09/2022 1343   CO2 26 05/09/2022 1343   GLUCOSE 140 (H) 05/09/2022 1343   BUN 24 (H) 05/09/2022 1343   CREATININE 1.24 (H) 05/09/2022 1343   CREATININE 1.23 (H) 12/20/2021 1531   CALCIUM 9.1 05/09/2022 1343   GFRNONAA 52 (L) 05/09/2022 1343   GFRNONAA 77 03/22/2020 0802   GFRAA 89 03/22/2020 0802    Lipid Panel     Component Value Date/Time   CHOL 121 12/20/2021 1531   CHOL 168 03/01/2021 1649   TRIG 108 12/20/2021 1531   HDL 34 (L) 12/20/2021 1531   HDL 33 (L) 03/01/2021 1649   CHOLHDL 3.6 12/20/2021 1531   VLDL 23 04/04/2017 0941   LDLCALC 68 12/20/2021 1531    CBC    Component Value Date/Time   WBC 8.4 05/09/2022 1343   RBC 4.38 05/09/2022 1343   HGB 11.4 (L) 05/09/2022 1343   HCT 36.5 05/09/2022 1343   PLT 336 05/09/2022 1343   MCV 83.3 05/09/2022 1343   MCH 26.0 05/09/2022 1343   MCHC 31.2 05/09/2022 1343   RDW 17.4 (H) 05/09/2022 1343   LYMPHSABS 1.7 05/09/2022 1343   MONOABS 0.3 05/09/2022 1343   EOSABS 0.3 05/09/2022 1343   BASOSABS 0.0 05/09/2022 1343    Hgb A1C Lab Results  Component Value Date   HGBA1C 6.4 (H) 12/20/2021           Assessment & Plan:    RTC in 6 months for annual exam Webb Silversmith, NP

## 2022-06-20 NOTE — Assessment & Plan Note (Signed)
Recent CBC reviewed

## 2022-06-20 NOTE — Assessment & Plan Note (Signed)
Continue gabapentin.

## 2022-06-20 NOTE — Assessment & Plan Note (Signed)
C-Met and lipid profile today Encouraged her to consume a low-fat diet 

## 2022-06-20 NOTE — Patient Instructions (Signed)

## 2022-06-20 NOTE — Assessment & Plan Note (Signed)
Encourage weight loss as this can help reduce joint pain Continue Tylenol OTC as needed 

## 2022-06-20 NOTE — Assessment & Plan Note (Signed)
Controlled on triamterene HCT and potassium Reinforced DASH diet and exercise weight loss C-Met today

## 2022-06-20 NOTE — Assessment & Plan Note (Signed)
Continue letrozole She will continue to follow with oncology 

## 2022-06-20 NOTE — Assessment & Plan Note (Signed)
C-Met today 

## 2022-06-20 NOTE — Assessment & Plan Note (Signed)
Encourage diet and exercise for weight loss 

## 2022-06-20 NOTE — Assessment & Plan Note (Signed)
POCT A1c 6.8% Encouraged her to consume a low-carb diet and exercise for weight loss We will request copy of eye exam Encourage routine foot exams She declines immunizations Continue metformin

## 2022-06-21 ENCOUNTER — Ambulatory Visit: Payer: Medicaid Other | Admitting: Physical Therapy

## 2022-06-21 LAB — COMPLETE METABOLIC PANEL WITH GFR
AG Ratio: 1.3 (calc) (ref 1.0–2.5)
ALT: 16 U/L (ref 6–29)
AST: 15 U/L (ref 10–35)
Albumin: 3.9 g/dL (ref 3.6–5.1)
Alkaline phosphatase (APISO): 92 U/L (ref 37–153)
BUN/Creatinine Ratio: 22 (calc) (ref 6–22)
BUN: 26 mg/dL — ABNORMAL HIGH (ref 7–25)
CO2: 27 mmol/L (ref 20–32)
Calcium: 9.9 mg/dL (ref 8.6–10.4)
Chloride: 104 mmol/L (ref 98–110)
Creat: 1.19 mg/dL — ABNORMAL HIGH (ref 0.50–1.03)
Globulin: 2.9 g/dL (calc) (ref 1.9–3.7)
Glucose, Bld: 109 mg/dL (ref 65–139)
Potassium: 4.1 mmol/L (ref 3.5–5.3)
Sodium: 140 mmol/L (ref 135–146)
Total Bilirubin: 0.2 mg/dL (ref 0.2–1.2)
Total Protein: 6.8 g/dL (ref 6.1–8.1)
eGFR: 54 mL/min/{1.73_m2} — ABNORMAL LOW (ref >=60)

## 2022-06-21 LAB — LIPID PANEL
Cholesterol: 117 mg/dL (ref <200)
HDL: 32 mg/dL — ABNORMAL LOW (ref >=50)
LDL Cholesterol (Calc): 64 mg/dL (calc)
Non-HDL Cholesterol (Calc): 85 mg/dL (calc) (ref <130)
Total CHOL/HDL Ratio: 3.7 (calc) (ref <5.0)
Triglycerides: 119 mg/dL (ref <150)

## 2022-06-22 ENCOUNTER — Ambulatory Visit: Payer: Medicare Other | Admitting: Physical Therapy

## 2022-06-22 ENCOUNTER — Encounter: Payer: Self-pay | Admitting: Physical Therapy

## 2022-06-22 DIAGNOSIS — G8929 Other chronic pain: Secondary | ICD-10-CM | POA: Diagnosis not present

## 2022-06-22 DIAGNOSIS — M25511 Pain in right shoulder: Secondary | ICD-10-CM | POA: Diagnosis not present

## 2022-06-22 NOTE — Therapy (Signed)
OUTPATIENT PHYSICAL THERAPY TREATMENT NOTE   Patient Name: Chelsea Hudson MRN: [id number] DOB:1966/07/11, 56 y.o., female Today's Date: 06/20/2022  PCP: Webb Silversmith NP REFERRING PROVIDER: Earlie Server MD  END OF SESSION:   PT End of Session - 06/20/22 0758     Visit Number 4    Number of Visits 17    Date for PT Re-Evaluation 08/04/22    Authorization - Visit Number 4    Authorization - Number of Visits 17    Progress Note Due on Visit 10    PT Start Time 0753    PT Stop Time 0831    PT Time Calculation (min) 38 min    Activity Tolerance Patient tolerated treatment well    Behavior During Therapy [facility] for tasks assessed/performed               Past Medical History:  Diagnosis Date   Arthritis    SHOULDER   Breast cancer ([place]) 02/2017   rt breast   Breast cancer ([place]) 2018   Cancer ([place]) 02/28/2017   INVASIVE MAMMARY CARCINOMA WITH MUCINOUS FEATURES.   Colonic diverticular abscess 06/21/2017   Colonoscopy 06/25/2017: No evidence of malignancy.   Diabetes mellitus without complication ([place])    Hypertension    Irregular heart beat    PT STATES IT "SKIPS A BEAT"    Obesity    Personal history of chemotherapy    Personal history of radiation therapy    Past Surgical History:  Procedure Laterality Date   ANKLE SURGERY     BREAST BIOPSY Right 02/28/2017   INVASIVE MAMMARY CARCINOMA WITH MUCINOUS FEATURES.   BREAST CYST ASPIRATION Right    NEG   COLONOSCOPY WITH PROPOFOL N/A 06/25/2017   Procedure: COLONOSCOPY WITH PROPOFOL;  Surgeon: Jonathon Bellows, MD;  Location: [facility] ENDOSCOPY;  Service: Gastroenterology;  Laterality: N/A;   MASTECTOMY, PARTIAL Right 07/30/2017   Procedure: MASTECTOMY PARTIAL;  Surgeon: Robert Bellow, MD;  Location: [facility] ORS;  Service: General;  Laterality: Right;   PORTACATH PLACEMENT Left 03/15/2017   Procedure: INSERTION PORT-A-CATH;  Surgeon: Robert Bellow, MD;  Location: [facility] ORS;  Service: General;  Laterality: Left;   RE-EXCISION OF  BREAST LUMPECTOMY Right 08/17/2017    INVASIVE CARCINOMA EXTENDS TO NEW LATERAL MARGIN. /RE-EXCISION OF BREAST LUMPECTOMY;: Byrnett, Forest Gleason, MD;  [facility] ORS; General;  Laterality: Right;   SENTINEL NODE BIOPSY Right 07/30/2017   Procedure: SENTINEL NODE BIOPSY;  Surgeon: Robert Bellow, MD;  Location: [facility] ORS;  Service: General;  Laterality: Right;   SIMPLE MASTECTOMY WITH AXILLARY SENTINEL NODE BIOPSY Right 09/28/2017   Procedure: SIMPLE MASTECTOMY;  Surgeon: Robert Bellow, MD;  Location: [facility] ORS;  Service: General;  Laterality: Right;   Patient Active Problem List   Diagnosis Date Noted   Aromatase inhibitor use 05/09/2022   Elevated serum creatinine 05/09/2022   Osteoarthritis 06/09/2021   Anemia 06/09/2021   Class 2 obesity due to excess calories with body mass index (BMI) of 36.0 to 36.9 in adult 06/09/2021   Hyperlipidemia associated with type 2 diabetes mellitus ([place]) 10/02/2018   Neuropathy due to chemotherapeutic drug ([place]) 10/02/2018   Controlled type 2 diabetes mellitus with hyperglycemia ([place]) 07/05/2018   Essential hypertension 01/05/2016    REFERRING DIAG: Chronic R shoulder pain   THERAPY DIAG:  Chronic right shoulder pain  Rationale for Evaluation and Treatment Rehabilitation  PERTINENT HISTORY: Pt is a 56 year old female familar with this clinic. Insideous onset in May of this year  started having some increased swelling and pain in her R shoulder, and was not able to lift it as high as the L. 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 Has difficulty lifting herself up into the truck (grabbing overhead handle). She has difficulty with overhead ADLS (reaching into overhead cabinet, cleaning her TV and windows. Pt completes line dancing 3x/week for exercises. Is  still wanting to complete R breast reconstruction, but per patient MD would like her to lose more weight prior. Pt is R handed. Pt denies N/V, B&B changes, unexplained weight fluctuation, saddle paresthesia, fever, night sweats, or unrelenting night pain at this time.  PRECAUTIONS: none  SUBJECTIVE: No pain on arrival. Reports compliance with HEP. Some increase in motion noted.  PAIN:  Are you having pain? No   OBJECTIVE: (objective measures completed at initial evaluation unless otherwise dated)  Ther-Ex Pulleys flex and abd x12 each  [employer] with cuing for scapular retraction with good carry over Fwd flex walks x12 with good carry over of demo Y on wall 2x8 with good carry over of demo Lat pullover 7# dowel 2 x10 with increased mobility from previous session Post capsule stretch x30sec Doorway pec stretch 30sec   Manual STM with trigger point release to latissimus dorsi G3 mob with movement in all directions  PROM all directions  Clinical Impression: PT continued therex progression for increased shoulder mobility and strengthening with success. Pt is able to comply with all cuing for proper technique of therex with excellent motivation throughout session.  Pt is continuing to demonstrate excellent increased in mobility, with near full AAROM flexion.  Pt continues to tolerate increased pressure to manual techniques. PT will continue progression as able.      PT Short Term Goals - 06/06/22 1031       PT SHORT TERM GOAL #1   Title Pt will be independent with HEP in order to decrease shoulder pain and increase strength in order to improve pain-free function at home and work.    Baseline 06/06/22 HEP given    Time 5    Period Weeks    Status New              PT Long Term Goals - 06/06/22 1032       PT LONG TERM GOAL #1   Title Patient will increase FOTO score to 59 to demonstrate predicted increase in functional mobility to complete ADLs    Baseline 06/06/22 46     Time 8    Period Weeks    Status New      PT LONG TERM GOAL #2   Title Pt will demonstrate full R shoulder AROM in order to complete overhead and self care ADLs.    Baseline 06/06/22 flex: 103d abd 94d ER Apleys C3    Time 8    Period Weeks    Status New      PT LONG TERM GOAL #3   Title Pt will demonstrate 4+/5 gross shoulder and periscapular strength in order to complete heavy household ADLs    Baseline 06/06/22 R/L Flex 4-/5; ER 4+/5; Y lower trap 3+/4; T scap retractors 4-/4+    Time 8    Period Weeks    Status New  Durwin Reges DPT Durwin Reges, PT 06/20/2022, 3:15 PM   [facility] [address] [place], [address] Phone: [phone]   Fax:  [phone]  Physical Therapy Treatment  Patient Details  Name: Chelsea Hudson MRN: [id number] Date of Birth: Dec 26, 1965 No data recorded  Encounter Date: 06/22/2022   PT End of Session - 06/22/22 0858     Visit Number 5    Number of Visits 17    Date for PT Re-Evaluation 08/04/22    Authorization Time Period 3 visits 08/12/20 - 09/10/20    Authorization - Visit Number 5    Authorization - Number of Visits 17    Progress Note Due on Visit 10    PT Start Time 0750    PT Stop Time 0828    PT Time Calculation (min) 38 min    Activity Tolerance Patient tolerated treatment well    Behavior During Therapy [facility] for tasks assessed/performed             Past Medical History:  Diagnosis Date   Arthritis    SHOULDER   Breast cancer ([place]) 02/2017   rt breast   Breast cancer ([place]) 2018   Cancer ([place]) 02/28/2017   INVASIVE MAMMARY CARCINOMA WITH MUCINOUS FEATURES.   Colonic diverticular abscess 06/21/2017   Colonoscopy 06/25/2017: No evidence of malignancy.   Diabetes mellitus without complication ([place])    Hypertension    Irregular heart beat    PT STATES IT "SKIPS A BEAT"    Obesity    Personal history of chemotherapy     Personal history of radiation therapy     Past Surgical History:  Procedure Laterality Date   ANKLE SURGERY     BREAST BIOPSY Right 02/28/2017   INVASIVE MAMMARY CARCINOMA WITH MUCINOUS FEATURES.   BREAST CYST ASPIRATION Right    NEG   COLONOSCOPY WITH PROPOFOL N/A 06/25/2017   Procedure: COLONOSCOPY WITH PROPOFOL;  Surgeon: Jonathon Bellows, MD;  Location: [facility] ENDOSCOPY;  Service: Gastroenterology;  Laterality: N/A;   MASTECTOMY, PARTIAL Right 07/30/2017   Procedure: MASTECTOMY PARTIAL;  Surgeon: Robert Bellow, MD;  Location: [facility] ORS;  Service: General;  Laterality: Right;   PORTACATH PLACEMENT Left 03/15/2017   Procedure: INSERTION PORT-A-CATH;  Surgeon: Robert Bellow, MD;  Location: [facility] ORS;  Service: General;  Laterality: Left;   RE-EXCISION OF BREAST LUMPECTOMY Right 08/17/2017    INVASIVE CARCINOMA EXTENDS TO NEW LATERAL MARGIN. /RE-EXCISION OF BREAST LUMPECTOMY;: Robert Bellow, MD;  [facility]; General;  Laterality: Right;   SENTINEL NODE BIOPSY Right 07/30/2017   Procedure: SENTINEL NODE BIOPSY;  Surgeon: Robert Bellow, MD;  Location: [facility] ORS;  Service: General;  Laterality: Right;   SIMPLE MASTECTOMY WITH AXILLARY SENTINEL NODE BIOPSY Right 09/28/2017   Procedure: SIMPLE MASTECTOMY;  Surgeon: Robert Bellow, MD;  Location: [facility] ORS;  Service: General;  Laterality: Right;    There were no vitals filed for this visit.                                 PT Short Term Goals - 06/06/22 1031       PT SHORT TERM GOAL #1   Title Pt will be independent with HEP in order to decrease shoulder pain and increase strength in order to improve pain-free function at home and work.    Baseline 06/06/22 HEP given  Time 5    Period Weeks    Status New               PT Long Term Goals - 06/06/22 1032       PT LONG TERM GOAL #1   Title Patient will increase FOTO score to 59 to demonstrate predicted increase in functional mobility to  complete ADLs    Baseline 06/06/22 46    Time 8    Period Weeks    Status New      PT LONG TERM GOAL #2   Title Pt will demonstrate full R shoulder AROM in order to complete overhead and self care ADLs.    Baseline 06/06/22 flex: 103d abd 94d ER Apleys C3    Time 8    Period Weeks    Status New      PT LONG TERM GOAL #3   Title Pt will demonstrate 4+/5 gross shoulder and periscapular strength in order to complete heavy household ADLs    Baseline 06/06/22 R/L Flex 4-/5; ER 4+/5; Y lower trap 3+/4; T scap retractors 4-/4+    Time 8    Period Weeks    Status New                    Patient will benefit from skilled therapeutic intervention in order to improve the following deficits and impairments:     Visit Diagnosis: Chronic right shoulder pain     Problem List Patient Active Problem List   Diagnosis Date Noted   Stage 3a chronic kidney disease ([place]) 06/20/2022   Osteoarthritis 06/09/2021   Anemia 06/09/2021   Class 2 obesity due to excess calories with body mass index (BMI) of 38.0 to 38.9 in adult 06/09/2021   History of breast cancer 07/01/2020   Hyperlipidemia associated with type 2 diabetes mellitus ([place]) 10/02/2018   Neuropathy due to chemotherapeutic drug ([place]) 10/02/2018   Controlled type 2 diabetes mellitus with hyperglycemia ([place]) 07/05/2018   Essential hypertension 01/05/2016   Durwin Reges DPT Durwin Reges, PT 06/22/2022, 10:26 AM  [facility] [address] [place], [address] Phone: [phone]   Fax:  [phone]  Name: Chelsea Hudson MRN: [id number] Date of Birth: 04/18/1966

## 2022-06-27 ENCOUNTER — Ambulatory Visit: Payer: Medicare Other | Admitting: Physical Therapy

## 2022-06-27 ENCOUNTER — Encounter: Payer: Self-pay | Admitting: Physical Therapy

## 2022-06-27 DIAGNOSIS — G8929 Other chronic pain: Secondary | ICD-10-CM | POA: Diagnosis not present

## 2022-06-27 DIAGNOSIS — M25511 Pain in right shoulder: Secondary | ICD-10-CM | POA: Diagnosis not present

## 2022-06-27 NOTE — Therapy (Signed)
OUTPATIENT PHYSICAL THERAPY TREATMENT NOTE   Patient Name: Chelsea Hudson MRN: [id number] DOB:1966/07/11, 56 y.o., female Today's Date: 06/20/2022  PCP: Webb Silversmith NP REFERRING PROVIDER: Earlie Server MD  END OF SESSION:   PT End of Session - 06/20/22 0758     Visit Number 4    Number of Visits 17    Date for PT Re-Evaluation 08/04/22    Authorization - Visit Number 4    Authorization - Number of Visits 17    Progress Note Due on Visit 10    PT Start Time 0753    PT Stop Time 0831    PT Time Calculation (min) 38 min    Activity Tolerance Patient tolerated treatment well    Behavior During Therapy [facility] for tasks assessed/performed               Past Medical History:  Diagnosis Date   Arthritis    SHOULDER   Breast cancer ([place]) 02/2017   rt breast   Breast cancer ([place]) 2018   Cancer ([place]) 02/28/2017   INVASIVE MAMMARY CARCINOMA WITH MUCINOUS FEATURES.   Colonic diverticular abscess 06/21/2017   Colonoscopy 06/25/2017: No evidence of malignancy.   Diabetes mellitus without complication ([place])    Hypertension    Irregular heart beat    PT STATES IT "SKIPS A BEAT"    Obesity    Personal history of chemotherapy    Personal history of radiation therapy    Past Surgical History:  Procedure Laterality Date   ANKLE SURGERY     BREAST BIOPSY Right 02/28/2017   INVASIVE MAMMARY CARCINOMA WITH MUCINOUS FEATURES.   BREAST CYST ASPIRATION Right    NEG   COLONOSCOPY WITH PROPOFOL N/A 06/25/2017   Procedure: COLONOSCOPY WITH PROPOFOL;  Surgeon: Jonathon Bellows, MD;  Location: [facility] ENDOSCOPY;  Service: Gastroenterology;  Laterality: N/A;   MASTECTOMY, PARTIAL Right 07/30/2017   Procedure: MASTECTOMY PARTIAL;  Surgeon: Robert Bellow, MD;  Location: [facility] ORS;  Service: General;  Laterality: Right;   PORTACATH PLACEMENT Left 03/15/2017   Procedure: INSERTION PORT-A-CATH;  Surgeon: Robert Bellow, MD;  Location: [facility] ORS;  Service: General;  Laterality: Left;   RE-EXCISION OF  BREAST LUMPECTOMY Right 08/17/2017    INVASIVE CARCINOMA EXTENDS TO NEW LATERAL MARGIN. /RE-EXCISION OF BREAST LUMPECTOMY;: Byrnett, Forest Gleason, MD;  [facility] ORS; General;  Laterality: Right;   SENTINEL NODE BIOPSY Right 07/30/2017   Procedure: SENTINEL NODE BIOPSY;  Surgeon: Robert Bellow, MD;  Location: [facility] ORS;  Service: General;  Laterality: Right;   SIMPLE MASTECTOMY WITH AXILLARY SENTINEL NODE BIOPSY Right 09/28/2017   Procedure: SIMPLE MASTECTOMY;  Surgeon: Robert Bellow, MD;  Location: [facility] ORS;  Service: General;  Laterality: Right;   Patient Active Problem List   Diagnosis Date Noted   Aromatase inhibitor use 05/09/2022   Elevated serum creatinine 05/09/2022   Osteoarthritis 06/09/2021   Anemia 06/09/2021   Class 2 obesity due to excess calories with body mass index (BMI) of 36.0 to 36.9 in adult 06/09/2021   Hyperlipidemia associated with type 2 diabetes mellitus ([place]) 10/02/2018   Neuropathy due to chemotherapeutic drug ([place]) 10/02/2018   Controlled type 2 diabetes mellitus with hyperglycemia ([place]) 07/05/2018   Essential hypertension 01/05/2016    REFERRING DIAG: Chronic R shoulder pain   THERAPY DIAG:  Chronic right shoulder pain  Rationale for Evaluation and Treatment Rehabilitation  PERTINENT HISTORY: Pt is a 56 year old female familar with this clinic. Insideous onset in May of this year  started having some increased swelling and pain in her R shoulder, and was not able to lift it as high as the L. 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 Has difficulty lifting herself up into the truck (grabbing overhead handle). She has difficulty with overhead ADLS (reaching into overhead cabinet, cleaning her TV and windows. Pt completes line dancing 3x/week for exercises. Is  still wanting to complete R breast reconstruction, but per patient MD would like her to lose more weight prior. Pt is R handed. Pt denies N/V, B&B changes, unexplained weight fluctuation, saddle paresthesia, fever, night sweats, or unrelenting night pain at this time.  PRECAUTIONS: none  SUBJECTIVE: No pain on arrival. Reports compliance with HEP. Some increase in motion noted.  PAIN:  Are you having pain? No   OBJECTIVE: (objective measures completed at initial evaluation unless otherwise dated)  Ther-Ex Pulleys flex and abd x12 each  [employer] with cuing for scapular retraction with good carry over Fwd flex walks x12 with good carry over of demo Y on wall 2x8 with good carry over of demo Lat pullover 7# dowel 2 x10 with increased mobility from previous session Post capsule stretch x30sec Doorway pec stretch 30sec   Manual STM with trigger point release to latissimus dorsi G3 mob with movement in all directions  PROM all directions  Clinical Impression: PT continued therex progression for increased shoulder mobility and strengthening with success. Pt is able to comply with all cuing for proper technique of therex with excellent motivation throughout session.  Pt is continuing to demonstrate excellent increased in mobility, with near full AAROM flexion.  Pt continues to tolerate increased pressure to manual techniques. PT will continue progression as able.      PT Short Term Goals - 06/06/22 1031       PT SHORT TERM GOAL #1   Title Pt will be independent with HEP in order to decrease shoulder pain and increase strength in order to improve pain-free function at home and work.    Baseline 06/06/22 HEP given    Time 5    Period Weeks    Status New              PT Long Term Goals - 06/06/22 1032       PT LONG TERM GOAL #1   Title Patient will increase FOTO score to 59 to demonstrate predicted increase in functional mobility to complete ADLs    Baseline 06/06/22 46     Time 8    Period Weeks    Status New      PT LONG TERM GOAL #2   Title Pt will demonstrate full R shoulder AROM in order to complete overhead and self care ADLs.    Baseline 06/06/22 flex: 103d abd 94d ER Apleys C3    Time 8    Period Weeks    Status New      PT LONG TERM GOAL #3   Title Pt will demonstrate 4+/5 gross shoulder and periscapular strength in order to complete heavy household ADLs    Baseline 06/06/22 R/L Flex 4-/5; ER 4+/5; Y lower trap 3+/4; T scap retractors 4-/4+    Time 8    Period Weeks    Status New  Durwin Reges DPT Durwin Reges, PT 06/20/2022, 3:15 PM   [place] [facility] [address] [place], [address] Phone: [phone]   Fax:  [phone]  Physical Therapy Treatment  Patient Details  Name: Chelsea Hudson MRN: [id number] Date of Birth: [birth year] No data recorded  Encounter Date: 06/22/2022   PT End of Session - 06/22/22 0858     Visit Number 5    Number of Visits 17    Date for PT Re-Evaluation 08/04/22    Authorization Time Period 3 visits 08/12/20 - 09/10/20    Authorization - Visit Number 5    Authorization - Number of Visits 17    Progress Note Due on Visit 10    PT Start Time 0750    PT Stop Time 0828    PT Time Calculation (min) 38 min    Activity Tolerance Patient tolerated treatment well    Behavior During Therapy [facility] for tasks assessed/performed             Past Medical History:  Diagnosis Date   Arthritis    SHOULDER   Breast cancer ([place]) 02/2017   rt breast   Breast cancer ([place]) 2018   Cancer ([place]) 02/28/2017   INVASIVE MAMMARY CARCINOMA WITH MUCINOUS FEATURES.   Colonic diverticular abscess 06/21/2017   Colonoscopy 06/25/2017: No evidence of malignancy.   Diabetes mellitus without complication ([place])    Hypertension    Irregular heart beat    PT STATES IT "SKIPS A BEAT"    Obesity    Personal history of chemotherapy     Personal history of radiation therapy     Past Surgical History:  Procedure Laterality Date   ANKLE SURGERY     BREAST BIOPSY Right 02/28/2017   INVASIVE MAMMARY CARCINOMA WITH MUCINOUS FEATURES.   BREAST CYST ASPIRATION Right    NEG   COLONOSCOPY WITH PROPOFOL N/A 06/25/2017   Procedure: COLONOSCOPY WITH PROPOFOL;  Surgeon: Jonathon Bellows, MD;  Location: [facility] ENDOSCOPY;  Service: Gastroenterology;  Laterality: N/A;   MASTECTOMY, PARTIAL Right 07/30/2017   Procedure: MASTECTOMY PARTIAL;  Surgeon: Robert Bellow, MD;  Location: [facility] ORS;  Service: General;  Laterality: Right;   PORTACATH PLACEMENT Left 03/15/2017   Procedure: INSERTION PORT-A-CATH;  Surgeon: Robert Bellow, MD;  Location: [facility] ORS;  Service: General;  Laterality: Left;   RE-EXCISION OF BREAST LUMPECTOMY Right 08/17/2017    INVASIVE CARCINOMA EXTENDS TO NEW LATERAL MARGIN. /RE-EXCISION OF BREAST LUMPECTOMY;: Robert Bellow, MD;  [place] [facility]; General;  Laterality: Right;   SENTINEL NODE BIOPSY Right 07/30/2017   Procedure: SENTINEL NODE BIOPSY;  Surgeon: Robert Bellow, MD;  Location: [facility] ORS;  Service: General;  Laterality: Right;   SIMPLE MASTECTOMY WITH AXILLARY SENTINEL NODE BIOPSY Right 09/28/2017   Procedure: SIMPLE MASTECTOMY;  Surgeon: Robert Bellow, MD;  Location: [facility];  Service: General;  Laterality: Right;    There were no vitals filed for this visit.    REFERRING DIAG: Chronic R shoulder pain    THERAPY DIAG:  Chronic right shoulder pain   Rationale for Evaluation and Treatment Rehabilitation   PERTINENT HISTORY: Pt is a 56 year old female familar with this clinic. Insideous onset in May of this year started having some increased swelling and pain in her R shoulder, and was not able to lift it as high as the L. Started using a pump machine for lymphadema, and the pain has gone away but her arm  feels heavy to lift.Pt has no pain at this time, mostly restricted by movement. 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 Has difficulty lifting herself up into the truck (grabbing overhead handle). She has difficulty with overhead ADLS (reaching into overhead cabinet, cleaning her TV and windows. Pt completes line dancing 3x/week for exercises. Is still wanting to complete R breast reconstruction, but per patient MD would like her to lose more weight prior. Pt is R handed. Pt denies N/V, B&B changes, unexplained weight fluctuation, saddle paresthesia, fever, night sweats, or unrelenting night pain at this time.   PRECAUTIONS: none   SUBJECTIVE: No pain on arrival. Reports compliance with HEP. Some increase in motion noted.   PAIN:  Are you having pain? No     OBJECTIVE: (objective measures completed at initial evaluation unless otherwise dated)   Ther-Ex Pulleys flex and abd x12 each  [employer] with cuing for scapular retraction with good carry over Lat pulldown 25# x10; 35# 2x 10 with good carry over following cuing for initial set Y on wall 2x 10with good carry over of demo  [employer] AAROM overhead flex x12 with feet anchored, cuing to prevent excessive thoracic ext compensation with good carry over Post capsule stretch x30sec Doorway pec stretch 30sec    Manual STM with trigger point release to latissimus dorsi G3 mob with movement in all directions  PROM all directions   Clinical Impression: PT continued therex progression for increased shoulder mobility and strengthening with success. Pt is able to comply with all cuing for proper technique of therex with excellent motivation throughout session.  Pt is continuing to demonstrate excellent increased in mobility, with near full AAROM flexion. Pt tolerates increase in progression of all therex without increased pain. Pt continues to tolerate increased pressure to manual techniques. PT will continue progression as able.                              PT Short Term  Goals - 06/06/22 1031       PT SHORT TERM GOAL #1   Title Pt will be independent with HEP in order to decrease shoulder pain and increase strength in order to improve pain-free function at home and work.    Baseline 06/06/22 HEP given    Time 5    Period Weeks    Status New               PT Long Term Goals - 06/06/22 1032       PT LONG TERM GOAL #1   Title Patient will increase FOTO score to 59 to demonstrate predicted increase in functional mobility to complete ADLs    Baseline 06/06/22 46    Time 8    Period Weeks    Status New      PT LONG TERM GOAL #2   Title Pt will demonstrate full R shoulder AROM in order to complete overhead and self care ADLs.    Baseline 06/06/22 flex: 103d abd 94d ER Apleys C3    Time 8    Period Weeks    Status New      PT LONG TERM GOAL #3   Title Pt will demonstrate 4+/5 gross shoulder and periscapular strength in order to complete heavy household ADLs    Baseline 06/06/22 R/L Flex 4-/5; ER 4+/5; Y lower trap 3+/4; T scap retractors 4-/4+  Time 8    Period Weeks    Status New                    Patient will benefit from skilled therapeutic intervention in order to improve the following deficits and impairments:     Visit Diagnosis: Chronic right shoulder pain     Problem List Patient Active Problem List   Diagnosis Date Noted   Stage 3a chronic kidney disease ([place]) 06/20/2022   Osteoarthritis 06/09/2021   Anemia 06/09/2021   Class 2 obesity due to excess calories with body mass index (BMI) of 38.0 to 38.9 in adult 06/09/2021   History of breast cancer 07/01/2020   Hyperlipidemia associated with type 2 diabetes mellitus ([place]) 10/02/2018   Neuropathy due to chemotherapeutic drug ([place]) 10/02/2018   Controlled type 2 diabetes mellitus with hyperglycemia ([place]) 07/05/2018   Essential hypertension 01/05/2016   Durwin Reges DPT Durwin Reges, PT 06/22/2022, 10:26 AM  [place]  [facility] [address], [place], [address] Phone: [phone]   Fax:  [phone]  Name: Chelsea Hudson MRN: [id number] Date of Birth: Oct 02, 1966

## 2022-06-29 ENCOUNTER — Encounter: Payer: Self-pay | Admitting: Physical Therapy

## 2022-06-29 ENCOUNTER — Ambulatory Visit: Payer: Medicare Other | Admitting: Physical Therapy

## 2022-06-29 DIAGNOSIS — M25511 Pain in right shoulder: Secondary | ICD-10-CM | POA: Diagnosis not present

## 2022-06-29 DIAGNOSIS — G8929 Other chronic pain: Secondary | ICD-10-CM | POA: Diagnosis not present

## 2022-06-29 NOTE — Therapy (Signed)
OUTPATIENT PHYSICAL THERAPY TREATMENT NOTE   Patient Name: Chelsea Hudson MRN: [id number] DOB:1966/07/11, 56 y.o., female Today's Date: 06/20/2022  PCP: Webb Silversmith NP REFERRING PROVIDER: Earlie Server MD  END OF SESSION:   PT End of Session - 06/20/22 0758     Visit Number 4    Number of Visits 17    Date for PT Re-Evaluation 08/04/22    Authorization - Visit Number 4    Authorization - Number of Visits 17    Progress Note Due on Visit 10    PT Start Time 0753    PT Stop Time 0831    PT Time Calculation (min) 38 min    Activity Tolerance Patient tolerated treatment well    Behavior During Therapy [facility] for tasks assessed/performed               Past Medical History:  Diagnosis Date   Arthritis    SHOULDER   Breast cancer ([place]) 02/2017   rt breast   Breast cancer ([place]) 2018   Cancer ([place]) 02/28/2017   INVASIVE MAMMARY CARCINOMA WITH MUCINOUS FEATURES.   Colonic diverticular abscess 06/21/2017   Colonoscopy 06/25/2017: No evidence of malignancy.   Diabetes mellitus without complication ([place])    Hypertension    Irregular heart beat    PT STATES IT "SKIPS A BEAT"    Obesity    Personal history of chemotherapy    Personal history of radiation therapy    Past Surgical History:  Procedure Laterality Date   ANKLE SURGERY     BREAST BIOPSY Right 02/28/2017   INVASIVE MAMMARY CARCINOMA WITH MUCINOUS FEATURES.   BREAST CYST ASPIRATION Right    NEG   COLONOSCOPY WITH PROPOFOL N/A 06/25/2017   Procedure: COLONOSCOPY WITH PROPOFOL;  Surgeon: Jonathon Bellows, MD;  Location: [facility] ENDOSCOPY;  Service: Gastroenterology;  Laterality: N/A;   MASTECTOMY, PARTIAL Right 07/30/2017   Procedure: MASTECTOMY PARTIAL;  Surgeon: Robert Bellow, MD;  Location: [facility] ORS;  Service: General;  Laterality: Right;   PORTACATH PLACEMENT Left 03/15/2017   Procedure: INSERTION PORT-A-CATH;  Surgeon: Robert Bellow, MD;  Location: [facility] ORS;  Service: General;  Laterality: Left;   RE-EXCISION OF  BREAST LUMPECTOMY Right 08/17/2017    INVASIVE CARCINOMA EXTENDS TO NEW LATERAL MARGIN. /RE-EXCISION OF BREAST LUMPECTOMY;: Byrnett, Forest Gleason, MD;  [facility] ORS; General;  Laterality: Right;   SENTINEL NODE BIOPSY Right 07/30/2017   Procedure: SENTINEL NODE BIOPSY;  Surgeon: Robert Bellow, MD;  Location: [facility] ORS;  Service: General;  Laterality: Right;   SIMPLE MASTECTOMY WITH AXILLARY SENTINEL NODE BIOPSY Right 09/28/2017   Procedure: SIMPLE MASTECTOMY;  Surgeon: Robert Bellow, MD;  Location: [facility] ORS;  Service: General;  Laterality: Right;   Patient Active Problem List   Diagnosis Date Noted   Aromatase inhibitor use 05/09/2022   Elevated serum creatinine 05/09/2022   Osteoarthritis 06/09/2021   Anemia 06/09/2021   Class 2 obesity due to excess calories with body mass index (BMI) of 36.0 to 36.9 in adult 06/09/2021   Hyperlipidemia associated with type 2 diabetes mellitus ([place]) 10/02/2018   Neuropathy due to chemotherapeutic drug ([place]) 10/02/2018   Controlled type 2 diabetes mellitus with hyperglycemia ([place]) 07/05/2018   Essential hypertension 01/05/2016    REFERRING DIAG: Chronic R shoulder pain   THERAPY DIAG:  Chronic right shoulder pain  Rationale for Evaluation and Treatment Rehabilitation  PERTINENT HISTORY: Pt is a 56 year old female familar with this clinic. Insideous onset in May of this year  started having some increased swelling and pain in her R shoulder, and was not able to lift it as high as the L. 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 Has difficulty lifting herself up into the truck (grabbing overhead handle). She has difficulty with overhead ADLS (reaching into overhead cabinet, cleaning her TV and windows. Pt completes line dancing 3x/week for exercises. Is  still wanting to complete R breast reconstruction, but per patient MD would like her to lose more weight prior. Pt is R handed. Pt denies N/V, B&B changes, unexplained weight fluctuation, saddle paresthesia, fever, night sweats, or unrelenting night pain at this time.  PRECAUTIONS: none  SUBJECTIVE: No pain on arrival. Reports compliance with HEP. Some increase in motion noted.  PAIN:  Are you having pain? No   OBJECTIVE: (objective measures completed at initial evaluation unless otherwise dated)  Ther-Ex Pulleys flex and abd x12 each  [employer] with cuing for scapular retraction with good carry over Fwd flex walks x12 with good carry over of demo Y on wall 2x8 with good carry over of demo Lat pullover 7# dowel 2 x10 with increased mobility from previous session Post capsule stretch x30sec Doorway pec stretch 30sec   Manual STM with trigger point release to latissimus dorsi G3 mob with movement in all directions  PROM all directions  Clinical Impression: PT continued therex progression for increased shoulder mobility and strengthening with success. Pt is able to comply with all cuing for proper technique of therex with excellent motivation throughout session.  Pt is continuing to demonstrate excellent increased in mobility, with near full AAROM flexion.  Pt continues to tolerate increased pressure to manual techniques. PT will continue progression as able.      PT Short Term Goals - 06/06/22 1031       PT SHORT TERM GOAL #1   Title Pt will be independent with HEP in order to decrease shoulder pain and increase strength in order to improve pain-free function at home and work.    Baseline 06/06/22 HEP given    Time 5    Period Weeks    Status New              PT Long Term Goals - 06/06/22 1032       PT LONG TERM GOAL #1   Title Patient will increase FOTO score to 59 to demonstrate predicted increase in functional mobility to complete ADLs    Baseline 06/06/22 46     Time 8    Period Weeks    Status New      PT LONG TERM GOAL #2   Title Pt will demonstrate full R shoulder AROM in order to complete overhead and self care ADLs.    Baseline 06/06/22 flex: 103d abd 94d ER Apleys C3    Time 8    Period Weeks    Status New      PT LONG TERM GOAL #3   Title Pt will demonstrate 4+/5 gross shoulder and periscapular strength in order to complete heavy household ADLs    Baseline 06/06/22 R/L Flex 4-/5; ER 4+/5; Y lower trap 3+/4; T scap retractors 4-/4+    Time 8    Period Weeks    Status New  Durwin Reges DPT Durwin Reges, PT 06/20/2022, 3:15 PM   [facility] [address] [place], [address] Phone: [phone]   Fax:  [phone]  Physical Therapy Treatment  Patient Details  Name: Chelsea Hudson MRN: [id number] Date of Birth: [birth year] No data recorded  Encounter Date: 06/22/2022   PT End of Session - 06/22/22 0858     Visit Number 5    Number of Visits 17    Date for PT Re-Evaluation 08/04/22    Authorization Time Period 3 visits 08/12/20 - 09/10/20    Authorization - Visit Number 5    Authorization - Number of Visits 17    Progress Note Due on Visit 10    PT Start Time 0750    PT Stop Time 0828    PT Time Calculation (min) 38 min    Activity Tolerance Patient tolerated treatment well    Behavior During Therapy [facility] for tasks assessed/performed             Past Medical History:  Diagnosis Date   Arthritis    SHOULDER   Breast cancer ([place]) 02/2017   rt breast   Breast cancer ([place]) 2018   Cancer ([place]) 02/28/2017   INVASIVE MAMMARY CARCINOMA WITH MUCINOUS FEATURES.   Colonic diverticular abscess 06/21/2017   Colonoscopy 06/25/2017: No evidence of malignancy.   Diabetes mellitus without complication ([place])    Hypertension    Irregular heart beat    PT STATES IT "SKIPS A BEAT"    Obesity    Personal history of chemotherapy     Personal history of radiation therapy     Past Surgical History:  Procedure Laterality Date   ANKLE SURGERY     BREAST BIOPSY Right 02/28/2017   INVASIVE MAMMARY CARCINOMA WITH MUCINOUS FEATURES.   BREAST CYST ASPIRATION Right    NEG   COLONOSCOPY WITH PROPOFOL N/A 06/25/2017   Procedure: COLONOSCOPY WITH PROPOFOL;  Surgeon: Jonathon Bellows, MD;  Location: [facility] ENDOSCOPY;  Service: Gastroenterology;  Laterality: N/A;   MASTECTOMY, PARTIAL Right 07/30/2017   Procedure: MASTECTOMY PARTIAL;  Surgeon: Robert Bellow, MD;  Location: [facility] ORS;  Service: General;  Laterality: Right;   PORTACATH PLACEMENT Left 03/15/2017   Procedure: INSERTION PORT-A-CATH;  Surgeon: Robert Bellow, MD;  Location: [facility] ORS;  Service: General;  Laterality: Left;   RE-EXCISION OF BREAST LUMPECTOMY Right 08/17/2017    INVASIVE CARCINOMA EXTENDS TO NEW LATERAL MARGIN. /RE-EXCISION OF BREAST LUMPECTOMY;: Robert Bellow, MD;  [place]S; General;  Laterality: Right;   SENTINEL NODE BIOPSY Right 07/30/2017   Procedure: SENTINEL NODE BIOPSY;  Surgeon: Robert Bellow, MD;  Location: [facility] ORS;  Service: General;  Laterality: Right;   SIMPLE MASTECTOMY WITH AXILLARY SENTINEL NODE BIOPSY Right 09/28/2017   Procedure: SIMPLE MASTECTOMY;  Surgeon: Robert Bellow, MD;  Location: [facility] ORS;  Service: General;  Laterality: Right;    There were no vitals filed for this visit.    REFERRING DIAG: Chronic R shoulder pain    THERAPY DIAG:  Chronic right shoulder pain   Rationale for Evaluation and Treatment Rehabilitation   PERTINENT HISTORY: Pt is a 56 year old female familar with this clinic. Insideous onset in May of this year started having some increased swelling and pain in her R shoulder, and was not able to lift it as high as the L. Started using a pump machine for lymphadema, and the pain has gone away but her arm  feels heavy to lift.Pt has no pain at this time, mostly restricted by movement. 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 Has difficulty lifting herself up into the truck (grabbing overhead handle). She has difficulty with overhead ADLS (reaching into overhead cabinet, cleaning her TV and windows. Pt completes line dancing 3x/week for exercises. Is still wanting to complete R breast reconstruction, but per patient MD would like her to lose more weight prior. Pt is R handed. Pt denies N/V, B&B changes, unexplained weight fluctuation, saddle paresthesia, fever, night sweats, or unrelenting night pain at this time.   PRECAUTIONS: none   SUBJECTIVE: No pain on arrival. Reports compliance with HEP. Some increase in motion noted.   PAIN:  Are you having pain? No     OBJECTIVE: (objective measures completed at initial evaluation unless otherwise dated)   Ther-Ex Pulleys flex and abd x12 each  [employer] with cuing for scapular retraction with good carry over Dowel AAROM overhead flex x12 with feet anchored, cuing to prevent excessive thoracic ext compensation with good carry over Y on wall 1# DB 2x 10with with cuing for eccentric control with good carry over Bent over reverse fly 1# x12; 2# DB x10  Post shoulder rolls x12 Post capsule stretch x30sec Doorway pec stretch 30sec    Manual STM with trigger point release to latissimus dorsi G3 mob with movement in all directions  PROM all directions   Clinical Impression: PT continued therex progression for increased shoulder mobility and strengthening with success. Pt is able to comply with all cuing for proper technique of therex with excellent motivation throughout session. Pt is continueing to demosntrate incrased mobility. With active forward flexion close to 160d, pt reports a "catch" at 90d that she is able to go further without pain following, continued evidence for impingement.  PT will continue progression as able.                              PT Short Term Goals  - 06/06/22 1031       PT SHORT TERM GOAL #1   Title Pt will be independent with HEP in order to decrease shoulder pain and increase strength in order to improve pain-free function at home and work.    Baseline 06/06/22 HEP given    Time 5    Period Weeks    Status New               PT Long Term Goals - 06/06/22 1032       PT LONG TERM GOAL #1   Title Patient will increase FOTO score to 59 to demonstrate predicted increase in functional mobility to complete ADLs    Baseline 06/06/22 46    Time 8    Period Weeks    Status New      PT LONG TERM GOAL #2   Title Pt will demonstrate full R shoulder AROM in order to complete overhead and self care ADLs.    Baseline 06/06/22 flex: 103d abd 94d ER Apleys C3    Time 8    Period Weeks    Status New      PT LONG TERM GOAL #3   Title Pt will demonstrate 4+/5 gross shoulder and periscapular strength in order to complete heavy household ADLs    Baseline 06/06/22 R/L Flex 4-/5; ER 4+/5; Y lower trap 3+/4; T scap retractors  4-/4+    Time 8    Period Weeks    Status New                    Patient will benefit from skilled therapeutic intervention in order to improve the following deficits and impairments:     Visit Diagnosis: Chronic right shoulder pain     Problem List Patient Active Problem List   Diagnosis Date Noted   Stage 3a chronic kidney disease ([place]) 06/20/2022   Osteoarthritis 06/09/2021   Anemia 06/09/2021   Class 2 obesity due to excess calories with body mass index (BMI) of 38.0 to 38.9 in adult 06/09/2021   History of breast cancer 07/01/2020   Hyperlipidemia associated with type 2 diabetes mellitus ([place]) 10/02/2018   Neuropathy due to chemotherapeutic drug ([place]) 10/02/2018   Controlled type 2 diabetes mellitus with hyperglycemia ([place]) 07/05/2018   Essential hypertension 01/05/2016   Durwin Reges DPT Durwin Reges, PT 06/22/2022, 10:26 AM  [facility] [address] [place], [address] Phone: [phone]   Fax:  [phone]  Name: Chelsea Hudson MRN: [id number] Date of Birth: 1966/11/02

## 2022-07-04 ENCOUNTER — Ambulatory Visit: Payer: Medicare Other | Admitting: Physical Therapy

## 2022-07-04 ENCOUNTER — Encounter: Payer: Self-pay | Admitting: Physical Therapy

## 2022-07-04 DIAGNOSIS — G8929 Other chronic pain: Secondary | ICD-10-CM | POA: Diagnosis not present

## 2022-07-04 DIAGNOSIS — M25511 Pain in right shoulder: Secondary | ICD-10-CM | POA: Diagnosis not present

## 2022-07-04 NOTE — Therapy (Signed)
OUTPATIENT PHYSICAL THERAPY TREATMENT NOTE   Patient Name: Chelsea Hudson MRN: [id number] DOB:1966/07/11, 56 y.o., female Today's Date: 06/20/2022  PCP: Webb Silversmith NP REFERRING PROVIDER: Earlie Server MD  END OF SESSION:   PT End of Session - 06/20/22 0758     Visit Number 4    Number of Visits 17    Date for PT Re-Evaluation 08/04/22    Authorization - Visit Number 4    Authorization - Number of Visits 17    Progress Note Due on Visit 10    PT Start Time 0753    PT Stop Time 0831    PT Time Calculation (min) 38 min    Activity Tolerance Patient tolerated treatment well    Behavior During Therapy [facility] for tasks assessed/performed               Past Medical History:  Diagnosis Date   Arthritis    SHOULDER   Breast cancer ([place]) 02/2017   rt breast   Breast cancer ([place]) 2018   Cancer ([place]) 02/28/2017   INVASIVE MAMMARY CARCINOMA WITH MUCINOUS FEATURES.   Colonic diverticular abscess 06/21/2017   Colonoscopy 06/25/2017: No evidence of malignancy.   Diabetes mellitus without complication ([place])    Hypertension    Irregular heart beat    PT STATES IT "SKIPS A BEAT"    Obesity    Personal history of chemotherapy    Personal history of radiation therapy    Past Surgical History:  Procedure Laterality Date   ANKLE SURGERY     BREAST BIOPSY Right 02/28/2017   INVASIVE MAMMARY CARCINOMA WITH MUCINOUS FEATURES.   BREAST CYST ASPIRATION Right    NEG   COLONOSCOPY WITH PROPOFOL N/A 06/25/2017   Procedure: COLONOSCOPY WITH PROPOFOL;  Surgeon: Jonathon Bellows, MD;  Location: [facility] ENDOSCOPY;  Service: Gastroenterology;  Laterality: N/A;   MASTECTOMY, PARTIAL Right 07/30/2017   Procedure: MASTECTOMY PARTIAL;  Surgeon: Robert Bellow, MD;  Location: [facility] ORS;  Service: General;  Laterality: Right;   PORTACATH PLACEMENT Left 03/15/2017   Procedure: INSERTION PORT-A-CATH;  Surgeon: Robert Bellow, MD;  Location: [facility] ORS;  Service: General;  Laterality: Left;   RE-EXCISION OF  BREAST LUMPECTOMY Right 08/17/2017    INVASIVE CARCINOMA EXTENDS TO NEW LATERAL MARGIN. /RE-EXCISION OF BREAST LUMPECTOMY;: Byrnett, Forest Gleason, MD;  [facility] ORS; General;  Laterality: Right;   SENTINEL NODE BIOPSY Right 07/30/2017   Procedure: SENTINEL NODE BIOPSY;  Surgeon: Robert Bellow, MD;  Location: [facility] ORS;  Service: General;  Laterality: Right;   SIMPLE MASTECTOMY WITH AXILLARY SENTINEL NODE BIOPSY Right 09/28/2017   Procedure: SIMPLE MASTECTOMY;  Surgeon: Robert Bellow, MD;  Location: [facility] ORS;  Service: General;  Laterality: Right;   Patient Active Problem List   Diagnosis Date Noted   Aromatase inhibitor use 05/09/2022   Elevated serum creatinine 05/09/2022   Osteoarthritis 06/09/2021   Anemia 06/09/2021   Class 2 obesity due to excess calories with body mass index (BMI) of 36.0 to 36.9 in adult 06/09/2021   Hyperlipidemia associated with type 2 diabetes mellitus ([place]) 10/02/2018   Neuropathy due to chemotherapeutic drug ([place]) 10/02/2018   Controlled type 2 diabetes mellitus with hyperglycemia ([place]) 07/05/2018   Essential hypertension 01/05/2016    REFERRING DIAG: Chronic R shoulder pain   THERAPY DIAG:  Chronic right shoulder pain  Rationale for Evaluation and Treatment Rehabilitation  PERTINENT HISTORY: Pt is a 56 year old female familar with this clinic. Insideous onset in May of this year  started having some increased swelling and pain in her R shoulder, and was not able to lift it as high as the L. 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 Has difficulty lifting herself up into the truck (grabbing overhead handle). She has difficulty with overhead ADLS (reaching into overhead cabinet, cleaning her TV and windows. Pt completes line dancing 3x/week for exercises. Is  still wanting to complete R breast reconstruction, but per patient MD would like her to lose more weight prior. Pt is R handed. Pt denies N/V, B&B changes, unexplained weight fluctuation, saddle paresthesia, fever, night sweats, or unrelenting night pain at this time.  PRECAUTIONS: none  SUBJECTIVE: No pain on arrival. Reports compliance with HEP. Some increase in motion noted.  PAIN:  Are you having pain? No   OBJECTIVE: (objective measures completed at initial evaluation unless otherwise dated)  Ther-Ex Pulleys flex and abd x12 each  [employer] with cuing for scapular retraction with good carry over Fwd flex walks x12 with good carry over of demo Y on wall 2x8 with good carry over of demo Lat pullover 7# dowel 2 x10 with increased mobility from previous session Post capsule stretch x30sec Doorway pec stretch 30sec   Manual STM with trigger point release to latissimus dorsi G3 mob with movement in all directions  PROM all directions  Clinical Impression: PT continued therex progression for increased shoulder mobility and strengthening with success. Pt is able to comply with all cuing for proper technique of therex with excellent motivation throughout session.  Pt is continuing to demonstrate excellent increased in mobility, with near full AAROM flexion.  Pt continues to tolerate increased pressure to manual techniques. PT will continue progression as able.      PT Short Term Goals - 06/06/22 1031       PT SHORT TERM GOAL #1   Title Pt will be independent with HEP in order to decrease shoulder pain and increase strength in order to improve pain-free function at home and work.    Baseline 06/06/22 HEP given    Time 5    Period Weeks    Status New              PT Long Term Goals - 06/06/22 1032       PT LONG TERM GOAL #1   Title Patient will increase FOTO score to 59 to demonstrate predicted increase in functional mobility to complete ADLs    Baseline 06/06/22 46     Time 8    Period Weeks    Status New      PT LONG TERM GOAL #2   Title Pt will demonstrate full R shoulder AROM in order to complete overhead and self care ADLs.    Baseline 06/06/22 flex: 103d abd 94d ER Apleys C3    Time 8    Period Weeks    Status New      PT LONG TERM GOAL #3   Title Pt will demonstrate 4+/5 gross shoulder and periscapular strength in order to complete heavy household ADLs    Baseline 06/06/22 R/L Flex 4-/5; ER 4+/5; Y lower trap 3+/4; T scap retractors 4-/4+    Time 8    Period Weeks    Status New  Durwin Reges DPT Durwin Reges, PT 06/20/2022, 3:15 PM   [facility] [address], [place], [address] Phone: [phone]   Fax:  [phone]  Physical Therapy Treatment  Patient Details  Name: Chelsea Hudson MRN: [id number] Date of Birth: 1966/04/07 No data recorded  Encounter Date: 06/22/2022   PT End of Session - 06/22/22 0858     Visit Number 5    Number of Visits 17    Date for PT Re-Evaluation 08/04/22    Authorization Time Period 3 visits 08/12/20 - 09/10/20    Authorization - Visit Number 5    Authorization - Number of Visits 17    Progress Note Due on Visit 10    PT Start Time 0750    PT Stop Time 0828    PT Time Calculation (min) 38 min    Activity Tolerance Patient tolerated treatment well    Behavior During Therapy [facility] for tasks assessed/performed             Past Medical History:  Diagnosis Date   Arthritis    SHOULDER   Breast cancer ([place]) 02/2017   rt breast   Breast cancer ([place]) 2018   Cancer ([place]) 02/28/2017   INVASIVE MAMMARY CARCINOMA WITH MUCINOUS FEATURES.   Colonic diverticular abscess 06/21/2017   Colonoscopy 06/25/2017: No evidence of malignancy.   Diabetes mellitus without complication ([place])    Hypertension    Irregular heart beat    PT STATES IT "SKIPS A BEAT"    Obesity    Personal history of chemotherapy     Personal history of radiation therapy     Past Surgical History:  Procedure Laterality Date   ANKLE SURGERY     BREAST BIOPSY Right 02/28/2017   INVASIVE MAMMARY CARCINOMA WITH MUCINOUS FEATURES.   BREAST CYST ASPIRATION Right    NEG   COLONOSCOPY WITH PROPOFOL N/A 06/25/2017   Procedure: COLONOSCOPY WITH PROPOFOL;  Surgeon: Jonathon Bellows, MD;  Location: [facility] ENDOSCOPY;  Service: Gastroenterology;  Laterality: N/A;   MASTECTOMY, PARTIAL Right 07/30/2017   Procedure: MASTECTOMY PARTIAL;  Surgeon: Robert Bellow, MD;  Location: [facility] ORS;  Service: General;  Laterality: Right;   PORTACATH PLACEMENT Left 03/15/2017   Procedure: INSERTION PORT-A-CATH;  Surgeon: Robert Bellow, MD;  Location: [facility] ORS;  Service: General;  Laterality: Left;   RE-EXCISION OF BREAST LUMPECTOMY Right 08/17/2017    INVASIVE CARCINOMA EXTENDS TO NEW LATERAL MARGIN. /RE-EXCISION OF BREAST LUMPECTOMY;: Robert Bellow, MD;  [place] ORS; General;  Laterality: Right;   SENTINEL NODE BIOPSY Right 07/30/2017   Procedure: SENTINEL NODE BIOPSY;  Surgeon: Robert Bellow, MD;  Location: [facility] ORS;  Service: General;  Laterality: Right;   SIMPLE MASTECTOMY WITH AXILLARY SENTINEL NODE BIOPSY Right 09/28/2017   Procedure: SIMPLE MASTECTOMY;  Surgeon: Robert Bellow, MD;  Location: [facility] ORS;  Service: General;  Laterality: Right;    There were no vitals filed for this visit.    REFERRING DIAG: Chronic R shoulder pain    THERAPY DIAG:  Chronic right shoulder pain   Rationale for Evaluation and Treatment Rehabilitation   PERTINENT HISTORY: Pt is a 56 year old female familar with this clinic. Insideous onset in May of this year started having some increased swelling and pain in her R shoulder, and was not able to lift it as high as the L. Started using a pump machine for lymphadema, and the pain has gone away but her arm  feels heavy to lift.Pt has no pain at this time, mostly restricted by movement. 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 Has difficulty lifting herself up into the truck (grabbing overhead handle). She has difficulty with overhead ADLS (reaching into overhead cabinet, cleaning her TV and windows. Pt completes line dancing 3x/week for exercises. Is still wanting to complete R breast reconstruction, but per patient MD would like her to lose more weight prior. Pt is R handed. Pt denies N/V, B&B changes, unexplained weight fluctuation, saddle paresthesia, fever, night sweats, or unrelenting night pain at this time.   PRECAUTIONS: none   SUBJECTIVE: No pain on arrival. Reports compliance with HEP. Some increase in motion noted.   PAIN:  Are you having pain? No     OBJECTIVE: (objective measures completed at initial evaluation unless otherwise dated)   Ther-Ex Pulleys flex and abd x12 each  Pullovers with 7# AW on dowel x12 with cuing for scapulohumeral rhythm; scap retraction first for carry over [employer] with cuing for scapular retraction with good carry over Y on wall 1# DB 2x 10 with with cuing for eccentric control with good carry over Wt'd ball overhead taps to wall 2x 15sec with min cuing to prevent lateral trunk flex Seated scaption 2x 6 attempted overhead press unable; cuing for scapulohumeral rhythm with TC of chair behind with good carry over Post shoulder rolls x12 Post capsule stretch x30sec Doorway pec stretch 30sec    Manual STM with trigger point release to latissimus dorsi G3 mob with movement in all directions  PROM all directions   Clinical Impression: PT continued therex progression for increased shoulder mobility and strengthening with success. Pt is able to comply with all cuing for proper technique of therex with excellent motivation throughout session. Pt is continueing to demosntrate incrased mobility. With active forward flexion close to 160d, pt reports a "catch" at 90d that she is able to go further without pain  following, continued evidence for impingement.  PT will continue progression as able.                              PT Short Term Goals - 06/06/22 1031       PT SHORT TERM GOAL #1   Title Pt will be independent with HEP in order to decrease shoulder pain and increase strength in order to improve pain-free function at home and work.    Baseline 06/06/22 HEP given    Time 5    Period Weeks    Status New               PT Long Term Goals - 06/06/22 1032       PT LONG TERM GOAL #1   Title Patient will increase FOTO score to 59 to demonstrate predicted increase in functional mobility to complete ADLs    Baseline 06/06/22 46    Time 8    Period Weeks    Status New      PT LONG TERM GOAL #2   Title Pt will demonstrate full R shoulder AROM in order to complete overhead and self care ADLs.    Baseline 06/06/22 flex: 103d abd 94d ER Apleys C3    Time 8    Period Weeks    Status New      PT LONG TERM GOAL #3   Title Pt will demonstrate 4+/5 gross shoulder  and periscapular strength in order to complete heavy household ADLs    Baseline 06/06/22 R/L Flex 4-/5; ER 4+/5; Y lower trap 3+/4; T scap retractors 4-/4+    Time 8    Period Weeks    Status New                    Patient will benefit from skilled therapeutic intervention in order to improve the following deficits and impairments:     Visit Diagnosis: Chronic right shoulder pain     Problem List Patient Active Problem List   Diagnosis Date Noted   Stage 3a chronic kidney disease ([place]) 06/20/2022   Osteoarthritis 06/09/2021   Anemia 06/09/2021   Class 2 obesity due to excess calories with body mass index (BMI) of 38.0 to 38.9 in adult 06/09/2021   History of breast cancer 07/01/2020   Hyperlipidemia associated with type 2 diabetes mellitus ([place]) 10/02/2018   Neuropathy due to chemotherapeutic drug ([place]) 10/02/2018   Controlled type 2 diabetes mellitus with hyperglycemia ([place])  07/05/2018   Essential hypertension 01/05/2016   Durwin Reges DPT Durwin Reges, PT 06/22/2022, 10:26 AM  [facility] [facility] [address], [place], [address] Phone: [phone]   Fax:  [phone]  Name: Chelsea Hudson MRN: [id number] Date of Birth: 1965-12-13

## 2022-07-06 ENCOUNTER — Ambulatory Visit: Payer: Medicare Other | Admitting: Physical Therapy

## 2022-07-06 ENCOUNTER — Encounter: Payer: Self-pay | Admitting: Physical Therapy

## 2022-07-06 DIAGNOSIS — M25511 Pain in right shoulder: Secondary | ICD-10-CM | POA: Diagnosis not present

## 2022-07-06 DIAGNOSIS — G8929 Other chronic pain: Secondary | ICD-10-CM | POA: Diagnosis not present

## 2022-07-06 NOTE — Therapy (Signed)
OUTPATIENT PHYSICAL THERAPY TREATMENT NOTE   Patient Name: Chelsea Hudson MRN: [id number] DOB:1966/07/11, 56 y.o., female Today's Date: 06/20/2022  PCP: Webb Silversmith NP REFERRING PROVIDER: Earlie Server MD  END OF SESSION:   PT End of Session - 06/20/22 0758     Visit Number 4    Number of Visits 17    Date for PT Re-Evaluation 08/04/22    Authorization - Visit Number 4    Authorization - Number of Visits 17    Progress Note Due on Visit 10    PT Start Time 0753    PT Stop Time 0831    PT Time Calculation (min) 38 min    Activity Tolerance Patient tolerated treatment well    Behavior During Therapy [facility] for tasks assessed/performed               Past Medical History:  Diagnosis Date   Arthritis    SHOULDER   Breast cancer ([place]) 02/2017   rt breast   Breast cancer ([place]) 2018   Cancer ([place]) 02/28/2017   INVASIVE MAMMARY CARCINOMA WITH MUCINOUS FEATURES.   Colonic diverticular abscess 06/21/2017   Colonoscopy 06/25/2017: No evidence of malignancy.   Diabetes mellitus without complication ([place])    Hypertension    Irregular heart beat    PT STATES IT "SKIPS A BEAT"    Obesity    Personal history of chemotherapy    Personal history of radiation therapy    Past Surgical History:  Procedure Laterality Date   ANKLE SURGERY     BREAST BIOPSY Right 02/28/2017   INVASIVE MAMMARY CARCINOMA WITH MUCINOUS FEATURES.   BREAST CYST ASPIRATION Right    NEG   COLONOSCOPY WITH PROPOFOL N/A 06/25/2017   Procedure: COLONOSCOPY WITH PROPOFOL;  Surgeon: Jonathon Bellows, MD;  Location: [facility] ENDOSCOPY;  Service: Gastroenterology;  Laterality: N/A;   MASTECTOMY, PARTIAL Right 07/30/2017   Procedure: MASTECTOMY PARTIAL;  Surgeon: Robert Bellow, MD;  Location: [facility] ORS;  Service: General;  Laterality: Right;   PORTACATH PLACEMENT Left 03/15/2017   Procedure: INSERTION PORT-A-CATH;  Surgeon: Robert Bellow, MD;  Location: [facility] ORS;  Service: General;  Laterality: Left;   RE-EXCISION OF  BREAST LUMPECTOMY Right 08/17/2017    INVASIVE CARCINOMA EXTENDS TO NEW LATERAL MARGIN. /RE-EXCISION OF BREAST LUMPECTOMY;: Byrnett, Forest Gleason, MD;  [facility] ORS; General;  Laterality: Right;   SENTINEL NODE BIOPSY Right 07/30/2017   Procedure: SENTINEL NODE BIOPSY;  Surgeon: Robert Bellow, MD;  Location: [facility] ORS;  Service: General;  Laterality: Right;   SIMPLE MASTECTOMY WITH AXILLARY SENTINEL NODE BIOPSY Right 09/28/2017   Procedure: SIMPLE MASTECTOMY;  Surgeon: Robert Bellow, MD;  Location: [facility] ORS;  Service: General;  Laterality: Right;   Patient Active Problem List   Diagnosis Date Noted   Aromatase inhibitor use 05/09/2022   Elevated serum creatinine 05/09/2022   Osteoarthritis 06/09/2021   Anemia 06/09/2021   Class 2 obesity due to excess calories with body mass index (BMI) of 36.0 to 36.9 in adult 06/09/2021   Hyperlipidemia associated with type 2 diabetes mellitus ([place]) 10/02/2018   Neuropathy due to chemotherapeutic drug ([place]) 10/02/2018   Controlled type 2 diabetes mellitus with hyperglycemia ([place]) 07/05/2018   Essential hypertension 01/05/2016    REFERRING DIAG: Chronic R shoulder pain   THERAPY DIAG:  Chronic right shoulder pain  Rationale for Evaluation and Treatment Rehabilitation  PERTINENT HISTORY: Pt is a 56 year old female familar with this clinic. Insideous onset in May of this year  started having some increased swelling and pain in her R shoulder, and was not able to lift it as high as the L. 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 Has difficulty lifting herself up into the truck (grabbing overhead handle). She has difficulty with overhead ADLS (reaching into overhead cabinet, cleaning her TV and windows. Pt completes line dancing 3x/week for exercises. Is  still wanting to complete R breast reconstruction, but per patient MD would like her to lose more weight prior. Pt is R handed. Pt denies N/V, B&B changes, unexplained weight fluctuation, saddle paresthesia, fever, night sweats, or unrelenting night pain at this time.  PRECAUTIONS: none  SUBJECTIVE: No pain on arrival. Reports compliance with HEP. Some increase in motion noted.  PAIN:  Are you having pain? No   OBJECTIVE: (objective measures completed at initial evaluation unless otherwise dated)  Ther-Ex Pulleys flex and abd x12 each  [employer] with cuing for scapular retraction with good carry over Fwd flex walks x12 with good carry over of demo Y on wall 2x8 with good carry over of demo Lat pullover 7# dowel 2 x10 with increased mobility from previous session Post capsule stretch x30sec Doorway pec stretch 30sec   Manual STM with trigger point release to latissimus dorsi G3 mob with movement in all directions  PROM all directions  Clinical Impression: PT continued therex progression for increased shoulder mobility and strengthening with success. Pt is able to comply with all cuing for proper technique of therex with excellent motivation throughout session.  Pt is continuing to demonstrate excellent increased in mobility, with near full AAROM flexion.  Pt continues to tolerate increased pressure to manual techniques. PT will continue progression as able.      PT Short Term Goals - 06/06/22 1031       PT SHORT TERM GOAL #1   Title Pt will be independent with HEP in order to decrease shoulder pain and increase strength in order to improve pain-free function at home and work.    Baseline 06/06/22 HEP given    Time 5    Period Weeks    Status New              PT Long Term Goals - 06/06/22 1032       PT LONG TERM GOAL #1   Title Patient will increase FOTO score to 59 to demonstrate predicted increase in functional mobility to complete ADLs    Baseline 06/06/22 46     Time 8    Period Weeks    Status New      PT LONG TERM GOAL #2   Title Pt will demonstrate full R shoulder AROM in order to complete overhead and self care ADLs.    Baseline 06/06/22 flex: 103d abd 94d ER Apleys C3    Time 8    Period Weeks    Status New      PT LONG TERM GOAL #3   Title Pt will demonstrate 4+/5 gross shoulder and periscapular strength in order to complete heavy household ADLs    Baseline 06/06/22 R/L Flex 4-/5; ER 4+/5; Y lower trap 3+/4; T scap retractors 4-/4+    Time 8    Period Weeks    Status New  Durwin Reges DPT Durwin Reges, PT 06/20/2022, 3:15 PM   [facility] [address] [place], [address] Phone: [phone]   Fax:  [phone]  Physical Therapy Treatment  Patient Details  Name: Chelsea Hudson MRN: [id number] Date of Birth: 1966/02/08 No data recorded  Encounter Date: 06/22/2022   PT End of Session - 06/22/22 0858     Visit Number 5    Number of Visits 17    Date for PT Re-Evaluation 08/04/22    Authorization Time Period 3 visits 08/12/20 - 09/10/20    Authorization - Visit Number 5    Authorization - Number of Visits 17    Progress Note Due on Visit 10    PT Start Time 0750    PT Stop Time 0828    PT Time Calculation (min) 38 min    Activity Tolerance Patient tolerated treatment well    Behavior During Therapy [facility] for tasks assessed/performed             Past Medical History:  Diagnosis Date   Arthritis    SHOULDER   Breast cancer ([place]) 02/2017   rt breast   Breast cancer ([place]) 2018   Cancer ([place]) 02/28/2017   INVASIVE MAMMARY CARCINOMA WITH MUCINOUS FEATURES.   Colonic diverticular abscess 06/21/2017   Colonoscopy 06/25/2017: No evidence of malignancy.   Diabetes mellitus without complication ([place])    Hypertension    Irregular heart beat    PT STATES IT "SKIPS A BEAT"    Obesity    Personal history of chemotherapy     Personal history of radiation therapy     Past Surgical History:  Procedure Laterality Date   ANKLE SURGERY     BREAST BIOPSY Right 02/28/2017   INVASIVE MAMMARY CARCINOMA WITH MUCINOUS FEATURES.   BREAST CYST ASPIRATION Right    NEG   COLONOSCOPY WITH PROPOFOL N/A 06/25/2017   Procedure: COLONOSCOPY WITH PROPOFOL;  Surgeon: Jonathon Bellows, MD;  Location: [facility] ENDOSCOPY;  Service: Gastroenterology;  Laterality: N/A;   MASTECTOMY, PARTIAL Right 07/30/2017   Procedure: MASTECTOMY PARTIAL;  Surgeon: Robert Bellow, MD;  Location: [facility] ORS;  Service: General;  Laterality: Right;   PORTACATH PLACEMENT Left 03/15/2017   Procedure: INSERTION PORT-A-CATH;  Surgeon: Robert Bellow, MD;  Location: [facility] ORS;  Service: General;  Laterality: Left;   RE-EXCISION OF BREAST LUMPECTOMY Right 08/17/2017    INVASIVE CARCINOMA EXTENDS TO NEW LATERAL MARGIN. /RE-EXCISION OF BREAST LUMPECTOMY;: Robert Bellow, MD;  [facility]; General;  Laterality: Right;   SENTINEL NODE BIOPSY Right 07/30/2017   Procedure: SENTINEL NODE BIOPSY;  Surgeon: Robert Bellow, MD;  Location: [facility] ORS;  Service: General;  Laterality: Right;   SIMPLE MASTECTOMY WITH AXILLARY SENTINEL NODE BIOPSY Right 09/28/2017   Procedure: SIMPLE MASTECTOMY;  Surgeon: Robert Bellow, MD;  Location: [facility]S;  Service: General;  Laterality: Right;    There were no vitals filed for this visit.    REFERRING DIAG: Chronic R shoulder pain    THERAPY DIAG:  Chronic right shoulder pain   Rationale for Evaluation and Treatment Rehabilitation   PERTINENT HISTORY: Pt is a 56 year old female familar with this clinic. Insideous onset in May of this year started having some increased swelling and pain in her R shoulder, and was not able to lift it as high as the L. Started using a pump machine for lymphadema, and the pain has gone away but her arm  feels heavy to lift.Pt has no pain at this time, mostly restricted by movement. 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 Has difficulty lifting herself up into the truck (grabbing overhead handle). She has difficulty with overhead ADLS (reaching into overhead cabinet, cleaning her TV and windows. Pt completes line dancing 3x/week for exercises. Is still wanting to complete R breast reconstruction, but per patient MD would like her to lose more weight prior. Pt is R handed. Pt denies N/V, B&B changes, unexplained weight fluctuation, saddle paresthesia, fever, night sweats, or unrelenting night pain at this time.   PRECAUTIONS: none   SUBJECTIVE: No pain on arrival. Reports compliance with HEP. Some increase in motion noted.   PAIN:  Are you having pain? No     OBJECTIVE: (objective measures completed at initial evaluation unless otherwise dated)   Ther-Ex Pulleys flex and abd x12 each  [employer] with cuing for scapular retraction with good carry over Y on wall 1# DB 2x 10 with with cuing for eccentric control with good carry over Standing scaption 2x 8 with good carry over of scapulohumeral rhythm with wall behind for TC Small body blade 2x 15sec in 90d R shoulder flex with back against wall for TC of scapular retraction Post shoulder rolls x12 Post capsule stretch x30sec Doorway pec stretch 30sec    Manual STM with trigger point release to latissimus dorsi G3 mob with movement in all directions  PROM all directions   Clinical Impression: PT continued therex progression for increased shoulder mobility and strengthening with success. Pt is able to comply with all cuing for proper technique of therex with excellent motivation throughout session. PT provided education on anterior impingement and posture with patient with patient able to verbalize and demonstrate understanding of neutral posture. PT will continue progression as able.                              PT Short Term Goals - 06/06/22 1031       PT  SHORT TERM GOAL #1   Title Pt will be independent with HEP in order to decrease shoulder pain and increase strength in order to improve pain-free function at home and work.    Baseline 06/06/22 HEP given    Time 5    Period Weeks    Status New               PT Long Term Goals - 06/06/22 1032       PT LONG TERM GOAL #1   Title Patient will increase FOTO score to 59 to demonstrate predicted increase in functional mobility to complete ADLs    Baseline 06/06/22 46    Time 8    Period Weeks    Status New      PT LONG TERM GOAL #2   Title Pt will demonstrate full R shoulder AROM in order to complete overhead and self care ADLs.    Baseline 06/06/22 flex: 103d abd 94d ER Apleys C3    Time 8    Period Weeks    Status New      PT LONG TERM GOAL #3   Title Pt will demonstrate 4+/5 gross shoulder and periscapular strength in order to complete heavy household ADLs    Baseline 06/06/22 R/L Flex 4-/5; ER 4+/5; Y lower trap 3+/4; T scap retractors 4-/4+    Time 8  Period Weeks    Status New                    Patient will benefit from skilled therapeutic intervention in order to improve the following deficits and impairments:     Visit Diagnosis: Chronic right shoulder pain     Problem List Patient Active Problem List   Diagnosis Date Noted   Stage 3a chronic kidney disease ([place]) 06/20/2022   Osteoarthritis 06/09/2021   Anemia 06/09/2021   Class 2 obesity due to excess calories with body mass index (BMI) of 38.0 to 38.9 in adult 06/09/2021   History of breast cancer 07/01/2020   Hyperlipidemia associated with type 2 diabetes mellitus ([place]) 10/02/2018   Neuropathy due to chemotherapeutic drug ([place]) 10/02/2018   Controlled type 2 diabetes mellitus with hyperglycemia ([place]) 07/05/2018   Essential hypertension 01/05/2016   Durwin Reges DPT Durwin Reges, PT 06/22/2022, 10:26 AM  [facility] [address], [place], [address] Phone: [phone]   Fax:  [phone]  Name: Chelsea Hudson MRN: [id number] Date of Birth: 01/22/1966

## 2022-07-11 ENCOUNTER — Encounter: Payer: Self-pay | Admitting: Physical Therapy

## 2022-07-11 ENCOUNTER — Ambulatory Visit: Payer: Medicare Other | Admitting: Physical Therapy

## 2022-07-11 DIAGNOSIS — G8929 Other chronic pain: Secondary | ICD-10-CM | POA: Diagnosis not present

## 2022-07-11 DIAGNOSIS — M25511 Pain in right shoulder: Secondary | ICD-10-CM | POA: Diagnosis not present

## 2022-07-11 NOTE — Therapy (Signed)
OUTPATIENT PHYSICAL THERAPY TREATMENT NOTE/Progress Note  Reporting Period 06/06/22 - 07/11/22   Patient Name: Chelsea Hudson MRN: [id number] DOB:12-30-1965, 56 y.o., female Today's Date: 06/20/2022  PCP: Webb Silversmith NP REFERRING PROVIDER: Earlie Server MD  END OF SESSION:   PT End of Session - 06/20/22 0758     Visit Number 4    Number of Visits 17    Date for PT Re-Evaluation 08/04/22    Authorization - Visit Number 4    Authorization - Number of Visits 17    Progress Note Due on Visit 10    PT Start Time 0753    PT Stop Time 0831    PT Time Calculation (min) 38 min    Activity Tolerance Patient tolerated treatment well    Behavior During Therapy [facility] for tasks assessed/performed               Past Medical History:  Diagnosis Date   Arthritis    SHOULDER   Breast cancer ([place]) 02/2017   rt breast   Breast cancer ([place]) 2018   Cancer ([place]) 02/28/2017   INVASIVE MAMMARY CARCINOMA WITH MUCINOUS FEATURES.   Colonic diverticular abscess 06/21/2017   Colonoscopy 06/25/2017: No evidence of malignancy.   Diabetes mellitus without complication ([place])    Hypertension    Irregular heart beat    PT STATES IT "SKIPS A BEAT"    Obesity    Personal history of chemotherapy    Personal history of radiation therapy    Past Surgical History:  Procedure Laterality Date   ANKLE SURGERY     BREAST BIOPSY Right 02/28/2017   INVASIVE MAMMARY CARCINOMA WITH MUCINOUS FEATURES.   BREAST CYST ASPIRATION Right    NEG   COLONOSCOPY WITH PROPOFOL N/A 06/25/2017   Procedure: COLONOSCOPY WITH PROPOFOL;  Surgeon: Jonathon Bellows, MD;  Location: [facility] ENDOSCOPY;  Service: Gastroenterology;  Laterality: N/A;   MASTECTOMY, PARTIAL Right 07/30/2017   Procedure: MASTECTOMY PARTIAL;  Surgeon: Robert Bellow, MD;  Location: [facility] ORS;  Service: General;  Laterality: Right;   PORTACATH PLACEMENT Left 03/15/2017   Procedure: INSERTION PORT-A-CATH;  Surgeon: Robert Bellow, MD;  Location: [facility] ORS;   Service: General;  Laterality: Left;   RE-EXCISION OF BREAST LUMPECTOMY Right 08/17/2017    INVASIVE CARCINOMA EXTENDS TO NEW LATERAL MARGIN. /RE-EXCISION OF BREAST LUMPECTOMY;: Byrnett, Forest Gleason, MD;  [facility]; General;  Laterality: Right;   SENTINEL NODE BIOPSY Right 07/30/2017   Procedure: SENTINEL NODE BIOPSY;  Surgeon: Robert Bellow, MD;  Location: [facility] ORS;  Service: General;  Laterality: Right;   SIMPLE MASTECTOMY WITH AXILLARY SENTINEL NODE BIOPSY Right 09/28/2017   Procedure: SIMPLE MASTECTOMY;  Surgeon: Robert Bellow, MD;  Location: [facility] ORS;  Service: General;  Laterality: Right;   Patient Active Problem List   Diagnosis Date Noted   Aromatase inhibitor use 05/09/2022   Elevated serum creatinine 05/09/2022   Osteoarthritis 06/09/2021   Anemia 06/09/2021   Class 2 obesity due to excess calories with body mass index (BMI) of 36.0 to 36.9 in adult 06/09/2021   Hyperlipidemia associated with type 2 diabetes mellitus ([place]) 10/02/2018   Neuropathy due to chemotherapeutic drug ([place]) 10/02/2018   Controlled type 2 diabetes mellitus with hyperglycemia ([place]) 07/05/2018   Essential hypertension 01/05/2016    REFERRING DIAG: Chronic R shoulder pain   THERAPY DIAG:  Chronic right shoulder pain  Rationale for Evaluation and Treatment Rehabilitation  PERTINENT HISTORY: Pt is a 56 year old female familar with this clinic.  Insideous onset in May of this year started having some increased swelling and pain in her R shoulder, and was not able to lift it as high as the L. 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 Has difficulty lifting herself up into the truck (grabbing overhead handle). She has difficulty with overhead ADLS (reaching into overhead cabinet, cleaning her TV and  windows. Pt completes line dancing 3x/week for exercises. Is still wanting to complete R breast reconstruction, but per patient MD would like her to lose more weight prior. Pt is R handed. Pt denies N/V, B&B changes, unexplained weight fluctuation, saddle paresthesia, fever, night sweats, or unrelenting night pain at this time.  PRECAUTIONS: none  SUBJECTIVE: No pain on arrival. Reports compliance with HEP. Some increase in motion noted.  PAIN:  Are you having pain? No   OBJECTIVE: (objective measures completed at initial evaluation unless otherwise dated)  Ther-Ex Pulleys flex and abd x12 each  [employer] pullovers dowel with 7# AW attached 2x 10 with cuing for scapulohumeral rhythm with good carry over Same set up as above skull crusher 2x 10 with good carry over of initial cuing and demo  Y on wall 2x8 1# DB with min cuing to prevent shoulder hiking Post capsule stretch x30sec Doorway pec stretch 30sec   Manual STM with trigger point release to latissimus dorsi G3 mob with movement in all directions  PROM all directions  Clinical Impression: PT reassessed patient goals this session where patient is making great progress toward all goals. Patient is able to demonstrate increased shoulder and periscapular strength, increased shoulder mobility, and decreased pain. Pt still has not met goals set in these areas to allow for ind in heavy household ADLs, but is making clinically significant progression. Patient is able to comply with all cuing for proper technique of therex with good motivation throughout session.  PT will continue progression as able.      PT Short Term Goals - 06/06/22 1031       PT SHORT TERM GOAL #1   Title Pt will be independent with HEP in order to decrease shoulder pain and increase strength in order to improve pain-free function at home and work.    Baseline 06/06/22 HEP given    Time 5    Period Weeks    Status New              PT Long Term Goals -  06/06/22 1032       PT LONG TERM GOAL #1   Title Patient will increase FOTO score to 59 to demonstrate predicted increase in functional mobility to complete ADLs    Baseline 06/06/22 46    Time 8    Period Weeks    Status New      PT LONG TERM GOAL #2   Title Pt will demonstrate full R shoulder AROM in order to complete overhead and self care ADLs.    Baseline 06/06/22 flex: 103d abd 94d ER Apleys C3    Time 8    Period Weeks    Status New      PT LONG TERM GOAL #3   Title Pt will demonstrate 4+/5 gross shoulder and periscapular strength in order to complete heavy household ADLs    Baseline 06/06/22 R/L  Flex 4-/5; ER 4+/5; Y lower trap 3+/4; T scap retractors 4-/4+    Time 8    Period Weeks    Status New               Zaidy Absher DPT Durwin Reges, PT 07/11/2022, 8:15 AM

## 2022-07-13 ENCOUNTER — Encounter: Payer: Medicaid Other | Admitting: Physical Therapy

## 2022-07-14 ENCOUNTER — Ambulatory Visit: Payer: Medicare Other | Admitting: Physical Therapy

## 2022-07-17 ENCOUNTER — Other Ambulatory Visit: Payer: Self-pay | Admitting: Internal Medicine

## 2022-07-17 DIAGNOSIS — E1165 Type 2 diabetes mellitus with hyperglycemia: Secondary | ICD-10-CM

## 2022-07-18 ENCOUNTER — Encounter: Payer: Self-pay | Admitting: Physical Therapy

## 2022-07-18 ENCOUNTER — Ambulatory Visit: Payer: Medicare Other | Admitting: Physical Therapy

## 2022-07-18 ENCOUNTER — Encounter: Payer: Medicaid Other | Admitting: Physical Therapy

## 2022-07-18 DIAGNOSIS — G8929 Other chronic pain: Secondary | ICD-10-CM | POA: Diagnosis not present

## 2022-07-18 DIAGNOSIS — M25511 Pain in right shoulder: Secondary | ICD-10-CM | POA: Diagnosis not present

## 2022-07-18 NOTE — Telephone Encounter (Signed)
Requested Prescriptions  Pending Prescriptions Disp Refills  . lisinopril (ZESTRIL) 5 MG tablet [Pharmacy Med Name: Lisinopril 5 MG Oral Tablet] 100 tablet 2    Sig: TAKE 1 TABLET BY MOUTH DAILY     Cardiovascular:  ACE Inhibitors Failed - 07/17/2022 10:08 PM      Failed - Cr in normal range and within 180 days    Creat  Date Value Ref Range Status  06/20/2022 1.19 (H) 0.50 - 1.03 mg/dL Final   Creatinine, Urine  Date Value Ref Range Status  12/20/2021 74 20 - 275 mg/dL Final         Passed - K in normal range and within 180 days    Potassium  Date Value Ref Range Status  06/20/2022 4.1 3.5 - 5.3 mmol/L Final         Passed - Patient is not pregnant      Passed - Last BP in normal range    BP Readings from Last 1 Encounters:  06/20/22 102/64         Passed - Valid encounter within last 6 months    Recent Outpatient Visits          4 weeks ago Controlled type 2 diabetes mellitus with hyperglycemia, without long-term current use of insulin ([place])   [facility] [place], Coralie Keens, NP   2 months ago Acute pain of right shoulder   [place], Devonne Doughty, DO   7 months ago Encounter for general adult medical examination with abnormal findings   [facility] [place], Coralie Keens, NP   10 months ago Controlled type 2 diabetes mellitus with hyperglycemia, without long-term current use of insulin ([place])   [facility] [place], Coralie Keens, NP   1 year ago Controlled type 2 diabetes mellitus with hyperglycemia, without long-term current use of insulin ([place])   [facility] [place], Coralie Keens, NP      Future Appointments            In 5 months Baity, Coralie Keens, NP [facility] [place]           . metFORMIN (GLUCOPHAGE) 500 MG tablet [Pharmacy Med Name: metFORMIN HCl 500 MG Oral Tablet] 300 tablet 2    Sig: TAKE 2 TABLETS BY MOUTH ONCE  DAILY WITH BREAKFAST AND 1  TABLET WITH SUPPER      Endocrinology:  Diabetes - Biguanides Failed - 07/17/2022 10:08 PM      Failed - Cr in normal range and within 360 days    Creat  Date Value Ref Range Status  06/20/2022 1.19 (H) 0.50 - 1.03 mg/dL Final   Creatinine, Urine  Date Value Ref Range Status  12/20/2021 74 20 - 275 mg/dL Final         Failed - eGFR in normal range and within 360 days    GFR, Est African American  Date Value Ref Range Status  03/22/2020 89 > OR = 60 mL/min/1.62m Final   GFR, Est Non African American  Date Value Ref Range Status  03/22/2020 77 > OR = 60 mL/min/1.79mFinal   GFR, Estimated  Date Value Ref Range Status  05/09/2022 52 (L) >60 mL/min Final    Comment:    (NOTE) Calculated using the CKD-EPI Creatinine Equation (2021)    eGFR  Date Value Ref Range Status  06/20/2022 54 (L) > OR = 60 mL/min/1.7343minal  Passed - HBA1C is between 0 and 7.9 and within 180 days    Hemoglobin A1C  Date Value Ref Range Status  06/20/2022 6.8 (A) 4.0 - 5.6 % Final   Hgb A1c MFr Bld  Date Value Ref Range Status  12/20/2021 6.4 (H) <5.7 % of total Hgb Final    Comment:    For someone without known diabetes, a hemoglobin  A1c value between 5.7% and 6.4% is consistent with prediabetes and should be confirmed with a  follow-up test. . For someone with known diabetes, a value <7% indicates that their diabetes is well controlled. A1c targets should be individualized based on duration of diabetes, age, comorbid conditions, and other considerations. . This assay result is consistent with an increased risk of diabetes. . Currently, no consensus exists regarding use of hemoglobin A1c for diagnosis of diabetes for children. .          Passed - B12 Level in normal range and within 720 days    Vitamin B-12  Date Value Ref Range Status  05/03/2021 237 180 - 914 pg/mL Final    Comment:    (NOTE) This assay is not validated for testing neonatal or myeloproliferative syndrome specimens for  Vitamin B12 levels. Performed at [facility], [place] [address], [place], [place] [address]          Passed - Valid encounter within last 6 months    Recent Outpatient Visits          4 weeks ago Controlled type 2 diabetes mellitus with hyperglycemia, without long-term current use of insulin ([place])   [facility] [place], Coralie Keens, NP   2 months ago Acute pain of right shoulder   [place], Devonne Doughty, DO   7 months ago Encounter for general adult medical examination with abnormal findings   [facility], Coralie Keens, NP   10 months ago Controlled type 2 diabetes mellitus with hyperglycemia, without long-term current use of insulin ([place])   [facility] [place], Coralie Keens, NP   1 year ago Controlled type 2 diabetes mellitus with hyperglycemia, without long-term current use of insulin ([place])   [facility], Coralie Keens, NP      Future Appointments            In 5 months Baity, Coralie Keens, NP [facility] [place] within normal limits and completed in the last 12 months    WBC  Date Value Ref Range Status  05/09/2022 8.4 4.0 - 10.5 K/uL Final   RBC  Date Value Ref Range Status  05/09/2022 4.38 3.87 - 5.11 MIL/uL Final   Hemoglobin  Date Value Ref Range Status  05/09/2022 11.4 (L) 12.0 - 15.0 g/dL Final   HCT  Date Value Ref Range Status  05/09/2022 36.5 36.0 - 46.0 % Final   MCHC  Date Value Ref Range Status  05/09/2022 31.2 30.0 - 36.0 g/dL Final   [facility]  Date Value Ref Range Status  05/09/2022 26.0 26.0 - 34.0 pg Final   MCV  Date Value Ref Range Status  05/09/2022 83.3 80.0 - 100.0 fL Final   No results found for: "PLTCOUNTKUC", "LABPLAT", "POCPLA" RDW  Date Value Ref Range Status  05/09/2022 17.4 (H) 11.5 - 15.5 % Final

## 2022-07-18 NOTE — Therapy (Signed)
OUTPATIENT PHYSICAL THERAPY TREATMENT NOTE   Patient Name: Chelsea Hudson MRN: [id number] DOB:1966/07/11, 56 y.o., female Today's Date: 06/20/2022  PCP: Webb Silversmith NP REFERRING PROVIDER: Earlie Server MD  END OF SESSION:   PT End of Session - 06/20/22 0758     Visit Number 4    Number of Visits 17    Date for PT Re-Evaluation 08/04/22    Authorization - Visit Number 4    Authorization - Number of Visits 17    Progress Note Due on Visit 10    PT Start Time 0753    PT Stop Time 0831    PT Time Calculation (min) 38 min    Activity Tolerance Patient tolerated treatment well    Behavior During Therapy [facility] for tasks assessed/performed               Past Medical History:  Diagnosis Date   Arthritis    SHOULDER   Breast cancer ([place]) 02/2017   rt breast   Breast cancer ([place]) 2018   Cancer ([place]) 02/28/2017   INVASIVE MAMMARY CARCINOMA WITH MUCINOUS FEATURES.   Colonic diverticular abscess 06/21/2017   Colonoscopy 06/25/2017: No evidence of malignancy.   Diabetes mellitus without complication ([place])    Hypertension    Irregular heart beat    PT STATES IT "SKIPS A BEAT"    Obesity    Personal history of chemotherapy    Personal history of radiation therapy    Past Surgical History:  Procedure Laterality Date   ANKLE SURGERY     BREAST BIOPSY Right 02/28/2017   INVASIVE MAMMARY CARCINOMA WITH MUCINOUS FEATURES.   BREAST CYST ASPIRATION Right    NEG   COLONOSCOPY WITH PROPOFOL N/A 06/25/2017   Procedure: COLONOSCOPY WITH PROPOFOL;  Surgeon: Jonathon Bellows, MD;  Location: [facility] ENDOSCOPY;  Service: Gastroenterology;  Laterality: N/A;   MASTECTOMY, PARTIAL Right 07/30/2017   Procedure: MASTECTOMY PARTIAL;  Surgeon: Robert Bellow, MD;  Location: [facility] ORS;  Service: General;  Laterality: Right;   PORTACATH PLACEMENT Left 03/15/2017   Procedure: INSERTION PORT-A-CATH;  Surgeon: Robert Bellow, MD;  Location: [facility] ORS;  Service: General;  Laterality: Left;   RE-EXCISION OF  BREAST LUMPECTOMY Right 08/17/2017    INVASIVE CARCINOMA EXTENDS TO NEW LATERAL MARGIN. /RE-EXCISION OF BREAST LUMPECTOMY;: [place], [place] Gleason, MD;  [facility] ORS; General;  Laterality: Right;   SENTINEL NODE BIOPSY Right 07/30/2017   Procedure: SENTINEL NODE BIOPSY;  Surgeon: Robert Bellow, MD;  Location: [facility] ORS;  Service: General;  Laterality: Right;   SIMPLE MASTECTOMY WITH AXILLARY SENTINEL NODE BIOPSY Right 09/28/2017   Procedure: SIMPLE MASTECTOMY;  Surgeon: Robert Bellow, MD;  Location: [facility] ORS;  Service: General;  Laterality: Right;   Patient Active Problem List   Diagnosis Date Noted   Aromatase inhibitor use 05/09/2022   Elevated serum creatinine 05/09/2022   Osteoarthritis 06/09/2021   Anemia 06/09/2021   Class 2 obesity due to excess calories with body mass index (BMI) of 36.0 to 36.9 in adult 06/09/2021   Hyperlipidemia associated with type 2 diabetes mellitus ([place]) 10/02/2018   Neuropathy due to chemotherapeutic drug ([place]) 10/02/2018   Controlled type 2 diabetes mellitus with hyperglycemia ([place]) 07/05/2018   Essential hypertension 01/05/2016    REFERRING DIAG: Chronic R shoulder pain   THERAPY DIAG:  Chronic right shoulder pain  Rationale for Evaluation and Treatment Rehabilitation  PERTINENT HISTORY: Pt is a 56 year old female familar with this clinic. Insideous onset in May of this year  started having some increased swelling and pain in her R shoulder, and was not able to lift it as high as the L. 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 Has difficulty lifting herself up into the truck (grabbing overhead handle). She has difficulty with overhead ADLS (reaching into overhead cabinet, cleaning her TV and windows. Pt completes line dancing 3x/week for exercises. Is  still wanting to complete R breast reconstruction, but per patient MD would like her to lose more weight prior. Pt is R handed. Pt denies N/V, B&B changes, unexplained weight fluctuation, saddle paresthesia, fever, night sweats, or unrelenting night pain at this time.  PRECAUTIONS: none  SUBJECTIVE: No pain on arrival. Reports compliance with HEP. Patient reports her motion is improving and she is tying to use her UE more, cannot reach scar for scar massage.   PAIN:  Are you having pain? No   OBJECTIVE: (objective measures completed at initial evaluation unless otherwise dated)  Ther-Ex Pulleys flex and abd x12 each  Y on wall 2x8 2# DB with min cuing for scapulohumeral rhythm Wt'd overhead ball taps on wall 2x 15sec (2lb ball)  Seated overhead press 2# DB bilat hands 3x 6/8/8 with cuing for scapulohumeral rhythm without protraction with good carry over of; TC of wall  Post capsule stretch x30sec Doorway pec stretch 30sec  Seated lat stretch 30sec  Manual STM with trigger point release to latissimus dorsi G3 mob with movement in all directions  PROM all directions  Clinical Impression: PT continued therex progression for increased mobility, and strength for scpaulohumeral rhythm needed for overhead motions with success. Pt is able to comply with all cuing for proper technique with continued improvement in mobility. Pt with good effort throughout session with no pain throughout. PT will continue progression as able.      PT Short Term Goals - 06/06/22 1031       PT SHORT TERM GOAL #1   Title Pt will be independent with HEP in order to decrease shoulder pain and increase strength in order to improve pain-free function at home and work.    Baseline 06/06/22 HEP given ; 07/18/22 completing current HEP    Time 5    Period Weeks    Status Achieved               PT Long Term Goals - 06/06/22 1032       PT LONG TERM GOAL #1   Title Patient will increase FOTO score to 59 to  demonstrate predicted increase in functional mobility to complete ADLs    Baseline 06/06/22 46    Time 8    Period Weeks    Status deferred      PT LONG TERM GOAL #2   Title Pt will demonstrate full R shoulder AROM in order to complete overhead and self care ADLs.    Baseline 06/06/22 flex: 103d abd 94d ER Apleys C3  07/11/22 flex: 125d abd 118d ER Apleys C7 with increased time   Time 8    Period Weeks    Status ongoing      PT LONG TERM GOAL #3   Title Pt will demonstrate 4+/5 gross shoulder and periscapular strength in order to complete heavy household ADLs    Baseline 06/06/22 R/L Flex  4-/5; ER 4+/5; Y lower trap 3+/4; T scap retractors 4-/4+ 07/11/22 R/L Flex 4/5; ER 4+/5; Y lower trap 4-/4; T scap retractors 4/4+   Time 8    Period Weeks    Status ongoing               Durwin Reges DPT Durwin Reges, PT 07/11/2022, 8:15 AM

## 2022-07-21 ENCOUNTER — Other Ambulatory Visit: Payer: Self-pay | Admitting: Internal Medicine

## 2022-07-21 ENCOUNTER — Encounter: Payer: Medicaid Other | Admitting: Physical Therapy

## 2022-07-21 DIAGNOSIS — I10 Essential (primary) hypertension: Secondary | ICD-10-CM

## 2022-07-21 NOTE — Telephone Encounter (Signed)
Requested Prescriptions  Pending Prescriptions Disp Refills  . triamterene-hydrochlorothiazide (MAXZIDE-25) 37.5-25 MG tablet [Pharmacy Med Name: Triamterene-HCTZ 37.5-25 MG Oral Tablet] 100 tablet 1    Sig: TAKE 1 TABLET BY MOUTH DAILY     Cardiovascular: Diuretic Combos Failed - 07/21/2022  8:41 AM      Failed - Cr in normal range and within 180 days    Creat  Date Value Ref Range Status  06/20/2022 1.19 (H) 0.50 - 1.03 mg/dL Final   Creatinine, Urine  Date Value Ref Range Status  12/20/2021 74 20 - 275 mg/dL Final         Passed - K in normal range and within 180 days    Potassium  Date Value Ref Range Status  06/20/2022 4.1 3.5 - 5.3 mmol/L Final         Passed - Na in normal range and within 180 days    Sodium  Date Value Ref Range Status  06/20/2022 140 135 - 146 mmol/L Final         Passed - Last BP in normal range    BP Readings from Last 1 Encounters:  06/20/22 102/64         Passed - Valid encounter within last 6 months    Recent Outpatient Visits          1 month ago Controlled type 2 diabetes mellitus with hyperglycemia, without long-term current use of insulin ([place])   [facility] [place], Coralie Keens, NP   2 months ago Acute pain of right shoulder   [place], Devonne Doughty, DO   7 months ago Encounter for general adult medical examination with abnormal findings   [facility] [place], Coralie Keens, NP   10 months ago Controlled type 2 diabetes mellitus with hyperglycemia, without long-term current use of insulin [facility])   [facility] [place], Coralie Keens, NP   1 year ago Controlled type 2 diabetes mellitus with hyperglycemia, without long-term current use of insulin [facility] [place])   [facility] [place], Coralie Keens, NP      Future Appointments            In 5 months Baity, Coralie Keens, NP [facility]

## 2022-07-25 ENCOUNTER — Encounter: Payer: Medicaid Other | Admitting: Physical Therapy

## 2022-07-25 ENCOUNTER — Ambulatory Visit: Payer: Medicare Other | Attending: Oncology | Admitting: Physical Therapy

## 2022-07-25 DIAGNOSIS — M25511 Pain in right shoulder: Secondary | ICD-10-CM | POA: Insufficient documentation

## 2022-07-25 DIAGNOSIS — G8929 Other chronic pain: Secondary | ICD-10-CM | POA: Insufficient documentation

## 2022-07-26 ENCOUNTER — Encounter: Payer: Medicaid Other | Admitting: Physical Therapy

## 2022-08-01 ENCOUNTER — Encounter: Payer: Self-pay | Admitting: Physical Therapy

## 2022-08-01 ENCOUNTER — Encounter: Payer: Medicaid Other | Admitting: Physical Therapy

## 2022-08-01 ENCOUNTER — Ambulatory Visit: Payer: Medicare Other | Admitting: Physical Therapy

## 2022-08-01 DIAGNOSIS — G8929 Other chronic pain: Secondary | ICD-10-CM | POA: Diagnosis not present

## 2022-08-01 DIAGNOSIS — M25511 Pain in right shoulder: Secondary | ICD-10-CM | POA: Diagnosis not present

## 2022-08-01 NOTE — Therapy (Signed)
OUTPATIENT PHYSICAL THERAPY TREATMENT NOTE   Patient Name: Chelsea Hudson MRN: [id number] DOB:1966/07/11, 56 y.o., female Today's Date: 06/20/2022  PCP: Webb Silversmith NP REFERRING PROVIDER: Earlie Server MD  END OF SESSION:   PT End of Session - 06/20/22 0758     Visit Number 4    Number of Visits 17    Date for PT Re-Evaluation 08/04/22    Authorization - Visit Number 4    Authorization - Number of Visits 17    Progress Note Due on Visit 10    PT Start Time 0753    PT Stop Time 0831    PT Time Calculation (min) 38 min    Activity Tolerance Patient tolerated treatment well    Behavior During Therapy [facility] for tasks assessed/performed               Past Medical History:  Diagnosis Date   Arthritis    SHOULDER   Breast cancer ([place]) 02/2017   rt breast   Breast cancer ([place]) 2018   Cancer ([place]) 02/28/2017   INVASIVE MAMMARY CARCINOMA WITH MUCINOUS FEATURES.   Colonic diverticular abscess 06/21/2017   Colonoscopy 06/25/2017: No evidence of malignancy.   Diabetes mellitus without complication ([place])    Hypertension    Irregular heart beat    PT STATES IT "SKIPS A BEAT"    Obesity    Personal history of chemotherapy    Personal history of radiation therapy    Past Surgical History:  Procedure Laterality Date   ANKLE SURGERY     BREAST BIOPSY Right 02/28/2017   INVASIVE MAMMARY CARCINOMA WITH MUCINOUS FEATURES.   BREAST CYST ASPIRATION Right    NEG   COLONOSCOPY WITH PROPOFOL N/A 06/25/2017   Procedure: COLONOSCOPY WITH PROPOFOL;  Surgeon: Jonathon Bellows, MD;  Location: [facility] ENDOSCOPY;  Service: Gastroenterology;  Laterality: N/A;   MASTECTOMY, PARTIAL Right 07/30/2017   Procedure: MASTECTOMY PARTIAL;  Surgeon: Robert Bellow, MD;  Location: [facility] ORS;  Service: General;  Laterality: Right;   PORTACATH PLACEMENT Left 03/15/2017   Procedure: INSERTION PORT-A-CATH;  Surgeon: Robert Bellow, MD;  Location: [facility] ORS;  Service: General;  Laterality: Left;   RE-EXCISION OF  BREAST LUMPECTOMY Right 08/17/2017    INVASIVE CARCINOMA EXTENDS TO NEW LATERAL MARGIN. /RE-EXCISION OF BREAST LUMPECTOMY;: [place], [place] Gleason, MD;  [facility] ORS; General;  Laterality: Right;   SENTINEL NODE BIOPSY Right 07/30/2017   Procedure: SENTINEL NODE BIOPSY;  Surgeon: Robert Bellow, MD;  Location: [facility] ORS;  Service: General;  Laterality: Right;   SIMPLE MASTECTOMY WITH AXILLARY SENTINEL NODE BIOPSY Right 09/28/2017   Procedure: SIMPLE MASTECTOMY;  Surgeon: Robert Bellow, MD;  Location: [facility] ORS;  Service: General;  Laterality: Right;   Patient Active Problem List   Diagnosis Date Noted   Aromatase inhibitor use 05/09/2022   Elevated serum creatinine 05/09/2022   Osteoarthritis 06/09/2021   Anemia 06/09/2021   Class 2 obesity due to excess calories with body mass index (BMI) of 36.0 to 36.9 in adult 06/09/2021   Hyperlipidemia associated with type 2 diabetes mellitus ([place]) 10/02/2018   Neuropathy due to chemotherapeutic drug ([place]) 10/02/2018   Controlled type 2 diabetes mellitus with hyperglycemia ([place]) 07/05/2018   Essential hypertension 01/05/2016    REFERRING DIAG: Chronic R shoulder pain   THERAPY DIAG:  Chronic right shoulder pain  Rationale for Evaluation and Treatment Rehabilitation  PERTINENT HISTORY: Pt is a 56 year old female familar with this clinic. Insideous onset in May of this year  started having some increased swelling and pain in her R shoulder, and was not able to lift it as high as the L. 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 Has difficulty lifting herself up into the truck (grabbing overhead handle). She has difficulty with overhead ADLS (reaching into overhead cabinet, cleaning her TV and windows. Pt completes line dancing 3x/week for exercises. Is  still wanting to complete R breast reconstruction, but per patient MD would like her to lose more weight prior. Pt is R handed. Pt denies N/V, B&B changes, unexplained weight fluctuation, saddle paresthesia, fever, night sweats, or unrelenting night pain at this time.  PRECAUTIONS: none  SUBJECTIVE: No pain on arrival. Reports compliance with HEP. Patient reports improving ROM   PAIN:  Are you having pain? No   OBJECTIVE: (objective measures completed at initial evaluation unless otherwise dated)  Ther-Ex Pulleys flex and abd x12 each  Total gym pull up 3x 6 L7 with good carry over of cuing D1 GTB diagonal pull 2x 10 with good carry over cuing for scapulohumeral rhythm Post capsule stretch x30sec Doorway pec stretch 30sec    Manual STM with trigger point release to latissimus dorsi G3 mob with movement in all directions  PROM all directions  Clinical Impression: Session shortened d/t pt arriving late. PT continued therex progression for increased mobility, and strength for scpaulohumeral rhythm needed for overhead motions with success. Pt is able to comply with all cuing for proper technique with continued improvement in mobility. Pt with good effort throughout session with no pain throughout. PT will continue progression as able.      PT Short Term Goals - 06/06/22 1031       PT SHORT TERM GOAL #1   Title Pt will be independent with HEP in order to decrease shoulder pain and increase strength in order to improve pain-free function at home and work.    Baseline 06/06/22 HEP given ; 07/18/22 completing current HEP    Time 5    Period Weeks    Status Achieved               PT Long Term Goals - 06/06/22 1032       PT LONG TERM GOAL #1   Title Patient will increase FOTO score to 59 to demonstrate predicted increase in functional mobility to complete ADLs    Baseline 06/06/22 46    Time 8    Period Weeks    Status deferred      PT LONG TERM GOAL #2   Title Pt will  demonstrate full R shoulder AROM in order to complete overhead and self care ADLs.    Baseline 06/06/22 flex: 103d abd 94d ER Apleys C3  07/11/22 flex: 125d abd 118d ER Apleys C7 with increased time   Time 8    Period Weeks    Status ongoing      PT LONG TERM GOAL #3   Title Pt will demonstrate 4+/5 gross shoulder and periscapular strength in order to complete heavy household ADLs    Baseline 06/06/22 R/L Flex 4-/5; ER 4+/5; Y lower trap 3+/4; T scap retractors 4-/4+ 07/11/22 R/L Flex 4/5; ER 4+/5; Y lower trap 4-/4; T scap retractors 4/4+   Time 8    Period Weeks  Status ongoing               Durwin Reges DPT Durwin Reges, PT 07/11/2022, 8:15 AM

## 2022-08-03 ENCOUNTER — Encounter: Payer: Medicaid Other | Admitting: Physical Therapy

## 2022-08-07 ENCOUNTER — Encounter: Payer: Medicaid Other | Admitting: Physical Therapy

## 2022-08-08 ENCOUNTER — Encounter: Payer: Self-pay | Admitting: Physical Therapy

## 2022-08-08 ENCOUNTER — Encounter: Payer: Medicaid Other | Admitting: Physical Therapy

## 2022-08-08 ENCOUNTER — Ambulatory Visit: Payer: Medicare Other | Admitting: Physical Therapy

## 2022-08-08 DIAGNOSIS — M25511 Pain in right shoulder: Secondary | ICD-10-CM | POA: Diagnosis not present

## 2022-08-08 DIAGNOSIS — G8929 Other chronic pain: Secondary | ICD-10-CM | POA: Diagnosis not present

## 2022-08-08 NOTE — Therapy (Signed)
OUTPATIENT PHYSICAL THERAPY TREATMENT NOTE   Patient Name: Chelsea Hudson MRN: [id number] DOB:1966/07/11, 56 y.o., female Today's Date: 06/20/2022  PCP: Webb Silversmith NP REFERRING PROVIDER: Earlie Server MD  END OF SESSION:   PT End of Session - 06/20/22 0758     Visit Number 4    Number of Visits 17    Date for PT Re-Evaluation 08/04/22    Authorization - Visit Number 4    Authorization - Number of Visits 17    Progress Note Due on Visit 10    PT Start Time 0753    PT Stop Time 0831    PT Time Calculation (min) 38 min    Activity Tolerance Patient tolerated treatment well    Behavior During Therapy [facility] for tasks assessed/performed               Past Medical History:  Diagnosis Date   Arthritis    SHOULDER   Breast cancer ([place]) 02/2017   rt breast   Breast cancer ([place]) 2018   Cancer ([place]) 02/28/2017   INVASIVE MAMMARY CARCINOMA WITH MUCINOUS FEATURES.   Colonic diverticular abscess 06/21/2017   Colonoscopy 06/25/2017: No evidence of malignancy.   Diabetes mellitus without complication ([place])    Hypertension    Irregular heart beat    PT STATES IT "SKIPS A BEAT"    Obesity    Personal history of chemotherapy    Personal history of radiation therapy    Past Surgical History:  Procedure Laterality Date   ANKLE SURGERY     BREAST BIOPSY Right 02/28/2017   INVASIVE MAMMARY CARCINOMA WITH MUCINOUS FEATURES.   BREAST CYST ASPIRATION Right    NEG   COLONOSCOPY WITH PROPOFOL N/A 06/25/2017   Procedure: COLONOSCOPY WITH PROPOFOL;  Surgeon: Jonathon Bellows, MD;  Location: [facility] ENDOSCOPY;  Service: Gastroenterology;  Laterality: N/A;   MASTECTOMY, PARTIAL Right 07/30/2017   Procedure: MASTECTOMY PARTIAL;  Surgeon: Robert Bellow, MD;  Location: [facility] ORS;  Service: General;  Laterality: Right;   PORTACATH PLACEMENT Left 03/15/2017   Procedure: INSERTION PORT-A-CATH;  Surgeon: Robert Bellow, MD;  Location: [facility] ORS;  Service: General;  Laterality: Left;   RE-EXCISION OF  BREAST LUMPECTOMY Right 08/17/2017    INVASIVE CARCINOMA EXTENDS TO NEW LATERAL MARGIN. /RE-EXCISION OF BREAST LUMPECTOMY;: Byrnett, Forest Gleason, MD;  [facility] ORS; General;  Laterality: Right;   SENTINEL NODE BIOPSY Right 07/30/2017   Procedure: SENTINEL NODE BIOPSY;  Surgeon: Robert Bellow, MD;  Location: [facility] ORS;  Service: General;  Laterality: Right;   SIMPLE MASTECTOMY WITH AXILLARY SENTINEL NODE BIOPSY Right 09/28/2017   Procedure: SIMPLE MASTECTOMY;  Surgeon: Robert Bellow, MD;  Location: [facility] ORS;  Service: General;  Laterality: Right;   Patient Active Problem List   Diagnosis Date Noted   Aromatase inhibitor use 05/09/2022   Elevated serum creatinine 05/09/2022   Osteoarthritis 06/09/2021   Anemia 06/09/2021   Class 2 obesity due to excess calories with body mass index (BMI) of 36.0 to 36.9 in adult 06/09/2021   Hyperlipidemia associated with type 2 diabetes mellitus ([place]) 10/02/2018   Neuropathy due to chemotherapeutic drug ([place]) 10/02/2018   Controlled type 2 diabetes mellitus with hyperglycemia ([place]) 07/05/2018   Essential hypertension 01/05/2016    REFERRING DIAG: Chronic R shoulder pain   THERAPY DIAG:  Chronic right shoulder pain  Rationale for Evaluation and Treatment Rehabilitation  PERTINENT HISTORY: Pt is a 56 year old female familar with this clinic. Insideous onset in May of this year  started having some increased swelling and pain in her R shoulder, and was not able to lift it as high as the L. 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 Has difficulty lifting herself up into the truck (grabbing overhead handle). She has difficulty with overhead ADLS (reaching into overhead cabinet, cleaning her TV and windows. Pt completes line dancing 3x/week for exercises. Is  still wanting to complete R breast reconstruction, but per patient MD would like her to lose more weight prior. Pt is R handed. Pt denies N/V, B&B changes, unexplained weight fluctuation, saddle paresthesia, fever, night sweats, or unrelenting night pain at this time.  PRECAUTIONS: none  SUBJECTIVE: No pain on arrival. Reports compliance with HEP. Patient reports improving ROM- that is is "wonderful" today.   PAIN:  Are you having pain? No   OBJECTIVE: (objective measures completed at initial evaluation unless otherwise dated)  Ther-Ex Pulleys flex and abd x12 each   PT reviewed the following HEP with patient with patient able to demonstrate a set of the following with min cuing for correction needed. PT educated patient on parameters of therex (how/when to inc/decrease intensity, frequency, rep/set range, stretch hold time, and purpose of therex) with verbalized understanding.   Access Code: [email or web address] - Prone Scapular Retraction Y  - 1 x daily - 1-2 x weekly - 3 sets - 8-12 reps - Prone Scapular Retraction Arms at Side  - 1 x daily - 1-2 x weekly - 3 sets - 8-12 reps - Shoulder PNF D2 with Resistance  - 1 x daily - 1-2 x weekly - 3 sets - 8-12 reps - Shoulder External Rotation and Scapular Retraction with Resistance  - 1 x daily - 1-2 x weekly - 3 sets - 8-12 reps - Doorway Pec Stretch at 120 Degrees Abduction  - 1-3 x daily - 7 x weekly - 30-60sec hold - Latissimus Dorsi Stretch at Wall  - 1-3 x daily - 7 x weekly - 30-60sec hold - Standing Shoulder Posterior Capsule Stretch  - 1-3 x daily - 7 x weekly - 30-60sec hold   Clinical Impression: PT reassessed goals where patient has met all strength, motion, pain, and subjective ability goals. Patient with increased time needed to achieve full AROM on testing, but ultimately able. PT reviewed HEP with patient for maintaining of strengthening as well as muscle lengthening with patient able to demonstrate understanding of technique, and  verbalize understanding of education on purpose, parameters of strength vs. Stretching. PT will plan on discharging patient next session should she complete HEP given today with no setbacks.      PT Short Term Goals - 06/06/22 1031       PT SHORT TERM GOAL #1   Title Pt will be independent with HEP in order to decrease shoulder pain and increase strength in order to improve pain-free function at home and work.    Baseline 06/06/22 HEP given ; 07/18/22 completing current HEP    Time 5    Period Weeks    Status Achieved               PT Long Term Goals - 06/06/22 1032       PT LONG TERM GOAL #1   Title Patient will increase FOTO score to 59 to  demonstrate predicted increase in functional mobility to complete ADLs    Baseline 06/06/22 46    Time 8    Period Weeks    Status deferred      PT LONG TERM GOAL #2   Title Pt will demonstrate full R shoulder AROM in order to complete overhead and self care ADLs.    Baseline 06/06/22 flex: 103d abd 94d ER Apleys C3  07/11/22 flex: 125d abd 118d ER Apleys C7 with increased time   Time 8    Period Weeks    Status ongoing      PT LONG TERM GOAL #3   Title Pt will demonstrate 4+/5 gross shoulder and periscapular strength in order to complete heavy household ADLs    Baseline 06/06/22 R/L Flex 4-/5; ER 4+/5; Y lower trap 3+/4; T scap retractors 4-/4+ 07/11/22 R/L Flex 4/5; ER 4+/5; Y lower trap 4-/4; T scap retractors 4/4+   Time 8    Period Weeks    Status ongoing               Durwin Reges DPT Durwin Reges, PT 07/11/2022, 8:15 AM

## 2022-08-10 ENCOUNTER — Encounter: Payer: Medicaid Other | Admitting: Physical Therapy

## 2022-08-15 ENCOUNTER — Encounter: Payer: Self-pay | Admitting: Physical Therapy

## 2022-08-15 ENCOUNTER — Encounter: Payer: Medicaid Other | Admitting: Physical Therapy

## 2022-08-15 ENCOUNTER — Ambulatory Visit: Payer: Medicare Other | Admitting: Physical Therapy

## 2022-08-15 DIAGNOSIS — M25511 Pain in right shoulder: Secondary | ICD-10-CM | POA: Diagnosis not present

## 2022-08-15 DIAGNOSIS — G8929 Other chronic pain: Secondary | ICD-10-CM

## 2022-08-15 NOTE — Addendum Note (Signed)
Addended by: Kelton Pillar on: 08/15/2022 08:32 AM   Modules accepted: Orders

## 2022-08-15 NOTE — Therapy (Addendum)
OUTPATIENT PHYSICAL THERAPY TREATMENT NOTE/DC SUMMARY   Patient Name: Chelsea Hudson MRN: [id number] DOB:Feb 19, 1966, 56 y.o., female Today's Date: 06/20/2022  PCP: Webb Silversmith NP REFERRING PROVIDER: Earlie Server MD  END OF SESSION:   PT End of Session - 06/20/22 0758     Visit Number 4    Number of Visits 17    Date for PT Re-Evaluation 08/04/22    Authorization - Visit Number 4    Authorization - Number of Visits 17    Progress Note Due on Visit 10    PT Start Time 0753    PT Stop Time 0831    PT Time Calculation (min) 38 min    Activity Tolerance Patient tolerated treatment well    Behavior During Therapy [facility] for tasks assessed/performed               Past Medical History:  Diagnosis Date   Arthritis    SHOULDER   Breast cancer ([place]) 02/2017   rt breast   Breast cancer ([place]) 2018   Cancer ([place]) 02/28/2017   INVASIVE MAMMARY CARCINOMA WITH MUCINOUS FEATURES.   Colonic diverticular abscess 06/21/2017   Colonoscopy 06/25/2017: No evidence of malignancy.   Diabetes mellitus without complication ([place])    Hypertension    Irregular heart beat    PT STATES IT "SKIPS A BEAT"    Obesity    Personal history of chemotherapy    Personal history of radiation therapy    Past Surgical History:  Procedure Laterality Date   ANKLE SURGERY     BREAST BIOPSY Right 02/28/2017   INVASIVE MAMMARY CARCINOMA WITH MUCINOUS FEATURES.   BREAST CYST ASPIRATION Right    NEG   COLONOSCOPY WITH PROPOFOL N/A 06/25/2017   Procedure: COLONOSCOPY WITH PROPOFOL;  Surgeon: Jonathon Bellows, MD;  Location: [facility] ENDOSCOPY;  Service: Gastroenterology;  Laterality: N/A;   MASTECTOMY, PARTIAL Right 07/30/2017   Procedure: MASTECTOMY PARTIAL;  Surgeon: Robert Bellow, MD;  Location: [facility] ORS;  Service: General;  Laterality: Right;   PORTACATH PLACEMENT Left 03/15/2017   Procedure: INSERTION PORT-A-CATH;  Surgeon: Robert Bellow, MD;  Location: [facility] ORS;  Service: General;  Laterality: Left;    RE-EXCISION OF BREAST LUMPECTOMY Right 08/17/2017    INVASIVE CARCINOMA EXTENDS TO NEW LATERAL MARGIN. /RE-EXCISION OF BREAST LUMPECTOMY;: Byrnett, Forest Gleason, MD;  [facility] ORS; General;  Laterality: Right;   SENTINEL NODE BIOPSY Right 07/30/2017   Procedure: SENTINEL NODE BIOPSY;  Surgeon: Robert Bellow, MD;  Location: [facility] ORS;  Service: General;  Laterality: Right;   SIMPLE MASTECTOMY WITH AXILLARY SENTINEL NODE BIOPSY Right 09/28/2017   Procedure: SIMPLE MASTECTOMY;  Surgeon: Robert Bellow, MD;  Location: [facility] ORS;  Service: General;  Laterality: Right;   Patient Active Problem List   Diagnosis Date Noted   Aromatase inhibitor use 05/09/2022   Elevated serum creatinine 05/09/2022   Osteoarthritis 06/09/2021   Anemia 06/09/2021   Class 2 obesity due to excess calories with body mass index (BMI) of 36.0 to 36.9 in adult 06/09/2021   Hyperlipidemia associated with type 2 diabetes mellitus ([place]) 10/02/2018   Neuropathy due to chemotherapeutic drug ([place]) 10/02/2018   Controlled type 2 diabetes mellitus with hyperglycemia ([place]) 07/05/2018   Essential hypertension 01/05/2016    REFERRING DIAG: Chronic R shoulder pain   THERAPY DIAG:  Chronic right shoulder pain  Rationale for Evaluation and Treatment Rehabilitation  PERTINENT HISTORY: Pt is a 56 year old female familar with this clinic. Insideous onset in May of this  year started having some increased swelling and pain in her R shoulder, and was not able to lift it as high as the L. 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 Has difficulty lifting herself up into the truck (grabbing overhead handle). She has difficulty with overhead ADLS (reaching into overhead cabinet, cleaning her TV and windows. Pt completes line dancing 3x/week for  exercises. Is still wanting to complete R breast reconstruction, but per patient MD would like her to lose more weight prior. Pt is R handed. Pt denies N/V, B&B changes, unexplained weight fluctuation, saddle paresthesia, fever, night sweats, or unrelenting night pain at this time.  PRECAUTIONS: none  SUBJECTIVE: No pain on arrival. Reports compliance with HEP. Patient reports improving ROM- that is is "wonderful" today.   PAIN:  Are you having pain? No   OBJECTIVE: (objective measures completed at initial evaluation unless otherwise dated)  Ther-Ex UBE L4 80mn fwd/236m bwd  MMT testing Verbal review of the following therex with demonstrative review of Y with min cuing for technique  Access Code: [email or web address] - Prone Scapular Retraction Y  - 1 x daily - 1-2 x weekly - 3 sets - 8-12 reps - Prone Scapular Retraction Arms at Side  - 1 x daily - 1-2 x weekly - 3 sets - 8-12 reps - Shoulder PNF D2 with Resistance  - 1 x daily - 1-2 x weekly - 3 sets - 8-12 reps - Shoulder External Rotation and Scapular Retraction with Resistance  - 1 x daily - 1-2 x weekly - 3 sets - 8-12 reps - Doorway Pec Stretch at 120 Degrees Abduction  - 1-3 x daily - 7 x weekly - 30-60sec hold - Latissimus Dorsi Stretch at Wall  - 1-3 x daily - 7 x weekly - 30-60sec hold - Standing Shoulder Posterior Capsule Stretch  - 1-3 x daily - 7 x weekly - 30-60sec hold   Clinical Impression: PT reassessed goals this session where patient has met all goals to safely d/c formal PT. Patient is able to demonstrate and verbalize understanding of all HEP recommendations with minimal corrections needed. Pt given clinic contact info should further questions or concerns arise. Pt to d/c PT.       PT Short Term Goals - 06/06/22 1031       PT SHORT TERM GOAL #1   Title Pt will be independent with HEP in order to decrease shoulder pain and increase strength in order to improve pain-free function at home and work.    Baseline 06/06/22 HEP  given ; 07/18/22 completing current HEP    Time 5    Period Weeks    Status Achieved               PT Long Term Goals - 06/06/22 1032       PT LONG TERM GOAL #1   Title Patient will increase FOTO score to 59 to demonstrate predicted increase in functional mobility to complete ADLs    Baseline 06/06/22 46 08/15/22 78   Time 8    Period Weeks    Status Achieved       PT LONG TERM GOAL #2   Title Pt will demonstrate full R shoulder AROM in order to complete overhead and self care ADLs.  Baseline 06/06/22 flex: 103d abd 94d ER Apleys C3  07/11/22 flex: 125d abd 118d ER Apleys C7 with increased time; 08/15/22 full to PLOF   Time 8    Period Weeks    Status Achieved       PT LONG TERM GOAL #3   Title Pt will demonstrate 4+/5 gross shoulder and periscapular strength in order to complete heavy household ADLs    Baseline 06/06/22 R/L Flex 4-/5; ER 4+/5; Y lower trap 3+/4; T scap retractors 4-/4+ 07/11/22 R/L Flex 4/5; ER 4+/5; Y lower trap 4-/4; T scap retractors 4/4+; All 4+/4   Time 8    Period Weeks    Status Achieved                Durwin Reges DPT Durwin Reges, PT 07/11/2022, 8:15 AM

## 2022-08-17 ENCOUNTER — Encounter: Payer: Medicaid Other | Admitting: Physical Therapy

## 2022-08-22 ENCOUNTER — Encounter: Payer: Medicaid Other | Admitting: Physical Therapy

## 2022-08-29 ENCOUNTER — Encounter: Payer: Medicaid Other | Admitting: Physical Therapy

## 2022-09-05 ENCOUNTER — Encounter: Payer: Medicaid Other | Admitting: Physical Therapy

## 2022-11-08 ENCOUNTER — Inpatient Hospital Stay (HOSPITAL_BASED_OUTPATIENT_CLINIC_OR_DEPARTMENT_OTHER): Payer: 59 | Admitting: Oncology

## 2022-11-08 ENCOUNTER — Inpatient Hospital Stay: Payer: 59 | Attending: Oncology

## 2022-11-08 ENCOUNTER — Other Ambulatory Visit: Payer: Self-pay

## 2022-11-08 ENCOUNTER — Encounter: Payer: Self-pay | Admitting: Oncology

## 2022-11-08 VITALS — BP 126/79 | HR 81 | Temp 97.7°F | Wt 248.0 lb

## 2022-11-08 DIAGNOSIS — Z170 Estrogen receptor positive status [ER+]: Secondary | ICD-10-CM

## 2022-11-08 DIAGNOSIS — Z95828 Presence of other vascular implants and grafts: Secondary | ICD-10-CM

## 2022-11-08 DIAGNOSIS — N189 Chronic kidney disease, unspecified: Secondary | ICD-10-CM | POA: Diagnosis not present

## 2022-11-08 DIAGNOSIS — E1165 Type 2 diabetes mellitus with hyperglycemia: Secondary | ICD-10-CM

## 2022-11-08 DIAGNOSIS — Z923 Personal history of irradiation: Secondary | ICD-10-CM | POA: Diagnosis not present

## 2022-11-08 DIAGNOSIS — D631 Anemia in chronic kidney disease: Secondary | ICD-10-CM | POA: Diagnosis not present

## 2022-11-08 DIAGNOSIS — C50411 Malignant neoplasm of upper-outer quadrant of right female breast: Secondary | ICD-10-CM

## 2022-11-08 DIAGNOSIS — Z9221 Personal history of antineoplastic chemotherapy: Secondary | ICD-10-CM | POA: Insufficient documentation

## 2022-11-08 DIAGNOSIS — Z9011 Acquired absence of right breast and nipple: Secondary | ICD-10-CM | POA: Insufficient documentation

## 2022-11-08 DIAGNOSIS — D638 Anemia in other chronic diseases classified elsewhere: Secondary | ICD-10-CM | POA: Diagnosis not present

## 2022-11-08 DIAGNOSIS — Z853 Personal history of malignant neoplasm of breast: Secondary | ICD-10-CM

## 2022-11-08 DIAGNOSIS — R053 Chronic cough: Secondary | ICD-10-CM

## 2022-11-08 DIAGNOSIS — R059 Cough, unspecified: Secondary | ICD-10-CM | POA: Insufficient documentation

## 2022-11-08 DIAGNOSIS — Z79811 Long term (current) use of aromatase inhibitors: Secondary | ICD-10-CM

## 2022-11-08 LAB — CBC WITH DIFFERENTIAL/PLATELET
Abs Immature Granulocytes: 0.03 10*3/uL (ref 0.00–0.07)
Basophils Absolute: 0 10*3/uL (ref 0.0–0.1)
Basophils Relative: 0 %
Eosinophils Absolute: 0.2 10*3/uL (ref 0.0–0.5)
Eosinophils Relative: 2 %
HCT: 33.8 % — ABNORMAL LOW (ref 36.0–46.0)
Hemoglobin: 10.6 g/dL — ABNORMAL LOW (ref 12.0–15.0)
Immature Granulocytes: 0 %
Lymphocytes Relative: 19 %
Lymphs Abs: 1.3 10*3/uL (ref 0.7–4.0)
MCH: 25.2 pg — ABNORMAL LOW (ref 26.0–34.0)
MCHC: 31.4 g/dL (ref 30.0–36.0)
MCV: 80.5 fL (ref 80.0–100.0)
Monocytes Absolute: 0.3 10*3/uL (ref 0.1–1.0)
Monocytes Relative: 5 %
Neutro Abs: 5.3 10*3/uL (ref 1.7–7.7)
Neutrophils Relative %: 74 %
Platelets: 332 10*3/uL (ref 150–400)
RBC: 4.2 MIL/uL (ref 3.87–5.11)
RDW: 16.7 % — ABNORMAL HIGH (ref 11.5–15.5)
WBC: 7.2 10*3/uL (ref 4.0–10.5)
nRBC: 0 % (ref 0.0–0.2)

## 2022-11-08 LAB — RETIC PANEL
Immature Retic Fract: 11.6 % (ref 2.3–15.9)
RBC.: 4.16 MIL/uL (ref 3.87–5.11)
Retic Count, Absolute: 67.4 10*3/uL (ref 19.0–186.0)
Retic Ct Pct: 1.6 % (ref 0.4–3.1)
Reticulocyte Hemoglobin: 27.5 pg — ABNORMAL LOW (ref >27.9)

## 2022-11-08 LAB — COMPREHENSIVE METABOLIC PANEL
ALT: 28 U/L (ref 0–44)
AST: 23 U/L (ref 15–41)
Albumin: 3.3 g/dL — ABNORMAL LOW (ref 3.5–5.0)
Alkaline Phosphatase: 92 U/L (ref 38–126)
Anion gap: 8 (ref 5–15)
BUN: 23 mg/dL — ABNORMAL HIGH (ref 6–20)
CO2: 24 mmol/L (ref 22–32)
Calcium: 8.9 mg/dL (ref 8.9–10.3)
Chloride: 104 mmol/L (ref 98–111)
Creatinine, Ser: 1.24 mg/dL — ABNORMAL HIGH (ref 0.44–1.00)
GFR, Estimated: 52 mL/min — ABNORMAL LOW (ref >60)
Glucose, Bld: 276 mg/dL — ABNORMAL HIGH (ref 70–99)
Potassium: 3.5 mmol/L (ref 3.5–5.1)
Sodium: 136 mmol/L (ref 135–145)
Total Bilirubin: 0.2 mg/dL — ABNORMAL LOW (ref 0.3–1.2)
Total Protein: 6.9 g/dL (ref 6.5–8.1)

## 2022-11-08 LAB — FERRITIN: Ferritin: 68 ng/mL (ref 11–307)

## 2022-11-08 LAB — IRON AND TIBC
Iron: 39 ug/dL (ref 28–170)
Saturation Ratios: 14 % (ref 10.4–31.8)
TIBC: 274 ug/dL (ref 250–450)
UIBC: 235 ug/dL

## 2022-11-08 MED ORDER — LETROZOLE 2.5 MG PO TABS: 2.5000 mg | ORAL_TABLET | Freq: Every day | ORAL | 1 refills | Status: DC

## 2022-11-08 MED ORDER — SODIUM CHLORIDE 0.9% FLUSH
10.0000 mL | Freq: Once | INTRAVENOUS | Status: AC
  Administered 2022-11-08: 10 mL via INTRAVENOUS
  Filled 2022-11-08: qty 10

## 2022-11-08 MED ORDER — HEPARIN SOD (PORK) LOCK FLUSH 100 UNIT/ML IV SOLN
500.0000 [IU] | Freq: Once | INTRAVENOUS | Status: AC
  Administered 2022-11-08: 500 [IU] via INTRAVENOUS
  Filled 2022-11-08: qty 5

## 2022-11-09 DIAGNOSIS — C50411 Malignant neoplasm of upper-outer quadrant of right female breast: Secondary | ICD-10-CM | POA: Insufficient documentation

## 2022-11-09 DIAGNOSIS — Z170 Estrogen receptor positive status [ER+]: Secondary | ICD-10-CM | POA: Insufficient documentation

## 2022-11-09 LAB — CANCER ANTIGEN 27.29: CA 27.29: 16.1 U/mL (ref 0.0–38.6)

## 2022-11-10 ENCOUNTER — Ambulatory Visit: Admission: RE | Admit: 2022-11-10 | Payer: Medicare Other | Source: Ambulatory Visit

## 2022-11-10 DIAGNOSIS — R053 Chronic cough: Secondary | ICD-10-CM | POA: Insufficient documentation

## 2022-11-10 MED ORDER — IRON-VITAMIN C 65-125 MG PO TABS: 1.0000 | ORAL_TABLET | Freq: Every day | ORAL | 2 refills | Status: DC

## 2022-11-10 NOTE — Assessment & Plan Note (Addendum)
History of right breast cancer[ 2018] S/p right mastectomy, letrozole since 05/2018 Clinically, she is doing well. Exam reveals no evidence of recurrent disease, focal tight scarring tissue. . Continue annual screen mammogram of left breast.  Labs are reviewed and discussed with patient. CA 27.29 is stable and WNL Continue Letrozole 2.'5mg'$  daily.

## 2022-11-10 NOTE — Progress Notes (Signed)
Hematology/Oncology Progress note [phone] [phone]     Clinic Day:  11/08/2022   Referring physician: Jearld Fenton, NP  Assessment & Plan:  Cancer Staging  Malignant neoplasm of upper-outer quadrant of right breast in female, estrogen receptor positive ([place]) Staging form: Breast, AJCC 8th Edition - Clinical stage from 02/28/2017: Stage IIIB (cT3, cN3a, cM0, G2, ER+, PR+, HER2-) - Signed by Earlie Server, MD on 11/10/2022 - Pathologic: No Stage Recommended (ypT1c, pN3a, cM0, G2, ER+, PR+, HER2-) - Signed by Earlie Server, MD on 11/10/2022   Malignant neoplasm of upper-outer quadrant of right breast in female, estrogen receptor positive ([place]) History of right breast cancer. S/p right mastectomy  Clinically, she is doing well. Exam reveals no evidence of recurrent disease, focal tight scarring tissue. . Continue annual screen mammogram of left breast.  Labs are reviewed and discussed with patient. CA 27.29 is stable and WNL Continue Letrozole 2.37m daily.   Aromatase inhibitor use Bone density on 05/04/2021 was normal. Recommend patient to take calcium 12037mand vitamin D supplementation.   Persistent cough for 3 weeks or longer Check CT chest   Anemia in chronic kidney disease (CKD) Recommend patient to take oral iron supplenmentation.   Port-A-Cath in place Continue port flush    The patient understands the plans discussed today and is in agreement with them.  She knows to contact our office if she develops concerns prior to her next appointment.  Follow up in 6 months.   ZhEarlie ServerMD  Orders Placed This Encounter  Procedures   CT Chest Wo Contrast    CT - next available scan    Standing Status:   Future    Standing Expiration Date:   11/08/2023    Order Specific Question:   Is patient pregnant?    Answer:   No    Order Specific Question:   Preferred imaging location?    Answer:   [facility] 3D S[facility] BREAST UNI LEFT    Standing  Status:   Future    Standing Expiration Date:   11/09/2023    Order Specific Question:   Reason for Exam (SYMPTOM  OR DIAGNOSIS REQUIRED)    Answer:   history of breast cancer    Order Specific Question:   Preferred imaging location?    Answer:   [facility]    Order Specific Question:   Is the patient pregnant?    Answer:   No   Iron and TIBC   Retic Panel    Standing Status:   Future    Number of Occurrences:   1    Standing Expiration Date:   11/09/2023   Ferritin      Chief complaints: follow up for history of breast cancer.   HPI:  Patient previously followed up by Dr.Corcoran, patient switched care to me on 05/09/22 Extensive medical record review was performed by me  - stage IIIC invasive carcinoma of the upper outer quadrant of the right breast s/p neoadjuvant chemotherapy, surgery, and radiation.  Biopsy on 02/28/2017 revealed invasive carcinoma with mucinous features.  Lymph node biopsy revealed metastatic disease.  Tumor was ER positive (90%), PR positive (90%) and HER-2/neu negative.  She had clinical stage T3N3aM0 breast cancer.   PET scan on 03/09/2017 revealed hypermetabolic right axillary/subpectoral adenopathy.  There was low-grade activity in the right upper breast likely at the postoperative site.  Appearance was compatible with metastatic spread to right axillary/subpectoral lymph nodes.  There was  some sigmoid colon diverticulosis, with faint inflammatory findings adjacent to the sigmoid colon proximally, and with accentuated activity in the involved segment of the sigmoid colon.    Partial mastectomy and sentinel lymph node biopsy on 07/30/2017 revealed a 1.8 cm invasive mammary carcinoma with mucinous features.  There was lymphovascular invasion.  Eight of 12 lymph nodes were positive for metastatic disease.   She required multiple excisions to obtain clear margins resulting in a full mastectomy on 09/28/2017.  She underwent complete axillary node dissection on  03/04/2018.  Level 2 and 3 lymph node dissection revealed 3 of 3 lymph nodes positive for carcinoma.  The largest tumor deposit was at least 20 mm.  Extracapsular extension was present < 1 mm beyond the lymph node capsule.   She received 4 cycles of AC (03/22/2017 - 05/02/2017) and 7 of 12 cycles of neoadjuvant Taxol (05/16/2017 - 06/27/2017).  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  She received adjuvant radiation at [facility].  She began Letrozole in 05/2018.  Patient reports feeling well. Occasionally she has sharp pain at the previous mastectomy site. Otherwise she has no new complaints.  She tolerates Letrozole, with no side effects.  01/18/22 left screening mammogram negative.    INTERVAL HISTORY Chelsea Hudson is a 56 y.o. female who has above history reviewed by me today presents for follow up visit for history of Stage III right breast cancer.  She takes letrozole, tolerates well, with manageable side effects.  + persistent dry cough for 2 months.  + pain around her right mastectomy site, intermittent, usually triggered with stretching.  She follows up with lymphedema clinic/physical therapy   Review of Systems  Constitutional:  Negative for appetite change, chills, fatigue and fever.  HENT:   Negative for hearing loss and voice change.   Eyes:  Negative for eye problems.  Respiratory:  Negative for chest tightness and cough.   Cardiovascular:  Negative for chest pain.  Gastrointestinal:  Negative for abdominal distention, abdominal pain and blood in stool.  Endocrine: Negative for hot flashes.  Genitourinary:  Negative for difficulty urinating and frequency.   Musculoskeletal:  Negative for arthralgias.  Skin:  Negative for itching and rash.  Neurological:  Negative for extremity weakness.  Hematological:  Negative for adenopathy.  Psychiatric/Behavioral:  Negative for confusion.       VITALS:  Blood pressure 126/79, pulse 81, temperature 97.7 F  (36.5 C), temperature source Tympanic, weight 248 lb (112.5 kg), last menstrual period 01/31/2014, SpO2 100 %.  Wt Readings from Last 3 Encounters:  11/08/22 248 lb (112.5 kg)  06/20/22 241 lb (109.3 kg)  05/09/22 244 lb 4.8 oz (110.8 kg)    Body mass index is 40.03 kg/m.  Performance status (ECOG): 0 - Asymptomatic  PHYSICAL EXAM:  Physical Exam Constitutional:      General: She is not in acute distress.    Appearance: She is not diaphoretic.  HENT:     Head: Normocephalic and atraumatic.     Nose: Nose normal.     Mouth/Throat:     Pharynx: No oropharyngeal exudate.  Eyes:     General: No scleral icterus.    Pupils: Pupils are equal, round, and reactive to light.  Cardiovascular:     Rate and Rhythm: Normal rate and regular rhythm.     Heart sounds: No murmur heard. Pulmonary:     Effort: Pulmonary effort is normal. No respiratory distress.     Breath sounds: No rales.  Chest:  Chest wall: No tenderness.  Abdominal:     General: There is no distension.     Palpations: Abdomen is soft.     Tenderness: There is no abdominal tenderness.  Musculoskeletal:        General: Normal range of motion.     Cervical back: Normal range of motion and neck supple.  Skin:    General: Skin is warm and dry.     Findings: No erythema.  Neurological:     Mental Status: She is alert and oriented to person, place, and time.     Cranial Nerves: No cranial nerve deficit.     Motor: No abnormal muscle tone.     Coordination: Coordination normal.  Psychiatric:        Mood and Affect: Affect normal.     Breast exam is performed in seated and lying down position. Patient is status post right mastectomy. Focal tight scar tissue.  No palpable chest wall recurrence. No palpable bilateral axillary adenopathy  LABS:      Latest Ref Rng & Units 11/08/2022    2:00 PM 05/09/2022    1:43 PM 12/20/2021    3:31 PM  CBC  WBC 4.0 - 10.5 K/uL 7.2  8.4  9.2   Hemoglobin 12.0 - 15.0 g/dL 10.6   11.4  11.8   Hematocrit 36.0 - 46.0 % 33.8  36.5  37.1   Platelets 150 - 400 K/uL 332  336  341       Latest Ref Rng & Units 11/08/2022    2:00 PM 06/20/2022    3:50 PM 05/09/2022    1:43 PM  CMP  Glucose 70 - 99 mg/dL 276  109  140   BUN 6 - 20 mg/dL _0 Creatinine 0.44 - 1.00 mg/dL 1.24  1.19  1.24   Sodium 135 - 145 mmol/L 136  140  137   Potassium 3.5 - 5.1 mmol/L 3.5  4.1  3.8   Chloride 98 - 111 mmol/L 104  104  103   CO2 22 - 32 mmol/L _1 Calcium 8.9 - 10.3 mg/dL 8.9  9.9  9.1   Total Protein 6.5 - 8.1 g/dL 6.9  6.8  7.6   Total Bilirubin 0.3 - 1.2 mg/dL 0.2  0.2  0.4   Alkaline Phos 38 - 126 U/L 92   94   AST 15 - 41 U/L _2 ALT 0 - 44 U/L _3 STUDIES:  No results found.    HISTORY:   Past Medical History:  Diagnosis Date   Arthritis    SHOULDER   Breast cancer ([place]) 02/2017   rt breast   Breast cancer ([place]) 2018   Cancer ([place]) 02/28/2017   INVASIVE MAMMARY CARCINOMA WITH MUCINOUS FEATURES.   Colonic diverticular abscess 06/21/2017   Colonoscopy 06/25/2017: No evidence of malignancy.   Diabetes mellitus without complication ([place])    Hypertension    Irregular heart beat    PT STATES IT "SKIPS A BEAT"    Obesity    Personal history of chemotherapy    Personal history of radiation therapy     Past Surgical History:  Procedure Laterality Date   ANKLE SURGERY     BREAST BIOPSY Right 02/28/2017   INVASIVE MAMMARY CARCINOMA WITH MUCINOUS FEATURES.   BREAST CYST ASPIRATION Right    NEG   COLONOSCOPY WITH  PROPOFOL N/A 06/25/2017   Procedure: COLONOSCOPY WITH PROPOFOL;  Surgeon: Jonathon Bellows, MD;  Location[facility] ENDOSCOPY;  Service: Gastroenterology;  Laterality: N/A;   MASTECTOMY, PARTIAL Right 07/30/2017   Procedure: MASTECTOMY PARTIAL;  Surgeon: Robert Bellow, MD;  Location: [facility] ORS;  Service: General;  Laterality: Right;   PORTACATH PLACEMENT Left 03/15/2017   Procedure: INSERTION PORT-A-CATH;  Surgeon: Robert Bellow, MD;  Location: [facility] ORS;  Service: General;  Laterality: Left;   RE-EXCISION OF BREAST LUMPECTOMY Right 08/17/2017    INVASIVE CARCINOMA EXTENDS TO NEW LATERAL MARGIN. /RE-EXCISION OF BREAST LUMPECTOMY;: Byrnett, Forest Gleason, MD;  [facility] ORS; General;  Laterality: Right;   SENTINEL NODE BIOPSY Right 07/30/2017   Procedure: SENTINEL NODE BIOPSY;  Surgeon: Robert Bellow, MD;  Location: [facility] ORS;  Service: General;  Laterality: Right;   SIMPLE MASTECTOMY WITH AXILLARY SENTINEL NODE BIOPSY Right 09/28/2017   Procedure: SIMPLE MASTECTOMY;  Surgeon: Robert Bellow, MD;  Location: [facility] ORS;  Service: General;  Laterality: Right;    Family History  Problem Relation Age of Onset   Diabetes Father    Stroke Father    Hypertension Father    Hypertension Mother    Brain cancer Maternal Aunt 60   Diabetes Sister    Colon cancer Neg Hx    Breast cancer Neg Hx     Social History:  reports that she quit smoking about 5 years ago. Her smoking use included cigarettes. She has a 6.25 pack-year smoking history. She has quit using smokeless tobacco. She reports that she does not currently use alcohol. She reports that she does not use drugs.  Allergies:  Allergies  Allergen Reactions   Other Hives and Itching    [name]     Current Medications: Current Outpatient Medications  Medication Sig Dispense Refill   atorvastatin (LIPITOR) 10 MG tablet TAKE 1 TABLET BY MOUTH DAILY 100 tablet 2   baclofen (LIORESAL) 10 MG tablet Take 0.5-1 tablets (5-10 mg total) by mouth 2 (two) times daily as needed for muscle spasms. 30 each 1   furosemide (LASIX) 20 MG tablet Take 1 tablet (20 mg total) by mouth daily. 3 tablet 0   lisinopril (ZESTRIL) 5 MG tablet TAKE 1 TABLET BY MOUTH DAILY 100 tablet 1   metFORMIN (GLUCOPHAGE) 500 MG tablet TAKE 2 TABLETS BY MOUTH ONCE  DAILY WITH BREAKFAST AND 1  TABLET WITH  SUPPER 300 tablet 1   naproxen (NAPROSYN) 500 MG tablet Take 1 tablet (500 mg total) by mouth 2 (two) times daily with a meal. For 1-2 weeks then as needed 60 tablet 1   potassium chloride (KLOR-CON) 10 MEQ tablet TAKE 1 TABLET BY MOUTH TWICE  DAILY 180 tablet 3   triamterene-hydrochlorothiazide (MAXZIDE-25) 37.5-25 MG tablet TAKE 1 TABLET BY MOUTH DAILY 100 tablet 1   gabapentin (NEURONTIN) 400 MG capsule TAKE 1 CAPSULE BY MOUTH AT  BEDTIME (Patient not taking: Reported on 11/08/2022) 90 capsule 3   letrozole (FEMARA) 2.5 MG tablet Take 1 tablet (2.5 mg total) by mouth daily. 90 tablet 1   No current facility-administered medications for this visit.

## 2022-11-10 NOTE — Assessment & Plan Note (Signed)
Continue port flush 

## 2022-11-10 NOTE — Assessment & Plan Note (Signed)
Recommend patient to take oral iron supplenmentation.

## 2022-11-10 NOTE — Assessment & Plan Note (Signed)
Bone density on 05/04/2021 was normal. Recommend patient to take calcium '1200mg'$  and vitamin D supplementation.

## 2022-11-10 NOTE — Addendum Note (Signed)
Addended by: Earlie Server on: 11/10/2022 06:15 PM   Modules accepted: Orders

## 2022-11-10 NOTE — Assessment & Plan Note (Signed)
Check CT chest 

## 2022-11-14 ENCOUNTER — Ambulatory Visit
Admission: RE | Admit: 2022-11-14 | Discharge: 2022-11-14 | Disposition: A | Discharge status: Home / Self Care | Payer: Medicare Other | Source: Ambulatory Visit | Attending: Oncology | Admitting: Oncology

## 2022-11-14 ENCOUNTER — Telehealth: Payer: Self-pay

## 2022-11-14 DIAGNOSIS — C50411 Malignant neoplasm of upper-outer quadrant of right female breast: Secondary | ICD-10-CM | POA: Diagnosis not present

## 2022-11-14 DIAGNOSIS — Z170 Estrogen receptor positive status [ER+]: Secondary | ICD-10-CM | POA: Insufficient documentation

## 2022-11-14 DIAGNOSIS — R053 Chronic cough: Secondary | ICD-10-CM | POA: Diagnosis not present

## 2022-11-14 DIAGNOSIS — Z95828 Presence of other vascular implants and grafts: Secondary | ICD-10-CM | POA: Diagnosis not present

## 2022-11-14 DIAGNOSIS — D638 Anemia in other chronic diseases classified elsewhere: Secondary | ICD-10-CM | POA: Insufficient documentation

## 2022-11-14 DIAGNOSIS — R059 Cough, unspecified: Secondary | ICD-10-CM | POA: Diagnosis not present

## 2022-11-14 DIAGNOSIS — Z79811 Long term (current) use of aromatase inhibitors: Secondary | ICD-10-CM | POA: Diagnosis not present

## 2022-11-14 NOTE — Telephone Encounter (Signed)
-----   Message from Earlie Server, MD sent at 11/10/2022  6:14 PM EST ----- Please let patient know that I recommend her to stat oral iron supplementation. Rx of Vitron C was sent to pharmacy.  Also please arrange her to follow up in 6 months. Lab MD thanks.   zy

## 2022-11-14 NOTE — Telephone Encounter (Signed)
Spoke to pt and informed her of MD recommendation. Pt verbalized understanding.   Please schedule and inform pt of appts:   Labs in 6 months  MD 1-2 days AFTER labs

## 2022-12-21 ENCOUNTER — Ambulatory Visit: Payer: 59 | Admitting: Internal Medicine

## 2022-12-21 NOTE — Progress Notes (Deleted)
Subjective:    Patient ID: Chelsea Hudson, female    DOB: 1965-12-08, 57 y.o.   MRN: [id number]  HPI  Patient presents to clinic today for her annual exam.  Flu: Never Tetanus: >10 years ago COVID: Never Pneumovax: Never Shingrix: Never Pap smear: 11/2021 Mammogram: 01/2022 Colon screening: 06/2017 Vision screening: Dentist:  Diet: Exercise:  Review of Systems     Past Medical History:  Diagnosis Date   Arthritis    SHOULDER   Breast cancer ([place]) 02/2017   rt breast   Breast cancer ([place]) 2018   Cancer ([place]) 02/28/2017   INVASIVE MAMMARY CARCINOMA WITH MUCINOUS FEATURES.   Colonic diverticular abscess 06/21/2017   Colonoscopy 06/25/2017: No evidence of malignancy.   Diabetes mellitus without complication ([place])    Hypertension    Irregular heart beat    PT STATES IT "SKIPS A BEAT"    Obesity    Personal history of chemotherapy    Personal history of radiation therapy     Current Outpatient Medications  Medication Sig Dispense Refill   atorvastatin (LIPITOR) 10 MG tablet TAKE 1 TABLET BY MOUTH DAILY 100 tablet 2   baclofen (LIORESAL) 10 MG tablet Take 0.5-1 tablets (5-10 mg total) by mouth 2 (two) times daily as needed for muscle spasms. 30 each 1   furosemide (LASIX) 20 MG tablet Take 1 tablet (20 mg total) by mouth daily. 3 tablet 0   gabapentin (NEURONTIN) 400 MG capsule TAKE 1 CAPSULE BY MOUTH AT  BEDTIME (Patient not taking: Reported on 11/08/2022) 90 capsule 3   Iron-Vitamin C 65-125 MG TABS Take 1 tablet by mouth daily. 30 tablet 2   letrozole (FEMARA) 2.5 MG tablet Take 1 tablet (2.5 mg total) by mouth daily. 90 tablet 1   lisinopril (ZESTRIL) 5 MG tablet TAKE 1 TABLET BY MOUTH DAILY 100 tablet 1   metFORMIN (GLUCOPHAGE) 500 MG tablet TAKE 2 TABLETS BY MOUTH ONCE  DAILY WITH BREAKFAST AND 1  TABLET WITH SUPPER 300 tablet 1   naproxen (NAPROSYN) 500 MG tablet Take 1 tablet (500 mg total) by mouth 2 (two) times daily with a meal. For 1-2 weeks then as needed  60 tablet 1   potassium chloride (KLOR-CON) 10 MEQ tablet TAKE 1 TABLET BY MOUTH TWICE  DAILY 180 tablet 3   triamterene-hydrochlorothiazide (MAXZIDE-25) 37.5-25 MG tablet TAKE 1 TABLET BY MOUTH DAILY 100 tablet 1   No current facility-administered medications for this visit.    Allergies  Allergen Reactions   Other Hives and Itching    [name]     Family History  Problem Relation Age of Onset   Diabetes Father    Stroke Father    Hypertension Father    Hypertension Mother    Brain cancer Maternal Aunt 68   Diabetes Sister    Colon cancer Neg Hx    Breast cancer Neg Hx     Social History   Socioeconomic History   Marital status: Married    Spouse name: Not on file   Number of children: Not on file   Years of education: Not on file   Highest education level: Not on file  Occupational History   Not on file  Tobacco Use   Smoking status: Former    Packs/day: 0.25    Years: 25.00    Total pack years: 6.25    Types: Cigarettes  Quit date: 10/20/2017    Years since quitting: 5.1   Smokeless tobacco: Former  Scientific laboratory technician Use: Never used  Substance and Sexual Activity   Alcohol use: Not Currently    Comment: occas   Drug use: No   Sexual activity: Not Currently  Other Topics Concern   Not on file  Social History Narrative   Not on file   Social Determinants of Health   Financial Resource Strain: Low Risk  (03/06/2022)   Overall Financial Resource Strain (CARDIA)    Difficulty of Paying Living Expenses: Not hard at all  Food Insecurity: No Food Insecurity (03/06/2022)   Hunger Vital Sign    Worried About Running Out of Food in the Last Year: Never true    Ran Out of Food in the Last Year: Never true  Transportation Needs: No Transportation Needs (03/06/2022)   PRAPARE - Hydrologist (Medical): No    Lack of Transportation  (Non-Medical): No  Physical Activity: Sufficiently Active (03/06/2022)   Exercise Vital Sign    Days of Exercise per Week: 3 days    Minutes of Exercise per Session: 60 min  Stress: No Stress Concern Present (03/06/2022)   Bates    Feeling of Stress : Not at all  Social Connections: [place] (03/06/2022)   Social Connection and Isolation Panel [NHANES]    Frequency of Communication with Friends and Family: More than three times a week    Frequency of Social Gatherings with Friends and Family: Twice a week    Attends Religious Services: More than 4 times per year    Active Member of [employer] or Organizations: Yes    Attends Music therapist: More than 4 times per year    Marital Status: Married  Human resources officer Violence: Not on file     Constitutional: Denies fever, malaise, fatigue, headache or abrupt weight changes.  HEENT: Denies eye pain, eye redness, ear pain, ringing in the ears, wax buildup, runny nose, nasal congestion, bloody nose, or sore throat. Respiratory: Denies difficulty breathing, shortness of breath, cough or sputum production.   Cardiovascular: Denies chest pain, chest tightness, palpitations or swelling in the hands or feet.  Gastrointestinal: Denies abdominal pain, bloating, constipation, diarrhea or blood in the stool.  GU: Denies urgency, frequency, pain with urination, burning sensation, blood in urine, odor or discharge. Musculoskeletal: Patient reports joint pain.  Denies decrease in range of motion, difficulty with gait, muscle pain or joint swelling.  Skin: Denies redness, rashes, lesions or ulcercations.  Neurological: Patient reports neuropathy.  Denies dizziness, difficulty with memory, difficulty with speech or problems with balance and coordination.  Psych: Denies anxiety, depression, SI/HI.  No other specific complaints in a complete review of systems (except as  listed in HPI above).  Objective:   Physical Exam   LMP 01/31/2014 (Approximate) Comment: LMP was 3 years ago. Wt Readings from Last 3 Encounters:  11/08/22 248 lb (112.5 kg)  06/20/22 241 lb (109.3 kg)  05/09/22 244 lb 4.8 oz (110.8 kg)    General: Appears their stated age, well developed, well nourished in NAD. Skin: Warm, dry and intact. No rashes, lesions or ulcerations noted. HEENT: Head: normal shape and size; Eyes: sclera white, no icterus, conjunctiva pink, PERRLA and EOMs intact; Ears: Tm's gray and intact, normal light reflex; Nose: mucosa pink and moist, septum midline; Throat/Mouth: Teeth present, mucosa pink and moist, no exudate, lesions  or ulcerations noted.  Neck:  Neck supple, trachea midline. No masses, lumps or thyromegaly present.  Cardiovascular: Normal rate and rhythm. S1,S2 noted.  No murmur, rubs or gallops noted. No JVD or BLE edema. No carotid bruits noted. Pulmonary/Chest: Normal effort and positive vesicular breath sounds. No respiratory distress. No wheezes, rales or ronchi noted.  Abdomen: Soft and nontender. Normal bowel sounds. No distention or masses noted. Liver, spleen and kidneys non palpable. Musculoskeletal: Normal range of motion. No signs of joint swelling. No difficulty with gait.  Neurological: Alert and oriented. Cranial nerves II-XII grossly intact. Coordination normal.  Psychiatric: Mood and affect normal. Behavior is normal. Judgment and thought content normal.    BMET    Component Value Date/Time   NA 136 11/08/2022 1400   K 3.5 11/08/2022 1400   CL 104 11/08/2022 1400   CO2 24 11/08/2022 1400   GLUCOSE 276 (H) 11/08/2022 1400   BUN 23 (H) 11/08/2022 1400   CREATININE 1.24 (H) 11/08/2022 1400   CREATININE 1.19 (H) 06/20/2022 1550   CALCIUM 8.9 11/08/2022 1400   GFRNONAA 52 (L) 11/08/2022 1400   GFRNONAA 77 03/22/2020 0802   GFRAA 89 03/22/2020 0802    Lipid Panel     Component Value Date/Time   CHOL 117 06/20/2022 1550    CHOL 168 03/01/2021 1649   TRIG 119 06/20/2022 1550   HDL 32 (L) 06/20/2022 1550   HDL 33 (L) 03/01/2021 1649   CHOLHDL 3.7 06/20/2022 1550   VLDL 23 04/04/2017 0941   LDLCALC 64 06/20/2022 1550    CBC    Component Value Date/Time   WBC 7.2 11/08/2022 1400   RBC 4.20 11/08/2022 1400   RBC 4.16 11/08/2022 1400   HGB 10.6 (L) 11/08/2022 1400   HCT 33.8 (L) 11/08/2022 1400   PLT 332 11/08/2022 1400   MCV 80.5 11/08/2022 1400   MCH 25.2 (L) 11/08/2022 1400   MCHC 31.4 11/08/2022 1400   RDW 16.7 (H) 11/08/2022 1400   LYMPHSABS 1.3 11/08/2022 1400   MONOABS 0.3 11/08/2022 1400   EOSABS 0.2 11/08/2022 1400   BASOSABS 0.0 11/08/2022 1400    Hgb A1C Lab Results  Component Value Date   HGBA1C 6.8 (A) 06/20/2022           Assessment & Plan:   Preventative Health Maintenance:  She declines flu shot She declines tetanus booster Encouraged her to get her COVID-vaccine She declines Pneumovax Discussed Shingrix vaccine, she will check coverage with her insurance company and schedule a visit if she would like to have this done Pap smear UTD Mammogram scheduled Colon screening UTD Encouraged her to consume a balanced diet and exercise regimen Advised her seeing eye doctor and dentist annually We will check CBC, c-Met, lipid, A1c and urine microalbumin today  RTC in 6 months, follow-up chronic edition Webb Silversmith, NP

## 2022-12-26 ENCOUNTER — Ambulatory Visit (INDEPENDENT_AMBULATORY_CARE_PROVIDER_SITE_OTHER): Payer: 59 | Admitting: Internal Medicine

## 2022-12-26 ENCOUNTER — Encounter: Payer: Self-pay | Admitting: Internal Medicine

## 2022-12-26 VITALS — BP 118/64 | HR 107 | Temp 96.8°F | Ht 66.0 in | Wt 246.0 lb

## 2022-12-26 DIAGNOSIS — Z0001 Encounter for general adult medical examination with abnormal findings: Secondary | ICD-10-CM

## 2022-12-26 DIAGNOSIS — Z6839 Body mass index (BMI) 39.0-39.9, adult: Secondary | ICD-10-CM | POA: Diagnosis not present

## 2022-12-26 DIAGNOSIS — E1165 Type 2 diabetes mellitus with hyperglycemia: Secondary | ICD-10-CM | POA: Diagnosis not present

## 2022-12-26 NOTE — Progress Notes (Signed)
Subjective:    Patient ID: Chelsea Hudson, female    DOB: October 17, 1966, 57 y.o.   MRN: [id number]  HPI  Patient presents to clinic today for her annual exam.  Flu: Never Tetanus: >10 years ago COVID: Never Pneumovax:: Never Shingrix: Never Pap smear: 11/2021 Mammogram: 01/2022, scheduled 01/2023 Colon screening: 06/2017 Vision screening: annually Dentist: biannually  Diet: She does eat meat. She consumes fruits and veggies. She does eat fried foods. She drinks mostly water, tea. Exercise: None   Review of Systems     Past Medical History:  Diagnosis Date   Arthritis    SHOULDER   Breast cancer ([place]) 02/2017   rt breast   Breast cancer ([place]) 2018   Cancer ([place]) 02/28/2017   INVASIVE MAMMARY CARCINOMA WITH MUCINOUS FEATURES.   Colonic diverticular abscess 06/21/2017   Colonoscopy 06/25/2017: No evidence of malignancy.   Diabetes mellitus without complication ([place])    Hypertension    Irregular heart beat    PT STATES IT "SKIPS A BEAT"    Obesity    Personal history of chemotherapy    Personal history of radiation therapy     Current Outpatient Medications  Medication Sig Dispense Refill   atorvastatin (LIPITOR) 10 MG tablet TAKE 1 TABLET BY MOUTH DAILY 100 tablet 2   baclofen (LIORESAL) 10 MG tablet Take 0.5-1 tablets (5-10 mg total) by mouth 2 (two) times daily as needed for muscle spasms. 30 each 1   furosemide (LASIX) 20 MG tablet Take 1 tablet (20 mg total) by mouth daily. 3 tablet 0   gabapentin (NEURONTIN) 400 MG capsule TAKE 1 CAPSULE BY MOUTH AT  BEDTIME (Patient not taking: Reported on 11/08/2022) 90 capsule 3   Iron-Vitamin C 65-125 MG TABS Take 1 tablet by mouth daily. 30 tablet 2   letrozole (FEMARA) 2.5 MG tablet Take 1 tablet (2.5 mg total) by mouth daily. 90 tablet 1   lisinopril (ZESTRIL) 5 MG tablet TAKE 1 TABLET BY MOUTH DAILY 100 tablet 1   metFORMIN (GLUCOPHAGE) 500 MG tablet TAKE 2 TABLETS BY MOUTH ONCE  DAILY WITH BREAKFAST AND 1  TABLET WITH  SUPPER 300 tablet 1   naproxen (NAPROSYN) 500 MG tablet Take 1 tablet (500 mg total) by mouth 2 (two) times daily with a meal. For 1-2 weeks then as needed 60 tablet 1   potassium chloride (KLOR-CON) 10 MEQ tablet TAKE 1 TABLET BY MOUTH TWICE  DAILY 180 tablet 3   triamterene-hydrochlorothiazide (MAXZIDE-25) 37.5-25 MG tablet TAKE 1 TABLET BY MOUTH DAILY 100 tablet 1   No current facility-administered medications for this visit.    Allergies  Allergen Reactions   Other Hives and Itching    [name]     Family History  Problem Relation Age of Onset   Diabetes Father    Stroke Father    Hypertension Father    Hypertension Mother    Brain cancer Maternal Aunt 16   Diabetes Sister    Colon cancer Neg Hx    Breast cancer Neg Hx     Social History   Socioeconomic History   Marital status: Married    Spouse name: Not on file   Number of children: Not on file   Years of education: Not on file   Highest education level: Not on file  Occupational History   Not on file  Tobacco Use   Smoking  status: Former    Packs/day: 0.25    Years: 25.00    Total pack years: 6.25    Types: Cigarettes    Quit date: 10/20/2017    Years since quitting: 5.1   Smokeless tobacco: Former  Scientific laboratory technician Use: Never used  Substance and Sexual Activity   Alcohol use: Not Currently    Comment: occas   Drug use: No   Sexual activity: Not Currently  Other Topics Concern   Not on file  Social History Narrative   Not on file   Social Determinants of Health   Financial Resource Strain: Low Risk  (03/06/2022)   Overall Financial Resource Strain (CARDIA)    Difficulty of Paying Living Expenses: Not hard at all  Food Insecurity: No Food Insecurity (03/06/2022)   Hunger Vital Sign    Worried About Running Out of Food in the Last Year: Never true    Ran Out of Food in the Last Year: Never true   Transportation Needs: No Transportation Needs (03/06/2022)   PRAPARE - Hydrologist (Medical): No    Lack of Transportation (Non-Medical): No  Physical Activity: Sufficiently Active (03/06/2022)   Exercise Vital Sign    Days of Exercise per Week: 3 days    Minutes of Exercise per Session: 60 min  Stress: No Stress Concern Present (03/06/2022)   [place]    Feeling of Stress : Not at all  Social Connections: [place] (03/06/2022)   Social Connection and Isolation Panel [NHANES]    Frequency of Communication with Friends and Family: More than three times a week    Frequency of Social Gatherings with Friends and Family: Twice a week    Attends Religious Services: More than 4 times per year    Active Member of [employer] or Organizations: Yes    Attends Music therapist: More than 4 times per year    Marital Status: Married  Human resources officer Violence: Not on file     Constitutional: Denies fever, malaise, fatigue, headache or abrupt weight changes.  HEENT: Denies eye pain, eye redness, ear pain, ringing in the ears, wax buildup, runny nose, nasal congestion, bloody nose, or sore throat. Respiratory: Pt reports intermittent cough. Denies difficulty breathing, shortness of breath, or sputum production.   Cardiovascular: Denies chest pain, chest tightness, palpitations or swelling in the hands or feet.  Gastrointestinal: Denies abdominal pain, bloating, constipation, diarrhea or blood in the stool.  GU: Denies urgency, frequency, pain with urination, burning sensation, blood in urine, odor or discharge. Musculoskeletal: Patient reports intermittent joint pain, muscles cramps in legs.  Denies decrease in range of motion, difficulty with gait, muscle pain or joint swelling.  Skin: Denies redness, rashes, lesions or ulcercations.  Neurological: Patient reports neuropathic pain.   Denies dizziness, difficulty with memory, difficulty with speech or problems with balance and coordination.  Psych: Denies anxiety, depression, SI/HI.  No other specific complaints in a complete review of systems (except as listed in HPI above).  Objective:   Physical Exam  BP 118/64 (BP Location: Left Arm, Patient Position: Sitting, Cuff Size: Large)   Pulse (!) 107   Temp (!) 96.8 F (36 C) (Temporal)   Ht '5\' 6"'$  (1.676 m)   Wt 246 lb (111.6 kg)   LMP 01/31/2014 (Approximate) Comment: LMP was 3 years ago.  SpO2 99%   BMI 39.71 kg/m   Wt Readings from Last 3 Encounters:  11/08/22 248 lb (112.5 kg)  06/20/22 241 lb (109.3 kg)  05/09/22 244 lb 4.8 oz (110.8 kg)    General: Appears her stated age,obese, in NAD. Skin: Warm, dry and intact. No ulcerations noted. HEENT: Head: normal shape and size; Eyes: sclera white, no icterus, conjunctiva pink, PERRLA and EOMs intact;  Neck:  Neck supple, trachea midline. No masses, lumps or thyromegaly present.  Cardiovascular: Normal rate and rhythm. S1,S2 noted.  No murmur, rubs or gallops noted. Trace BLE edema. No carotid bruits noted. Pulmonary/Chest: Normal effort and positive vesicular breath sounds. No respiratory distress. No wheezes, rales or ronchi noted.  Abdomen: Normal bowel sounds.  Musculoskeletal: Strength 5/5 BUE/BLE. No difficulty with gait.  Neurological: Alert and oriented. Cranial nerves II-XII grossly intact. Coordination normal.  Psychiatric: Mood and affect normal. Behavior is normal. Judgment and thought content normal.     BMET    Component Value Date/Time   NA 136 11/08/2022 1400   K 3.5 11/08/2022 1400   CL 104 11/08/2022 1400   CO2 24 11/08/2022 1400   GLUCOSE 276 (H) 11/08/2022 1400   BUN 23 (H) 11/08/2022 1400   CREATININE 1.24 (H) 11/08/2022 1400   CREATININE 1.19 (H) 06/20/2022 1550   CALCIUM 8.9 11/08/2022 1400   GFRNONAA 52 (L) 11/08/2022 1400   GFRNONAA 77 03/22/2020 0802   GFRAA 89 03/22/2020  0802    Lipid Panel     Component Value Date/Time   CHOL 117 06/20/2022 1550   CHOL 168 03/01/2021 1649   TRIG 119 06/20/2022 1550   HDL 32 (L) 06/20/2022 1550   HDL 33 (L) 03/01/2021 1649   CHOLHDL 3.7 06/20/2022 1550   VLDL 23 04/04/2017 0941   LDLCALC 64 06/20/2022 1550    CBC    Component Value Date/Time   WBC 7.2 11/08/2022 1400   RBC 4.20 11/08/2022 1400   RBC 4.16 11/08/2022 1400   HGB 10.6 (L) 11/08/2022 1400   HCT 33.8 (L) 11/08/2022 1400   PLT 332 11/08/2022 1400   MCV 80.5 11/08/2022 1400   MCH 25.2 (L) 11/08/2022 1400   MCHC 31.4 11/08/2022 1400   RDW 16.7 (H) 11/08/2022 1400   LYMPHSABS 1.3 11/08/2022 1400   MONOABS 0.3 11/08/2022 1400   EOSABS 0.2 11/08/2022 1400   BASOSABS 0.0 11/08/2022 1400    Hgb A1C Lab Results  Component Value Date   HGBA1C 6.8 (A) 06/20/2022           Assessment & Plan:   Preventative Health Maintenance:  She declines flu shot She declines tetanus booster Encouraged her to get her COVID-vaccine She declines Pneumovax Discussed Shingrix vaccine, will check coverage with insurance company and schedule a visit if she would like to have this done Pap smear UTD Mammogram scheduled Colon screening UTD Encouraged her to consume a balanced diet and exercise regimen Advised her to see an eye doctor and dentist annually Will check CBC, c-Met, lipid, A1c and urine microalbumin today  RTC in 6 months, follow-up chronic conditions Webb Silversmith, NP

## 2022-12-26 NOTE — Assessment & Plan Note (Signed)
Encourage diet and exercise for weight loss 

## 2022-12-26 NOTE — Patient Instructions (Signed)

## 2022-12-27 LAB — COMPLETE METABOLIC PANEL WITH GFR
AG Ratio: 1.1 (calc) (ref 1.0–2.5)
ALT: 24 U/L (ref 6–29)
AST: 16 U/L (ref 10–35)
Albumin: 3.5 g/dL — ABNORMAL LOW (ref 3.6–5.1)
Alkaline phosphatase (APISO): 100 U/L (ref 37–153)
BUN: 18 mg/dL (ref 7–25)
CO2: 27 mmol/L (ref 20–32)
Calcium: 9.2 mg/dL (ref 8.6–10.4)
Chloride: 103 mmol/L (ref 98–110)
Creat: 0.81 mg/dL (ref 0.50–1.03)
Globulin: 3.2 g/dL (calc) (ref 1.9–3.7)
Glucose, Bld: 311 mg/dL — ABNORMAL HIGH (ref 65–99)
Potassium: 3.9 mmol/L (ref 3.5–5.3)
Sodium: 137 mmol/L (ref 135–146)
Total Bilirubin: 0.2 mg/dL (ref 0.2–1.2)
Total Protein: 6.7 g/dL (ref 6.1–8.1)
eGFR: 86 mL/min/{1.73_m2} (ref >=60)

## 2022-12-27 LAB — LIPID PANEL
Cholesterol: 97 mg/dL (ref <200)
HDL: 32 mg/dL — ABNORMAL LOW (ref >=50)
LDL Cholesterol (Calc): 48 mg/dL (calc)
Non-HDL Cholesterol (Calc): 65 mg/dL (calc) (ref <130)
Total CHOL/HDL Ratio: 3 (calc) (ref <5.0)
Triglycerides: 86 mg/dL (ref <150)

## 2022-12-27 LAB — MICROALBUMIN / CREATININE URINE RATIO
Creatinine, Urine: 139 mg/dL (ref 20–275)
Microalb Creat Ratio: 28 mcg/mg creat (ref <30)
Microalb, Ur: 3.9 mg/dL

## 2022-12-27 LAB — HEMOGLOBIN A1C
Hgb A1c MFr Bld: 8.2 % of total Hgb — ABNORMAL HIGH (ref <5.7)
Mean Plasma Glucose: 189 mg/dL
eAG (mmol/L): 10.4 mmol/L

## 2022-12-27 LAB — CBC
HCT: 33.1 % — ABNORMAL LOW (ref 35.0–45.0)
Hemoglobin: 10.5 g/dL — ABNORMAL LOW (ref 11.7–15.5)
MCH: 24.9 pg — ABNORMAL LOW (ref 27.0–33.0)
MCHC: 31.7 g/dL — ABNORMAL LOW (ref 32.0–36.0)
MCV: 78.4 fL — ABNORMAL LOW (ref 80.0–100.0)
MPV: 9.6 fL (ref 7.5–12.5)
Platelets: 414 10*3/uL — ABNORMAL HIGH (ref 140–400)
RBC: 4.22 10*6/uL (ref 3.80–5.10)
RDW: 15.8 % — ABNORMAL HIGH (ref 11.0–15.0)
WBC: 8.2 10*3/uL (ref 3.8–10.8)

## 2022-12-29 ENCOUNTER — Other Ambulatory Visit: Payer: Self-pay | Admitting: Internal Medicine

## 2022-12-29 DIAGNOSIS — E1169 Type 2 diabetes mellitus with other specified complication: Secondary | ICD-10-CM

## 2023-01-01 NOTE — Telephone Encounter (Signed)
Requested Prescriptions  Pending Prescriptions Disp Refills   atorvastatin (LIPITOR) 10 MG tablet [Pharmacy Med Name: Atorvastatin Calcium 10 MG Oral Tablet] 100 tablet 0    Sig: TAKE 1 TABLET BY MOUTH DAILY     Cardiovascular:  Antilipid - Statins Failed - 12/29/2022 10:38 PM      Failed - Lipid Panel in normal range within the last 12 months    Cholesterol, Total  Date Value Ref Range Status  03/01/2021 168 100 - 199 mg/dL Final   Cholesterol  Date Value Ref Range Status  12/26/2022 97 <200 mg/dL Final   LDL Cholesterol (Calc)  Date Value Ref Range Status  12/26/2022 48 mg/dL (calc) Final    Comment:    Reference range: <100 . Desirable range <100 mg/dL for primary prevention;   <70 mg/dL for patients with CHD or diabetic patients  with > or = 2 CHD risk factors. Marland Kitchen LDL-C is now calculated using the Martin-Hopkins  calculation, which is a validated novel method providing  better accuracy than the Friedewald equation in the  estimation of LDL-C.  Cresenciano Genre et al. Annamaria Helling. [id number]): 2061-2068  (http://education.[facility].com/faq/FAQ164)    HDL  Date Value Ref Range Status  12/26/2022 32 (L) > OR = 50 mg/dL Final  03/01/2021 33 (L) >39 mg/dL Final   Triglycerides  Date Value Ref Range Status  12/26/2022 86 <150 mg/dL Final         Passed - Patient is not pregnant      Passed - Valid encounter within last 12 months    Recent Outpatient Visits           6 days ago Encounter for general adult medical examination with abnormal findings   [facility] [place], [place] W, NP   6 months ago Controlled type 2 diabetes mellitus with hyperglycemia, without long-term current use of insulin [facility])   [facility] [place], Coralie Keens, NP   8 months ago Acute pain of right shoulder   [place], DO   1 year ago Encounter for general adult medical examination with abnormal  findings   [facility] [place], Coralie Keens, NP   1 year ago Controlled type 2 diabetes mellitus with hyperglycemia, without long-term current use of insulin [facility])   [facility] [place], Coralie Keens, [place]

## 2023-01-03 ENCOUNTER — Inpatient Hospital Stay: Payer: 59 | Attending: Oncology

## 2023-01-19 ENCOUNTER — Other Ambulatory Visit: Payer: Self-pay | Admitting: Internal Medicine

## 2023-01-19 DIAGNOSIS — E1165 Type 2 diabetes mellitus with hyperglycemia: Secondary | ICD-10-CM

## 2023-01-22 ENCOUNTER — Ambulatory Visit
Admission: RE | Admit: 2023-01-22 | Discharge: 2023-01-22 | Disposition: A | Discharge status: Home / Self Care | Payer: 59 | Source: Ambulatory Visit | Attending: Oncology | Admitting: Oncology

## 2023-01-22 DIAGNOSIS — R059 Cough, unspecified: Secondary | ICD-10-CM | POA: Diagnosis not present

## 2023-01-22 DIAGNOSIS — Z95828 Presence of other vascular implants and grafts: Secondary | ICD-10-CM | POA: Diagnosis not present

## 2023-01-22 DIAGNOSIS — R053 Chronic cough: Secondary | ICD-10-CM | POA: Insufficient documentation

## 2023-01-22 DIAGNOSIS — Z170 Estrogen receptor positive status [ER+]: Secondary | ICD-10-CM | POA: Diagnosis not present

## 2023-01-22 DIAGNOSIS — C50411 Malignant neoplasm of upper-outer quadrant of right female breast: Secondary | ICD-10-CM | POA: Insufficient documentation

## 2023-01-22 DIAGNOSIS — Z1231 Encounter for screening mammogram for malignant neoplasm of breast: Secondary | ICD-10-CM | POA: Insufficient documentation

## 2023-01-22 DIAGNOSIS — Z79811 Long term (current) use of aromatase inhibitors: Secondary | ICD-10-CM | POA: Insufficient documentation

## 2023-01-22 DIAGNOSIS — D638 Anemia in other chronic diseases classified elsewhere: Secondary | ICD-10-CM | POA: Insufficient documentation

## 2023-01-22 NOTE — Telephone Encounter (Signed)
Unable to refill per protocol, Rx request is too soon. Last refill 07/18/22 for 100 and 1 refill.  Requested Prescriptions  Pending Prescriptions Disp Refills   metFORMIN (GLUCOPHAGE) 500 MG tablet [Pharmacy Med Name: metFORMIN HCl 500 MG Oral Tablet] 300 tablet 2    Sig: TAKE 2 TABLETS BY MOUTH WITH  BREAKFAST AND 1 TABLET BY MOUTH  WITH SUPPER     Endocrinology:  Diabetes - Biguanides Failed - 01/19/2023 10:15 PM      Failed - HBA1C is between 0 and 7.9 and within 180 days    Hgb A1c MFr Bld  Date Value Ref Range Status  12/26/2022 8.2 (H) <5.7 % of total Hgb Final    Comment:    For someone without known diabetes, a hemoglobin A1c value of 6.5% or greater indicates that they may have  diabetes and this should be confirmed with a follow-up  test. . For someone with known diabetes, a value <7% indicates  that their diabetes is well controlled and a value  greater than or equal to 7% indicates suboptimal  control. A1c targets should be individualized based on  duration of diabetes, age, comorbid conditions, and  other considerations. . Currently, no consensus exists regarding use of hemoglobin A1c for diagnosis of diabetes for children. .          Passed - Cr in normal range and within 360 days    Creat  Date Value Ref Range Status  12/26/2022 0.81 0.50 - 1.03 mg/dL Final   Creatinine, Urine  Date Value Ref Range Status  12/26/2022 139 20 - 275 mg/dL Final         Passed - eGFR in normal range and within 360 days    GFR, Est African American  Date Value Ref Range Status  03/22/2020 89 > OR = 60 mL/min/1.68m Final   GFR, Est Non African American  Date Value Ref Range Status  03/22/2020 77 > OR = 60 mL/min/1.768mFinal   GFR, Estimated  Date Value Ref Range Status  11/08/2022 52 (L) >60 mL/min Final    Comment:    (NOTE) Calculated using the CKD-EPI Creatinine Equation (2021)    eGFR  Date Value Ref Range Status  12/26/2022 86 > OR = 60 mL/min/1.734minal          Passed - B12 Level in normal range and within 720 days    Vitamin B-12  Date Value Ref Range Status  05/03/2021 237 180 - 914 pg/mL Final    Comment:    (NOTE) This assay is not validated for testing neonatal or myeloproliferative syndrome specimens for Vitamin B12 levels. Performed at [id number] [id number]       Passed - Valid encounter within last 6 months    Recent Outpatient Visits           3 weeks ago Encounter for general adult medical examination with abnormal findings   [facility]   7 months ago Controlled type 2 diabetes mellitus with hyperglycemia, without long-term current use of insulin [facility]   8 months ago Acute pain of right shoulder   [facility]   1 year ago Encounter for general adult medical examination with abnormal findings   [facility]   1 year ago Controlled type 2 diabetes mellitus with hyperglycemia, without long-term current use of insulin ([place])   [facility] [place], [place], NP              Passed - CBC within normal limits and completed in the last 12 months    WBC  Date Value Ref Range Status  12/26/2022 8.2 3.8 - 10.8 Thousand/uL Final   RBC  Date Value Ref Range Status  12/26/2022 4.22 3.80 - 5.10 Million/uL Final   Hemoglobin  Date Value Ref Range Status  12/26/2022 10.5 (L) 11.7 - 15.5 g/dL Final   HCT  Date Value Ref Range Status  12/26/2022 33.1 (L) 35.0 - 45.0 % Final   MCHC  Date Value Ref Range Status  12/26/2022 31.7 (L) 32.0 - 36.0 g/dL Final   [facility]  Date Value Ref Range Status  12/26/2022 24.9 (L) 27.0 - 33.0 pg Final   MCV  Date Value Ref Range Status  12/26/2022 78.4 (L) 80.0 - 100.0 fL Final   No results found for:  "PLTCOUNTKUC", "LABPLAT", "POCPLA" RDW  Date Value Ref Range Status  12/26/2022 15.8 (H) 11.0 - 15.0 % Final          lisinopril (ZESTRIL) 5 MG tablet [Pharmacy Med Name: Lisinopril 5 MG Oral Tablet] 100 tablet 2    Sig: TAKE 1 TABLET BY MOUTH DAILY     Cardiovascular:  ACE Inhibitors Passed - 01/19/2023 10:15 PM      Passed - Cr in normal range and within 180 days    Creat  Date Value Ref Range Status  12/26/2022 0.81 0.50 - 1.03 mg/dL Final   Creatinine, Urine  Date Value Ref Range Status  12/26/2022 139 20 - 275 mg/dL Final         Passed - K in normal range and within 180 days    Potassium  Date Value Ref Range Status  12/26/2022 3.9 3.5 - 5.3 mmol/L Final         Passed - Patient is not pregnant      Passed - Last BP in normal range    BP Readings from Last 1 Encounters:  12/26/22 118/64         Passed - Valid encounter within last 6 months    Recent Outpatient Visits           3 weeks ago Encounter for general adult medical examination with abnormal findings   [facility] [place], Coralie Keens, NP   7 months ago Controlled type 2 diabetes mellitus with hyperglycemia, without long-term current use of insulin [facility])   [facility] [place], Coralie Keens, NP   8 months ago Acute pain of right shoulder   [place], Alexander J, DO   1 year ago Encounter for general adult medical examination with abnormal findings   [facility] [place], Coralie Keens, NP   1 year ago Controlled type 2 diabetes mellitus with hyperglycemia, without long-term current use of insulin [facility])   [facility] [place], Coralie Keens, [place]

## 2023-01-23 ENCOUNTER — Other Ambulatory Visit: Payer: Self-pay | Admitting: Oncology

## 2023-01-23 DIAGNOSIS — Z79811 Long term (current) use of aromatase inhibitors: Secondary | ICD-10-CM

## 2023-01-23 DIAGNOSIS — Z170 Estrogen receptor positive status [ER+]: Secondary | ICD-10-CM

## 2023-01-23 DIAGNOSIS — R053 Chronic cough: Secondary | ICD-10-CM

## 2023-01-23 DIAGNOSIS — R059 Cough, unspecified: Secondary | ICD-10-CM

## 2023-01-23 DIAGNOSIS — D638 Anemia in other chronic diseases classified elsewhere: Secondary | ICD-10-CM

## 2023-01-23 DIAGNOSIS — Z95828 Presence of other vascular implants and grafts: Secondary | ICD-10-CM

## 2023-02-06 ENCOUNTER — Telehealth: Payer: Self-pay | Admitting: Internal Medicine

## 2023-02-06 NOTE — Telephone Encounter (Signed)
Called patient to schedule Medicare Annual Wellness Visit (AWV). Left message for patient to call back and schedule Medicare Annual Wellness Visit (AWV).  Last date of AWV: 03/06/22  Please schedule an appointment at any time with Kirke Shaggy, LPN   If any questions, please contact me.  Thank you ,  Sherol Dade; [place] Direct Dial: [phone]

## 2023-02-28 ENCOUNTER — Inpatient Hospital Stay: Payer: 59 | Attending: Oncology

## 2023-03-06 ENCOUNTER — Other Ambulatory Visit: Payer: Self-pay | Admitting: Internal Medicine

## 2023-03-06 DIAGNOSIS — E876 Hypokalemia: Secondary | ICD-10-CM

## 2023-03-06 DIAGNOSIS — G620 Drug-induced polyneuropathy: Secondary | ICD-10-CM

## 2023-03-07 ENCOUNTER — Other Ambulatory Visit: Payer: Self-pay | Admitting: Internal Medicine

## 2023-03-07 DIAGNOSIS — E1169 Type 2 diabetes mellitus with other specified complication: Secondary | ICD-10-CM

## 2023-03-07 DIAGNOSIS — G620 Drug-induced polyneuropathy: Secondary | ICD-10-CM

## 2023-03-07 NOTE — Telephone Encounter (Signed)
Requested Prescriptions  Pending Prescriptions Disp Refills   potassium chloride (KLOR-CON) 10 MEQ tablet [Pharmacy Med Name: Potassium Chloride ER 10 MEQ Oral Tablet Extended Release] 180 tablet 1    Sig: TAKE 1 TABLET BY MOUTH TWICE  DAILY     Endocrinology:  Minerals - Potassium Supplementation Passed - 03/06/2023 10:15 PM      Passed - K in normal range and within 360 days    Potassium  Date Value Ref Range Status  12/26/2022 3.9 3.5 - 5.3 mmol/L Final         Passed - Cr in normal range and within 360 days    Creat  Date Value Ref Range Status  12/26/2022 0.81 0.50 - 1.03 mg/dL Final   Creatinine, Urine  Date Value Ref Range Status  12/26/2022 139 20 - 275 mg/dL Final         Passed - Valid encounter within last 12 months    Recent Outpatient Visits           2 months ago Encounter for general adult medical examination with abnormal findings   [facility] [place], Salvadore [place], NP   8 months ago Controlled type 2 diabetes mellitus with hyperglycemia, without long-term current use of insulin [facility])   [place] [facility] [place], Salvadore [place], NP   10 months ago Acute pain of right shoulder   [place] [facility] [place], DO   1 year ago Encounter for general adult medical examination with abnormal findings   [place] [facility] [place], Salvadore [place], NP   1 year ago Controlled type 2 diabetes mellitus with hyperglycemia, without long-term current use of insulin [facility])   [place] [facility] [place], [place] W, NP               gabapentin (NEURONTIN) 400 MG capsule [Pharmacy Med Name: Gabapentin 400 MG Oral Capsule] 100 capsule 2    Sig: TAKE 1 CAPSULE BY MOUTH AT  BEDTIME     Neurology: Anticonvulsants - gabapentin Passed - 03/06/2023 10:15 PM      Passed - Cr in normal range and within 360 days    Creat  Date Value Ref Range Status  12/26/2022 0.81 0.50 - 1.03  mg/dL Final   Creatinine, Urine  Date Value Ref Range Status  12/26/2022 139 20 - 275 mg/dL Final         Passed - Completed PHQ-2 or PHQ-9 in the last 360 days      Passed - Valid encounter within last 12 months    Recent Outpatient Visits           2 months ago Encounter for general adult medical examination with abnormal findings   [facility] [place], [place] W, NP   8 months ago Controlled type 2 diabetes mellitus with hyperglycemia, without long-term current use of insulin [facility])   [place] [facility] [place], [place], NP   10 months ago Acute pain of right shoulder   [facility] [place], [place]ta Neat, DO   1 year ago Encounter for general adult medical examination with abnormal findings   [place] [facility] [place], [place] W, NP   1 year ago Controlled type 2 diabetes mellitus with hyperglycemia, without long-term current use of insulin [facility])    [facility] [place], Salvadore [place], [place]

## 2023-03-08 NOTE — Telephone Encounter (Signed)
Rx was dc'd 12/26/22 by provider  Requested Prescriptions  Pending Prescriptions Disp Refills   gabapentin (NEURONTIN) 400 MG capsule [Pharmacy Med Name: Gabapentin 400 MG Oral Capsule] 100 capsule 2    Sig: TAKE 1 CAPSULE BY MOUTH AT  BEDTIME     Neurology: Anticonvulsants - gabapentin Passed - 03/07/2023 10:18 PM      Passed - Cr in normal range and within 360 days    Creat  Date Value Ref Range Status  12/26/2022 0.81 0.50 - 1.03 mg/dL Final   Creatinine, Urine  Date Value Ref Range Status  12/26/2022 139 20 - 275 mg/dL Final         Passed - Completed PHQ-2 or PHQ-9 in the last 360 days      Passed - Valid encounter within last 12 months    Recent Outpatient Visits           2 months ago Encounter for general adult medical examination with abnormal findings   [place] [facility] [place], [place] W, NP   8 months ago Controlled type 2 diabetes mellitus with hyperglycemia, without long-term current use of insulin [facility])   [place] [facility] [place], Salvadore [place], NP   10 months ago Acute pain of right shoulder   [place] [facility] Smitty Cords, DO   1 year ago Encounter for general adult medical examination with abnormal findings   [place] [facility] [place], [place] W, NP   1 year ago Controlled type 2 diabetes mellitus with hyperglycemia, without long-term current use of insulin [facility])   [place] [facility] [place], [place], NP              Signed Prescriptions Disp Refills   atorvastatin (LIPITOR) 10 MG tablet 100 tablet 2    Sig: TAKE 1 TABLET BY MOUTH DAILY     Cardiovascular:  Antilipid - Statins Failed - 03/07/2023 10:18 PM      Failed - Lipid Panel in normal range within the last 12 months    Cholesterol, Total  Date Value Ref Range Status  03/01/2021 168 100 - 199 mg/dL Final   Cholesterol  Date Value Ref Range Status  12/26/2022 97 <200 mg/dL Final   LDL  Cholesterol (Calc)  Date Value Ref Range Status  12/26/2022 48 mg/dL (calc) Final    Comment:    Reference range: <100 . Desirable range <100 mg/dL for primary prevention;   <70 mg/dL for patients with CHD or diabetic patients  with > or = 2 CHD risk factors. Marland Kitchen LDL-C is now calculated using the Martin-Hopkins  calculation, which is a validated novel method providing  better accuracy than the Friedewald equation in the  estimation of LDL-C.  Horald Pollen et al. Lenox Ahr. 8756;433(29): 2061-2068  (http://education.[facility].com/faq/FAQ164)    HDL  Date Value Ref Range Status  12/26/2022 32 (L) > OR = 50 mg/dL Final  51/88/4166 33 (L) >39 mg/dL Final   Triglycerides  Date Value Ref Range Status  12/26/2022 86 <150 mg/dL Final         Passed - Patient is not pregnant      Passed - Valid encounter within last 12 months    Recent Outpatient Visits           2 months ago Encounter for general adult medical examination with abnormal findings    [facility] [place], Salvadore [place], NP   8  months ago Controlled type 2 diabetes mellitus with hyperglycemia, without long-term current use of insulin [facility])   [place] [facility] [place], [place], NP   10 months ago Acute pain of right shoulder   [place] [facility] Smitty Cords, DO   1 year ago Encounter for general adult medical examination with abnormal findings   [place] [facility] [place], Salvadore Oxford, NP   1 year ago Controlled type 2 diabetes mellitus with hyperglycemia, without long-term current use of insulin [facility])   [place] [facility] [place], Salvadore [place], [place]

## 2023-03-08 NOTE — Telephone Encounter (Signed)
Requested Prescriptions  Pending Prescriptions Disp Refills   gabapentin (NEURONTIN) 400 MG capsule [Pharmacy Med Name: Gabapentin 400 MG Oral Capsule] 100 capsule 2    Sig: TAKE 1 CAPSULE BY MOUTH AT  BEDTIME     Neurology: Anticonvulsants - gabapentin Passed - 03/07/2023 10:18 PM      Passed - Cr in normal range and within 360 days    Creat  Date Value Ref Range Status  12/26/2022 0.81 0.50 - 1.03 mg/dL Final   Creatinine, Urine  Date Value Ref Range Status  12/26/2022 139 20 - 275 mg/dL Final         Passed - Completed PHQ-2 or PHQ-9 in the last 360 days      Passed - Valid encounter within last 12 months    Recent Outpatient Visits           2 months ago Encounter for general adult medical examination with abnormal findings   [place] [facility] [place], [place], NP   8 months ago Controlled type 2 diabetes mellitus with hyperglycemia, without long-term current use of insulin [facility])   [place] [facility] [place], Salvadore [place], NP   10 months ago Acute pain of right shoulder   [place] [facility] Smitty Cords, DO   1 year ago Encounter for general adult medical examination with abnormal findings   [place] [facility] [place], [place] W, NP   1 year ago Controlled type 2 diabetes mellitus with hyperglycemia, without long-term current use of insulin [facility])   [place] [facility] [place], [place] W, NP               atorvastatin (LIPITOR) 10 MG tablet [Pharmacy Med Name: Atorvastatin Calcium 10 MG Oral Tablet] 100 tablet 2    Sig: TAKE 1 TABLET BY MOUTH DAILY     Cardiovascular:  Antilipid - Statins Failed - 03/07/2023 10:18 PM      Failed - Lipid Panel in normal range within the last 12 months    Cholesterol, Total  Date Value Ref Range Status  03/01/2021 168 100 - 199 mg/dL Final   Cholesterol  Date Value Ref Range Status  12/26/2022 97 <200 mg/dL Final   LDL  Cholesterol (Calc)  Date Value Ref Range Status  12/26/2022 48 mg/dL (calc) Final    Comment:    Reference range: <100 . Desirable range <100 mg/dL for primary prevention;   <70 mg/dL for patients with CHD or diabetic patients  with > or = 2 CHD risk factors. Marland Kitchen LDL-C is now calculated using the Martin-Hopkins  calculation, which is a validated novel method providing  better accuracy than the Friedewald equation in the  estimation of LDL-C.  Horald Pollen et al. Lenox Ahr. 6734;193(79): 2061-2068  (http://education.[facility].com/faq/FAQ164)    HDL  Date Value Ref Range Status  12/26/2022 32 (L) > OR = 50 mg/dL Final  02/40/9735 33 (L) >39 mg/dL Final   Triglycerides  Date Value Ref Range Status  12/26/2022 86 <150 mg/dL Final         Passed - Patient is not pregnant      Passed - Valid encounter within last 12 months    Recent Outpatient Visits           2 months ago Encounter for general adult medical examination with abnormal findings    [facility] [place], Salvadore [place], NP   8 months ago Controlled  type 2 diabetes mellitus with hyperglycemia, without long-term current use of insulin [facility])   [place] [facility] [place], [place], NP   10 months ago Acute pain of right shoulder   [place] [facility] Smitty Cords, DO   1 year ago Encounter for general adult medical examination with abnormal findings   [place] [facility] [place], Salvadore Oxford, NP   1 year ago Controlled type 2 diabetes mellitus with hyperglycemia, without long-term current use of insulin [facility])   [place] [facility] [place], Salvadore [place], [place]

## 2023-03-09 ENCOUNTER — Ambulatory Visit (INDEPENDENT_AMBULATORY_CARE_PROVIDER_SITE_OTHER): Payer: 59

## 2023-03-09 VITALS — Ht 66.0 in | Wt 246.0 lb

## 2023-03-09 DIAGNOSIS — Z0000 Encounter for general adult medical examination without abnormal findings: Secondary | ICD-10-CM | POA: Diagnosis not present

## 2023-03-09 NOTE — Patient Instructions (Signed)
Chelsea Hudson , Thank you for taking time to come for your Medicare Wellness Visit. I appreciate your ongoing commitment to your health goals. Please review the following plan we discussed and let me know if I can assist you in the future.   These are the goals we discussed:  Goals      DIET - EAT MORE FRUITS AND VEGETABLES     Weight (lb) < 200 lb (90.7 kg)     Pt enjoy line dancing.         This is a list of the screening recommended for you and due dates:  Health Maintenance  Topic Date Due   COVID-19 Vaccine (1) Never done   DTaP/Tdap/Td vaccine (1 - Tdap) Never done   Zoster (Shingles) Vaccine (1 of 2) Never done   Eye exam for diabetics  06/09/2023   Flu Shot  06/21/2023   Hemoglobin A1C  06/26/2023   Yearly kidney function blood test for diabetes  12/27/2023   Yearly kidney health urinalysis for diabetes  12/27/2023   Complete foot exam   12/27/2023   Mammogram  01/22/2024   Medicare Annual Wellness Visit  03/08/2024   Pap Smear  11/23/2024   Colon Cancer Screening  06/26/2027   Hepatitis C Screening: USPSTF Recommendation to screen - Ages 18-79 yo.  Completed   HIV Screening  Completed   HPV Vaccine  Aged Out    Advanced directives: NO  Conditions/risks identified: NONE  Next appointment: Follow up in one year for your annual wellness visit. 03/14/24 @ 1:00 PM BY PHONE  Preventive Care 40-64 Years, Female Preventive care refers to lifestyle choices and visits with your health care provider that can promote health and wellness. What does preventive care include? A yearly physical exam. This is also called an annual well check. Dental exams once or twice a year. Routine eye exams. Ask your health care provider how often you should have your eyes checked. Personal lifestyle choices, including: Daily care of your teeth and gums. Regular physical activity. Eating a healthy diet. Avoiding tobacco and drug use. Limiting alcohol use. Practicing safe sex. Taking  low-dose aspirin daily starting at age 64. Taking vitamin and mineral supplements as recommended by your health care provider. What happens during an annual well check? The services and screenings done by your health care provider during your annual well check will depend on your age, overall health, lifestyle risk factors, and family history of disease. Counseling  Your health care provider may ask you questions about your: Alcohol use. Tobacco use. Drug use. Emotional well-being. Home and relationship well-being. Sexual activity. Eating habits. Work and work Astronomer. Method of birth control. Menstrual cycle. Pregnancy history. Screening  You may have the following tests or measurements: Height, weight, and BMI. Blood pressure. Lipid and cholesterol levels. These may be checked every 5 years, or more frequently if you are over 61 years old. Skin check. Lung cancer screening. You may have this screening every year starting at age 57 if you have a 30-pack-year history of smoking and currently smoke or have quit within the past 15 years. Fecal occult blood test (FOBT) of the stool. You may have this test every year starting at age 45. Flexible sigmoidoscopy or colonoscopy. You may have a sigmoidoscopy every 5 years or a colonoscopy every 10 years starting at age 58. Hepatitis C blood test. Hepatitis B blood test. Sexually transmitted disease (STD) testing. Diabetes screening. This is done by checking your blood sugar (glucose) after  you have not eaten for a while (fasting). You may have this done every 1-3 years. Mammogram. This may be done every 1-2 years. Talk to your health care provider about when you should start having regular mammograms. This may depend on whether you have a family history of breast cancer. BRCA-related cancer screening. This may be done if you have a family history of breast, ovarian, tubal, or peritoneal cancers. Pelvic exam and Pap test. This may be done  every 3 years starting at age 55. Starting at age 7, this may be done every 5 years if you have a Pap test in combination with an HPV test. Bone density scan. This is done to screen for osteoporosis. You may have this scan if you are at high risk for osteoporosis. Discuss your test results, treatment options, and if necessary, the need for more tests with your health care provider. Vaccines  Your health care provider may recommend certain vaccines, such as: Influenza vaccine. This is recommended every year. Tetanus, diphtheria, and acellular pertussis (Tdap, Td) vaccine. You may need a Td booster every 10 years. Zoster vaccine. You may need this after age 18. Pneumococcal 13-valent conjugate (PCV13) vaccine. You may need this if you have certain conditions and were not previously vaccinated. Pneumococcal polysaccharide (PPSV23) vaccine. You may need one or two doses if you smoke cigarettes or if you have certain conditions. Talk to your health care provider about which screenings and vaccines you need and how often you need them. This information is not intended to replace advice given to you by your health care provider. Make sure you discuss any questions you have with your health care provider. Document Released: 12/03/2015 Document Revised: 07/26/2016 Document Reviewed: 09/07/2015 Elsevier Interactive Patient Education  2017 [place] Prevention in the Home Falls can cause injuries. They can happen to people of all ages. There are many things you can do to make your home safe and to help prevent falls. What can I do on the outside of my home? Regularly fix the edges of walkways and driveways and fix any cracks. Remove anything that might make you trip as you walk through a door, such as a raised step or threshold. Trim any bushes or trees on the path to your home. Use bright outdoor lighting. Clear any walking paths of anything that might make someone trip, such as rocks or  tools. Regularly check to see if handrails are loose or broken. Make sure that both sides of any steps have handrails. Any raised decks and porches should have guardrails on the edges. Have any leaves, snow, or ice cleared regularly. Use sand or salt on walking paths during winter. Clean up any spills in your garage right away. This includes oil or grease spills. What can I do in the bathroom? Use night lights. Install grab bars by the toilet and in the tub and shower. Do not use towel bars as grab bars. Use non-skid mats or decals in the tub or shower. If you need to sit down in the shower, use a plastic, non-slip stool. Keep the floor dry. Clean up any water that spills on the floor as soon as it happens. Remove soap buildup in the tub or shower regularly. Attach bath mats securely with double-sided non-slip rug tape. Do not have throw rugs and other things on the floor that can make you trip. What can I do in the bedroom? Use night lights. Make sure that you have a light  by your bed that is easy to reach. Do not use any sheets or blankets that are too big for your bed. They should not hang down onto the floor. Have a firm chair that has side arms. You can use this for support while you get dressed. Do not have throw rugs and other things on the floor that can make you trip. What can I do in the kitchen? Clean up any spills right away. Avoid walking on wet floors. Keep items that you use a lot in easy-to-reach places. If you need to reach something above you, use a strong step stool that has a grab bar. Keep electrical cords out of the way. Do not use floor polish or wax that makes floors slippery. If you must use wax, use non-skid floor wax. Do not have throw rugs and other things on the floor that can make you trip. What can I do with my stairs? Do not leave any items on the stairs. Make sure that there are handrails on both sides of the stairs and use them. Fix handrails that are  broken or loose. Make sure that handrails are as long as the stairways. Check any carpeting to make sure that it is firmly attached to the stairs. Fix any carpet that is loose or worn. Avoid having throw rugs at the top or bottom of the stairs. If you do have throw rugs, attach them to the floor with carpet tape. Make sure that you have a light switch at the top of the stairs and the bottom of the stairs. If you do not have them, ask someone to add them for you. What else can I do to help prevent falls? Wear shoes that: Do not have high heels. Have rubber bottoms. Are comfortable and fit you well. Are closed at the toe. Do not wear sandals. If you use a stepladder: Make sure that it is fully opened. Do not climb a closed stepladder. Make sure that both sides of the stepladder are locked into place. Ask someone to hold it for you, if possible. Clearly mark and make sure that you can see: Any grab bars or handrails. First and last steps. Where the edge of each step is. Use tools that help you move around (mobility aids) if they are needed. These include: Canes. Walkers. Scooters. Crutches. Turn on the lights when you go into a dark area. Replace any light bulbs as soon as they burn out. Set up your furniture so you have a clear path. Avoid moving your furniture around. If any of your floors are uneven, fix them. If there are any pets around you, be aware of where they are. Review your medicines with your doctor. Some medicines can make you feel dizzy. This can increase your chance of falling. Ask your doctor what other things that you can do to help prevent falls. This information is not intended to replace advice given to you by your health care provider. Make sure you discuss any questions you have with your health care provider. Document Released: 09/02/2009 Document Revised: 04/13/2016 Document Reviewed: 12/11/2014 Elsevier Interactive Patient Education  2017 [employer].

## 2023-03-09 NOTE — Progress Notes (Signed)
I connected with  Janan Ridge on 03/09/23 by a audio enabled telemedicine application and verified that I am speaking with the correct person using two identifiers.  Patient Location: Home  Provider Location: Office/Clinic  I discussed the limitations of evaluation and management by telemedicine. The patient expressed understanding and agreed to proceed.  Subjective:   BEZA STEPPE is a 57 y.o. female who presents for Medicare Annual (Subsequent) preventive examination.  Review of Systems     Cardiac Risk Factors include: advanced age (>79men, >75 women);diabetes mellitus;hypertension     Objective:    There were no vitals filed for this visit. There is no height or weight on file to calculate BMI.     03/09/2023    2:04 PM 11/08/2022    2:22 PM 06/06/2022    8:31 AM 05/09/2022    2:03 PM 05/03/2021    2:19 PM 10/28/2020    3:23 PM 08/24/2020    4:19 PM  Advanced Directives  Does Patient Have a Medical Advance Directive? No No No No No No No  Would patient like information on creating a medical advance directive? No - Patient declined  No - Patient declined  No - Patient declined No - Patient declined No - Patient declined    Current Medications (verified) Outpatient Encounter Medications as of 03/09/2023  Medication Sig   atorvastatin (LIPITOR) 10 MG tablet TAKE 1 TABLET BY MOUTH DAILY   baclofen (LIORESAL) 10 MG tablet Take 0.5-1 tablets (5-10 mg total) by mouth 2 (two) times daily as needed for muscle spasms.   letrozole (FEMARA) 2.5 MG tablet TAKE 1 TABLET BY MOUTH DAILY   lisinopril (ZESTRIL) 5 MG tablet TAKE 1 TABLET BY MOUTH DAILY   metFORMIN (GLUCOPHAGE) 500 MG tablet TAKE 2 TABLETS BY MOUTH ONCE  DAILY WITH BREAKFAST AND 1  TABLET WITH SUPPER   potassium chloride (KLOR-CON) 10 MEQ tablet TAKE 1 TABLET BY MOUTH TWICE  DAILY   triamterene-hydrochlorothiazide (MAXZIDE-25) 37.5-25 MG tablet TAKE 1 TABLET BY MOUTH DAILY   naproxen (NAPROSYN) 500 MG tablet Take 1  tablet (500 mg total) by mouth 2 (two) times daily with a meal. For 1-2 weeks then as needed (Patient not taking: Reported on 03/09/2023)   No facility-administered encounter medications on file as of 03/09/2023.    Allergies (verified) Other   History: Past Medical History:  Diagnosis Date   Arthritis    SHOULDER   Breast cancer 02/2017   rt breast   Breast cancer 2018   Cancer 02/28/2017   INVASIVE MAMMARY CARCINOMA WITH MUCINOUS FEATURES.   Colonic diverticular abscess 06/21/2017   Colonoscopy 06/25/2017: No evidence of malignancy.   Diabetes mellitus without complication    Hypertension    Irregular heart beat    PT STATES IT "SKIPS A BEAT"    Obesity    Personal history of chemotherapy    Personal history of radiation therapy    Past Surgical History:  Procedure Laterality Date   ANKLE SURGERY     BREAST BIOPSY Right 02/28/2017   INVASIVE MAMMARY CARCINOMA WITH MUCINOUS FEATURES.   BREAST CYST ASPIRATION Right    NEG   COLONOSCOPY WITH PROPOFOL N/A 06/25/2017   Procedure: COLONOSCOPY WITH PROPOFOL;  Surgeon: Wyline Mood, MD;  Location: [facility];  Service: Gastroenterology;  Laterality: N/A;   MASTECTOMY, PARTIAL Right 07/30/2017   Procedure: MASTECTOMY PARTIAL;  Surgeon: Earline Mayotte, MD;  Location: [facility] ORS;  Service: General;  Laterality: Right;   PORTACATH PLACEMENT Left 03/15/2017  Procedure: INSERTION PORT-A-CATH;  Surgeon: Earline Mayotte, MD;  Location: [facility] ORS;  Service: General;  Laterality: Left;   RE-EXCISION OF BREAST LUMPECTOMY Right 08/17/2017    INVASIVE CARCINOMA EXTENDS TO NEW LATERAL MARGIN. /RE-EXCISION OF BREAST LUMPECTOMY;: Byrnett, Merrily Pew, MD;  [facility] ORS; General;  Laterality: Right;   SENTINEL NODE BIOPSY Right 07/30/2017   Procedure: SENTINEL NODE BIOPSY;  Surgeon: Earline Mayotte, MD;  Location: [facility] OR[facility];  Service: General;  Laterality: Right;   SIMPLE MASTECTOMY WITH AXILLARY SENTINEL NODE BIOPSY Right 09/28/2017   Procedure:  SIMPLE MASTECTOMY;  Surgeon: Earline Mayotte, MD;  Location: [facility] OR[facility];  Service: General;  Laterality: Right;   Family History  Problem Relation Age of Onset   Diabetes Father    Stroke Father    Hypertension Father    Hypertension Mother    Brain cancer Maternal Aunt 58   Diabetes Sister    Colon cancer Neg Hx    Breast cancer Neg Hx    Social History   Socioeconomic History   Marital status: Married    Spouse name: Not on file   Number of children: Not on file   Years of education: Not on file   Highest education level: Not on file  Occupational History   Not on file  Tobacco Use   Smoking status: Former    Packs/day: 0.25    Years: 25.00    Additional pack years: 0.00    Total pack years: 6.25    Types: Cigarettes    Quit date: 10/20/2017    Years since quitting: 5.3   Smokeless tobacco: Former  Building services engineer Use: Never used  Substance and Sexual Activity   Alcohol use: Not Currently    Comment: occas   Drug use: No   Sexual activity: Not Currently  Other Topics Concern   Not on file  Social History Narrative   Not on file   Social Determinants of Health   Financial Resource Strain: Low Risk  (03/09/2023)   Overall Financial Resource Strain (CARDIA)    Difficulty of Paying Living Expenses: Not hard at all  Food Insecurity: No Food Insecurity (03/09/2023)   Hunger Vital Sign    Worried About Running Out of Food in the Last Year: Never true    Ran Out of Food in the Last Year: Never true  Transportation Needs: No Transportation Needs (03/09/2023)   PRAPARE - Administrator, Civil Service (Medical): No    Lack of Transportation (Non-Medical): No  Physical Activity: Inactive (03/09/2023)   Exercise Vital Sign    Days of Exercise per Week: 0 days    Minutes of Exercise per Session: 0 min  Stress: No Stress Concern Present (03/09/2023)   [employer] of Occupational Health - Occupational Stress Questionnaire    Feeling of Stress : Not at  all  Social Connections: Moderately Integrated (03/09/2023)   Social Connection and Isolation Panel [NHANES]    Frequency of Communication with Friends and Family: More than three times a week    Frequency of Social Gatherings with Friends and Family: Once a week    Attends Religious Services: More than 4 times per year    Active Member of [employer] or Organizations: No    Attends Banker Meetings: Never    Marital Status: Married    Tobacco Counseling Counseling given: Not Answered   Clinical Intake:  Pre-visit preparation completed: Yes  Pain : No/denies pain  Nutritional Risks: None Diabetes: Yes CBG done?: No Did pt. bring in CBG monitor from home?: No  How often do you need to have someone help you when you read instructions, pamphlets, or other written materials from your doctor or pharmacy?: 1 - Never  Diabetic?YES Nutrition Risk Assessment:  Has the patient had any N/V/D within the last 2 months?  No  Does the patient have any non-healing wounds?  No  Has the patient had any unintentional weight loss or weight gain?  No   Diabetes:  Is the patient diabetic?  Yes  If diabetic, was a CBG obtained today?  No  Did the patient bring in their glucometer from home?  No  How often do you monitor your CBG's? NEVER.   Financial Strains and Diabetes Management:  Are you having any financial strains with the device, your supplies or your medication? No .  Does the patient want to be seen by Chronic Care Management for management of their diabetes?  No  Would the patient like to be referred to a Nutritionist or for Diabetic Management?  No   Diabetic Exams:  Diabetic Eye Exam: Completed 06/08/22.  Pt has been advised about the importance in completing this exam.  Diabetic Foot Exam: Completed 12/26/22. Pt has been advised about the importance in completing this exam.   Interpreter Needed?: No  Information entered by :: Kennedy Bucker, LPN   Activities of  Daily Living    03/09/2023    2:05 PM 12/26/2022    3:35 PM  In your present state of health, do you have any difficulty performing the following activities:  Hearing? 0 0  Vision? 0 0  Difficulty concentrating or making decisions? 0 0  Walking or climbing stairs? 0 0  Dressing or bathing? 0 0  Doing errands, shopping? 0 0  Preparing Food and eating ? N   Using the Toilet? N   In the past six months, have you accidently leaked urine? N   Do you have problems with loss of bowel control? N   Managing your Medications? N   Managing your Finances? N   Housekeeping or managing your Housekeeping? N     Patient Care Team: Lorre Munroe, NP as PCP - General (Internal Medicine) Jim Like, RN as Registered Nurse Scarlett Presto, RN (Inactive) as Registered Nurse Lemar Livings, Merrily Pew, MD (General Surgery)  Indicate any recent Medical Services you may have received from other than [facility] providers in the past year (date may be approximate).     Assessment:   This is a routine wellness examination for Rosaleah.  Hearing/Vision screen Hearing Screening - Comments:: NO AIDS Vision Screening - Comments:: WEARS GLASSES- [facility]   Dietary issues and exercise activities discussed: Current Exercise Habits: The patient does not participate in regular exercise at present, Exercise limited by: None identified   Goals Addressed             This Visit's Progress    DIET - EAT MORE FRUITS AND VEGETABLES         Depression Screen    03/09/2023    2:03 PM 12/26/2022    3:35 PM 05/02/2022    4:28 PM 03/06/2022   10:37 AM 12/20/2021    3:36 PM 11/23/2021    3:16 PM 09/19/2021   10:03 AM  PHQ 2/9 Scores  PHQ - 2 Score 0 0 1 2 0 0 0  PHQ- 9 Score 0  2 4 0 1  Fall Risk    03/09/2023    2:04 PM 12/26/2022    3:35 PM 05/02/2022    4:27 PM 03/06/2022   10:37 AM 12/20/2021    3:36 PM  Fall Risk   Falls in the past year? 0 0 0 0 0  Number falls in past yr: 0  0 0 0  Injury with Fall?  0 0 0 0 0  Risk for fall due to : No Fall Risks No Fall Risks No Fall Risks No Fall Risks No Fall Risks  Follow up Falls prevention discussed;Falls evaluation completed  Falls evaluation completed Falls evaluation completed Falls evaluation completed    FALL RISK PREVENTION PERTAINING TO THE HOME:  Any stairs in or around the home? Yes  If so, are there any without handrails? No  Home free of loose throw rugs in walkways, pet beds, electrical cords, etc? Yes  Adequate lighting in your home to reduce risk of falls? Yes   ASSISTIVE DEVICES UTILIZED TO PREVENT FALLS:  Life alert? No  Use of a cane, walker or w/c? No  Grab bars in the bathroom? No  Shower chair or bench in shower? No  Elevated toilet seat or a handicapped toilet? No    Cognitive Function:        03/09/2023    2:09 PM 03/06/2022   10:39 AM  6CIT Screen  What Year? 0 points 0 points  What month? 0 points 0 points  What time? 3 points 0 points  Count back from 20 0 points 0 points  Months in reverse 0 points 0 points  Repeat phrase 4 points 2 points  Total Score 7 points 2 points    Immunizations  There is no immunization history on file for this patient.  TDAP status: Due, Education has been provided regarding the importance of this vaccine. Advised may receive this vaccine at local pharmacy or Health Dept. Aware to provide a copy of the vaccination record if obtained from local pharmacy or Health Dept. Verbalized acceptance and understanding.  Flu Vaccine status: Declined, Education has been provided regarding the importance of this vaccine but patient still declined. Advised may receive this vaccine at local pharmacy or Health Dept. Aware to provide a copy of the vaccination record if obtained from local pharmacy or Health Dept. Verbalized acceptance and understanding.  Pneumococcal vaccine status: Declined,  Education has been provided regarding the importance of this vaccine but patient still declined.  Advised may receive this vaccine at local pharmacy or Health Dept. Aware to provide a copy of the vaccination record if obtained from local pharmacy or Health Dept. Verbalized acceptance and understanding.   Covid-19 vaccine status: Declined, Education has been provided regarding the importance of this vaccine but patient still declined. Advised may receive this vaccine at local pharmacy or Health Dept.or vaccine clinic. Aware to provide a copy of the vaccination record if obtained from local pharmacy or Health Dept. Verbalized acceptance and understanding.  Qualifies for Shingles Vaccine? Yes   Zostavax completed No   Shingrix Completed?: No.    Education has been provided regarding the importance of this vaccine. Patient has been advised to call insurance company to determine out of pocket expense if they have not yet received this vaccine. Advised may also receive vaccine at local pharmacy or Health Dept. Verbalized acceptance and understanding.  Screening Tests Health Maintenance  Topic Date Due   COVID-19 Vaccine (1) Never done   DTaP/Tdap/Td (1 - Tdap) Never done   Zoster  Vaccines- Shingrix (1 of 2) Never done   OPHTHALMOLOGY EXAM  06/09/2023   INFLUENZA VACCINE  06/21/2023   HEMOGLOBIN A1C  06/26/2023   Diabetic kidney evaluation - eGFR measurement  12/27/2023   Diabetic kidney evaluation - Urine ACR  12/27/2023   FOOT EXAM  12/27/2023   MAMMOGRAM  01/22/2024   Medicare Annual Wellness (AWV)  03/08/2024   PAP SMEAR-Modifier  11/23/2024   COLONOSCOPY (Pts 45-40yrs Insurance coverage will need to be confirmed)  06/26/2027   Hepatitis C Screening  Completed   HIV Screening  Completed   HPV VACCINES  Aged Out    Health Maintenance  Health Maintenance Due  Topic Date Due   COVID-19 Vaccine (1) Never done   DTaP/Tdap/Td (1 - Tdap) Never done   Zoster Vaccines- Shingrix (1 of 2) Never done    Colorectal cancer screening: Type of screening: Colonoscopy. Completed 06/25/17. Repeat  every 10 years  Mammogram status: Completed 01/22/23. Repeat every year   Lung Cancer Screening: (Low Dose CT Chest recommended if Age 68-80 years, 30 pack-year currently smoking OR have quit w/in 15years.) does not qualify.   Additional Screening:  Hepatitis C Screening: does qualify; Completed 12/20/21  Vision Screening: Recommended annual ophthalmology exams for early detection of glaucoma and other disorders of the eye. Is the patient up to date with their annual eye exam?  Yes  Who is the provider or what is the name of the office in which the patient attends annual eye exams? [facility] If pt is not established with a provider, would they like to be referred to a provider to establish care? No .   Dental Screening: Recommended annual dental exams for proper oral hygiene  Community Resource Referral / Chronic Care Management: CRR required this visit?  No   CCM required this visit?  No      Plan:     I have personally reviewed and noted the following in the patient's chart:   Medical and social history Use of alcohol, tobacco or illicit drugs  Current medications and supplements including opioid prescriptions. Patient is not currently taking opioid prescriptions. Functional ability and status Nutritional status Physical activity Advanced directives List of other physicians Hospitalizations, surgeries, and ER visits in previous 12 months Vitals Screenings to include cognitive, depression, and falls Referrals and appointments  In addition, I have reviewed and discussed with patient certain preventive protocols, quality metrics, and best practice recommendations. A written personalized care plan for preventive services as well as general preventive health recommendations were provided to patient.     Hal Hope, LPN   1/61/0960   Nurse Notes: [facility]

## 2023-04-25 ENCOUNTER — Inpatient Hospital Stay: Payer: 59 | Attending: Oncology

## 2023-04-25 DIAGNOSIS — C50411 Malignant neoplasm of upper-outer quadrant of right female breast: Secondary | ICD-10-CM | POA: Insufficient documentation

## 2023-04-25 DIAGNOSIS — Z9011 Acquired absence of right breast and nipple: Secondary | ICD-10-CM | POA: Insufficient documentation

## 2023-04-25 DIAGNOSIS — Z9221 Personal history of antineoplastic chemotherapy: Secondary | ICD-10-CM | POA: Insufficient documentation

## 2023-04-25 DIAGNOSIS — D649 Anemia, unspecified: Secondary | ICD-10-CM | POA: Insufficient documentation

## 2023-04-25 DIAGNOSIS — Z170 Estrogen receptor positive status [ER+]: Secondary | ICD-10-CM | POA: Insufficient documentation

## 2023-04-25 DIAGNOSIS — Z923 Personal history of irradiation: Secondary | ICD-10-CM | POA: Insufficient documentation

## 2023-04-25 DIAGNOSIS — Z79811 Long term (current) use of aromatase inhibitors: Secondary | ICD-10-CM | POA: Insufficient documentation

## 2023-05-03 ENCOUNTER — Other Ambulatory Visit: Payer: Self-pay | Admitting: Internal Medicine

## 2023-05-03 DIAGNOSIS — G620 Drug-induced polyneuropathy: Secondary | ICD-10-CM

## 2023-05-04 NOTE — Telephone Encounter (Signed)
No longer on current medication list Requested Prescriptions  Pending Prescriptions Disp Refills   gabapentin (NEURONTIN) 400 MG capsule [Pharmacy Med Name: Gabapentin 400 MG Oral Capsule] 60 capsule 5    Sig: TAKE 1 CAPSULE BY MOUTH AT  BEDTIME     Neurology: Anticonvulsants - gabapentin Passed - 05/03/2023 10:09 PM      Passed - Cr in normal range and within 360 days    Creat  Date Value Ref Range Status  12/26/2022 0.81 0.50 - 1.03 mg/dL Final   Creatinine, Urine  Date Value Ref Range Status  12/26/2022 139 20 - 275 mg/dL Final         Passed - Completed PHQ-2 or PHQ-9 in the last 360 days      Passed - Valid encounter within last 12 months    Recent Outpatient Visits           4 months ago Encounter for general adult medical examination with abnormal findings   [place] [facility] [place], [place] W, NP   10 months ago Controlled type 2 diabetes mellitus with hyperglycemia, without long-term current use of insulin [facility])   [place] [facility] [place], [place], NP   1 year ago Acute pain of right shoulder   [place] [facility] [place] Cords, DO   1 year ago Encounter for general adult medical examination with abnormal findings   [place] [facility] [place], [place], NP   1 year ago Controlled type 2 diabetes mellitus with hyperglycemia, without long-term current use of insulin [facility])   [place] [facility] [place], [place], [place]

## 2023-05-14 ENCOUNTER — Inpatient Hospital Stay: Payer: 59

## 2023-05-16 ENCOUNTER — Inpatient Hospital Stay: Payer: 59

## 2023-05-16 ENCOUNTER — Inpatient Hospital Stay (HOSPITAL_BASED_OUTPATIENT_CLINIC_OR_DEPARTMENT_OTHER): Payer: 59 | Admitting: Oncology

## 2023-05-16 ENCOUNTER — Encounter: Payer: Self-pay | Admitting: Oncology

## 2023-05-16 ENCOUNTER — Other Ambulatory Visit: Payer: Self-pay

## 2023-05-16 ENCOUNTER — Other Ambulatory Visit: Payer: 59

## 2023-05-16 VITALS — BP 124/80 | HR 99 | Temp 97.6°F | Resp 18 | Wt 227.2 lb

## 2023-05-16 DIAGNOSIS — C50411 Malignant neoplasm of upper-outer quadrant of right female breast: Secondary | ICD-10-CM | POA: Diagnosis not present

## 2023-05-16 DIAGNOSIS — Z170 Estrogen receptor positive status [ER+]: Secondary | ICD-10-CM

## 2023-05-16 DIAGNOSIS — Z95828 Presence of other vascular implants and grafts: Secondary | ICD-10-CM

## 2023-05-16 DIAGNOSIS — D649 Anemia, unspecified: Secondary | ICD-10-CM | POA: Diagnosis not present

## 2023-05-16 DIAGNOSIS — Z9011 Acquired absence of right breast and nipple: Secondary | ICD-10-CM | POA: Diagnosis not present

## 2023-05-16 DIAGNOSIS — Z853 Personal history of malignant neoplasm of breast: Secondary | ICD-10-CM | POA: Diagnosis not present

## 2023-05-16 DIAGNOSIS — Z923 Personal history of irradiation: Secondary | ICD-10-CM | POA: Diagnosis not present

## 2023-05-16 DIAGNOSIS — R053 Chronic cough: Secondary | ICD-10-CM

## 2023-05-16 DIAGNOSIS — Z79811 Long term (current) use of aromatase inhibitors: Secondary | ICD-10-CM

## 2023-05-16 DIAGNOSIS — Z9221 Personal history of antineoplastic chemotherapy: Secondary | ICD-10-CM | POA: Diagnosis not present

## 2023-05-16 DIAGNOSIS — D638 Anemia in other chronic diseases classified elsewhere: Secondary | ICD-10-CM

## 2023-05-16 DIAGNOSIS — R059 Cough, unspecified: Secondary | ICD-10-CM

## 2023-05-16 LAB — COMPREHENSIVE METABOLIC PANEL
ALT: 21 U/L (ref 0–44)
AST: 20 U/L (ref 15–41)
Albumin: 3.4 g/dL — ABNORMAL LOW (ref 3.5–5.0)
Alkaline Phosphatase: 100 U/L (ref 38–126)
Anion gap: 10 (ref 5–15)
BUN: 14 mg/dL (ref 6–20)
CO2: 25 mmol/L (ref 22–32)
Calcium: 9.3 mg/dL (ref 8.9–10.3)
Chloride: 101 mmol/L (ref 98–111)
Creatinine, Ser: 0.87 mg/dL (ref 0.44–1.00)
GFR, Estimated: >60 mL/min (ref >60)
Glucose, Bld: 227 mg/dL — ABNORMAL HIGH (ref 70–99)
Potassium: 3.4 mmol/L — ABNORMAL LOW (ref 3.5–5.1)
Sodium: 136 mmol/L (ref 135–145)
Total Bilirubin: 0.3 mg/dL (ref 0.3–1.2)
Total Protein: 7.4 g/dL (ref 6.5–8.1)

## 2023-05-16 LAB — IRON AND TIBC
Iron: 42 ug/dL (ref 28–170)
Saturation Ratios: 15 % (ref 10.4–31.8)
TIBC: 281 ug/dL (ref 250–450)
UIBC: 239 ug/dL

## 2023-05-16 LAB — CBC WITH DIFFERENTIAL/PLATELET
Abs Immature Granulocytes: 0.03 10*3/uL (ref 0.00–0.07)
Basophils Absolute: 0 10*3/uL (ref 0.0–0.1)
Basophils Relative: 0 %
Eosinophils Absolute: 0.1 10*3/uL (ref 0.0–0.5)
Eosinophils Relative: 2 %
HCT: 34.1 % — ABNORMAL LOW (ref 36.0–46.0)
Hemoglobin: 10.6 g/dL — ABNORMAL LOW (ref 12.0–15.0)
Immature Granulocytes: 0 %
Lymphocytes Relative: 21 %
Lymphs Abs: 1.6 10*3/uL (ref 0.7–4.0)
MCH: 24.1 pg — ABNORMAL LOW (ref 26.0–34.0)
MCHC: 31.1 g/dL (ref 30.0–36.0)
MCV: 77.7 fL — ABNORMAL LOW (ref 80.0–100.0)
Monocytes Absolute: 0.3 10*3/uL (ref 0.1–1.0)
Monocytes Relative: 5 %
Neutro Abs: 5.4 10*3/uL (ref 1.7–7.7)
Neutrophils Relative %: 72 %
Platelets: 327 10*3/uL (ref 150–400)
RBC: 4.39 MIL/uL (ref 3.87–5.11)
RDW: 16.6 % — ABNORMAL HIGH (ref 11.5–15.5)
WBC: 7.5 10*3/uL (ref 4.0–10.5)
nRBC: 0 % (ref 0.0–0.2)

## 2023-05-16 LAB — FERRITIN: Ferritin: 96 ng/mL (ref 11–307)

## 2023-05-16 MED ORDER — HEPARIN SOD (PORK) LOCK FLUSH 100 UNIT/ML IV SOLN
500.0000 [IU] | Freq: Once | INTRAVENOUS | Status: AC
  Administered 2023-05-16: 500 [IU] via INTRAVENOUS
  Filled 2023-05-16: qty 5

## 2023-05-16 MED ORDER — SODIUM CHLORIDE 0.9% FLUSH
10.0000 mL | Freq: Once | INTRAVENOUS | Status: AC
  Administered 2023-05-16: 10 mL via INTRAVENOUS
  Filled 2023-05-16: qty 10

## 2023-05-16 MED ORDER — LETROZOLE 2.5 MG PO TABS: 2.5000 mg | ORAL_TABLET | Freq: Every day | ORAL | 0 refills | Status: DC

## 2023-05-16 NOTE — Assessment & Plan Note (Deleted)
Clinically, she is doing well. Exam reveals no evidence of recurrent disease, focal tight scarring tissue. . Continue annual screen mammogram of left breast.  Labs are reviewed and discussed with patient. 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  At risk of lymphedema, refer to lymphedema clinic.   

## 2023-05-16 NOTE — Assessment & Plan Note (Signed)
History of right breast cancer[ 2018] S/p right mastectomy, letrozole since 05/2018 Clinically, she is doing well. Exam reveals no evidence of recurrent disease, focal tight scarring tissue. . Continue annual screen mammogram of left breast.  Labs are reviewed and discussed with patient. CA 27.29 is stable and WNL Continue Letrozole 2.5mg daily.  

## 2023-05-17 LAB — CANCER ANTIGEN 27.29: CA 27.29: 17.5 U/mL (ref 0.0–38.6)

## 2023-05-17 NOTE — Assessment & Plan Note (Addendum)
Bone density on 05/04/2021 was normal. Plan repeat in 2024 Recommend patient to take calcium 1200mg  and vitamin D supplementation.

## 2023-05-17 NOTE — Progress Notes (Signed)
Hematology/Oncology Progress note [phone] [phone]     Clinic Day:  05/16/2023   Referring physician: Lorre Munroe, NP  Assessment & Plan:  Malignant neoplasm of upper-outer quadrant of right breast in female, estrogen receptor positive (HCC) History of right breast cancer[ 2018] S/p right mastectomy, letrozole since 05/2018 Clinically, she is doing well.  Continue annual screen mammogram of left breast.  Labs are reviewed and discussed with patient. CA 27.29 is stable and WNL Continue Letrozole 2.5mg  daily.   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   Normocytic anemia Lab Results  Component Value Date   HGB 10.6 (L) 05/16/2023   TIBC 281 05/16/2023   IRONPCTSAT 15 05/16/2023   FERRITIN 96 05/16/2023   Recommend patient to take oral iron supplementation, ferrous sulfate 325mg  BID.  Repeat levels in 3 months.  Aromatase inhibitor use Bone density on 05/04/2021 was normal. Plan repeat in 2024 Recommend patient to take calcium 1200mg  and vitamin D supplementation.   Port-A-Cath in place Continue port flush Q6-8 weeks.  She prefers to keep medi port for now.    The patient understands the plans discussed today and is in agreement with them.  She knows to contact our office if she develops concerns prior to her next appointment.  Follow up in 6 months.   Rickard Patience, MD  Orders Placed This Encounter  Procedures   Ferritin    Standing Status:   Future    Number of Occurrences:   1    Standing Expiration Date:   05/15/2024   CBC with Differential (Cancer Center Only)    Standing Status:   Future    Standing Expiration Date:   05/15/2024   CMP (Cancer Center only)    Standing Status:   Future    Standing Expiration Date:   05/15/2024   Vitamin B12    Standing Status:   Future    Standing Expiration Date:   05/15/2024   Folate    Standing Status:   Future    Standing Expiration Date:   05/15/2024   Ferritin     Standing Status:   Future    Standing Expiration Date:   05/15/2024   Retic Panel    Standing Status:   Future    Standing Expiration Date:   05/15/2024   Iron and TIBC    Standing Status:   Future    Standing Expiration Date:   05/15/2024      Chief complaints: follow up for history of breast cancer.   HPI:  Patient previously followed up by Dr.Corcoran, patient switched care to me on 05/09/22 Extensive medical record review was performed by me  - stage IIIC invasive carcinoma of the upper outer quadrant of the right breast s/p neoadjuvant chemotherapy, surgery, and radiation.  Biopsy on 02/28/2017 revealed invasive carcinoma with mucinous features.  Lymph node biopsy revealed metastatic disease.  Tumor was ER positive (90%), PR positive (90%) and HER-2/neu negative.  She had clinical stage T3N3aM0 breast cancer.   PET scan on 03/09/2017 revealed hypermetabolic right axillary/subpectoral adenopathy.  There was low-grade activity in the right upper breast likely at the postoperative site.  Appearance was compatible with metastatic spread to right axillary/subpectoral lymph nodes.  There was some sigmoid colon diverticulosis, with faint inflammatory findings adjacent to the sigmoid colon proximally, and with accentuated activity in the involved segment of the sigmoid colon.    Partial mastectomy and sentinel lymph node biopsy on 07/30/2017 revealed a  1.8 cm invasive mammary carcinoma with mucinous features.  There was lymphovascular invasion.  Eight of 12 lymph nodes were positive for metastatic disease.   She required multiple excisions to obtain clear margins resulting in a full mastectomy on 09/28/2017.  She underwent complete axillary node dissection on 03/04/2018.  Level 2 and 3 lymph node dissection revealed 3 of 3 lymph nodes positive for carcinoma.  The largest tumor deposit was at least 20 mm.  Extracapsular extension was present < 1 mm beyond the lymph node capsule.   She received 4  cycles of AC (03/22/2017 - 05/02/2017) and 7 of 12 cycles of neoadjuvant Taxol (05/16/2017 - 06/27/2017).  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  She received adjuvant [facility] at [facility].  She began Letrozole in 05/2018.  Patient reports feeling well. Occasionally she has sharp pain at the previous mastectomy site. Otherwise she has no new complaints.  She tolerates Letrozole, with no side effects.  01/18/22 left screening mammogram negative.    INTERVAL HISTORY Chelsea Hudson is a 57 y.o. female who has above history reviewed by me today presents for follow up visit for history of Stage III right breast cancer.  She takes letrozole, tolerates well, with manageable side effects.  + occasional pain around her right mastectomy site, intermittent, usually triggered with stretching.     Review of Systems  Constitutional:  Negative for appetite change, chills, fatigue and fever.  HENT:   Negative for hearing loss and voice change.   Eyes:  Negative for eye problems.  Respiratory:  Negative for chest tightness and cough.   Cardiovascular:  Negative for chest pain.  Gastrointestinal:  Negative for abdominal distention, abdominal pain and blood in stool.  Endocrine: Negative for hot flashes.  Genitourinary:  Negative for difficulty urinating and frequency.   Musculoskeletal:  Negative for arthralgias.  Skin:  Negative for itching and rash.  Neurological:  Negative for extremity weakness.  Hematological:  Negative for adenopathy.  Psychiatric/Behavioral:  Negative for confusion.       VITALS:  Blood pressure 124/80, pulse 99, temperature 97.6 F (36.4 C), resp. rate 18, weight 227 lb 3.2 oz (103.1 kg), last menstrual period 01/31/2014.  Wt Readings from Last 3 Encounters:  05/16/23 227 lb 3.2 oz (103.1 kg)  03/09/23 246 lb (111.6 kg)  12/26/22 246 lb (111.6 kg)    Body mass index is 36.67 kg/m.  Performance status (ECOG): 0 - Asymptomatic  PHYSICAL  EXAM:  Physical Exam Constitutional:      General: She is not in acute distress.    Appearance: She is not diaphoretic.  HENT:     Head: Normocephalic and atraumatic.  Eyes:     General: No scleral icterus.    Pupils: Pupils are equal, round, and reactive to light.  Cardiovascular:     Rate and Rhythm: Normal rate and regular rhythm.     Heart sounds: No murmur heard. Pulmonary:     Effort: Pulmonary effort is normal. No respiratory distress.     Breath sounds: No wheezing.  Abdominal:     General: There is no distension.     Palpations: Abdomen is soft.     Tenderness: There is no abdominal tenderness.  Musculoskeletal:        General: Normal range of motion.     Cervical back: Normal range of motion and neck supple.  Skin:    General: Skin is warm and dry.     Findings: No erythema.  Neurological:  Mental Status: She is alert and oriented to person, place, and time.     Cranial Nerves: No cranial nerve deficit.     Motor: No abnormal muscle tone.     Coordination: Coordination normal.  Psychiatric:        Mood and Affect: Mood and affect normal.     LABS:      Latest Ref Rng & Units 05/16/2023   11:26 AM 12/26/2022    3:30 PM 11/08/2022    2:00 PM  CBC  WBC 4.0 - 10.5 K/uL 7.5  8.2  7.2   Hemoglobin 12.0 - 15.0 g/dL 13.2  44.0  10.2   Hematocrit 36.0 - 46.0 % 34.1  33.1  33.8   Platelets 150 - 400 K/uL 327  414  332       Latest Ref Rng & Units 05/16/2023   11:26 AM 12/26/2022    3:30 PM 11/08/2022    2:00 PM  CMP  Glucose 70 - 99 mg/dL 725  366  440   BUN 6 - 20 mg/dL 14  18  23    Creatinine 0.44 - 1.00 mg/dL 3.47  4.25  9.56   Sodium 135 - 145 mmol/L 136  137  136   Potassium 3.5 - 5.1 mmol/L 3.4  3.9  3.5   Chloride 98 - 111 mmol/L 101  103  104   CO2 22 - 32 mmol/L 25  27  24    Calcium 8.9 - 10.3 mg/dL 9.3  9.2  8.9   Total Protein 6.5 - 8.1 g/dL 7.4  6.7  6.9   Total Bilirubin 0.3 - 1.2 mg/dL 0.3  0.2  0.2   Alkaline Phos 38 - 126 U/L 100   92    AST 15 - 41 U/L 20  16  23    ALT 0 - 44 U/L 21  24  28      STUDIES:  No results found.    HISTORY:   Past Medical History:  Diagnosis Date   Arthritis    SHOULDER   Breast cancer (HCC) 02/2017   rt breast   Breast cancer (HCC) 2018   Cancer (HCC) 02/28/2017   INVASIVE MAMMARY CARCINOMA WITH MUCINOUS FEATURES.   Colonic diverticular abscess 06/21/2017   Colonoscopy 06/25/2017: No evidence of malignancy.   Diabetes mellitus without complication (HCC)    Hypertension    Irregular heart beat    PT STATES IT "SKIPS A BEAT"    Obesity    Personal history of chemotherapy    Personal history of radiation therapy     Past Surgical History:  Procedure Laterality Date   ANKLE SURGERY     BREAST BIOPSY Right 02/28/2017   INVASIVE MAMMARY CARCINOMA WITH MUCINOUS FEATURES.   BREAST CYST ASPIRATION Right    NEG   COLONOSCOPY WITH PROPOFOL N/A 06/25/2017   Procedure: COLONOSCOPY WITH PROPOFOL;  Surgeon: Wyline Mood, MD;  Location: [facility] ENDOSCOPY;  Service: Gastroenterology;  Laterality: N/A;   MASTECTOMY, PARTIAL Right 07/30/2017   Procedure: MASTECTOMY PARTIAL;  Surgeon: Earline Mayotte, MD;  Location: [facility] ORS;  Service: General;  Laterality: Right;   PORTACATH PLACEMENT Left 03/15/2017   Procedure: INSERTION PORT-A-CATH;  Surgeon: Earline Mayotte, MD;  Location: [facility] ORS;  Service: General;  Laterality: Left;   RE-EXCISION OF BREAST LUMPECTOMY Right 08/17/2017    INVASIVE CARCINOMA EXTENDS TO NEW LATERAL MARGIN. /RE-EXCISION OF BREAST LUMPECTOMY;: Byrnett, Merrily Pew, MD;  [facility] ORS; General;  Laterality: Right;   SENTINEL NODE BIOPSY  Right 07/30/2017   Procedure: SENTINEL NODE BIOPSY;  Surgeon: Earline Mayotte, MD;  Location: [facility] ORS;  Service: General;  Laterality: Right;   SIMPLE MASTECTOMY WITH AXILLARY SENTINEL NODE BIOPSY Right 09/28/2017   Procedure: SIMPLE MASTECTOMY;  Surgeon: Earline Mayotte, MD;  Location: [facility] ORS;  Service: General;  Laterality: Right;     Family History  Problem Relation Age of Onset   Diabetes Father    Stroke Father    Hypertension Father    Hypertension Mother    Brain cancer Maternal Aunt 51   Diabetes Sister    Colon cancer Neg Hx    Breast cancer Neg Hx     Social History:  reports that she quit smoking about 5 years ago. Her smoking use included cigarettes. She has a 6.25 pack-year smoking history. She has quit using smokeless tobacco. She reports that she does not currently use alcohol. She reports that she does not use drugs.  Allergies:  Allergies  Allergen Reactions   Other Hives and Itching    [name]     Current Medications: Current Outpatient Medications  Medication Sig Dispense Refill   atorvastatin (LIPITOR) 10 MG tablet TAKE 1 TABLET BY MOUTH DAILY 100 tablet 2   baclofen (LIORESAL) 10 MG tablet Take 0.5-1 tablets (5-10 mg total) by mouth 2 (two) times daily as needed for muscle spasms. 30 each 1   letrozole (FEMARA) 2.5 MG tablet TAKE 1 TABLET BY MOUTH DAILY 100 tablet 2   letrozole (FEMARA) 2.5 MG tablet Take 1 tablet (2.5 mg total) by mouth daily. 30 tablet 0   lisinopril (ZESTRIL) 5 MG tablet TAKE 1 TABLET BY MOUTH DAILY 100 tablet 1   metFORMIN (GLUCOPHAGE) 500 MG tablet TAKE 2 TABLETS BY MOUTH ONCE  DAILY WITH BREAKFAST AND 1  TABLET WITH SUPPER 300 tablet 1   potassium chloride (KLOR-CON) 10 MEQ tablet TAKE 1 TABLET BY MOUTH TWICE  DAILY 180 tablet 1   triamterene-hydrochlorothiazide (MAXZIDE-25) 37.5-25 MG tablet TAKE 1 TABLET BY MOUTH DAILY 100 tablet 1   naproxen (NAPROSYN) 500 MG tablet Take 1 tablet (500 mg total) by mouth 2 (two) times daily with a meal. For 1-2 weeks then as needed (Patient not taking: Reported on 03/09/2023) 60 tablet 1   No current facility-administered medications for this visit.

## 2023-05-17 NOTE — Assessment & Plan Note (Signed)
Continue port flush Q6-8 weeks.  She prefers to keep medi port for now.

## 2023-05-17 NOTE — Assessment & Plan Note (Addendum)
Lab Results  Component Value Date   HGB 10.6 (L) 05/16/2023   TIBC 281 05/16/2023   IRONPCTSAT 15 05/16/2023   FERRITIN 96 05/16/2023   Recommend patient to take oral iron supplementation, ferrous sulfate 325mg  BID.  Repeat levels in 3 months.

## 2023-06-10 ENCOUNTER — Other Ambulatory Visit: Payer: Self-pay | Admitting: Internal Medicine

## 2023-06-10 DIAGNOSIS — E876 Hypokalemia: Secondary | ICD-10-CM

## 2023-06-12 NOTE — Telephone Encounter (Signed)
Unable to refill per protocol, Rx request is too soon. Last refill 03/07/23 for 90 and 1 refill.  Requested Prescriptions  Pending Prescriptions Disp Refills   potassium chloride (KLOR-CON) 10 MEQ tablet [Pharmacy Med Name: Potassium Chloride ER 10 MEQ Oral Tablet Extended Release] 200 tablet 2    Sig: TAKE 1 TABLET BY MOUTH TWICE  DAILY     Endocrinology:  Minerals - Potassium Supplementation Failed - 06/10/2023 10:23 PM      Failed - K in normal range and within 360 days    Potassium  Date Value Ref Range Status  05/16/2023 3.4 (L) 3.5 - 5.1 mmol/L Final         Passed - Cr in normal range and within 360 days    Creat  Date Value Ref Range Status  12/26/2022 0.81 0.50 - 1.03 mg/dL Final   Creatinine, Ser  Date Value Ref Range Status  05/16/2023 0.87 0.44 - 1.00 mg/dL Final   Creatinine, Urine  Date Value Ref Range Status  12/26/2022 139 20 - 275 mg/dL Final         Passed - Valid encounter within last 12 months    Recent Outpatient Visits           5 months ago Encounter for general adult medical examination with abnormal findings   [facility] [place], [place]ore [place], NP   11 months ago Controlled type 2 diabetes mellitus with hyperglycemia, without long-term current use of insulin [facility])   [place] [facility] [place], Salvadore Oxford, NP   1 year ago Acute pain of right shoulder   [place] [facility] Smitty Cords, DO   1 year ago Encounter for general adult medical examination with abnormal findings   [place] [facility] [place], [place], NP   1 year ago Controlled type 2 diabetes mellitus with hyperglycemia, without long-term current use of insulin [facility])   [place] [facility] [place], Salvadore [place], [place]

## 2023-06-13 ENCOUNTER — Other Ambulatory Visit: Payer: Self-pay | Admitting: Internal Medicine

## 2023-06-13 DIAGNOSIS — E876 Hypokalemia: Secondary | ICD-10-CM

## 2023-06-13 NOTE — Telephone Encounter (Signed)
Medication Refill - Medication:  potassium chloride (KLOR-CON) 10 MEQ tablet   Has the patient contacted their pharmacy? Yes.   Pt was advised to reach out to PCP. Pt states that it is time for her refill and is needing it sent in since she uses mail order pharmacy.    Preferred Pharmacy (with phone number or street name): [facility] - [place], [place] - [address], [place]  [address] Phone: [phone]  Fax: [phone]  Has the patient been seen for an appointment in the last year OR does the patient have an upcoming appointment? Yes.    Agent: Please be advised that RX refills may take up to 3 business days. We ask that you follow-up with your pharmacy.

## 2023-06-14 NOTE — Telephone Encounter (Signed)
Unable to refill per protocol, Rx request is too soon. Last refill 03/07/23  For 90 and 1 refill.  Requested Prescriptions  Pending Prescriptions Disp Refills   potassium chloride (KLOR-CON) 10 MEQ tablet 180 tablet 1    Sig: Take 1 tablet (10 mEq total) by mouth 2 (two) times daily.     Endocrinology:  Minerals - Potassium Supplementation Failed - 06/13/2023  1:55 PM      Failed - K in normal range and within 360 days    Potassium  Date Value Ref Range Status  05/16/2023 3.4 (L) 3.5 - 5.1 mmol/L Final         Passed - Cr in normal range and within 360 days    Creat  Date Value Ref Range Status  12/26/2022 0.81 0.50 - 1.03 mg/dL Final   Creatinine, Ser  Date Value Ref Range Status  05/16/2023 0.87 0.44 - 1.00 mg/dL Final   Creatinine, Urine  Date Value Ref Range Status  12/26/2022 139 20 - 275 mg/dL Final         Passed - Valid encounter within last 12 months    Recent Outpatient Visits           5 months ago Encounter for general adult medical examination with abnormal findings   [place] [facility] [place], [place], NP   11 months ago Controlled type 2 diabetes mellitus with hyperglycemia, without long-term current use of insulin [facility])   [place] [facility] [place], Salvadore [place], NP   1 year ago Acute pain of right shoulder   [place] [facility] Smitty Cords, DO   1 year ago Encounter for general adult medical examination with abnormal findings   [facility] [facility] [place], [place], NP   1 year ago Controlled type 2 diabetes mellitus with hyperglycemia, without long-term current use of insulin [facility])   [place] [facility] [place], Salvadore [place], [place]

## 2023-06-20 ENCOUNTER — Inpatient Hospital Stay: Payer: 59 | Attending: Oncology

## 2023-06-26 ENCOUNTER — Encounter: Payer: Self-pay | Admitting: Oncology

## 2023-06-27 ENCOUNTER — Other Ambulatory Visit: Payer: Self-pay

## 2023-06-27 MED ORDER — LETROZOLE 2.5 MG PO TABS: 2.5000 mg | ORAL_TABLET | Freq: Every day | ORAL | 0 refills | Status: DC

## 2023-07-03 ENCOUNTER — Other Ambulatory Visit: Payer: Self-pay

## 2023-07-03 DIAGNOSIS — I10 Essential (primary) hypertension: Secondary | ICD-10-CM

## 2023-07-03 DIAGNOSIS — E1165 Type 2 diabetes mellitus with hyperglycemia: Secondary | ICD-10-CM

## 2023-07-03 DIAGNOSIS — E1169 Type 2 diabetes mellitus with other specified complication: Secondary | ICD-10-CM

## 2023-07-03 DIAGNOSIS — E876 Hypokalemia: Secondary | ICD-10-CM

## 2023-07-03 MED ORDER — TRIAMTERENE-HCTZ 37.5-25 MG PO TABS
1.0000 | ORAL_TABLET | Freq: Every day | ORAL | 0 refills | Status: DC
Start: 2023-07-03 — End: 2023-07-20

## 2023-07-03 MED ORDER — POTASSIUM CHLORIDE ER 10 MEQ PO TBCR
10.0000 meq | EXTENDED_RELEASE_TABLET | Freq: Two times a day (BID) | ORAL | 0 refills | Status: DC
Start: 2023-07-03 — End: 2023-08-14

## 2023-07-03 MED ORDER — LISINOPRIL 5 MG PO TABS: 5.0000 mg | ORAL_TABLET | Freq: Every day | ORAL | 0 refills | Status: DC

## 2023-07-03 MED ORDER — METFORMIN HCL 500 MG PO TABS
ORAL_TABLET | ORAL | 0 refills | Status: DC
Start: 2023-07-03 — End: 2023-07-20

## 2023-07-03 MED ORDER — ATORVASTATIN CALCIUM 10 MG PO TABS
10.0000 mg | ORAL_TABLET | Freq: Every day | ORAL | 0 refills | Status: DC
Start: 2023-07-03 — End: 2023-08-14

## 2023-07-18 ENCOUNTER — Telehealth: Payer: Self-pay | Admitting: Internal Medicine

## 2023-07-18 DIAGNOSIS — I10 Essential (primary) hypertension: Secondary | ICD-10-CM

## 2023-07-18 DIAGNOSIS — E1165 Type 2 diabetes mellitus with hyperglycemia: Secondary | ICD-10-CM

## 2023-07-18 NOTE — Telephone Encounter (Signed)
Chelsea Hudson from [employer], states that they sent over a fax regarding needing the pt active prescription list and has not received an update. Is asking if the medications below are active medications If yes, please send them to their pharmacy to be filled.  triamterene-hydrochlorothiazide (MAXZIDE-25) 37.5-25 MG tablet , lisinopril (ZESTRIL) 5 MG tablet, metFORMIN (GLUCOPHAGE) 500 MG tablet   Please advise.

## 2023-07-19 NOTE — Telephone Encounter (Signed)
Yes, these are active on the med list

## 2023-07-20 MED ORDER — METFORMIN HCL 500 MG PO TABS
ORAL_TABLET | ORAL | 0 refills | Status: DC
Start: 2023-07-20 — End: 2023-08-14

## 2023-07-20 MED ORDER — LISINOPRIL 5 MG PO TABS
5.0000 mg | ORAL_TABLET | Freq: Every day | ORAL | 0 refills | Status: DC
Start: 2023-07-20 — End: 2023-08-14

## 2023-07-20 MED ORDER — TRIAMTERENE-HCTZ 37.5-25 MG PO TABS
1.0000 | ORAL_TABLET | Freq: Every day | ORAL | 0 refills | Status: DC
Start: 2023-07-20 — End: 2023-08-14

## 2023-07-20 NOTE — Addendum Note (Signed)
Addended by: Kavin Leech E on: 07/20/2023 04:11 PM   Modules accepted: Orders

## 2023-07-20 NOTE — Telephone Encounter (Signed)
30 day supply has been sent to the pharmacy.  Pt needs to schedule an office visit before anymore refills.

## 2023-07-20 NOTE — Telephone Encounter (Signed)
Called back [facility].  They are needing a refill on the following medications.   triamterene-hydrochlorothiazide (MAXZIDE-25) 37.5-25 MG tablet  lisinopril (ZESTRIL) 5 MG tablet,  metFORMIN (GLUCOPHAGE) 500 MG tablet   [facility] call back: [phone] Fax number: [phone]

## 2023-07-31 ENCOUNTER — Other Ambulatory Visit: Payer: Self-pay | Admitting: Internal Medicine

## 2023-07-31 DIAGNOSIS — I10 Essential (primary) hypertension: Secondary | ICD-10-CM

## 2023-08-01 NOTE — Telephone Encounter (Signed)
Requested medication (s) are due for refill today:no  Requested medication (s) are on the active medication list: yes  Last refill:  07/20/23 #30 courtesy refill  Future visit scheduled: no  Notes to clinic:  Sent pt MyChart message to make appt for further refills.   Requested Prescriptions  Pending Prescriptions Disp Refills   triamterene-hydrochlorothiazide (MAXZIDE-25) 37.5-25 MG tablet [Pharmacy Med Name: Triamterene-HCTZ 37.5-25 MG Oral Tablet] 100 tablet 2    Sig: TAKE 1 TABLET BY MOUTH DAILY     Cardiovascular: Diuretic Combos Failed - 07/31/2023  5:20 AM      Failed - K in normal range and within 180 days    Potassium  Date Value Ref Range Status  05/16/2023 3.4 (L) 3.5 - 5.1 mmol/L Final         Passed - Na in normal range and within 180 days    Sodium  Date Value Ref Range Status  05/16/2023 136 135 - 145 mmol/L Final         Passed - Cr in normal range and within 180 days    Creat  Date Value Ref Range Status  12/26/2022 0.81 0.50 - 1.03 mg/dL Final   Creatinine, Ser  Date Value Ref Range Status  05/16/2023 0.87 0.44 - 1.00 mg/dL Final   Creatinine, Urine  Date Value Ref Range Status  12/26/2022 139 20 - 275 mg/dL Final         Passed - Last BP in normal range    BP Readings from Last 1 Encounters:  05/16/23 124/80         Passed - Valid encounter within last 6 months    Recent Outpatient Visits           7 months ago Encounter for general adult medical examination with abnormal findings   [place] [facility] [place], Salvadore [place], NP   1 year ago Controlled type 2 diabetes mellitus with hyperglycemia, without long-term current use of insulin [facility])   [facility] [place], Salvadore [place], NP   1 year ago Acute pain of right shoulder   [place] [facility] Smitty Cords, DO   1 year ago Encounter for general adult medical examination with abnormal findings   [place] [facility] [place], [place], NP   1 year ago Controlled type 2 diabetes mellitus with hyperglycemia, without long-term current use of insulin [facility])   [place] [facility] [place], Salvadore [place], [place]

## 2023-08-03 ENCOUNTER — Other Ambulatory Visit: Payer: Self-pay | Admitting: *Deleted

## 2023-08-03 ENCOUNTER — Telehealth: Payer: Self-pay | Admitting: Internal Medicine

## 2023-08-03 MED ORDER — LETROZOLE 2.5 MG PO TABS: 2.5000 mg | ORAL_TABLET | Freq: Every day | ORAL | 0 refills | Status: DC

## 2023-08-03 NOTE — Telephone Encounter (Signed)
Annabelle Harman with [facility] has called again in regards to a fax request that he states was sent over on 06/25/2023 and re-faxed again today, 08/03/2023 to fax # [phone] in regards to an active prescription list for the patient. Please see two CRM's in patient's chart regarding this and also Telephone encounter on 07/18/2023. Per Annabelle Harman this active prescription list needs to be faxed back to  # [phone].    Callback # direct line for [phone]

## 2023-08-13 ENCOUNTER — Other Ambulatory Visit: Payer: Self-pay | Admitting: *Deleted

## 2023-08-13 MED ORDER — LETROZOLE 2.5 MG PO TABS: 2.5000 mg | ORAL_TABLET | Freq: Every day | ORAL | 0 refills | Status: DC

## 2023-08-13 NOTE — Telephone Encounter (Signed)
Needs 2 week supply of letrozole sent to [facility] [address] Her other prescription from [facility] will be mailed on 10/7

## 2023-08-14 ENCOUNTER — Ambulatory Visit (INDEPENDENT_AMBULATORY_CARE_PROVIDER_SITE_OTHER): Payer: 59 | Admitting: Internal Medicine

## 2023-08-14 ENCOUNTER — Encounter: Payer: Self-pay | Admitting: Internal Medicine

## 2023-08-14 VITALS — BP 124/72 | HR 102 | Temp 95.3°F | Wt 214.0 lb

## 2023-08-14 DIAGNOSIS — E785 Hyperlipidemia, unspecified: Secondary | ICD-10-CM | POA: Diagnosis not present

## 2023-08-14 DIAGNOSIS — E1169 Type 2 diabetes mellitus with other specified complication: Secondary | ICD-10-CM

## 2023-08-14 DIAGNOSIS — Z6834 Body mass index (BMI) 34.0-34.9, adult: Secondary | ICD-10-CM

## 2023-08-14 DIAGNOSIS — E1165 Type 2 diabetes mellitus with hyperglycemia: Secondary | ICD-10-CM | POA: Diagnosis not present

## 2023-08-14 DIAGNOSIS — E6609 Other obesity due to excess calories: Secondary | ICD-10-CM | POA: Diagnosis not present

## 2023-08-14 DIAGNOSIS — M170 Bilateral primary osteoarthritis of knee: Secondary | ICD-10-CM | POA: Diagnosis not present

## 2023-08-14 DIAGNOSIS — G620 Drug-induced polyneuropathy: Secondary | ICD-10-CM

## 2023-08-14 DIAGNOSIS — Z853 Personal history of malignant neoplasm of breast: Secondary | ICD-10-CM

## 2023-08-14 DIAGNOSIS — E876 Hypokalemia: Secondary | ICD-10-CM | POA: Diagnosis not present

## 2023-08-14 DIAGNOSIS — I10 Essential (primary) hypertension: Secondary | ICD-10-CM

## 2023-08-14 DIAGNOSIS — D649 Anemia, unspecified: Secondary | ICD-10-CM

## 2023-08-14 DIAGNOSIS — T451X5A Adverse effect of antineoplastic and immunosuppressive drugs, initial encounter: Secondary | ICD-10-CM

## 2023-08-14 LAB — POCT GLYCOSYLATED HEMOGLOBIN (HGB A1C): HbA1c, POC (controlled diabetic range): 7.6 % — AB (ref 0.0–7.0)

## 2023-08-14 MED ORDER — LISINOPRIL 5 MG PO TABS
5.0000 mg | ORAL_TABLET | Freq: Every day | ORAL | 1 refills | Status: DC
Start: 2023-08-14 — End: 2024-02-19

## 2023-08-14 MED ORDER — METFORMIN HCL 500 MG PO TABS: ORAL_TABLET | ORAL | 1 refills | Status: DC

## 2023-08-14 MED ORDER — POTASSIUM CHLORIDE ER 10 MEQ PO TBCR
10.0000 meq | EXTENDED_RELEASE_TABLET | Freq: Two times a day (BID) | ORAL | 1 refills | Status: DC
Start: 2023-08-14 — End: 2024-02-19

## 2023-08-14 MED ORDER — ATORVASTATIN CALCIUM 10 MG PO TABS
10.0000 mg | ORAL_TABLET | Freq: Every day | ORAL | 1 refills | Status: DC
Start: 2023-08-14 — End: 2024-02-19

## 2023-08-14 MED ORDER — TRIAMTERENE-HCTZ 37.5-25 MG PO TABS: 1.0000 | ORAL_TABLET | Freq: Every day | ORAL | 1 refills | Status: DC

## 2023-08-14 NOTE — Assessment & Plan Note (Signed)
Controlled on triamterene HCTZ and lisinopril Reinforced DASH diet and exercise for weight loss C-Met today

## 2023-08-14 NOTE — Assessment & Plan Note (Signed)
Encouraged diet and exercise for weight loss ?

## 2023-08-14 NOTE — Patient Instructions (Signed)

## 2023-08-14 NOTE — Assessment & Plan Note (Signed)
In remission on letrozole She will continue to follow with oncology

## 2023-08-14 NOTE — Assessment & Plan Note (Signed)
CBC today Continue oral iron

## 2023-08-14 NOTE — Assessment & Plan Note (Signed)
Not medicated We will monitor

## 2023-08-14 NOTE — Assessment & Plan Note (Signed)
C-Met and lipid profile today Encouraged her to consume a low-fat diet Continue atorvastatin 

## 2023-08-14 NOTE — Progress Notes (Signed)
Subjective:    Patient ID: Chelsea Hudson, female    DOB: 04-Aug-1966, 57 y.o.   MRN: [id number]  HPI  Patient presents to clinic today for follow-up of chronic conditions.  HTN: Her BP today is 124/72.  She is taking triamterene HCT, lisinopril and potassium as prescribed.  ECG from 12/2017 reviewed.  OA: Mainly in her knees.  She takes Tylenol OTC with good relief of symptoms.  She does not follow with orthopedics.  History of right breast cancer: Status postmastectomy, lymph node excision, chemo and radiation.  She is taking letrozole as prescribed.  She follows with oncology.  Chemotherapy-induced neuropathy: She is not currently taking any medications for this.  She does not follow with neurology.  DM2: Her last A1c was 8.2%, 12/2022.  She is taking metformin as prescribed.  She does not check her sugars.  She checks her feet routinely.  Her last eye exam was 05/2022, scheduled 08/2023.  She does not take flu, Pneumovax or COVID vaccines.  HLD: Her last LDL was 48, triglycerides 86, 12/2022.  She denies myalgias on atorvastatin.  She does not consume a low-fat diet.  Anemia: Her last H/H was 10.6/34.1, 04/2023.  She is taking an oral iron.  She does not follow with hematology but does follow with oncology.   Review of Systems     Past Medical History:  Diagnosis Date   Arthritis    SHOULDER   Breast cancer (HCC) 02/2017   rt breast   Breast cancer (HCC) 2018   Cancer (HCC) 02/28/2017   INVASIVE MAMMARY CARCINOMA WITH MUCINOUS FEATURES.   Colonic diverticular abscess 06/21/2017   Colonoscopy 06/25/2017: No evidence of malignancy.   Diabetes mellitus without complication (HCC)    Hypertension    Irregular heart beat    PT STATES IT "SKIPS A BEAT"    Obesity    Personal history of chemotherapy    Personal history of radiation therapy     Current Outpatient Medications  Medication Sig Dispense Refill   atorvastatin (LIPITOR) 10 MG tablet Take 1 tablet (10 mg total) by  mouth daily. Please schedule an office visit before anymore refills. 30 tablet 0   baclofen (LIORESAL) 10 MG tablet Take 0.5-1 tablets (5-10 mg total) by mouth 2 (two) times daily as needed for muscle spasms. 30 each 1   letrozole (FEMARA) 2.5 MG tablet Take 1 tablet (2.5 mg total) by mouth daily. 14 tablet 0   lisinopril (ZESTRIL) 5 MG tablet Take 1 tablet (5 mg total) by mouth daily. Please schedule an office visit before anymore refills. 30 tablet 0   metFORMIN (GLUCOPHAGE) 500 MG tablet TAKE 2 TABLETS BY MOUTH ONCE  DAILY WITH BREAKFAST AND 1  TABLET WITH SUPPER Please schedule an office visit before anymore refills. 90 tablet 0   naproxen (NAPROSYN) 500 MG tablet Take 1 tablet (500 mg total) by mouth 2 (two) times daily with a meal. For 1-2 weeks then as needed (Patient not taking: Reported on 03/09/2023) 60 tablet 1   potassium chloride (KLOR-CON) 10 MEQ tablet Take 1 tablet (10 mEq total) by mouth 2 (two) times daily. Please schedule an office visit before anymore refills. 60 tablet 0   triamterene-hydrochlorothiazide (MAXZIDE-25) 37.5-25 MG tablet Take 1 tablet by mouth daily. Please schedule an office visit before anymore refills. 30 tablet 0   No current facility-administered medications for this visit.    Allergies  Allergen Reactions   Other Hives and Itching    Patient states  that she's allergic to an antibiotic but not sure which one. It was given to her for infection     Family History  Problem Relation Age of Onset   Diabetes Father    Stroke Father    Hypertension Father    Hypertension Mother    Brain cancer Maternal Aunt 26   Diabetes Sister    Colon cancer Neg Hx    Breast cancer Neg Hx     Social History   Socioeconomic History   Marital status: Married    Spouse name: Not on file   Number of children: Not on file   Years of education: Not on file   Highest education level: Not on file  Occupational History   Not on file  Tobacco Use   Smoking status:  Former    Current packs/day: 0.00    Average packs/day: 0.3 packs/day for 25.0 years (6.3 ttl pk-yrs)    Types: Cigarettes    Start date: 10/20/1992    Quit date: 10/20/2017    Years since quitting: 5.8   Smokeless tobacco: Former  Building services engineer status: Never Used  Substance and Sexual Activity   Alcohol use: Not Currently    Comment: occas   Drug use: No   Sexual activity: Not Currently  Other Topics Concern   Not on file  Social History Narrative   Not on file   Social Determinants of Health   Financial Resource Strain: Low Risk  (03/09/2023)   Overall Financial Resource Strain (CARDIA)    Difficulty of Paying Living Expenses: Not hard at all  Food Insecurity: No Food Insecurity (03/09/2023)   Hunger Vital Sign    Worried About Running Out of Food in the Last Year: Never true    Ran Out of Food in the Last Year: Never true  Transportation Needs: No Transportation Needs (03/09/2023)   PRAPARE - Administrator, Civil Service (Medical): No    Lack of Transportation (Non-Medical): No  Physical Activity: Inactive (03/09/2023)   Exercise Vital Sign    Days of Exercise per Week: 0 days    Minutes of Exercise per Session: 0 min  Stress: No Stress Concern Present (03/09/2023)   [employer] of Occupational Health - Occupational Stress Questionnaire    Feeling of Stress : Not at all  Social Connections: Moderately Integrated (03/09/2023)   Social Connection and Isolation Panel [NHANES]    Frequency of Communication with Friends and Family: More than three times a week    Frequency of Social Gatherings with Friends and Family: Once a week    Attends Religious Services: More than 4 times per year    Active Member of [employer] or Organizations: No    Attends Banker Meetings: Never    Marital Status: Married  Catering manager Violence: Not At Risk (03/09/2023)   Humiliation, Afraid, Rape, and Kick questionnaire    Fear of Current or Ex-Partner: No     Emotionally Abused: No    Physically Abused: No    Sexually Abused: No     Constitutional: Denies fever, malaise, fatigue, headache or abrupt weight changes.  HEENT: Denies eye pain, eye redness, ear pain, ringing in the ears, wax buildup, runny nose, nasal congestion, bloody nose, or sore throat. Respiratory: Denies difficulty breathing, shortness of breath, cough or sputum production.   Cardiovascular: Denies chest pain, chest tightness, palpitations or swelling in the hands or feet.  Gastrointestinal: Denies abdominal pain, bloating, constipation, diarrhea or  blood in the stool.  GU: Denies urgency, frequency, pain with urination, burning sensation, blood in urine, odor or discharge. Musculoskeletal: Patient reports knee pain.  Denies decrease in range of motion, difficulty with gait, muscle pain or joint swelling.  Skin: Denies redness, rashes, lesions or ulcercations.  Neurological: Patient reports neuropathic pain.  Denies dizziness, difficulty with memory, difficulty with speech or problems with balance and coordination.  Psych: Denies anxiety, depression, SI/HI.  No other specific complaints in a complete review of systems (except as listed in HPI above).  Objective:   Physical Exam BP 124/72 (BP Location: Left Arm, Patient Position: Sitting, Cuff Size: Normal)   Pulse (!) 102   Temp (!) 95.3 F (35.2 C) (Temporal)   Wt 214 lb (97.1 kg)   LMP 01/31/2014 (Approximate) Comment: LMP was 3 years ago.  SpO2 97%   BMI 34.54 kg/m    LMP 01/31/2014 (Approximate) Comment: LMP was 3 years ago. Wt Readings from Last 3 Encounters:  05/16/23 227 lb 3.2 oz (103.1 kg)  03/09/23 246 lb (111.6 kg)  12/26/22 246 lb (111.6 kg)    General: Appears her stated age, obese, in NAD. Skin: Warm, dry and intact. No ulcerations noted. HEENT: Head: normal shape and size; Eyes: sclera white, no icterus, conjunctiva pink, PERRLA and EOMs intact;  Cardiovascular: Tachycardiac with normal rhythm.  S1,S2 noted.  No murmur, rubs or gallops noted. No JVD.  Trace pitting controlled on dry BLE edema. No carotid bruits noted. Pulmonary/Chest: Normal effort and positive vesicular breath sounds. No respiratory distress. No wheezes, rales or ronchi noted.  Musculoskeletal: No difficulty with gait.  Neurological: Alert and oriented. Coordination normal.  Psychiatric: Mood and affect normal. Behavior is normal. Judgment and thought content normal.    BMET    Component Value Date/Time   NA 136 05/16/2023 1126   K 3.4 (L) 05/16/2023 1126   CL 101 05/16/2023 1126   CO2 25 05/16/2023 1126   GLUCOSE 227 (H) 05/16/2023 1126   BUN 14 05/16/2023 1126   CREATININE 0.87 05/16/2023 1126   CREATININE 0.81 12/26/2022 1530   CALCIUM 9.3 05/16/2023 1126   GFRNONAA >60 05/16/2023 1126   GFRNONAA 77 03/22/2020 0802   GFRAA 89 03/22/2020 0802    Lipid Panel     Component Value Date/Time   CHOL 97 12/26/2022 1530   CHOL 168 03/01/2021 1649   TRIG 86 12/26/2022 1530   HDL 32 (L) 12/26/2022 1530   HDL 33 (L) 03/01/2021 1649   CHOLHDL 3.0 12/26/2022 1530   VLDL 23 04/04/2017 0941   LDLCALC 48 12/26/2022 1530    CBC    Component Value Date/Time   WBC 7.5 05/16/2023 1126   RBC 4.39 05/16/2023 1126   HGB 10.6 (L) 05/16/2023 1126   HCT 34.1 (L) 05/16/2023 1126   PLT 327 05/16/2023 1126   MCV 77.7 (L) 05/16/2023 1126   MCH 24.1 (L) 05/16/2023 1126   MCHC 31.1 05/16/2023 1126   RDW 16.6 (H) 05/16/2023 1126   LYMPHSABS 1.6 05/16/2023 1126   MONOABS 0.3 05/16/2023 1126   EOSABS 0.1 05/16/2023 1126   BASOSABS 0.0 05/16/2023 1126    Hgb A1C Lab Results  Component Value Date   HGBA1C 8.2 (H) 12/26/2022           Assessment & Plan:      RTC in 6 months, follow-up chronic conditions Nicki Reaper, NP

## 2023-08-14 NOTE — Assessment & Plan Note (Signed)
Encourage weight loss as this can help reduce joint pain Continue Tylenol OTC as needed

## 2023-08-14 NOTE — Assessment & Plan Note (Signed)
POCT A1c 7.6% Urine microalbumin has been checked within the last year Encouraged her to consume a low-carb diet Continue metformin Encouraged routine eye exam Encouraged routine foot exam She declines immunizations

## 2023-08-15 ENCOUNTER — Inpatient Hospital Stay: Payer: 59

## 2023-08-16 ENCOUNTER — Inpatient Hospital Stay: Payer: 59 | Attending: Oncology

## 2023-08-20 ENCOUNTER — Encounter: Payer: Self-pay | Admitting: Oncology

## 2023-08-20 ENCOUNTER — Inpatient Hospital Stay: Payer: 59 | Admitting: Oncology

## 2023-08-20 ENCOUNTER — Other Ambulatory Visit: Payer: 59

## 2023-08-20 ENCOUNTER — Inpatient Hospital Stay: Payer: 59

## 2023-08-22 ENCOUNTER — Telehealth: Payer: Self-pay | Admitting: *Deleted

## 2023-08-22 NOTE — Telephone Encounter (Signed)
RN spoke with patient who stated [facility] on [address] in [place], [place] stated they were not able to dispense letrozole.  RN called and spoke with pharmacy who said insurance said it was too early.  Not due until 09/05/23.  RN stated that the script was only for 14 days and patient has been without medication and it was prescribed on 08/13/23.  Pharmacist reviewed and stated that patient had 30 tabs in June, 30 tabs in August and now requesting the 14 tabs on 08/13/23. Pharmacists stated she would contact insurance company.  RN called and notified patient of above and that hopefully pharmacy could get it worked out with insurance.  Instructed patient to go to or call pharmacy tomorrow and hopefully the issue would be resolved and if not to call back so we could notified MD.  Pt verbalized understanding.

## 2023-10-10 ENCOUNTER — Inpatient Hospital Stay: Payer: 59 | Attending: Oncology

## 2023-10-25 ENCOUNTER — Other Ambulatory Visit: Payer: Self-pay | Admitting: Internal Medicine

## 2023-10-25 ENCOUNTER — Encounter: Payer: Self-pay | Admitting: Internal Medicine

## 2023-10-25 ENCOUNTER — Ambulatory Visit: Payer: 59 | Admitting: Internal Medicine

## 2023-10-25 MED ORDER — GABAPENTIN 400 MG PO CAPS
400.0000 mg | ORAL_CAPSULE | Freq: Every day | ORAL | 0 refills | Status: DC
Start: 2023-10-25 — End: 2024-01-01

## 2023-10-25 NOTE — Progress Notes (Unsigned)
Subjective:    Patient ID: Chelsea Hudson, female    DOB: Jul 31, 1966, 57 y.o.   MRN: [id number]  HPI  Patient presents to clinic today for follow-up of chronic conditions.  HTN: Her BP today is 124/72.  She is taking triamterene HCT, lisinopril and potassium as prescribed.  ECG from 12/2017 reviewed.  OA: Mainly in her knees.  She takes Tylenol OTC with good relief of symptoms.  She does not follow with orthopedics.  History of right breast cancer: Status postmastectomy, lymph node excision, chemo and radiation.  She is taking letrozole as prescribed.  She follows with oncology.  Chemotherapy-induced neuropathy: She is not currently taking any medications for this.  She does not follow with neurology.  DM2: Her last A1c was 8.2%, 12/2022.  She is taking metformin as prescribed.  She does not check her sugars.  She checks her feet routinely.  Her last eye exam was 05/2022, scheduled 08/2023.  She does not take flu, Pneumovax or COVID vaccines.  HLD: Her last LDL was 48, triglycerides 86, 12/2022.  She denies myalgias on atorvastatin.  She does not consume a low-fat diet.  Anemia: Her last H/H was 10.6/34.1, 04/2023.  She is taking an oral iron.  She does not follow with hematology but does follow with oncology.   Review of Systems     Past Medical History:  Diagnosis Date   Arthritis    SHOULDER   Breast cancer (HCC) 02/2017   rt breast   Breast cancer (HCC) 2018   Cancer (HCC) 02/28/2017   INVASIVE MAMMARY CARCINOMA WITH MUCINOUS FEATURES.   Colonic diverticular abscess 06/21/2017   Colonoscopy 06/25/2017: No evidence of malignancy.   Diabetes mellitus without complication (HCC)    Hypertension    Irregular heart beat    PT STATES IT "SKIPS A BEAT"    Obesity    Personal history of chemotherapy    Personal history of radiation therapy     Current Outpatient Medications  Medication Sig Dispense Refill   atorvastatin (LIPITOR) 10 MG tablet Take 1 tablet (10 mg total) by  mouth daily. Please schedule an office visit before anymore refills. 90 tablet 1   letrozole (FEMARA) 2.5 MG tablet Take 1 tablet (2.5 mg total) by mouth daily. 14 tablet 0   lisinopril (ZESTRIL) 5 MG tablet Take 1 tablet (5 mg total) by mouth daily. Please schedule an office visit before anymore refills. 90 tablet 1   metFORMIN (GLUCOPHAGE) 500 MG tablet TAKE 2 TABLETS BY MOUTH ONCE  DAILY WITH BREAKFAST AND 1  TABLET WITH SUPPER Please schedule an office visit before anymore refills. 270 tablet 1   potassium chloride (KLOR-CON) 10 MEQ tablet Take 1 tablet (10 mEq total) by mouth 2 (two) times daily. Please schedule an office visit before anymore refills. 180 tablet 1   triamterene-hydrochlorothiazide (MAXZIDE-25) 37.5-25 MG tablet Take 1 tablet by mouth daily. Please schedule an office visit before anymore refills. 90 tablet 1   No current facility-administered medications for this visit.    Allergies  Allergen Reactions   Other Hives and Itching    [name]     Family History  Problem Relation Age of Onset   Diabetes Father    Stroke Father    Hypertension Father    Hypertension Mother    Brain cancer Maternal Aunt 55   Diabetes Sister  Colon cancer Neg Hx    Breast cancer Neg Hx     Social History   Socioeconomic History   Marital status: Married    Spouse name: Not on file   Number of children: Not on file   Years of education: Not on file   Highest education level: Not on file  Occupational History   Not on file  Tobacco Use   Smoking status: Former    Current packs/day: 0.00    Average packs/day: 0.3 packs/day for 25.0 years (6.3 ttl pk-yrs)    Types: Cigarettes    Start date: 10/20/1992    Quit date: 10/20/2017    Years since quitting: 6.0   Smokeless tobacco: Former  Building services engineer status: Never Used  Substance and Sexual Activity   Alcohol use: Not  Currently    Comment: occas   Drug use: No   Sexual activity: Not Currently  Other Topics Concern   Not on file  Social History Narrative   Not on file   Social Determinants of Health   Financial Resource Strain: Low Risk  (03/09/2023)   Overall Financial Resource Strain (CARDIA)    Difficulty of Paying Living Expenses: Not hard at all  Food Insecurity: No Food Insecurity (03/09/2023)   Hunger Vital Sign    Worried About Running Out of Food in the Last Year: Never true    Ran Out of Food in the Last Year: Never true  Transportation Needs: No Transportation Needs (03/09/2023)   PRAPARE - Administrator, Civil Service (Medical): No    Lack of Transportation (Non-Medical): No  Physical Activity: Inactive (03/09/2023)   Exercise Vital Sign    Days of Exercise per Week: 0 days    Minutes of Exercise per Session: 0 min  Stress: No Stress Concern Present (03/09/2023)   [employer] of Occupational Health - Occupational Stress Questionnaire    Feeling of Stress : Not at all  Social Connections: Moderately Integrated (03/09/2023)   Social Connection and Isolation Panel [NHANES]    Frequency of Communication with Friends and Family: More than three times a week    Frequency of Social Gatherings with Friends and Family: Once a week    Attends Religious Services: More than 4 times per year    Active Member of [employer] or Organizations: No    Attends Banker Meetings: Never    Marital Status: Married  Catering manager Violence: Not At Risk (03/09/2023)   Humiliation, Afraid, Rape, and Kick questionnaire    Fear of Current or Ex-Partner: No    Emotionally Abused: No    Physically Abused: No    Sexually Abused: No     Constitutional: Denies fever, malaise, fatigue, headache or abrupt weight changes.  HEENT: Denies eye pain, eye redness, ear pain, ringing in the ears, wax buildup, runny nose, nasal congestion, bloody nose, or sore throat. Respiratory: Denies  difficulty breathing, shortness of breath, cough or sputum production.   Cardiovascular: Denies chest pain, chest tightness, palpitations or swelling in the hands or feet.  Gastrointestinal: Denies abdominal pain, bloating, constipation, diarrhea or blood in the stool.  GU: Denies urgency, frequency, pain with urination, burning sensation, blood in urine, odor or discharge. Musculoskeletal: Patient reports knee pain.  Denies decrease in range of motion, difficulty with gait, muscle pain or joint swelling.  Skin: Denies redness, rashes, lesions or ulcercations.  Neurological: Patient reports neuropathic pain.  Denies dizziness, difficulty with memory, difficulty with speech or  problems with balance and coordination.  Psych: Denies anxiety, depression, SI/HI.  No other specific complaints in a complete review of systems (except as listed in HPI above).  Objective:   Physical Exam LMP 01/31/2014 (Approximate) Comment: LMP was 3 years ago.   LMP 01/31/2014 (Approximate) Comment: LMP was 3 years ago. Wt Readings from Last 3 Encounters:  08/14/23 214 lb (97.1 kg)  05/16/23 227 lb 3.2 oz (103.1 kg)  03/09/23 246 lb (111.6 kg)    General: Appears her stated age, obese, in NAD. Skin: Warm, dry and intact. No ulcerations noted. HEENT: Head: normal shape and size; Eyes: sclera white, no icterus, conjunctiva pink, PERRLA and EOMs intact;  Cardiovascular: Tachycardiac with normal rhythm. S1,S2 noted.  No murmur, rubs or gallops noted. No JVD.  Trace pitting controlled on dry BLE edema. No carotid bruits noted. Pulmonary/Chest: Normal effort and positive vesicular breath sounds. No respiratory distress. No wheezes, rales or ronchi noted.  Musculoskeletal: No difficulty with gait.  Neurological: Alert and oriented. Coordination normal.  Psychiatric: Mood and affect normal. Behavior is normal. Judgment and thought content normal.    BMET    Component Value Date/Time   NA 136 05/16/2023 1126   K  3.4 (L) 05/16/2023 1126   CL 101 05/16/2023 1126   CO2 25 05/16/2023 1126   GLUCOSE 227 (H) 05/16/2023 1126   BUN 14 05/16/2023 1126   CREATININE 0.87 05/16/2023 1126   CREATININE 0.81 12/26/2022 1530   CALCIUM 9.3 05/16/2023 1126   GFRNONAA >60 05/16/2023 1126   GFRNONAA 77 03/22/2020 0802   GFRAA 89 03/22/2020 0802    Lipid Panel     Component Value Date/Time   CHOL 97 12/26/2022 1530   CHOL 168 03/01/2021 1649   TRIG 86 12/26/2022 1530   HDL 32 (L) 12/26/2022 1530   HDL 33 (L) 03/01/2021 1649   CHOLHDL 3.0 12/26/2022 1530   VLDL 23 04/04/2017 0941   LDLCALC 48 12/26/2022 1530    CBC    Component Value Date/Time   WBC 7.5 05/16/2023 1126   RBC 4.39 05/16/2023 1126   HGB 10.6 (L) 05/16/2023 1126   HCT 34.1 (L) 05/16/2023 1126   PLT 327 05/16/2023 1126   MCV 77.7 (L) 05/16/2023 1126   MCH 24.1 (L) 05/16/2023 1126   MCHC 31.1 05/16/2023 1126   RDW 16.6 (H) 05/16/2023 1126   LYMPHSABS 1.6 05/16/2023 1126   MONOABS 0.3 05/16/2023 1126   EOSABS 0.1 05/16/2023 1126   BASOSABS 0.0 05/16/2023 1126    Hgb A1C Lab Results  Component Value Date   HGBA1C 7.6 (A) 08/14/2023           Assessment & Plan:      RTC in 3 months for your annual exam Nicki Reaper, NP

## 2023-11-05 LAB — HM DIABETES EYE EXAM

## 2023-11-06 DIAGNOSIS — Z853 Personal history of malignant neoplasm of breast: Secondary | ICD-10-CM | POA: Diagnosis not present

## 2023-11-06 DIAGNOSIS — Z79899 Other long term (current) drug therapy: Secondary | ICD-10-CM | POA: Diagnosis not present

## 2023-11-06 DIAGNOSIS — R0789 Other chest pain: Secondary | ICD-10-CM | POA: Diagnosis not present

## 2023-11-06 DIAGNOSIS — R000 Tachycardia, unspecified: Secondary | ICD-10-CM | POA: Diagnosis not present

## 2023-11-06 DIAGNOSIS — R079 Chest pain, unspecified: Secondary | ICD-10-CM | POA: Diagnosis not present

## 2023-11-06 DIAGNOSIS — Z1152 Encounter for screening for COVID-19: Secondary | ICD-10-CM | POA: Diagnosis not present

## 2023-11-06 DIAGNOSIS — E119 Type 2 diabetes mellitus without complications: Secondary | ICD-10-CM | POA: Diagnosis not present

## 2023-11-06 DIAGNOSIS — Z7984 Long term (current) use of oral hypoglycemic drugs: Secondary | ICD-10-CM | POA: Diagnosis not present

## 2023-11-06 DIAGNOSIS — I10 Essential (primary) hypertension: Secondary | ICD-10-CM | POA: Diagnosis not present

## 2023-11-07 DIAGNOSIS — R079 Chest pain, unspecified: Secondary | ICD-10-CM | POA: Diagnosis not present

## 2023-11-08 ENCOUNTER — Telehealth: Payer: Self-pay

## 2023-11-08 NOTE — Transitions of Care (Post Inpatient/ED Visit) (Signed)
   11/08/2023  Name: Chelsea Hudson MRN: [id number] DOB: 1966-09-04  Today's TOC FU Call Status: Today's TOC FU Call Status:: Unsuccessful Call (1st Attempt) Unsuccessful Call (1st Attempt) Date: 11/08/23  Attempted to reach the patient regarding the most recent Inpatient/ED visit.  Follow Up Plan: Additional outreach attempts will be made to reach the patient to complete the Transitions of Care (Post Inpatient/ED visit) call.    Belinda Bringhurst, CMA  [facility] Team Direct Dial: [phone]

## 2023-11-15 NOTE — Transitions of Care (Post Inpatient/ED Visit) (Signed)
   11/15/2023  Name: Chelsea Hudson MRN: [id number] DOB: [birth year]  Unable to make contact with patient to complete TOC call and have her scheduled within 7 days of discharge.   Weronika Birch, CMA  [facility] Team Direct Dial: [phone]

## 2023-11-28 ENCOUNTER — Ambulatory Visit: Payer: Self-pay

## 2023-11-28 NOTE — Telephone Encounter (Signed)
     Chief Complaint: Non-productive cough, SOB at night when laying down.  Symptoms: Above Frequency: 2 weeks Pertinent Negatives: Patient denies fever Disposition: [] ED /[] Urgent Care (no appt availability in office) / [x] Appointment(In office/virtual)/ []  [facility]/ [] Home Care/ [] Refused Recommended Disposition /[]  Mobile Bus/ []  Follow-up with PCP Additional Notes: Agrees with appointment.  Reason for Disposition  [1] Continuous (nonstop) coughing interferes with work or school AND [2] no improvement using cough treatment per Care Advice  Answer Assessment - Initial Assessment Questions 1. ONSET: When did the cough begin?      2 weeks 2. SEVERITY: How bad is the cough today?      Severe 3. SPUTUM: Describe the color of your sputum (none, dry cough; clear, white, yellow, green)     None 4. HEMOPTYSIS: Are you coughing up any blood? If so ask: How much? (flecks, streaks, tablespoons, etc.)     No 5. DIFFICULTY BREATHING: Are you having difficulty breathing? If Yes, ask: How bad is it? (e.g., mild, moderate, severe)    - MILD: No SOB at rest, mild SOB with walking, speaks normally in sentences, can lie down, no retractions, pulse < 100.    - MODERATE: SOB at rest, SOB with minimal exertion and prefers to sit, cannot lie down flat, speaks in phrases, mild retractions, audible wheezing, pulse 100-120.    - SEVERE: Very SOB at rest, speaks in single words, struggling to breathe, sitting hunched forward, retractions, pulse > 120      At bedtime 6. FEVER: Do you have a fever? If Yes, ask: What is your temperature, how was it measured, and when did it start?     No 7. CARDIAC HISTORY: Do you have any history of heart disease? (e.g., heart attack, congestive heart failure)      No 8. LUNG HISTORY: Do you have any history of lung disease?  (e.g., pulmonary embolus, asthma, emphysema)     NO 9. PE RISK FACTORS: Do you have a history of blood  clots? (or: recent major surgery, recent prolonged travel, bedridden)     No 10. OTHER SYMPTOMS: Do you have any other symptoms? (e.g., runny nose, wheezing, chest pain)       Runny nose, chest is tight 11. PREGNANCY: Is there any chance you are pregnant? When was your last menstrual period?       No 12. TRAVEL: Have you traveled out of the country in the last month? (e.g., travel history, exposures)       No  Protocols used: Cough - Acute Non-Productive-A-AH

## 2023-11-29 ENCOUNTER — Encounter: Payer: Self-pay | Admitting: Physician Assistant

## 2023-11-29 ENCOUNTER — Ambulatory Visit (INDEPENDENT_AMBULATORY_CARE_PROVIDER_SITE_OTHER): Payer: 59 | Admitting: Physician Assistant

## 2023-11-29 VITALS — BP 134/80 | HR 94 | Resp 16 | Ht 66.0 in | Wt 206.0 lb

## 2023-11-29 DIAGNOSIS — J069 Acute upper respiratory infection, unspecified: Secondary | ICD-10-CM | POA: Diagnosis not present

## 2023-11-29 MED ORDER — AMOXICILLIN-POT CLAVULANATE 875-125 MG PO TABS: 1.0000 | ORAL_TABLET | Freq: Two times a day (BID) | ORAL | 0 refills | Status: DC

## 2023-11-29 NOTE — Patient Instructions (Signed)
 VISIT SUMMARY:  You came in today with a worsening cough and congestion that has been ongoing for a couple of weeks. You also reported a fever, runny nose, postnasal drip, wheezing, headache, sore throat, and a sensation of fullness in one ear. These symptoms started after being exposed to your godchild who was diagnosed with pneumonia. You have been taking Nyquil without improvement and have a known allergy to an unspecified antibiotic, but have previously taken Augmentin  without any issues.  YOUR PLAN:  -UPPER RESPIRATORY INFECTION WITH POSSIBLE PNEUMONIA: An upper respiratory infection is an infection that affects the nose, throat, and airways. Given your symptoms and recent exposure to pneumonia, there is a possibility of pneumonia, which is a lung infection. Your lungs were clear on examination, but we are taking precautions. We have prescribed Augmentin , which is an antibiotic that can treat sinus infections, strep throat, and pneumonia. Please take it as directed and report any allergic reactions. Additionally, you should take Mucinex to help with mucus, Robitussin for cough relief, and Flonase  nasal spray to reduce nasal congestion. Your prescription has been sent to [facility].  INSTRUCTIONS:  Please start taking Augmentin  as prescribed and monitor for any allergic reactions. Use Mucinex, Robitussin, and Flonase  as advised to help alleviate your symptoms. If your symptoms do not improve or worsen, please schedule a follow-up appointment.

## 2023-11-29 NOTE — Progress Notes (Signed)
 Acute Office Visit   Patient: Chelsea Hudson   DOB: [birth year]   58 y.o. Female  MRN: [id number] Visit Date: 11/29/2023  Today's healthcare provider: Rocky BRAVO Chelsea Elman, PA-C  Introduced myself to the patient as a PA-C and provided education on APPs in clinical practice.    Chief Complaint  Patient presents with   Cough    Non-productive, x2 weeks. Getting worse   Subjective    HPI HPI     Cough    Additional comments: Non-productive, x2 weeks. Getting worse      Last edited by Dann Kirsch, CMA on 11/29/2023 10:11 AM.      Discussed the use of AI scribe software for clinical note transcription with the patient, who gave verbal consent to proceed.  History of Present Illness   The patient, with a history of cancer and mastectomy, presents with a worsening cough and congestion that has been ongoing for a couple of weeks. The symptoms started after exposure to a godchild who was diagnosed with pneumonia. The patient reports a fever of 101.67F, a runny nose, postnasal drip, and recent onset of wheezing. She also complains of a headache, a sore throat, and a sensation of fullness in one ear. The patient denies any body aches, nausea, vomiting, or diarrhea.  Over the past two days, the patient has been taking Nyquil to alleviate symptoms, but reports no improvement. She also has Mucinex at home but has not been taking it. The patient has a known allergy to an unspecified antibiotic, which caused a rash but no respiratory distress. She has previously taken and completed a course of Augmentin  without any adverse reactions.       Medications: Outpatient Medications Prior to Visit  Medication Sig   atorvastatin  (LIPITOR) 10 MG tablet Take 1 tablet (10 mg total) by mouth daily. Please schedule an office visit before anymore refills.   gabapentin  (NEURONTIN ) 400 MG capsule Take 1 capsule (400 mg total) by mouth at bedtime.   letrozole  (FEMARA ) 2.5 MG tablet Take 1 tablet (2.5 mg  total) by mouth daily.   lisinopril  (ZESTRIL ) 5 MG tablet Take 1 tablet (5 mg total) by mouth daily. Please schedule an office visit before anymore refills.   metFORMIN  (GLUCOPHAGE ) 500 MG tablet TAKE 2 TABLETS BY MOUTH ONCE  DAILY WITH BREAKFAST AND 1  TABLET WITH SUPPER Please schedule an office visit before anymore refills.   potassium chloride  (KLOR-CON ) 10 MEQ tablet Take 1 tablet (10 mEq total) by mouth 2 (two) times daily. Please schedule an office visit before anymore refills.   triamterene -hydrochlorothiazide (MAXZIDE-25) 37.5-25 MG tablet Take 1 tablet by mouth daily. Please schedule an office visit before anymore refills.   No facility-administered medications prior to visit.    Review of Systems  Constitutional:  Positive for fatigue and fever. Negative for chills.  HENT:  Positive for congestion, rhinorrhea, sinus pressure, sinus pain and sore throat. Negative for ear pain and postnasal drip.   Respiratory:  Positive for cough and wheezing. Negative for shortness of breath.   Gastrointestinal:  Negative for diarrhea, nausea and vomiting.  Musculoskeletal:  Negative for myalgias.  Neurological:  Positive for headaches. Negative for dizziness and light-headedness.        Objective    BP 134/80   Pulse 94   Resp 16   Ht 5' 6 (1.676 m)   Wt 206 lb (93.4 kg)   LMP 01/31/2014 (Approximate) Comment: LMP was 3  years ago.  SpO2 98%   BMI 33.25 kg/m     Physical Exam Vitals reviewed.  Constitutional:      General: She is awake.     Appearance: She is well-developed and well-groomed. She is ill-appearing.  HENT:     Nose: Congestion and rhinorrhea present.     Mouth/Throat:     Lips: Pink.     Pharynx: Uvula midline. Posterior oropharyngeal erythema present. No oropharyngeal exudate, uvula swelling or postnasal drip.  Eyes:     General: Allergic shiner present.  Cardiovascular:     Rate and Rhythm: Normal rate and regular rhythm.     Heart sounds: Normal heart  sounds.  Pulmonary:     Effort: Pulmonary effort is normal.     Breath sounds: Normal breath sounds. No decreased air movement. No decreased breath sounds, wheezing, rhonchi or rales.  Musculoskeletal:     Cervical back: Normal range of motion.  Lymphadenopathy:     Head:     Right side of head: No submental, submandibular or preauricular adenopathy.     Left side of head: No submental, submandibular or preauricular adenopathy.     Cervical:     Right cervical: No superficial cervical adenopathy.    Left cervical: No superficial cervical adenopathy.     Upper Body:     Right upper body: No supraclavicular adenopathy.     Left upper body: No supraclavicular adenopathy.  Neurological:     Mental Status: She is alert.  Psychiatric:        Behavior: Behavior is cooperative.       No results found for any visits on 11/29/23.  Assessment & Plan      No follow-ups on file.     Problem List Items Addressed This Visit   None Visit Diagnoses       Upper respiratory tract infection, unspecified type    -  Primary   Relevant Medications   amoxicillin -clavulanate (AUGMENTIN ) 875-125 MG tablet      Assessment and Plan    Upper Respiratory Infection with Possible Pneumonia Presents with a two-week history of cough, congestion, rhinorrhea, sore throat, and fever (101.64F). Reports wheezing, headache, and ear fullness. Recent exposure to pneumonia. Symptoms and history suggest possible upper respiratory infection with potential pneumonia. No asthma or COPD. Lungs clear on examination. Allergic reaction to an unspecified antibiotic (hives). Discussed Augmentin  for coverage of sinus infections, strep throat, and pneumonia. Informed about potential side effects and instructed to report any allergic reactions. - Prescribe Augmentin  - Advise Mucinex - Advise Robitussin - Recommend Flonase  nasal spray        No follow-ups on file.   I, Jasira Robinson E Dewayne Severe, PA-C, have reviewed all  documentation for this visit. The documentation on 11/29/23 for the exam, diagnosis, procedures, and orders are all accurate and complete.   [place], MHS, PA-C [facility]

## 2023-12-04 ENCOUNTER — Encounter: Payer: Self-pay | Admitting: Internal Medicine

## 2023-12-04 ENCOUNTER — Ambulatory Visit (INDEPENDENT_AMBULATORY_CARE_PROVIDER_SITE_OTHER): Payer: 59 | Admitting: Internal Medicine

## 2023-12-04 VITALS — BP 118/74 | Ht 66.0 in | Wt 198.8 lb

## 2023-12-04 DIAGNOSIS — H9192 Unspecified hearing loss, left ear: Secondary | ICD-10-CM | POA: Diagnosis not present

## 2023-12-04 NOTE — Progress Notes (Signed)
 Subjective:    Patient ID: Chelsea Hudson, female    DOB: [birth year], 58 y.o.   MRN: [id number]  HPI  Discussed the use of AI scribe software for clinical note transcription with the patient, who gave verbal consent to proceed.   The patient presents with a chief complaint of left ear discomfort, which has been ongoing for a couple of days. They describe the sensation as if the ear is stopped up, but deny any associated pain, ringing, or drainage. The patient has not noticed any other symptoms such as headache, runny nose, or nasal congestion.  The patient reports a recent episode of cough and fever, for which they sought medical attention at a hospital. They believe the cough was contracted from their goddaughter, who had been diagnosed with a cold. The patient was given medication for the cough, which they started three days prior to the current consultation. The cough is still present, but all other symptoms have resolved.  The patient has tried using a nasal spray as recommended by a healthcare provider, but found the sensation uncomfortable, likening it to drowning. They have since been using an over-the-counter product recommended by a friend, which contains ingredients found in cough drops. The patient has not had their hearing tested recently, but reports that they can only hear themselves in the affected ear.       Review of Systems   Past Medical History:  Diagnosis Date   Arthritis    SHOULDER   Breast cancer (HCC) 02/2017   rt breast   Breast cancer (HCC) 2018   Cancer (HCC) 02/28/2017   INVASIVE MAMMARY CARCINOMA WITH MUCINOUS FEATURES.   Colonic diverticular abscess 06/21/2017   Colonoscopy 06/25/2017: No evidence of malignancy.   Diabetes mellitus without complication (HCC)    Hypertension    Irregular heart beat    PT STATES IT SKIPS A BEAT    Obesity    Personal history of chemotherapy    Personal history of radiation therapy     Current Outpatient  Medications  Medication Sig Dispense Refill   amoxicillin -clavulanate (AUGMENTIN ) 875-125 MG tablet Take 1 tablet by mouth 2 (two) times daily. 20 tablet 0   atorvastatin  (LIPITOR) 10 MG tablet Take 1 tablet (10 mg total) by mouth daily. Please schedule an office visit before anymore refills. 90 tablet 1   gabapentin  (NEURONTIN ) 400 MG capsule Take 1 capsule (400 mg total) by mouth at bedtime. 30 capsule 0   letrozole  (FEMARA ) 2.5 MG tablet Take 1 tablet (2.5 mg total) by mouth daily. 14 tablet 0   lisinopril  (ZESTRIL ) 5 MG tablet Take 1 tablet (5 mg total) by mouth daily. Please schedule an office visit before anymore refills. 90 tablet 1   metFORMIN  (GLUCOPHAGE ) 500 MG tablet TAKE 2 TABLETS BY MOUTH ONCE  DAILY WITH BREAKFAST AND 1  TABLET WITH SUPPER Please schedule an office visit before anymore refills. 270 tablet 1   potassium chloride  (KLOR-CON ) 10 MEQ tablet Take 1 tablet (10 mEq total) by mouth 2 (two) times daily. Please schedule an office visit before anymore refills. 180 tablet 1   triamterene -hydrochlorothiazide (MAXZIDE-25) 37.5-25 MG tablet Take 1 tablet by mouth daily. Please schedule an office visit before anymore refills. 90 tablet 1   No current facility-administered medications for this visit.    Allergies  Allergen Reactions   Other Hives and Itching    Patient states that she's allergic to an antibiotic but not sure which one. It was given to her  for infection     Family History  Problem Relation Age of Onset   Diabetes Father    Stroke Father    Hypertension Father    Hypertension Mother    Brain cancer Maternal Aunt 71   Diabetes Sister    Colon cancer Neg Hx    Breast cancer Neg Hx     Social History   Socioeconomic History   Marital status: Married    Spouse name: Not on file   Number of children: Not on file   Years of education: Not on file   Highest education level: Not on file  Occupational History   Not on file  Tobacco Use   Smoking status:  Former    Current packs/day: 0.00    Average packs/day: 0.3 packs/day for 25.0 years (6.3 ttl pk-yrs)    Types: Cigarettes    Start date: 10/20/1992    Quit date: 10/20/2017    Years since quitting: 6.1   Smokeless tobacco: Former  Building Services Engineer status: Never Used  Substance and Sexual Activity   Alcohol use: Not Currently    Comment: occas   Drug use: No   Sexual activity: Not Currently  Other Topics Concern   Not on file  Social History Narrative   Not on file   Social Drivers of Health   Financial Resource Strain: Low Risk  (03/09/2023)   Overall Financial Resource Strain (CARDIA)    Difficulty of Paying Living Expenses: Not hard at all  Food Insecurity: No Food Insecurity (03/09/2023)   Hunger Vital Sign    Worried About Running Out of Food in the Last Year: Never true    Ran Out of Food in the Last Year: Never true  Transportation Needs: No Transportation Needs (03/09/2023)   PRAPARE - Administrator, Civil Service (Medical): No    Lack of Transportation (Non-Medical): No  Physical Activity: Inactive (03/09/2023)   Exercise Vital Sign    Days of Exercise per Week: 0 days    Minutes of Exercise per Session: 0 min  Stress: No Stress Concern Present (03/09/2023)   [employer] of Occupational Health - Occupational Stress Questionnaire    Feeling of Stress : Not at all  Social Connections: Moderately Integrated (03/09/2023)   Social Connection and Isolation Panel [NHANES]    Frequency of Communication with Friends and Family: More than three times a week    Frequency of Social Gatherings with Friends and Family: Once a week    Attends Religious Services: More than 4 times per year    Active Member of [employer] or Organizations: No    Attends Banker Meetings: Never    Marital Status: Married  Catering Manager Violence: Not At Risk (03/09/2023)   Humiliation, Afraid, Rape, and Kick questionnaire    Fear of Current or Ex-Partner: No     Emotionally Abused: No    Physically Abused: No    Sexually Abused: No     Constitutional: Denies fever, malaise, fatigue, headache or abrupt weight changes.  HEENT: Pt reports decreased hearing out of left ear. Denies eye pain, eye redness, ear pain, ringing in the ears, wax buildup, runny nose, nasal congestion, bloody nose, or sore throat. Respiratory: Pt reports cough. Denies difficulty breathing, shortness of breath, or sputum production.   Cardiovascular: Denies chest pain, chest tightness, palpitations or swelling in the hands or feet.  Gastrointestinal: Denies abdominal pain, bloating, constipation, diarrhea or blood in the stool.  GU:  Denies urgency, frequency, pain with urination, burning sensation, blood in urine, odor or discharge. Musculoskeletal: Denies decrease in range of motion, difficulty with gait, muscle pain or joint pain and swelling.  Skin: Denies redness, rashes, lesions or ulcercations.  Neurological: Denies dizziness, difficulty with memory, difficulty with speech or problems with balance and coordination.  Psych: Denies anxiety, depression, SI/HI.  No other specific complaints in a complete review of systems (except as listed in HPI above).      Objective:   Physical Exam  BP 118/74 (BP Location: Left Arm, Patient Position: Sitting, Cuff Size: Normal)   Ht 5' 6 (1.676 m)   Wt 198 lb 12.8 oz (90.2 kg)   LMP 01/31/2014 (Approximate) Comment: LMP was 3 years ago.  BMI 32.09 kg/m   Wt Readings from Last 3 Encounters:  11/29/23 206 lb (93.4 kg)  08/14/23 214 lb (97.1 kg)  05/16/23 227 lb 3.2 oz (103.1 kg)    General: Appears their stated age, well developed, well nourished in NAD. Skin: Warm, dry and intact. No rashes, lesions or ulcerations noted. HEENT: Head: normal shape and size; Eyes: sclera white, no icterus, conjunctiva pink, PERRLA and EOMs intact; Left Ear: Tm's gray and intact, normal light reflex; Right Ear: cerumen impaction Neck:  No  adenopathy noted. Cardiovascular: Normal rate and rhythm. S1,S2 noted.  No murmur, rubs or gallops noted. Pulmonary/Chest: Normal effort and positive vesicular breath sounds. No respiratory distress. No wheezes, rales or ronchi noted.  Neurological: Alert and oriented.   BMET    Component Value Date/Time   NA 136 05/16/2023 1126   K 3.4 (L) 05/16/2023 1126   CL 101 05/16/2023 1126   CO2 25 05/16/2023 1126   GLUCOSE 227 (H) 05/16/2023 1126   BUN 14 05/16/2023 1126   CREATININE 0.87 05/16/2023 1126   CREATININE 0.81 12/26/2022 1530   CALCIUM  9.3 05/16/2023 1126   GFRNONAA >60 05/16/2023 1126   GFRNONAA 77 03/22/2020 0802   GFRAA 89 03/22/2020 0802    Lipid Panel     Component Value Date/Time   CHOL 97 12/26/2022 1530   CHOL 168 03/01/2021 1649   TRIG 86 12/26/2022 1530   HDL 32 (L) 12/26/2022 1530   HDL 33 (L) 03/01/2021 1649   CHOLHDL 3.0 12/26/2022 1530   VLDL 23 04/04/2017 0941   LDLCALC 48 12/26/2022 1530    CBC    Component Value Date/Time   WBC 7.5 05/16/2023 1126   RBC 4.39 05/16/2023 1126   HGB 10.6 (L) 05/16/2023 1126   HCT 34.1 (L) 05/16/2023 1126   PLT 327 05/16/2023 1126   MCV 77.7 (L) 05/16/2023 1126   MCH 24.1 (L) 05/16/2023 1126   MCHC 31.1 05/16/2023 1126   RDW 16.6 (H) 05/16/2023 1126   LYMPHSABS 1.6 05/16/2023 1126   MONOABS 0.3 05/16/2023 1126   EOSABS 0.1 05/16/2023 1126   BASOSABS 0.0 05/16/2023 1126    Hgb A1C Lab Results  Component Value Date   HGBA1C 7.6 (A) 08/14/2023            Assessment & Plan:   Assessment and Plan    Left Ear Fullness Likely secondary to eustachian tube dysfunction, possibly related to recent upper respiratory infection. No signs of infection or other abnormalities on examination. -Start Flonase  (blue top) twice daily for one week to promote eustachian tube drainage. -Perform hearing test today to assess baseline hearing status. -If no improvement, consider referral to ENT.   RTC in 2 months for  follow-up of chronic  conditions Angeline Laura, NP

## 2023-12-14 DIAGNOSIS — E1165 Type 2 diabetes mellitus with hyperglycemia: Secondary | ICD-10-CM | POA: Diagnosis not present

## 2023-12-14 DIAGNOSIS — Z7984 Long term (current) use of oral hypoglycemic drugs: Secondary | ICD-10-CM | POA: Diagnosis not present

## 2023-12-14 DIAGNOSIS — Z0001 Encounter for general adult medical examination with abnormal findings: Secondary | ICD-10-CM | POA: Diagnosis not present

## 2023-12-31 ENCOUNTER — Other Ambulatory Visit: Payer: Self-pay | Admitting: Internal Medicine

## 2024-01-01 NOTE — Telephone Encounter (Signed)
Requested Prescriptions  Pending Prescriptions Disp Refills   gabapentin (NEURONTIN) 400 MG capsule [Pharmacy Med Name: Gabapentin 400 MG Oral Capsule] 60 capsule 0    Sig: TAKE 1 CAPSULE BY MOUTH AT  BEDTIME     Neurology: Anticonvulsants - gabapentin Passed - 01/01/2024  4:01 PM      Passed - Cr in normal range and within 360 days    Creat  Date Value Ref Range Status  12/26/2022 0.81 0.50 - 1.03 mg/dL Final   Creatinine, Ser  Date Value Ref Range Status  05/16/2023 0.87 0.44 - 1.00 mg/dL Final   Creatinine, Urine  Date Value Ref Range Status  12/26/2022 139 20 - 275 mg/dL Final         Passed - Completed PHQ-2 or PHQ-9 in the last 360 days      Passed - Valid encounter within last 12 months    Recent Outpatient Visits           4 weeks ago Decreased hearing of left ear   [facility] [facility] [place], Salvadore Oxford, NP   1 month ago Upper respiratory tract infection, unspecified type   [facility] M[place]um, Oswaldo Conroy, PA-C   4 months ago Controlled type 2 diabetes mellitus with hyperglycemia, without long-term current use of insulin [facility])   [place] [facility] [place], Salvadore Oxford, NP   1 year ago Encounter for general adult medical examination with abnormal findings   [place] [facility] [place], [place] W, NP   1 year ago Controlled type 2 diabetes mellitus with hyperglycemia, without long-term current use of insulin [facility])   [place] [facility] [place], Salvadore Oxford, NP       Future Appointments             In 1 month [place], Salvadore Oxford, NP [place] [facility]

## 2024-01-25 ENCOUNTER — Other Ambulatory Visit: Payer: Self-pay | Admitting: Oncology

## 2024-01-28 ENCOUNTER — Other Ambulatory Visit: Payer: Self-pay | Admitting: Internal Medicine

## 2024-01-29 ENCOUNTER — Encounter: Payer: Self-pay | Admitting: Oncology

## 2024-01-29 ENCOUNTER — Inpatient Hospital Stay (HOSPITAL_BASED_OUTPATIENT_CLINIC_OR_DEPARTMENT_OTHER): Admitting: Oncology

## 2024-01-29 ENCOUNTER — Inpatient Hospital Stay: Attending: Oncology

## 2024-01-29 VITALS — BP 126/75 | HR 115 | Temp 98.6°F | Resp 18 | Ht 65.0 in | Wt 194.8 lb

## 2024-01-29 DIAGNOSIS — C50411 Malignant neoplasm of upper-outer quadrant of right female breast: Secondary | ICD-10-CM | POA: Insufficient documentation

## 2024-01-29 DIAGNOSIS — Z9011 Acquired absence of right breast and nipple: Secondary | ICD-10-CM | POA: Diagnosis not present

## 2024-01-29 DIAGNOSIS — Z95828 Presence of other vascular implants and grafts: Secondary | ICD-10-CM

## 2024-01-29 DIAGNOSIS — Z170 Estrogen receptor positive status [ER+]: Secondary | ICD-10-CM

## 2024-01-29 DIAGNOSIS — E876 Hypokalemia: Secondary | ICD-10-CM | POA: Diagnosis not present

## 2024-01-29 DIAGNOSIS — Z79811 Long term (current) use of aromatase inhibitors: Secondary | ICD-10-CM | POA: Insufficient documentation

## 2024-01-29 DIAGNOSIS — Z79899 Other long term (current) drug therapy: Secondary | ICD-10-CM | POA: Diagnosis not present

## 2024-01-29 DIAGNOSIS — D649 Anemia, unspecified: Secondary | ICD-10-CM | POA: Diagnosis not present

## 2024-01-29 DIAGNOSIS — Z1732 Human epidermal growth factor receptor 2 negative status: Secondary | ICD-10-CM | POA: Diagnosis not present

## 2024-01-29 DIAGNOSIS — D638 Anemia in other chronic diseases classified elsewhere: Secondary | ICD-10-CM

## 2024-01-29 DIAGNOSIS — Z853 Personal history of malignant neoplasm of breast: Secondary | ICD-10-CM

## 2024-01-29 DIAGNOSIS — Z1721 Progesterone receptor positive status: Secondary | ICD-10-CM | POA: Insufficient documentation

## 2024-01-29 LAB — FOLATE: Folate: 9 ng/mL (ref >5.9)

## 2024-01-29 LAB — CBC WITH DIFFERENTIAL (CANCER CENTER ONLY)
Abs Immature Granulocytes: 0.07 10*3/uL (ref 0.00–0.07)
Basophils Absolute: 0 10*3/uL (ref 0.0–0.1)
Basophils Relative: 0 %
Eosinophils Absolute: 0.1 10*3/uL (ref 0.0–0.5)
Eosinophils Relative: 1 %
HCT: 32 % — ABNORMAL LOW (ref 36.0–46.0)
Hemoglobin: 10 g/dL — ABNORMAL LOW (ref 12.0–15.0)
Immature Granulocytes: 1 %
Lymphocytes Relative: 22 %
Lymphs Abs: 1.4 10*3/uL (ref 0.7–4.0)
MCH: 24.3 pg — ABNORMAL LOW (ref 26.0–34.0)
MCHC: 31.3 g/dL (ref 30.0–36.0)
MCV: 77.9 fL — ABNORMAL LOW (ref 80.0–100.0)
Monocytes Absolute: 0.3 10*3/uL (ref 0.1–1.0)
Monocytes Relative: 5 %
Neutro Abs: 4.4 10*3/uL (ref 1.7–7.7)
Neutrophils Relative %: 71 %
Platelet Count: 295 10*3/uL (ref 150–400)
RBC: 4.11 MIL/uL (ref 3.87–5.11)
RDW: 15.7 % — ABNORMAL HIGH (ref 11.5–15.5)
WBC Count: 6.3 10*3/uL (ref 4.0–10.5)
nRBC: 0 % (ref 0.0–0.2)

## 2024-01-29 LAB — CMP (CANCER CENTER ONLY)
ALT: 22 U/L (ref 0–44)
AST: 22 U/L (ref 15–41)
Albumin: 3.1 g/dL — ABNORMAL LOW (ref 3.5–5.0)
Alkaline Phosphatase: 114 U/L (ref 38–126)
Anion gap: 9 (ref 5–15)
BUN: 15 mg/dL (ref 6–20)
CO2: 25 mmol/L (ref 22–32)
Calcium: 9 mg/dL (ref 8.9–10.3)
Chloride: 104 mmol/L (ref 98–111)
Creatinine: 0.81 mg/dL (ref 0.44–1.00)
GFR, Estimated: >60 mL/min (ref >60)
Glucose, Bld: 229 mg/dL — ABNORMAL HIGH (ref 70–99)
Potassium: 3.3 mmol/L — ABNORMAL LOW (ref 3.5–5.1)
Sodium: 138 mmol/L (ref 135–145)
Total Bilirubin: 0.2 mg/dL (ref 0.0–1.2)
Total Protein: 6.9 g/dL (ref 6.5–8.1)

## 2024-01-29 LAB — IRON AND TIBC
Iron: 35 ug/dL (ref 28–170)
Saturation Ratios: 14 % (ref 10.4–31.8)
TIBC: 249 ug/dL — ABNORMAL LOW (ref 250–450)
UIBC: 214 ug/dL

## 2024-01-29 LAB — RETIC PANEL
Immature Retic Fract: 11.2 % (ref 2.3–15.9)
RBC.: 4.09 MIL/uL (ref 3.87–5.11)
Retic Count, Absolute: 63.4 10*3/uL (ref 19.0–186.0)
Retic Ct Pct: 1.6 % (ref 0.4–3.1)
Reticulocyte Hemoglobin: 26.7 pg — ABNORMAL LOW (ref >27.9)

## 2024-01-29 LAB — VITAMIN B12: Vitamin B-12: 238 pg/mL (ref 180–914)

## 2024-01-29 LAB — FERRITIN: Ferritin: 224 ng/mL (ref 11–307)

## 2024-01-29 MED ORDER — SODIUM CHLORIDE 0.9% FLUSH
10.0000 mL | Freq: Once | INTRAVENOUS | Status: AC
  Administered 2024-01-29: 10 mL via INTRAVENOUS
  Filled 2024-01-29: qty 10

## 2024-01-29 MED ORDER — LETROZOLE 2.5 MG PO TABS: 2.5000 mg | ORAL_TABLET | Freq: Every day | ORAL | 1 refills | Status: DC

## 2024-01-29 MED ORDER — HEPARIN SOD (PORK) LOCK FLUSH 100 UNIT/ML IV SOLN
500.0000 [IU] | Freq: Once | INTRAVENOUS | Status: AC
  Administered 2024-01-29: 500 [IU] via INTRAVENOUS
  Filled 2024-01-29: qty 5

## 2024-01-29 NOTE — Progress Notes (Signed)
 Hematology/Oncology Progress note [phone] [phone]     Clinic Day:  01/29/2024   Referring physician: Lorre Munroe, NP  Assessment & Plan:   Malignant neoplasm of upper-outer quadrant of right breast in female, estrogen receptor positive (HCC) History of right breast cancer[ 2018] S/p right mastectomy, letrozole since 05/2018 Clinically, she is doing well.  Continue annual screen mammogram of left breast - March 2025 Labs are reviewed and discussed with patient. CA 27.29 is stable and WNL Continue Letrozole 2.5mg  daily.   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   Aromatase inhibitor use Bone density on 05/04/2021 was normal. Plan repeat Recommend patient to take calcium 1200mg  and vitamin D supplementation.   Port-A-Cath in place Continue port flush Q6-8 weeks.  She prefers to keep medi port for now as she is interested in having a discussion with plastic surgeon for right breast re-construction. She plans to get both procedure at one time.  Refer to plastic surgeon  Normocytic anemia Lab Results  Component Value Date   HGB 10.0 (L) 01/29/2024   TIBC 249 (L) 01/29/2024   IRONPCTSAT 14 01/29/2024   FERRITIN 224 01/29/2024   Iron panel is not typical for iron deficiency.  Hb has decreased. Normal folate, B12 level is in low 200s. Recommend B12 supplementation daily.  Check myeloma panel, light chain ratio.   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  Continue potassium supplementation.    The patient understands the plans discussed today and is in agreement with them.  She knows to contact our office if she develops concerns prior to her next appointment.  Follow up 6 months.    Rickard Patience, MD  Orders Placed This Encounter  Procedures   DG Bone Density    Standing Status:   Future    Expected Date:   02/05/2024    Expiration Date:   01/28/2025    Reason for Exam (SYMPTOM  OR DIAGNOSIS REQUIRED):    on AI    Is patient pregnant?:   No    Preferred imaging location?:   [facility]   MM 3D SCREENING MAMMOGRAM UNILATERAL LEFT BREAST    Standing Status:   Future    Expected Date:   02/12/2024    Expiration Date:   01/28/2025    Reason for Exam (SYMPTOM  OR DIAGNOSIS REQUIRED):   history of breast cancer    Preferred imaging location?:   [facility]    Is the patient pregnant?:   No   Ambulatory referral to Plastic Surgery    Referral Priority:   Routine    Referral Type:   Surgical    Referral Reason:   Specialty Services Required    Referred to Provider:   Peggye Form, DO    Requested Specialty:   Plastic Surgery    Number of Visits Requested:   1      Chief complaints: follow up for history of breast cancer.   HPI:  Patient previously followed up by Dr.Corcoran, patient switched care to me on 05/09/22 Extensive medical record review was performed by me  - stage IIIC invasive carcinoma of the upper outer quadrant of the right breast s/p neoadjuvant chemotherapy, surgery, and radiation.  Biopsy on 02/28/2017 revealed invasive carcinoma with mucinous features.  Lymph node biopsy revealed metastatic disease.  Tumor was ER positive (90%), PR positive (90%) and HER-2/neu negative.  She had clinical stage T3N3aM0 breast cancer.   PET scan on 03/09/2017 revealed hypermetabolic  right axillary/subpectoral adenopathy.  There was low-grade activity in the right upper breast likely at the postoperative site.  Appearance was compatible with metastatic spread to right axillary/subpectoral lymph nodes.  There was some sigmoid colon diverticulosis, with faint inflammatory findings adjacent to the sigmoid colon proximally, and with accentuated activity in the involved segment of the sigmoid colon.    Partial mastectomy and sentinel lymph node biopsy on 07/30/2017 revealed a 1.8 cm invasive mammary carcinoma with mucinous features.  There was lymphovascular invasion.  Eight of 12  lymph nodes were positive for metastatic disease.   She required multiple excisions to obtain clear margins resulting in a full mastectomy on 09/28/2017.  She underwent complete axillary node dissection on 03/04/2018.  Level 2 and 3 lymph node dissection revealed 3 of 3 lymph nodes positive for carcinoma.  The largest tumor deposit was at least 20 mm.  Extracapsular extension was present < 1 mm beyond the lymph node capsule.   She received 4 cycles of AC (03/22/2017 - 05/02/2017) and 7 of 12 cycles of neoadjuvant Taxol (05/16/2017 - 06/27/2017).  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  She received adjuvant radiation at [facility].  She began Letrozole in 05/2018.  Patient reports feeling well. Occasionally she has sharp pain at the previous mastectomy site. Otherwise she has no new complaints.  She tolerates Letrozole, with no side effects.  01/18/22 left screening mammogram negative.    INTERVAL HISTORY Chelsea Hudson is a 58 y.o. female who has above history reviewed by me today presents for follow up visit for history of Stage III right breast cancer.   She lost follow up after her last visit on 6/206/2024.  She ran out of Letrozole and scheduled her follow up  She takes letrozole, tolerates well, with manageable side effects.  + occasional pain around her right mastectomy site, intermittent, usually triggered with stretching.  She has no new breast concerns.   Review of Systems  Constitutional:  Negative for appetite change, chills, fatigue and fever.  HENT:   Negative for hearing loss and voice change.   Eyes:  Negative for eye problems.  Respiratory:  Negative for chest tightness and cough.   Cardiovascular:  Negative for chest pain.  Gastrointestinal:  Negative for abdominal distention, abdominal pain and blood in stool.  Endocrine: Negative for hot flashes.  Genitourinary:  Negative for difficulty urinating and frequency.   Musculoskeletal:  Negative for  arthralgias.  Skin:  Negative for itching and rash.  Neurological:  Negative for extremity weakness.  Hematological:  Negative for adenopathy.  Psychiatric/Behavioral:  Negative for confusion.    VITALS Blood pressure 126/75, pulse (!) 115, temperature 98.6 F (37 C), resp. rate 18, height 5\' 5"  (1.651 m), weight 194 lb 12.8 oz (88.4 kg), last menstrual period 01/31/2014, SpO2 98%.  Wt Readings from Last 3 Encounters:  01/29/24 194 lb 12.8 oz (88.4 kg)  12/04/23 198 lb 12.8 oz (90.2 kg)  11/29/23 206 lb (93.4 kg)    Body mass index is 32.42 kg/m.  Performance status (ECOG): 0 - Asymptomatic  Physical Exam Constitutional:      General: She is not in acute distress.    Appearance: She is not diaphoretic.  HENT:     Head: Normocephalic and atraumatic.  Eyes:     General: No scleral icterus. Cardiovascular:     Rate and Rhythm: Normal rate and regular rhythm.  Pulmonary:     Effort: Pulmonary effort is normal. No respiratory distress.  Abdominal:     General: There is no distension.     Palpations: Abdomen is soft.  Musculoskeletal:        General: Normal range of motion.     Cervical back: Normal range of motion and neck supple.  Skin:    General: Skin is warm and dry.     Findings: No erythema.  Neurological:     Mental Status: She is alert and oriented to person, place, and time. Mental status is at baseline.     Motor: No abnormal muscle tone.  Psychiatric:        Mood and Affect: Mood and affect normal.     LABS:      Latest Ref Rng & Units 01/29/2024    1:15 PM 05/16/2023   11:26 AM 12/26/2022    3:30 PM  CBC  WBC 4.0 - 10.5 K/uL 6.3  7.5  8.2   Hemoglobin 12.0 - 15.0 g/dL 16.1  09.6  04.5   Hematocrit 36.0 - 46.0 % 32.0  34.1  33.1   Platelets 150 - 400 K/uL 295  327  414       Latest Ref Rng & Units 01/29/2024    1:15 PM 05/16/2023   11:26 AM 12/26/2022    3:30 PM  CMP  Glucose 70 - 99 mg/dL 409  811  914   BUN 6 - 20 mg/dL 15  14  18    Creatinine 0.44  - 1.00 mg/dL 7.82  9.56  2.13   Sodium 135 - 145 mmol/L 138  136  137   Potassium 3.5 - 5.1 mmol/L 3.3  3.4  3.9   Chloride 98 - 111 mmol/L 104  101  103   CO2 22 - 32 mmol/L 25  25  27    Calcium 8.9 - 10.3 mg/dL 9.0  9.3  9.2   Total Protein 6.5 - 8.1 g/dL 6.9  7.4  6.7   Total Bilirubin 0.0 - 1.2 mg/dL 0.2  0.3  0.2   Alkaline Phos 38 - 126 U/L 114  100    AST 15 - 41 U/L 22  20  16    ALT 0 - 44 U/L 22  21  24      STUDIES:  No results found.    HISTORY:   Past Medical History:  Diagnosis Date   Arthritis    SHOULDER   Breast cancer (HCC) 02/2017   rt breast   Breast cancer (HCC) 2018   Cancer (HCC) 02/28/2017   INVASIVE MAMMARY CARCINOMA WITH MUCINOUS FEATURES.   Colonic diverticular abscess 06/21/2017   Colonoscopy 06/25/2017: No evidence of malignancy.   Diabetes mellitus without complication (HCC)    Hypertension    Irregular heart beat    PT STATES IT "SKIPS A BEAT"    Obesity    Personal history of chemotherapy    Personal history of radiation therapy     Past Surgical History:  Procedure Laterality Date   ANKLE SURGERY     BREAST BIOPSY Right 02/28/2017   INVASIVE MAMMARY CARCINOMA WITH MUCINOUS FEATURES.   BREAST CYST ASPIRATION Right    NEG   COLONOSCOPY WITH PROPOFOL N/A 06/25/2017   Procedure: COLONOSCOPY WITH PROPOFOL;  Surgeon: Wyline Mood, MD;  Location: [facility] ENDOSCOPY;  Service: Gastroenterology;  Laterality: N/A;   MASTECTOMY, PARTIAL Right 07/30/2017   Procedure: MASTECTOMY PARTIAL;  Surgeon: Earline Mayotte, MD;  Location: [facility] ORS;  Service: General;  Laterality: Right;   PORTACATH PLACEMENT Left 03/15/2017  Procedure: INSERTION PORT-A-CATH;  Surgeon: Earline Mayotte, MD;  Location: [facility] ORS;  Service: General;  Laterality: Left;   RE-EXCISION OF BREAST LUMPECTOMY Right 08/17/2017    INVASIVE CARCINOMA EXTENDS TO NEW LATERAL MARGIN. /RE-EXCISION OF BREAST LUMPECTOMY;: Byrnett, Merrily Pew, MD;  [facility] ORS; General;  Laterality: Right;   SENTINEL  NODE BIOPSY Right 07/30/2017   Procedure: SENTINEL NODE BIOPSY;  Surgeon: Earline Mayotte, MD;  Location: [facility] ORS;  Service: General;  Laterality: Right;   SIMPLE MASTECTOMY WITH AXILLARY SENTINEL NODE BIOPSY Right 09/28/2017   Procedure: SIMPLE MASTECTOMY;  Surgeon: Earline Mayotte, MD;  Location: [facility] ORS;  Service: General;  Laterality: Right;    Family History  Problem Relation Age of Onset   Diabetes Father    Stroke Father    Hypertension Father    Hypertension Mother    Brain cancer Maternal Aunt 52   Diabetes Sister    Colon cancer Neg Hx    Breast cancer Neg Hx     Social History:  reports that she quit smoking about 6 years ago. Her smoking use included cigarettes. She started smoking about 31 years ago. She has a 6.3 pack-year smoking history. She has quit using smokeless tobacco. She reports that she does not currently use alcohol. She reports that she does not use drugs.  Allergies:  Allergies  Allergen Reactions   Other Hives and Itching    [name]     Current Medications: Current Outpatient Medications  Medication Sig Dispense Refill   atorvastatin (LIPITOR) 10 MG tablet Take 1 tablet (10 mg total) by mouth daily. Please schedule an office visit before anymore refills. 90 tablet 1   Calcium Carbonate-Vitamin D (CALTRATE 600+D PO) Take by mouth.     ferrous sulfate 325 (65 FE) MG tablet Take 325 mg by mouth daily with breakfast.     lisinopril (ZESTRIL) 5 MG tablet Take 1 tablet (5 mg total) by mouth daily. Please schedule an office visit before anymore refills. 90 tablet 1   metFORMIN (GLUCOPHAGE) 500 MG tablet TAKE 2 TABLETS BY MOUTH ONCE  DAILY WITH BREAKFAST AND 1  TABLET WITH SUPPER Please schedule an office visit before anymore refills. 270 tablet 1   potassium chloride (KLOR-CON) 10 MEQ tablet Take 1 tablet (10 mEq total) by mouth 2 (two) times daily.  Please schedule an office visit before anymore refills. 180 tablet 1   triamterene-hydrochlorothiazide (MAXZIDE-25) 37.5-25 MG tablet Take 1 tablet by mouth daily. Please schedule an office visit before anymore refills. 90 tablet 1   gabapentin (NEURONTIN) 400 MG capsule TAKE 1 CAPSULE BY MOUTH AT  BEDTIME 180 capsule 0   letrozole (FEMARA) 2.5 MG tablet Take 1 tablet (2.5 mg total) by mouth daily. 90 tablet 1   No current facility-administered medications for this visit.

## 2024-01-29 NOTE — Assessment & Plan Note (Addendum)
 Lab Results  Component Value Date   HGB 10.0 (L) 01/29/2024   TIBC 249 (L) 01/29/2024   IRONPCTSAT 14 01/29/2024   FERRITIN 224 01/29/2024   Iron panel is not typical for iron deficiency.  Hb has decreased. Normal folate, B12 level is in low 200s. Recommend B12 supplementation daily.  Check myeloma panel, light chain ratio.

## 2024-01-29 NOTE — Assessment & Plan Note (Addendum)
 History of right breast cancer[ 2018] S/p right mastectomy, letrozole since 05/2018 Clinically, she is doing well.  Continue annual screen mammogram of left breast - March 2025 Labs are reviewed and discussed with patient. CA 27.29 is stable and WNL Continue Letrozole 2.5mg  daily.   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

## 2024-01-29 NOTE — Telephone Encounter (Signed)
 Requested Prescriptions  Pending Prescriptions Disp Refills   gabapentin (NEURONTIN) 400 MG capsule [Pharmacy Med Name: Gabapentin 400 MG Oral Capsule] 60 capsule 5    Sig: TAKE 1 CAPSULE BY MOUTH AT  BEDTIME     Neurology: Anticonvulsants - gabapentin Passed - 01/29/2024  4:51 PM      Passed - Cr in normal range and within 360 days    Creatinine  Date Value Ref Range Status  01/29/2024 0.81 0.44 - 1.00 mg/dL Final   Creat  Date Value Ref Range Status  12/26/2022 0.81 0.50 - 1.03 mg/dL Final   Creatinine, Urine  Date Value Ref Range Status  12/26/2022 139 20 - 275 mg/dL Final         Passed - Completed PHQ-2 or PHQ-9 in the last 360 days      Passed - Valid encounter within last 12 months    Recent Outpatient Visits           1 month ago Decreased hearing of left ear   [facility] [place], Salvadore Oxford, NP   2 months ago Upper respiratory tract infection, unspecified type   [facility], Oswaldo Conroy, PA-C   5 months ago Controlled type 2 diabetes mellitus with hyperglycemia, without long-term current use of insulin [facility])   [place] [facility] [place], Salvadore Oxford, NP   1 year ago Encounter for general adult medical examination with abnormal findings   [place] [facility] [place], [place] W, NP   1 year ago Controlled type 2 diabetes mellitus with hyperglycemia, without long-term current use of insulin [facility])   [place] [facility], Salvadore Oxford, [place]

## 2024-01-29 NOTE — Assessment & Plan Note (Signed)
 Chronic problem for her.  Continue potassium supplementation.

## 2024-01-29 NOTE — Assessment & Plan Note (Addendum)
 Continue port flush Q6-8 weeks.  She prefers to keep medi port for now as she is interested in having a discussion with plastic surgeon for right breast re-construction. She plans to get both procedure at one time.  Refer to plastic surgeon

## 2024-01-29 NOTE — Assessment & Plan Note (Addendum)
 Bone density on 05/04/2021 was normal. Plan repeat Recommend patient to take calcium 1200mg  and vitamin D supplementation.

## 2024-01-30 ENCOUNTER — Telehealth: Payer: Self-pay | Admitting: Oncology

## 2024-01-30 NOTE — Telephone Encounter (Signed)
 [facility] called and RX refill on this patient. Dr Bethanne Ginger patient [phone] Triage was notified.

## 2024-02-03 MED ORDER — VITAMIN B-12 1000 MCG PO TABS: 1000.0000 ug | ORAL_TABLET | Freq: Every day | ORAL | 1 refills | Status: DC

## 2024-02-04 ENCOUNTER — Ambulatory Visit
Admission: RE | Admit: 2024-02-04 | Discharge: 2024-02-04 | Disposition: A | Discharge status: Home / Self Care | Source: Ambulatory Visit | Attending: Oncology

## 2024-02-04 ENCOUNTER — Telehealth: Payer: Self-pay

## 2024-02-04 ENCOUNTER — Ambulatory Visit
Admission: RE | Admit: 2024-02-04 | Discharge: 2024-02-04 | Disposition: A | Discharge status: Home / Self Care | Source: Ambulatory Visit | Attending: Oncology | Admitting: Oncology

## 2024-02-04 ENCOUNTER — Other Ambulatory Visit: Payer: Self-pay

## 2024-02-04 DIAGNOSIS — Z170 Estrogen receptor positive status [ER+]: Secondary | ICD-10-CM | POA: Insufficient documentation

## 2024-02-04 DIAGNOSIS — Z79811 Long term (current) use of aromatase inhibitors: Secondary | ICD-10-CM | POA: Diagnosis not present

## 2024-02-04 DIAGNOSIS — Z780 Asymptomatic menopausal state: Secondary | ICD-10-CM | POA: Insufficient documentation

## 2024-02-04 DIAGNOSIS — Z13820 Encounter for screening for osteoporosis: Secondary | ICD-10-CM | POA: Diagnosis not present

## 2024-02-04 DIAGNOSIS — Z1231 Encounter for screening mammogram for malignant neoplasm of breast: Secondary | ICD-10-CM | POA: Insufficient documentation

## 2024-02-04 DIAGNOSIS — C50411 Malignant neoplasm of upper-outer quadrant of right female breast: Secondary | ICD-10-CM | POA: Insufficient documentation

## 2024-02-04 MED ORDER — VITAMIN B-12 1000 MCG PO TABS: 1000.0000 ug | ORAL_TABLET | Freq: Every day | ORAL | 1 refills | Status: DC

## 2024-02-04 NOTE — Telephone Encounter (Signed)
 Called patient and informed her that Dr. Cathie Hoops recommends that she start B12 daily and follow-up in 6 months with labs prior to MD appointment. I told patient that I will have Morrie Sheldon schedule appointments and notify her with dates and times. Patient gave verbal understanding to this.

## 2024-02-04 NOTE — Telephone Encounter (Signed)
-----   Message from Rickard Patience sent at 02/03/2024  1:49 PM EDT ----- Please recommend patient to start B12 daily. Rx sent.  Follow up in 6 months lab prior to MD. Labs are ordered. Thanks.

## 2024-02-05 ENCOUNTER — Ambulatory Visit: Admitting: Plastic Surgery

## 2024-02-05 ENCOUNTER — Encounter: Payer: Self-pay | Admitting: Plastic Surgery

## 2024-02-05 VITALS — BP 122/65 | HR 99 | Ht 66.0 in | Wt 190.6 lb

## 2024-02-05 DIAGNOSIS — Z923 Personal history of irradiation: Secondary | ICD-10-CM | POA: Diagnosis not present

## 2024-02-05 DIAGNOSIS — N651 Disproportion of reconstructed breast: Secondary | ICD-10-CM

## 2024-02-05 DIAGNOSIS — Z853 Personal history of malignant neoplasm of breast: Secondary | ICD-10-CM

## 2024-02-05 DIAGNOSIS — N6489 Other specified disorders of breast: Secondary | ICD-10-CM | POA: Insufficient documentation

## 2024-02-05 NOTE — Progress Notes (Signed)
 Patient ID: Chelsea Hudson, female    DOB: 09-19-1966, 58 y.o.   MRN: [id number]   Chief Complaint  Patient presents with   Consult    The patient is a 58 year old female here for evaluation of her breast.  She had breast cancer that was treated with a mastectomy and radiation which ended 5 years ago.  She also had ankle surgery.  She has diabetes, hypertension and obesity.  She was able to decrease her weight significantly in the past couple of years.  She is now 5 feet 6 inches tall and weighs 190 pounds.  The patient states she had been evaluated at [facility] for Diep flap but needed to lose weight as she was closer to 230 pounds at the time.  Patient states that that surgeon moved to [place].  She is not smoking and has been tobacco free for the past 5 years.  She has a double DD cup size on the left breast.  The right chest wall is very tight with some skin discoloration due to the radiation.     Review of Systems  Constitutional: Negative.   HENT: Negative.    Eyes: Negative.   Respiratory: Negative.    Cardiovascular: Negative.   Gastrointestinal: Negative.   Endocrine: Negative.   Genitourinary: Negative.   Musculoskeletal: Negative.     Past Medical History:  Diagnosis Date   Arthritis    SHOULDER   Breast cancer (HCC) 02/2017   rt breast   Breast cancer (HCC) 2018   Cancer (HCC) 02/28/2017   INVASIVE MAMMARY CARCINOMA WITH MUCINOUS FEATURES.   Colonic diverticular abscess 06/21/2017   Colonoscopy 06/25/2017: No evidence of malignancy.   Diabetes mellitus without complication (HCC)    Hypertension    Irregular heart beat    PT STATES IT "SKIPS A BEAT"    Obesity    Personal history of chemotherapy    Personal history of radiation therapy     Past Surgical History:  Procedure Laterality Date   ANKLE SURGERY     BREAST BIOPSY Right 02/28/2017   INVASIVE MAMMARY CARCINOMA WITH MUCINOUS FEATURES.   BREAST CYST ASPIRATION Right    NEG   COLONOSCOPY WITH  PROPOFOL N/A 06/25/2017   Procedure: COLONOSCOPY WITH PROPOFOL;  Surgeon: Wyline Mood, MD;  [facility] ENDOSCOPY;  Service: Gastroenterology;  Laterality: N/A;   MASTECTOMY, PARTIAL Right 07/30/2017   Procedure: MASTECTOMY PARTIAL;  Surgeon: Earline Mayotte, MD;  Location: [facility] ORS;  Service: General;  Laterality: Right;   PORTACATH PLACEMENT Left 03/15/2017   Procedure: INSERTION PORT-A-CATH;  Surgeon: Earline Mayotte, MD;  Location: [facility] ORS;  Service: General;  Laterality: Left;   RE-EXCISION OF BREAST LUMPECTOMY Right 08/17/2017    INVASIVE CARCINOMA EXTENDS TO NEW LATERAL MARGIN. /RE-EXCISION OF BREAST LUMPECTOMY;: Byrnett, Merrily Pew, MD;  [facility] ORS; General;  Laterality: Right;   SENTINEL NODE BIOPSY Right 07/30/2017   Procedure: SENTINEL NODE BIOPSY;  Surgeon: Earline Mayotte, MD;  Location: [facility] ORS;  Service: General;  Laterality: Right;   SIMPLE MASTECTOMY WITH AXILLARY SENTINEL NODE BIOPSY Right 09/28/2017   Procedure: SIMPLE MASTECTOMY;  Surgeon: Earline Mayotte, MD;  Location: [facility] ORS;  Service: General;  Laterality: Right;      Current Outpatient Medications:    atorvastatin (LIPITOR) 10 MG tablet, Take 1 tablet (10 mg total) by mouth daily. Please schedule an office visit before anymore refills., Disp: 90 tablet, Rfl: 1   Calcium Carbonate-Vitamin D (CALTRATE 600+D PO), Take  by mouth., Disp: , Rfl:    cyanocobalamin (VITAMIN B12) 1000 MCG tablet, Take 1 tablet (1,000 mcg total) by mouth daily., Disp: 90 tablet, Rfl: 1   ferrous sulfate 325 (65 FE) MG tablet, Take 325 mg by mouth daily with breakfast., Disp: , Rfl:    gabapentin (NEURONTIN) 400 MG capsule, TAKE 1 CAPSULE BY MOUTH AT  BEDTIME, Disp: 180 capsule, Rfl: 0   letrozole (FEMARA) 2.5 MG tablet, Take 1 tablet (2.5 mg total) by mouth daily., Disp: 90 tablet, Rfl: 1   lisinopril (ZESTRIL) 5 MG tablet, Take 1 tablet (5 mg total) by mouth daily. Please schedule an office visit before anymore refills., Disp: 90 tablet,  Rfl: 1   metFORMIN (GLUCOPHAGE) 500 MG tablet, TAKE 2 TABLETS BY MOUTH ONCE  DAILY WITH BREAKFAST AND 1  TABLET WITH SUPPER Please schedule an office visit before anymore refills., Disp: 270 tablet, Rfl: 1   potassium chloride (KLOR-CON) 10 MEQ tablet, Take 1 tablet (10 mEq total) by mouth 2 (two) times daily. Please schedule an office visit before anymore refills., Disp: 180 tablet, Rfl: 1   triamterene-hydrochlorothiazide (MAXZIDE-25) 37.5-25 MG tablet, Take 1 tablet by mouth daily. Please schedule an office visit before anymore refills., Disp: 90 tablet, Rfl: 1   Objective:   Vitals:   02/05/24 0812  BP: 122/65  Pulse: 99  SpO2: 97%    Physical Exam Vitals reviewed.  Constitutional:      Appearance: Normal appearance.  HENT:     Head: Atraumatic.  Cardiovascular:     Rate and Rhythm: Normal rate.     Pulses: Normal pulses.  Pulmonary:     Effort: Pulmonary effort is normal.  Abdominal:     General: There is no distension.     Palpations: Abdomen is soft.  Musculoskeletal:        General: No swelling or tenderness.  Skin:    General: Skin is warm.     Capillary Refill: Capillary refill takes less than 2 seconds.     Coloration: Skin is not jaundiced.     Findings: No bruising.  Neurological:     Mental Status: She is alert and oriented to person, place, and time.  Psychiatric:        Mood and Affect: Mood normal.        Behavior: Behavior normal.        Thought Content: Thought content normal.        Judgment: Judgment normal.     Assessment & Plan:  History of breast cancer  Postoperative breast asymmetry  We talked about the different forms of reconstruction.  Implant only reconstruction is not a good option for this patient due to the radiation and the tightness of that right chest wall.  Diep flap might be an option.  I did told her that I cannot promise her it is but we can certainly have her get more information.  The other option is a latissimus flap.  The  patient would like to find out about the Diep flap first. Information sent to [facility].    Chelsea Bills Teri Diltz, DO

## 2024-02-08 ENCOUNTER — Other Ambulatory Visit: Payer: Self-pay | Admitting: Oncology

## 2024-02-11 ENCOUNTER — Encounter: Payer: Self-pay | Admitting: Internal Medicine

## 2024-02-11 ENCOUNTER — Ambulatory Visit (INDEPENDENT_AMBULATORY_CARE_PROVIDER_SITE_OTHER): Payer: 59 | Admitting: Internal Medicine

## 2024-02-11 VITALS — BP 124/62 | Ht 66.0 in | Wt 193.2 lb

## 2024-02-11 DIAGNOSIS — Z0001 Encounter for general adult medical examination with abnormal findings: Secondary | ICD-10-CM

## 2024-02-11 DIAGNOSIS — E1165 Type 2 diabetes mellitus with hyperglycemia: Secondary | ICD-10-CM

## 2024-02-11 DIAGNOSIS — E6609 Other obesity due to excess calories: Secondary | ICD-10-CM

## 2024-02-11 DIAGNOSIS — E66811 Obesity, class 1: Secondary | ICD-10-CM

## 2024-02-11 DIAGNOSIS — Z7984 Long term (current) use of oral hypoglycemic drugs: Secondary | ICD-10-CM

## 2024-02-11 DIAGNOSIS — Z6831 Body mass index (BMI) 31.0-31.9, adult: Secondary | ICD-10-CM

## 2024-02-11 NOTE — Patient Instructions (Signed)
 Health Maintenance for Postmenopausal Women Menopause is a normal process in which your ability to get pregnant comes to an end. This process happens slowly over many months or years, usually between the ages of 24 and 62. Menopause is complete when you have missed your menstrual period for 12 months. It is important to talk with your health care provider about some of the most common conditions that affect women after menopause (postmenopausal women). These include heart disease, cancer, and bone loss (osteoporosis). Adopting a healthy lifestyle and getting preventive care can help to promote your health and wellness. The actions you take can also lower your chances of developing some of these common conditions. What are the signs and symptoms of menopause? During menopause, you may have the following symptoms: Hot flashes. These can be moderate or severe. Night sweats. Decrease in sex drive. Mood swings. Headaches. Tiredness (fatigue). Irritability. Memory problems. Problems falling asleep or staying asleep. Talk with your health care provider about treatment options for your symptoms. Do I need hormone replacement therapy? Hormone replacement therapy is effective in treating symptoms that are caused by menopause, such as hot flashes and night sweats. Hormone replacement carries certain risks, especially as you become older. If you are thinking about using estrogen or estrogen with progestin, discuss the benefits and risks with your health care provider. How can I reduce my risk for heart disease and stroke? The risk of heart disease, heart attack, and stroke increases as you age. One of the causes may be a change in the body's hormones during menopause. This can affect how your body uses dietary fats, triglycerides, and cholesterol. Heart attack and stroke are medical emergencies. There are many things that you can do to help prevent heart disease and stroke. Watch your blood pressure High  blood pressure causes heart disease and increases the risk of stroke. This is more likely to develop in people who have high blood pressure readings or are overweight. Have your blood pressure checked: Every 3-5 years if you are 50-75 years of age. Every year if you are 77 years old or older. Eat a healthy diet  Eat a diet that includes plenty of vegetables, fruits, low-fat dairy products, and lean protein. Do not eat a lot of foods that are high in solid fats, added sugars, or sodium. Get regular exercise Get regular exercise. This is one of the most important things you can do for your health. Most adults should: Try to exercise for at least 150 minutes each week. The exercise should increase your heart rate and make you sweat (moderate-intensity exercise). Try to do strengthening exercises at least twice each week. Do these in addition to the moderate-intensity exercise. Spend less time sitting. Even light physical activity can be beneficial. Other tips Work with your health care provider to achieve or maintain a healthy weight. Do not use any products that contain nicotine or tobacco. These products include cigarettes, chewing tobacco, and vaping devices, such as e-cigarettes. If you need help quitting, ask your health care provider. Know your numbers. Ask your health care provider to check your cholesterol and your blood sugar (glucose). Continue to have your blood tested as directed by your health care provider. Do I need screening for cancer? Depending on your health history and family history, you may need to have cancer screenings at different stages of your life. This may include screening for: Breast cancer. Cervical cancer. Lung cancer. Colorectal cancer. What is my risk for osteoporosis? After menopause, you may be  at increased risk for osteoporosis. Osteoporosis is a condition in which bone destruction happens more quickly than new bone creation. To help prevent osteoporosis or  the bone fractures that can happen because of osteoporosis, you may take the following actions: If you are 61-3 years old, get at least 1,000 mg of calcium and at least 600 international units (IU) of vitamin D per day. If you are older than age 61 but younger than age 75, get at least 1,200 mg of calcium and at least 600 international units (IU) of vitamin D per day. If you are older than age 62, get at least 1,200 mg of calcium and at least 800 international units (IU) of vitamin D per day. Smoking and drinking excessive alcohol increase the risk of osteoporosis. Eat foods that are rich in calcium and vitamin D, and do weight-bearing exercises several times each week as directed by your health care provider. How does menopause affect my mental health? Depression may occur at any age, but it is more common as you become older. Common symptoms of depression include: Feeling depressed. Changes in sleep patterns. Changes in appetite or eating patterns. Feeling an overall lack of motivation or enjoyment of activities that you previously enjoyed. Frequent crying spells. Talk with your health care provider if you think that you are experiencing any of these symptoms. General instructions See your health care provider for regular wellness exams and vaccines. This may include: Scheduling regular health, dental, and eye exams. Getting and maintaining your vaccines. These include: Influenza vaccine. Get this vaccine each year before the flu season begins. Pneumonia vaccine. Shingles vaccine. Tetanus, diphtheria, and pertussis (Tdap) booster vaccine. Your health care provider may also recommend other immunizations. Tell your health care provider if you have ever been abused or do not feel safe at home. Summary Menopause is a normal process in which your ability to get pregnant comes to an end. This condition causes hot flashes, night sweats, decreased interest in sex, mood swings, headaches, or lack  of sleep. Treatment for this condition may include hormone replacement therapy. Take actions to keep yourself healthy, including exercising regularly, eating a healthy diet, watching your weight, and checking your blood pressure and blood sugar levels. Get screened for cancer and depression. Make sure that you are up to date with all your vaccines. This information is not intended to replace advice given to you by your health care provider. Make sure you discuss any questions you have with your health care provider. Document Revised: 03/28/2021 Document Reviewed: 03/28/2021 Elsevier Patient Education  2024 [employer].

## 2024-02-11 NOTE — Progress Notes (Signed)
 Subjective:    Patient ID: Chelsea Hudson, female    DOB: 1966/01/03, 58 y.o.   MRN: [id number]  HPI  Patient presents to clinic today for her annual exam.  Flu: Never Tetanus: >10 years ago COVID: Never Pneumovax:: Never Shingrix: Never Pap smear: 11/2021 Mammogram: 01/2024 Bone density: 01/2024 Colon screening: 06/2017 Vision screening: annually Dentist: biannually  Diet: She does eat meat. She consumes fruits and veggies. She does eat fried foods. She drinks mostly water, tea. Exercise: None   Review of Systems     Past Medical History:  Diagnosis Date   Arthritis    SHOULDER   Breast cancer (HCC) 02/2017   rt breast   Breast cancer (HCC) 2018   Cancer (HCC) 02/28/2017   INVASIVE MAMMARY CARCINOMA WITH MUCINOUS FEATURES.   Colonic diverticular abscess 06/21/2017   Colonoscopy 06/25/2017: No evidence of malignancy.   Diabetes mellitus without complication (HCC)    Hypertension    Irregular heart beat    PT STATES IT "SKIPS A BEAT"    Obesity    Personal history of chemotherapy    Personal history of radiation therapy     Current Outpatient Medications  Medication Sig Dispense Refill   atorvastatin (LIPITOR) 10 MG tablet Take 1 tablet (10 mg total) by mouth daily. Please schedule an office visit before anymore refills. 90 tablet 1   Calcium Carbonate-Vitamin D (CALTRATE 600+D PO) Take by mouth.     cyanocobalamin (VITAMIN B12) 1000 MCG tablet Take 1 tablet (1,000 mcg total) by mouth daily. 90 tablet 1   ferrous sulfate 325 (65 FE) MG tablet Take 325 mg by mouth daily with breakfast.     gabapentin (NEURONTIN) 400 MG capsule TAKE 1 CAPSULE BY MOUTH AT  BEDTIME 180 capsule 0   letrozole (FEMARA) 2.5 MG tablet Take 1 tablet (2.5 mg total) by mouth daily. 90 tablet 1   lisinopril (ZESTRIL) 5 MG tablet Take 1 tablet (5 mg total) by mouth daily. Please schedule an office visit before anymore refills. 90 tablet 1   metFORMIN (GLUCOPHAGE) 500 MG tablet TAKE 2 TABLETS  BY MOUTH ONCE  DAILY WITH BREAKFAST AND 1  TABLET WITH SUPPER Please schedule an office visit before anymore refills. 270 tablet 1   potassium chloride (KLOR-CON) 10 MEQ tablet Take 1 tablet (10 mEq total) by mouth 2 (two) times daily. Please schedule an office visit before anymore refills. 180 tablet 1   triamterene-hydrochlorothiazide (MAXZIDE-25) 37.5-25 MG tablet Take 1 tablet by mouth daily. Please schedule an office visit before anymore refills. 90 tablet 1   No current facility-administered medications for this visit.    Allergies  Allergen Reactions   Other Hives and Itching    [name]     Family History  Problem Relation Age of Onset   Diabetes Father    Stroke Father    Hypertension Father    Hypertension Mother    Brain cancer Maternal Aunt 67   Diabetes Sister    Colon cancer Neg Hx    Breast cancer Neg Hx     Social History   Socioeconomic History   Marital status: Married    Spouse name: Not on file   Number of children: Not on file   Years of education: Not on file   Highest education level: Not on file  Occupational History   Not on file  Tobacco  Use   Smoking status: Former    Current packs/day: 0.00    Average packs/day: 0.3 packs/day for 25.0 years (6.3 ttl pk-yrs)    Types: Cigarettes    Start date: 10/20/1992    Quit date: 10/20/2017    Years since quitting: 6.3   Smokeless tobacco: Former  Building services engineer status: Never Used  Substance and Sexual Activity   Alcohol use: Not Currently    Comment: occas   Drug use: No   Sexual activity: Not Currently  Other Topics Concern   Not on file  Social History Narrative   Not on file   Social Drivers of Health   Financial Resource Strain: Low Risk  (03/09/2023)   Overall Financial Resource Strain (CARDIA)    Difficulty of Paying Living Expenses: Not hard at all  Food Insecurity: No Food  Insecurity (03/09/2023)   Hunger Vital Sign    Worried About Running Out of Food in the Last Year: Never true    Ran Out of Food in the Last Year: Never true  Transportation Needs: No Transportation Needs (03/09/2023)   PRAPARE - Administrator, Civil Service (Medical): No    Lack of Transportation (Non-Medical): No  Physical Activity: Inactive (03/09/2023)   Exercise Vital Sign    Days of Exercise per Week: 0 days    Minutes of Exercise per Session: 0 min  Stress: No Stress Concern Present (03/09/2023)   [employer] of Occupational Health - Occupational Stress Questionnaire    Feeling of Stress : Not at all  Social Connections: Moderately Integrated (03/09/2023)   Social Connection and Isolation Panel [NHANES]    Frequency of Communication with Friends and Family: More than three times a week    Frequency of Social Gatherings with Friends and Family: Once a week    Attends Religious Services: More than 4 times per year    Active Member of [employer] or Organizations: No    Attends Banker Meetings: Never    Marital Status: Married  Catering manager Violence: Not At Risk (03/09/2023)   Humiliation, Afraid, Rape, and Kick questionnaire    Fear of Current or Ex-Partner: No    Emotionally Abused: No    Physically Abused: No    Sexually Abused: No     Constitutional: Denies fever, malaise, fatigue, headache or abrupt weight changes.  HEENT: Denies eye pain, eye redness, ear pain, ringing in the ears, wax buildup, runny nose, nasal congestion, bloody nose, or sore throat. Respiratory: Denies difficulty breathing, shortness of breath, cough or sputum production.   Cardiovascular: Denies chest pain, chest tightness, palpitations or swelling in the hands or feet.  Gastrointestinal: Pt reports loose stools. Denies abdominal pain, bloating, constipation, or blood in the stool.  GU: Denies urgency, frequency, pain with urination, burning sensation, blood in urine, odor or  discharge. Musculoskeletal: Patient reports intermittent joint pain.  Denies decrease in range of motion, difficulty with gait, muscle pain or joint swelling.  Skin: Denies redness, rashes, lesions or ulcercations.  Neurological: Patient reports neuropathic pain.  Denies dizziness, difficulty with memory, difficulty with speech or problems with balance and coordination.  Psych: Denies anxiety, depression, SI/HI.  No other specific complaints in a complete review of systems (except as listed in HPI above).  Objective:   Physical Exam  BP 124/62 (BP Location: Left Arm, Patient Position: Sitting, Cuff Size: Normal)   Ht 5\' 6"  (1.676 m)   Wt 193 lb 3.2 oz (87.6 kg)  LMP 01/31/2014 (Approximate) Comment: LMP was 3 years ago.  BMI 31.18 kg/m    Wt Readings from Last 3 Encounters:  02/05/24 190 lb 9.6 oz (86.5 kg)  01/29/24 194 lb 12.8 oz (88.4 kg)  12/04/23 198 lb 12.8 oz (90.2 kg)    General: Appears her stated age,obese, in NAD. Skin: Warm, dry and intact. No ulcerations noted. HEENT: Head: normal shape and size; Eyes: sclera white, no icterus, conjunctiva pink, PERRLA and EOMs intact;  Neck:  Neck supple, trachea midline. No masses, lumps or thyromegaly present.  Cardiovascular: Normal rate and rhythm. S1,S2 noted.  No murmur, rubs or gallops noted. Trace BLE edema. No carotid bruits noted. Pulmonary/Chest: Normal effort and positive vesicular breath sounds. No respiratory distress. No wheezes, rales or ronchi noted.  Abdomen: Normal bowel sounds.  Musculoskeletal: Strength 5/5 BUE/BLE. No difficulty with gait.  Neurological: Alert and oriented. Cranial nerves II-XII grossly intact. Coordination normal.  Psychiatric: Mood and affect normal. Behavior is normal. Judgment and thought content normal.     BMET    Component Value Date/Time   NA 138 01/29/2024 1315   K 3.3 (L) 01/29/2024 1315   CL 104 01/29/2024 1315   CO2 25 01/29/2024 1315   GLUCOSE 229 (H) 01/29/2024 1315    BUN 15 01/29/2024 1315   CREATININE 0.81 01/29/2024 1315   CREATININE 0.81 12/26/2022 1530   CALCIUM 9.0 01/29/2024 1315   GFRNONAA >60 01/29/2024 1315   GFRNONAA 77 03/22/2020 0802   GFRAA 89 03/22/2020 0802    Lipid Panel     Component Value Date/Time   CHOL 97 12/26/2022 1530   CHOL 168 03/01/2021 1649   TRIG 86 12/26/2022 1530   HDL 32 (L) 12/26/2022 1530   HDL 33 (L) 03/01/2021 1649   CHOLHDL 3.0 12/26/2022 1530   VLDL 23 04/04/2017 0941   LDLCALC 48 12/26/2022 1530    CBC    Component Value Date/Time   WBC 6.3 01/29/2024 1315   WBC 7.5 05/16/2023 1126   RBC 4.11 01/29/2024 1315   RBC 4.09 01/29/2024 1315   HGB 10.0 (L) 01/29/2024 1315   HCT 32.0 (L) 01/29/2024 1315   PLT 295 01/29/2024 1315   MCV 77.9 (L) 01/29/2024 1315   MCH 24.3 (L) 01/29/2024 1315   MCHC 31.3 01/29/2024 1315   RDW 15.7 (H) 01/29/2024 1315   LYMPHSABS 1.4 01/29/2024 1315   MONOABS 0.3 01/29/2024 1315   EOSABS 0.1 01/29/2024 1315   BASOSABS 0.0 01/29/2024 1315    Hgb A1C Lab Results  Component Value Date   HGBA1C 7.6 (A) 08/14/2023           Assessment & Plan:   Preventative Health Maintenance:  She declines flu shot She declines tetanus booster Encouraged her to get her COVID-vaccine She declines Pneumovax Discussed Shingrix vaccine, will check coverage with insurance company and schedule a visit if she would like to have this done Pap smear UTD Mammogram UTD Bone density UTD Colon screening UTD Encouraged her to consume a balanced diet and exercise regimen Advised her to see an eye doctor and dentist annually Will check CBC, c-Met, lipid, A1c and urine microalbumin today  RTC in 6 months, follow-up chronic conditions Nicki Reaper, NP

## 2024-02-11 NOTE — Assessment & Plan Note (Signed)
 Encouraged diet and exercise for weight loss ?

## 2024-02-12 ENCOUNTER — Encounter: Payer: Self-pay | Admitting: Internal Medicine

## 2024-02-12 LAB — MICROALBUMIN / CREATININE URINE RATIO
Creatinine, Urine: 53 mg/dL (ref 20–275)
Microalb Creat Ratio: 23 mg/g{creat} (ref <30)
Microalb, Ur: 1.2 mg/dL

## 2024-02-12 LAB — COMPLETE METABOLIC PANEL WITH GFR
AG Ratio: 1.2 (calc) (ref 1.0–2.5)
ALT: 16 U/L (ref 6–29)
AST: 15 U/L (ref 10–35)
Albumin: 3.8 g/dL (ref 3.6–5.1)
Alkaline phosphatase (APISO): 128 U/L (ref 37–153)
BUN: 17 mg/dL (ref 7–25)
CO2: 26 mmol/L (ref 20–32)
Calcium: 10.2 mg/dL (ref 8.6–10.4)
Chloride: 105 mmol/L (ref 98–110)
Creat: 0.6 mg/dL (ref 0.50–1.03)
Globulin: 3.2 g/dL (ref 1.9–3.7)
Glucose, Bld: 140 mg/dL — ABNORMAL HIGH (ref 65–139)
Potassium: 4.3 mmol/L (ref 3.5–5.3)
Sodium: 141 mmol/L (ref 135–146)
Total Bilirubin: 0.2 mg/dL (ref 0.2–1.2)
Total Protein: 7 g/dL (ref 6.1–8.1)

## 2024-02-12 LAB — LIPID PANEL
Cholesterol: 107 mg/dL (ref <200)
HDL: 34 mg/dL — ABNORMAL LOW (ref >=50)
LDL Cholesterol (Calc): 55 mg/dL
Non-HDL Cholesterol (Calc): 73 mg/dL (ref <130)
Total CHOL/HDL Ratio: 3.1 (calc) (ref <5.0)
Triglycerides: 93 mg/dL (ref <150)

## 2024-02-12 LAB — CBC
HCT: 34.2 % — ABNORMAL LOW (ref 35.0–45.0)
Hemoglobin: 10.8 g/dL — ABNORMAL LOW (ref 11.7–15.5)
MCH: 24.2 pg — ABNORMAL LOW (ref 27.0–33.0)
MCHC: 31.6 g/dL — ABNORMAL LOW (ref 32.0–36.0)
MCV: 76.7 fL — ABNORMAL LOW (ref 80.0–100.0)
MPV: 9.6 fL (ref 7.5–12.5)
Platelets: 353 10*3/uL (ref 140–400)
RBC: 4.46 10*6/uL (ref 3.80–5.10)
RDW: 14.9 % (ref 11.0–15.0)
WBC: 7.3 10*3/uL (ref 3.8–10.8)

## 2024-02-12 LAB — HEMOGLOBIN A1C
Hgb A1c MFr Bld: 6.6 %{Hb} — ABNORMAL HIGH (ref <5.7)
Mean Plasma Glucose: 143 mg/dL
eAG (mmol/L): 7.9 mmol/L

## 2024-02-18 ENCOUNTER — Other Ambulatory Visit: Payer: Self-pay | Admitting: Internal Medicine

## 2024-02-18 DIAGNOSIS — E785 Hyperlipidemia, unspecified: Secondary | ICD-10-CM

## 2024-02-18 DIAGNOSIS — E876 Hypokalemia: Secondary | ICD-10-CM

## 2024-02-18 DIAGNOSIS — E1169 Type 2 diabetes mellitus with other specified complication: Secondary | ICD-10-CM

## 2024-02-18 DIAGNOSIS — E1165 Type 2 diabetes mellitus with hyperglycemia: Secondary | ICD-10-CM

## 2024-02-18 DIAGNOSIS — I10 Essential (primary) hypertension: Secondary | ICD-10-CM

## 2024-02-19 ENCOUNTER — Telehealth: Payer: Self-pay | Admitting: *Deleted

## 2024-02-19 NOTE — Telephone Encounter (Signed)
 Requested Prescriptions  Pending Prescriptions Disp Refills   metFORMIN (GLUCOPHAGE) 500 MG tablet [Pharmacy Med Name: metformin 500 mg tablet] 270 tablet 1    Sig: TAKE TWO TABLETS BY MOUTH DAILY AT 9AM WITH BREAKFAST and TAKE ONE TABLET BY MOUTH DAILY AT 5PM WITH SUPPER. PLEASE SCHEDULE AN OFFICE VISIT BEFORE ANYMORE REFILLS     Endocrinology:  Diabetes - Biguanides Failed - 02/19/2024  5:07 PM      Failed - Valid encounter within last 6 months    Recent Outpatient Visits           1 week ago Encounter for general adult medical examination with abnormal findings   [facility] [place], [place] W, NP              Passed - Cr in normal range and within 360 days    Creat  Date Value Ref Range Status  02/11/2024 0.60 0.50 - 1.03 mg/dL Final   Creatinine, Urine  Date Value Ref Range Status  02/11/2024 53 20 - 275 mg/dL Final         Passed - HBA1C is between 0 and 7.9 and within 180 days    HbA1c, POC (controlled diabetic range)  Date Value Ref Range Status  08/14/2023 7.6 (A) 0.0 - 7.0 % Final   Hgb A1c MFr Bld  Date Value Ref Range Status  02/11/2024 6.6 (H) <5.7 % of total Hgb Final    Comment:    For someone without known diabetes, a hemoglobin A1c value of 6.5% or greater indicates that they may have  diabetes and this should be confirmed with a follow-up  test. . For someone with known diabetes, a value <7% indicates  that their diabetes is well controlled and a value  greater than or equal to 7% indicates suboptimal  control. A1c targets should be individualized based on  duration of diabetes, age, comorbid conditions, and  other considerations. . Currently, no consensus exists regarding use of hemoglobin A1c for diagnosis of diabetes for children. .          Passed - eGFR in normal range and within 360 days    GFR, Est African American  Date Value Ref Range Status  03/22/2020 89 > OR = 60 mL/min/1.22m2 Final   GFR, Est Non  African American  Date Value Ref Range Status  03/22/2020 77 > OR = 60 mL/min/1.10m2 Final   GFR, Estimated  Date Value Ref Range Status  01/29/2024 >60 >60 mL/min Final    Comment:    (NOTE) Calculated using the CKD-EPI Creatinine Equation (2021)    eGFR  Date Value Ref Range Status  12/26/2022 86 > OR = 60 mL/min/1.33m2 Final         Passed - B12 Level in normal range and within 720 days    Vitamin B-12  Date Value Ref Range Status  01/29/2024 238 180 - 914 pg/mL Final    Comment:    (NOTE) This assay is not validated for testing neonatal or myeloproliferative syndrome specimens for Vitamin B12 levels. Performed at [facility], [address] [place], [place] [address]          Passed - CBC within normal limits and completed in the last 12 months    WBC  Date Value Ref Range Status  02/11/2024 7.3 3.8 - 10.8 Thousand/uL Final   RBC  Date Value Ref Range Status  02/11/2024 4.46 3.80 - 5.10 Million/uL Final  Hemoglobin  Date Value Ref Range Status  02/11/2024 10.8 (L) 11.7 - 15.5 g/dL Final  16/08/9603 54.0 (L) 12.0 - 15.0 g/dL Final    Comment:    Reticulocyte Hemoglobin testing may be clinically indicated, consider ordering this additional test [id number]    HCT  Date Value Ref Range Status  02/11/2024 34.2 (L) 35.0 - 45.0 % Final   MCHC  Date Value Ref Range Status  02/11/2024 31.6 (L) 32.0 - 36.0 g/dL Final    Comment:    For adults, a slight decrease in the calculated MCHC value (in the range of 30 to 32 g/dL) is most likely not clinically significant; however, it should be interpreted with caution in correlation with other red cell parameters and the patient's clinical condition.    [facility]  Date Value Ref Range Status  02/11/2024 24.2 (L) 27.0 - 33.0 pg Final   MCV  Date Value Ref Range Status  02/11/2024 76.7 (L) 80.0 - 100.0 fL Final   No results found for: "PLTCOUNTKUC", "LABPLAT", "POCPLA" RDW  Date Value Ref Range Status   02/11/2024 14.9 11.0 - 15.0 % Final          atorvastatin (LIPITOR) 10 MG tablet [Pharmacy Med Name: atorvastatin 10 mg tablet] 90 tablet 1    Sig: TAKE ONE TABLET (10 MG TOTAL) BY MOUTH DAILY AT 5PM PLEASE SCHEDULE AN OFFICE VISIT BEFORE ANYMORE REFILLS     Cardiovascular:  Antilipid - Statins Failed - 02/19/2024  5:07 PM      Failed - Valid encounter within last 12 months    Recent Outpatient Visits           1 week ago Encounter for general adult medical examination with abnormal findings   [facility] [place], [place] W, NP              Failed - Lipid Panel in normal range within the last 12 months    Cholesterol, Total  Date Value Ref Range Status  03/01/2021 168 100 - 199 mg/dL Final   Cholesterol  Date Value Ref Range Status  02/11/2024 107 <200 mg/dL Final   LDL Cholesterol (Calc)  Date Value Ref Range Status  02/11/2024 55 mg/dL (calc) Final    Comment:    Reference range: <100 . Desirable range <100 mg/dL for primary prevention;   <70 mg/dL for patients with CHD or diabetic patients  with > or = 2 CHD risk factors. Marland Kitchen LDL-C is now calculated using the Martin-Hopkins  calculation, which is a validated novel method providing  better accuracy than the Friedewald equation in the  estimation of LDL-C.  Horald Pollen et al. Lenox Ahr. 8295;621(30): 2061-2068  (http://education.[employer].com/faq/FAQ164)    HDL  Date Value Ref Range Status  02/11/2024 34 (L) > OR = 50 mg/dL Final  86/57/8469 33 (L) >39 mg/dL Final   Triglycerides  Date Value Ref Range Status  02/11/2024 93 <150 mg/dL Final         Passed - Patient is not pregnant       potassium chloride (KLOR-CON) 10 MEQ tablet [Pharmacy Med Name: potassium chloride ER 10 mEq tablet,extended release] 180 tablet 1    Sig: TAKE ONE TABLET BY MOUTH TWICE DAILY @ 9AM & 5PM. PLEASE SCHEDULE AN OFFICE VISIT BEFORE ANYMORE REFILLS     Endocrinology:  Minerals - Potassium  Supplementation Failed - 02/19/2024  5:07 PM      Failed - Valid encounter within last 12 months  Recent Outpatient Visits           1 week ago Encounter for general adult medical examination with abnormal findings   [place] [place], [place] W, NP              Passed - K in normal range and within 360 days    Potassium  Date Value Ref Range Status  02/11/2024 4.3 3.5 - 5.3 mmol/L Final         Passed - Cr in normal range and within 360 days    Creat  Date Value Ref Range Status  02/11/2024 0.60 0.50 - 1.03 mg/dL Final   Creatinine, Urine  Date Value Ref Range Status  02/11/2024 53 20 - 275 mg/dL Final          lisinopril (ZESTRIL) 5 MG tablet [Pharmacy Med Name: lisinopril 5 mg tablet] 90 tablet 1    Sig: TAKE ONE TABLET (5 MG TOTAL) BY MOUTH DAILY AT 9AM PLEASE SCHEDULE AN OFFICE VISIT BEFORE ANYMORE REFILLS     Cardiovascular:  ACE Inhibitors Failed - 02/19/2024  5:07 PM      Failed - Valid encounter within last 6 months    Recent Outpatient Visits           1 week ago Encounter for general adult medical examination with abnormal findings    [facility] [place], Salvadore [place], NP              Passed - Cr in normal range and within 180 days    Creat  Date Value Ref Range Status  02/11/2024 0.60 0.50 - 1.03 mg/dL Final   Creatinine, Urine  Date Value Ref Range Status  02/11/2024 53 20 - 275 mg/dL Final         Passed - K in normal range and within 180 days    Potassium  Date Value Ref Range Status  02/11/2024 4.3 3.5 - 5.3 mmol/L Final         Passed - Patient is not pregnant      Passed - Last BP in normal range    BP Readings from Last 1 Encounters:  02/11/24 124/62          triamterene-hydrochlorothiazide (MAXZIDE-25) 37.5-25 MG tablet [Pharmacy Med Name: triamterene 37.5 mg-hydrochlorothiazide 25 mg tablet] 90 tablet 1    Sig: TAKE ONE TABLET BY MOUTH DAILY AT 9AM PLEASE SCHEDULE AN OFFICE  VISIT BEFORE ANYMORE REFILLS     Cardiovascular: Diuretic Combos Failed - 02/19/2024  5:07 PM      Failed - Valid encounter within last 6 months    Recent Outpatient Visits           1 week ago Encounter for general adult medical examination with abnormal findings    [facility] [place], Salvadore Oxford, NP              Passed - K in normal range and within 180 days    Potassium  Date Value Ref Range Status  02/11/2024 4.3 3.5 - 5.3 mmol/L Final         Passed - Na in normal range and within 180 days    Sodium  Date Value Ref Range Status  02/11/2024 141 135 - 146 mmol/L Final         Passed - Cr in normal range and within 180 days    Creat  Date Value Ref Range Status  02/11/2024 0.60  0.50 - 1.03 mg/dL Final   Creatinine, Urine  Date Value Ref Range Status  02/11/2024 53 20 - 275 mg/dL Final         Passed - Last BP in normal range    BP Readings from Last 1 Encounters:  02/11/24 124/62

## 2024-02-19 NOTE — Telephone Encounter (Signed)
 MD says generic is fine. They will make the change and sent it out to here

## 2024-02-20 ENCOUNTER — Telehealth: Payer: Self-pay

## 2024-02-20 NOTE — Telephone Encounter (Signed)
 Patient called wanting to speak to the nurse regarding the provider or doctor she was scheduled in [facility].  Patient also advised she is wanting to know the providers name in [place] also to call their office.  I was unsure what the patient was referring to but patient stated the nurse knew.

## 2024-02-26 ENCOUNTER — Other Ambulatory Visit: Payer: Self-pay

## 2024-02-26 DIAGNOSIS — I890 Lymphedema, not elsewhere classified: Secondary | ICD-10-CM

## 2024-03-04 ENCOUNTER — Encounter: Payer: Self-pay | Admitting: Occupational Therapy

## 2024-03-04 ENCOUNTER — Ambulatory Visit: Attending: Oncology | Admitting: Occupational Therapy

## 2024-03-04 DIAGNOSIS — I972 Postmastectomy lymphedema syndrome: Secondary | ICD-10-CM | POA: Diagnosis not present

## 2024-03-04 DIAGNOSIS — I890 Lymphedema, not elsewhere classified: Secondary | ICD-10-CM | POA: Diagnosis not present

## 2024-03-04 DIAGNOSIS — Z853 Personal history of malignant neoplasm of breast: Secondary | ICD-10-CM | POA: Diagnosis not present

## 2024-03-04 DIAGNOSIS — N6489 Other specified disorders of breast: Secondary | ICD-10-CM | POA: Diagnosis not present

## 2024-03-04 NOTE — Therapy (Signed)
 OUTPATIENT OCCUPATIONAL THERAPY Lymphedema  EVALUATION  Patient Name: ALLONA GONDEK MRN: [id number] DOB:08-14-1966, 58 y.o., female Today's Date: 03/04/2024  PCP: Dr Sampson Si REFERRING PROVIDER: Dr Cathie Hoops  END OF SESSION:  OT End of Session - 03/04/24 1820     Visit Number 1    Number of Visits 12    Date for OT Re-Evaluation 05/27/24    OT Start Time 1455    OT Stop Time 1535    OT Time Calculation (min) 40 min    Activity Tolerance Patient tolerated treatment well    Behavior During Therapy [facility] for tasks assessed/performed             Past Medical History:  Diagnosis Date   Arthritis    SHOULDER   Breast cancer (HCC) 02/2017   rt breast   Breast cancer (HCC) 2018   Cancer (HCC) 02/28/2017   INVASIVE MAMMARY CARCINOMA WITH MUCINOUS FEATURES.   Colonic diverticular abscess 06/21/2017   Colonoscopy 06/25/2017: No evidence of malignancy.   Diabetes mellitus without complication (HCC)    Hypertension    Irregular heart beat    PT STATES IT "SKIPS A BEAT"    Obesity    Personal history of chemotherapy    Personal history of radiation therapy    Past Surgical History:  Procedure Laterality Date   ANKLE SURGERY     BREAST BIOPSY Right 02/28/2017   INVASIVE MAMMARY CARCINOMA WITH MUCINOUS FEATURES.   BREAST CYST ASPIRATION Right    NEG   COLONOSCOPY WITH PROPOFOL N/A 06/25/2017   Procedure: COLONOSCOPY WITH PROPOFOL;  Surgeon: Wyline Mood, MD;  Location: [facility] ENDOSCOPY;  Service: Gastroenterology;  Laterality: N/A;   MASTECTOMY, PARTIAL Right 07/30/2017   Procedure: MASTECTOMY PARTIAL;  Surgeon: Earline Mayotte, MD;  Location: [facility] ORS;  Service: General;  Laterality: Right;   PORTACATH PLACEMENT Left 03/15/2017   Procedure: INSERTION PORT-A-CATH;  Surgeon: Earline Mayotte, MD;  Location: [facility] ORS;  Service: General;  Laterality: Left;   RE-EXCISION OF BREAST LUMPECTOMY Right 08/17/2017    INVASIVE CARCINOMA EXTENDS TO NEW LATERAL MARGIN. /RE-EXCISION OF BREAST  LUMPECTOMY;: Byrnett, Merrily Pew, MD;  [facility] ORS; General;  Laterality: Right;   SENTINEL NODE BIOPSY Right 07/30/2017   Procedure: SENTINEL NODE BIOPSY;  Surgeon: Earline Mayotte, MD;  Location: [facility] ORS;  Service: General;  Laterality: Right;   SIMPLE MASTECTOMY WITH AXILLARY SENTINEL NODE BIOPSY Right 09/28/2017   Procedure: SIMPLE MASTECTOMY;  Surgeon: Earline Mayotte, MD;  Location: [facility] ORS;  Service: General;  Laterality: Right;   Patient Active Problem List   Diagnosis Date Noted   Osteoarthritis 06/09/2021   Normocytic anemia 06/09/2021   Class 1 obesity due to excess calories with body mass index (BMI) of 31.0 to 31.9 in adult 06/09/2021   History of breast cancer 07/01/2020   Hyperlipidemia associated with type 2 diabetes mellitus (HCC) 10/02/2018   Neuropathy due to chemotherapeutic drug (HCC) 10/02/2018   Controlled type 2 diabetes mellitus with hyperglycemia (HCC) 07/05/2018   Essential hypertension 01/05/2016    ONSET DATE: 2021  REFERRING DIAG: R UE Lymphedema  THERAPY DIAG:  Postmastectomy lymphedema syndrome  Rationale for Evaluation and Treatment: Rehabilitation  SUBJECTIVE:   SUBJECTIVE STATEMENT: My mom passed away last year as well as my husband in December.  My family came to help clean the house and since then could not find my compression sleeve.  But also lost about 50 pounds.  Feeling the bump is not working anymore but I trying  to use it once or twice a day.  More arm is definitely better and heavier.  I need some help again please Pt accompanied by: self  PERTINENT HISTORY: Patient was seen 2023 after referred by Dr. Cathie Hoops with right upper extremity and thoracic lymphedema.  As well as increased right shoulder and axilla pain.  Patient was seen by this OT in 2021 for right upper  UE and thoracic lymphedema.  Patient was fitted with a custom Jobst Elvarex soft compression sleeve and glove for daytime as well as a nighttime garment.  Because of thoracic  lymphedema patient was also fitted with a pump and recommended a unilateral postmastectomy  jovi pack breast pad. Then patient was again seen in 2023 for a flareup of right upper extremity and chest lymphedema p At that time patient was fitted with a over-the-counter Harmony Medi sleeve and glove to wear with high risk activities like pulling, pushing and lifting because of husband's health patient do a lot of that at home.  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  As well as continue to use her pump. Patient returns now after another flareup but also patient lost 50 pounds and referred to lymphedema therapy  PRECAUTIONS: Right upper extremity lymphedema    WEIGHT BEARING RESTRICTIONS: No  PAIN:  Are you having pain?  Some pain in the right shoulder.  Because of arm being heavy  FALLS: Has patient fallen in last 6 months? 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  HAND DOMINANCE: Right   UPPER EXTREMITY ROM:   Patient elbow and wrist and digit active range of motion within functional limits. Patient with decreased shoulder flexion and abduction.  Because of heaviness of upper extremity but also was limited in 2023 and shoulder motion.   COGNITION: Overall cognitive status: Within functional limits for tasks assessed  Patient report 8 lymph nodes was removed-and right mastectomy  LYMPHEDEMA/ONCOLOGY QUESTIONNAIRE - 03/04/24 0001       Type   Cancer Type R breast CA      Right Upper Extremity Lymphedema   15 cm Proximal to Olecranon Process 38 cm    10 cm Proximal to Olecranon Process 35 cm    Olecranon Process 31 cm    15 cm Proximal to Ulnar Styloid Process 29.2 cm    10 cm  Proximal to Ulnar Styloid Process 24.4 cm    Just Proximal to Ulnar Styloid Process 19.5 cm    Across Hand at [facility] 20 cm      Left Upper Extremity Lymphedema   15 cm Proximal to Olecranon Process 36.5 cm    10 cm Proximal to Olecranon Process 32 cm    Olecranon Process 27 cm    15 cm Proximal to Ulnar Styloid Process 24.7 cm    10 cm Proximal to Ulnar Styloid Process 20.5 cm    Just Proximal to Ulnar Styloid Process 17.5 cm    Across Hand at [facility] 18.5 cm                 TREATMENT DATE: 04/02/24  Patient lost 50 pounds Her lymphedema pump sleeve not fitting anymore. Patient garment lost since family cleaned her house after her husband and mom passed away Educated patient on light compression bandaging to do at home for a week Nighttime lotion on the right upper extremity with Isotoner glove and stockinette followed by Rosidal foam overlapping 50% from hand to upper arm. Followed by 8 cm wrist short stretch bandage at wrist through the hand up to the proximal forearm and then 10 cm short stretch from wrist through the hand overlapping 50% up to axilla.   During the day patient can continue with stockinette with Isotoner glove and 8 cm short stretch bandage from wrist 3 times to the wrist and then overlap to elbow followed by 10 cm short stretch starting at wrist 1 rep through the hand 1 time and then overlap 50% up to axilla.  Patient can keep bandages off for about 2 hours morning and evening Try her pump still twice a day but OT will contact lymphedema pump rep    PATIENT EDUCATION: Education details: findings of eval and HEP  Person educated: Patient Education method: Explanation, Demonstration, Tactile cues, Verbal cues, and Handouts Education comprehension: verbalized understanding, returned demonstration, verbal cues required, and  needs further education     LONG TERM GOALS: Target date: 12 wks  Patient to be independent in home program to do light lymphedema compression as well as use of pump to decrease right upper extremity circumference by 2 cm in forearm, elbow and upper arm Baseline: Patient is increased in the upper arm by 3 cm, at the elbow 4 cm and forearm 5.5 cm and wrist 2 cm.  Increase in circumference compared to the left is much more than it was in 2023.  At that time was closer to 1 cm. Goal status: INITIAL  2.  Patient right upper extremity circumference decreased for patient to be fitted with correct compression sleeve and glove to maintain her circumference to prevent infection Baseline: Patient wore in the past Jovi pack custom sleeve as well as [employer].  Patient lost her compression sleeves while family was cleaning her house after her husband and mom passed away but patient also in the meantime lost 50 pounds and pump not fitting Goal status: INITIAL Goal status: INITIAL ASSESSMENT:  CLINICAL IMPRESSION: Patient seen today for occupational therapy evaluation for right upper extremity lymphedema.  Patient had breast cancer in the past with a mastectomy and patient reports 8 lymph nodes removed.  Patient was seen by this OT in 2021 as well as 2023 for right upper extremity lymphedema.  Patient was last fitted with a lymphedema pump as well a Medi Harmony sleeve and glove to wear with high risk activity.  Patient also had nighttime garment in the past.  Patient returns today after she lost her mom in that.  Patient also lost 50 pounds.  Report pump not working anymore.  Patient circumference in right upper extremity compared to the left is increased by 3 cm in upper arm, 4 cm at the elbow and forearm 5.5 cm.  Wrist is increased by 2 cm in the hand 1.5.  When seen in 2023 patient was increased by only about 1 cm and could only wear compression sleeve with high risk activity.  Patient can benefit from  skilled OT services for CDT treatment for decreasing circumference of right upper extremity to decrease and decongest right upper extremity to be fitted with appropriate compression garments to maintain her circumference  and prevent infection.  Will contact rep for lymphedema pump for possibility for patient to be fitted with a new one.  Patient was educated in light compression to be done at home 24/7 for a week until next appointment with OT as well as using her old pump in the meantime.   PERFORMANCE DEFICITS: in functional skills including ADLs, IADLs, ROM, strength, pain, flexibility, decreased knowledge of use of DME, and UE functional use,   and psychosocial skills including environmental adaptation and routines and behaviors.   IMPAIRMENTS: are limiting patient from ADLs, IADLs, rest and sleep, play, leisure, and social participation.   COMORBIDITIES: has other co-morbidities that affects occupational performance. Patient will benefit from skilled OT to address above impairments and improve overall function.  MODIFICATION OR ASSISTANCE TO COMPLETE EVALUATION: modification of tasks or assist necessary to complete an evaluation.  OT OCCUPATIONAL PROFILE AND HISTORY: Problem focused assessment: Including review of records relating to presenting problem.  CLINICAL DECISION MAKING: MOD - limited treatment options,  task modification necessary  REHAB POTENTIAL: Good for goals  EVALUATION COMPLEXITY: Mod      PLAN:  OT FREQUENCY: 1-2x/week  OT DURATION: 12 weeks  PLANNED INTERVENTIONS: 97168 OT Re-evaluation, 97535 self care/ADL training, 40981 therapeutic exercise, 97530 therapeutic activity, 97140 manual therapy, manual lymph drainage, compression bandaging, patient/family education, and DME and/or AE instructions     CONSULTED AND AGREED WITH PLAN OF CARE: Patient     Heloise Lobo, OTR/L,CLT 03/04/2024, 6:26 PM

## 2024-03-05 ENCOUNTER — Other Ambulatory Visit: Payer: Self-pay | Admitting: Internal Medicine

## 2024-03-05 DIAGNOSIS — E1169 Type 2 diabetes mellitus with other specified complication: Secondary | ICD-10-CM

## 2024-03-05 DIAGNOSIS — E1165 Type 2 diabetes mellitus with hyperglycemia: Secondary | ICD-10-CM

## 2024-03-05 DIAGNOSIS — E876 Hypokalemia: Secondary | ICD-10-CM

## 2024-03-05 NOTE — Telephone Encounter (Signed)
 Copied from CRM [id number]. Topic: Clinical - Medication Refill >> Mar 05, 2024  1:37 PM Everette C wrote: Most Recent Primary Care Visit:  Provider: Carollynn Cirri  Department: [facility]  Visit Type: PHYSICAL  Date: 02/11/2024  Medication: lisinopril (ZESTRIL) 5 MG tablet [[id number]]  potassium chloride (KLOR-CON) 10 MEQ tablet [[id number]]  atorvastatin (LIPITOR) 10 MG tablet [[id number]]  metFORMIN (GLUCOPHAGE) 500 MG tablet [[id number]]  Has the patient contacted their pharmacy? Yes (Agent: If no, request that the patient contact the pharmacy for the refill. If patient does not wish to contact the pharmacy document the reason why and proceed with request.) (Agent: If yes, when and what did the pharmacy advise?)  Is this the correct pharmacy for this prescription? Yes If no, delete pharmacy and type the correct one.  This is the patient's preferred pharmacy:  [facility] [place] - [address] [place] [address] Phone: [phone] Fax: [phone]  Has the prescription been filled recently? Yes  Is the patient out of the medication? Yes  Has the patient been seen for an appointment in the last year OR does the patient have an upcoming appointment? Yes  Can we respond through MyChart? No  Agent: Please be advised that Rx refills may take up to 3 business days. We ask that you follow-up with your pharmacy.

## 2024-03-06 NOTE — Telephone Encounter (Signed)
 Duplicate request last RF 02/19/24.  Requested Prescriptions  Pending Prescriptions Disp Refills   atorvastatin (LIPITOR) 10 MG tablet 90 tablet 1     Cardiovascular:  Antilipid - Statins Failed - 03/06/2024  1:17 PM      Failed - Valid encounter within last 12 months    Recent Outpatient Visits           3 weeks ago Encounter for general adult medical examination with abnormal findings   Chelsea Hudson [place], [place] W, NP              Failed - Lipid Panel in normal range within the last 12 months    Cholesterol, Total  Date Value Ref Range Status  03/01/2021 168 100 - 199 mg/dL Final   Cholesterol  Date Value Ref Range Status  02/11/2024 107 <200 mg/dL Final   LDL Cholesterol (Calc)  Date Value Ref Range Status  02/11/2024 55 mg/dL (calc) Final    Comment:    Reference range: <100 . Desirable range <100 mg/dL for primary prevention;   <70 mg/dL for patients with CHD or diabetic patients  with > or = 2 CHD risk factors. Marland Kitchen LDL-C is now calculated using the Martin-Hopkins  calculation, which is a validated novel method providing  better accuracy than the Friedewald equation in the  estimation of LDL-C.  Horald Pollen et al. Lenox Ahr. 1610;960(45): 2061-2068  (http://education.[facility].com/faq/FAQ164)    HDL  Date Value Ref Range Status  02/11/2024 34 (L) > OR = 50 mg/dL Final  40/98/1191 33 (L) >39 mg/dL Final   Triglycerides  Date Value Ref Range Status  02/11/2024 93 <150 mg/dL Final         Passed - Patient is not pregnant       lisinopril (ZESTRIL) 5 MG tablet 90 tablet 1     Cardiovascular:  ACE Inhibitors Failed - 03/06/2024  1:17 PM      Failed - Valid encounter within last 6 months    Recent Outpatient Visits           3 weeks ago Encounter for general adult medical examination with abnormal findings   [place] [facility] [place], [place] W, NP              Passed - Cr in normal range and within  180 days    Creat  Date Value Ref Range Status  02/11/2024 0.60 0.50 - 1.03 mg/dL Final   Creatinine, Urine  Date Value Ref Range Status  02/11/2024 53 20 - 275 mg/dL Final         Passed - K in normal range and within 180 days    Potassium  Date Value Ref Range Status  02/11/2024 4.3 3.5 - 5.3 mmol/L Final         Passed - Patient is not pregnant      Passed - Last BP in normal range    BP Readings from Last 1 Encounters:  02/11/24 124/62          metFORMIN (GLUCOPHAGE) 500 MG tablet 270 tablet 1    Sig: TAKE TWO TABLETS BY MOUTH DAILY AT 9AM WITH BREAKFAST and TAKE ONE TABLET BY MOUTH DAILY AT 5PM WITH SUPPER. PLEASE SCHEDULE AN OFFICE VISIT BEFORE ANYMORE REFILLS     Endocrinology:  Diabetes - Biguanides Failed - 03/06/2024  1:17 PM      Failed - Valid encounter within last 6 months    Recent Outpatient Visits  3 weeks ago Encounter for general adult medical examination with abnormal findings   [place] [facility] [place], [place] W, NP              Passed - Cr in normal range and within 360 days    Creat  Date Value Ref Range Status  02/11/2024 0.60 0.50 - 1.03 mg/dL Final   Creatinine, Urine  Date Value Ref Range Status  02/11/2024 53 20 - 275 mg/dL Final         Passed - HBA1C is between 0 and 7.9 and within 180 days    HbA1c, POC (controlled diabetic range)  Date Value Ref Range Status  08/14/2023 7.6 (A) 0.0 - 7.0 % Final   Hgb A1c MFr Bld  Date Value Ref Range Status  02/11/2024 6.6 (H) <5.7 % of total Hgb Final    Comment:    For someone without known diabetes, a hemoglobin A1c value of 6.5% or greater indicates that they may have  diabetes and this should be confirmed with a follow-up  test. . For someone with known diabetes, a value <7% indicates  that their diabetes is well controlled and a value  greater than or equal to 7% indicates suboptimal  control. A1c targets should be individualized based on  duration  of diabetes, age, comorbid conditions, and  other considerations. . Currently, no consensus exists regarding use of hemoglobin A1c for diagnosis of diabetes for children. .          Passed - eGFR in normal range and within 360 days    GFR, Est African American  Date Value Ref Range Status  03/22/2020 89 > OR = 60 mL/min/1.37m2 Final   GFR, Est Non African American  Date Value Ref Range Status  03/22/2020 77 > OR = 60 mL/min/1.58m2 Final   GFR, Estimated  Date Value Ref Range Status  01/29/2024 >60 >60 mL/min Final    Comment:    (NOTE) Calculated using the CKD-EPI Creatinine Equation (2021)    eGFR  Date Value Ref Range Status  12/26/2022 86 > OR = 60 mL/min/1.58m2 Final         Passed - B12 Level in normal range and within 720 days    Vitamin B-12  Date Value Ref Range Status  01/29/2024 238 180 - 914 pg/mL Final    Comment:    (NOTE) This assay is not validated for testing neonatal or myeloproliferative syndrome specimens for Vitamin B12 levels. Performed at [facility], [address], [place], [place] [address]          Passed - CBC within normal limits and completed in the last 12 months    WBC  Date Value Ref Range Status  02/11/2024 7.3 3.8 - 10.8 Thousand/uL Final   RBC  Date Value Ref Range Status  02/11/2024 4.46 3.80 - 5.10 Million/uL Final   Hemoglobin  Date Value Ref Range Status  02/11/2024 10.8 (L) 11.7 - 15.5 g/dL Final  69/62/9528 41.3 (L) 12.0 - 15.0 g/dL Final    Comment:    Reticulocyte Hemoglobin testing may be clinically indicated, consider ordering this additional test [id number]    HCT  Date Value Ref Range Status  02/11/2024 34.2 (L) 35.0 - 45.0 % Final   MCHC  Date Value Ref Range Status  02/11/2024 31.6 (L) 32.0 - 36.0 g/dL Final    Comment:    For adults, a slight decrease in the calculated MCHC value (in the  range of 30 to 32 g/dL) is most likely not clinically significant; however, it should be interpreted  with caution in correlation with other red cell parameters and the patient's clinical condition.    [facility]  Date Value Ref Range Status  02/11/2024 24.2 (L) 27.0 - 33.0 pg Final   MCV  Date Value Ref Range Status  02/11/2024 76.7 (L) 80.0 - 100.0 fL Final   No results found for: "PLTCOUNTKUC", "LABPLAT", "POCPLA" RDW  Date Value Ref Range Status  02/11/2024 14.9 11.0 - 15.0 % Final          potassium chloride (KLOR-CON) 10 MEQ tablet 180 tablet 1     Endocrinology:  Minerals - Potassium Supplementation Failed - 03/06/2024  1:17 PM      Failed - Valid encounter within last 12 months    Recent Outpatient Visits           3 weeks ago Encounter for general adult medical examination with abnormal findings   [place] [facility] [place], [place], NP              Passed - K in normal range and within 360 days    Potassium  Date Value Ref Range Status  02/11/2024 4.3 3.5 - 5.3 mmol/L Final         Passed - Cr in normal range and within 360 days    Creat  Date Value Ref Range Status  02/11/2024 0.60 0.50 - 1.03 mg/dL Final   Creatinine, Urine  Date Value Ref Range Status  02/11/2024 53 20 - 275 mg/dL Final

## 2024-03-10 ENCOUNTER — Other Ambulatory Visit: Payer: Self-pay | Admitting: Oncology

## 2024-03-11 ENCOUNTER — Ambulatory Visit: Admitting: Occupational Therapy

## 2024-03-14 ENCOUNTER — Ambulatory Visit: Payer: 59

## 2024-03-14 DIAGNOSIS — Z0000 Encounter for general adult medical examination without abnormal findings: Secondary | ICD-10-CM | POA: Diagnosis not present

## 2024-03-14 DIAGNOSIS — E1165 Type 2 diabetes mellitus with hyperglycemia: Secondary | ICD-10-CM

## 2024-03-14 NOTE — Patient Instructions (Addendum)
 Chelsea Hudson , Thank you for taking time to come for your Medicare Wellness Visit. I appreciate your ongoing commitment to your health goals. Please review the following plan we discussed and let me know if I can assist you in the future.   Referrals/Orders/Follow-Ups/Clinician Recommendations: NONE  This is a list of the screening recommended for you and due dates:  Health Maintenance  Topic Date Due   Zoster (Shingles) Vaccine (1 of 2) 05/13/2024*   DTaP/Tdap/Td vaccine (1 - Tdap) 02/10/2025*   Pneumococcal Vaccination (1 of 2 - PCV) 02/10/2025*   Flu Shot  06/20/2024   Hemoglobin A1C  08/13/2024   Eye exam for diabetics  11/04/2024   Mammogram  02/03/2025   Yearly kidney function blood test for diabetes  02/10/2025   Yearly kidney health urinalysis for diabetes  02/10/2025   Complete foot exam   02/10/2025   Medicare Annual Wellness Visit  03/14/2025   Pap with HPV screening  11/23/2026   Colon Cancer Screening  06/26/2027   Hepatitis C Screening  Completed   HIV Screening  Completed   HPV Vaccine  Aged Out   Meningitis B Vaccine  Aged Out   COVID-19 Vaccine  Discontinued  *Topic was postponed. The date shown is not the original due date.    Advanced directives: (ACP Link)Information on Advanced Care Planning can be found at [place]  Secretary of [facility] Advance Health Care Directives Advance Health Care Directives. [email or web address]   Next Medicare Annual Wellness Visit scheduled for next year: Yes  03/20/25 @ 11:30 AM BY PHONE

## 2024-03-14 NOTE — Progress Notes (Signed)
 Subjective:   Chelsea Hudson is a 58 y.o. who presents for a Medicare Wellness preventive visit.  Visit Complete: Virtual I connected with  Tristina L Jay on 03/14/24 by a audio enabled telemedicine application and verified that I am speaking with the correct person using two identifiers.  Patient Location: Home  Provider Location: Office/Clinic  I discussed the limitations of evaluation and management by telemedicine. The patient expressed understanding and agreed to proceed.  Vital Signs: Because this visit was a virtual/telehealth visit, some criteria may be missing or patient reported. Any vitals not documented were not able to be obtained and vitals that have been documented are patient reported.  VideoDeclined- This patient declined Librarian, academic. Therefore the visit was completed with audio only.  Persons Participating in Visit: Patient.  AWV Questionnaire: No: Patient Medicare AWV questionnaire was not completed prior to this visit.  Cardiac Risk Factors include: advanced age (>68men, >43 women);diabetes mellitus;hypertension;dyslipidemia;sedentary lifestyle;obesity (BMI >30kg/m2)     Objective:    There were no vitals filed for this visit. There is no height or weight on file to calculate BMI.     03/14/2024   12:48 PM 01/29/2024    1:22 PM 05/16/2023   12:09 PM 03/09/2023    2:04 PM 11/08/2022    2:22 PM 06/06/2022    8:31 AM 05/09/2022    2:03 PM  Advanced Directives  Does Patient Have a Medical Advance Directive? No No No No No No No  Would patient like information on creating a medical advance directive? No - Patient declined No - Patient declined No - Patient declined No - Patient declined  No - Patient declined     Current Medications (verified) Outpatient Encounter Medications as of 03/14/2024  Medication Sig   atorvastatin  (LIPITOR) 10 MG tablet TAKE ONE TABLET (10 MG TOTAL) BY MOUTH DAILY AT 5PM PLEASE SCHEDULE AN OFFICE  VISIT BEFORE ANYMORE REFILLS   Calcium  Carbonate-Vitamin D  (CALTRATE 600+D PO) Take by mouth.   cyanocobalamin  (VITAMIN B12) 1000 MCG tablet Take 1 tablet (1,000 mcg total) by mouth daily.   ferrous sulfate 325 (65 FE) MG tablet Take 325 mg by mouth 2 (two) times daily with a meal.   gabapentin  (NEURONTIN ) 400 MG capsule TAKE 1 CAPSULE BY MOUTH AT  BEDTIME   letrozole  (FEMARA ) 2.5 MG tablet TAKE ONE TABLET BY MOUTH DAILY   lisinopril  (ZESTRIL ) 5 MG tablet TAKE ONE TABLET (5 MG TOTAL) BY MOUTH DAILY AT 9AM PLEASE SCHEDULE AN OFFICE VISIT BEFORE ANYMORE REFILLS   metFORMIN  (GLUCOPHAGE ) 500 MG tablet TAKE TWO TABLETS BY MOUTH DAILY AT 9AM WITH BREAKFAST and TAKE ONE TABLET BY MOUTH DAILY AT 5PM WITH SUPPER. PLEASE SCHEDULE AN OFFICE VISIT BEFORE ANYMORE REFILLS   potassium chloride  (KLOR-CON ) 10 MEQ tablet TAKE ONE TABLET BY MOUTH TWICE DAILY @ 9AM & 5PM. PLEASE SCHEDULE AN OFFICE VISIT BEFORE ANYMORE REFILLS   triamterene -hydrochlorothiazide (MAXZIDE-25) 37.5-25 MG tablet TAKE ONE TABLET BY MOUTH DAILY AT 9AM PLEASE SCHEDULE AN OFFICE VISIT BEFORE ANYMORE REFILLS   No facility-administered encounter medications on file as of 03/14/2024.    Allergies (verified) Other   History: Past Medical History:  Diagnosis Date   Arthritis    SHOULDER   Breast cancer (HCC) 02/2017   rt breast   Breast cancer (HCC) 2018   Cancer (HCC) 02/28/2017   INVASIVE MAMMARY CARCINOMA WITH MUCINOUS FEATURES.   Colonic diverticular abscess 06/21/2017   Colonoscopy 06/25/2017: No evidence of malignancy.   Diabetes  mellitus without complication (HCC)    Hypertension    Irregular heart beat    PT STATES IT "SKIPS A BEAT"    Obesity    Personal history of chemotherapy    Personal history of radiation therapy    Past Surgical History:  Procedure Laterality Date   ANKLE SURGERY     BREAST BIOPSY Right 02/28/2017   INVASIVE MAMMARY CARCINOMA WITH MUCINOUS FEATURES.   BREAST CYST ASPIRATION Right    NEG    COLONOSCOPY WITH PROPOFOL  N/A 06/25/2017   Procedure: COLONOSCOPY WITH PROPOFOL ;  Surgeon: Luke Salaam, MD;  Location: [facility] ENDOSCOPY;  Service: Gastroenterology;  Laterality: N/A;   MASTECTOMY, PARTIAL Right 07/30/2017   Procedure: MASTECTOMY PARTIAL;  Surgeon: Marshall Skeeter, MD;  Location: [facility] ORS;  Service: General;  Laterality: Right;   PORTACATH PLACEMENT Left 03/15/2017   Procedure: INSERTION PORT-A-CATH;  Surgeon: Marshall Skeeter, MD;  Location: [facility] ORS;  Service: General;  Laterality: Left;   RE-EXCISION OF BREAST LUMPECTOMY Right 08/17/2017    INVASIVE CARCINOMA EXTENDS TO NEW LATERAL MARGIN. /RE-EXCISION OF BREAST LUMPECTOMY;: Byrnett, Magali Schmitz, MD;  [facility] ORS; General;  Laterality: Right;   SENTINEL NODE BIOPSY Right 07/30/2017   Procedure: SENTINEL NODE BIOPSY;  Surgeon: Marshall Skeeter, MD;  Location: [facility] ORS;  Service: General;  Laterality: Right;   SIMPLE MASTECTOMY WITH AXILLARY SENTINEL NODE BIOPSY Right 09/28/2017   Procedure: SIMPLE MASTECTOMY;  Surgeon: Marshall Skeeter, MD;  Location: [facility] ORS;  Service: General;  Laterality: Right;   Family History  Problem Relation Age of Onset   Diabetes Father    Stroke Father    Hypertension Father    Hypertension Mother    Brain cancer Maternal Aunt 7   Diabetes Sister    Colon cancer Neg Hx    Breast cancer Neg Hx    Social History   Socioeconomic History   Marital status: Married    Spouse name: Not on file   Number of children: Not on file   Years of education: Not on file   Highest education level: Not on file  Occupational History   Not on file  Tobacco Use   Smoking status: Former    Current packs/day: 0.00    Average packs/day: 0.3 packs/day for 25.0 years (6.3 ttl pk-yrs)    Types: Cigarettes    Start date: 10/20/1992    Quit date: 10/20/2017    Years since quitting: 6.4   Smokeless tobacco: Former  Building services engineer status: Never Used  Substance and Sexual Activity   Alcohol use: Not Currently     Comment: occas   Drug use: No   Sexual activity: Not Currently  Other Topics Concern   Not on file  Social History Narrative   Not on file   Social Drivers of Health   Financial Resource Strain: Low Risk  (03/14/2024)   Overall Financial Resource Strain (CARDIA)    Difficulty of Paying Living Expenses: Not hard at all  Food Insecurity: No Food Insecurity (03/14/2024)   Hunger Vital Sign    Worried About Running Out of Food in the Last Year: Never true    Ran Out of Food in the Last Year: Never true  Transportation Needs: No Transportation Needs (03/14/2024)   PRAPARE - Administrator, Civil Service (Medical): No    Lack of Transportation (Non-Medical): No  Physical Activity: Inactive (03/14/2024)   Exercise Vital Sign    Days of Exercise per Week: 0  days    Minutes of Exercise per Session: 0 min  Stress: No Stress Concern Present (03/14/2024)   [employer] of Occupational Health - Occupational Stress Questionnaire    Feeling of Stress : Not at all  Social Connections: Socially Integrated (03/14/2024)   Social Connection and Isolation Panel [NHANES]    Frequency of Communication with Friends and Family: More than three times a week    Frequency of Social Gatherings with Friends and Family: Twice a week    Attends Religious Services: More than 4 times per year    Active Member of [employer] or Organizations: Yes    Attends Engineer, structural: More than 4 times per year    Marital Status: Married    Tobacco Counseling Counseling given: Not Answered    Clinical Intake:  Pre-visit preparation completed: Yes  Pain : No/denies pain     BMI - recorded: 31.2 Nutritional Status: BMI > 30  Obese Nutritional Risks: None Diabetes: Yes CBG done?: No Did pt. bring in CBG monitor from home?: No  Lab Results  Component Value Date   HGBA1C 6.6 (H) 02/11/2024   HGBA1C 7.6 (A) 08/14/2023   HGBA1C 8.2 (H) 12/26/2022     How often do you need to have  someone help you when you read instructions, pamphlets, or other written materials from your doctor or pharmacy?: 1 - Never  Interpreter Needed?: No  Information entered by :: Dellie Fergusson, LPN   Activities of Daily Living    03/14/2024   12:49 PM 08/14/2023    1:31 PM  In your present state of health, do you have any difficulty performing the following activities:  Hearing? 0 0  Vision? 0 0  Difficulty concentrating or making decisions? 0 0  Walking or climbing stairs? 0 0  Dressing or bathing? 0 0  Doing errands, shopping? 0 0  Preparing Food and eating ? N   Using the Toilet? N   In the past six months, have you accidently leaked urine? N   Do you have problems with loss of bowel control? N   Managing your Medications? N   Managing your Finances? N   Housekeeping or managing your Housekeeping? N     Patient Care Team: Timmy Forbes, MD as PCP - General (Oncology) Burnie Cartwright, RN as Registered Nurse Arlette Benders, RN (Inactive) as Registered Nurse Byrnett, Magali Schmitz, MD (General Surgery) Timmy Forbes, MD as Consulting Physician (Oncology) Timmy Forbes, MD as Consulting Physician (Oncology) [facility], [facility]  Indicate any recent Medical Services you may have received from other than [facility] providers in the past year (date may be approximate).     Assessment:   This is a routine wellness examination for Alyzza.  Hearing/Vision screen Hearing Screening - Comments:: NO AIDS Vision Screening - Comments:: WEARS GLASSES ALL DAY- PATTY VISION   Goals Addressed             This Visit's Progress    DIET - INCREASE WATER INTAKE         Depression Screen     03/14/2024   12:46 PM 02/11/2024    3:21 PM 12/04/2023    3:27 PM 08/14/2023    1:31 PM 03/09/2023    2:03 PM 12/26/2022    3:35 PM 05/02/2022    4:28 PM  PHQ 2/9 Scores  PHQ - 2 Score 0 0 0 2 0 0 1  PHQ- 9 Score 0   3 0  2  Fall Risk     03/14/2024   12:49 PM 02/11/2024    3:21 PM 12/04/2023    3:27 PM  08/14/2023    1:31 PM 03/09/2023    2:04 PM  Fall Risk   Falls in the past year? 0 0 0 0 0  Number falls in past yr: 0    0  Injury with Fall? 0   0 0  Risk for fall due to : No Fall Risks   No Fall Risks No Fall Risks  Follow up Falls prevention discussed;Falls evaluation completed    Falls prevention discussed;Falls evaluation completed    MEDICARE RISK AT HOME:  Medicare Risk at Home Any stairs in or around the home?: Yes If so, are there any without handrails?: Yes Home free of loose throw rugs in walkways, pet beds, electrical cords, etc?: Yes Adequate lighting in your home to reduce risk of falls?: Yes Life alert?: No Use of a cane, walker or w/c?: No Grab bars in the bathroom?: No Shower chair or bench in shower?: No Elevated toilet seat or a handicapped toilet?: Yes  TIMED UP AND GO:  Was the test performed?  No  Cognitive Function: 6CIT completed        03/14/2024   12:51 PM 03/09/2023    2:09 PM 03/06/2022   10:39 AM  6CIT Screen  What Year? 0 points 0 points 0 points  What month? 0 points 0 points 0 points  What time? 0 points 3 points 0 points  Count back from 20 0 points 0 points 0 points  Months in reverse 0 points 0 points 0 points  Repeat phrase 0 points 4 points 2 points  Total Score 0 points 7 points 2 points    Immunizations  There is no immunization history on file for this patient.  Screening Tests Health Maintenance  Topic Date Due   Zoster Vaccines- Shingrix (1 of 2) 05/13/2024 (Originally 02/14/1985)   DTaP/Tdap/Td (1 - Tdap) 02/10/2025 (Originally [birth year]14/1985)   Pneumococcal Vaccine 33-18 Years old (1 of 2 - PCV) 02/10/2025 (Originally [birth year]14/1985)   INFLUENZA VACCINE  06/20/2024   HEMOGLOBIN A1C  08/13/2024   OPHTHALMOLOGY EXAM  11/04/2024   MAMMOGRAM  02/03/2025   Diabetic kidney evaluation - eGFR measurement  02/10/2025   Diabetic kidney evaluation - Urine ACR  02/10/2025   FOOT EXAM  02/10/2025   Medicare Annual Wellness (AWV)   03/14/2025   Cervical Cancer Screening (HPV/Pap Cotest)  11/23/2026   Colonoscopy  06/26/2027   Hepatitis C Screening  Completed   HIV Screening  Completed   HPV VACCINES  Aged Out   Meningococcal B Vaccine  Aged Out   COVID-19 Vaccine  Discontinued    Health Maintenance  There are no preventive care reminders to display for this patient. Health Maintenance Items Addressed: UP TO DATE ON MAMMOGRAM & COLONOSCOPY; DECLINES ALL SHOTS  Additional Screening:  Vision Screening: Recommended annual ophthalmology exams for early detection of glaucoma and other disorders of the eye.  Dental Screening: Recommended annual dental exams for proper oral hygiene  Community Resource Referral / Chronic Care Management: CRR required this visit?  No   CCM required this visit?  No     Plan:     I have personally reviewed and noted the following in the patient's chart:   Medical and social history Use of alcohol, tobacco or illicit drugs  Current medications and supplements including opioid prescriptions. Patient is currently taking opioid prescriptions. Information provided to  patient regarding non-opioid alternatives. Patient advised to discuss non-opioid treatment plan with their provider. Functional ability and status Nutritional status Physical activity Advanced directives List of other physicians Hospitalizations, surgeries, and ER visits in previous 12 months Vitals Screenings to include cognitive, depression, and falls Referrals and appointments  In addition, I have reviewed and discussed with patient certain preventive protocols, quality metrics, and best practice recommendations. A written personalized care plan for preventive services as well as general preventive health recommendations were provided to patient.     Pinky Bright, LPN   0/98/1191   After Visit Summary: (MyChart) Due to this being a telephonic visit, the after visit summary with patients personalized plan  was offered to patient via MyChart   Notes: DM- LABS ORDERED DECLINES ALL SHOTS

## 2024-03-20 ENCOUNTER — Ambulatory Visit: Admitting: Occupational Therapy

## 2024-03-20 ENCOUNTER — Ambulatory Visit: Attending: Oncology | Admitting: Occupational Therapy

## 2024-03-20 DIAGNOSIS — I972 Postmastectomy lymphedema syndrome: Secondary | ICD-10-CM | POA: Insufficient documentation

## 2024-03-20 NOTE — Therapy (Signed)
 OUTPATIENT OCCUPATIONAL THERAPY Lymphedema TREATMENT  Patient Name: Chelsea Hudson MRN: [id number] DOB:[birth year], 58 y.o., female Today's Date: 03/20/2024  PCP: Dr Thalia Filler REFERRING PROVIDER: Dr Wilhelmenia Harada  END OF SESSION:  OT End of Session - 03/20/24 1454     Visit Number 2    Number of Visits 12    Date for OT Re-Evaluation 05/27/24    OT Start Time 1453    OT Stop Time 1535    OT Time Calculation (min) 42 min    Activity Tolerance Patient tolerated treatment well    Behavior During Therapy [facility] for tasks assessed/performed             Past Medical History:  Diagnosis Date   Arthritis    SHOULDER   Breast cancer (HCC) 02/2017   rt breast   Breast cancer (HCC) 2018   Cancer (HCC) 02/28/2017   INVASIVE MAMMARY CARCINOMA WITH MUCINOUS FEATURES.   Colonic diverticular abscess 06/21/2017   Colonoscopy 06/25/2017: No evidence of malignancy.   Diabetes mellitus without complication (HCC)    Hypertension    Irregular heart beat    PT STATES IT "SKIPS A BEAT"    Obesity    Personal history of chemotherapy    Personal history of radiation therapy    Past Surgical History:  Procedure Laterality Date   ANKLE SURGERY     BREAST BIOPSY Right 02/28/2017   INVASIVE MAMMARY CARCINOMA WITH MUCINOUS FEATURES.   BREAST CYST ASPIRATION Right    NEG   COLONOSCOPY WITH PROPOFOL  N/A 06/25/2017   Procedure: COLONOSCOPY WITH PROPOFOL ;  Surgeon: Luke Salaam, MD;  Location: [facility] ENDOSCOPY;  Service: Gastroenterology;  Laterality: N/A;   MASTECTOMY, PARTIAL Right 07/30/2017   Procedure: MASTECTOMY PARTIAL;  Surgeon: Marshall Skeeter, MD;  Location: [facility] ORS;  Service: General;  Laterality: Right;   PORTACATH PLACEMENT Left 03/15/2017   Procedure: INSERTION PORT-A-CATH;  Surgeon: Marshall Skeeter, MD;  Location: [facility] ORS;  Service: General;  Laterality: Left;   RE-EXCISION OF BREAST LUMPECTOMY Right 08/17/2017    INVASIVE CARCINOMA EXTENDS TO NEW LATERAL MARGIN. /RE-EXCISION OF BREAST  LUMPECTOMY;: Byrnett, Magali Schmitz, MD;  [facility] ORS; General;  Laterality: Right;   SENTINEL NODE BIOPSY Right 07/30/2017   Procedure: SENTINEL NODE BIOPSY;  Surgeon: Marshall Skeeter, MD;  Location: [facility] ORS;  Service: General;  Laterality: Right;   SIMPLE MASTECTOMY WITH AXILLARY SENTINEL NODE BIOPSY Right 09/28/2017   Procedure: SIMPLE MASTECTOMY;  Surgeon: Marshall Skeeter, MD;  Location: [facility] ORS;  Service: General;  Laterality: Right;   Patient Active Problem List   Diagnosis Date Noted   Osteoarthritis 06/09/2021   Normocytic anemia 06/09/2021   Class 1 obesity due to excess calories with body mass index (BMI) of 31.0 to 31.9 in adult 06/09/2021   History of breast cancer 07/01/2020   Hyperlipidemia associated with type 2 diabetes mellitus (HCC) 10/02/2018   Neuropathy due to chemotherapeutic drug (HCC) 10/02/2018   Controlled type 2 diabetes mellitus with hyperglycemia (HCC) 07/05/2018   Essential hypertension 01/05/2016    ONSET DATE: 2021  REFERRING DIAG: R UE Lymphedema  THERAPY DIAG:  Postmastectomy lymphedema syndrome  Rationale for Evaluation and Treatment: Rehabilitation  SUBJECTIVE:   SUBJECTIVE STATEMENT: I tried to do the bandages since have seen you last time.  Kept it on most all the time.  And changed it.  My elbow got a little irritated in the front.  Since I seen you in 23 I lost 50 pounds.  Feeling the pump is  not working anymore but I trying to use it once or twice a day.  More arm is definitely larger and heavier .  Pt accompanied by: self  PERTINENT HISTORY: Patient was seen 2023 after referred by Dr. Wilhelmenia Harada with right upper extremity and thoracic lymphedema.  As well as increased right shoulder and axilla pain.  Patient was seen by this OT in 2021 for right upper  UE and thoracic lymphedema.  Patient was fitted with a custom Jobst Elvarex soft compression sleeve and glove for daytime as well as a nighttime garment.  Because of thoracic lymphedema patient was also  fitted with a pump and recommended a unilateral postmastectomy  jovi pack breast pad. Then patient was again seen in 2023 for a flareup of right upper extremity and chest lymphedema p At that time patient was fitted with a over-the-counter Harmony Medi sleeve and glove to wear with high risk activities like pulling, pushing and lifting because of husband's health patient do a lot of that at home.  As well as she is a [employer] and has a 54-year-old granddaughter which she picks up at times.  As well as continue to use her pump. Patient returns now after another flareup but also patient lost 50 pounds and referred to lymphedema therapy  PRECAUTIONS: Right upper extremity lymphedema    WEIGHT BEARING RESTRICTIONS: No  PAIN:  Are you having pain?  Some pain in the right shoulder.  Because of arm being heavy  FALLS: Has patient fallen in last 6 months? 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  HAND DOMINANCE: Right   UPPER EXTREMITY ROM:   Patient elbow and wrist and digit active range of motion within functional limits. Patient with decreased shoulder flexion and abduction.  Because of heaviness of upper extremity but also was limited in 2023 and shoulder motion.   COGNITION: Overall cognitive status: Within functional limits for tasks assessed  Patient report 8 lymph nodes was removed-and right mastectomy 2020  LYMPHEDEMA/ONCOLOGY QUESTIONNAIRE - 03/20/24 0001       Right Upper Extremity Lymphedema   15 cm Proximal to Olecranon Process 37.5 cm    10 cm Proximal to Olecranon Process 34.5 cm    Olecranon Process 30.4 cm    15 cm Proximal to Ulnar Styloid Process 28 cm    10 cm Proximal to Ulnar Styloid Process 24 cm    Just Proximal to  Ulnar Styloid Process 20.5 cm    Across Hand at [employer] [address] 19.8 cm                  TREATMENT DATE: 03/20/24  Patient lost 50 pounds since seen in 2023 Her lymphedema pump sleeve not fitting anymore. Patient garment lost since family cleaned her house after her husband and mom passed away But has been older than 2 years Patient had been doing light compression bandaging at home-since have seen her last visit Nighttime lotion on the right upper extremity with Isotoner glove and stockinette followed by Rosidal foam overlapping 50% from hand to upper arm. Followed by 8 cm wrist short stretch bandage at wrist through the hand up to the proximal forearm and then 10 cm short stretch from wrist through the hand overlapping 50% up to axilla.  Patient arrived with bandages off this date.  Measurements taken.  Patient did decrease.  But still increased compared to the left and compared to 2023.  This date patient right upper extremity was bandaged by OT Isotoner glove after Eucerin lotion Soft stockinette Rosidal foam from hand to axilla overlapping 50% 8 cm short stretch from wrist through the hand 2 times and figure eights up to elbow Followed by 10 cm short stretch from wrist 1 time through the hand overlapping 50% to axilla Patient to keep it for 48 hours on can change it 2 times every 2 days and will check with me back on Wednesday to reassess progress of decongestion  Will contact rep about replacement of sleeve for her lymphedema pump or if she is eligible for a new pump.   Try her pump still twice a day     PATIENT EDUCATION: Education details: findings of eval and HEP  Person educated: Patient Education method: Explanation, Demonstration, Tactile cues, Verbal cues, and Handouts Education comprehension: verbalized understanding, returned demonstration,  verbal cues required, and needs further education     LONG TERM GOALS: Target date: 12 wks  Patient to be independent in home program to do light lymphedema compression as well as use of pump to decrease right upper extremity circumference by 2 cm in forearm, elbow and upper arm Baseline: Patient is increased in the upper arm by 3 cm, at the elbow 4 cm and forearm 5.5 cm and wrist 2 cm.  Increase in circumference compared to the left is much more than it was in 2023.  At that time was closer to 1 cm. Goal status: INITIAL  2.  Patient right upper extremity circumference decreased for patient to be fitted with correct compression sleeve and glove to maintain her circumference to prevent infection Baseline: Patient wore in the past Jovi pack custom sleeve as well as [employer].  Patient lost her compression sleeves while family was cleaning her house after her husband and mom passed away but patient also in the meantime lost 50 pounds and pump not fitting Goal status: INITIAL Goal status: INITIAL ASSESSMENT:  CLINICAL IMPRESSION: Patient seeing OT for right upper extremity lymphedema.  Patient had breast cancer in the past with a mastectomy and patient reports 8 lymph nodes removed.  Patient was seen by this OT in 2021 as well as 2023 for right upper extremity lymphedema.  Patient was last fitted with a lymphedema pump as well a Medi Harmony sleeve and glove to wear with high risk activity.  Patient also had nighttime garment in the past.  Patient returns today after she lost her mom and husband in the last year- patient also lost 50 pounds.  Report pump not working anymore.  Patient circumference in right upper extremity compared to the left is increased by 3 cm in upper arm, 4 cm at  the elbow and forearm 5.5 cm.  Wrist is increased by 2 cm in the hand 1.5.  When seen in 2023 patient was increased by only about 1 cm and could only wear compression sleeve with high risk activity.  Patient was doing  since evaluation compression at home.  Did decongest 0.5 cm to 1 cm.  Patient was bandaged by OT to keep on for 48 hours will follow-up with me weekly continue to keep 24/7 compression on.  Patient can benefit from skilled OT services for CDT treatment for decreasing circumference of right upper extremity to decrease and decongest right upper extremity to be fitted with appropriate compression garments to maintain her circumference and prevent infection.  Will contact rep for new lymphedema pump.    PERFORMANCE DEFICITS: in functional skills including ADLs, IADLs, ROM, strength, pain, flexibility, decreased knowledge of use of DME, and UE functional use,   and psychosocial skills including environmental adaptation and routines and behaviors.   IMPAIRMENTS: are limiting patient from ADLs, IADLs, rest and sleep, play, leisure, and social participation.   COMORBIDITIES: has other co-morbidities that affects occupational performance. Patient will benefit from skilled OT to address above impairments and improve overall function.  MODIFICATION OR ASSISTANCE TO COMPLETE EVALUATION: modification of tasks or assist necessary to complete an evaluation.  OT OCCUPATIONAL PROFILE AND HISTORY: Problem focused assessment: Including review of records relating to presenting problem.  CLINICAL DECISION MAKING: MOD - limited treatment options,  task modification necessary  REHAB POTENTIAL: Good for goals  EVALUATION COMPLEXITY: Mod      PLAN:  OT FREQUENCY: 1-2x/week  OT DURATION: 12 weeks  PLANNED INTERVENTIONS: 97168 OT Re-evaluation, 97535 self care/ADL training, 16109 therapeutic exercise, 97530 therapeutic activity, 97140 manual therapy, manual lymph drainage, compression bandaging, patient/family education, and DME and/or AE instructions     CONSULTED AND AGREED WITH PLAN OF CARE: Patient     Heloise Lobo, OTR/L,CLT 03/20/2024, 3:43 PM

## 2024-03-23 DIAGNOSIS — N6489 Other specified disorders of breast: Secondary | ICD-10-CM | POA: Diagnosis not present

## 2024-03-23 DIAGNOSIS — Z853 Personal history of malignant neoplasm of breast: Secondary | ICD-10-CM | POA: Diagnosis not present

## 2024-03-23 DIAGNOSIS — Z08 Encounter for follow-up examination after completed treatment for malignant neoplasm: Secondary | ICD-10-CM | POA: Diagnosis not present

## 2024-03-24 ENCOUNTER — Ambulatory Visit: Admitting: Occupational Therapy

## 2024-03-25 ENCOUNTER — Inpatient Hospital Stay

## 2024-03-26 ENCOUNTER — Ambulatory Visit: Admitting: Occupational Therapy

## 2024-03-26 ENCOUNTER — Inpatient Hospital Stay: Attending: Oncology

## 2024-03-26 DIAGNOSIS — I972 Postmastectomy lymphedema syndrome: Secondary | ICD-10-CM | POA: Diagnosis not present

## 2024-03-26 DIAGNOSIS — Z1721 Progesterone receptor positive status: Secondary | ICD-10-CM | POA: Insufficient documentation

## 2024-03-26 DIAGNOSIS — Z170 Estrogen receptor positive status [ER+]: Secondary | ICD-10-CM | POA: Insufficient documentation

## 2024-03-26 DIAGNOSIS — Z1732 Human epidermal growth factor receptor 2 negative status: Secondary | ICD-10-CM | POA: Diagnosis not present

## 2024-03-26 DIAGNOSIS — C50411 Malignant neoplasm of upper-outer quadrant of right female breast: Secondary | ICD-10-CM | POA: Diagnosis not present

## 2024-03-26 DIAGNOSIS — Z452 Encounter for adjustment and management of vascular access device: Secondary | ICD-10-CM | POA: Insufficient documentation

## 2024-03-26 DIAGNOSIS — Z95828 Presence of other vascular implants and grafts: Secondary | ICD-10-CM

## 2024-03-26 MED ORDER — SODIUM CHLORIDE 0.9% FLUSH
10.0000 mL | Freq: Once | INTRAVENOUS | Status: AC
  Administered 2024-03-26: 10 mL via INTRAVENOUS
  Filled 2024-03-26: qty 10

## 2024-03-26 MED ORDER — HEPARIN SOD (PORK) LOCK FLUSH 100 UNIT/ML IV SOLN
500.0000 [IU] | Freq: Once | INTRAVENOUS | Status: AC
  Administered 2024-03-26: 500 [IU] via INTRAVENOUS
  Filled 2024-03-26: qty 5

## 2024-03-26 NOTE — Therapy (Signed)
 OUTPATIENT OCCUPATIONAL THERAPY Lymphedema TREATMENT  Patient Name: Chelsea Hudson MRN: [id number] DOB:07/31/1966, 58 y.o., female Today's Date: 03/26/2024  PCP: Dr Thalia Filler REFERRING PROVIDER: Dr Wilhelmenia Harada  END OF SESSION:  OT End of Session - 03/26/24 1619     Visit Number 3    Number of Visits 12    Date for OT Re-Evaluation 05/27/24    OT Start Time 1300    OT Stop Time 1332    OT Time Calculation (min) 32 min    Activity Tolerance Patient tolerated treatment well    Behavior During Therapy [facility] for tasks assessed/performed             Past Medical History:  Diagnosis Date   Arthritis    SHOULDER   Breast cancer (HCC) 02/2017   rt breast   Breast cancer (HCC) 2018   Cancer (HCC) 02/28/2017   INVASIVE MAMMARY CARCINOMA WITH MUCINOUS FEATURES.   Colonic diverticular abscess 06/21/2017   Colonoscopy 06/25/2017: No evidence of malignancy.   Diabetes mellitus without complication (HCC)    Hypertension    Irregular heart beat    PT STATES IT "SKIPS A BEAT"    Obesity    Personal history of chemotherapy    Personal history of radiation therapy    Past Surgical History:  Procedure Laterality Date   ANKLE SURGERY     BREAST BIOPSY Right 02/28/2017   INVASIVE MAMMARY CARCINOMA WITH MUCINOUS FEATURES.   BREAST CYST ASPIRATION Right    NEG   COLONOSCOPY WITH PROPOFOL  N/A 06/25/2017   Procedure: COLONOSCOPY WITH PROPOFOL ;  Surgeon: Luke Salaam, MD;  Location: [facility] ENDOSCOPY;  Service: Gastroenterology;  Laterality: N/A;   MASTECTOMY, PARTIAL Right 07/30/2017   Procedure: MASTECTOMY PARTIAL;  Surgeon: Marshall Skeeter, MD;  Location: [facility] ORS;  Service: General;  Laterality: Right;   PORTACATH PLACEMENT Left 03/15/2017   Procedure: INSERTION PORT-A-CATH;  Surgeon: Marshall Skeeter, MD;  Location: [facility] ORS;  Service: General;  Laterality: Left;   RE-EXCISION OF BREAST LUMPECTOMY Right 08/17/2017    INVASIVE CARCINOMA EXTENDS TO NEW LATERAL MARGIN. /RE-EXCISION OF BREAST  LUMPECTOMY;: Byrnett, Magali Schmitz, MD;  [facility] ORS; General;  Laterality: Right;   SENTINEL NODE BIOPSY Right 07/30/2017   Procedure: SENTINEL NODE BIOPSY;  Surgeon: Marshall Skeeter, MD;  Location: [facility] ORS;  Service: General;  Laterality: Right;   SIMPLE MASTECTOMY WITH AXILLARY SENTINEL NODE BIOPSY Right 09/28/2017   Procedure: SIMPLE MASTECTOMY;  Surgeon: Marshall Skeeter, MD;  Location: [facility] ORS;  Service: General;  Laterality: Right;   Patient Active Problem List   Diagnosis Date Noted   Osteoarthritis 06/09/2021   Normocytic anemia 06/09/2021   Class 1 obesity due to excess calories with body mass index (BMI) of 31.0 to 31.9 in adult 06/09/2021   History of breast cancer 07/01/2020   Hyperlipidemia associated with type 2 diabetes mellitus (HCC) 10/02/2018   Neuropathy due to chemotherapeutic drug (HCC) 10/02/2018   Controlled type 2 diabetes mellitus with hyperglycemia (HCC) 07/05/2018   Essential hypertension 01/05/2016    ONSET DATE: 2021  REFERRING DIAG: R UE Lymphedema  THERAPY DIAG:  Postmastectomy lymphedema syndrome  Rationale for Evaluation and Treatment: Rehabilitation  SUBJECTIVE:   SUBJECTIVE STATEMENT: I did find some for old sleeves that was new.  And gloves.  The glass that we put under my bandages caused some intentions and some small blisters.  But is better now.  The arm does feel lighter smaller  pt accompanied by: self  PERTINENT HISTORY: Patient  was seen 2023 after referred by Dr. Wilhelmenia Harada with right upper extremity and thoracic lymphedema.  As well as increased right shoulder and axilla pain.  Patient was seen by this OT in 2021 for right upper  UE and thoracic lymphedema.  Patient was fitted with a custom Jobst Elvarex soft compression sleeve and glove for daytime as well as a nighttime garment.  Because of thoracic lymphedema patient was also fitted with a pump and recommended a unilateral postmastectomy  jovi pack breast pad. Then patient was again seen in  2023 for a flareup of right upper extremity and chest lymphedema p At that time patient was fitted with a over-the-counter Harmony Medi sleeve and glove to wear with high risk activities like pulling, pushing and lifting because of husband's health patient do a lot of that at home.  As well as she is a [employer] and has a 25-year-old granddaughter which she picks up at times.  As well as continue to use her pump. Patient returns now after another flareup but also patient lost 50 pounds and referred to lymphedema therapy  PRECAUTIONS: Right upper extremity lymphedema    WEIGHT BEARING RESTRICTIONS: No  PAIN:  Are you having pain?  Some pain in the right shoulder.  Because of arm being heavy  FALLS: Has patient fallen in last 6 months? 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  HAND DOMINANCE: Right   UPPER EXTREMITY ROM:   Patient elbow and wrist and digit active range of motion within functional limits. Patient with decreased shoulder flexion and abduction.  Because of heaviness of upper extremity but also was limited in 2023 and shoulder motion.   COGNITION: Overall cognitive status: Within functional limits for tasks assessed  Patient report 8 lymph nodes was removed-and right mastectomy 2020  LYMPHEDEMA/ONCOLOGY QUESTIONNAIRE - 03/26/24 0001       Right Upper Extremity Lymphedema   15 cm Proximal to Olecranon Process 37.5 cm    10 cm Proximal to Olecranon Process 33.8 cm    Olecranon Process 29 cm    15 cm Proximal to Ulnar Styloid Process 28.5 cm    10 cm Proximal to Ulnar Styloid Process 24.2 cm    Just Proximal to Ulnar Styloid Process 18.4 cm    Across Hand at [employer] 19 cm                  TREATMENT DATE:  03/26/24                                                                                                                        Patient lost 50 pounds since seen in 2023 Her lymphedema pump sleeve not fitting anymore. Patient arrived today with some  compression sleeves from 2 to 3 years ago but patient lost 50 pounds so they are not the correct compression.  Patient had been doing light compression bandaging at home-since have seen her last visit with Rosidal foam and 8 and 10 cm compression But switched to her old sleeve that she found and wore that with a glove.  Educated patient that sleeve is the right size and not right compression.    Patient arrived with bandages off this date.  Measurements taken.  Patient did decrease in upper arm and wrist and hand compared to last time.   But still increased compared to the left and compared to 2023 in relation with the left upper extremity and elbow and forearm.  This date patient right upper extremity was bandaged by OT Using her Medi Harmony sleeve and glove  With 1 layer 10 cm short stretch from wrist 2 times through the hand with figure eights over the forearm and then overlap 50% to axilla.    Patient can keep it on during the day.  Leave it off for 2 hours in the morning in the evening to put some lotion on Eucerin. At nighttime she can do a stockinette with her glove and a Rosidal foam overlap 50% with 10 cm short stretch the same as daytime. Will follow-up with [facility] about new pump and new compression sleeve.  Try her pump still twice a day     PATIENT EDUCATION: Education details: findings of eval and HEP  Person educated: Patient Education method: Explanation, Demonstration, Tactile cues, Verbal cues, and Handouts Education comprehension: verbalized understanding, returned demonstration, verbal cues required, and needs further education     LONG TERM GOALS: Target date: 12 wks  Patient to be independent in home  program to do light lymphedema compression as well as use of pump to decrease right upper extremity circumference by 2 cm in forearm, elbow and upper arm Baseline: Patient is increased in the upper arm by 3 cm, at the elbow 4 cm and forearm 5.5 cm and wrist 2 cm.  Increase in circumference compared to the left is much more than it was in 2023.  At that time was closer to 1 cm. Goal status: INITIAL  2.  Patient right upper extremity circumference decreased for patient to be fitted with correct compression sleeve and glove to maintain her circumference to prevent infection Baseline: Patient wore in the past Jovi pack custom sleeve as well as [employer].  Patient lost her compression sleeves while family was cleaning her house after her husband and mom passed away but patient also in the meantime lost 50 pounds and pump not fitting Goal status: INITIAL Goal status: INITIAL ASSESSMENT:  CLINICAL IMPRESSION: Patient seeing OT for right upper extremity lymphedema.  Patient had breast cancer in the past with a mastectomy and patient reports 8 lymph nodes removed.  Patient was seen by this OT in 2021 as well as 2023 for right upper extremity lymphedema.  Patient was last fitted with a lymphedema pump as well a Medi Harmony sleeve and glove to wear with high risk activity.  Patient also had nighttime garment in the past.  Patient return at evaluation few weeks ago after she lost her mom and husband in the last year- patient also lost 50 pounds.  Report pump not working anymore.  Patient circumference in right upper extremity compared to the left is increased by 3 cm in upper arm, 4 cm at the elbow and forearm 5.5 cm.  Wrist is increased by 2 cm in the hand 1.5.  When seen in 2023 patient was increased by only about 1 cm and could only wear compression sleeve with high risk activity.  Patient was doing since evaluation compression at home.  Did decongest proximal upper arm and wrist and hand to be ready for  compression garments to be measured.  But elbow and forearm still increased.  Change patient's home program for compression.  See note.  Patient can benefit from skilled OT services for CDT treatment for decreasing circumference of right upper extremity to decrease and decongest right upper extremity to be fitted with appropriate compression garments to maintain her circumference and prevent infection.  Will contact rep for new lymphedema pump.    PERFORMANCE DEFICITS: in functional skills including ADLs, IADLs, ROM, strength, pain, flexibility, decreased knowledge of use of DME, and UE functional use,   and psychosocial skills including environmental adaptation and routines and behaviors.   IMPAIRMENTS: are limiting patient from ADLs, IADLs, rest and sleep, play, leisure, and social participation.   COMORBIDITIES: has other co-morbidities that affects occupational performance. Patient will benefit from skilled OT to address above impairments and improve overall function.  MODIFICATION OR ASSISTANCE TO COMPLETE EVALUATION: modification of tasks or assist necessary to complete an evaluation.  OT OCCUPATIONAL PROFILE AND HISTORY: Problem focused assessment: Including review of records relating to presenting problem.  CLINICAL DECISION MAKING: MOD - limited treatment options,  task modification necessary  REHAB POTENTIAL: Good for goals  EVALUATION COMPLEXITY: Mod      PLAN:  OT FREQUENCY: 1-2x/week  OT DURATION: 12 weeks  PLANNED INTERVENTIONS: 97168 OT Re-evaluation, 97535 self care/ADL training, 13086 therapeutic exercise, 97530 therapeutic activity, 97140 manual therapy, manual lymph drainage, compression bandaging, patient/family education, and DME and/or AE instructions     CONSULTED AND AGREED WITH PLAN OF CARE: Patient     Heloise Lobo, OTR/L,CLT 03/26/2024, 4:21 PM

## 2024-04-02 ENCOUNTER — Inpatient Hospital Stay: Admitting: Occupational Therapy

## 2024-04-17 ENCOUNTER — Other Ambulatory Visit: Payer: Self-pay | Admitting: Internal Medicine

## 2024-04-19 NOTE — Telephone Encounter (Signed)
 Requested Prescriptions  Refused Prescriptions Disp Refills   gabapentin  (NEURONTIN ) 400 MG capsule [Pharmacy Med Name: Gabapentin  400 MG Oral Capsule] 100 capsule 2    Sig: TAKE 1 CAPSULE BY MOUTH AT  BEDTIME     Neurology: Anticonvulsants - gabapentin  Passed - 04/19/2024  2:39 PM      Passed - Cr in normal range and within 360 days    Creat  Date Value Ref Range Status  02/11/2024 0.60 0.50 - 1.03 mg/dL Final   Creatinine, Urine  Date Value Ref Range Status  02/11/2024 53 20 - 275 mg/dL Final         Passed - Completed PHQ-2 or PHQ-9 in the last 360 days      Passed - Valid encounter within last 12 months    Recent Outpatient Visits           2 months ago Encounter for general adult medical examination with abnormal findings   [place] [facility] [place], Rankin Buzzard, NP

## 2024-04-25 DIAGNOSIS — N6489 Other specified disorders of breast: Secondary | ICD-10-CM | POA: Diagnosis not present

## 2024-04-25 DIAGNOSIS — Z170 Estrogen receptor positive status [ER+]: Secondary | ICD-10-CM | POA: Diagnosis not present

## 2024-04-25 DIAGNOSIS — C50411 Malignant neoplasm of upper-outer quadrant of right female breast: Secondary | ICD-10-CM | POA: Diagnosis not present

## 2024-04-28 ENCOUNTER — Other Ambulatory Visit: Payer: Self-pay | Admitting: Internal Medicine

## 2024-04-28 NOTE — Telephone Encounter (Unsigned)
 Copied from CRM [id number]. Topic: Clinical - Medication Refill >> Apr 28, 2024  4:50 PM Sophia H wrote: Medication:  gabapentin  (NEURONTIN ) 400 MG capsule   Has the patient contacted their pharmacy? Yes, pharmacy states no more refills on file   This is the patient's preferred pharmacy:  [facility] - [place] - [address] [place] [address] Phone: [phone] Fax: [phone]   Is this the correct pharmacy for this prescription? Yes If no, delete pharmacy and type the correct one.   Has the prescription been filled recently? Yes  Is the patient out of the medication? No, almost   Has the patient been seen for an appointment in the last year OR does the patient have an upcoming appointment? Yes  Can we respond through MyChart? No, phone call is preferred   Agent: Please be advised that Rx refills may take up to 3 business days. We ask that you follow-up with your pharmacy.

## 2024-04-29 MED ORDER — GABAPENTIN 400 MG PO CAPS: 400.0000 mg | ORAL_CAPSULE | Freq: Every day | ORAL | 0 refills | Status: AC

## 2024-04-29 NOTE — Telephone Encounter (Signed)
 Requested medication (s) are due for refill today: na  Requested medication (s) are on the active medication list: yes  Last refill:  01/29/24 #180 0 refills  Future visit scheduled: no   Notes to clinic:  pharmacy requesting 1 year supply . Do you want to refill for 1 year?     Requested Prescriptions  Pending Prescriptions Disp Refills   gabapentin  (NEURONTIN ) 400 MG capsule 180 capsule 0    Sig: Take 1 capsule (400 mg total) by mouth at bedtime.     Neurology: Anticonvulsants - gabapentin  Passed - 04/29/2024  2:25 PM      Passed - Cr in normal range and within 360 days    Creat  Date Value Ref Range Status  02/11/2024 0.60 0.50 - 1.03 mg/dL Final   Creatinine, Urine  Date Value Ref Range Status  02/11/2024 53 20 - 275 mg/dL Final         Passed - Completed PHQ-2 or PHQ-9 in the last 360 days      Passed - Valid encounter within last 12 months    Recent Outpatient Visits           2 months ago Encounter for general adult medical examination with abnormal findings   [facility], Rankin Buzzard, NP

## 2024-05-12 ENCOUNTER — Ambulatory Visit: Payer: Self-pay | Admitting: *Deleted

## 2024-05-12 NOTE — Telephone Encounter (Signed)
 FYI Only or Action Required?: FYI only for provider.  Patient was last seen in primary care on 02/11/2024 by Antonette Angeline ORN, NP. Called Nurse Triage reporting Ankle Pain. Symptoms began several days ago. Interventions attempted: Nothing. Symptoms are: gradually worsening.  Triage Disposition: See HCP Within 4 Hours (Or PCP Triage)  Patient/caregiver understands and will follow disposition?: Yes                  Copied from CRM [id number]. Topic: Clinical - Red Word Triage >> May 12, 2024  1:12 PM Turkey B wrote: [facility] that prompted transfer to Nurse Triage: pt has severe pain in left ankle and opening up and woozing Reason for Disposition  [1] SEVERE pain (e.g., excruciating, unable to walk) AND [2] not improved after 2 hours of pain medicine  Answer Assessment - Initial Assessment Questions 1. ONSET: When did the pain start?      4 days and 1 day ago felt oozing from left ankle,  liquid clear  2. LOCATION: Where is the pain located?      Left ankle area 3. PAIN: How bad is the pain?    (Scale 1-10; or mild, moderate, severe)  - MILD (1-3): doesn't interfere with normal activities.   - MODERATE (4-7): interferes with normal activities (e.g., work or school) or awakens from sleep, limping.   - SEVERE (8-10): excruciating pain, unable to do any normal activities, unable to walk.      9/10 can walk on foot and ankle . Some sleeping issues with pain  4. WORK OR EXERCISE: Has there been any recent work or exercise that involved this part of the body?      na 5. CAUSE: What do you think is causing the ankle pain?     Not sure  s/p surgery over 30 years ago  6. OTHER SYMPTOMS: Do you have any other symptoms? (e.g., calf pain, rash, fever, swelling)     Scabbed and swelling left ankle. Oozing from left ankle scab clear liquid. Severe pain hx DM 7. PREGNANCY: Is there any chance you are pregnant? When was your last menstrual period?     Na   Recommended UC  due to hx DM no appt today or tomorrow.  Protocols used: Ankle Pain-A-AH

## 2024-05-14 ENCOUNTER — Ambulatory Visit: Admitting: Internal Medicine

## 2024-05-16 DIAGNOSIS — I972 Postmastectomy lymphedema syndrome: Secondary | ICD-10-CM | POA: Diagnosis not present

## 2024-05-16 DIAGNOSIS — L089 Local infection of the skin and subcutaneous tissue, unspecified: Secondary | ICD-10-CM | POA: Diagnosis not present

## 2024-05-16 DIAGNOSIS — M25572 Pain in left ankle and joints of left foot: Secondary | ICD-10-CM | POA: Diagnosis not present

## 2024-05-20 ENCOUNTER — Inpatient Hospital Stay: Attending: Oncology

## 2024-07-02 ENCOUNTER — Other Ambulatory Visit: Payer: Self-pay | Admitting: Internal Medicine

## 2024-07-04 NOTE — Telephone Encounter (Signed)
 Requested Prescriptions  Refused Prescriptions Disp Refills   gabapentin  (NEURONTIN ) 400 MG capsule [Pharmacy Med Name: Gabapentin  400 MG Oral Capsule] 80 capsule 3    Sig: TAKE 1 CAPSULE BY MOUTH AT  BEDTIME     Neurology: Anticonvulsants - gabapentin  Passed - 07/04/2024  4:24 PM      Passed - Cr in normal range and within 360 days    Creat  Date Value Ref Range Status  02/11/2024 0.60 0.50 - 1.03 mg/dL Final   Creatinine, Urine  Date Value Ref Range Status  02/11/2024 53 20 - 275 mg/dL Final         Passed - Completed PHQ-2 or PHQ-9 in the last 360 days      Passed - Valid encounter within last 12 months    Recent Outpatient Visits           4 months ago Encounter for general adult medical examination with abnormal findings    [facility] [place], Angeline ORN, NP

## 2024-07-14 NOTE — Congregational Nurse Program (Signed)
  Dept: [phone]   Congregational Nurse Program Note  Date of Encounter: 07/12/2024  Past Medical History: Past Medical History:  Diagnosis Date   Arthritis    SHOULDER   Breast cancer (HCC) 02/2017   rt breast   Breast cancer (HCC) 2018   Cancer (HCC) 02/28/2017   INVASIVE MAMMARY CARCINOMA WITH MUCINOUS FEATURES.   Colonic diverticular abscess 06/21/2017   Colonoscopy 06/25/2017: No evidence of malignancy.   Diabetes mellitus without complication (HCC)    Hypertension    Irregular heart beat    PT STATES IT SKIPS A BEAT    Obesity    Personal history of chemotherapy    Personal history of radiation therapy     Encounter Details:  Community Questionnaire - 07/12/24 1300       Questionnaire   Ask client: Do you give verbal consent for me to treat you today? Yes    Student Assistance N/A    Location Patient Served  [facility]    Encounter Setting Other   [employer] event   Population Status Unknown    Insurance [place];Medicare    Insurance/Financial Assistance Referral N/A    Medication N/A    Medical Provider Yes    Screening Referrals Made N/A    Medical Referrals Made N/A    Medical Appointment Completed N/A    CNP Interventions Advocate/Support    Screenings CN Performed Blood Pressure    ED Visit Averted N/A    Life-Saving Intervention Made N/A          Today's Vitals   07/12/24 1300  BP: 125/67  Pulse: (!) 104  SpO2: 98%   There is no height or weight on file to calculate BMI.

## 2024-08-05 ENCOUNTER — Inpatient Hospital Stay: Attending: Oncology

## 2024-08-05 DIAGNOSIS — Z87891 Personal history of nicotine dependence: Secondary | ICD-10-CM | POA: Insufficient documentation

## 2024-08-05 DIAGNOSIS — Z1732 Human epidermal growth factor receptor 2 negative status: Secondary | ICD-10-CM | POA: Insufficient documentation

## 2024-08-05 DIAGNOSIS — C50411 Malignant neoplasm of upper-outer quadrant of right female breast: Secondary | ICD-10-CM | POA: Insufficient documentation

## 2024-08-05 DIAGNOSIS — Z9221 Personal history of antineoplastic chemotherapy: Secondary | ICD-10-CM | POA: Insufficient documentation

## 2024-08-05 DIAGNOSIS — Z923 Personal history of irradiation: Secondary | ICD-10-CM | POA: Insufficient documentation

## 2024-08-05 DIAGNOSIS — D649 Anemia, unspecified: Secondary | ICD-10-CM | POA: Insufficient documentation

## 2024-08-05 DIAGNOSIS — Z79811 Long term (current) use of aromatase inhibitors: Secondary | ICD-10-CM | POA: Insufficient documentation

## 2024-08-05 DIAGNOSIS — Z1721 Progesterone receptor positive status: Secondary | ICD-10-CM | POA: Insufficient documentation

## 2024-08-05 DIAGNOSIS — Z170 Estrogen receptor positive status [ER+]: Secondary | ICD-10-CM | POA: Insufficient documentation

## 2024-08-05 DIAGNOSIS — Z9011 Acquired absence of right breast and nipple: Secondary | ICD-10-CM | POA: Insufficient documentation

## 2024-08-05 DIAGNOSIS — Z79899 Other long term (current) drug therapy: Secondary | ICD-10-CM | POA: Insufficient documentation

## 2024-08-06 DIAGNOSIS — I890 Lymphedema, not elsewhere classified: Secondary | ICD-10-CM | POA: Diagnosis not present

## 2024-08-12 ENCOUNTER — Ambulatory Visit (INDEPENDENT_AMBULATORY_CARE_PROVIDER_SITE_OTHER): Admitting: Internal Medicine

## 2024-08-12 ENCOUNTER — Inpatient Hospital Stay (HOSPITAL_BASED_OUTPATIENT_CLINIC_OR_DEPARTMENT_OTHER): Admitting: Oncology

## 2024-08-12 ENCOUNTER — Inpatient Hospital Stay

## 2024-08-12 ENCOUNTER — Encounter: Payer: Self-pay | Admitting: Oncology

## 2024-08-12 ENCOUNTER — Encounter: Payer: Self-pay | Admitting: Internal Medicine

## 2024-08-12 VITALS — BP 114/64 | Ht 66.0 in | Wt 191.4 lb

## 2024-08-12 VITALS — BP 126/82 | HR 89 | Temp 97.7°F | Resp 18 | Wt 190.7 lb

## 2024-08-12 DIAGNOSIS — Z1721 Progesterone receptor positive status: Secondary | ICD-10-CM | POA: Diagnosis not present

## 2024-08-12 DIAGNOSIS — E785 Hyperlipidemia, unspecified: Secondary | ICD-10-CM

## 2024-08-12 DIAGNOSIS — E876 Hypokalemia: Secondary | ICD-10-CM

## 2024-08-12 DIAGNOSIS — I10 Essential (primary) hypertension: Secondary | ICD-10-CM

## 2024-08-12 DIAGNOSIS — G620 Drug-induced polyneuropathy: Secondary | ICD-10-CM | POA: Diagnosis not present

## 2024-08-12 DIAGNOSIS — E66811 Obesity, class 1: Secondary | ICD-10-CM | POA: Diagnosis not present

## 2024-08-12 DIAGNOSIS — E1165 Type 2 diabetes mellitus with hyperglycemia: Secondary | ICD-10-CM

## 2024-08-12 DIAGNOSIS — M170 Bilateral primary osteoarthritis of knee: Secondary | ICD-10-CM

## 2024-08-12 DIAGNOSIS — Z95828 Presence of other vascular implants and grafts: Secondary | ICD-10-CM

## 2024-08-12 DIAGNOSIS — E11622 Type 2 diabetes mellitus with other skin ulcer: Secondary | ICD-10-CM

## 2024-08-12 DIAGNOSIS — Z853 Personal history of malignant neoplasm of breast: Secondary | ICD-10-CM | POA: Diagnosis not present

## 2024-08-12 DIAGNOSIS — Z79811 Long term (current) use of aromatase inhibitors: Secondary | ICD-10-CM

## 2024-08-12 DIAGNOSIS — C50411 Malignant neoplasm of upper-outer quadrant of right female breast: Secondary | ICD-10-CM | POA: Diagnosis not present

## 2024-08-12 DIAGNOSIS — Z87891 Personal history of nicotine dependence: Secondary | ICD-10-CM | POA: Diagnosis not present

## 2024-08-12 DIAGNOSIS — I700 Atherosclerosis of aorta: Secondary | ICD-10-CM

## 2024-08-12 DIAGNOSIS — Z6830 Body mass index (BMI) 30.0-30.9, adult: Secondary | ICD-10-CM

## 2024-08-12 DIAGNOSIS — Z170 Estrogen receptor positive status [ER+]: Secondary | ICD-10-CM

## 2024-08-12 DIAGNOSIS — D649 Anemia, unspecified: Secondary | ICD-10-CM

## 2024-08-12 DIAGNOSIS — Z9221 Personal history of antineoplastic chemotherapy: Secondary | ICD-10-CM | POA: Diagnosis not present

## 2024-08-12 DIAGNOSIS — Z79899 Other long term (current) drug therapy: Secondary | ICD-10-CM | POA: Diagnosis not present

## 2024-08-12 DIAGNOSIS — E1169 Type 2 diabetes mellitus with other specified complication: Secondary | ICD-10-CM

## 2024-08-12 DIAGNOSIS — T451X5A Adverse effect of antineoplastic and immunosuppressive drugs, initial encounter: Secondary | ICD-10-CM

## 2024-08-12 DIAGNOSIS — Z9011 Acquired absence of right breast and nipple: Secondary | ICD-10-CM | POA: Diagnosis not present

## 2024-08-12 DIAGNOSIS — Z7984 Long term (current) use of oral hypoglycemic drugs: Secondary | ICD-10-CM | POA: Diagnosis not present

## 2024-08-12 DIAGNOSIS — Z923 Personal history of irradiation: Secondary | ICD-10-CM | POA: Diagnosis not present

## 2024-08-12 DIAGNOSIS — Z1732 Human epidermal growth factor receptor 2 negative status: Secondary | ICD-10-CM | POA: Diagnosis not present

## 2024-08-12 LAB — RETIC PANEL
Immature Retic Fract: 9.9 % (ref 2.3–15.9)
RBC.: 4.28 MIL/uL (ref 3.87–5.11)
Retic Count, Absolute: 52.2 K/uL (ref 19.0–186.0)
Retic Ct Pct: 1.2 % (ref 0.4–3.1)
Reticulocyte Hemoglobin: 26.1 pg — ABNORMAL LOW (ref >27.9)

## 2024-08-12 LAB — CBC WITH DIFFERENTIAL/PLATELET
Abs Immature Granulocytes: 0.05 K/uL (ref 0.00–0.07)
Basophils Absolute: 0 K/uL (ref 0.0–0.1)
Basophils Relative: 1 %
Eosinophils Absolute: 0.1 K/uL (ref 0.0–0.5)
Eosinophils Relative: 2 %
HCT: 33.5 % — ABNORMAL LOW (ref 36.0–46.0)
Hemoglobin: 10.4 g/dL — ABNORMAL LOW (ref 12.0–15.0)
Immature Granulocytes: 1 %
Lymphocytes Relative: 29 %
Lymphs Abs: 1.9 K/uL (ref 0.7–4.0)
MCH: 24.2 pg — ABNORMAL LOW (ref 26.0–34.0)
MCHC: 31 g/dL (ref 30.0–36.0)
MCV: 78.1 fL — ABNORMAL LOW (ref 80.0–100.0)
Monocytes Absolute: 0.4 K/uL (ref 0.1–1.0)
Monocytes Relative: 6 %
Neutro Abs: 4 K/uL (ref 1.7–7.7)
Neutrophils Relative %: 61 %
Platelets: 276 K/uL (ref 150–400)
RBC: 4.29 MIL/uL (ref 3.87–5.11)
RDW: 15.6 % — ABNORMAL HIGH (ref 11.5–15.5)
WBC: 6.5 K/uL (ref 4.0–10.5)
nRBC: 0 % (ref 0.0–0.2)

## 2024-08-12 LAB — IRON AND TIBC
Iron: 40 ug/dL (ref 28–170)
Saturation Ratios: 17 % (ref 10.4–31.8)
TIBC: 238 ug/dL — ABNORMAL LOW (ref 250–450)
UIBC: 198 ug/dL

## 2024-08-12 LAB — COMPREHENSIVE METABOLIC PANEL WITH GFR
ALT: 25 U/L (ref 0–44)
AST: 23 U/L (ref 15–41)
Albumin: 3.1 g/dL — ABNORMAL LOW (ref 3.5–5.0)
Alkaline Phosphatase: 125 U/L (ref 38–126)
Anion gap: 7 (ref 5–15)
BUN: 18 mg/dL (ref 6–20)
CO2: 25 mmol/L (ref 22–32)
Calcium: 9.2 mg/dL (ref 8.9–10.3)
Chloride: 103 mmol/L (ref 98–111)
Creatinine, Ser: 0.86 mg/dL (ref 0.44–1.00)
GFR, Estimated: >60 mL/min (ref >60)
Glucose, Bld: 126 mg/dL — ABNORMAL HIGH (ref 70–99)
Potassium: 3.7 mmol/L (ref 3.5–5.1)
Sodium: 135 mmol/L (ref 135–145)
Total Bilirubin: 0.4 mg/dL (ref 0.0–1.2)
Total Protein: 6.8 g/dL (ref 6.5–8.1)

## 2024-08-12 LAB — FERRITIN: Ferritin: 165 ng/mL (ref 11–307)

## 2024-08-12 LAB — VITAMIN B12: Vitamin B-12: 585 pg/mL (ref 180–914)

## 2024-08-12 MED ORDER — LETROZOLE 2.5 MG PO TABS: 2.5000 mg | ORAL_TABLET | Freq: Every day | ORAL | 1 refills | Status: AC

## 2024-08-12 MED ORDER — ASPIRIN 81 MG PO TBEC: 81.0000 mg | DELAYED_RELEASE_TABLET | Freq: Every day | ORAL | Status: AC

## 2024-08-12 NOTE — Assessment & Plan Note (Signed)
 Encouraged diet and exercise for weight loss ?

## 2024-08-12 NOTE — Assessment & Plan Note (Signed)
 She is will have Mediport removed along with right breast re-construction, procedure scheduled in October. Refer to plastic surgeon

## 2024-08-12 NOTE — Assessment & Plan Note (Signed)
 Encourage weight loss as this can help reduce joint pain Ok to take tylenol  OTC if needed

## 2024-08-12 NOTE — Assessment & Plan Note (Signed)
 DEXA 02/04/2024 -normalPlan repeat Recommend patient to take calcium  1200mg  and vitamin D  supplementation.

## 2024-08-12 NOTE — Assessment & Plan Note (Addendum)
 History of right breast cancer[ 2018] S/p right mastectomy, letrozole  since 05/2018 Clinically, she is doing well.  Continue annual screen mammogram of left breast - March 2025 Labs are reviewed and discussed with patient.  Continue Letrozole  2.5mg  daily-plan extended endocrine therapy.

## 2024-08-12 NOTE — Progress Notes (Signed)
 Subjective:    Patient ID: Chelsea Hudson, female    DOB: 10/10/1966, 58 y.o.   MRN: [id number]  HPI  Patient presents to clinic today for follow-up of chronic conditions.  HTN: Her BP today is 114/64.  She is taking triamterene  HCT, lisinopril  and potassium as prescribed.  ECG from 12/2017 reviewed.  OA: Mainly in her her right shoulder.  She does not take any medication OTC for this.  She does not follow with orthopedics.  History of right breast cancer: Status post mastectomy, lymph node excision, chemo and radiation.  She is taking letrozole  as prescribed.  She follows with oncology.  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  She does not follow with neurology.  DM2: Her last A1c was 6.6 %, 01/2024.  She is taking metformin  as prescribed.  She does not check her sugars.  She checks her feet routinely and reports an open wound to her left ankle.  Her last eye exam was 10/2023.  She does not take flu, pneumovax, prevnar or COVID vaccines.  HLD with aortic atherosclerosis: Her last LDL was 55, triglycerides 93, 01/2024.  She denies myalgias on atorvastatin .  She is not taking any aspirin  at this time.  She does not consume a low-fat diet.  Anemia: Her last H/H was 10.8/34.2, 01/2024.  She is taking an oral iron .  She does not follow with hematology but does follow with oncology.   Review of Systems     Past Medical History:  Diagnosis Date   Arthritis    SHOULDER   Breast cancer (HCC) 02/2017   rt breast   Breast cancer (HCC) 2018   Cancer (HCC) 02/28/2017   INVASIVE MAMMARY CARCINOMA WITH MUCINOUS FEATURES.   Colonic diverticular abscess 06/21/2017   Colonoscopy 06/25/2017: No evidence of malignancy.   Diabetes mellitus without complication (HCC)    Hypertension    Irregular heart beat    PT STATES IT SKIPS A BEAT    Obesity    Personal history of chemotherapy    Personal history of radiation therapy     Current Outpatient Medications   Medication Sig Dispense Refill   atorvastatin  (LIPITOR) 10 MG tablet TAKE ONE TABLET (10 MG TOTAL) BY MOUTH DAILY AT 5PM PLEASE SCHEDULE AN OFFICE VISIT BEFORE ANYMORE REFILLS 90 tablet 1   Calcium  Carbonate-Vitamin D  (CALTRATE 600+D PO) Take by mouth.     cyanocobalamin  (VITAMIN B12) 1000 MCG tablet Take 1 tablet (1,000 mcg total) by mouth daily. 90 tablet 1   ferrous sulfate 325 (65 FE) MG tablet Take 325 mg by mouth 2 (two) times daily with a meal.     gabapentin  (NEURONTIN ) 400 MG capsule Take 1 capsule (400 mg total) by mouth at bedtime. 180 capsule 0   letrozole  (FEMARA ) 2.5 MG tablet TAKE ONE TABLET BY MOUTH DAILY 100 tablet 2   lisinopril  (ZESTRIL ) 5 MG tablet TAKE ONE TABLET (5 MG TOTAL) BY MOUTH DAILY AT 9AM PLEASE SCHEDULE AN OFFICE VISIT BEFORE ANYMORE REFILLS 90 tablet 1   metFORMIN  (GLUCOPHAGE ) 500 MG tablet TAKE TWO TABLETS BY MOUTH DAILY AT 9AM WITH BREAKFAST and TAKE ONE TABLET BY MOUTH DAILY AT 5PM WITH SUPPER. PLEASE SCHEDULE AN OFFICE VISIT BEFORE ANYMORE REFILLS 270 tablet 1   potassium chloride  (KLOR-CON ) 10 MEQ tablet TAKE ONE TABLET BY MOUTH TWICE DAILY @ 9AM & 5PM. PLEASE SCHEDULE AN OFFICE VISIT BEFORE ANYMORE REFILLS 180 tablet 1   triamterene -hydrochlorothiazide (MAXZIDE-25) 37.5-25 MG tablet TAKE ONE TABLET BY MOUTH  DAILY AT 9AM PLEASE SCHEDULE AN OFFICE VISIT BEFORE ANYMORE REFILLS 90 tablet 1   No current facility-administered medications for this visit.    Allergies  Allergen Reactions   Other Hives and Itching    [name]     Family History  Problem Relation Age of Onset   Diabetes Father    Stroke Father    Hypertension Father    Hypertension Mother    Brain cancer Maternal Aunt 56   Diabetes Sister    Colon cancer Neg Hx    Breast cancer Neg Hx     Social History   Socioeconomic History   Marital status: Married    Spouse name: Not on file    Number of children: Not on file   Years of education: Not on file   Highest education level: Not on file  Occupational History   Not on file  Tobacco Use   Smoking status: Former    Current packs/day: 0.00    Average packs/day: 0.3 packs/day for 25.0 years (6.3 ttl pk-yrs)    Types: Cigarettes    Start date: 10/20/1992    Quit date: 10/20/2017    Years since quitting: 6.8   Smokeless tobacco: Former  Building services engineer status: Never Used  Substance and Sexual Activity   Alcohol use: Not Currently    Comment: occas   Drug use: No   Sexual activity: Not Currently  Other Topics Concern   Not on file  Social History Narrative   Not on file   Social Drivers of Health   Financial Resource Strain: Low Risk  (03/14/2024)   Overall Financial Resource Strain (CARDIA)    Difficulty of Paying Living Expenses: Not hard at all  Food Insecurity: No Food Insecurity (03/14/2024)   Hunger Vital Sign    Worried About Running Out of Food in the Last Year: Never true    Ran Out of Food in the Last Year: Never true  Transportation Needs: No Transportation Needs (03/14/2024)   PRAPARE - Administrator, Civil Service (Medical): No    Lack of Transportation (Non-Medical): No  Physical Activity: Inactive (03/14/2024)   Exercise Vital Sign    Days of Exercise per Week: 0 days    Minutes of Exercise per Session: 0 min  Stress: No Stress Concern Present (03/14/2024)   [employer] of Occupational Health - Occupational Stress Questionnaire    Feeling of Stress : Not at all  Social Connections: Socially Integrated (03/14/2024)   Social Connection and Isolation Panel    Frequency of Communication with Friends and Family: More than three times a week    Frequency of Social Gatherings with Friends and Family: Twice a week    Attends Religious Services: More than 4 times per year    Active Member of [employer] or Organizations: Yes    Attends Engineer, structural: More than 4 times per  year    Marital Status: Married  Catering manager Violence: Not At Risk (03/14/2024)   Humiliation, Afraid, Rape, and Kick questionnaire    Fear of Current or Ex-Partner: No    Emotionally Abused: No    Physically Abused: No    Sexually Abused: No     Constitutional: Denies fever, malaise, fatigue, headache or abrupt weight changes.  HEENT: Denies eye pain, eye redness, ear pain, ringing in the ears, wax buildup,  runny nose, nasal congestion, bloody nose, or sore throat. Respiratory: Denies difficulty breathing, shortness of breath, cough or sputum production.   Cardiovascular: Pt reports swelling in legs. Denies chest pain, chest tightness, palpitations or swelling in the hands.  Gastrointestinal: Denies abdominal pain, bloating, constipation, diarrhea or blood in the stool.  GU: Denies urgency, frequency, pain with urination, burning sensation, blood in urine, odor or discharge. Musculoskeletal: Patient reports chronic right shoulder pain.  Denies decrease in range of motion, difficulty with gait, muscle pain or joint swelling.  Skin: Pt reports open would to left ankle. Denies redness, rashes, lesions or ulcercations.  Neurological: Patient reports neuropathic pain.  Denies dizziness, difficulty with memory, difficulty with speech or problems with balance and coordination.  Psych: Denies anxiety, depression, SI/HI.  No other specific complaints in a complete review of systems (except as listed in HPI above).  Objective:   Physical Exam  BP 114/64 (BP Location: Left Arm, Patient Position: Sitting, Cuff Size: Normal)   Ht 5' 6 (1.676 m)   Wt 191 lb 6.4 oz (86.8 kg)   LMP 01/31/2014 (Approximate) Comment: LMP was 3 years ago.  BMI 30.89 kg/m   Wt Readings from Last 3 Encounters:  02/11/24 193 lb 3.2 oz (87.6 kg)  02/05/24 190 lb 9.6 oz (86.5 kg)  01/29/24 194 lb 12.8 oz (88.4 kg)    General: Appears her stated age, obese, in NAD. Skin: Warm, dry and intact. 3 cm open ulcer  with clean wound base noted to left lateral leg.  HEENT: Head: normal shape and size; Eyes: sclera white, no icterus, conjunctiva pink, PERRLA and EOMs intact;  Cardiovascular: Normal rate and rhythm. S1,S2 noted.  No murmur, rubs or gallops noted. No JVD.  Trace pitting BLE edema. No carotid bruits noted. Pulmonary/Chest: Normal effort and positive vesicular breath sounds. No respiratory distress. No wheezes, rales or ronchi noted.  Musculoskeletal: No difficulty with gait.  Neurological: Alert and oriented. Coordination normal.  Psychiatric: Mood and affect normal. Behavior is normal. Judgment and thought content normal.    BMET    Component Value Date/Time   NA 141 02/11/2024 1527   K 4.3 02/11/2024 1527   CL 105 02/11/2024 1527   CO2 26 02/11/2024 1527   GLUCOSE 140 (H) 02/11/2024 1527   BUN 17 02/11/2024 1527   CREATININE 0.60 02/11/2024 1527   CALCIUM  10.2 02/11/2024 1527   GFRNONAA >60 01/29/2024 1315   GFRNONAA 77 03/22/2020 0802   GFRAA 89 03/22/2020 0802    Lipid Panel     Component Value Date/Time   CHOL 107 02/11/2024 1527   CHOL 168 03/01/2021 1649   TRIG 93 02/11/2024 1527   HDL 34 (L) 02/11/2024 1527   HDL 33 (L) 03/01/2021 1649   CHOLHDL 3.1 02/11/2024 1527   VLDL 23 04/04/2017 0941   LDLCALC 55 02/11/2024 1527    CBC    Component Value Date/Time   WBC 7.3 02/11/2024 1527   RBC 4.46 02/11/2024 1527   HGB 10.8 (L) 02/11/2024 1527   HGB 10.0 (L) 01/29/2024 1315   HCT 34.2 (L) 02/11/2024 1527   PLT 353 02/11/2024 1527   PLT 295 01/29/2024 1315   MCV 76.7 (L) 02/11/2024 1527   MCH 24.2 (L) 02/11/2024 1527   MCHC 31.6 (L) 02/11/2024 1527   RDW 14.9 02/11/2024 1527   LYMPHSABS 1.4 01/29/2024 1315   MONOABS 0.3 01/29/2024 1315   EOSABS 0.1 01/29/2024 1315   BASOSABS 0.0 01/29/2024 1315    Hgb A1C Lab  Results  Component Value Date   HGBA1C 6.6 (H) 02/11/2024           Assessment & Plan:   Diabetic ulcer, left lower extremity:  Clean  with soap and water, avoid excessive rubbing Apply neosporin BID, keep covered at night Referral to wound clinic for further evaluation and treatment   RTC in 6 months, follow-up chronic conditions Angeline Laura, NP

## 2024-08-12 NOTE — Progress Notes (Signed)
 Hematology/Oncology Progress note [phone] [phone]     Clinic Day:  08/12/2024   Referring physician: Antonette Angeline ORN, NP  Assessment & Plan:   Malignant neoplasm of upper-outer quadrant of right breast in female, estrogen receptor positive (HCC) History of right breast cancer[ 2018] S/p right mastectomy, letrozole  since 05/2018 Clinically, she is doing well.  Continue annual screen mammogram of left breast - March 2025 Labs are reviewed and discussed with patient.  Continue Letrozole  2.5mg  daily-plan extended endocrine therapy.    Normocytic anemia Lab Results  Component Value Date   HGB 10.4 (L) 08/12/2024   TIBC 238 (L) 08/12/2024   IRONPCTSAT 17 08/12/2024   FERRITIN 165 08/12/2024   Iron  panel is not typical for iron  deficiency.  Vitamin B12 level has improved. Multiple myeloma panel and light chain ratio levels are pending.   Aromatase inhibitor use DEXA 02/04/2024 -normalPlan repeat Recommend patient to take calcium  1200mg  and vitamin D  supplementation.   Port-A-Cath in place She is will have Mediport removed along with right breast re-construction, procedure scheduled in October. Refer to plastic surgeon   The patient understands the plans discussed today and is in agreement with them.  She knows to contact our office if she develops concerns prior to her next appointment.  Follow up 6 months.    Zelphia Cap, MD  Orders Placed This Encounter  Procedures   MM 3D SCREENING MAMMOGRAM UNILATERAL LEFT BREAST    Standing Status:   Future    Expected Date:   01/20/2025    Expiration Date:   08/12/2025    Reason for Exam (SYMPTOM  OR DIAGNOSIS REQUIRED):   Breast cancer    Preferred imaging location?:   [facility]    Is the patient pregnant?:   No   CBC with Differential (Cancer Center Only)    Standing Status:   Future    Expected Date:   01/26/2025    Expiration Date:   04/26/2025   CMP (Cancer Center only)    Standing Status:    Future    Expected Date:   01/26/2025    Expiration Date:   04/26/2025      Chief complaints: follow up for history of breast cancer.   HPI:  Patient previously followed up by Dr.Corcoran, patient switched care to me on 05/09/22 Extensive medical record review was performed by me  - stage IIIC invasive carcinoma of the upper outer quadrant of the right breast s/p neoadjuvant chemotherapy, surgery, and radiation.  Biopsy on 02/28/2017 revealed invasive carcinoma with mucinous features.  Lymph node biopsy revealed metastatic disease.  Tumor was ER positive (90%), PR positive (90%) and HER-2/neu negative.  She had clinical stage [id number] breast cancer.   PET scan on 03/09/2017 revealed hypermetabolic right axillary/subpectoral adenopathy.  There was low-grade activity in the right upper breast likely at the postoperative site.  Appearance was compatible with metastatic spread to right axillary/subpectoral lymph nodes.  There was some sigmoid colon diverticulosis, with faint inflammatory findings adjacent to the sigmoid colon proximally, and with accentuated activity in the involved segment of the sigmoid colon.    Partial mastectomy and sentinel lymph node biopsy on 07/30/2017 revealed a 1.8 cm invasive mammary carcinoma with mucinous features.  There was lymphovascular invasion.  Eight of 12 lymph nodes were positive for metastatic disease.   She required multiple excisions to obtain clear margins resulting in a full mastectomy on 09/28/2017.  She underwent complete axillary node dissection on 03/04/2018.  Level 2 and  3 lymph node dissection revealed 3 of 3 lymph nodes positive for carcinoma.  The largest tumor deposit was at least 20 mm.  Extracapsular extension was present < 1 mm beyond the lymph node capsule.   She received 4 cycles of AC (03/22/2017 - 05/02/2017) and 7 of 12 cycles of neoadjuvant Taxol  (05/16/2017 - 06/27/2017).  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   She received adjuvant radiation at [facility].  She began Letrozole  in 05/2018.  Patient reports feeling well. Occasionally she has sharp pain at the previous mastectomy site. Otherwise she has no new complaints.  She tolerates Letrozole , with no side effects.  01/18/22 left screening mammogram negative.    INTERVAL HISTORY Chelsea Hudson is a 58 y.o. female who has above history reviewed by me today presents for follow up visit for history of Stage III right breast cancer.  Patient takes letrozole  2.5 mg daily.  She tolerates well. + occasional pain around her right mastectomy site, intermittent, usually triggered with stretching.  She has no new breast concerns.   Review of Systems  Constitutional:  Negative for appetite change, chills, fatigue and fever.  HENT:   Negative for hearing loss and voice change.   Eyes:  Negative for eye problems.  Respiratory:  Negative for chest tightness and cough.   Cardiovascular:  Negative for chest pain.  Gastrointestinal:  Negative for abdominal distention, abdominal pain and blood in stool.  Endocrine: Negative for hot flashes.  Genitourinary:  Negative for difficulty urinating and frequency.   Musculoskeletal:  Negative for arthralgias.  Skin:  Negative for itching and rash.  Neurological:  Negative for extremity weakness.  Hematological:  Negative for adenopathy.  Psychiatric/Behavioral:  Negative for confusion.    VITALS Blood pressure 126/82, pulse 89, temperature 97.7 F (36.5 C), temperature source Tympanic, resp. rate 18, weight 190 lb 11.2 oz (86.5 kg), last menstrual period 01/31/2014, SpO2 100%.  Wt Readings from Last 3 Encounters:  08/12/24 190 lb 11.2 oz (86.5 kg)  08/12/24 191 lb 6.4 oz (86.8 kg)  02/11/24 193 lb 3.2 oz (87.6 kg)    Body mass index is 30.78 kg/m.  Performance status (ECOG): 0 - Asymptomatic  Physical Exam Constitutional:      General: She is not in acute distress.    Appearance: She is not diaphoretic.  HENT:      Head: Normocephalic and atraumatic.  Eyes:     General: No scleral icterus. Cardiovascular:     Rate and Rhythm: Normal rate.  Pulmonary:     Effort: Pulmonary effort is normal. No respiratory distress.  Abdominal:     General: There is no distension.     Palpations: Abdomen is soft.  Musculoskeletal:        General: Normal range of motion.     Cervical back: Normal range of motion and neck supple.  Skin:    General: Skin is warm and dry.     Findings: No erythema.  Neurological:     Mental Status: She is alert and oriented to person, place, and time. Mental status is at baseline.     Motor: No abnormal muscle tone.  Psychiatric:        Mood and Affect: Mood and affect normal.     LABS:      Latest Ref Rng & Units 08/12/2024    2:00 PM 02/11/2024    3:27 PM 01/29/2024    1:15 PM  CBC  WBC 4.0 - 10.5 K/uL 6.5  7.3  6.3   Hemoglobin 12.0 - 15.0 g/dL 89.5  89.1  89.9   Hematocrit 36.0 - 46.0 % 33.5  34.2  32.0   Platelets 150 - 400 K/uL 276  353  295       Latest Ref Rng & Units 08/12/2024    2:00 PM 02/11/2024    3:27 PM 01/29/2024    1:15 PM  CMP  Glucose 70 - 99 mg/dL 873  859  770   BUN 6 - 20 mg/dL 18  17  15    Creatinine 0.44 - 1.00 mg/dL 9.13  9.39  9.18   Sodium 135 - 145 mmol/L 135  141  138   Potassium 3.5 - 5.1 mmol/L 3.7  4.3  3.3   Chloride 98 - 111 mmol/L 103  105  104   CO2 22 - 32 mmol/L 25  26  25    Calcium  8.9 - 10.3 mg/dL 9.2  89.7  9.0   Total Protein 6.5 - 8.1 g/dL 6.8  7.0  6.9   Total Bilirubin 0.0 - 1.2 mg/dL 0.4  0.2  0.2   Alkaline Phos 38 - 126 U/L 125   114   AST 15 - 41 U/L 23  15  22    ALT 0 - 44 U/L 25  16  22      STUDIES:  No results found.    HISTORY:   Past Medical History:  Diagnosis Date   Arthritis    SHOULDER   Breast cancer (HCC) 02/2017   rt breast   Breast cancer (HCC) 2018   Cancer (HCC) 02/28/2017   INVASIVE MAMMARY CARCINOMA WITH MUCINOUS FEATURES.   Colonic diverticular abscess 06/21/2017   Colonoscopy  06/25/2017: No evidence of malignancy.   Diabetes mellitus without complication (HCC)    Hypertension    Irregular heart beat    PT STATES IT SKIPS A BEAT    Obesity    Personal history of chemotherapy    Personal history of radiation therapy     Past Surgical History:  Procedure Laterality Date   ANKLE SURGERY     BREAST BIOPSY Right 02/28/2017   INVASIVE MAMMARY CARCINOMA WITH MUCINOUS FEATURES.   BREAST CYST ASPIRATION Right    NEG   COLONOSCOPY WITH PROPOFOL  N/A 06/25/2017   Procedure: COLONOSCOPY WITH PROPOFOL ;  Surgeon: Therisa Bi, MD;  Location: [facility] ENDOSCOPY;  Service: Gastroenterology;  Laterality: N/A;   MASTECTOMY, PARTIAL Right 07/30/2017   Procedure: MASTECTOMY PARTIAL;  Surgeon: Dessa Reyes ORN, MD;  Location: [facility] ORS;  Service: General;  Laterality: Right;   PORTACATH PLACEMENT Left 03/15/2017   Procedure: INSERTION PORT-A-CATH;  Surgeon: Reyes ORN Dessa, MD;  Location: [facility] ORS;  Service: General;  Laterality: Left;   RE-EXCISION OF BREAST LUMPECTOMY Right 08/17/2017    INVASIVE CARCINOMA EXTENDS TO NEW LATERAL MARGIN. /RE-EXCISION OF BREAST LUMPECTOMY;: Byrnett, Reyes ORN, MD;  [facility] ORS; General;  Laterality: Right;   SENTINEL NODE BIOPSY Right 07/30/2017   Procedure: SENTINEL NODE BIOPSY;  Surgeon: Dessa Reyes ORN, MD;  Location: [facility] ORS;  Service: General;  Laterality: Right;   SIMPLE MASTECTOMY WITH AXILLARY SENTINEL NODE BIOPSY Right 09/28/2017   Procedure: SIMPLE MASTECTOMY;  Surgeon: Dessa Reyes ORN, MD;  Location: [facility] ORS;  Service: General;  Laterality: Right;    Family History  Problem Relation Age of Onset   Diabetes Father    Stroke Father    Hypertension Father    Hypertension Mother    Brain cancer Maternal Aunt 37  Diabetes Sister    Colon cancer Neg Hx    Breast cancer Neg Hx     Social History:  reports that she quit smoking about 6 years ago. Her smoking use included cigarettes. She started smoking about 31 years ago. She has a  6.3 pack-year smoking history. She has quit using smokeless tobacco. She reports that she does not currently use alcohol. She reports that she does not use drugs.  Allergies:  Allergies  Allergen Reactions   Other Hives and Itching    [name]     Current Medications: Current Outpatient Medications  Medication Sig Dispense Refill   aspirin  EC 81 MG tablet Take 1 tablet (81 mg total) by mouth daily. Swallow whole.     atorvastatin  (LIPITOR) 10 MG tablet TAKE ONE TABLET (10 MG TOTAL) BY MOUTH DAILY AT 5PM PLEASE SCHEDULE AN OFFICE VISIT BEFORE ANYMORE REFILLS 90 tablet 1   Calcium  Carbonate-Vitamin D  (CALTRATE 600+D PO) Take by mouth.     cyanocobalamin  (VITAMIN B12) 1000 MCG tablet Take 1 tablet (1,000 mcg total) by mouth daily. 90 tablet 1   ferrous sulfate 325 (65 FE) MG tablet Take 325 mg by mouth 2 (two) times daily with a meal.     gabapentin  (NEURONTIN ) 400 MG capsule Take 1 capsule (400 mg total) by mouth at bedtime. 180 capsule 0   lisinopril  (ZESTRIL ) 5 MG tablet TAKE ONE TABLET (5 MG TOTAL) BY MOUTH DAILY AT 9AM PLEASE SCHEDULE AN OFFICE VISIT BEFORE ANYMORE REFILLS 90 tablet 1   metFORMIN  (GLUCOPHAGE ) 500 MG tablet TAKE TWO TABLETS BY MOUTH DAILY AT 9AM WITH BREAKFAST and TAKE ONE TABLET BY MOUTH DAILY AT 5PM WITH SUPPER. PLEASE SCHEDULE AN OFFICE VISIT BEFORE ANYMORE REFILLS 270 tablet 1   potassium chloride  (KLOR-CON ) 10 MEQ tablet TAKE ONE TABLET BY MOUTH TWICE DAILY @ 9AM & 5PM. PLEASE SCHEDULE AN OFFICE VISIT BEFORE ANYMORE REFILLS 180 tablet 1   triamterene -hydrochlorothiazide (MAXZIDE-25) 37.5-25 MG tablet TAKE ONE TABLET BY MOUTH DAILY AT 9AM PLEASE SCHEDULE AN OFFICE VISIT BEFORE ANYMORE REFILLS 90 tablet 1   letrozole  (FEMARA ) 2.5 MG tablet Take 1 tablet (2.5 mg total) by mouth daily. 100 tablet 1   No current facility-administered medications for this visit.

## 2024-08-12 NOTE — Assessment & Plan Note (Signed)
 C-Met and lipid profile today Encouraged her to consume a low-fat diet Continue atorvastatin  10 mg daily Advised her to start aspirin  81 mg daily

## 2024-08-12 NOTE — Assessment & Plan Note (Signed)
 A1C and urine microalbumin today Encouraged her to consume a low-carb diet Continue metformin  1000 mg in am, 500 mg in pm Encouraged routine eye exam Encouraged routine foot exam She declines immunizations

## 2024-08-12 NOTE — Patient Instructions (Signed)
 Atherosclerosis  Atherosclerosis is when plaque builds up in the arteries. This causes narrowing and hardening of the arteries. Arteries are blood vessels that carry blood from the heart to all parts of the body. This blood contains oxygen. Plaque occurs due to inflammation or from a buildup of fat, cholesterol, calcium, waste products of cells, and a clotting material in the blood (fibrin). Plaque decreases the amount of blood that can flow through the artery. Atherosclerosis can affect any artery in your body, including: Heart arteries. Damage to these arteries may lead to coronary artery disease, which can cause a heart attack. Brain arteries. Damage to these arteries may cause a stroke. Leg, arm, and pelvis arteries. Peripheral artery disease (PAD) may result from damage to these arteries. Kidney arteries. Kidney (renal) failure may result from damage to kidney arteries. Treatment may slow the disease and prevent further damage to your heart, brain, peripheral arteries, and kidneys. What are the causes? This condition develops slowly over many years. The inner layers of your arteries become damaged and allow the gradual buildup of plaque. The exact cause of atherosclerosis is not fully understood. Symptoms of atherosclerosis do not occur until an artery becomes narrow or blocked. What increases the risk? The following factors may make you more likely to develop this condition: Being middle-aged or older. Certain medical conditions, including: High blood pressure. High cholesterol. High blood fats (triglycerides). Diabetes. Sleep apnea. Obesity. Certain lab levels, including: Elevated C-reactive protein (CRP). This is a sign of increased inflammation in your body. Elevated homocysteine levels. This is an amino acid that is associated with heart and blood vessel disease. Using tobacco or nicotine products. A family history of atherosclerosis. Not exercising enough (sedentary  lifestyle). Being stressed. Drinking too much alcohol or using drugs, such as cocaine or methamphetamine. What are the signs or symptoms? Symptoms of atherosclerosis do not occur until the plaque severely narrows or blocks the artery, which decreases blood flow. Sometimes, atherosclerosis does not cause symptoms. Symptoms of this condition include: Coronary artery disease. This may cause chest pain and shortness of breath. Decreased blood supply to your brain, which may cause a stroke. Signs of a stroke may include sudden: Weakness or numbness in your face, arm, or leg, especially on one side of your body. Trouble walking or difficulty moving your arms or legs. Loss of balance or coordination. Confusion. Slurred speech. Trouble speaking, or trouble understanding speech, or both (aphasia). Vision changes in one or both eyes. This may be double vision, blurred vision, or loss of vision. Severe headache with no known cause. The headache is often described as the worst headache ever experienced. PAD, which may cause pain, numbness, or nonhealing wounds, often in your legs and hips. Renal failure. This may cause tiredness, problems with urination, swelling, and itchy skin. How is this diagnosed? This condition is diagnosed based on your medical history and a physical exam. During the exam, your health care provider will: Check your pulse in different places. Listen for a "whooshing" sound over your arteries (bruit). You may also have tests, such as: Blood tests to check your levels of cholesterol, triglycerides, blood sugar, and CRP. Ankle-brachial index to compare blood pressure in your arms to blood pressure in your ankles to see how your blood is flowing. Heart (cardiac) tests. Electrocardiogram (ECG) to check for heart damage. Stress test to see how your heart reacts to exercise. Ultrasound tests. Ultrasound of your peripheral arteries to check blood flow. Echocardiogram to get images of  your heart's  chambers and valves. X-ray tests. Chest X-ray to see if you have an enlarged heart, which is a sign of heart failure. CT scan to check for damage to your heart, brain, or arteries. Angiogram. This is a test where dye is injected and X-rays are used to see the blood flow in the arteries. How is this treated? This condition is treated with lifestyle changes as the first step. These may include: Changing your diet. Losing weight. Reducing stress. Exercising and being physically active more regularly. Quitting smoking. You may also need medicine to: Lower triglycerides and cholesterol. Control blood pressure. Prevent blood clots. Lower inflammation in your body. Control your blood sugar. Sometimes, surgery is needed to: Remove plaque from an artery (endarterectomy). Open or widen a narrowed heart artery or peripheral artery (angioplasty). Create a new path for your blood with one of these procedures: Heart (coronary) artery bypass graft surgery. Peripheral artery bypass graft surgery. Place a small mesh tube (stent) in an artery to open or widen a narrowed artery. Follow these instructions at home: Eating and drinking  Eat a heart-healthy diet. Talk with your health care provider or a dietitian if you need help. A heart-healthy diet involves: Limiting unhealthy fats and increasing healthy fats. Some examples of healthy fats are avocados and olive oil. Eating plant-based foods, such as fruits, vegetables, nuts, whole grains, and legumes (such as peas and lentils). If you drink alcohol: Limit how much you have to: 0-1 drink a day for women who are not pregnant. 0-2 drinks a day for men. Know how much alcohol is in a drink. In the [place] one drink equals one 12 oz bottle of beer (355 mL), one 5 oz glass of wine (148 mL), or one 1 oz glass of hard liquor (44 mL). Lifestyle  Maintain a healthy weight. Lose weight if your health care provider says that you need to do  that. Follow an exercise program as told by your health care provider. Do not use any products that contain nicotine or tobacco. These products include cigarettes, chewing tobacco, and vaping devices, such as e-cigarettes. If you need help quitting, ask your health care provider. Do not use drugs. General instructions Take over-the-counter and prescription medicines only as told by your health care provider. Manage other health conditions as told. Keep all follow-up visits. This is important. Contact a health care provider if you have: An irregular heartbeat. Unexplained tiredness (fatigue). Trouble urinating, or you are producing less urine or foamy urine. Swelling of your hands or feet, or itchy skin. Unexplained pain or numbness in your legs or hips. A wound that is slow to heal or is not healing. Get help right away if: You have any symptoms of a heart attack. These may be: Chest pain. This includes squeezing chest pain that may feel like indigestion (angina). Shortness of breath. Pain in your neck, jaw, arms, back, or stomach. Cold sweat. Nausea. Light-headedness. Sudden pain, numbness, or coldness in a limb. You have any symptoms of a stroke. "BE FAST" is an easy way to remember the main warning signs of a stroke: B - Balance. Signs are dizziness, sudden trouble walking, or loss of balance. E - Eyes. Signs are trouble seeing or a sudden change in vision. F - Face. Signs are sudden weakness or numbness of the face, or the face or eyelid drooping on one side. A - Arms. Signs are weakness or numbness in an arm. This happens suddenly and usually on one side of the body. S -  Speech. Signs are sudden trouble speaking, slurred speech, or trouble understanding what people say. T - Time. Time to call emergency services. Write down what time symptoms started. You have other signs of a stroke, such as: A sudden, severe headache with no known cause. Nausea or vomiting. Seizure. These  symptoms may represent a serious problem that is an emergency. Do not wait to see if the symptoms will go away. Get medical help right away. Call your local emergency services (911 in the [place] Do not drive yourself to the hospital. Summary Atherosclerosis is when plaque builds up in the arteries and causes narrowing and hardening of the arteries. Plaque occurs due to inflammation or from a buildup of fat, cholesterol, calcium, cellular waste products, and fibrin. This condition may not cause any symptoms. Symptoms of atherosclerosis do not occur until the plaque severely narrows or blocks the artery. Treatment starts with lifestyle changes and may include medicines. In some cases, surgery is needed. Get help right away if you have any symptoms of a heart attack or stroke. This information is not intended to replace advice given to you by your health care provider. Make sure you discuss any questions you have with your health care provider. Document Revised: 02/09/2021 Document Reviewed: 02/09/2021 Elsevier Patient Education  2024 [employer].

## 2024-08-12 NOTE — Assessment & Plan Note (Signed)
 C-Met and lipid profile today Encouraged her to consume a low-fat diet Continue atorvastatin  10 mg daily

## 2024-08-12 NOTE — Assessment & Plan Note (Signed)
 Continue gabapentin  400 mg at bedtime We will monitor

## 2024-08-12 NOTE — Assessment & Plan Note (Signed)
 CBC and iron  panel today Continue oral iron  325 mg daily

## 2024-08-12 NOTE — Assessment & Plan Note (Addendum)
 Lab Results  Component Value Date   HGB 10.4 (L) 08/12/2024   TIBC 238 (L) 08/12/2024   IRONPCTSAT 17 08/12/2024   FERRITIN 165 08/12/2024   Iron  panel is not typical for iron  deficiency.  Vitamin B12 level has improved. Multiple myeloma panel and light chain ratio levels are pending.

## 2024-08-12 NOTE — Assessment & Plan Note (Signed)
 In remission, on letrozole  2.5 mg daily She will continue to follow with oncology

## 2024-08-12 NOTE — Assessment & Plan Note (Signed)
 Controlled on triamterene  HCTZ  37.5-25 mg, potassium 20 meq and lisinopril  5 mg daily Reinforced DASH diet and exercise for weight loss C-Met today

## 2024-08-13 ENCOUNTER — Ambulatory Visit: Payer: Self-pay | Admitting: Internal Medicine

## 2024-08-13 DIAGNOSIS — Z170 Estrogen receptor positive status [ER+]: Secondary | ICD-10-CM | POA: Diagnosis not present

## 2024-08-13 DIAGNOSIS — C50411 Malignant neoplasm of upper-outer quadrant of right female breast: Secondary | ICD-10-CM | POA: Diagnosis not present

## 2024-08-13 LAB — COMPREHENSIVE METABOLIC PANEL WITH GFR
AG Ratio: 1.2 (calc) (ref 1.0–2.5)
ALT: 24 U/L (ref 6–29)
AST: 16 U/L (ref 10–35)
Albumin: 3.6 g/dL (ref 3.6–5.1)
Alkaline phosphatase (APISO): 141 U/L (ref 37–153)
BUN: 16 mg/dL (ref 7–25)
CO2: 29 mmol/L (ref 20–32)
Calcium: 9.8 mg/dL (ref 8.6–10.4)
Chloride: 105 mmol/L (ref 98–110)
Creat: 0.83 mg/dL (ref 0.50–1.03)
Globulin: 3 g/dL (ref 1.9–3.7)
Glucose, Bld: 156 mg/dL — ABNORMAL HIGH (ref 65–139)
Potassium: 4.1 mmol/L (ref 3.5–5.3)
Sodium: 140 mmol/L (ref 135–146)
Total Bilirubin: 0.3 mg/dL (ref 0.2–1.2)
Total Protein: 6.6 g/dL (ref 6.1–8.1)
eGFR: 83 mL/min/1.73m2 (ref >=60)

## 2024-08-13 LAB — PROTEIN ELECTROPHORESIS, SERUM
A/G Ratio: 0.9 (ref 0.7–1.7)
Albumin ELP: 3 g/dL (ref 2.9–4.4)
Alpha-1-Globulin: 0.3 g/dL (ref 0.0–0.4)
Alpha-2-Globulin: 0.7 g/dL (ref 0.4–1.0)
Beta Globulin: 0.9 g/dL (ref 0.7–1.3)
Gamma Globulin: 1.5 g/dL (ref 0.4–1.8)
Globulin, Total: 3.4 g/dL (ref 2.2–3.9)
Total Protein ELP: 6.4 g/dL (ref 6.0–8.5)

## 2024-08-13 LAB — CBC
HCT: 35.9 % (ref 35.0–45.0)
Hemoglobin: 11.2 g/dL — ABNORMAL LOW (ref 11.7–15.5)
MCH: 24.7 pg — ABNORMAL LOW (ref 27.0–33.0)
MCHC: 31.2 g/dL — ABNORMAL LOW (ref 32.0–36.0)
MCV: 79.1 fL — ABNORMAL LOW (ref 80.0–100.0)
MPV: 9.6 fL (ref 7.5–12.5)
Platelets: 294 Thousand/uL (ref 140–400)
RBC: 4.54 Million/uL (ref 3.80–5.10)
RDW: 15 % (ref 11.0–15.0)
WBC: 6.3 Thousand/uL (ref 3.8–10.8)

## 2024-08-13 LAB — HEMOGLOBIN A1C
Hgb A1c MFr Bld: 6.2 % — ABNORMAL HIGH (ref <5.7)
Mean Plasma Glucose: 131 mg/dL
eAG (mmol/L): 7.3 mmol/L

## 2024-08-13 LAB — KAPPA/LAMBDA LIGHT CHAINS
Kappa free light chain: 53.4 mg/L — ABNORMAL HIGH (ref 3.3–19.4)
Kappa, lambda light chain ratio: 1.48 (ref 0.26–1.65)
Lambda free light chains: 36.1 mg/L — ABNORMAL HIGH (ref 5.7–26.3)

## 2024-08-13 LAB — IRON,TIBC AND FERRITIN PANEL
%SAT: 17 % (ref 16–45)
Ferritin: 207 ng/mL (ref 16–232)
Iron: 40 ug/dL — ABNORMAL LOW (ref 45–160)
TIBC: 240 ug/dL — ABNORMAL LOW (ref 250–450)

## 2024-08-13 LAB — LIPID PANEL
Cholesterol: 135 mg/dL (ref <200)
HDL: 36 mg/dL — ABNORMAL LOW (ref >=50)
LDL Cholesterol (Calc): 82 mg/dL
Non-HDL Cholesterol (Calc): 99 mg/dL (ref <130)
Total CHOL/HDL Ratio: 3.8 (calc) (ref <5.0)
Triglycerides: 85 mg/dL (ref <150)

## 2024-08-14 ENCOUNTER — Ambulatory Visit: Admitting: Internal Medicine

## 2024-09-02 NOTE — Progress Notes (Signed)
 Chelsea Hudson                                          MRN: [id number]   09/02/2024   The [facility] Quality Team Specialist reviewed this patient medical record for the purposes of chart review for care gap closure. The following were reviewed: chart review for care gap closure-diabetic eye exam.  Noncompliant positive diabetic retinopathy in 2024.    [facility] Quality Team

## 2024-09-05 ENCOUNTER — Other Ambulatory Visit: Payer: Self-pay | Admitting: Internal Medicine

## 2024-09-05 DIAGNOSIS — E876 Hypokalemia: Secondary | ICD-10-CM

## 2024-09-05 DIAGNOSIS — E1165 Type 2 diabetes mellitus with hyperglycemia: Secondary | ICD-10-CM

## 2024-09-05 DIAGNOSIS — E1169 Type 2 diabetes mellitus with other specified complication: Secondary | ICD-10-CM

## 2024-09-05 DIAGNOSIS — I10 Essential (primary) hypertension: Secondary | ICD-10-CM

## 2024-09-08 NOTE — Telephone Encounter (Signed)
 Requested Prescriptions  Pending Prescriptions Disp Refills   metFORMIN  (GLUCOPHAGE ) 500 MG tablet [Pharmacy Med Name: metformin  500 mg tablet] 270 tablet 0    Sig: TAKE TWO TABLETS BY MOUTH WITH BREAKFAST DAILY AT 9AM and TAKE ONE TABLET BY MOUTH WITH SUPPER DAILY AT 5PM. PLEASE SCHEDULE AN OFFICE VISIT BEFORE ANYMORE REFILLS     Endocrinology:  Diabetes - Biguanides Passed - 09/08/2024 11:40 AM      Passed - Cr in normal range and within 360 days    Creat  Date Value Ref Range Status  08/12/2024 0.83 0.50 - 1.03 mg/dL Final   Creatinine, Ser  Date Value Ref Range Status  08/12/2024 0.86 0.44 - 1.00 mg/dL Final   Creatinine, Urine  Date Value Ref Range Status  02/11/2024 53 20 - 275 mg/dL Final         Passed - HBA1C is between 0 and 7.9 and within 180 days    HbA1c, POC (controlled diabetic range)  Date Value Ref Range Status  08/14/2023 7.6 (A) 0.0 - 7.0 % Final   Hgb A1c MFr Bld  Date Value Ref Range Status  08/12/2024 6.2 (H) <5.7 % Final    Comment:    For someone without known diabetes, a hemoglobin  A1c value between 5.7% and 6.4% is consistent with prediabetes and should be confirmed with a  follow-up test. . For someone with known diabetes, a value <7% indicates that their diabetes is well controlled. A1c targets should be individualized based on duration of diabetes, age, comorbid conditions, and other considerations. . This assay result is consistent with an increased risk of diabetes. . Currently, no consensus exists regarding use of hemoglobin A1c for diagnosis of diabetes for children. .          Passed - eGFR in normal range and within 360 days    GFR, Est African American  Date Value Ref Range Status  03/22/2020 89 > OR = 60 mL/min/1.15m2 Final   GFR, Est Non African American  Date Value Ref Range Status  03/22/2020 77 > OR = 60 mL/min/1.57m2 Final   GFR, Estimated  Date Value Ref Range Status  08/12/2024 >60 >60 mL/min Final    Comment:     (NOTE) Calculated using the CKD-EPI Creatinine Equation (2021)   01/29/2024 >60 >60 mL/min Final    Comment:    (NOTE) Calculated using the CKD-EPI Creatinine Equation (2021)    eGFR  Date Value Ref Range Status  08/12/2024 83 > OR = 60 mL/min/1.45m2 Final         Passed - B12 Level in normal range and within 720 days    Vitamin B-12  Date Value Ref Range Status  08/12/2024 585 180 - 914 pg/mL Final    Comment:    (NOTE) This assay is not validated for testing neonatal or myeloproliferative syndrome specimens for Vitamin B12 levels. Performed at [facility], [address] [place], [place] [address]          Passed - Valid encounter within last 6 months    Recent Outpatient Visits           3 weeks ago Controlled type 2 diabetes mellitus with hyperglycemia, without long-term current use of insulin  [facility])   [place] [facility] [place], Angeline ORN, NP   7 months ago Encounter for general adult medical examination with abnormal findings   [place] [facility] [place], Angeline ORN, NP  Passed - CBC within normal limits and completed in the last 12 months    WBC  Date Value Ref Range Status  08/12/2024 6.5 4.0 - 10.5 K/uL Final   RBC  Date Value Ref Range Status  08/12/2024 4.29 3.87 - 5.11 MIL/uL Final   RBC.  Date Value Ref Range Status  08/12/2024 4.28 3.87 - 5.11 MIL/uL Final   Hemoglobin  Date Value Ref Range Status  08/12/2024 10.4 (L) 12.0 - 15.0 g/dL Final    Comment:    Reticulocyte Hemoglobin testing may be clinically indicated, consider ordering this additional test [id number]   01/29/2024 10.0 (L) 12.0 - 15.0 g/dL Final    Comment:    Reticulocyte Hemoglobin testing may be clinically indicated, consider ordering this additional test [id number]    HCT  Date Value Ref Range Status  08/12/2024 33.5 (L) 36.0 - 46.0 % Final   MCHC  Date Value Ref Range Status  08/12/2024 31.0 30.0 - 36.0  g/dL Final   [facility]  Date Value Ref Range Status  08/12/2024 24.2 (L) 26.0 - 34.0 pg Final   MCV  Date Value Ref Range Status  08/12/2024 78.1 (L) 80.0 - 100.0 fL Final   No results found for: PLTCOUNTKUC, LABPLAT, POCPLA RDW  Date Value Ref Range Status  08/12/2024 15.6 (H) 11.5 - 15.5 % Final          lisinopril  (ZESTRIL ) 5 MG tablet [Pharmacy Med Name: lisinopril  5 mg tablet] 90 tablet 0    Sig: TAKE ONE TABLET (5 MG TOTAL) BY MOUTH DAILY AT 9AM PLEASE SCHEDULE AN OFFICE VISIT BEFORE ANYMORE REFILLS     Cardiovascular:  ACE Inhibitors Passed - 09/08/2024 11:40 AM      Passed - Cr in normal range and within 180 days    Creat  Date Value Ref Range Status  08/12/2024 0.83 0.50 - 1.03 mg/dL Final   Creatinine, Ser  Date Value Ref Range Status  08/12/2024 0.86 0.44 - 1.00 mg/dL Final   Creatinine, Urine  Date Value Ref Range Status  02/11/2024 53 20 - 275 mg/dL Final         Passed - K in normal range and within 180 days    Potassium  Date Value Ref Range Status  08/12/2024 3.7 3.5 - 5.1 mmol/L Final         Passed - Patient is not pregnant      Passed - Last BP in normal range    BP Readings from Last 1 Encounters:  08/12/24 126/82         Passed - Valid encounter within last 6 months    Recent Outpatient Visits           3 weeks ago Controlled type 2 diabetes mellitus with hyperglycemia, without long-term current use of insulin  [facility])   [place] [facility] [place], Angeline ORN, NP   7 months ago Encounter for general adult medical examination with abnormal findings   [place] [facility] [place], Angeline ORN, NP               atorvastatin  (LIPITOR) 10 MG tablet [Pharmacy Med Name: atorvastatin  10 mg tablet] 90 tablet 0    Sig: TAKE ONE TABLET (10 MG TOTAL) BY MOUTH DAILY AT 5PM PLEASE SCHEDULE AN OFFICE VISIT BEFORE ANYMORE REFILLS     Cardiovascular:  Antilipid - Statins Failed - 09/08/2024 11:40 AM      Failed -  Lipid Panel in normal range  within the last 12 months    Cholesterol, Total  Date Value Ref Range Status  03/01/2021 168 100 - 199 mg/dL Final   Cholesterol  Date Value Ref Range Status  08/12/2024 135 <200 mg/dL Final   LDL Cholesterol (Calc)  Date Value Ref Range Status  08/12/2024 82 mg/dL (calc) Final    Comment:    Reference range: <100 . Desirable range <100 mg/dL for primary prevention;   <70 mg/dL for patients with CHD or diabetic patients  with > or = 2 CHD risk factors. SABRA LDL-C is now calculated using the Martin-Hopkins  calculation, which is a validated novel method providing  better accuracy than the Friedewald equation in the  estimation of LDL-C.  Gladis APPLETHWAITE et al. SANDREA. 7986;689(80): 2061-2068  (http://education.[employer].com/faq/FAQ164)    HDL  Date Value Ref Range Status  08/12/2024 36 (L) > OR = 50 mg/dL Final  95/87/7977 33 (L) >39 mg/dL Final   Triglycerides  Date Value Ref Range Status  08/12/2024 85 <150 mg/dL Final         Passed - Patient is not pregnant      Passed - Valid encounter within last 12 months    Recent Outpatient Visits           3 weeks ago Controlled type 2 diabetes mellitus with hyperglycemia, without long-term current use of insulin  [facility])   [place] [facility] [place], [place], NP   7 months ago Encounter for general adult medical examination with abnormal findings   [place] [facility] [place], Angeline ORN, NP               potassium chloride  (KLOR-CON ) 10 MEQ tablet [Pharmacy Med Name: potassium chloride  ER 10 mEq tablet,extended release] 180 tablet 0    Sig: TAKE ONE TABLET BY MOUTH TWICE DAILY AT 9AM & 5PM PLEASE SCHEDULE AN OFFICE VISIT FOR REFILLS     Endocrinology:  Minerals - Potassium Supplementation Passed - 09/08/2024 11:40 AM      Passed - K in normal range and within 360 days    Potassium  Date Value Ref Range Status  08/12/2024 3.7 3.5 - 5.1 mmol/L Final          Passed - Cr in normal range and within 360 days    Creat  Date Value Ref Range Status  08/12/2024 0.83 0.50 - 1.03 mg/dL Final   Creatinine, Ser  Date Value Ref Range Status  08/12/2024 0.86 0.44 - 1.00 mg/dL Final   Creatinine, Urine  Date Value Ref Range Status  02/11/2024 53 20 - 275 mg/dL Final         Passed - Valid encounter within last 12 months    Recent Outpatient Visits           3 weeks ago Controlled type 2 diabetes mellitus with hyperglycemia, without long-term current use of insulin  [facility])   [place] [facility] [place], Angeline ORN, NP   7 months ago Encounter for general adult medical examination with abnormal findings   [facility] [place], [place], NP               triamterene -hydrochlorothiazide (MAXZIDE-25) 37.5-25 MG tablet [Pharmacy Med Name: triamterene  37.5 mg-hydrochlorothiazide 25 mg tablet] 90 tablet 0    Sig: TAKE ONE TABLET BY MOUTH DAILY AT 9AM PLEASE SCHEDULE AN OFFICE VISIT BEFORE ANYMORE REFILLS     Cardiovascular: Diuretic Combos Passed - 09/08/2024 11:40 AM  Passed - K in normal range and within 180 days    Potassium  Date Value Ref Range Status  08/12/2024 3.7 3.5 - 5.1 mmol/L Final         Passed - Na in normal range and within 180 days    Sodium  Date Value Ref Range Status  08/12/2024 135 135 - 145 mmol/L Final         Passed - Cr in normal range and within 180 days    Creat  Date Value Ref Range Status  08/12/2024 0.83 0.50 - 1.03 mg/dL Final   Creatinine, Ser  Date Value Ref Range Status  08/12/2024 0.86 0.44 - 1.00 mg/dL Final   Creatinine, Urine  Date Value Ref Range Status  02/11/2024 53 20 - 275 mg/dL Final         Passed - Last BP in normal range    BP Readings from Last 1 Encounters:  08/12/24 126/82         Passed - Valid encounter within last 6 months    Recent Outpatient Visits           3 weeks ago Controlled type 2 diabetes mellitus with  hyperglycemia, without long-term current use of insulin  [facility])   [place] [facility] [place], Angeline ORN, NP   7 months ago Encounter for general adult medical examination with abnormal findings   [place] [facility] [place], Angeline ORN, NP

## 2024-09-10 ENCOUNTER — Other Ambulatory Visit: Payer: Self-pay | Admitting: Oncology

## 2024-09-15 ENCOUNTER — Encounter: Attending: Physician Assistant | Admitting: Physician Assistant

## 2024-09-15 DIAGNOSIS — Z7984 Long term (current) use of oral hypoglycemic drugs: Secondary | ICD-10-CM | POA: Diagnosis not present

## 2024-09-15 DIAGNOSIS — E11622 Type 2 diabetes mellitus with other skin ulcer: Secondary | ICD-10-CM | POA: Diagnosis not present

## 2024-09-15 DIAGNOSIS — Z853 Personal history of malignant neoplasm of breast: Secondary | ICD-10-CM | POA: Diagnosis not present

## 2024-09-15 DIAGNOSIS — E1143 Type 2 diabetes mellitus with diabetic autonomic (poly)neuropathy: Secondary | ICD-10-CM | POA: Insufficient documentation

## 2024-09-15 DIAGNOSIS — L97322 Non-pressure chronic ulcer of left ankle with fat layer exposed: Secondary | ICD-10-CM | POA: Diagnosis not present

## 2024-09-15 DIAGNOSIS — I87332 Chronic venous hypertension (idiopathic) with ulcer and inflammation of left lower extremity: Secondary | ICD-10-CM | POA: Insufficient documentation

## 2024-09-18 ENCOUNTER — Encounter

## 2024-09-18 DIAGNOSIS — E1143 Type 2 diabetes mellitus with diabetic autonomic (poly)neuropathy: Secondary | ICD-10-CM | POA: Diagnosis not present

## 2024-09-18 DIAGNOSIS — E11622 Type 2 diabetes mellitus with other skin ulcer: Secondary | ICD-10-CM | POA: Diagnosis not present

## 2024-09-18 DIAGNOSIS — L97322 Non-pressure chronic ulcer of left ankle with fat layer exposed: Secondary | ICD-10-CM | POA: Diagnosis not present

## 2024-09-18 DIAGNOSIS — I87332 Chronic venous hypertension (idiopathic) with ulcer and inflammation of left lower extremity: Secondary | ICD-10-CM | POA: Diagnosis not present

## 2024-09-18 DIAGNOSIS — Z853 Personal history of malignant neoplasm of breast: Secondary | ICD-10-CM | POA: Diagnosis not present

## 2024-09-23 ENCOUNTER — Encounter: Attending: Physician Assistant | Admitting: Physician Assistant

## 2024-09-23 DIAGNOSIS — Z853 Personal history of malignant neoplasm of breast: Secondary | ICD-10-CM | POA: Insufficient documentation

## 2024-09-23 DIAGNOSIS — L97322 Non-pressure chronic ulcer of left ankle with fat layer exposed: Secondary | ICD-10-CM | POA: Insufficient documentation

## 2024-09-23 DIAGNOSIS — I10 Essential (primary) hypertension: Secondary | ICD-10-CM | POA: Insufficient documentation

## 2024-09-23 DIAGNOSIS — I87332 Chronic venous hypertension (idiopathic) with ulcer and inflammation of left lower extremity: Secondary | ICD-10-CM | POA: Insufficient documentation

## 2024-09-23 DIAGNOSIS — E11622 Type 2 diabetes mellitus with other skin ulcer: Secondary | ICD-10-CM | POA: Insufficient documentation

## 2024-09-23 DIAGNOSIS — E1143 Type 2 diabetes mellitus with diabetic autonomic (poly)neuropathy: Secondary | ICD-10-CM | POA: Insufficient documentation

## 2024-09-30 ENCOUNTER — Encounter: Admitting: Physician Assistant

## 2024-09-30 DIAGNOSIS — L97322 Non-pressure chronic ulcer of left ankle with fat layer exposed: Secondary | ICD-10-CM | POA: Diagnosis not present

## 2024-09-30 DIAGNOSIS — E11622 Type 2 diabetes mellitus with other skin ulcer: Secondary | ICD-10-CM | POA: Diagnosis not present

## 2024-09-30 DIAGNOSIS — I87332 Chronic venous hypertension (idiopathic) with ulcer and inflammation of left lower extremity: Secondary | ICD-10-CM | POA: Diagnosis not present

## 2024-10-07 ENCOUNTER — Encounter: Admitting: Physician Assistant

## 2024-10-07 DIAGNOSIS — E11622 Type 2 diabetes mellitus with other skin ulcer: Secondary | ICD-10-CM | POA: Diagnosis not present

## 2024-10-14 ENCOUNTER — Encounter: Admitting: Physician Assistant

## 2024-10-14 DIAGNOSIS — E11622 Type 2 diabetes mellitus with other skin ulcer: Secondary | ICD-10-CM | POA: Diagnosis not present

## 2024-10-23 ENCOUNTER — Encounter: Attending: Physician Assistant | Admitting: Physician Assistant

## 2024-10-23 DIAGNOSIS — E11622 Type 2 diabetes mellitus with other skin ulcer: Secondary | ICD-10-CM | POA: Insufficient documentation

## 2024-10-23 DIAGNOSIS — I10 Essential (primary) hypertension: Secondary | ICD-10-CM | POA: Insufficient documentation

## 2024-10-23 DIAGNOSIS — Z953 Presence of xenogenic heart valve: Secondary | ICD-10-CM | POA: Diagnosis not present

## 2024-10-23 DIAGNOSIS — I87332 Chronic venous hypertension (idiopathic) with ulcer and inflammation of left lower extremity: Secondary | ICD-10-CM | POA: Insufficient documentation

## 2024-10-23 DIAGNOSIS — E1143 Type 2 diabetes mellitus with diabetic autonomic (poly)neuropathy: Secondary | ICD-10-CM | POA: Diagnosis not present

## 2024-10-23 DIAGNOSIS — L97322 Non-pressure chronic ulcer of left ankle with fat layer exposed: Secondary | ICD-10-CM | POA: Insufficient documentation

## 2024-11-13 ENCOUNTER — Other Ambulatory Visit: Payer: Self-pay | Admitting: Internal Medicine

## 2024-11-13 DIAGNOSIS — E1165 Type 2 diabetes mellitus with hyperglycemia: Secondary | ICD-10-CM

## 2024-11-14 ENCOUNTER — Other Ambulatory Visit: Payer: Self-pay | Admitting: Internal Medicine

## 2024-11-14 DIAGNOSIS — E876 Hypokalemia: Secondary | ICD-10-CM

## 2024-11-14 DIAGNOSIS — E1169 Type 2 diabetes mellitus with other specified complication: Secondary | ICD-10-CM

## 2024-11-14 DIAGNOSIS — I10 Essential (primary) hypertension: Secondary | ICD-10-CM

## 2024-11-14 NOTE — Telephone Encounter (Signed)
 Requested Prescriptions  Pending Prescriptions Disp Refills   metFORMIN  (GLUCOPHAGE ) 500 MG tablet [Pharmacy Med Name: metformin  500 mg tablet] 270 tablet 0    Sig: TAKE TWO TABLETS WITH BREAKFAST DAILY AT 9AM and TAKE ONE TABLET BY MOUTH DAILY AT 5PM WITH SUPPER     Endocrinology:  Diabetes - Biguanides Passed - 11/14/2024  6:47 PM      Passed - Cr in normal range and within 360 days    Creat  Date Value Ref Range Status  08/12/2024 0.83 0.50 - 1.03 mg/dL Final   Creatinine, Ser  Date Value Ref Range Status  08/12/2024 0.86 0.44 - 1.00 mg/dL Final   Creatinine, Urine  Date Value Ref Range Status  02/11/2024 53 20 - 275 mg/dL Final         Passed - HBA1C is between 0 and 7.9 and within 180 days    HbA1c, POC (controlled diabetic range)  Date Value Ref Range Status  08/14/2023 7.6 (A) 0.0 - 7.0 % Final   Hgb A1c MFr Bld  Date Value Ref Range Status  08/12/2024 6.2 (H) <5.7 % Final    Comment:    For someone without known diabetes, a hemoglobin  A1c value between 5.7% and 6.4% is consistent with prediabetes and should be confirmed with a  follow-up test. . For someone with known diabetes, a value <7% indicates that their diabetes is well controlled. A1c targets should be individualized based on duration of diabetes, age, comorbid conditions, and other considerations. . This assay result is consistent with an increased risk of diabetes. . Currently, no consensus exists regarding use of hemoglobin A1c for diagnosis of diabetes for children. .          Passed - eGFR in normal range and within 360 days    GFR, Est African American  Date Value Ref Range Status  03/22/2020 89 > OR = 60 mL/min/1.29m2 Final   GFR, Est Non African American  Date Value Ref Range Status  03/22/2020 77 > OR = 60 mL/min/1.57m2 Final   GFR, Estimated  Date Value Ref Range Status  08/12/2024 >60 >60 mL/min Final    Comment:    (NOTE) Calculated using the CKD-EPI Creatinine Equation  (2021)   01/29/2024 >60 >60 mL/min Final    Comment:    (NOTE) Calculated using the CKD-EPI Creatinine Equation (2021)    eGFR  Date Value Ref Range Status  08/12/2024 83 > OR = 60 mL/min/1.42m2 Final         Passed - B12 Level in normal range and within 720 days    Vitamin B-12  Date Value Ref Range Status  08/12/2024 585 180 - 914 pg/mL Final    Comment:    (NOTE) This assay is not validated for testing neonatal or myeloproliferative syndrome specimens for Vitamin B12 levels. Performed at [facility], [address], [place], [place] [address]          Passed - Valid encounter within last 6 months    Recent Outpatient Visits           3 months ago Controlled type 2 diabetes mellitus with hyperglycemia, without long-term current use of insulin  [facility])   [place] [facility] [place], Angeline ORN, NP   9 months ago Encounter for general adult medical examination with abnormal findings   [place] [facility] [place], Angeline ORN, NP  Passed - CBC within normal limits and completed in the last 12 months    WBC  Date Value Ref Range Status  08/12/2024 6.5 4.0 - 10.5 K/uL Final   RBC  Date Value Ref Range Status  08/12/2024 4.29 3.87 - 5.11 MIL/uL Final   RBC.  Date Value Ref Range Status  08/12/2024 4.28 3.87 - 5.11 MIL/uL Final   Hemoglobin  Date Value Ref Range Status  08/12/2024 10.4 (L) 12.0 - 15.0 g/dL Final    Comment:    Reticulocyte Hemoglobin testing may be clinically indicated, consider ordering this additional test [id number]   01/29/2024 10.0 (L) 12.0 - 15.0 g/dL Final    Comment:    Reticulocyte Hemoglobin testing may be clinically indicated, consider ordering this additional test [id number]    HCT  Date Value Ref Range Status  08/12/2024 33.5 (L) 36.0 - 46.0 % Final   MCHC  Date Value Ref Range Status  08/12/2024 31.0 30.0 - 36.0 g/dL Final   [facility]  Date Value Ref Range Status  08/12/2024  24.2 (L) 26.0 - 34.0 pg Final   MCV  Date Value Ref Range Status  08/12/2024 78.1 (L) 80.0 - 100.0 fL Final   No results found for: PLTCOUNTKUC, LABPLAT, POCPLA RDW  Date Value Ref Range Status  08/12/2024 15.6 (H) 11.5 - 15.5 % Final          lisinopril  (ZESTRIL ) 5 MG tablet [Pharmacy Med Name: lisinopril  5 mg tablet] 90 tablet 0    Sig: Take 1 tablet (5 mg total) by mouth daily.     Cardiovascular:  ACE Inhibitors Passed - 11/14/2024  6:47 PM      Passed - Cr in normal range and within 180 days    Creat  Date Value Ref Range Status  08/12/2024 0.83 0.50 - 1.03 mg/dL Final   Creatinine, Ser  Date Value Ref Range Status  08/12/2024 0.86 0.44 - 1.00 mg/dL Final   Creatinine, Urine  Date Value Ref Range Status  02/11/2024 53 20 - 275 mg/dL Final         Passed - K in normal range and within 180 days    Potassium  Date Value Ref Range Status  08/12/2024 3.7 3.5 - 5.1 mmol/L Final         Passed - Patient is not pregnant      Passed - Last BP in normal range    BP Readings from Last 1 Encounters:  08/12/24 126/82         Passed - Valid encounter within last 6 months    Recent Outpatient Visits           3 months ago Controlled type 2 diabetes mellitus with hyperglycemia, without long-term current use of insulin  [facility])   [place] [facility] [place], Angeline ORN, NP   9 months ago Encounter for general adult medical examination with abnormal findings   [place] [facility] [place], Angeline ORN, NP

## 2024-11-17 NOTE — Telephone Encounter (Signed)
 Requested Prescriptions  Pending Prescriptions Disp Refills   atorvastatin  (LIPITOR) 10 MG tablet [Pharmacy Med Name: atorvastatin  10 mg tablet] 90 tablet 0    Sig: TAKE ONE TABLET (10 MG TOTAL) BY MOUTH DAILY AT 5PM PLEASE SCHEDULE AN OFFICE VISIT BEFORE ANYMORE REFILLS     Cardiovascular:  Antilipid - Statins Failed - 11/17/2024  9:01 AM      Failed - Lipid Panel in normal range within the last 12 months    Cholesterol, Total  Date Value Ref Range Status  03/01/2021 168 100 - 199 mg/dL Final   Cholesterol  Date Value Ref Range Status  08/12/2024 135 <200 mg/dL Final   LDL Cholesterol (Calc)  Date Value Ref Range Status  08/12/2024 82 mg/dL (calc) Final    Comment:    Reference range: <100 . Desirable range <100 mg/dL for primary prevention;   <70 mg/dL for patients with CHD or diabetic patients  with > or = 2 CHD risk factors. SABRA LDL-C is now calculated using the Martin-Hopkins  calculation, which is a validated novel method providing  better accuracy than the Friedewald equation in the  estimation of LDL-C.  Gladis APPLETHWAITE et al. SANDREA. 7986;689(80): 2061-2068  (http://education.[facility].com/faq/FAQ164)    HDL  Date Value Ref Range Status  08/12/2024 36 (L) > OR = 50 mg/dL Final  95/87/7977 33 (L) >39 mg/dL Final   Triglycerides  Date Value Ref Range Status  08/12/2024 85 <150 mg/dL Final         Passed - Patient is not pregnant      Passed - Valid encounter within last 12 months    Recent Outpatient Visits           3 months ago Controlled type 2 diabetes mellitus with hyperglycemia, without long-term current use of insulin  [facility])   [place] [facility] [place], [place], NP   9 months ago Encounter for general adult medical examination with abnormal findings   [place] [facility] [place], Angeline ORN, NP               triamterene -hydrochlorothiazide (MAXZIDE-25) 37.5-25 MG tablet [Pharmacy Med Name: triamterene  37.5  mg-hydrochlorothiazide 25 mg tablet] 90 tablet 0    Sig: TAKE ONE TABLET BY MOUTH DAILY AT 9AM PLEASE SCHEDULE AN OFFICE VISIT BEFORE ANYMORE REFILLS     Cardiovascular: Diuretic Combos Passed - 11/17/2024  9:01 AM      Passed - K in normal range and within 180 days    Potassium  Date Value Ref Range Status  08/12/2024 3.7 3.5 - 5.1 mmol/L Final         Passed - Na in normal range and within 180 days    Sodium  Date Value Ref Range Status  08/12/2024 135 135 - 145 mmol/L Final         Passed - Cr in normal range and within 180 days    Creat  Date Value Ref Range Status  08/12/2024 0.83 0.50 - 1.03 mg/dL Final   Creatinine, Ser  Date Value Ref Range Status  08/12/2024 0.86 0.44 - 1.00 mg/dL Final   Creatinine, Urine  Date Value Ref Range Status  02/11/2024 53 20 - 275 mg/dL Final         Passed - Last BP in normal range    BP Readings from Last 1 Encounters:  08/12/24 126/82         Passed - Valid encounter within last 6 months  Recent Outpatient Visits           3 months ago Controlled type 2 diabetes mellitus with hyperglycemia, without long-term current use of insulin  [facility])   [place] [facility] [place], Angeline ORN, NP   9 months ago Encounter for general adult medical examination with abnormal findings   [place] [facility] [place], Angeline ORN, NP               potassium chloride  (KLOR-CON ) 10 MEQ tablet [Pharmacy Med Name: potassium chloride  ER 10 mEq tablet,extended release] 180 tablet 0    Sig: TAKE ONE TABLET BY MOUTH TWICE DAILY AT 9AM & 5PM PLEASE SCHEDULE AN OFFICE VISIT FOR REFILLS     Endocrinology:  Minerals - Potassium Supplementation Passed - 11/17/2024  9:01 AM      Passed - K in normal range and within 360 days    Potassium  Date Value Ref Range Status  08/12/2024 3.7 3.5 - 5.1 mmol/L Final         Passed - Cr in normal range and within 360 days    Creat  Date Value Ref Range Status  08/12/2024 0.83  0.50 - 1.03 mg/dL Final   Creatinine, Ser  Date Value Ref Range Status  08/12/2024 0.86 0.44 - 1.00 mg/dL Final   Creatinine, Urine  Date Value Ref Range Status  02/11/2024 53 20 - 275 mg/dL Final         Passed - Valid encounter within last 12 months    Recent Outpatient Visits           3 months ago Controlled type 2 diabetes mellitus with hyperglycemia, without long-term current use of insulin  [facility])   [place] [facility] [place], Angeline ORN, NP   9 months ago Encounter for general adult medical examination with abnormal findings   [place] [facility] [place], Angeline ORN, NP

## 2024-12-03 ENCOUNTER — Telehealth: Admitting: Internal Medicine

## 2024-12-03 ENCOUNTER — Encounter: Payer: Self-pay | Admitting: Internal Medicine

## 2024-12-03 DIAGNOSIS — R051 Acute cough: Secondary | ICD-10-CM

## 2024-12-03 DIAGNOSIS — Z20828 Contact with and (suspected) exposure to other viral communicable diseases: Secondary | ICD-10-CM

## 2024-12-03 DIAGNOSIS — R490 Dysphonia: Secondary | ICD-10-CM

## 2024-12-03 DIAGNOSIS — R6889 Other general symptoms and signs: Secondary | ICD-10-CM

## 2024-12-03 MED ORDER — PROMETHAZINE-DM 6.25-15 MG/5ML PO SYRP: 5.0000 mL | ORAL_SOLUTION | Freq: Every evening | ORAL | 0 refills | Status: AC | PRN

## 2024-12-03 MED ORDER — BENZONATATE 100 MG PO CAPS: 100.0000 mg | ORAL_CAPSULE | Freq: Three times a day (TID) | ORAL | 0 refills | Status: AC | PRN

## 2024-12-03 MED ORDER — CETIRIZINE HCL 10 MG PO TABS: 10.0000 mg | ORAL_TABLET | Freq: Every day | ORAL | 0 refills | Status: AC

## 2024-12-03 NOTE — Patient Instructions (Signed)
 Hoarseness  Hoarseness, also called dysphonia, is any abnormal change in your voice that can make it difficult to speak. Your voice may sound raspy, breathy, or strained. Hoarseness is caused by a problem with your vocal cords (vocal folds). These are two bands of tissue inside your voice box (larynx). When you speak, your vocal cords move back and forth to create sound. The surfaces of your vocal cords need to be smooth for your voice to sound clear. Swelling or lumps on your vocal cords can cause hoarseness. Vocal cord problems may be the result of injuries or abnormal growths, certain diseases, upper respiratory infection, or allergies. Other causes may include medicine side effects and exposure to irritants. Follow these instructions at home:  Pay attention to any changes in your symptoms. Take these actions to stay safe and to help relieve your symptoms: Lifestyle Do not eat foods that give you heartburn, such as spicy or acidic foods like hot peppers and orange juice. These foods can cause a gastroesophageal reflux that may worsen your vocal cord problems. Limit how much alcohol and caffeine you drink as told by your health care provider. Drink enough fluid to keep your urine pale yellow. Do not use any products that contain nicotine or tobacco. These products include cigarettes, chewing tobacco, and vaping devices, such as e-cigarettes. If you need help quitting, ask your health care provider. Avoid secondhand smoke. General instructions Use a humidifier if the air in your home is dry. Avoid coughing or clearing your throat. Do not whisper. Whispering can cause muscle strain. Do not speak in a loud or harsh voice. Rest your voice. If recommended by your health care provider, schedule an appointment with a speech-language specialist. This specialist may give you methods to try that can help you avoid misusing your voice. Contact a health care provider if: Your voice is hoarse longer than  2 weeks. You almost lose or completely lose your voice for more than 3 days. You have pain when you swallow or try to talk. You feel a lump in your neck. Get help right away if: You have trouble swallowing. You feel like you are choking when you swallow. You cough up blood or vomit blood. You have trouble breathing. You choke, cannot swallow, or cannot breathe if you lie flat. You notice swelling or a rash on your body, face, or tongue. These symptoms may represent a serious problem that is an emergency. Do not wait to see if the symptoms will go away. Get medical help right away. Call your local emergency services (911 in the [place] Do not drive yourself to the hospital. Summary Hoarseness, also called dysphonia, is any abnormal change in your voice that can make it difficult to speak. Your voice may sound raspy, breathy, or strained. Hoarseness is caused by a problem with your vocal cords (vocal folds). Do not speak in a loud or harsh voice, whisper, use nicotine or tobacco products, or eat foods that give you heartburn. See your health care provider if your hoarseness does not improve after 2 weeks. This information is not intended to replace advice given to you by your health care provider. Make sure you discuss any questions you have with your health care provider. Document Revised: 04/26/2021 Document Reviewed: 04/26/2021 Elsevier Patient Education  2024 [employer].

## 2024-12-03 NOTE — Progress Notes (Signed)
 "  Subjective:    Patient ID: Chelsea Hudson, female    DOB: [birth year], 59 y.o.   MRN: [id number]   Virtual Visit via Video Note  I connected with Chelsea Hudson on 12/03/2024 at 11:40 AM EST by a video enabled telemedicine application and verified that I am speaking with the correct person using two identifiers.  Location: Patient: At work Provider: Herbalist participating in this video call: Angeline Laura, NP-C and Seona Clemenson   I discussed the limitations of evaluation and management by telemedicine and the availability of in person appointments. The patient expressed understanding and agreed to proceed.  History of Present Illness:   Discussed the use of AI scribe software for clinical note transcription with the patient, who gave verbal consent to proceed.  Chelsea Hudson is a 59 year old female who presents with persistent cough and hoarseness following flu-like symptoms.  Her goddaughter was diagnosed with the flu last weekend, and she subsequently developed flulike symptoms.  Most of the symptoms have improved however she reports a persistent cough and loss of voice.  The cough is productive of clear mucus.  She is unsure if she has run a fever but she has had chills and bodyaches.  She has been taking an over-the-counter medication from [facility] for cold and flu symptoms. She wants additional medication to address her hoarseness and throat discomfort.     Past Medical History:  Diagnosis Date   Arthritis    SHOULDER   Breast cancer (HCC) 02/2017   rt breast   Breast cancer (HCC) 2018   Cancer (HCC) 02/28/2017   INVASIVE MAMMARY CARCINOMA WITH MUCINOUS FEATURES.   Colonic diverticular abscess 06/21/2017   Colonoscopy 06/25/2017: No evidence of malignancy.   Diabetes mellitus without complication (HCC)    Hypertension    Irregular heart beat    PT STATES IT SKIPS A BEAT    Obesity    Personal history of chemotherapy    Personal history of radiation therapy      Current Outpatient Medications  Medication Sig Dispense Refill   aspirin  EC 81 MG tablet Take 1 tablet (81 mg total) by mouth daily. Swallow whole.     atorvastatin  (LIPITOR) 10 MG tablet TAKE ONE TABLET (10 MG TOTAL) BY MOUTH DAILY AT 5PM PLEASE SCHEDULE AN OFFICE VISIT BEFORE ANYMORE REFILLS 90 tablet 0   Calcium  Carbonate-Vitamin D  (CALTRATE 600+D PO) Take by mouth.     cyanocobalamin  (VITAMIN B12) 1000 MCG tablet TAKE ONE TABLET BY MOUTH DAILY AT 9AM 90 tablet 1   ferrous sulfate 325 (65 FE) MG tablet Take 325 mg by mouth 2 (two) times daily with a meal.     gabapentin  (NEURONTIN ) 400 MG capsule Take 1 capsule (400 mg total) by mouth at bedtime. 180 capsule 0   letrozole  (FEMARA ) 2.5 MG tablet Take 1 tablet (2.5 mg total) by mouth daily. 100 tablet 1   lisinopril  (ZESTRIL ) 5 MG tablet Take 1 tablet (5 mg total) by mouth daily. 90 tablet 0   metFORMIN  (GLUCOPHAGE ) 500 MG tablet TAKE TWO TABLETS WITH BREAKFAST DAILY AT 9AM and TAKE ONE TABLET BY MOUTH DAILY AT 5PM WITH SUPPER 270 tablet 0   potassium chloride  (KLOR-CON ) 10 MEQ tablet TAKE ONE TABLET BY MOUTH TWICE DAILY AT 9AM & 5PM PLEASE SCHEDULE AN OFFICE VISIT FOR REFILLS 180 tablet 0   triamterene -hydrochlorothiazide (MAXZIDE-25) 37.5-25 MG tablet TAKE ONE TABLET BY MOUTH DAILY AT 9AM PLEASE SCHEDULE AN OFFICE VISIT BEFORE ANYMORE  REFILLS 90 tablet 0   No current facility-administered medications for this visit.    Allergies[1]  Family History  Problem Relation Age of Onset   Diabetes Father    Stroke Father    Hypertension Father    Hypertension Mother    Brain cancer Maternal Aunt 73   Diabetes Sister    Colon cancer Neg Hx    Breast cancer Neg Hx     Social History   Socioeconomic History   Marital status: Married    Spouse name: Not on file   Number of children: Not on file   Years of education: Not on file   Highest education level: Not on file  Occupational History   Not on file  Tobacco Use   Smoking  status: Former    Current packs/day: 0.00    Average packs/day: 0.3 packs/day for 25.0 years (6.3 ttl pk-yrs)    Types: Cigarettes    Start date: 10/20/1992    Quit date: 10/20/2017    Years since quitting: 7.1   Smokeless tobacco: Former  Building Services Engineer status: Never Used  Substance and Sexual Activity   Alcohol use: Not Currently    Comment: occas   Drug use: No   Sexual activity: Not Currently  Other Topics Concern   Not on file  Social History Narrative   Not on file   Social Drivers of Health   Tobacco Use: Medium Risk (10/29/2024)   Received from [facility]   Patient History    Smoking Tobacco Use: Former    Smokeless Tobacco Use: Never    Passive Exposure: Not on file  Financial Resource Strain: Low Risk (03/14/2024)   Overall Financial Resource Strain (CARDIA)    Difficulty of Paying Living Expenses: Not hard at all  Food Insecurity: No Food Insecurity (03/14/2024)   Hunger Vital Sign    Worried About Running Out of Food in the Last Year: Never true    Ran Out of Food in the Last Year: Never true  Transportation Needs: No Transportation Needs (03/14/2024)   PRAPARE - Administrator, Civil Service (Medical): No    Lack of Transportation (Non-Medical): No  Physical Activity: Inactive (03/14/2024)   Exercise Vital Sign    Days of Exercise per Week: 0 days    Minutes of Exercise per Session: 0 min  Stress: No Stress Concern Present (03/14/2024)   [employer] of Occupational Health - Occupational Stress Questionnaire    Feeling of Stress : Not at all  Social Connections: Socially Integrated (03/14/2024)   Social Connection and Isolation Panel    Frequency of Communication with Friends and Family: More than three times a week    Frequency of Social Gatherings with Friends and Family: Twice a week    Attends Religious Services: More than 4 times per year    Active Member of Clubs or Organizations: Yes    Attends Banker Meetings:  More than 4 times per year    Marital Status: Married  Catering Manager Violence: Not At Risk (03/14/2024)   Humiliation, Afraid, Rape, and Kick questionnaire    Fear of Current or Ex-Partner: No    Emotionally Abused: No    Physically Abused: No    Sexually Abused: No  Depression (PHQ2-9): Low Risk (08/12/2024)   Depression (PHQ2-9)    PHQ-2 Score: 0  Alcohol Screen: Low Risk (03/14/2024)   Alcohol Screen    Last Alcohol Screening Score (AUDIT): 0  Housing: Unknown (03/14/2024)   Housing Stability Vital Sign    Unable to Pay for Housing in the Last Year: No    Number of Times Moved in the Last Year: Not on file    Homeless in the Last Year: No  Utilities: Not At Risk (03/14/2024)   [facility] Utilities    Threatened with loss of utilities: No  Health Literacy: Adequate Health Literacy (03/14/2024)   B1300 Health Literacy    Frequency of need for help with medical instructions: Never     Constitutional: Denies fever, malaise, fatigue, headache or abrupt weight changes.  HEENT: Pt reports hoarseness. Denies eye pain, eye redness, ear pain, ringing in the ears, wax buildup, runny nose, nasal congestion, bloody nose, or sore throat. Respiratory: Patient reports cough.  Denies difficulty breathing, shortness of breath, or sputum production.   Cardiovascular: Denies chest pain, chest tightness, palpitations or swelling in the hands or feet.  Gastrointestinal: Denies abdominal pain, bloating, constipation, diarrhea or blood in the stool.  GU: Denies urgency, frequency, pain with urination, burning sensation, blood in urine, odor or discharge. Musculoskeletal: Patient reports chronic joint pain.  Denies decrease in range of motion, difficulty with gait, muscle pain or joint swelling.  Skin: Denies redness, rashes, lesions or ulcercations.  Neurological: Patient reports neuropathic pain.  Denies dizziness, difficulty with memory, difficulty with speech or problems with balance and coordination.   Psych: Denies anxiety, depression, SI/HI.  No other specific complaints in a complete review of systems (except as listed in HPI above).  Observations/Objective:  LMP 01/31/2014 Comment: LMP was 3 years ago. Wt Readings from Last 3 Encounters:  08/12/24 190 lb 11.2 oz (86.5 kg)  08/12/24 191 lb 6.4 oz (86.8 kg)  02/11/24 193 lb 3.2 oz (87.6 kg)    General: Appears her stated age, well developed, well nourished in NAD. HEENT: Head: normal shape and size; Nose: No congestion noted; Throat/Mouth: Hoarseness noted Pulmonary/Chest: Normal effort. No respiratory distress.  Neurological: Alert and oriented.   BMET    Component Value Date/Time   NA 135 08/12/2024 1400   K 3.7 08/12/2024 1400   CL 103 08/12/2024 1400   CO2 25 08/12/2024 1400   GLUCOSE 126 (H) 08/12/2024 1400   BUN 18 08/12/2024 1400   CREATININE 0.86 08/12/2024 1400   CREATININE 0.83 08/12/2024 1042   CALCIUM  9.2 08/12/2024 1400   GFRNONAA >60 08/12/2024 1400   GFRNONAA >60 01/29/2024 1315   GFRNONAA 77 03/22/2020 0802   GFRAA 89 03/22/2020 0802    Lipid Panel     Component Value Date/Time   CHOL 135 08/12/2024 1042   CHOL 168 03/01/2021 1649   TRIG 85 08/12/2024 1042   HDL 36 (L) 08/12/2024 1042   HDL 33 (L) 03/01/2021 1649   CHOLHDL 3.8 08/12/2024 1042   VLDL 23 04/04/2017 0941   LDLCALC 82 08/12/2024 1042    CBC    Component Value Date/Time   WBC 6.5 08/12/2024 1400   RBC 4.28 08/12/2024 1400   RBC 4.29 08/12/2024 1400   HGB 10.4 (L) 08/12/2024 1400   HGB 10.0 (L) 01/29/2024 1315   HCT 33.5 (L) 08/12/2024 1400   PLT 276 08/12/2024 1400   PLT 295 01/29/2024 1315   MCV 78.1 (L) 08/12/2024 1400   MCH 24.2 (L) 08/12/2024 1400   MCHC 31.0 08/12/2024 1400   RDW 15.6 (H) 08/12/2024 1400   LYMPHSABS 1.9 08/12/2024 1400   MONOABS 0.4 08/12/2024 1400   EOSABS 0.1 08/12/2024 1400   BASOSABS  0.0 08/12/2024 1400    Hgb A1C Lab Results  Component Value Date   HGBA1C 6.2 (H) 08/12/2024        Assessment and Plan:  Assessment and Plan    Persistent hoarseness, cough status post flulike symptoms due to exposure of the flu Symptoms suggest viral etiology, likely influenza exposure. Productive cough with clear sputum, no fever.  No indication for antiviral therapy at this time - Prescribed Zyrtec  10 mg daily x 2 weeks for throat discomfort. - Prescribed Promethazine  DM cough syrup for nighttime-sedation caution given. - Prescribed Tessalon  100 mg cough tablets 3 times daily as needed for daytime.   RTC in 2 months for your annual exam  Follow Up Instructions:    I discussed the assessment and treatment plan with the patient. The patient was provided an opportunity to ask questions and all were answered. The patient agreed with the plan and demonstrated an understanding of the instructions.   The patient was advised to call back or seek an in-person evaluation if the symptoms worsen or if the condition fails to improve as anticipated.   Angeline Laura, NP     [1]  Allergies Allergen Reactions   Other Hives and Itching    [name]    "

## 2024-12-04 ENCOUNTER — Encounter: Payer: Self-pay | Admitting: Internal Medicine

## 2025-01-27 ENCOUNTER — Other Ambulatory Visit

## 2025-01-27 ENCOUNTER — Ambulatory Visit: Admitting: Oncology

## 2025-02-10 ENCOUNTER — Other Ambulatory Visit

## 2025-02-10 ENCOUNTER — Ambulatory Visit: Admitting: Oncology

## 2025-02-12 ENCOUNTER — Encounter: Admitting: Internal Medicine

## 2025-03-20 ENCOUNTER — Ambulatory Visit

## 2025-03-25 ENCOUNTER — Ambulatory Visit
